# Patient Record
Sex: Female | Born: 1955
Health system: Southern US, Community
[De-identification: ages and names within clinical notes are randomized; demographics above are authoritative.]

## PROBLEM LIST (undated history)

## (undated) DIAGNOSIS — K5900 Constipation, unspecified: Secondary | ICD-10-CM

## (undated) DIAGNOSIS — M1990 Unspecified osteoarthritis, unspecified site: Secondary | ICD-10-CM

## (undated) DIAGNOSIS — G20A1 Parkinson's disease without dyskinesia, without mention of fluctuations: Secondary | ICD-10-CM

## (undated) DIAGNOSIS — R011 Cardiac murmur, unspecified: Secondary | ICD-10-CM

## (undated) DIAGNOSIS — I10 Essential (primary) hypertension: Secondary | ICD-10-CM

## (undated) DIAGNOSIS — F32A Depression, unspecified: Secondary | ICD-10-CM

## (undated) DIAGNOSIS — Z87442 Personal history of urinary calculi: Secondary | ICD-10-CM

## (undated) DIAGNOSIS — C4490 Unspecified malignant neoplasm of skin, unspecified: Secondary | ICD-10-CM

## (undated) DIAGNOSIS — Z86010 Personal history of colonic polyps: Secondary | ICD-10-CM

## (undated) DIAGNOSIS — H269 Unspecified cataract: Secondary | ICD-10-CM

## (undated) DIAGNOSIS — F410 Panic disorder [episodic paroxysmal anxiety] without agoraphobia: Secondary | ICD-10-CM

## (undated) DIAGNOSIS — I2510 Atherosclerotic heart disease of native coronary artery without angina pectoris: Secondary | ICD-10-CM

## (undated) DIAGNOSIS — U071 COVID-19: Secondary | ICD-10-CM

## (undated) DIAGNOSIS — S32050A Wedge compression fracture of fifth lumbar vertebra, initial encounter for closed fracture: Secondary | ICD-10-CM

## (undated) DIAGNOSIS — Z720 Tobacco use: Secondary | ICD-10-CM

## (undated) DIAGNOSIS — F329 Major depressive disorder, single episode, unspecified: Secondary | ICD-10-CM

## (undated) DIAGNOSIS — K219 Gastro-esophageal reflux disease without esophagitis: Secondary | ICD-10-CM

## (undated) DIAGNOSIS — T7840XA Allergy, unspecified, initial encounter: Secondary | ICD-10-CM

## (undated) DIAGNOSIS — E7800 Pure hypercholesterolemia, unspecified: Secondary | ICD-10-CM

## (undated) DIAGNOSIS — J449 Chronic obstructive pulmonary disease, unspecified: Secondary | ICD-10-CM

## (undated) DIAGNOSIS — N951 Menopausal and female climacteric states: Secondary | ICD-10-CM

## (undated) HISTORY — DX: Unspecified cataract: H26.9

## (undated) HISTORY — DX: Pure hypercholesterolemia, unspecified: E78.00

## (undated) HISTORY — DX: Atherosclerotic heart disease of native coronary artery without angina pectoris: I25.10

## (undated) HISTORY — PX: OTHER SURGICAL HISTORY: SHX169

## (undated) HISTORY — DX: Panic disorder (episodic paroxysmal anxiety): F41.0

## (undated) HISTORY — DX: COVID-19: U07.1

## (undated) HISTORY — PX: BACK SURGERY: SHX140

## (undated) HISTORY — DX: Unspecified malignant neoplasm of skin, unspecified: C44.90

## (undated) HISTORY — DX: Unspecified osteoarthritis, unspecified site: M19.90

## (undated) HISTORY — PX: ABDOMINAL HYSTERECTOMY: SHX81

## (undated) HISTORY — PX: UPPER GASTROINTESTINAL ENDOSCOPY: SHX188

## (undated) HISTORY — DX: Constipation, unspecified: K59.00

## (undated) HISTORY — DX: Tobacco use: Z72.0

## (undated) HISTORY — DX: Personal history of colonic polyps: Z86.010

## (undated) HISTORY — DX: Cardiac murmur, unspecified: R01.1

## (undated) HISTORY — DX: Major depressive disorder, single episode, unspecified: F32.9

## (undated) HISTORY — DX: Allergy, unspecified, initial encounter: T78.40XA

## (undated) HISTORY — PX: COLONOSCOPY: SHX174

## (undated) HISTORY — DX: Menopausal and female climacteric states: N95.1

## (undated) HISTORY — DX: Depression, unspecified: F32.A

## (undated) HISTORY — DX: Gastro-esophageal reflux disease without esophagitis: K21.9

---

## 1999-10-29 ENCOUNTER — Encounter (INDEPENDENT_AMBULATORY_CARE_PROVIDER_SITE_OTHER): Payer: Self-pay

## 1999-10-29 ENCOUNTER — Ambulatory Visit (HOSPITAL_COMMUNITY): Admission: RE | Admit: 1999-10-29 | Discharge: 1999-10-29 | Payer: Self-pay | Admitting: Gastroenterology

## 2001-01-22 ENCOUNTER — Ambulatory Visit (HOSPITAL_COMMUNITY): Admission: RE | Admit: 2001-01-22 | Discharge: 2001-01-22 | Payer: Self-pay | Admitting: Family Medicine

## 2001-01-22 ENCOUNTER — Encounter: Payer: Self-pay | Admitting: Family Medicine

## 2003-06-05 DIAGNOSIS — C4492 Squamous cell carcinoma of skin, unspecified: Secondary | ICD-10-CM

## 2003-06-05 DIAGNOSIS — C4491 Basal cell carcinoma of skin, unspecified: Secondary | ICD-10-CM

## 2003-06-05 DIAGNOSIS — D229 Melanocytic nevi, unspecified: Secondary | ICD-10-CM

## 2003-06-05 HISTORY — DX: Melanocytic nevi, unspecified: D22.9

## 2003-06-05 HISTORY — DX: Basal cell carcinoma of skin, unspecified: C44.91

## 2003-06-05 HISTORY — DX: Squamous cell carcinoma of skin, unspecified: C44.92

## 2003-07-26 DIAGNOSIS — D229 Melanocytic nevi, unspecified: Secondary | ICD-10-CM

## 2003-07-26 HISTORY — DX: Melanocytic nevi, unspecified: D22.9

## 2004-10-24 ENCOUNTER — Ambulatory Visit: Payer: Self-pay | Admitting: Cardiology

## 2006-05-15 ENCOUNTER — Other Ambulatory Visit: Admission: RE | Admit: 2006-05-15 | Discharge: 2006-05-15 | Payer: Self-pay | Admitting: Family Medicine

## 2006-06-22 ENCOUNTER — Observation Stay (HOSPITAL_COMMUNITY): Admission: EM | Admit: 2006-06-22 | Discharge: 2006-06-23 | Payer: Self-pay | Admitting: Emergency Medicine

## 2006-06-22 ENCOUNTER — Ambulatory Visit: Payer: Self-pay | Admitting: Cardiology

## 2006-07-20 ENCOUNTER — Encounter: Payer: Self-pay | Admitting: Cardiology

## 2006-07-20 ENCOUNTER — Ambulatory Visit: Payer: Self-pay | Admitting: Physician Assistant

## 2007-10-22 ENCOUNTER — Encounter: Payer: Self-pay | Admitting: Cardiology

## 2007-10-27 ENCOUNTER — Ambulatory Visit: Payer: Self-pay | Admitting: Cardiology

## 2007-11-11 ENCOUNTER — Ambulatory Visit (HOSPITAL_COMMUNITY): Payer: Self-pay | Admitting: Psychiatry

## 2007-11-24 ENCOUNTER — Ambulatory Visit (HOSPITAL_COMMUNITY): Payer: Self-pay | Admitting: Psychiatry

## 2008-11-10 ENCOUNTER — Encounter: Payer: Self-pay | Admitting: Cardiology

## 2008-11-10 DIAGNOSIS — F329 Major depressive disorder, single episode, unspecified: Secondary | ICD-10-CM

## 2008-11-10 DIAGNOSIS — F3289 Other specified depressive episodes: Secondary | ICD-10-CM | POA: Insufficient documentation

## 2008-11-10 DIAGNOSIS — M1990 Unspecified osteoarthritis, unspecified site: Secondary | ICD-10-CM

## 2008-11-10 DIAGNOSIS — E7800 Pure hypercholesterolemia, unspecified: Secondary | ICD-10-CM | POA: Insufficient documentation

## 2008-11-10 DIAGNOSIS — F17200 Nicotine dependence, unspecified, uncomplicated: Secondary | ICD-10-CM

## 2008-11-10 DIAGNOSIS — I10 Essential (primary) hypertension: Secondary | ICD-10-CM

## 2008-11-13 ENCOUNTER — Ambulatory Visit: Payer: Self-pay | Admitting: Cardiology

## 2008-11-13 DIAGNOSIS — G4700 Insomnia, unspecified: Secondary | ICD-10-CM

## 2008-11-13 DIAGNOSIS — M79609 Pain in unspecified limb: Secondary | ICD-10-CM

## 2008-11-15 ENCOUNTER — Encounter: Payer: Self-pay | Admitting: Cardiology

## 2008-12-04 ENCOUNTER — Encounter (INDEPENDENT_AMBULATORY_CARE_PROVIDER_SITE_OTHER): Payer: Self-pay | Admitting: *Deleted

## 2009-01-07 ENCOUNTER — Encounter: Payer: Self-pay | Admitting: Cardiology

## 2009-05-22 ENCOUNTER — Ambulatory Visit: Payer: Self-pay | Admitting: Cardiology

## 2009-05-22 DIAGNOSIS — R0789 Other chest pain: Secondary | ICD-10-CM

## 2009-08-21 ENCOUNTER — Telehealth (INDEPENDENT_AMBULATORY_CARE_PROVIDER_SITE_OTHER): Payer: Self-pay | Admitting: *Deleted

## 2009-08-23 ENCOUNTER — Ambulatory Visit (HOSPITAL_COMMUNITY): Admission: RE | Admit: 2009-08-23 | Discharge: 2009-08-23 | Payer: Self-pay | Admitting: Family Medicine

## 2009-08-30 ENCOUNTER — Ambulatory Visit (HOSPITAL_COMMUNITY): Admission: RE | Admit: 2009-08-30 | Discharge: 2009-08-30 | Payer: Self-pay | Admitting: Family Medicine

## 2009-10-10 ENCOUNTER — Encounter: Payer: Self-pay | Admitting: Cardiology

## 2009-10-10 ENCOUNTER — Telehealth (INDEPENDENT_AMBULATORY_CARE_PROVIDER_SITE_OTHER): Payer: Self-pay | Admitting: *Deleted

## 2009-10-10 ENCOUNTER — Encounter (INDEPENDENT_AMBULATORY_CARE_PROVIDER_SITE_OTHER): Payer: Self-pay | Admitting: *Deleted

## 2009-10-11 ENCOUNTER — Inpatient Hospital Stay (HOSPITAL_COMMUNITY): Admission: EM | Admit: 2009-10-11 | Discharge: 2009-10-11 | Payer: Self-pay | Admitting: Emergency Medicine

## 2009-10-11 ENCOUNTER — Encounter (INDEPENDENT_AMBULATORY_CARE_PROVIDER_SITE_OTHER): Payer: Self-pay | Admitting: *Deleted

## 2009-10-11 ENCOUNTER — Encounter: Payer: Self-pay | Admitting: Cardiology

## 2009-10-11 ENCOUNTER — Ambulatory Visit: Payer: Self-pay | Admitting: Cardiology

## 2009-10-16 ENCOUNTER — Telehealth (INDEPENDENT_AMBULATORY_CARE_PROVIDER_SITE_OTHER): Payer: Self-pay | Admitting: *Deleted

## 2009-12-14 ENCOUNTER — Ambulatory Visit: Payer: Self-pay | Admitting: Cardiology

## 2010-04-02 NOTE — Progress Notes (Signed)
Summary: breathing issue  Phone Note Call from Patient   Summary of Call: Pt. called c/o fluid and not breathing good.  Stated she did see her PMD Paulita Cradle) on Friday for these issues and she ordered sonogram on belly for Thursday.  Stated that a friend of hers give her a fluid pill of hers and she said she lost about 7 lbs.  Advised her that it is not a good idea to take other people's meds because they may not be what she needs or agree with other meds that she may be taking.  Advised her to call PMD back to inform her of this as she has already started addressing the problem.  Also, advised her that she should call PMD this afternoon about this info discussed above.  If her PMD feels like this is a cardiac issue, then she can call office back to request appt.  If symptoms worsern, ER.  Patient verbalized understanding.  Initial call taken by: Hoover Brunette, LPN,  August 21, 2009 4:28 PM  Follow-up for Phone Call        Agree Lewayne Bunting, MD, [facility]  August 22, 2009 10:39 AM

## 2010-04-02 NOTE — Letter (Signed)
Summary: [facility] D/C DR.DONALD MOORE/DR. KRISHAN  [facility] D/C DR.DONALD MOORE/DR. KRISHAN   Imported By: Zachary George 12/14/2009 13:45:15  _____________________________________________________________________  External Attachment:    Type:   Image     Comment:   External Document

## 2010-04-02 NOTE — Assessment & Plan Note (Signed)
Summary: elevated bp  --agh   Visit Type:  Follow-up Primary [name]:  Kerry Cradle, NP, [facility]  CC:  Blood pressure running high.  History of Present Illness: the patient is a 55 year old female with chronic dyspnea secondary to be a COPD associated with chronic cough. Has atypical chest pain. She was recently seen in endocrine emergency room because of uncontrolled hypertension. An MRI of the head was done which was essentially normal had an echocardiogram done which was normal also a carotid Doppler lung which were within normal limits.  She presents now for followup. She denies any chest pain orthopnea PND. She continues to have dyspnea and exertion but continues to smoke a pack a day.  Preventive Screening-Counseling & Management  Alcohol-Tobacco     Smoking Status: current     Packs/Day: 1.5-2.0  Comments: smoked for about 40 yrs  Current Medications (verified): 1)  Aspirin 81 Mg  Tabs (Aspirin) .... Daily 2)  Lisinopril 20 Mg Tabs (Lisinopril) .... Daily 3)  Vitamin E 600 Unit  Caps (Vitamin E) .... Daily 4)  Clonazepam .... As Needed 5)  Cymbalta 60 Mg Cpep (Duloxetine Hcl) .... Take 1 Tablet By Mouth Once A Day 6)  Lipitor 40 Mg Tabs (Atorvastatin Calcium) .... Take One Tablet By Mouth At Bedtime 7)  Fish Oil 1000 Mg Caps (Omega-3 Fatty Acids) .... Take 1 Capsule By Mouth Once A Day 8)  Trazodone Hcl 50 Mg Tabs (Trazodone Hcl) .... Take 1 Tab By Mouth At Bedtime  Allergies: No Known Drug Allergies  Comments:  Nurse/Medical Assistant: The patient's medications were reviewed with the patient and were updated in the Medication List. Pt verbally confirmed medications.  Cyril Loosen, RN, BSN (December 14, 2009 3:05 PM)  Past History:  Past Medical History:  1. Hypertension.  2. Hypercholesterolemia.  3. Depression.  4. Arthritis.  5. Stress test 8 years ago.  6. Status post hysterectomy.  7. Status post vocal cord polyp removal.  8. Ongoing tobacco use.  9.  She is also perimenopausal.  1. Minimal nonobstructive coronary artery disease. 2. Normal left ventricular function. 3. Panic attacks with anxiety and depression. 4. Dyslipidemia. 5. Hypertension, controlled. 6. Tobacco use.  Echocardiogram August 2011 normal LV systolic function ejection fraction 65% mild pulmonary hypertension PA systolic pressure 42 mm of mercury. No significant Doppler abnormalities Carotid Dopplers August 2011 no evidence of hemodynamically significant stenosis MRI MRA of the head August 2011 mild chronic microvascular ischemia negative for cerebral aneurysm.  Social History: Packs/Day:  1.5-2.0  Review of Systems       The patient complains of dyspnea on exertion.  The patient denies anorexia, fever, weight loss, weight gain, vision loss, decreased hearing, hoarseness, chest pain, syncope, peripheral edema, prolonged cough, headaches, hemoptysis, abdominal pain, melena, hematochezia, severe indigestion/heartburn, hematuria, incontinence, genital sores, muscle weakness, suspicious skin lesions, transient blindness, difficulty walking, depression, unusual weight change, abnormal bleeding, enlarged lymph nodes, angioedema, breast masses, and testicular masses.    Vital Signs:  Patient profile:   55 year old female Height:      65 inches Weight:      169 pounds BMI:     28.22 Pulse rate:   82 / minute BP sitting:   125 / 72  (left arm) Cuff size:   regular  Vitals Entered By: Cyril Loosen, RN, BSN (December 14, 2009 3:02 PM)  Nutrition Counseling: Patient's BMI is greater than 25 and therefore counseled on weight management options. CC: Blood pressure running high  Physical Exam  Additional Exam:  General: Well-developed, well-nourished in no distress head: Normocephalic and atraumatic eyes PERRLA/EOMI intact, conjunctiva and lids normal nose: No deformity or lesions mouth normal dentition, normal posterior pharynx neck: Supple, no JVD.  No masses,  thyromegaly or abnormal cervical nodes lungs: Decreased breath sounds bilaterally without wheezing..  Normal percussion heart: regular rate and rhythm with normal S1 and S2, no S3 or S4.  PMI is normal.  No pathological murmurs abdomen: Normal bowel sounds, abdomen is soft and nontender without masses, organomegaly or hernias noted.  No hepatosplenomegaly musculoskeletal: Back normal, normal gait muscle strength and tone normal pulsus: Pulse is normal in all 4 extremities Extremities: No peripheral pitting edema neurologic: Alert and oriented x 3 skin: Intact without lesions or rashes cervical nodes: No significant adenopathy psychologic: Normal affect    EKG  Procedure date:  12/14/2009  Findings:      normal sinus rhythm septal infarct pattern no acute ischemic changes  Impression & Recommendations:  Problem # 1:  CHEST PAIN, ATYPICAL (ICD-786.59) EKG within normal limits no ischemia . No recurrent angina symptoms.  Her updated medication list for this problem includes:    Aspirin 81 Mg Tabs (Aspirin) .Marland Kitchen... Daily    Lisinopril 20 Mg Tabs (Lisinopril) .Marland Kitchen... Daily  Orders: EKG w/ Interpretation (93000)  Problem # 2:  BRONCHITIS, CHRONIC (ICD-491.9) I advised the patient again to stop smoking. Her dyspnea is due to  COPD  Problem # 3:  ANXIETY DEPRESSION (ICD-300.4) Assessment: Comment Only  Problem # 4:  HYPERCHOLESTEROLEMIA (ICD-272.0) The patient is on statin drug therapy. However she has minimal nonobstructive coronary artery disease. Her updated medication list for this problem includes:    Lipitor 40 Mg Tabs (Atorvastatin calcium) .Marland Kitchen... Take one tablet by mouth at bedtime  Patient Instructions: 1)  Your physician recommends that you continue on your current medications as directed. Please refer to the Current Medication list given to you today. 2)  Follow up in  1 year

## 2010-04-02 NOTE — Assessment & Plan Note (Signed)
Summary: 6 month fu recv reminder vs   Visit Type:  Follow-up Primary Provider:  Paulita Cradle, NP, [facility]  CC:  follow-up visit.  History of Present Illness: the patient is a 55 year old female with chronic dyspnea secondary to COPD. She reports a chronic cough. She is a strip atypical chest pain. Shows multiple cardiac risk factors and continues to smoke. Prior catheterization however showed minimal nonobstructive coronary artery disease. The patient on lipid-lowering therapy. She also has history of anxiety and depression with insomnia. She was unable to take trazodone secondary to significant nightmares. She has been given clonazepam by Dr. Mamie Laurel. Her insomnia has improved. She denies any exertional chest pain orthopnea or PND.   Preventive Screening-Counseling & Management  Alcohol-Tobacco     Smoking Status: current     Smoking Cessation Counseling: yes     Packs/Day: 1 PPD  Current Medications (verified): 1)  Aspirin 81 Mg  Tabs (Aspirin) .... Daily 2)  Lisinopril 20 Mg Tabs (Lisinopril) .... Daily 3)  Vitamin E 600 Unit  Caps (Vitamin E) .... Daily 4)  Clonazepam .... As Needed 5)  Cymbalta 60 Mg Cpep (Duloxetine Hcl) .... Take 1 Tablet By Mouth Once A Day 6)  Lipitor 40 Mg Tabs (Atorvastatin Calcium) .... Take One Tablet By Mouth At Bedtime 7)  Fish Oil 1000 Mg Caps (Omega-3 Fatty Acids) .... Take 1 Capsule By Mouth Once A Day 8)  Trazodone Hcl 50 Mg Tabs (Trazodone Hcl) .... Take 1 Tab By Mouth At Bedtime  Allergies (verified): No Known Drug Allergies  Comments:  Nurse/Medical Assistant: The patient is currently on medications but does not know the name or dosage at this time. Instructed to contact our office with details. Will update medication list at that time.  Family History: Reviewed history from 11/10/2008 and no changes required.  Strong family history.  Mother deceased secondary to  coronary artery disease, underwent bypass surgery at age 53.  Father  deceased at age 64 secondary to acute MI with known history of coronary  artery disease.  She has a brother deceased secondary to coronary artery  disease, MI in a brother, deceased secondary to colon cancer.  Social History: Reviewed history from 11/10/2008 and no changes required.  She lives in [place] with her husband. She is a Futures trader.  She has 2 adult children.  She smokes 2 packs of cigarettes a day and  has done so for about 30 years.  She denies any ETOH, drug or overall  medication use.Packs/Day:  1 PPD  Review of Systems       The patient complains of shortness of breath and anxiety.  The patient denies fatigue, malaise, fever, weight gain/loss, vision loss, decreased hearing, hoarseness, chest pain, palpitations, prolonged cough, wheezing, sleep apnea, coughing up blood, abdominal pain, blood in stool, nausea, vomiting, diarrhea, heartburn, incontinence, blood in urine, muscle weakness, joint pain, leg swelling, rash, skin lesions, headache, fainting, dizziness, depression, enlarged lymph nodes, easy bruising or bleeding, and environmental allergies.    Vital Signs:  Patient profile:   55 year old female Height:      65 inches Weight:      165 pounds Pulse rate:   88 / minute BP sitting:   122 / 79  (left arm) Cuff size:   regular  Vitals Entered By: Carlye Grippe (May 22, 2009 11:13 AM) CC: follow-up visit   Physical Exam  Additional Exam:  General: Well-developed, well-nourished in no distress head: Normocephalic and atraumatic eyes PERRLA/EOMI intact, conjunctiva  and lids normal nose: No deformity or lesions mouth normal dentition, normal posterior pharynx neck: Supple, no JVD.  No masses, thyromegaly or abnormal cervical nodes lungs: Decreased breath sounds bilaterally without wheezing..  Normal percussion heart: regular rate and rhythm with normal S1 and S2, no S3 or S4.  PMI is normal.  No pathological murmurs abdomen: Normal bowel sounds, abdomen is soft and  nontender without masses, organomegaly or hernias noted.  No hepatosplenomegaly musculoskeletal: Back normal, normal gait muscle strength and tone normal pulsus: Pulse is normal in all 4 extremities Extremities: No peripheral pitting edema neurologic: Alert and oriented x 3 skin: Intact without lesions or rashes cervical nodes: No significant adenopathy psychologic: Normal affect    Impression & Recommendations:  Problem # 1:  TOBACCO ABUSE (ICD-305.1) the patient was extensively counseled her regarding her tobacco use. She's trying electronic cigarettes.  Problem # 2:  ANXIETY DEPRESSION (ICD-300.4) followup Dr. Mamie Laurel  Problem # 3:  LEG PAIN, BILATERAL (ICD-729.5) no evidence of peripheral vascular disease by ABIs.  Problem # 4:  CHEST PAIN, ATYPICAL (ICD-786.59) nonobstructive coronary disease by catheterization. Continue risk factor modification. Her updated medication list for this problem includes:    Aspirin 81 Mg Tabs (Aspirin) .Marland Kitchen... Daily    Lisinopril 20 Mg Tabs (Lisinopril) .Marland Kitchen... Daily  Patient Instructions: 1)  Your physician recommends that you continue on your current medications as directed. Please refer to the Current Medication list given to you today. 2)  Follow up in  1 year.

## 2010-04-02 NOTE — Letter (Signed)
Summary: Discharge Summary  Discharge Summary   Imported By: Dorise Hiss 10/16/2009 16:44:51  _____________________________________________________________________  External Attachment:    Type:   Image     Comment:   External Document

## 2010-04-02 NOTE — Progress Notes (Signed)
Summary: elevated bp  Phone Note Call from Patient   Summary of Call: Request to see GD for elevated blood pressure.  Stated she did go to ED for evaluation and was told she was having panic attacks.  Stated she went back again to [facility] and was told she was having migrain and to f/u with PMD.  Advised pt. to see PMD in the meantime as GD first available is not until 10/14.  If PMD feels she needs to be seen sooner than this, they can call to request.  OV scheduled for 10/14 at 2:00.  Notes scanned in from [place] [facility] for you review. Hoover Brunette, LPN  October 16, 2009 4:14 PM   Follow-up for Phone Call        OK Follow-up by: Lewayne Bunting, MD, [facility],  October 21, 2009 10:43 AM

## 2010-04-02 NOTE — Progress Notes (Signed)
Summary: elevated bp  Phone Note Call from Patient   Summary of Call: Patient called c/o bp being elevated last couple of days.  States she now has tingling in her left arm and funny feeling up into her neck.  Does also c/o dizziness.  Advised  her to go to ED for evaluation as GD is out of the office this week.  Patient verbalized understanding.  Initial call taken by: Hoover Brunette, LPN,  October 10, 2009 1:41 PM

## 2010-05-17 LAB — BASIC METABOLIC PANEL
CO2: 27 mEq/L (ref 19–32)
Calcium: 9.4 mg/dL (ref 8.4–10.5)
Chloride: 105 mEq/L (ref 96–112)
Glucose, Bld: 92 mg/dL (ref 70–99)
Potassium: 3.7 mEq/L (ref 3.5–5.1)
Sodium: 140 mEq/L (ref 135–145)

## 2010-05-17 LAB — CBC
HCT: 40.2 % (ref 36.0–46.0)
Hemoglobin: 13.8 g/dL (ref 12.0–15.0)
MCH: 31.7 pg (ref 26.0–34.0)
MCV: 92.3 fL (ref 78.0–100.0)
Platelets: 311 10*3/uL (ref 150–400)
RBC: 4.36 MIL/uL (ref 3.87–5.11)
WBC: 10.6 10*3/uL — ABNORMAL HIGH (ref 4.0–10.5)

## 2010-05-17 LAB — URINALYSIS, ROUTINE W REFLEX MICROSCOPIC
Glucose, UA: NEGATIVE mg/dL
Ketones, ur: NEGATIVE mg/dL
Leukocytes, UA: NEGATIVE
pH: 6 (ref 5.0–8.0)

## 2010-05-17 LAB — LIPID PANEL: Triglycerides: 157 mg/dL — ABNORMAL HIGH (ref <150)

## 2010-05-17 LAB — POCT CARDIAC MARKERS
CKMB, poc: <1 ng/mL — ABNORMAL LOW (ref 1.0–8.0)
Myoglobin, poc: 74.7 ng/mL (ref 12–200)
Troponin i, poc: <0.05 ng/mL (ref 0.00–0.09)

## 2010-05-17 LAB — DIFFERENTIAL
Eosinophils Absolute: 0.1 10*3/uL (ref 0.0–0.7)
Eosinophils Relative: 1 % (ref 0–5)
Lymphocytes Relative: 35 % (ref 12–46)
Lymphs Abs: 3.7 10*3/uL (ref 0.7–4.0)
Monocytes Relative: 6 % (ref 3–12)

## 2010-05-17 LAB — PROTIME-INR
INR: 0.94 (ref 0.00–1.49)
Prothrombin Time: 12.8 seconds (ref 11.6–15.2)

## 2010-05-17 LAB — MRSA PCR SCREENING

## 2010-05-17 LAB — URINE MICROSCOPIC-ADD ON

## 2010-05-17 LAB — D-DIMER, QUANTITATIVE

## 2010-07-16 NOTE — Assessment & Plan Note (Signed)
[facility]                          [facility] NOTE   BRYNLYN, DADE                         MRN:          [id number]  DATE:10/27/2007                            DOB:          03-19-1955    HISTORY OF PRESENT ILLNESS:  The patient is a 55 year old female with a  history of nonobstructive coronary artery disease.  Today in the office,  the patient is extremely anxious and states that she has gone through  several panic attacks.  She was given Cymbalta by Ms. Bevelyn Buckles recently,  but the patient did not fill this until last Thursday.  On Friday when  she took her first pill, she had a what appears to be a major panic  attack and had to come to the emergency room.  Unfortunately, her  Cymbalta was then discontinued and the patient was told to take Xanax.  However, since taking Xanax, the patient is on a roller coaster ride  with her anxiety and appears extremely depressed in the office  requesting help for her problem.  At this point, she is not sure how to  manage things.  She also feels that the Cymbalta, although she only took  one dose, was contributing to her problems.  I have placed a call to Ms.  Bevelyn Buckles to discuss this and gave the recommendation that we at least  for 2 weeks should cover the patient with Klonopin, but certainly keep  her on the Cymbalta.  It may take several weeks before the medications  will take effect.  Dr. Bevelyn Buckles agreed with this plan and she asked me  personally if I could write a prescription for Klonopin, and then she  would see the patient in the meanwhile.  I also cut the patient's  Cymbalta to 30 mg a day for 1 week to minimize side effects and then to  be increased to 60 mg p.o. daily.  Next, from a cardiac perspective, the  patient is actually doing well.  She has no chest pain or shortness of  breath.   MEDICATIONS:  1. Aspirin 81 mg p.o. daily.  2. Vitamin E.  3. Lisinopril 20 mg p.o. daily.  4. Premarin 0.625 daily.  5. Xanax 1 tablet p.o. daily although the patient does appear to take      it more often.   PHYSICAL EXAMINATION:  VITAL SIGNS:  Blood pressure is 105/69, heart  rate 74, and weight is 145 pounds.  NECK:  Normal carotid upstrokes.  LUNGS:  Clear.  HEART:  Regular rate and rhythm.  Normal S1, S2.  ABDOMEN:  Soft.  EXTREMITIES:  No cyanosis, clubbing, or edema.   PROBLEM LIST:  1. Minimal nonobstructive coronary artery disease.  2. Normal left ventricular function.  3. Panic attacks with anxiety and depression.  4. Dyslipidemia.  5. Hypertension, controlled.  6. Tobacco use.   PLAN:  1. The plan is as outlined above, and this has been discussed      carefully with Ms. Bevelyn Buckles [facility].      We will stop the patient's  Xanax and replace it by Klonopin 0.25      b.i.d. times several days and then 0.5 mg p.o. b.i.d.  Cut her      Cymbalta to 30 mg p.o. daily and then increase it after several      days to 60 mg p.o. daily.  The patient has been clearly advised to      avoid operating any heavy machinery or do any driving.  2. The patient has also been instructed about the side effects of      Cymbalta and that they usually tend to tend go away over time.  I      will not provide any further management of the patient's anxiety or      depression, but given her poor emotional state in the office today,      I felt obligated to help the patient with the treatment and again      this was carefully discussed with Ms. Steadman.     Learta Codding, MD,FACC  Electronically Signed    GED/MedQ  DD: 10/27/2007  DT: 10/28/2007  Job #: [id number]   cc:   Paulita Cradle, FNP

## 2010-07-16 NOTE — Assessment & Plan Note (Signed)
[facility]                          [facility] NOTE   NAME:[name], [name]                         MRN:          [id number]  DATE:07/20/2006                            DOB:          [birth year]    CARDIOLOGIST:  The patient will be new to Dr. Andee Lineman.   PRIMARY CARE PHYSICIAN:  Paulita Cradle, Nurse Practitioner at [facility].   HISTORY OF PRESENT ILLNESS:  [name] is a 55 year old female patient  who follows up today post catheterization.  She was seen at [facility] after presenting with chest discomfort.  Her cardiac  catheterization was done by Dr. Tonny Bollman.  It showed  nonobstructive disease with 30 to 40% stenosis in the mid left  circumflex and minor luminal irregularities in the proximal portion of  the right coronary artery.  She was enrolled in the [employer] trial and had  IVUS of her right coronary artery.  The patient tells me she was  contacted by [employer] and told that she did not qualify for continuation  in this trial.  She is no longer on the [employer] cholesterol medication  protocol.  She returns today for followup.  She is doing well.  She  denies any chest pain, shortness of breath or syncope.  Her right groin  was somewhat tender after discharge but she is feeling well now.  She  does complain of some right lower extremity pain that has been ongoing  for several months now.  She thought it was maybe secondary to a  fracture of her ankle that she suffered some years ago.  She does note  some exertional pain, especially around her hip and the back of her calf  that gets worse with exertion and improves with rest.   CURRENT MEDICATIONS:  She is unsure of her medications.  She does take  ASA 81 mg daily, Lipitor 20 mg a day, vitamin E, Lexapro 40 mg a day,  arthritis pill, hormone pill.   ALLERGIES:  No known drug allergies.   PHYSICAL EXAMINATION:  GENERAL APPEARANCE:  She is a  well-nourished,  well-developed female in no acute distress.  VITAL SIGNS:  Blood pressure 111/75, pulse 59, weight 151.6 pounds.  HEENT:  Normal.  Carotids are without bruits bilaterally.  NECK:  Without jugular venous distention.  CARDIAC:  Normal S1, S2, regular rate and rhythm without murmurs.  LUNGS:  Clear to auscultation bilaterally without wheezing, rhonchi or  rales.  ABDOMEN:  Soft, nontender with normal bowel sounds.  No  hepatosplenomegaly.  EXTREMITIES:  Without edema.  Calves soft and nontender.  VASC: Distal pulses are difficult to palpate.  No femoral artery bruits  noted bilaterally.  Right femoral arteriotomy site is soft without  hematoma or bruits.  SKIN:  Warm and dry.   CLINICAL DATA:  Electrocardiogram reveals sinus bradycardia with heart  rate of 57, normal axis, no acute changes.   IMPRESSION:  1. Minimal nonobstructive coronary disease by recent cardiac      catheterization as noted above.  2. Good left ventricular function.  3. Recent history of noncardiac chest pain, resolved.  4. Anxiety/depression.  5. Treated dyslipidemia.  6. History of hypertension.  7. Arthritis.  8. Tobacco abuse.  9. Right lower extremity exertional pain.   PLAN:  The patient returns to the office today for post hospitalization  followup.  She does have minimal plaquing in her left circumflex.  She  will need continued aggressive risk factor modification.  She has her  cholesterol followed by Paulita Cradle.  She can continue followup with  her for this.  Her goal LDL will be less than 70.  Her blood pressure  looks good today.  I have counseled her briefly on tobacco cessation.  She does take Lexapro.  I have suggested that she talk with Paulita Cradle further about the possibility of starting her on Chantix with  close monitoring of symptoms and side effects.  I have also given her a  plan to cut down on her smoking.  She does have some right lower  extremity pain.  This  is sometimes brought on by exertion.  I have  recommended that we set her up with bilateral lower extremity arterial  Doppler's and ABI's.  If she has any significant findings on this will  have her follow up sooner.  Otherwise, we will bring her back in routine  followup in 12 months.  She can follow up further for risk  stratification with Paulita Cradle for blood pressure, cholesterol,  smoking cessation, diet and exercise.      Tereso Newcomer, PA-C  Electronically Signed      Luis Abed, MD, [facility]  Electronically Signed   SW/MedQ  DD: 07/20/2006  DT: 07/20/2006  Job #: [id number]   cc:   [facility] Paulita Cradle, [place].P.

## 2010-07-19 NOTE — H&P (Signed)
[name], TEMPESTA NO.:  [id number]   MEDICAL RECORD NO.:  [id number]          PATIENT TYPE:  INP   LOCATION:  2007                         FACILITY:  [facility]   PHYSICIAN:  Dorian Pod, ACNP  DATE OF BIRTH:  11-21-1955   DATE OF ADMISSION:  06/22/2006  DATE OF DISCHARGE:  03/26/2006                              HISTORY & PHYSICAL   CARDIOLOGIST:  The patient was seen about 8-10 years ago in [place] at our  office there.  She will be followed up with Dr. Learta Codding, MD,FACC.   PRIMARY CARE [name]:  Kerry Cradle, FNP at [facility] [name].   HISTORY OF PRESENT ILLNESS:  [name] is a 55 year old Caucasian  female, who states she underwent a stress test about 8-10 years ago  secondary to mild dyspnea at that time.  She states she was told  everything was okay, had no further problems until approximately 1 month  ago, began to experience increased dyspnea with minimal exertion, also  began having some substernal chest heaviness and discomfort down her  left arm with minimal exertion also.  This has been going on for about 4-  5 weeks.  The patient really did not think too much of it, continued to  do her housework without problems.  She has been under a great emotional  stress.  She has a 51 -year-old son who has been addicted to  crack/cocaine, has been clean for approximately 1 year but has caused  significant stress in family during this time as he has 2 small children  and Ms. Knack worries about the affect on the children.  Apparently,  yesterday, her son did not come home and his wife was very upset and  called [name], who, in turn, became very upset and distraught and  was afraid her son had resorted to the crack/cocaine again.  She got in  her car and physically went searching for him, apparently found him  today at a friend's house.  He had been drinking.  She got into an  argument with her son, became very emotionally  distraught, had sudden  onset of substernal chest discomfort.  She states it felt like someone  was standing on her chest in the sternal area.  She became very  diaphoretic, dizzy, nauseated.  The discomfort was around a 7 on a scale  of 1-10. It radiated down her left arm.  She drove herself to [facility], was given Nitroglycerin.  It took 3 tablets  to relieve her discomfort from a 7 to a 4.  She was also given baby  aspirin.  EMS was notified.  The patient's blood pressure initially  198/132.  Chest discomfort continued around 4-5.  The patient was  transported to [facility] for further evaluation.  Currently denies any  sternal discomfort.  She is still having left arm and shoulder ache.  She also complains of increased fatigue over the last 2 months,  increased shortness of breath, dyspnea on exertion, insomnia.  She  states she has previously put on antidepressants  to help her cope  better.  Previously was on Lexapro.  She stopped this secondary to  financial issues, has not taken it in several months.  She states she  was just started on Lipitor  3 weeks ago.  She has also been started on  a hormone pill and arthritis pill.   ALLERGIES:  No known drug allergies.   PAST MEDICAL HISTORY:  1. Hypertension.  2. Hypercholesterolemia.  3. Depression.  4. Arthritis.  5. Stress test 8 years ago.  6. Status post hysterectomy.  7. Status post vocal cord polyp removal.  8. Ongoing tobacco use.  9. She is also perimenopausal.   SOCIAL HISTORY:  She lives in [place] with her husband. She is a Futures trader.  She has 2 adult children.  She smokes 2 packs of cigarettes a day and  has done so for about 30 years.  She denies any ETOH, drug or overall  medication use.   DIET:  She is on a regular diet.   FAMILY HISTORY:  Strong family history.  Mother deceased secondary to  coronary artery disease, underwent bypass surgery at age 64.  Father  deceased at age 19  secondary to acute MI with known history of coronary  artery disease.  She has a brother deceased secondary to coronary artery  disease, MI in a brother, deceased secondary to colon cancer.   REVIEW OF SYSTEMS:  Positive for chills, headache, insomnia, chest pain,  shortness of breath, dyspnea on exertion, orthopnea, dizziness,  lightheadedness, cough, wheezing, perimenopausal, generalized weakness,  depression, anxiety, myalgia, arthralgia, pain in hands and feet,  recently diagnosed with arthritis.  Positive for nausea, GERD symptoms  and decreased appetite.   PHYSICAL EXAMINATION:  VITAL SIGNS:  Temperature 97.7, pulse 69,  respirations 18, blood pressure 136/71, currently 118/61, 98% on 2  liters.  GENERALLY:  She is in no acute distress.  She has a very flat affect,  appears much older than stated age, very somnolent and normocephalic,  atraumatic.  HEENT: Pupils equal, round and reactive to light.  Sclera is clear.  NECK:  Supple, without lymphadenopathy.  No bruits, no JVD.  CARDIOVASCULAR EXAM:  S1, S2, regular rate and rhythm.  LUNGS:  Clear to auscultation but distant breath sounds bilateral bases  with poor inspiratory effort.  SKIN:  Warm and dry.  ABDOMEN:  Soft, nontender.  Positive bowel sounds.  EXTREMITIES:  Lower extremities without cyanosis, clubbing or edema.  NEUROLOGIC:  The patient is oriented x 3.   LABORATORY DATA:  CHEST X-RAY:  Without acute findings. EKG showing  normal sinus rhythm at a rate of 70.   H&H 13.9 and 40.  Sodium 143, potassium 4.3, chloride 111, BUN 8,  creatinine 0.7, with glucose of 87. Point of care markers:  Troponin  less than 0.05.  PT/INR 13.5 and 1.0. UA is negative.   IMPRESSION:  1. Chest pain concerning for unstable angina.  2. Hypertension.  3. Hypercholesterolemia.  4. Ongoing tobacco use.  5. Depression/anxiety.  6. Premature coronary artery disease in family.   PLAN:  1. Dr. Lalla Brothers in to examine the patient. 2. The  patient will be admitted. Rule out MI.  3. Will proceed with cardiac catheterization in a.m..  4. Initiate cardiac rehab.  5. Smoking cessation, education.  6. Check fasting lipids.  7. Resume the patient's Lexapro.  8. Increase her nitroglycerin drip which she is already on for      complete resolution of chest/arm discomfort and initiate  anticoagulation therapy with heparin.  9. Low dose beta blocker as tolerated.      Dorian Pod, ACNP     MB/MEDQ  D:  06/22/2006  T:  06/23/2006  Job:  [id number]

## 2010-07-19 NOTE — Discharge Summary (Signed)
[name], FAKE NO.:  [id number]   MEDICAL RECORD NO.:  [id number]          PATIENT TYPE:  INP   LOCATION:  2007                         FACILITY:  [facility]   PHYSICIAN:  Christell Faith, MD   DATE OF BIRTH:  1955-04-03   DATE OF ADMISSION:  06/22/2006  DATE OF DISCHARGE:  06/23/2006                               DISCHARGE SUMMARY   PRIMARY CARDIOLOGIST:  Dr. Lewayne Bunting.   PRIMARY CARE PHYSICIAN:  Paulita Cradle, Family Nurse Practitioner at  [facility].   PROCEDURES PERFORMED DURING HOSPITALIZATION:  1. Cardiac catheterization, dated June 23, 2006, per Dr. Tonny Bollman.      a.     Normal systolic function.  Left ventricular ejection       fraction 55% without mitral regurgitation.      b.     Nonobstructive coronary artery disease without obstructive       disease in the left circumflex predominantly.      c.     Normal left ventricular function with medical treatment       only.      d.     Matt Holmes trial IVUS at the right coronary artery performed.   DISCHARGE DIAGNOSES:  1. Chest pain.  Status post cardiac catheterization.  2. Situational anxiety and depression.  3. Hypercholesterolemia.  4. History of hypertension.  5. History of arthritis.  6. Ongoing tobacco abuse.  7. Perimenopause.   HISTORY OF PRESENT ILLNESS:  This 55 year old Caucasian female who was  admitted from [facility] secondary to substernal  chest pain.  The patient had sudden onset of substernal chest  discomfort, feeling like someone was standing on her chest and became  diaphoretic, dizzy and nauseated on day of admission.  The patient  apparently became very upset and distraught after finding her son  drinking.  The patient's son has a history of cocaine and other drug  abuse and he had not come home.  She became very upset, thinking that he  had returned to his drug use as he had been sober for about a year.  Upon  finding her son, the patient found him drinking with friends and  got into an argument with him and became emotionally distraught.  As a  result of this, she experienced the chest discomfort as described.   The patient drove to [facility] and was seen by  the physicians there.  She was given baby aspirin and nitroglycerin  sublingual.  EMS was called and the patient was transferred to [facility].   The patient was seen and examined by Dorian Pod, nurse  practitioner.  The patient was also seen by Dr. Lalla Brothers who is cardiac  fellow under Dr. Dietrich Pates.  The patient was admitted, started on  nitroglycerin and heparin and planned for cardiac catheterization the  following a.m.   The patient did undergo cardiac catheterization as discussed above.  Please see Dr. Earmon Phoenix thorough dictation for more details.  The  patient did undergo an intravascular ultrasound in  the right coronary  artery as part of the Saturn study.  She had enrolled in that during  this hospitalization and will be followed by research.   Postcatheterization, the patient was found to be stable with no further  complaints of chest discomfort.  Right groin site was clean and dry and  without evidence of hematoma, bleeding or bruit.  The patient was seen  and examined by Dr. Jeralene Peters and found to be stable for  discharge.      Bettey Mare. Lyman Bishop, NP      Christell Faith, MD  Electronically Signed    KML/MEDQ  D:  06/23/2006  T:  06/23/2006  Job:  [id number]

## 2010-07-19 NOTE — Cardiovascular Report (Signed)
[name], RULAND NO.:  [id number]   MEDICAL RECORD NO.:  [id number]          PATIENT TYPE:  INP   LOCATION:  2007                         FACILITY:  [facility]   PHYSICIAN:  Veverly Fells. Excell Seltzer, MD  DATE OF BIRTH:  [birth year]   DATE OF PROCEDURE:  06/23/2006  DATE OF DISCHARGE:                            CARDIAC CATHETERIZATION   PROCEDURE:  1. Left heart catheterization.  2. Selective coronary angiography.  3. Left ventricular angiography.  4. Intracoronary vascular ultrasound of the right coronary artery.  5. StarClose of the right femoral artery.   INDICATIONS:  [name] is a 55 year old woman who presented with chest  pain in the setting of an argument.  She has multiple cardiac risk  factors and was referred for cardiac catheterization.   Risks and indications of the procedure were explained to the patient.  Informed consent was obtained.  The patient was consented to the SATURN  trial, which is an IVUS study evaluating plaque regression with statin  therapy.  The right groin was prepped and draped under normal sterile  conditions. Using the modified Seldinger technique, a 6-French sheath  was placed in the right femoral artery.  Multiple views of the left and  right coronary arteries were taken using standard Judkins preformed  catheters. Following elective coronary angiography, an angled pigtail  catheter was inserted into the left ventricle, and pressures were  recorded.  A left ventriculogram was performed. A pullback across the  aortic valve was done. At the conclusion of the diagnostic angiogram,  and IVUS procedure was performed on the right coronary artery.  Heparin  was given for anticoagulation at a dose of 50 units per kg. Once a  therapeutic ACT was achieved, a JR-4 side-hole guide catheter was  inserted, and a Cougar guidewire was passed into the distal portion of  the vessel.  An additional 1000 units of heparin was given as the ACT  was  only 230 seconds.  Intracoronary nitroglycerin was given, and the  IVUS catheter was inserted into the distal vessel.  An automated  pullback was performed.  Following the IVUS, a final angiographic image  of the right coronary artery was taken with the wire and IVUS probe out,  and the right femoral arteriotomy was then closed with a StarClose  device.   FINDINGS:  Aortic pressure 125/78 with a mean of 99. Left ventricular  pressure 125/5 with an end-diastolic pressure of 10.   The left mainstem is angiographically normal.  It bifurcates into the  LAD and left circumflex.   The LAD is a large-caliber vessel that courses down to the LV apex.  It  gives off a first diagonal branch and is a large vessel.  There is no  significant disease in the LAD or diagonal system.   Left circumflex is a large-caliber vessel.  There is a small  intermediate branch present.  There are really no substantial obtuse  marginal branches from the left circumflex present.  The left circumflex  courses down the AV groove and gives off a large posterolateral branch.  The mid portion  of the left circumflex has a long segment of 30-40%  stenosis.   The right coronary artery is a large-caliber vessel.  It terminates  distally in a PDA and posterior AV segment which gives off two  posterolateral branches.  The mid portion of the right coronary artery  provides a small RV marginal branch, and the proximal portion has a  small conus branch present.  There are minor luminal irregularities in  the proximal portion of the right coronary artery, but there is no  significant angiographic disease.   Left ventriculography performed in the 30-degree RAO projection  demonstrates normal left ventricular systolic function with an LVEF of  55%.  There is no mitral regurgitation.   ASSESSMENT:  1. Nonobstructive coronary artery disease, predominantly involving the      left circumflex system.  2. Normal left ventricular  function.  3. Intracoronary vascular ultrasound demonstration of minimal plaque      in the right coronary artery.   PLAN:  [name] will require aggressive medical therapy.  She needs  tobacco cessation and progressive statin therapy.  She will receive  her statin therapy through the SATURN protocol.  She should be on a  daily aspirin as well.  She will be a candidate for early discharge as  her chest pain appears to have been noncardiac, and she has had no  further pain.      Veverly Fells. Excell Seltzer, MD  Electronically Signed     MDC/MEDQ  D:  06/23/2006  T:  06/23/2006  Job:  [id number]

## 2010-07-19 NOTE — Procedures (Signed)
[facility]  Patient:    [name], [name]                      MRN: [id number] Proc. Date: 10/29/99 Adm. Date:  [id number] Attending:  Nelda Marseille CC:         Colon Flattery, D.O.   Procedure Report  PROCEDURE:  Colonoscopy with biopsy.  INDICATIONS FOR PROCEDURE:  Family history of colon cancer, chronic constipation.  Consent was signed after risks, benefits, methods, and options were thoroughly discussed in the office.  MEDICINES USED:  Demerol 75, Versed 7.  DESCRIPTION OF PROCEDURE:  Rectal inspection was pertinent for external hemorrhoids, small. Digital exam was negative. The video colonoscope was inserted and very difficultly due to a tortuous sigmoid advanced around the sigmoid. Once past the sigmoid, we were able to get the loop out and then we were easily able to advance around the colon. To advance through the sigmoid required rolling her on her back and attempts at abdominal pressure. Once past the sigmoid and the loop was withdrawn, we were easily able to advance to the cecum which was identified by the appendiceal orifice and the ileocecal valve. In fact, the scope was inserted a short ways into the terminal ileum. Three small ulcers were seen in the distal ileum for cold biopsy. The rest of the terminal ileum looked fine. There was very minimal overall information. The scope was further withdrawn through the colon. One tiny sigmoid erosion was seen which was cold biopsied as well but otherwise no polypoid lesions, masses or other abnormalities were seen as we slowly withdrew around the colon. The prep was adequate. There was some stool that required washing and suctioning and she did have a tortuous sigmoid. As we filled back around the loop, we did try to readvance the curve to decrease the chance of missing things. Once back in the rectum, the scope was retroflexed pertinent for some internal hemorrhoids. The scope was  straightened, air was withdrawn and the scope removed. The patient tolerated the procedure adequately. There was no obvious or immediate complication.  ENDOSCOPIC DIAGNOSIS: 1. Internal hemorrhoids. 2. Tortuous sigmoid. 3. Tiny sigmoid erosions status post biopsy. 4. GI with few ulcers status post biopsy but no surrounding inflammation. 5. Otherwise within normal limits to the terminal ileum.  PLAN:  Await pathology probably not significant. Rescreen in 5 years. Consideration of using the pediatric colonoscope. GI follow-up p.r.n. in 1-2 months. Recheck symptoms and make sure no further workup plans are needed otherwise return care to Dr. Dewaine Conger for the customary yearly rectals and guaiacs. DD:  10/29/99 TD:  10/29/99 Job: [id number]

## 2010-07-19 NOTE — Discharge Summary (Signed)
[name], [name] NO.:  [id number]   MEDICAL RECORD NO.:  [id number]          PATIENT TYPE:  INP   LOCATION:  2007                         FACILITY:  [facility]   PHYSICIAN:  Christell Faith, MD   DATE OF BIRTH:  02-23-1956   DATE OF ADMISSION:  06/22/2006  DATE OF DISCHARGE:  06/23/2006                               DISCHARGE SUMMARY   PRIMARY CARDIOLOGIST:  Dr. Lewayne Bunting   PRIMARY CARE PHYSICIAN:  Paulita Cradle, FNP at [facility]   PROCEDURES PERFORMED DURING HOSPITALIZATION:  1. Cardiac catheterization on June 23, 2006 per Dr. Tonny Bollman.        A:  Normal systolic function with LVEF 55% without MR.        B:  Nonobstructive CAD with nonobstructive disease in the left  circumflex predominantly.        C:  Normal LV function.        D:  Medical treatment:  Saturn IVUS of the right coronary artery  performed.   DISCHARGE DIAGNOSES:  1. Chest pain.  2. Situational anxiety and depression.  3. History of hypertension.  4. History of hypercholesterolemia.  5. History of arthritis.  6. Ongoing tobacco use.  7. Perimenopausal.   HISTORY OF PRESENT ILLNESS:  [name] is a 55 year old Caucasian  female who was admitted to [facility] ER from [facility] after  arriving there with chest discomfort.  Apparently, the  patient has a son who has a previous history of crack and cocaine abuse  and had not returned home, she became very upset secondary to this and  went searching for him and found him at a friend's home and he had been  drinking.  She got into an argument with the son and became very upset  emotionally and had sudden onset of substernal chest discomfort with  associated dizziness, nausea and diaphoresis.  She states that the  discomfort was a 7/10 on intensity radiating down her left arm.  The  patient drove to [facility] and at that time was  given nitroglycerin along  with baby aspirin, EMS was notified and the  patient's blood pressure was checked at 198/32.  The patient was  transported to [facility] emergency room for further evaluation.  On  arrival to [facility], the patient was seen and examined by Dorian Pod, ANP and Dr. Lalla Brothers, cardiology fellow, with Dr. Dietrich Pates.  The  patient was placed on heparin drip, placed on a nitroglycerin drip and  admitted with cardiac enzymes cycled.  The patient was started on low-  dose beta-blocker and was followed by Dr. Excell Seltzer the following day.   She was seen and examined by Dr. Excell Seltzer on June 23, 2006 and cardiac  catheterization was completed as discussed above.  Please see Dr.  Earmon Phoenix thorough dictation note for further information.   Post cardiac catheterization, the patient did well without evidence of  bleed, hematoma or infection at the cath site.  The patient recovered  well without any further recurrence of chest discomfort.  During  evaluation, the patient was also seen by the research team and enrolled  in the [facility] Drug Study.  The patient signed the appropriate forms and  will be followed by research on discharge.  That same afternoon, the  patient was seen by Dr. Excell Seltzer and found to be stable for discharge with  followup appointment with Dr. Andee Lineman.  The patient was advised to quit  smoking and did have smoking cessation instructions during  hospitalization.   DISCHARGE LABS:  Hemoglobin 12.8, hematocrit 37.5, white blood cells  10.6, platelets 252.  PT 13.8, INR 1.0, PTT 99.  Sodium 145, potassium  3.8, chloride 155, CO2 27, glucose 102, BUN 7, creatinine 0.63.  Troponin 0.01, 0.01 and 0.01, respectively.  BNP less than 30.  Cholesterol 107, triglycerides 113, HDL 24, LDL 60.  TSH 1.288.   EKG revealing normal sinus rhythm.   DISCHARGE MEDICATIONS:  1. Saturn Study medication.  2. Aspirin 81 mg daily.  3. Protonix 40 mg daily.  4. Lexapro 5 mg at bedtime.  5. Lopressor 12.5  mg b.i.d.  6. Xanax 0.25 mg three times a day as needed (patient given two days'      dose to be followed by family practice for additional refills).  7. Nitroglycerin 0.4 mg p.r.n. chest discomfort.   ALLERGIES:  No known drug allergies.   FOLLOWUP PLANS AND APPOINTMENTS:  1. The patient is to call [facility] for      followup appointment concerning continued medical management.  She      should see them early this week.  2. The patient will follow up with the [facility] cardiology office with Dr.      Andee Lineman or physician assistant.  Followup appointment is made for      May 19 at 8:30 a.m.  3. The patient was given post cardiac catheterization instructions      with emphasis on the right groin site for evidence of bleeding,      hematoma or infection,  4. The patient has been given information concerning the [facility]      with research to follow concerning dosages and medication until      being contacted by research.  She will continue her Lipitor at      previous dose for the next couple of days until [facility] Drug Study      is instituted.  Patient verbalizes understanding.   Time spent with the patient to include physician time is 30 minutes.      Bettey Mare. Lyman Bishop, NP      Christell Faith, MD  Electronically Signed    KML/MEDQ  D:  06/23/2006  T:  06/23/2006  Job:  [id number]   cc:   Paulita Cradle, FNP

## 2011-05-05 ENCOUNTER — Emergency Department (HOSPITAL_COMMUNITY): Payer: Self-pay

## 2011-05-05 ENCOUNTER — Other Ambulatory Visit: Payer: Self-pay

## 2011-05-05 ENCOUNTER — Inpatient Hospital Stay (HOSPITAL_COMMUNITY)
Admission: EM | Admit: 2011-05-05 | Discharge: 2011-05-07 | DRG: 313 | Disposition: A | Discharge status: Home / Self Care | Payer: Self-pay | Attending: Internal Medicine | Admitting: Internal Medicine

## 2011-05-05 ENCOUNTER — Encounter (HOSPITAL_COMMUNITY): Payer: Self-pay | Admitting: *Deleted

## 2011-05-05 DIAGNOSIS — R0789 Other chest pain: Principal | ICD-10-CM | POA: Diagnosis present

## 2011-05-05 DIAGNOSIS — Z720 Tobacco use: Secondary | ICD-10-CM

## 2011-05-05 DIAGNOSIS — I10 Essential (primary) hypertension: Secondary | ICD-10-CM | POA: Diagnosis present

## 2011-05-05 DIAGNOSIS — Z79899 Other long term (current) drug therapy: Secondary | ICD-10-CM

## 2011-05-05 DIAGNOSIS — J4489 Other specified chronic obstructive pulmonary disease: Secondary | ICD-10-CM | POA: Diagnosis present

## 2011-05-05 DIAGNOSIS — Z7982 Long term (current) use of aspirin: Secondary | ICD-10-CM

## 2011-05-05 DIAGNOSIS — J449 Chronic obstructive pulmonary disease, unspecified: Secondary | ICD-10-CM

## 2011-05-05 DIAGNOSIS — E785 Hyperlipidemia, unspecified: Secondary | ICD-10-CM | POA: Diagnosis present

## 2011-05-05 DIAGNOSIS — R51 Headache: Secondary | ICD-10-CM | POA: Diagnosis not present

## 2011-05-05 DIAGNOSIS — F17200 Nicotine dependence, unspecified, uncomplicated: Secondary | ICD-10-CM | POA: Diagnosis present

## 2011-05-05 DIAGNOSIS — R072 Precordial pain: Secondary | ICD-10-CM

## 2011-05-05 HISTORY — DX: Chronic obstructive pulmonary disease, unspecified: J44.9

## 2011-05-05 HISTORY — DX: Essential (primary) hypertension: I10

## 2011-05-05 LAB — COMPREHENSIVE METABOLIC PANEL
ALT: 12 U/L (ref 0–35)
AST: 13 U/L (ref 0–37)
Alkaline Phosphatase: 79 U/L (ref 39–117)
CO2: 27 mEq/L (ref 19–32)
GFR calc Af Amer: >90 mL/min (ref >90)
GFR calc non Af Amer: >90 mL/min (ref >90)
Glucose, Bld: 99 mg/dL (ref 70–99)
Potassium: 3.7 mEq/L (ref 3.5–5.1)
Sodium: 139 mEq/L (ref 135–145)

## 2011-05-05 LAB — DIFFERENTIAL
Basophils Absolute: 0 10*3/uL (ref 0.0–0.1)
Lymphocytes Relative: 30 % (ref 12–46)
Lymphs Abs: 2.8 10*3/uL (ref 0.7–4.0)
Neutro Abs: 6 10*3/uL (ref 1.7–7.7)
Neutrophils Relative %: 63 % (ref 43–77)

## 2011-05-05 LAB — CBC
Platelets: 332 10*3/uL (ref 150–400)
RBC: 4.54 MIL/uL (ref 3.87–5.11)
RDW: 13.2 % (ref 11.5–15.5)
WBC: 9.5 10*3/uL (ref 4.0–10.5)

## 2011-05-05 LAB — CARDIAC PANEL(CRET KIN+CKTOT+MB+TROPI)
CK, MB: 1.7 ng/mL (ref 0.3–4.0)
Relative Index: INVALID (ref 0.0–2.5)
Relative Index: INVALID (ref 0.0–2.5)
Total CK: 75 U/L (ref 7–177)
Troponin I: <0.3 ng/mL (ref <0.30)

## 2011-05-05 LAB — POCT I-STAT TROPONIN I: Troponin i, poc: 0 ng/mL (ref 0.00–0.08)

## 2011-05-05 LAB — URINALYSIS, ROUTINE W REFLEX MICROSCOPIC
Bilirubin Urine: NEGATIVE
Glucose, UA: NEGATIVE mg/dL
Ketones, ur: NEGATIVE mg/dL
Specific Gravity, Urine: 1.02 (ref 1.005–1.030)
pH: 6 (ref 5.0–8.0)

## 2011-05-05 MED ORDER — ONDANSETRON HCL 4 MG/2ML IJ SOLN: 4.0000 mg | Freq: Four times a day (QID) | INTRAMUSCULAR | Status: DC | PRN

## 2011-05-05 MED ORDER — NITROGLYCERIN 0.4 MG SL SUBL
0.4000 mg | SUBLINGUAL_TABLET | SUBLINGUAL | Status: DC | PRN
  Administered 2011-05-05: 0.4 mg via SUBLINGUAL

## 2011-05-05 MED ORDER — ONDANSETRON HCL 4 MG PO TABS: 4.0000 mg | ORAL_TABLET | Freq: Four times a day (QID) | ORAL | Status: DC | PRN

## 2011-05-05 MED ORDER — DOCUSATE SODIUM 100 MG PO CAPS
100.0000 mg | ORAL_CAPSULE | Freq: Two times a day (BID) | ORAL | Status: DC
  Administered 2011-05-05 – 2011-05-07 (×4): 100 mg via ORAL
  Filled 2011-05-05 (×4): qty 1

## 2011-05-05 MED ORDER — ENOXAPARIN SODIUM 40 MG/0.4ML ~~LOC~~ SOLN
40.0000 mg | SUBCUTANEOUS | Status: DC
  Administered 2011-05-06 (×2): 40 mg via SUBCUTANEOUS
  Filled 2011-05-05 (×2): qty 0.4

## 2011-05-05 MED ORDER — NITROGLYCERIN 0.4 MG SL SUBL
0.4000 mg | SUBLINGUAL_TABLET | Freq: Once | SUBLINGUAL | Status: AC
  Administered 2011-05-05: 0.4 mg via SUBLINGUAL

## 2011-05-05 MED ORDER — BUDESONIDE-FORMOTEROL FUMARATE 160-4.5 MCG/ACT IN AERO
2.0000 | INHALATION_SPRAY | Freq: Two times a day (BID) | RESPIRATORY_TRACT | Status: DC
  Administered 2011-05-06 (×2): 2 via RESPIRATORY_TRACT
  Filled 2011-05-05: qty 6

## 2011-05-05 MED ORDER — IPRATROPIUM BROMIDE 0.02 % IN SOLN
0.5000 mg | Freq: Four times a day (QID) | RESPIRATORY_TRACT | Status: DC
  Administered 2011-05-05 – 2011-05-06 (×4): 0.5 mg via RESPIRATORY_TRACT
  Filled 2011-05-05 (×4): qty 2.5

## 2011-05-05 MED ORDER — NICOTINE 14 MG/24HR TD PT24
14.0000 mg | MEDICATED_PATCH | Freq: Every day | TRANSDERMAL | Status: DC
  Administered 2011-05-06 – 2011-05-07 (×3): 14 mg via TRANSDERMAL
  Filled 2011-05-05 (×3): qty 1

## 2011-05-05 MED ORDER — ASPIRIN 81 MG PO CHEW
324.0000 mg | CHEWABLE_TABLET | Freq: Once | ORAL | Status: AC
  Administered 2011-05-05: 324 mg via ORAL

## 2011-05-05 MED ORDER — ASPIRIN 81 MG PO CHEW
CHEWABLE_TABLET | ORAL | Status: AC
  Administered 2011-05-05: 324 mg via ORAL
  Filled 2011-05-05: qty 4

## 2011-05-05 MED ORDER — SENNA 8.6 MG PO TABS
1.0000 | ORAL_TABLET | Freq: Two times a day (BID) | ORAL | Status: DC
  Administered 2011-05-05 – 2011-05-07 (×4): 8.6 mg via ORAL
  Filled 2011-05-05 (×4): qty 1

## 2011-05-05 MED ORDER — ALBUTEROL SULFATE (5 MG/ML) 0.5% IN NEBU
2.5000 mg | INHALATION_SOLUTION | Freq: Four times a day (QID) | RESPIRATORY_TRACT | Status: DC
  Administered 2011-05-05 – 2011-05-06 (×4): 2.5 mg via RESPIRATORY_TRACT
  Filled 2011-05-05 (×4): qty 0.5

## 2011-05-05 MED ORDER — BUDESONIDE-FORMOTEROL FUMARATE 160-4.5 MCG/ACT IN AERO
INHALATION_SPRAY | RESPIRATORY_TRACT | Status: AC
  Filled 2011-05-05: qty 6

## 2011-05-05 MED ORDER — MORPHINE SULFATE 2 MG/ML IJ SOLN
2.0000 mg | INTRAMUSCULAR | Status: DC | PRN
  Administered 2011-05-05: 2 mg via INTRAVENOUS
  Filled 2011-05-05: qty 1

## 2011-05-05 MED ORDER — ALBUTEROL SULFATE (5 MG/ML) 0.5% IN NEBU: 2.5000 mg | INHALATION_SOLUTION | RESPIRATORY_TRACT | Status: DC | PRN

## 2011-05-05 MED ORDER — ACETAMINOPHEN 325 MG PO TABS
650.0000 mg | ORAL_TABLET | Freq: Four times a day (QID) | ORAL | Status: DC | PRN
  Administered 2011-05-06: 650 mg via ORAL
  Filled 2011-05-05: qty 2

## 2011-05-05 MED ORDER — SODIUM CHLORIDE 0.9 % IJ SOLN
3.0000 mL | Freq: Two times a day (BID) | INTRAMUSCULAR | Status: DC
  Administered 2011-05-06: 3 mL via INTRAVENOUS
  Filled 2011-05-05 (×2): qty 3

## 2011-05-05 MED ORDER — METOPROLOL TARTRATE 25 MG PO TABS
12.5000 mg | ORAL_TABLET | Freq: Two times a day (BID) | ORAL | Status: DC
  Administered 2011-05-05 – 2011-05-06 (×2): 12.5 mg via ORAL
  Filled 2011-05-05 (×2): qty 1

## 2011-05-05 MED ORDER — ZOLPIDEM TARTRATE 5 MG PO TABS
5.0000 mg | ORAL_TABLET | Freq: Every evening | ORAL | Status: DC | PRN
  Administered 2011-05-06: 5 mg via ORAL
  Filled 2011-05-05: qty 1

## 2011-05-05 MED ORDER — GUAIFENESIN ER 600 MG PO TB12
600.0000 mg | ORAL_TABLET | Freq: Two times a day (BID) | ORAL | Status: DC
  Administered 2011-05-05 – 2011-05-07 (×4): 600 mg via ORAL
  Filled 2011-05-05 (×4): qty 1

## 2011-05-05 MED ORDER — ACETAMINOPHEN 650 MG RE SUPP: 650.0000 mg | Freq: Four times a day (QID) | RECTAL | Status: DC | PRN

## 2011-05-05 MED ORDER — ASPIRIN EC 325 MG PO TBEC
325.0000 mg | DELAYED_RELEASE_TABLET | Freq: Every day | ORAL | Status: DC
  Administered 2011-05-06 – 2011-05-07 (×2): 325 mg via ORAL
  Filled 2011-05-05 (×2): qty 1

## 2011-05-05 MED ORDER — SODIUM CHLORIDE 0.9 % IV SOLN
INTRAVENOUS | Status: DC
  Administered 2011-05-06 – 2011-05-07 (×2): via INTRAVENOUS

## 2011-05-05 MED ORDER — IBUPROFEN 600 MG PO TABS
600.0000 mg | ORAL_TABLET | Freq: Once | ORAL | Status: AC
  Administered 2011-05-05: 600 mg via ORAL
  Filled 2011-05-05: qty 1

## 2011-05-05 MED ORDER — LISINOPRIL 10 MG PO TABS
20.0000 mg | ORAL_TABLET | Freq: Every day | ORAL | Status: DC
  Administered 2011-05-06 – 2011-05-07 (×2): 20 mg via ORAL
  Filled 2011-05-05 (×2): qty 2

## 2011-05-05 MED ORDER — SIMVASTATIN 20 MG PO TABS
20.0000 mg | ORAL_TABLET | Freq: Every day | ORAL | Status: DC
  Administered 2011-05-05 – 2011-05-06 (×2): 20 mg via ORAL
  Filled 2011-05-05 (×2): qty 1

## 2011-05-05 MED ORDER — PANTOPRAZOLE SODIUM 40 MG PO TBEC
40.0000 mg | DELAYED_RELEASE_TABLET | Freq: Every day | ORAL | Status: DC
  Administered 2011-05-05 – 2011-05-07 (×3): 40 mg via ORAL
  Filled 2011-05-05 (×3): qty 1

## 2011-05-05 MED ORDER — HYDROCODONE-ACETAMINOPHEN 10-325 MG PO TABS
1.0000 | ORAL_TABLET | Freq: Four times a day (QID) | ORAL | Status: DC
  Administered 2011-05-05 – 2011-05-07 (×7): 1 via ORAL
  Filled 2011-05-05 (×7): qty 1

## 2011-05-05 MED ORDER — ALUM & MAG HYDROXIDE-SIMETH 200-200-20 MG/5ML PO SUSP: 30.0000 mL | Freq: Four times a day (QID) | ORAL | Status: DC | PRN

## 2011-05-05 MED ORDER — NITROGLYCERIN 0.4 MG SL SUBL
SUBLINGUAL_TABLET | SUBLINGUAL | Status: AC
  Administered 2011-05-05: 0.4 mg via SUBLINGUAL
  Filled 2011-05-05: qty 25

## 2011-05-05 MED ORDER — MOXIFLOXACIN HCL IN NACL 400 MG/250ML IV SOLN
INTRAVENOUS | Status: AC
  Filled 2011-05-05: qty 250

## 2011-05-05 MED ORDER — MOXIFLOXACIN HCL IN NACL 400 MG/250ML IV SOLN
400.0000 mg | INTRAVENOUS | Status: DC
  Administered 2011-05-06 (×2): 400 mg via INTRAVENOUS
  Filled 2011-05-05 (×3): qty 250

## 2011-05-05 NOTE — ED Notes (Signed)
Chest pain for 3-4 hours

## 2011-05-05 NOTE — ED Provider Notes (Signed)
History   This chart was scribed for Geoffery Lyons, MD by Sofie Rower. The patient was seen in room APA11/APA11 and the patient's care was started at 3:30PM.    CSN: [id number]  Arrival date & time 05/05/11  1348   First MD Initiated Contact with Patient 05/05/11 1509      Chief Complaint  Patient presents with  . Chest Pain    (Consider location/radiation/quality/duration/timing/severity/associated sxs/prior treatment) HPI  [name] is a 56 y.o. female who presents to the Emergency Department complaining of moderate, constant chest pain located in her left shoulder onset yesterday with associated symptoms of shortness of breath, sweats. Pt states pain began last night while she was doing laundry and got worse this morning. Pt has had symptoms like these before (2 years ago), "where she went to [facility] and her blood pressure went really high". Pt had a stress test 6 years ago. Pt states she "sometimes gets chest pains when she goes out walking". Pt has a hx of degenerative disk disease, COPD, emphysema, family hx of MI (brother 57 years, mother 40 years, father 63 years of age when MI occured).Pt is a smoker (1 pack a day), pt takes blood pressure medication.   Pt denies cough, nausea, oxygen at home  Pt cardiologist is Dr. Andee Lineman.    Past Medical History  Diagnosis Date  . COPD (chronic obstructive pulmonary disease)   . Hypertension     Past Surgical History  Procedure Date  . Abdominal hysterectomy     No family history on file.  History  Substance Use Topics  . Smoking status: Current Everyday Smoker  . Smokeless tobacco: Not on file  . Alcohol Use: No    OB History    Grav Para Term Preterm Abortions TAB SAB Ect Mult Living                  Review of Systems  All other systems reviewed and are negative.   10 Systems reviewed and are negative for acute change except as noted in the HPI.   Allergies  Review of patient's allergies indicates no known  allergies.  Home Medications  No current outpatient prescriptions on file.  BP 108/69  Pulse 79  Temp(Src) 98.3 F (36.8 C) (Oral)  Resp 20  SpO2 99%  Physical Exam  Nursing note and vitals reviewed. Constitutional: She is oriented to person, place, and time. She appears well-developed and well-nourished. No distress.  HENT:  Head: Normocephalic and atraumatic.  Right Ear: External ear normal.  Left Ear: External ear normal.  Eyes: Conjunctivae and EOM are normal. Right eye exhibits no discharge. Left eye exhibits no discharge. No scleral icterus.  Neck: Normal range of motion. Neck supple. No tracheal deviation present. No thyromegaly present.  Cardiovascular: Normal rate, regular rhythm, normal heart sounds and intact distal pulses.  Exam reveals no gallop and no friction rub.   No murmur heard. Pulmonary/Chest: Effort normal and breath sounds normal. No stridor. No respiratory distress. She has no wheezes. She has no rales. She exhibits no tenderness.  Abdominal: Soft. Bowel sounds are normal. She exhibits no distension. There is no tenderness. There is no rebound and no guarding.  Musculoskeletal: Normal range of motion. She exhibits no edema and no tenderness.  Lymphadenopathy:    She has no cervical adenopathy.  Neurological: She is alert and oriented to person, place, and time. She has normal strength. No cranial nerve deficit or sensory deficit. She exhibits normal muscle tone.  She displays no seizure activity. Coordination normal.  Skin: Skin is warm and dry. No rash noted. No erythema.  Psychiatric: She has a normal mood and affect. Her behavior is normal.    ED Course  Procedures (including critical care time)  DIAGNOSTIC STUDIES: Oxygen Saturation is 99% on [place], normal by my interpretation.    COORDINATION OF CARE:      Labs Reviewed  POCT I-STAT TROPONIN I   No results found.   No diagnosis found.   3:34PM- EDP at bedside discusses treatment plan.    Date: 05/05/2011  Rate: 80  Rhythm: normal sinus rhythm  QRS Axis: normal  Intervals: normal  ST/T Wave abnormalities: normal  Conduction Disutrbances:none  Narrative Interpretation:   Old EKG Reviewed: unchanged    MDM  The symptoms are concerning and she has multiple risk factors but negative workup thus far.  Will consult medicine for admission.      I personally performed the services described in this documentation, which was scribed in my presence. The recorded information has been reviewed and considered.     Geoffery Lyons, MD 05/05/11 [phone]

## 2011-05-05 NOTE — ED Notes (Signed)
Report received from sandra, rn. 

## 2011-05-05 NOTE — H&P (Signed)
PCP:   No primary provider on file.   Chief Complaint:  Chest pain for 1 day.  HPI: [name] reports chest pain which started this morning, central, pressure like 8/10 in intensity, radiates to the left shoulder. She has cough, which she says is chronic. No fever. Pain, worse when he changes position in bed. Constant. Says she hasn't received any pain meds in ED. Denies nausea or diaphoresis. Cxr/cardiac enzymes/ekg unremarkable so far. Had stress test/cardiac cath 6 years, insignificant vessel disease being treated medically.  Review of Systems:  Negative except as highlighted in hpi.  Past Medical History: Past Medical History  Diagnosis Date  . COPD (chronic obstructive pulmonary disease)   . Hypertension    Past Surgical History  Procedure Date  . Abdominal hysterectomy     Medications: Prior to Admission medications   Medication Sig Start Date End Date Taking? Authorizing Provider  aspirin EC 81 MG tablet Take 81 mg by mouth daily.   Yes Historical Provider, MD  budesonide-formoterol (SYMBICORT) 160-4.5 MCG/ACT inhaler Inhale 2 puffs into the lungs 2 (two) times daily.   Yes Historical Provider, MD  fish oil-omega-3 fatty acids 1000 MG capsule Take 1 g by mouth 2 (two) times daily.   Yes Historical Provider, MD  HYDROcodone-acetaminophen (NORCO) 10-325 MG per tablet Take 1 tablet by mouth 4 (four) times daily.   Yes Historical Provider, MD  lisinopril (PRINIVIL,ZESTRIL) 20 MG tablet Take 20 mg by mouth daily.   Yes Historical Provider, MD  pravastatin (PRAVACHOL) 40 MG tablet Take 40 mg by mouth at bedtime.   Yes Historical Provider, MD    Allergies:  No Known Allergies  Social History:  reports that she has been smoking.  She does not have any smokeless tobacco history on file. She reports that she does not drink alcohol or use illicit drugs.   Family History: Unremarkable.  Physical Exam: Filed Vitals:   05/05/11 1503 05/05/11 1627 05/05/11 1738 05/05/11 1840    BP: 108/69 109/45 128/84 130/78  Pulse: 79 69 85 76  Temp:      TempSrc:      Resp:  20 20 20   SpO2: 99% 100% 100% 100%   Not in distress. No JVD. Lungs clear. CVS-S1S2. RRR. No murmurs. Abdomen- benign. CN grossly intact. Extremities- no pedal edema.    Labs on Admission:   [facility] 05/05/11 1559  NA 139  K 3.7  CL 102  CO2 27  GLUCOSE 99  BUN 15  CREATININE 0.66  CALCIUM 10.0  MG --  PHOS --    Basename 05/05/11 1559  AST 13  ALT 12  ALKPHOS 79  BILITOT 0.1*  PROT 7.1  ALBUMIN 3.8   No results found for this basename: LIPASE:2,AMYLASE:2 in the last 72 hours  Basename 05/05/11 1559  WBC 9.5  NEUTROABS 6.0  HGB 13.7  HCT 41.7  MCV 91.9  PLT 332    Basename 05/05/11 1559  CKTOTAL 89  CKMB 1.8  CKMBINDEX --  TROPONINI <0.30   No results found for this basename: TSH,T4TOTAL,FREET3,T3FREE,THYROIDAB in the last 72 hours No results found for this basename: VITAMINB12:2,FOLATE:2,FERRITIN:2,TIBC:2,IRON:2,RETICCTPCT:2 in the last 72 hours  Radiological Exams on Admission: Dg Chest 2 View  05/05/2011  *RADIOLOGY REPORT*  Clinical Data: Chest pain  CHEST - 2 VIEW  Comparison: 06/22/2006  Findings: Lungs are mildly hyperexpanded. The lungs are clear without focal infiltrate, edema, pneumothorax or pleural effusion. The cardiopericardial silhouette is within normal limits for size. Telemetry leads overlie the  chest.  IMPRESSION: Mild hyperexpansion without acute cardiopulmonary findings.  Original Report Authenticated By: ERIC A. MANSELL, M.D.    Assessment 56 year old hypertensive, active smoker presenting with recurrent chest pain concerning for acs. Maybe be musculoskeletal as reproducible pain on chest palpation. There may be COPD exacerbation element. Plan .Chest pain, central- r/o mi. Serial cardiac enzymes/2decho. Treat COPD. ASA/beta blocker/acei. Cardiology consult- ?further cardiac work up. Marland KitchenHTN (hypertension), malignant- Not optimally controlled. add low  dose lopressor. .Tobacco abuse disorder- smoking cessation counseling given. Nicotine patch. .Hyperlipidemia- statin.  Dvt/gi prophylaxis. Condition guarded.    [name] [phone]. 05/05/2011, 8:16 PM

## 2011-05-06 ENCOUNTER — Other Ambulatory Visit: Payer: Self-pay

## 2011-05-06 ENCOUNTER — Encounter (HOSPITAL_COMMUNITY): Payer: Self-pay | Admitting: Adult Health

## 2011-05-06 DIAGNOSIS — I10 Essential (primary) hypertension: Secondary | ICD-10-CM

## 2011-05-06 DIAGNOSIS — I517 Cardiomegaly: Secondary | ICD-10-CM

## 2011-05-06 DIAGNOSIS — J449 Chronic obstructive pulmonary disease, unspecified: Secondary | ICD-10-CM

## 2011-05-06 DIAGNOSIS — E785 Hyperlipidemia, unspecified: Secondary | ICD-10-CM

## 2011-05-06 DIAGNOSIS — R072 Precordial pain: Secondary | ICD-10-CM

## 2011-05-06 DIAGNOSIS — F17200 Nicotine dependence, unspecified, uncomplicated: Secondary | ICD-10-CM

## 2011-05-06 LAB — CARDIAC PANEL(CRET KIN+CKTOT+MB+TROPI)
CK, MB: 1.6 ng/mL (ref 0.3–4.0)
Relative Index: INVALID (ref 0.0–2.5)
Total CK: 57 U/L (ref 7–177)
Troponin I: <0.3 ng/mL (ref <0.30)

## 2011-05-06 LAB — LIPID PANEL
Cholesterol: 165 mg/dL (ref 0–200)
HDL: 35 mg/dL — ABNORMAL LOW (ref >39)
Triglycerides: 195 mg/dL — ABNORMAL HIGH (ref <150)

## 2011-05-06 MED ORDER — IPRATROPIUM BROMIDE 0.02 % IN SOLN: 0.5000 mg | Freq: Four times a day (QID) | RESPIRATORY_TRACT | Status: DC

## 2011-05-06 MED ORDER — ALBUTEROL SULFATE (5 MG/ML) 0.5% IN NEBU: 2.5000 mg | INHALATION_SOLUTION | Freq: Four times a day (QID) | RESPIRATORY_TRACT | Status: DC

## 2011-05-06 NOTE — Progress Notes (Signed)
CARE MANAGEMENT NOTE 05/06/2011  Patient:  [name], [name]   Account Number:  [id number]  Date Initiated:  05/06/2011  Documentation initiated by:  Rosemary Holms  Subjective/Objective Assessment:   Pt admitted with pain in her chest. States she had pain before but this was different. PTA lived at home with spouse. Independent.     Action/Plan:   Plan to DC home. At this time no HH needs identified.   Anticipated DC Date:  05/07/2011   Anticipated DC Plan:  HOME/SELF CARE      DC Planning Services  CM consult      Choice offered to / List presented to:             Status of service:  In process, will continue to follow Medicare Important Message given?   (If response is "NO", the following Medicare IM given date fields will be blank) Date Medicare IM given:   Date Additional Medicare IM given:    Discharge Disposition:    Per UR Regulation:    Comments:  05/06/11 905 Brit Wernette RN BSN CM

## 2011-05-06 NOTE — Progress Notes (Signed)
Was called by nurse around 1525 to evaluate patient due to headache.  Patient's vitals were reported to be within normal limits.  Patient denies any new focal neurological weakness associated with the headaches.  She mentions that she takes a large amount of caffeine at home and since here has only had a coca cola can.  Was recently given tylenol for her headache.    Gen: in NAD Neuro:  No focal neurological findings.  PERRLA, EOMI, answers questions appropriately.  Assessment: Headache:  Likely due to caffeine withdrawal.  At this point patient has been given Tylenol.  Was also complaining of floaters but patient has had this problem and denies any new visual field defects or unilateral visual field defects "like a curtain covering part of her vision" or a shower of floaters.

## 2011-05-06 NOTE — Progress Notes (Signed)
*  PRELIMINARY RESULTS* Echocardiogram 2D Echocardiogram has been performed.  Conrad El [place]ango 05/06/2011, 2:08 PM

## 2011-05-06 NOTE — Progress Notes (Signed)
Subjective: Pt denies any active chest discomfort.  When she presses on her chest it does cause some discomfort.  Mentions that her pain is improved.  Denies any diaphoresis, nausea, or emesis.  Objective: Filed Vitals:   05/05/11 2030 05/05/11 2117 05/06/11 0532 05/06/11 0740  BP: 109/50 121/75 108/67   Pulse: 103 69 63   Temp:  98 F (36.7 C) 97.5 F (36.4 C)   TempSrc:  Oral Oral   Resp: 17 18 18    Height:  5\' 5"  (1.651 m)    Weight:  72.4 kg (159 lb 9.8 oz)    SpO2: 99% 91% 96% 98%   Weight change:   Intake/Output Summary (Last 24 hours) at 05/06/11 1303 Last data filed at 05/06/11 0534  Gross per 24 hour  Intake    720 ml  Output    300 ml  Net    420 ml    General: Alert, awake, oriented x3, in no acute distress.  HEENT: No bruits, no goiter.  Heart: Regular rate and rhythm, without murmurs, rubs, gallops.  Lungs: prolonged expiratory phase, no wheezes or rhales. Abdomen: Soft, nontender, nondistended, positive bowel sounds.  Neuro: Grossly intact, nonfocal.   Lab Results:  [facility] 05/05/11 2140 05/05/11 1559  NA -- 139  K -- 3.7  CL -- 102  CO2 -- 27  GLUCOSE -- 99  BUN -- 15  CREATININE -- 0.66  CALCIUM -- 10.0  MG 2.2 --  PHOS 4.1 --    Basename 05/05/11 1559  AST 13  ALT 12  ALKPHOS 79  BILITOT 0.1*  PROT 7.1  ALBUMIN 3.8   No results found for this basename: LIPASE:2,AMYLASE:2 in the last 72 hours  Basename 05/05/11 1559  WBC 9.5  NEUTROABS 6.0  HGB 13.7  HCT 41.7  MCV 91.9  PLT 332    Basename 05/06/11 0622 05/05/11 2140 05/05/11 1559  CKTOTAL 57 75 89  CKMB 1.6 1.7 1.8  CKMBINDEX -- -- --  TROPONINI <0.30 <0.30 <0.30   No components found with this basename: POCBNP:3 No results found for this basename: DDIMER:2 in the last 72 hours No results found for this basename: HGBA1C:2 in the last 72 hours  Basename 05/06/11 0621  CHOL 165  HDL 35*  LDLCALC 91  TRIG 161*  CHOLHDL 4.7  LDLDIRECT --   No results found for this  basename: TSH,T4TOTAL,FREET3,T3FREE,THYROIDAB in the last 72 hours No results found for this basename: VITAMINB12:2,FOLATE:2,FERRITIN:2,TIBC:2,IRON:2,RETICCTPCT:2 in the last 72 hours  Micro Results: No results found for this or any previous visit (from the past 240 hour(s)).  Studies/Results: Dg Chest 2 View  05/05/2011  *RADIOLOGY REPORT*  Clinical Data: Chest pain  CHEST - 2 VIEW  Comparison: 06/22/2006  Findings: Lungs are mildly hyperexpanded. The lungs are clear without focal infiltrate, edema, pneumothorax or pleural effusion. The cardiopericardial silhouette is within normal limits for size. Telemetry leads overlie the chest.  IMPRESSION: Mild hyperexpansion without acute cardiopulmonary findings.  Original Report Authenticated By: ERIC A. MANSELL, M.D.    Medications: I have reviewed the patient's current medications.   Patient Active Hospital Problem List: Precordial pain (05/05/2011) At this juncture chest discomfort seems atypical for cardiac origin.  Cardiology following and mentions that they will review Echo performed today and will likely order a Myoview tomorrow.  Will follow up with their evaluations and recommendations.  Essential hypertension, benign (05/05/2011) Pt is currently on lisinopril.  Last reported blood pressure 108/67.  At this point will continue to monitor blood  pressure levels.  Tobacco abuse disorder (05/05/2011) Discuss tobacco cessation with patient.  Hyperlipidemia (05/05/2011) Pt is currently on Zocor.  Stable  COPD (chronic obstructive pulmonary disease) (05/06/2011) Stable at this point will plan on continuing current regimen.     LOS: 1 day   Penny Pia M.D.  [facility] 05/06/2011, 1:03 PM

## 2011-05-06 NOTE — Consult Note (Signed)
CARDIOLOGY CONSULT NOTE  Patient ID: [name] MRN: [id number] DOB/AGE: 58-Dec-1957 58 y.o.  Admit date: 05/05/2011 Referring Physician: PTH Primary Physician:[name] Dept.   Primary Cardiologist: [name] Reason for Consultation: Chest Pain Active Problems:  Chest pain, central  HTN (hypertension), malignant  Tobacco abuse disorder  Hyperlipidemia  HPI: [name] is a 56 year old patient of Dr. he began to in the [place] office, who has been lost to followup since 2011. She has a history of atypical chest and has had prior cardiac studies to include a cardiac catheterization in 2008 revealing nonobstructive disease along with a echocardiogram in 2011, which was found to be normal. She has a history of hypertension, depression, COPD, and ongoing tobacco. She has been lost to followup secondary to lack of insurance and has not been seeing her primary care physician. She goes to the [facility]. for her medications. She denies any medical compliance, but admits that she has a limited some of her medications to save money. She states that she is compliant with her antihypertensives.    On day of admission. She out, sharp chest pain and midsternally radiating to the left shoulder and down the left arm. The sharpness would go away, but come back with body movement. She states that she felt a sore feeling there all day whether the sharpness occurred or not. She states she felt weak all over with no associated diaphoresis, dizziness, or shortness of breath. As result of these symptoms. She was seen in the emergency room, where she was found to be hypertensive . Blood pressure 140/116. Her pulse was 88. Her O2 sat was 99%. He was treated with nitroglycerin, aspirin, and morphine. She states that the pain is gone away without recurrence of sharpness, but she continues to feel soreness. Chest x-ray revealed mild hyperexpansion without acute cardiopulmonary findings. Recheck enzymes  were found to be negative x3.  Review of systems complete and found to be negative unless listed above   Past Medical History  Diagnosis Date  . COPD (chronic obstructive pulmonary disease)   . Hypertension     Family History  Problem Relation Age of Onset  . Heart disease Mother   . Heart attack Mother   . Heart attack Father   . Heart attack Brother     History   Social History  . Marital Status: Married    Spouse Name: N/A    Number of Children: N/A  . 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 Smoking status: Current Everyday Smoker  . Smokeless tobacco: Not on file  . Alcohol Use: No  . Drug Use: No  . Sexually Active:    Other Topics Concern  . Not on file   Social History Narrative   [place] husbandUnder a lot of family stressGo[facility]    Past Surgical History  Procedure Date  . Abdominal hysterectomy      Prescriptions prior to admission  Medication Sig Dispense Refill  . aspirin EC 81 MG tablet Take 81 mg by mouth daily.      . budesonide-formoterol (SYMBICORT) 160-4.5 MCG/ACT inhaler Inhale 2 puffs into the lungs 2 (two) times daily.      . fish oil-omega-3 fatty acids 1000 MG capsule Take 1 g by mouth 2 (two) times daily.      Marland Kitchen  HYDROcodone-acetaminophen (NORCO) 10-325 MG per tablet Take 1 tablet by mouth 4 (four) times daily.      Marland Kitchen lisinopril (PRINIVIL,ZESTRIL) 20 MG tablet Take 20 mg by mouth daily.      . pravastatin (PRAVACHOL) 40 MG tablet Take 40 mg by mouth at bedtime.       ECHO 10/2009  Left ventricle: The cavity size was normal. There was mild basal   hypertrophy of the septum. Systolic function was normal. The   estimated ejection fraction was in the range of 60% to 65%. Wall   motion was normal; there were no regional wall motion   abnormalities. - Aortic valve: Mildly calcified annulus. Trileaflet; normal    thickness, mildly calcified leaflets. Trivial regurgitation. - Pulmonary arteries: PA peak pressure: 42mm Hg (S).  Cardiac Cath 06/23/2006 ASSESSMENT:  1. Nonobstructive coronary artery disease, predominantly involving the  left circumflex system.  2. Normal left ventricular function.  3. Intracoronary vascular ultrasound demonstration of minimal plaque  in the right coronary artery.  Physical Exam: Blood pressure 108/67, pulse 63, temperature 97.5 F (36.4 C), temperature source Oral, resp. rate 18, height 5\' 5"  (1.651 m), weight 159 lb 9.8 oz (72.4 kg), SpO2 98.00%.    General: Well developed, well nourished, in no acute distress Head: Eyes PERRLA, wears glasses. No xanthomas.   Normal cephalic and atramatic  Lungs:Mild bibasilar crackles Heart: HRRR S1 S2, without MRG.  Pulses are 2+ & equal.            No carotid bruit. No JVD.  No abdominal bruits. No femoral bruits. Abdomen: Bowel sounds are positive, abdomen soft and non-tender without masses or                  Hernia's noted. Msk:  Back normal, normal gait. Normal strength and tone for age. Extremities: No clubbing, cyanosis or edema.  DP +1 Neuro: Alert and oriented X 3. Psych:  Flat affect, responds appropriately  Labs:   Lab Results  Component Value Date   WBC 9.5 05/05/2011   HGB 13.7 05/05/2011   HCT 41.7 05/05/2011   MCV 91.9 05/05/2011   PLT 332 05/05/2011     Lab 05/05/11 1559  NA 139  K 3.7  CL 102  CO2 27  BUN 15  CREATININE 0.66  CALCIUM 10.0  PROT 7.1  BILITOT 0.1*  ALKPHOS 79  ALT 12  AST 13  GLUCOSE 99   Lab Results  Component Value Date   CKTOTAL 57 05/06/2011   CKMB 1.6 05/06/2011   TROPONINI <0.30 05/06/2011    Lab Results  Component Value Date   CHOL 165 05/06/2011   CHOL  Value: 252        ATP III CLASSIFICATION:  <200     mg/dL   Desirable  161-096  mg/dL   Borderline High  >=045    mg/dL   High       * 06/09/8117   Lab Results  Component Value Date   HDL 35* 05/06/2011   HDL 39* 10/11/2009    Lab Results  Component Value Date   LDLCALC 91 05/06/2011   LDLCALC  Value: 182        Total Cholesterol/HDL:CHD Risk Coronary Heart Disease Risk Table                     Men   Women  1/2 Average Risk   3.4   3.3  Average Risk  5.0   4.4  2 X Average Risk   9.6   7.1  3 X Average Risk  23.4   11.0        Use the calculated Patient Ratio above and the CHD Risk Table to determine the patient's CHD Risk.        ATP III CLASSIFICATION (LDL):  <100     mg/dL   Optimal  119-147  mg/dL   Near or Above                    Optimal  130-159  mg/dL   Borderline  829-562  mg/dL   High  >130     mg/dL   Very High* 8/65/7846   Lab Results  Component Value Date   TRIG 195* 05/06/2011   TRIG 157* 10/11/2009   Lab Results  Component Value Date   CHOLHDL 4.7 05/06/2011   CHOLHDL 6.5 10/11/2009   No results found for this basename: LDLDIRECT      Radiology: Dg Chest 2 View  05/05/2011  *RADIOLOGY REPORT*  Clinical Data: Chest pain  CHEST - 2 VIEW  Comparison: 06/22/2006  Findings: Lungs are mildly hyperexpanded. The lungs are clear without focal infiltrate, edema, pneumothorax or pleural effusion. The cardiopericardial silhouette is within normal limits for size. Telemetry leads overlie the chest.  IMPRESSION: Mild hyperexpansion without acute cardiopulmonary findings.  Original Report Authenticated By: ERIC A. MANSELL, M.D.   NGE:[id number]  ASSESSMENT AND PLAN:   1. Chest pain: Typical and atypical symptoms with pain described as sharp, radiating to the shoulder and left arm along with some discomfort in the midsternal area described-soreness. This was also associated with hypertension. Review of home meds do not listed. She was on a beta blocker, therefore, bronchospasms would be ruled out for cause. Cardiac enzymes are found to be negative. There is no EKG available for review. We will repeat this. Plan stress test in a.m. to rule out myocardial ischemia, though unlikely, that this is cardiac in etiology  secondary to essentially normal cardiac catheterization in 2008.  2. Hypertension: Blood pressure was elevated on admission to the emergency room, but has been low normal since admission. Although she denies medical noncompliance, this is a concern as her blood pressure is well-controlled,while hospitalized. She states she has been under a lot of family pressure which has increased. Her anxiety level, which could be contributing. Would recommend continuing current ACE inhibitor for blood pressure control, but would eliminate metoprolol to avoid bronchospasms in a patient with COPD. Consider calcium channel blocker, instead of ACE inhibitor, if she is having coronary spasms, although this has not been documented.  3. COPD: She, unfortunately continues to smoke, and I spoke with her about this. She is trying to cut down.  Signed: Bettey Mare. Lyman Bishop NP [facility] 05/06/2011, 10:48 AM Co-Sign MD   Attending note:  Patient seen and examined. Reviewed records and database as recorded by Ms. Lawrence. She has been followed most recently by the [facility], has not seen Dr. Andee Lineman since 2011. Cardiac history includes only minor plaquing at catheterization in 2008, risk factors including tobacco use for COPD, hypertension.  She is admitted to the hospital with recent onset of intermittent "sharp" chest pain, mainly in the midsternal area, some radiation to the left arm and shoulder. This has been exacerbated by movement, also under emotional stress. She has baseline shortness of breath and chronic cough.  On examination she is comfortable this  morning. Afebrile, blood pressure 108/67, heart rate 63 in sinus rhythm. ECG not yet loaded into EPIC for review. Reportedly nonacute in ER. Lungs exhibit diminished breath sounds without wheezing, cardiac exam with regular rate and rhythm, no S3.  Cardiac markers have been normal. Potassium 3.7, BUN 15, creatinine 0.6, LDL 91,  hemoglobin 13.7, platelets 332. Chest x-ray reports mild hyperinflation with no acute findings.  Atypical chest pain syndrome with active risk factors include continued tobacco abuse, hypertension. She has no health insurance at this time, [facility] States that she takes her medications when they are available. We will followup on the echocardiogram that is already ordered, and schedule a Myoview for tomorrow. If low risk, can followup with her primary provider [facility] the [facility].  Jonelle Sidle, M.D., F.A.C.C.

## 2011-05-06 NOTE — Progress Notes (Signed)
Pt c/o headache, dizziness and seeing "spots".  All Vital signs are WDL and documented.  Patient denies chest pain and/or Shortness of Breath. Medicated her with Tylenol 650mg . Dr. [name] Benton paged and he assessed patient.  Call light is within reach.  Encouraged her to use call light for assistance in bathroom needs and any ambulation, she agreed to do so.  Will continue to monitor throughout shift.

## 2011-05-07 ENCOUNTER — Inpatient Hospital Stay (HOSPITAL_COMMUNITY): Payer: Self-pay

## 2011-05-07 ENCOUNTER — Encounter (HOSPITAL_COMMUNITY): Payer: Self-pay

## 2011-05-07 DIAGNOSIS — R079 Chest pain, unspecified: Secondary | ICD-10-CM

## 2011-05-07 MED ORDER — CLONAZEPAM 0.5 MG PO TABS: 0.5000 mg | ORAL_TABLET | Freq: Two times a day (BID) | ORAL | Status: DC | PRN

## 2011-05-07 MED ORDER — TECHNETIUM TC 99M TETROFOSMIN IV KIT
10.0000 | PACK | Freq: Once | INTRAVENOUS | Status: AC | PRN
  Administered 2011-05-07: 9 via INTRAVENOUS

## 2011-05-07 MED ORDER — SODIUM CHLORIDE 0.9 % IJ SOLN
INTRAMUSCULAR | Status: AC
  Administered 2011-05-07: 10 mL via INTRAVENOUS
  Filled 2011-05-07: qty 10

## 2011-05-07 MED ORDER — REGADENOSON 0.4 MG/5ML IV SOLN
0.4000 mg | Freq: Once | INTRAVENOUS | Status: DC
  Filled 2011-05-07: qty 5

## 2011-05-07 MED ORDER — PANTOPRAZOLE SODIUM 40 MG PO TBEC: 40.0000 mg | DELAYED_RELEASE_TABLET | Freq: Every day | ORAL | Status: DC

## 2011-05-07 MED ORDER — ALBUTEROL SULFATE HFA 108 (90 BASE) MCG/ACT IN AERS: 2.0000 | INHALATION_SPRAY | Freq: Four times a day (QID) | RESPIRATORY_TRACT | Status: DC | PRN

## 2011-05-07 MED ORDER — REGADENOSON 0.4 MG/5ML IV SOLN
INTRAVENOUS | Status: AC
  Administered 2011-05-07: 0.4 mg via INTRAVENOUS
  Filled 2011-05-07: qty 5

## 2011-05-07 MED ORDER — TECHNETIUM TC 99M TETROFOSMIN IV KIT
30.0000 | PACK | Freq: Once | INTRAVENOUS | Status: AC | PRN
  Administered 2011-05-07: 30 via INTRAVENOUS

## 2011-05-07 NOTE — Discharge Instructions (Signed)
Chest Pain (Nonspecific) It is often hard to give a specific diagnosis for the cause of chest pain. There is always a chance that your pain could be related to something serious, such as a heart attack or a blood clot in the lungs. You need to follow up with your caregiver for further evaluation. CAUSES   Heartburn.   Pneumonia or bronchitis.   Anxiety or stress.   Inflammation around your heart (pericarditis) or lung (pleuritis or pleurisy).   A blood clot in the lung.   A collapsed lung (pneumothorax). It can develop suddenly on its own (spontaneous pneumothorax) or from injury (trauma) to the chest.   Shingles infection (herpes zoster virus).  The chest wall is composed of bones, muscles, and cartilage. Any of these can be the source of the pain.  The bones can be bruised by injury.   The muscles or cartilage can be strained by coughing or overwork.   The cartilage can be affected by inflammation and become sore (costochondritis).  DIAGNOSIS  Lab tests or other studies, such as X-rays, electrocardiography, stress testing, or cardiac imaging, may be needed to find the cause of your pain.  TREATMENT   Treatment depends on what may be causing your chest pain. Treatment may include:   Acid blockers for heartburn.   Anti-inflammatory medicine.   Pain medicine for inflammatory conditions.   Antibiotics if an infection is present.   You may be advised to change lifestyle habits. This includes stopping smoking and avoiding alcohol, caffeine, and chocolate.   You may be advised to keep your head raised (elevated) when sleeping. This reduces the chance of acid going backward from your stomach into your esophagus.   Most of the time, nonspecific chest pain will improve within 2 to 3 days with rest and mild pain medicine.  HOME CARE INSTRUCTIONS   If antibiotics were prescribed, take your antibiotics as directed. Finish them even if you start to feel better.   For the next few  days, avoid physical activities that bring on chest pain. Continue physical activities as directed.   Do not smoke.   Avoid drinking alcohol.   Only take over-the-counter or prescription medicine for pain, discomfort, or fever as directed by your caregiver.   Follow your caregiver's suggestions for further testing if your chest pain does not go away.   Keep any follow-up appointments you made. If you do not go to an appointment, you could develop lasting (chronic) problems with pain. If there is any problem keeping an appointment, you must call to reschedule.  SEEK MEDICAL CARE IF:   You think you are having problems from the medicine you are taking. Read your medicine instructions carefully.   Your chest pain does not go away, even after treatment.   You develop a rash with blisters on your chest.  SEEK IMMEDIATE MEDICAL CARE IF:   You have increased chest pain or pain that spreads to your arm, neck, jaw, back, or abdomen.   You develop shortness of breath, an increasing cough, or you are coughing up blood.   You have severe back or abdominal pain, feel nauseous, or vomit.   You develop severe weakness, fainting, or chills.   You have a fever.  THIS IS AN EMERGENCY. Do not wait to see if the pain will go away. Get medical help at once. Call your local emergency services (911 in [place] Do not drive yourself to the hospital. MAKE SURE YOU:   Understand these instructions.     Will watch your condition.   Will get help right away if you are not doing well or get worse.  Document Released: 11/27/2004 Document Revised: 02/06/2011 Document Reviewed: 09/23/2007 ExitCare Patient Information 2012 [employer]. 

## 2011-05-07 NOTE — Progress Notes (Signed)
Stress Lab Nurses Notes - Jeani Hawking  Dominick Zertuche 05/07/2011  Reason for doing test: Chest Pain Type of test: Steffanie Dunn Nurse performing test: Parke Poisson, RN Nuclear Medicine Tech: Lyndel Pleasure Echo Tech: Not Applicable MD performing test: P. Nishan & Joni Reining NP Family MD: NPCP Test explained and consent signed: yes IV started: 20g jelco, Saline lock flushed, IV in progress from floor and No redness or edema Symptoms: SOB & "feeling weird" Treatment/Intervention: None Reason test stopped: protocol completed After recovery IV was: Left intact (rate of 75) and No redness or edema Patient to return to Nuc. Med at : 9:45 am Patient discharged: Transported back to room 213 via wc Patient's Condition upon discharge was: stable Comments: During test BP 138/82 &HR 112.  Recovery BP 132/82 & HR 85.  Symptoms resolved in recovery. Erskine Speed T

## 2011-05-07 NOTE — Progress Notes (Signed)
Pt discharged home today per Dr. Kerry Hough. Pt's IV site D/C'd and WNL. Pt's VS stable at this time. Pt provided with home medication list, discharge instructions, and prescriptions. Pt verbalized understanding. Pt left floor via WC in stable condition accompanied by NT.

## 2011-05-07 NOTE — Plan of Care (Signed)
Problem: Phase I Progression Outcomes Goal: Pain controlled with appropriate interventions Outcome: Progressing Scheduled medications have been effective pain control on this shift.  Goal: OOB as tolerated unless otherwise ordered Outcome: Completed/Met Date Met:  05/07/11 Pt ambulates in her room

## 2011-05-07 NOTE — Progress Notes (Addendum)
SUBJECTIVE:No complaints of chest pain.  Active Problems:  Precordial pain  Essential hypertension, benign  Tobacco abuse disorder  Hyperlipidemia  COPD (chronic obstructive pulmonary disease)   LABS: Basic Metabolic Panel:  Basename 05/05/11 2140 05/05/11 1559  NA -- 139  K -- 3.7  CL -- 102  CO2 -- 27  GLUCOSE -- 99  BUN -- 15  CREATININE -- 0.66  CALCIUM -- 10.0  MG 2.2 --  PHOS 4.1 --   Liver Function Tests:  Basename 05/05/11 1559  AST 13  ALT 12  ALKPHOS 79  BILITOT 0.1*  PROT 7.1  ALBUMIN 3.8   CBC:  Basename 05/05/11 1559  WBC 9.5  NEUTROABS 6.0  HGB 13.7  HCT 41.7  MCV 91.9  PLT 332   Cardiac Enzymes:  Basename 05/06/11 1257 05/06/11 0622 05/05/11 2140  CKTOTAL 54 57 75  CKMB 1.6 1.6 1.7  CKMBINDEX -- -- --  TROPONINI <0.30 <0.30 <0.30   Fasting Lipid Panel:  Basename 05/06/11 0621  CHOL 165  HDL 35*  LDLCALC 91  TRIG 161*  CHOLHDL 4.7  LDLDIRECT --   Thyroid Function Tests:  Basename 05/05/11 2140  TSH 2.929  T4TOTAL --  T3FREE --  THYROIDAB --    RADIOLOGY: Dg Chest 2 View  05/05/2011  *RADIOLOGY REPORT*  Clinical Data: Chest pain  CHEST - 2 VIEW  Comparison: 06/22/2006  Findings: Lungs are mildly hyperexpanded. The lungs are clear without focal infiltrate, edema, pneumothorax or pleural effusion. The cardiopericardial silhouette is within normal limits for size. Telemetry leads overlie the chest.  IMPRESSION: Mild hyperexpansion without acute cardiopulmonary findings.  Original Report Authenticated By: ERIC A. MANSELL, M.D.     PHYSICAL EXAM BP 105/71  Pulse 62  Temp(Src) 98.2 F (36.8 C) (Oral)  Resp 18  Ht 5\' 5"  (1.651 m)  Wt 159 lb 9.8 oz (72.4 kg)  BMI 26.56 kg/m2  SpO2 93% General: Well developed, well nourished, in no acute distress Head: Eyes PERRLA, No xanthomas.   Normal cephalic and atramatic  Lungs: Clear bilaterally to auscultation and percussion. Heart: HRRR S1 S2, No MRG .  Pulses are 2+ & equal.         No carotid bruit. No JVD.  No abdominal bruits. No femoral bruits. Abdomen: Bowel sounds are positive, abdomen soft and non-tender without masses or                  Hernia's noted. Msk:  Back normal, normal gait. Normal strength and tone for age. Extremities: No clubbing, cyanosis or edema.  DP +1 Neuro: Alert and oriented X 3. Psych:  Flat affect, responds appropriately  TELEMETRY: Reviewed telemetry pt in: NSR  ASSESSMENT AND PLAN:  1. Atypical chest pain: Without further complaint at this time. She is assessed in stress lab prior to undergoing lexiscan myoview. Await results, if negative can return home.  2. Hypertension: BP well controlled on current medications. No changes at this time. 1.   Bettey Mare. Lyman Bishop NP [facility] 05/07/2011, 10:26 AM  Patient examined chart reviewed.  Suspect myovue will be normal.  Will try to read before leaving for [facility] this afternoon. Charlton Haws 11:11 AM 05/07/2011

## 2011-05-07 NOTE — Discharge Summary (Signed)
Physician Discharge Summary  Patient ID: [name] MRN: [id number] DOB/AGE: 1955/11/01 56 y.o.  Admit date: 05/05/2011 Discharge date: 05/07/2011  Primary Care Physician:  No primary provider on file.[facility]   Discharge Diagnoses:    Active Problems:  Chest pain, likely musculoskeletal, stress test negative  Essential hypertension, benign  Tobacco abuse disorder  Hyperlipidemia  COPD (chronic obstructive pulmonary disease)  Anxiety   Medication List  As of 05/07/2011  2:08 PM   TAKE these medications         albuterol 108 (90 BASE) MCG/ACT inhaler   Commonly known as: PROVENTIL HFA;VENTOLIN HFA   Inhale 2 puffs into the lungs every 6 (six) hours as needed for wheezing.      aspirin EC 81 MG tablet   Take 81 mg by mouth daily.      budesonide-formoterol 160-4.5 MCG/ACT inhaler   Commonly known as: SYMBICORT   Inhale 2 puffs into the lungs 2 (two) times daily.      clonazePAM 0.5 MG tablet   Commonly known as: KLONOPIN   Take 1 tablet (0.5 mg total) by mouth 2 (two) times daily as needed for anxiety.      fish oil-omega-3 fatty acids 1000 MG capsule   Take 1 g by mouth 2 (two) times daily.      HYDROcodone-acetaminophen 10-325 MG per tablet   Commonly known as: NORCO   Take 1 tablet by mouth 4 (four) times daily.      lisinopril 20 MG tablet   Commonly known as: PRINIVIL,ZESTRIL   Take 20 mg by mouth daily.      pantoprazole 40 MG tablet   Commonly known as: PROTONIX   Take 1 tablet (40 mg total) by mouth daily at 12 noon.      pravastatin 40 MG tablet   Commonly known as: PRAVACHOL   Take 40 mg by mouth at bedtime.           Discharge Exam: Blood pressure 105/71, pulse 62, temperature 98.2 F (36.8 C), temperature source Oral, resp. rate 18, height 5\' 5"  (1.651 m), weight 72.4 kg (159 lb 9.8 oz), SpO2 93.00%. NAD CTA B S1, S2, RRR Soft, NT, BS+ No edema b/l  Disposition and Follow-up:  Follow up with primary doctor in 2 weeks  Consults:   Cardiology, [place]   Significant Diagnostic Studies:  Dg Chest 2 View  05/05/2011  *RADIOLOGY REPORT*  Clinical Data: Chest pain  CHEST - 2 VIEW  Comparison: 06/22/2006  Findings: Lungs are mildly hyperexpanded. The lungs are clear without focal infiltrate, edema, pneumothorax or pleural effusion. The cardiopericardial silhouette is within normal limits for size. Telemetry leads overlie the chest.  IMPRESSION: Mild hyperexpansion without acute cardiopulmonary findings.  Original Report Authenticated By: ERIC A. MANSELL, M.D.    Brief H and P: For complete details please refer to admission H and P, but in brief [name] reports chest pain which started this morning, central, pressure like 8/10 in intensity, radiates to the left shoulder. She has cough, which she says is chronic. No fever. Pain, worse when he changes position in bed. Constant. Says she hasn't received any pain meds in ED. Denies nausea or diaphoresis. Cxr/cardiac enzymes/ekg unremarkable so far. Had stress test/cardiac cath 6 years, insignificant vessel disease being treated medically.   Hospital Course:  Patient was admitted to the hospital for further evaluation of chest pain.  She ruled out for acute MI with negative cardiac enzymes and no acute EKG.  She was seen  by [facility] cardiology and underwent stress test.  This was considered to a low risk myovue, so it was recommended to continue current treatment and that patient could follow up with her primary doctor.  Her pain is likely musculoskeletal, since on my exam today it is reproducible with palpation.  The remainder of her medical issues have been stable.  Time spent on Discharge:  Signed: Tasia Liz [facility] Pager: [phone] 05/07/2011, 2:08 PM

## 2011-05-27 ENCOUNTER — Encounter: Payer: Self-pay | Admitting: Physician Assistant

## 2011-09-05 ENCOUNTER — Encounter: Payer: Self-pay | Admitting: Cardiology

## 2011-10-27 ENCOUNTER — Encounter (HOSPITAL_COMMUNITY): Payer: Self-pay | Admitting: Cardiovascular Disease

## 2012-02-02 ENCOUNTER — Emergency Department (HOSPITAL_COMMUNITY): Payer: Medicaid Other

## 2012-02-02 ENCOUNTER — Emergency Department (HOSPITAL_COMMUNITY)
Admission: EM | Admit: 2012-02-02 | Discharge: 2012-02-02 | Disposition: A | Discharge status: Home / Self Care | Payer: Medicaid Other | Attending: Emergency Medicine | Admitting: Emergency Medicine

## 2012-02-02 ENCOUNTER — Encounter (HOSPITAL_COMMUNITY): Payer: Self-pay | Admitting: *Deleted

## 2012-02-02 DIAGNOSIS — Z859 Personal history of malignant neoplasm, unspecified: Secondary | ICD-10-CM | POA: Insufficient documentation

## 2012-02-02 DIAGNOSIS — I2510 Atherosclerotic heart disease of native coronary artery without angina pectoris: Secondary | ICD-10-CM | POA: Insufficient documentation

## 2012-02-02 DIAGNOSIS — I10 Essential (primary) hypertension: Secondary | ICD-10-CM | POA: Insufficient documentation

## 2012-02-02 DIAGNOSIS — F329 Major depressive disorder, single episode, unspecified: Secondary | ICD-10-CM | POA: Insufficient documentation

## 2012-02-02 DIAGNOSIS — F17200 Nicotine dependence, unspecified, uncomplicated: Secondary | ICD-10-CM | POA: Insufficient documentation

## 2012-02-02 DIAGNOSIS — Z7982 Long term (current) use of aspirin: Secondary | ICD-10-CM | POA: Insufficient documentation

## 2012-02-02 DIAGNOSIS — E7800 Pure hypercholesterolemia, unspecified: Secondary | ICD-10-CM | POA: Insufficient documentation

## 2012-02-02 DIAGNOSIS — F3289 Other specified depressive episodes: Secondary | ICD-10-CM | POA: Insufficient documentation

## 2012-02-02 DIAGNOSIS — J4489 Other specified chronic obstructive pulmonary disease: Secondary | ICD-10-CM | POA: Insufficient documentation

## 2012-02-02 DIAGNOSIS — Z8742 Personal history of other diseases of the female genital tract: Secondary | ICD-10-CM | POA: Insufficient documentation

## 2012-02-02 DIAGNOSIS — J449 Chronic obstructive pulmonary disease, unspecified: Secondary | ICD-10-CM | POA: Insufficient documentation

## 2012-02-02 DIAGNOSIS — Z8739 Personal history of other diseases of the musculoskeletal system and connective tissue: Secondary | ICD-10-CM | POA: Insufficient documentation

## 2012-02-02 DIAGNOSIS — F410 Panic disorder [episodic paroxysmal anxiety] without agoraphobia: Secondary | ICD-10-CM | POA: Insufficient documentation

## 2012-02-02 DIAGNOSIS — Z79899 Other long term (current) drug therapy: Secondary | ICD-10-CM | POA: Insufficient documentation

## 2012-02-02 DIAGNOSIS — R079 Chest pain, unspecified: Secondary | ICD-10-CM | POA: Insufficient documentation

## 2012-02-02 LAB — BASIC METABOLIC PANEL
CO2: 27 mEq/L (ref 19–32)
Calcium: 8.8 mg/dL (ref 8.4–10.5)
Creatinine, Ser: 0.84 mg/dL (ref 0.50–1.10)
Glucose, Bld: 99 mg/dL (ref 70–99)

## 2012-02-02 LAB — CBC WITH DIFFERENTIAL/PLATELET
Basophils Absolute: 0 10*3/uL (ref 0.0–0.1)
Eosinophils Relative: 1 % (ref 0–5)
HCT: 40.6 % (ref 36.0–46.0)
Hemoglobin: 14 g/dL (ref 12.0–15.0)
Lymphocytes Relative: 32 % (ref 12–46)
MCV: 90.8 fL (ref 78.0–100.0)
Monocytes Absolute: 0.6 10*3/uL (ref 0.1–1.0)
RBC: 4.47 MIL/uL (ref 3.87–5.11)
RDW: 13.5 % (ref 11.5–15.5)

## 2012-02-02 LAB — TROPONIN I: Troponin I: <0.3 ng/mL (ref <0.30)

## 2012-02-02 NOTE — ED Notes (Signed)
MD at bedside. 

## 2012-02-02 NOTE — ED Notes (Signed)
Pt came from [facility] orthopedics for an appt concerning her neck, c/o chest pain that started this am, given 325 asa per ems

## 2012-02-02 NOTE — ED Notes (Signed)
ZOX:[id number]<AV> Expected date:02/02/12<BR> Expected time:12:08 PM<BR> Means of arrival:Ambulance<BR> Comments:<BR> 56yoF/CP

## 2012-02-02 NOTE — ED Provider Notes (Addendum)
History     CSN: [id number]  Arrival date & time 02/02/12  1216   First MD Initiated Contact with Patient 02/02/12 1223      Chief Complaint  Patient presents with  . Chest Pain    (Consider location/radiation/quality/duration/timing/severity/associated sxs/prior treatment) HPI Comments: Pt comes in with cc of chest pain. No hx of known CAD. Does have hx of HTN, cancer Pt reports that she started having some chest pain while at the appointment with her neck doctor. She started having left sided pain, sharp. No n/v/f/c/sob. Pt did feel little dizzy and sweaty. She has had hx of chest pain before. She hd a negative CATH per [place] evaluation in 05/2011, 2 years back. She had a MPI in 05/2011 that didn't show any ischemia.   Patient is a 56 y.o. female presenting with chest pain. The history is provided by the patient.  Chest Pain     Past Medical History  Diagnosis Date  . COPD (chronic obstructive pulmonary disease)   . Hypertension   . Cancer   . Hypercholesteremia   . Depression   . Arthritis   . Tobacco user   . Perimenopausal   . Coronary artery disease     minimal nonobstructive   . Panic attacks     Past Surgical History  Procedure Date  . Abdominal hysterectomy   . Vocal cord     polyp removal    Family History  Problem Relation Age of Onset  . Heart disease Mother   . Heart attack Mother   . Heart attack Father   . Heart attack Brother     History  Substance Use Topics  . Smoking status: Current Every Day Smoker  . Smokeless tobacco: Not on file  . Alcohol Use: No    OB History    Grav Para Term Preterm Abortions TAB SAB Ect Mult Living                  Review of Systems  Cardiovascular: Positive for chest pain.    Allergies  Review of patient's allergies indicates no known allergies.  Home Medications   Current Outpatient Rx  Name  Route  Sig  Dispense  Refill  . ALBUTEROL SULFATE HFA 108 (90 BASE) MCG/ACT IN AERS   Inhalation   Inhale 2 puffs into the lungs every 6 (six) hours as needed for wheezing.   1 Inhaler   0   . ASPIRIN EC 81 MG PO TBEC   Oral   Take 81 mg by mouth daily.         . BUDESONIDE-FORMOTEROL FUMARATE 160-4.5 MCG/ACT IN AERO   Inhalation   Inhale 2 puffs into the lungs 2 (two) times daily.         . OMEGA-3 FATTY ACIDS 1000 MG PO CAPS   Oral   Take 1 g by mouth daily.          Marland Kitchen HYDROCODONE-ACETAMINOPHEN 10-325 MG PO TABS   Oral   Take 1 tablet by mouth 4 (four) times daily. Pain in neck and back         . LISINOPRIL 20 MG PO TABS   Oral   Take 20 mg by mouth daily.         Marland Kitchen PANTOPRAZOLE SODIUM 40 MG PO TBEC   Oral   Take 1 tablet (40 mg total) by mouth daily at 12 noon.   30 tablet   1   . PRAVASTATIN SODIUM 40 MG  PO TABS   Oral   Take 40 mg by mouth at bedtime.         Marland Kitchen CLONAZEPAM 0.5 MG PO TABS   Oral   Take 0.5 mg by mouth 2 (two) times daily as needed. anxiety           BP 122/64  Pulse 71  Temp 98.8 F (37.1 C) (Oral)  Resp 22  SpO2 94%  Physical Exam  Nursing note and vitals reviewed. Constitutional: She is oriented to person, place, and time. She appears well-developed.  HENT:  Head: Normocephalic and atraumatic.  Eyes: Conjunctivae normal and EOM are normal. Pupils are equal, round, and reactive to light.  Neck: Normal range of motion. Neck supple. No JVD present.  Cardiovascular: Normal rate, regular rhythm and normal heart sounds.   Pulmonary/Chest: Effort normal and breath sounds normal. No respiratory distress.  Abdominal: Soft. Bowel sounds are normal. She exhibits no distension. There is no tenderness. There is no rebound and no guarding.  Neurological: She is alert and oriented to person, place, and time.  Skin: Skin is warm and dry.    ED Course  Procedures (including critical care time)   Labs Reviewed  CBC WITH DIFFERENTIAL  BASIC METABOLIC PANEL  TROPONIN I   No results found.   No diagnosis found.    MDM    Date: 02/02/2012  Rate: 67  Rhythm: normal sinus rhythm  QRS Axis: normal  Intervals: normal  ST/T Wave abnormalities: normal  Conduction Disutrbances: none  Narrative Interpretation: unremarkable  Differential diagnosis includes: ACS syndrome CHF exacerbation Valvular disorder Myocarditis Pericarditis Pericardial effusion Pneumonia Pleural effusion Pulmonary edema PE Anemia  Pt comes in with cc of chest pain. Pt has no hx of CAD. Her cardiac risk factors are HTT and age. She had a negative cath in 2011 and a negative MPI in 2013 - so i feel comfortable with  2 troponins for our evaluation on the cardiac side.  She has no pleuritic component to the chest pain, and is not SOB, and not tachycardic - so CXR will be suffice on the pulmonary evaluation.     Derwood Kaplan, MD 02/02/12 1636  Derwood Kaplan, MD 02/02/12 1800

## 2012-03-24 ENCOUNTER — Ambulatory Visit (INDEPENDENT_AMBULATORY_CARE_PROVIDER_SITE_OTHER): Payer: Medicaid Other | Admitting: Physician Assistant

## 2012-03-24 VITALS — BP 147/88 | HR 91 | Ht 65.0 in | Wt 152.8 lb

## 2012-03-24 DIAGNOSIS — E7800 Pure hypercholesterolemia, unspecified: Secondary | ICD-10-CM

## 2012-03-24 DIAGNOSIS — R0789 Other chest pain: Secondary | ICD-10-CM

## 2012-03-24 DIAGNOSIS — F17200 Nicotine dependence, unspecified, uncomplicated: Secondary | ICD-10-CM

## 2012-03-24 DIAGNOSIS — Z720 Tobacco use: Secondary | ICD-10-CM

## 2012-03-24 DIAGNOSIS — I10 Essential (primary) hypertension: Secondary | ICD-10-CM

## 2012-03-24 MED ORDER — AMLODIPINE BESYLATE 5 MG PO TABS: 5.0000 mg | ORAL_TABLET | Freq: Every day | ORAL | Status: DC

## 2012-03-24 NOTE — Assessment & Plan Note (Signed)
Patient returns with no suggestion of acceleration or significant change in her pattern, previously assessed as atypical. She had nonobstructive CAD, by cardiac catheterization in 2008. I strongly advised her to stop smoking tobacco, and to continue on low-dose ASA and a statin. No further workup currently indicated.

## 2012-03-24 NOTE — Patient Instructions (Signed)
   Stop Tobacco  Begin Norvasc 5mg  daily  Follow up with primary MD in 2 weeks Continue all other current medications.  Follow up as needed

## 2012-03-24 NOTE — Progress Notes (Signed)
Primary Cardiologist: Marca Ancona, MD (new)   HPI: Patient returns to clinic after a prolonged hiatus, last seen here 12/2009, by Dr. Andee Lineman, for followup of uncontrolled HTN. She has history of atypical chest pain with nonobstructive CAD and normal LVF, by cardiac catheterization in 2008.  She presents today for further recommendations regarding uncontrolled HTN. Her primary provider, Paulita Cradle, NP, recently retired and advised the patient to consider resuming her followup with [place]. She was recently seen at the clinic last week, and reportedly had a blood pressure in the 130/80 range. She herself cites readings at home with systolics as high as 160.  Clinically, she continues to report DOE, previously documented as chronic and secondary to COPD, with ongoing tobacco. She also has occasional CP, unchanged in its pattern since her last visit with [place].  She refers to being under significant stress, during which she notes her BP to be elevated. She also reports that she is on total disability, citing chronic back problems.  No Known Allergies  Current Outpatient Prescriptions  Medication Sig Dispense Refill  . albuterol (PROVENTIL HFA;VENTOLIN HFA) 108 (90 BASE) MCG/ACT inhaler Inhale 2 puffs into the lungs every 6 (six) hours as needed for wheezing.  1 Inhaler  0  . aspirin EC 81 MG tablet Take 81 mg by mouth daily.      . budesonide-formoterol (SYMBICORT) 160-4.5 MCG/ACT inhaler Inhale 2 puffs into the lungs 2 (two) times daily.      . clonazePAM (KLONOPIN) 0.5 MG tablet Take 0.5 mg by mouth 2 (two) times daily as needed. anxiety      . fish oil-omega-3 fatty acids 1000 MG capsule Take 1 g by mouth daily.       Marland Kitchen HYDROcodone-acetaminophen (NORCO) 10-325 MG per tablet Take 1 tablet by mouth 4 (four) times daily. Pain in neck and back      . lisinopril (PRINIVIL,ZESTRIL) 20 MG tablet Take 20 mg by mouth daily.      . pantoprazole (PROTONIX) 40 MG tablet Take 1 tablet (40 mg total) by mouth  daily at 12 noon.  30 tablet  1  . pravastatin (PRAVACHOL) 40 MG tablet Take 40 mg by mouth at bedtime.        Past Medical History  Diagnosis Date  . COPD (chronic obstructive pulmonary disease)   . Hypertension   . Cancer   . Hypercholesteremia   . Depression   . Arthritis   . Tobacco user   . Perimenopausal   . Coronary artery disease     minimal nonobstructive   . Panic attacks     Past Surgical History  Procedure Date  . Abdominal hysterectomy   . Vocal cord     polyp removal    History   Social History  . Marital Status: Married    Spouse Name: N/A    Number of Children: N/A  . 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 Smoking status: Current Every Day Smoker  . Smokeless tobacco: Not on file  . Alcohol Use: No  . Drug Use: No  . Sexually Active:    Other Topics Concern  . Not on file   Social History Narrative   [place] husbandUnder a lot of family stressGo[facility] to [facility].   Social History Narrative   [place] husbandUnder a lot of  family stressGoes to [facility].    Problem Relation Age of Onset  . Heart disease Mother   . Heart attack Mother   . Heart attack Father   . Heart attack Brother     ROS: no nausea, vomiting; no fever, chills; no melena, hematochezia; no claudication  PHYSICAL EXAM: BP 147/88  Pulse 91  Ht 5\' 5"  (1.651 m)  Wt 152 lb 12.8 oz (69.31 kg)  BMI 25.43 kg/m2 GENERAL: 57 year old female; NAD HEENT: NCAT, PERRLA, EOMI; sclera clear; no xanthelasma NECK: palpable bilateral carotid pulses, no bruits; no JVD; no TM LUNGS: CTA bilaterally CARDIAC: RRR (S1, S2); no significant murmurs; no rubs or gallops ABDOMEN: soft, non-tender; intact BS EXTREMETIES: no significant peripheral edema SKIN: warm/dry; no obvious rash/lesions MUSCULOSKELETAL: no joint deformity NEURO: no  focal deficit; mildly anxious   EKG:    ASSESSMENT & PLAN:  HYPERTENSION I recommended adding amlodipine 5 mg daily to her current antihypertensive regimen, which consists only of ACE inhibitor. Of note, she has been on this medication for several years, and at the same dose. Rather than increase this, I feel that amlodipine would be a good medication for her, not requiring followup blood work. I advised her to return to her primary care provider in 2 weeks, for followup BP check and further recommendations regarding management of her HTN. I also recommended that she return to our clinic here on an as-needed basis.  Atypical chest pain Patient returns with no suggestion of acceleration or significant change in her pattern, previously assessed as atypical. She had nonobstructive CAD, by cardiac catheterization in 2008. I strongly advised her to stop smoking tobacco, and to continue on low-dose ASA and a statin. No further workup currently indicated.  Tobacco abuse disorder As outlined above, patient strongly advised to stop smoking.  HYPERCHOLESTEROLEMIA Followed by primary M.D. Recommend LDL target of 100 or less, if feasible.    Gene Japhet Morgenthaler, PAC

## 2012-03-24 NOTE — Assessment & Plan Note (Signed)
Followed by primary M.D. Recommend LDL target of 100 or less, if feasible.

## 2012-03-24 NOTE — Assessment & Plan Note (Signed)
As outlined above, patient strongly advised to stop smoking.

## 2012-03-24 NOTE — Assessment & Plan Note (Signed)
I recommended adding amlodipine 5 mg daily to her current antihypertensive regimen, which consists only of ACE inhibitor. Of note, she has been on this medication for several years, and at the same dose. Rather than increase this, I feel that amlodipine would be a good medication for her, not requiring followup blood work. I advised her to return to her primary care provider in 2 weeks, for followup BP check and further recommendations regarding management of her HTN. I also recommended that she return to our clinic here on an as-needed basis.

## 2012-06-11 ENCOUNTER — Other Ambulatory Visit: Payer: Self-pay | Admitting: *Deleted

## 2012-06-11 MED ORDER — ATORVASTATIN CALCIUM 20 MG PO TABS: 20.0000 mg | ORAL_TABLET | Freq: Every day | ORAL | Status: DC

## 2012-06-16 ENCOUNTER — Other Ambulatory Visit: Payer: Self-pay | Admitting: *Deleted

## 2012-06-16 MED ORDER — CLONAZEPAM 0.5 MG PO TABS: 0.5000 mg | ORAL_TABLET | Freq: Two times a day (BID) | ORAL | Status: DC | PRN

## 2012-06-16 NOTE — Telephone Encounter (Signed)
Last filled 05/04/12, last seen 05/12/12. Call into [facility] [phone]

## 2012-06-16 NOTE — Telephone Encounter (Signed)
Ok to call in RX for clonazepam )refills

## 2012-06-17 NOTE — Telephone Encounter (Signed)
LINE BUSY- NA- 4/17-[facility] HAS BEEN CALLED INTO [facility] AT 9:57 4/17

## 2012-07-13 ENCOUNTER — Encounter: Payer: Self-pay | Admitting: Family Medicine

## 2012-07-13 ENCOUNTER — Ambulatory Visit (INDEPENDENT_AMBULATORY_CARE_PROVIDER_SITE_OTHER): Payer: Medicaid Other | Admitting: Family Medicine

## 2012-07-13 ENCOUNTER — Telehealth: Payer: Self-pay | Admitting: Nurse Practitioner

## 2012-07-13 VITALS — BP 110/64 | HR 87 | Temp 97.6°F | Ht 65.0 in | Wt 153.4 lb

## 2012-07-13 DIAGNOSIS — N390 Urinary tract infection, site not specified: Secondary | ICD-10-CM

## 2012-07-13 DIAGNOSIS — R300 Dysuria: Secondary | ICD-10-CM

## 2012-07-13 LAB — POCT URINALYSIS DIPSTICK
Bilirubin, UA: NEGATIVE
Blood, UA: NEGATIVE
Glucose, UA: NEGATIVE
Nitrite, UA: NEGATIVE
Spec Grav, UA: 1.02
pH, UA: 6

## 2012-07-13 LAB — POCT UA - MICROSCOPIC ONLY
Casts, Ur, LPF, POC: NEGATIVE
Crystals, Ur, HPF, POC: NEGATIVE
RBC, urine, microscopic: NEGATIVE

## 2012-07-13 MED ORDER — LORATADINE 10 MG PO TABS: 10.0000 mg | ORAL_TABLET | Freq: Every day | ORAL | Status: DC

## 2012-07-13 MED ORDER — PHENAZOPYRIDINE HCL 200 MG PO TABS: 200.0000 mg | ORAL_TABLET | Freq: Three times a day (TID) | ORAL | Status: DC | PRN

## 2012-07-13 MED ORDER — CIPROFLOXACIN HCL 500 MG PO TABS: 500.0000 mg | ORAL_TABLET | Freq: Two times a day (BID) | ORAL | Status: DC

## 2012-07-13 NOTE — Patient Instructions (Signed)
Drink plenty of fluids Take medications as directed Repeat urinalysis in 2 weeks

## 2012-07-13 NOTE — Addendum Note (Signed)
Addended by: Orma Render F on: 07/13/2012 06:16 PM   Modules accepted: Orders

## 2012-07-13 NOTE — Progress Notes (Signed)
  Subjective:    Patient ID: [name], female    DOB: Nov 05, 1955, 57 y.o.   MRN: [id number]  HPI Patient has had burning frequency and suprapubic pressure since this morning. She has a history of having urinary tract infections.   Review of Systems  Genitourinary: Positive for dysuria and frequency (x 1 day).  Neurological: Positive for headaches.       Objective:   Physical Exam  Vitals reviewed. Constitutional: She appears well-developed and well-nourished. No distress.  Abdominal: Soft. There is tenderness (suprapubic). There is no rebound and no guarding.  Skin: Skin is warm and dry.  Psychiatric: She has a normal mood and affect. Her behavior is normal. Judgment and thought content normal.    Results for orders placed in visit on 07/13/12  POCT URINALYSIS DIPSTICK      Result Value Range   Color, UA gold     Clarity, UA clear     Glucose, UA negative     Bilirubin, UA negative     Ketones, UA negative     Spec Grav, UA 1.020     Blood, UA negative     pH, UA 6.0     Protein, UA 2+     Urobilinogen, UA negative     Nitrite, UA negative     Leukocytes, UA moderate (2+)    POCT UA - MICROSCOPIC ONLY      Result Value Range   WBC, Ur, HPF, POC 20-40     RBC, urine, microscopic negative     Bacteria, U Microscopic many     Mucus, UA negative     Epithelial cells, urine per micros occ     Crystals, Ur, HPF, POC negative     Casts, Ur, LPF, POC negative     Yeast, UA negative           Assessment & Plan:  1. Dysuria - POCT urinalysis dipstick - POCT UA - Microscopic Only - phenazopyridine (PYRIDIUM) 200 MG tablet; Take 1 tablet (200 mg total) by mouth 3 (three) times daily as needed for pain.  Dispense: 10 tablet; Refill: 0  2. UTI (urinary tract infection) - ciprofloxacin (CIPRO) 500 MG tablet; Take 1 tablet (500 mg total) by mouth 2 (two) times daily.  Dispense: 20 tablet; Refill: 0 - phenazopyridine (PYRIDIUM) 200 MG tablet; Take 1 tablet (200 mg  total) by mouth 3 (three) times daily as needed for pain.  Dispense: 10 tablet; Refill: 0   Patient Instructions  Drink plenty of fluids Take medications as directed Repeat urinalysis in 2 weeks

## 2012-07-13 NOTE — Telephone Encounter (Signed)
appt made

## 2012-07-16 LAB — URINE CULTURE: Colony Count: 80000

## 2012-07-19 ENCOUNTER — Other Ambulatory Visit: Payer: Self-pay

## 2012-07-19 MED ORDER — ATORVASTATIN CALCIUM 20 MG PO TABS: 20.0000 mg | ORAL_TABLET | Freq: Every day | ORAL | Status: DC

## 2012-07-22 ENCOUNTER — Telehealth: Payer: Self-pay | Admitting: Nurse Practitioner

## 2012-07-22 NOTE — Telephone Encounter (Signed)
NTBS.

## 2012-07-23 NOTE — Telephone Encounter (Signed)
appt given for 07/27/2012 at 2:00 WITH MMM

## 2012-07-27 ENCOUNTER — Ambulatory Visit (INDEPENDENT_AMBULATORY_CARE_PROVIDER_SITE_OTHER): Payer: Medicaid Other | Admitting: Nurse Practitioner

## 2012-07-27 ENCOUNTER — Encounter: Payer: Self-pay | Admitting: Nurse Practitioner

## 2012-07-27 VITALS — BP 139/82 | HR 99 | Temp 97.5°F | Ht 65.0 in | Wt 155.0 lb

## 2012-07-27 DIAGNOSIS — F411 Generalized anxiety disorder: Secondary | ICD-10-CM

## 2012-07-27 DIAGNOSIS — R350 Frequency of micturition: Secondary | ICD-10-CM

## 2012-07-27 DIAGNOSIS — G4762 Sleep related leg cramps: Secondary | ICD-10-CM

## 2012-07-27 DIAGNOSIS — N390 Urinary tract infection, site not specified: Secondary | ICD-10-CM

## 2012-07-27 DIAGNOSIS — J441 Chronic obstructive pulmonary disease with (acute) exacerbation: Secondary | ICD-10-CM

## 2012-07-27 LAB — POCT URINALYSIS DIPSTICK
Blood, UA: NEGATIVE
Ketones, UA: NEGATIVE
Protein, UA: NEGATIVE
Spec Grav, UA: 1.02
Urobilinogen, UA: NEGATIVE
pH, UA: 5

## 2012-07-27 LAB — POCT UA - MICROSCOPIC ONLY
Casts, Ur, LPF, POC: NEGATIVE
Crystals, Ur, HPF, POC: NEGATIVE
Mucus, UA: NEGATIVE

## 2012-07-27 MED ORDER — BUDESONIDE-FORMOTEROL FUMARATE 160-4.5 MCG/ACT IN AERO: 2.0000 | INHALATION_SPRAY | Freq: Two times a day (BID) | RESPIRATORY_TRACT | Status: DC

## 2012-07-27 MED ORDER — CLONAZEPAM 0.5 MG PO TABS: 0.5000 mg | ORAL_TABLET | Freq: Two times a day (BID) | ORAL | Status: DC | PRN

## 2012-07-27 MED ORDER — CIPROFLOXACIN HCL 500 MG PO TABS: 500.0000 mg | ORAL_TABLET | Freq: Two times a day (BID) | ORAL | Status: DC

## 2012-07-27 NOTE — Patient Instructions (Signed)
Leg Cramps  Leg cramps that occur during exercise can be caused by poor circulation or dehydration. However, muscle cramps that occur at rest or during the night are usually not due to any serious medical problem. Heat cramps may cause muscle spasms during hot weather.   CAUSES  There is no clear cause for muscle cramps. However, dehydration may be a factor for those who do not drink enough fluids and those who exercise in the heat. Imbalances in the level of sodium, potassium, calcium or magnesium in the muscle tissue may also be a factor. Some medications, such as water pills (diuretics), may cause loss of chemicals that the body needs (like sodium and potassium) and cause muscle cramps.  TREATMENT   · Make sure your diet has enough fluids and essential minerals for the muscle to work normally.  · Avoid strenuous exercise for several days if you have been having frequent leg cramps.  · Stretch and massage the cramped muscle for several minutes.  · Some medicines may be helpful in some patients with night cramps. Only take over-the-counter or prescription medicines as directed by your caregiver.  SEEK IMMEDIATE MEDICAL CARE IF:   · Your leg cramps become worse.  · Your foot becomes cold, numb, or blue.  Document Released: 03/27/2004 Document Revised: 05/12/2011 Document Reviewed: 03/14/2008  Exit[employer]® Patient Information ©2014 [employer].

## 2012-07-27 NOTE — Progress Notes (Signed)
  Subjective:    Patient ID: [name], female    DOB: [birth year], 57 y.o.   MRN: [id number]  HPI- Patient in c/o lower extremity cramps- only occurs at night- started about 1 month ago- Occurs every night after going to bed. Says cramps are mainly down the front of legs or down sides- Nothing helps- nothing makes it worse- Patient discussed with pain pysician and he gave her a muscle relaxer which does not help at all. * Patient had a Uti 2 weeks ago- Still having dysuria-   Review of Systems  All other systems reviewed and are negative.       Objective:   Physical Exam  Constitutional: She is oriented to person, place, and time. She appears well-developed and well-nourished.  Cardiovascular: Normal rate and normal heart sounds.   Pulmonary/Chest: Effort normal and breath sounds normal.  Genitourinary:  No CVA tenderness  Musculoskeletal: Normal range of motion. She exhibits no edema and no tenderness.  Neurological: She is alert and oriented to person, place, and time. She has normal reflexes.    BP 139/82  Pulse 99  Temp(Src) 97.5 F (36.4 C) (Oral)  Ht 5\' 5"  (1.651 m)  Wt 155 lb (70.308 kg)  BMI 25.79 kg/m2 Results for orders placed in visit on 07/13/12  URINE CULTURE      Result Value Range   Culture ESCHERICHIA COLI     Colony Count 80,000 COLONIES/ML     Organism ID, Bacteria ESCHERICHIA COLI    POCT URINALYSIS DIPSTICK      Result Value Range   Color, UA gold     Clarity, UA clear     Glucose, UA negative     Bilirubin, UA negative     Ketones, UA negative     Spec Grav, UA 1.020     Blood, UA negative     pH, UA 6.0     Protein, UA 2+     Urobilinogen, UA negative     Nitrite, UA negative     Leukocytes, UA moderate (2+)    POCT UA - MICROSCOPIC ONLY      Result Value Range   WBC, Ur, HPF, POC 20-40     RBC, urine, microscopic negative     Bacteria, U Microscopic many     Mucus, UA negative     Epithelial cells, urine per micros occ     Crystals,  Ur, HPF, POC negative     Casts, Ur, LPF, POC negative     Yeast, UA negative           Assessment & Plan:  1. Frequent urination  - POCT UA - Microscopic Only - POCT urinalysis dipstick  2. UTI (urinary tract infection) Force fluids - ciprofloxacin (CIPRO) 500 MG tablet; Take 1 tablet (500 mg total) by mouth 2 (two) times daily.  Dispense: 20 tablet; Refill: 0  3. GAD (generalized anxiety disorder) Stress management - clonazePAM (KLONOPIN) 0.5 MG tablet; Take 1 tablet (0.5 mg total) by mouth 2 (two) times daily as needed. anxiety  Dispense: 60 tablet; Refill: 0  4. Leg cramps, sleep related Keep legs really warm - COMPLETE METABOLIC PANEL WITH GFR  5. COPD exacerbation Stop smoking - budesonide-formoterol (SYMBICORT) 160-4.5 MCG/ACT inhaler; Inhale 2 puffs into the lungs 2 (two) times daily.  Dispense: 1 Inhaler; Refill: 3  Mary-Margaret Daphine Deutscher, FNP

## 2012-07-28 LAB — COMPLETE METABOLIC PANEL WITH GFR
Alkaline Phosphatase: 68 U/L (ref 39–117)
BUN: 23 mg/dL (ref 6–23)
CO2: 29 mEq/L (ref 19–32)
GFR, Est African American: >89 mL/min
GFR, Est Non African American: >89 mL/min
Glucose, Bld: 73 mg/dL (ref 70–99)
Sodium: 142 mEq/L (ref 135–145)
Total Bilirubin: 0.3 mg/dL (ref 0.3–1.2)
Total Protein: 6.2 g/dL (ref 6.0–8.3)

## 2012-08-16 ENCOUNTER — Other Ambulatory Visit: Payer: Self-pay | Admitting: *Deleted

## 2012-08-16 MED ORDER — LISINOPRIL 20 MG PO TABS: 20.0000 mg | ORAL_TABLET | Freq: Every day | ORAL | Status: DC

## 2012-08-30 ENCOUNTER — Encounter: Payer: Self-pay | Admitting: Nurse Practitioner

## 2012-08-30 ENCOUNTER — Other Ambulatory Visit: Payer: Medicaid Other

## 2012-08-30 ENCOUNTER — Ambulatory Visit (INDEPENDENT_AMBULATORY_CARE_PROVIDER_SITE_OTHER): Payer: Medicaid Other | Admitting: Nurse Practitioner

## 2012-08-30 VITALS — BP 121/77 | HR 83 | Temp 98.5°F | Ht 65.5 in | Wt 156.0 lb

## 2012-08-30 DIAGNOSIS — M5137 Other intervertebral disc degeneration, lumbosacral region: Secondary | ICD-10-CM

## 2012-08-30 DIAGNOSIS — M5030 Other cervical disc degeneration, unspecified cervical region: Secondary | ICD-10-CM

## 2012-08-30 DIAGNOSIS — F411 Generalized anxiety disorder: Secondary | ICD-10-CM

## 2012-08-30 DIAGNOSIS — J441 Chronic obstructive pulmonary disease with (acute) exacerbation: Secondary | ICD-10-CM

## 2012-08-30 DIAGNOSIS — K219 Gastro-esophageal reflux disease without esophagitis: Secondary | ICD-10-CM

## 2012-08-30 DIAGNOSIS — E785 Hyperlipidemia, unspecified: Secondary | ICD-10-CM

## 2012-08-30 DIAGNOSIS — I10 Essential (primary) hypertension: Secondary | ICD-10-CM

## 2012-08-30 DIAGNOSIS — J309 Allergic rhinitis, unspecified: Secondary | ICD-10-CM

## 2012-08-30 DIAGNOSIS — M5136 Other intervertebral disc degeneration, lumbar region: Secondary | ICD-10-CM

## 2012-08-30 DIAGNOSIS — R238 Other skin changes: Secondary | ICD-10-CM

## 2012-08-30 MED ORDER — ATORVASTATIN CALCIUM 20 MG PO TABS: 20.0000 mg | ORAL_TABLET | Freq: Every day | ORAL | Status: DC

## 2012-08-30 MED ORDER — BUDESONIDE-FORMOTEROL FUMARATE 160-4.5 MCG/ACT IN AERO: 2.0000 | INHALATION_SPRAY | Freq: Two times a day (BID) | RESPIRATORY_TRACT | Status: DC

## 2012-08-30 MED ORDER — LORATADINE 10 MG PO TABS: 10.0000 mg | ORAL_TABLET | Freq: Every day | ORAL | Status: DC

## 2012-08-30 MED ORDER — AMLODIPINE BESYLATE 5 MG PO TABS: 5.0000 mg | ORAL_TABLET | Freq: Every day | ORAL | Status: DC

## 2012-08-30 MED ORDER — ALBUTEROL SULFATE HFA 108 (90 BASE) MCG/ACT IN AERS: 2.0000 | INHALATION_SPRAY | Freq: Four times a day (QID) | RESPIRATORY_TRACT | Status: DC | PRN

## 2012-08-30 MED ORDER — CLONAZEPAM 0.5 MG PO TABS: 0.5000 mg | ORAL_TABLET | Freq: Two times a day (BID) | ORAL | Status: DC | PRN

## 2012-08-30 MED ORDER — LISINOPRIL 20 MG PO TABS: 20.0000 mg | ORAL_TABLET | Freq: Every day | ORAL | Status: DC

## 2012-08-30 MED ORDER — PANTOPRAZOLE SODIUM 40 MG PO TBEC: 40.0000 mg | DELAYED_RELEASE_TABLET | Freq: Every day | ORAL | Status: DC

## 2012-08-30 NOTE — Progress Notes (Signed)
Subjective:    Patient ID: [name], female    DOB: 27-Jul-1955, 57 y.o.   MRN: [id number]  Hypertension This is a chronic problem. The current episode started more than 1 year ago. The problem has been resolved since onset. The problem is controlled. Pertinent negatives include no blurred vision, headaches, palpitations, peripheral edema or shortness of breath. There are no associated agents to hypertension. Risk factors for coronary artery disease include dyslipidemia, post-menopausal state and sedentary lifestyle. Past treatments include ACE inhibitors and calcium channel blockers. The current treatment provides significant improvement. Compliance problems include diet.   Hyperlipidemia This is a chronic problem. The current episode started more than 1 year ago. The problem is uncontrolled. Recent lipid tests were reviewed and are high. She has no history of diabetes, hypothyroidism, liver disease or obesity. There are no known factors aggravating her hyperlipidemia. Pertinent negatives include no shortness of breath. Current antihyperlipidemic treatment includes statins. Compliance problems include adherence to diet and adherence to exercise.   GAD Klonopin- BID- Keeps her controlled GERD Protonix works well to keep symptoms under controlled COPD Symbicort keeps her under control- very seldom has to take albuterol rescue inhaler. DDD neck and lumbar [place]- pain clinic in [place]  Review of Systems  Eyes: Negative for blurred vision.  Respiratory: Negative for shortness of breath.   Cardiovascular: Negative for palpitations.  Neurological: Negative for headaches.  All other systems reviewed and are negative.       Objective:   Physical Exam  Constitutional: She is oriented to person, place, and time. She appears well-developed and well-nourished.  HENT:  Nose: Nose normal.  Mouth/Throat: Oropharynx is clear and moist.  Eyes: EOM are normal.  Neck: Trachea normal,  normal range of motion and full passive range of motion without pain. Neck supple. No JVD present. Carotid bruit is not present. No thyromegaly present.  Cardiovascular: Normal rate, regular rhythm, normal heart sounds and intact distal pulses.  Exam reveals no gallop and no friction rub.   No murmur heard. Pulmonary/Chest: Effort normal. She has wheezes (faint bilateral bases).  Abdominal: Soft. Bowel sounds are normal. She exhibits no distension and no mass. There is no tenderness.  Musculoskeletal: Normal range of motion.  Lymphadenopathy:    She has no cervical adenopathy.  Neurological: She is alert and oriented to person, place, and time. She has normal reflexes.  Skin: Skin is warm and dry.  ecchymosis bil forearms   Psychiatric: She has a normal mood and affect. Her behavior is normal. Judgment and thought content normal.    BP 121/77  Pulse 83  Temp(Src) 98.5 F (36.9 C) (Oral)  Ht 5' 5.5" (1.664 m)  Wt 156 lb (70.761 kg)  BMI 25.56 kg/m2       Assessment & Plan:  1. GAD (generalized anxiety disorder) Stress management - clonazePAM (KLONOPIN) 0.5 MG tablet; Take 1 tablet (0.5 mg total) by mouth 2 (two) times daily as needed. anxiety  Dispense: 60 tablet; Refill: 1  2. COPD exacerbation STOP SMOKING - albuterol (PROVENTIL HFA;VENTOLIN HFA) 108 (90 BASE) MCG/ACT inhaler; Inhale 2 puffs into the lungs every 6 (six) hours as needed for wheezing.  Dispense: 1 Inhaler; Refill: 0 - budesonide-formoterol (SYMBICORT) 160-4.5 MCG/ACT inhaler; Inhale 2 puffs into the lungs 2 (two) times daily.  Dispense: 3 Inhaler; Refill: 1  3. Hypertension Low Na+ diet - COMPLETE METABOLIC PANEL WITH GFR - amLODipine (NORVASC) 5 MG tablet; Take 1 tablet (5 mg total) by mouth daily.  Dispense:  90 tablet; Refill: 1 - lisinopril (PRINIVIL,ZESTRIL) 20 MG tablet; Take 1 tablet (20 mg total) by mouth daily.  Dispense: 90 tablet; Refill: 1  4. Hyperlipidemia Low fat diet and exercise - NMR  Lipoprofile with Lipids - atorvastatin (LIPITOR) 20 MG tablet; Take 1 tablet (20 mg total) by mouth daily.  Dispense: 90 tablet; Refill: 1  5. DDD (degenerative disc disease), cervical Pain clinic  6. DDD (degenerative disc disease), lumbar Pain clinic  7. Easy bruising Labs pending - Anemia panel 7  8. GERD (gastroesophageal reflux disease) Avoid spicy and fatty foods Avoid exting 2 hours prior to bedtiom - pantoprazole (PROTONIX) 40 MG tablet; Take 1 tablet (40 mg total) by mouth daily at 12 noon.  Dispense: 30 tablet; Refill: 5  9. Allergic rhinitis  - loratadine (CLARITIN) 10 MG tablet; Take 1 tablet (10 mg total) by mouth daily.  Dispense: 30 tablet; Refill: prn  Mary-Margaret Daphine Deutscher, FNP

## 2012-08-30 NOTE — Patient Instructions (Signed)
Smoking Cessation Quitting smoking is important to your health and has many advantages. However, it is not always easy to quit since nicotine is a very addictive drug. Often times, people try 3 times or more before being able to quit. This document explains the best ways for you to prepare to quit smoking. Quitting takes hard work and a lot of effort, but you can do it. ADVANTAGES OF QUITTING SMOKING  You will live longer, feel better, and live better.  Your body will feel the impact of quitting smoking almost immediately.  Within 20 minutes, blood pressure decreases. Your pulse returns to its normal level.  After 8 hours, carbon monoxide levels in the blood return to normal. Your oxygen level increases.  After 24 hours, the chance of having a heart attack starts to decrease. Your breath, hair, and body stop smelling like smoke.  After 48 hours, damaged nerve endings begin to recover. Your sense of taste and smell improve.  After 72 hours, the body is virtually free of nicotine. Your bronchial tubes relax and breathing becomes easier.  After 2 to 12 weeks, lungs can hold more air. Exercise becomes easier and circulation improves.  The risk of having a heart attack, stroke, cancer, or lung disease is greatly reduced.  After 1 year, the risk of coronary heart disease is cut in half.  After 5 years, the risk of stroke falls to the same as a nonsmoker.  After 10 years, the risk of lung cancer is cut in half and the risk of other cancers decreases significantly.  After 15 years, the risk of coronary heart disease drops, usually to the level of a nonsmoker.  If you are pregnant, quitting smoking will improve your chances of having a healthy baby.  The people you live with, especially any children, will be healthier.  You will have extra money to spend on things other than cigarettes. QUESTIONS TO THINK ABOUT BEFORE ATTEMPTING TO QUIT You may want to talk about your answers with your  caregiver.  Why do you want to quit?  If you tried to quit in the past, what helped and what did not?  What will be the most difficult situations for you after you quit? How will you plan to handle them?  Who can help you through the tough times? Your family? Friends? A caregiver?  What pleasures do you get from smoking? What ways can you still get pleasure if you quit? Here are some questions to ask your caregiver:  How can you help me to be successful at quitting?  What medicine do you think would be best for me and how should I take it?  What should I do if I need more help?  What is smoking withdrawal like? How can I get information on withdrawal? GET READY  Set a quit date.  Change your environment by getting rid of all cigarettes, ashtrays, matches, and lighters in your home, car, or work. Do not let people smoke in your home.  Review your past attempts to quit. Think about what worked and what did not. GET SUPPORT AND ENCOURAGEMENT You have a better chance of being successful if you have help. You can get support in many ways.  Tell your family, friends, and co-workers that you are going to quit and need their support. Ask them not to smoke around you.  Get individual, group, or telephone counseling and support. Programs are available at local hospitals and health centers. Call your local health department for   information about programs in your area.  Spiritual beliefs and practices may help some smokers quit.  Download a "quit meter" on your computer to keep track of quit statistics, such as how long you have gone without smoking, cigarettes not smoked, and money saved.  Get a self-help book about quitting smoking and staying off of tobacco. LEARN NEW SKILLS AND BEHAVIORS  Distract yourself from urges to smoke. Talk to someone, go for a walk, or occupy your time with a task.  Change your normal routine. Take a different route to work. Drink tea instead of coffee.  Eat breakfast in a different place.  Reduce your stress. Take a hot bath, exercise, or read a book.  Plan something enjoyable to do every day. Reward yourself for not smoking.  Explore interactive web-based programs that specialize in helping you quit. GET MEDICINE AND USE IT CORRECTLY Medicines can help you stop smoking and decrease the urge to smoke. Combining medicine with the above behavioral methods and support can greatly increase your chances of successfully quitting smoking.  Nicotine replacement therapy helps deliver nicotine to your body without the negative effects and risks of smoking. Nicotine replacement therapy includes nicotine gum, lozenges, inhalers, nasal sprays, and skin patches. Some may be available over-the-counter and others require a prescription.  Antidepressant medicine helps people abstain from smoking, but how this works is unknown. This medicine is available by prescription.  Nicotinic receptor partial agonist medicine simulates the effect of nicotine in your brain. This medicine is available by prescription. Ask your caregiver for advice about which medicines to use and how to use them based on your health history. Your caregiver will tell you what side effects to look out for if you choose to be on a medicine or therapy. Carefully read the information on the package. Do not use any other product containing nicotine while using a nicotine replacement product.  RELAPSE OR DIFFICULT SITUATIONS Most relapses occur within the first 3 months after quitting. Do not be discouraged if you start smoking again. Remember, most people try several times before finally quitting. You may have symptoms of withdrawal because your body is used to nicotine. You may crave cigarettes, be irritable, feel very hungry, cough often, get headaches, or have difficulty concentrating. The withdrawal symptoms are only temporary. They are strongest when you first quit, but they will go away within  10 14 days. To reduce the chances of relapse, try to:  Avoid drinking alcohol. Drinking lowers your chances of successfully quitting.  Reduce the amount of caffeine you consume. Once you quit smoking, the amount of caffeine in your body increases and can give you symptoms, such as a rapid heartbeat, sweating, and anxiety.  Avoid smokers because they can make you want to smoke.  Do not let weight gain distract you. Many smokers will gain weight when they quit, usually less than 10 pounds. Eat a healthy diet and stay active. You can always lose the weight gained after you quit.  Find ways to improve your mood other than smoking. FOR MORE INFORMATION  www.smokefree.gov  Document Released: 02/11/2001 Document Revised: 08/19/2011 Document Reviewed: 05/29/2011 [employer] Patient Information 2014 [employer].  

## 2012-08-31 LAB — COMPLETE METABOLIC PANEL WITH GFR
Alkaline Phosphatase: 64 U/L (ref 39–117)
BUN: 17 mg/dL (ref 6–23)
Creat: 0.88 mg/dL (ref 0.50–1.10)
GFR, Est African American: 83 mL/min
GFR, Est Non African American: 72 mL/min
Glucose, Bld: 76 mg/dL (ref 70–99)
Sodium: 142 mEq/L (ref 135–145)
Total Bilirubin: 0.3 mg/dL (ref 0.3–1.2)
Total Protein: 6.1 g/dL (ref 6.0–8.3)

## 2012-08-31 LAB — ANEMIA PANEL 7
%SAT: 19 % — ABNORMAL LOW (ref 20–55)
Hemoglobin: 14.2 g/dL (ref 12.0–15.0)
Iron: 61 ug/dL (ref 42–145)
MCHC: 34.1 g/dL (ref 30.0–36.0)
Platelets: 310 10*3/uL (ref 150–400)
TIBC: 320 ug/dL (ref 250–470)
Vitamin B-12: 279 pg/mL (ref 211–911)
WBC: 10.5 10*3/uL (ref 4.0–10.5)

## 2012-09-01 ENCOUNTER — Other Ambulatory Visit: Payer: Self-pay | Admitting: Nurse Practitioner

## 2012-09-01 LAB — NMR LIPOPROFILE WITH LIPIDS
HDL Size: 8.4 nm — ABNORMAL LOW (ref >=9.2)
HDL-C: 41 mg/dL (ref >=40)
LDL (calc): 106 mg/dL — ABNORMAL HIGH (ref <100)
LDL Particle Number: 1909 nmol/L — ABNORMAL HIGH (ref <1000)
LDL Size: 19.9 nm — ABNORMAL LOW (ref >20.5)
LP-IR Score: 76 — ABNORMAL HIGH (ref <=45)
Triglycerides: 202 mg/dL — ABNORMAL HIGH (ref <150)
VLDL Size: 52.1 nm — ABNORMAL HIGH (ref <=46.6)

## 2012-09-01 MED ORDER — ATORVASTATIN CALCIUM 40 MG PO TABS: 40.0000 mg | ORAL_TABLET | Freq: Every day | ORAL | Status: DC

## 2012-10-02 ENCOUNTER — Encounter: Payer: Self-pay | Admitting: Family Medicine

## 2012-10-02 ENCOUNTER — Ambulatory Visit (INDEPENDENT_AMBULATORY_CARE_PROVIDER_SITE_OTHER): Payer: Medicaid Other | Admitting: Family Medicine

## 2012-10-02 VITALS — BP 123/66 | HR 84 | Temp 98.1°F | Wt 154.2 lb

## 2012-10-02 DIAGNOSIS — R21 Rash and other nonspecific skin eruption: Secondary | ICD-10-CM

## 2012-10-02 MED ORDER — PREDNISONE 10 MG PO TABS: 10.0000 mg | ORAL_TABLET | Freq: Every day | ORAL | Status: DC

## 2012-10-02 MED ORDER — METHYLPREDNISOLONE ACETATE 80 MG/ML IJ SUSP
60.0000 mg | Freq: Once | INTRAMUSCULAR | Status: AC
  Administered 2012-10-02: 60 mg via INTRAMUSCULAR

## 2012-10-02 NOTE — Patient Instructions (Signed)
James detergent to [employer] or [employer].  Do not use any lotion that has fragrance. Only usesent free fabric softener May continue to use [employer] or [place] Take medication as directed May continue to use Claritin Avoid sunlight as much as possible and hot baths 3 watch bed linen towels and clothing with milder detergent with scent free fabric softner

## 2012-10-02 NOTE — Progress Notes (Signed)
  Subjective:    Patient ID: [name], female    DOB: 1955-04-30, 57 y.o.   MRN: [id number]  HPI Patient comes in this morning complaining of arms being itchy irritated for about one week. It is especially been worse for the past couple of days. She has the history of using all other lady gain detergent and snuggled fabric softener. She uses [employer]. She denies shortness of breath chest pain or trouble swallowing.   Review of Systems  Constitutional: Negative for fever, appetite change and fatigue.  HENT: Negative.  Negative for ear pain, congestion, sore throat and sneezing.   Eyes: Negative.   Respiratory: Negative.  Negative for shortness of breath and wheezing.   Cardiovascular: Negative.  Negative for chest pain and palpitations.  Genitourinary: Negative.   Musculoskeletal: Negative.   Skin: Positive for rash (itching and burning,bilateral arms x 1 week).  Allergic/Immunologic: Negative.   Neurological: Negative.   Hematological: Negative.   Psychiatric/Behavioral: Negative.        Objective:   Physical Exam Patient is alert and communicative. On both arms she has excoriations and actually some bruising. The use of a fentanyl patch on her back since 2 months ago--there is no rash on her back where she normally applies the fentanyl patch.       Assessment & Plan:   1. Rash - methylPREDNISolone acetate (DEPO-MEDROL) injection 60 mg; Inject 0.75 mLs (60 mg total) into the muscle once. - predniSONE (DELTASONE) 10 MG tablet; Take 1 tablet (10 mg total) by mouth daily. 1 tablet QID x 3 days 1 tablet TID x 3 days 1 Tablet BID x 3 days 1 tablet QD x 3 days  Dispense: 30 tablet; Refill: 0  Patient Instructions  Fayrene Fearing detergent to [employer] or [employer].  Do not use any lotion that has fragrance. Only usesent free fabric softener May continue to use [employer] or [place] Take medication as directed May continue to use Claritin Avoid sunlight as much as possible and hot  baths 3 watch bed linen towels and clothing with milder detergent with scent free fabric softner    Nyra Capes MD

## 2012-10-07 ENCOUNTER — Telehealth: Payer: Self-pay | Admitting: Nurse Practitioner

## 2012-10-07 NOTE — Telephone Encounter (Signed)
Med not on her list who rx

## 2012-10-07 NOTE — Telephone Encounter (Signed)
i do not see this on med list or on last OV notes

## 2012-10-08 ENCOUNTER — Other Ambulatory Visit: Payer: Self-pay | Admitting: *Deleted

## 2012-10-08 MED ORDER — DULOXETINE HCL 30 MG PO CPEP: ORAL_CAPSULE | ORAL | Status: DC

## 2012-10-08 MED ORDER — DULOXETINE HCL 30 MG PO CPEP
30.0000 mg | ORAL_CAPSULE | Freq: Every day | ORAL | Status: DC
Start: 2012-10-08 — End: 2012-11-15

## 2012-10-08 NOTE — Telephone Encounter (Signed)
Pt left message that rx was sent to pharmacy

## 2012-10-08 NOTE — Telephone Encounter (Signed)
cymbalta rx sent to pharmacy

## 2012-10-08 NOTE — Telephone Encounter (Signed)
Received fax from [facility] in [place] requesting Cymbalta 30mg . States that the quantity requested is #9 and they need directions. Do not see on med list. After reviewing chart found where it was started on 11-13-11 and that she should be taking 3qd. Please advise

## 2012-10-08 NOTE — Telephone Encounter (Signed)
Patient said [name] put her on this about 2 years ago and looking back in her paper chart i see where [name] prescribed back in 11/2011. She says she 30mg  once a day

## 2012-10-08 NOTE — Telephone Encounter (Signed)
30 day supply of cymbalta sent to pharmacy 

## 2012-10-19 NOTE — Telephone Encounter (Signed)
Pt has script

## 2012-11-02 ENCOUNTER — Other Ambulatory Visit: Payer: Self-pay | Admitting: Nurse Practitioner

## 2012-11-03 ENCOUNTER — Telehealth: Payer: Self-pay | Admitting: Nurse Practitioner

## 2012-11-04 NOTE — Telephone Encounter (Signed)
Last seen 06/60/14, last filled 09/29/12. Route to pool B, if approved, so they can call into [facility] [phone]

## 2012-11-04 NOTE — Telephone Encounter (Signed)
Please call in rx for klonopin with 1 refill 

## 2012-11-05 NOTE — Telephone Encounter (Signed)
Called to [facility] in [place]

## 2012-11-05 NOTE — Telephone Encounter (Signed)
Called in by [place] [facility] today

## 2012-11-11 ENCOUNTER — Encounter: Payer: Self-pay | Admitting: Nurse Practitioner

## 2012-11-11 ENCOUNTER — Ambulatory Visit (INDEPENDENT_AMBULATORY_CARE_PROVIDER_SITE_OTHER): Payer: Medicaid Other | Admitting: Nurse Practitioner

## 2012-11-11 VITALS — BP 124/72 | HR 95 | Temp 97.4°F | Ht 65.5 in | Wt 149.0 lb

## 2012-11-11 DIAGNOSIS — H9209 Otalgia, unspecified ear: Secondary | ICD-10-CM

## 2012-11-11 DIAGNOSIS — H9201 Otalgia, right ear: Secondary | ICD-10-CM

## 2012-11-11 MED ORDER — FLUTICASONE PROPIONATE 50 MCG/ACT NA SUSP: 2.0000 | Freq: Every day | NASAL | Status: DC

## 2012-11-11 NOTE — Patient Instructions (Signed)

## 2012-11-11 NOTE — Progress Notes (Signed)
  Subjective:    Patient ID: [name], female    DOB: June 09, 1955, 57 y.o.   MRN: [id number]  HPI Patient in C/o right ear ache- said that she was yawning 3 days ago and felt a pop in her right ear and it has been hurting every since- Pain is constant and increasing in intensity. Got some OTC ear drops which helped some.    Review of Systems  Constitutional: Negative for fever.  HENT: Positive for ear pain. Negative for hearing loss, congestion, rhinorrhea, postnasal drip and ear discharge.   Respiratory: Negative.   Cardiovascular: Negative.   Neurological: Negative.        Objective:   Physical Exam  Constitutional: She appears well-developed and well-nourished.  HENT:  Right Ear: Hearing, external ear and ear canal normal. Tympanic membrane is not bulging. A middle ear effusion is present.  Left Ear: Hearing, tympanic membrane, external ear and ear canal normal.  Nose: Mucosal edema and rhinorrhea present. Right sinus exhibits no maxillary sinus tenderness and no frontal sinus tenderness. Left sinus exhibits no maxillary sinus tenderness.  Mouth/Throat: Uvula is midline, oropharynx is clear and moist and mucous membranes are normal.  Cardiovascular: Normal rate, regular rhythm and normal heart sounds.   Pulmonary/Chest: Effort normal and breath sounds normal.    BP 124/72  Pulse 95  Temp(Src) 97.4 F (36.3 C) (Oral)  Ht 5' 5.5" (1.664 m)  Wt 149 lb (67.586 kg)  BMI 24.41 kg/m2       Assessment & Plan:  1. Otalgia of right ear Do not put anything in ear canal OTC decongestant Force fluids Motrin or tylenol OTC for pain  - fluticasone (FLONASE) 50 MCG/ACT nasal spray; Place 2 sprays into the nose daily.  Dispense: 16 g; Refill: 6  Kerry Daphine Deutscher, FNP

## 2012-11-15 ENCOUNTER — Encounter: Payer: Self-pay | Admitting: Nurse Practitioner

## 2012-11-15 ENCOUNTER — Ambulatory Visit (INDEPENDENT_AMBULATORY_CARE_PROVIDER_SITE_OTHER): Payer: Medicaid Other | Admitting: Nurse Practitioner

## 2012-11-15 VITALS — BP 105/66 | HR 96 | Temp 97.8°F | Ht 66.0 in | Wt 148.0 lb

## 2012-11-15 DIAGNOSIS — F329 Major depressive disorder, single episode, unspecified: Secondary | ICD-10-CM

## 2012-11-15 DIAGNOSIS — Z0000 Encounter for general adult medical examination without abnormal findings: Secondary | ICD-10-CM

## 2012-11-15 DIAGNOSIS — J309 Allergic rhinitis, unspecified: Secondary | ICD-10-CM

## 2012-11-15 DIAGNOSIS — Z01419 Encounter for gynecological examination (general) (routine) without abnormal findings: Secondary | ICD-10-CM

## 2012-11-15 DIAGNOSIS — I10 Essential (primary) hypertension: Secondary | ICD-10-CM

## 2012-11-15 DIAGNOSIS — F411 Generalized anxiety disorder: Secondary | ICD-10-CM

## 2012-11-15 DIAGNOSIS — J441 Chronic obstructive pulmonary disease with (acute) exacerbation: Secondary | ICD-10-CM

## 2012-11-15 DIAGNOSIS — E785 Hyperlipidemia, unspecified: Secondary | ICD-10-CM

## 2012-11-15 DIAGNOSIS — G8929 Other chronic pain: Secondary | ICD-10-CM

## 2012-11-15 LAB — POCT URINALYSIS DIPSTICK
Protein, UA: NEGATIVE
Urobilinogen, UA: NEGATIVE
pH, UA: 6

## 2012-11-15 LAB — POCT CBC
Granulocyte percent: 71.6 %G (ref 37–80)
HCT, POC: 42.6 % (ref 37.7–47.9)
Hemoglobin: 14.3 g/dL (ref 12.2–16.2)
Lymph, poc: 2.6 (ref 0.6–3.4)
MCHC: 33.6 g/dL (ref 31.8–35.4)
MCV: 91.1 fL (ref 80–97)
MPV: 7.2 fL (ref 0–99.8)
POC Granulocyte: 6.9 (ref 2–6.9)
Platelet Count, POC: 268 10*3/uL (ref 142–424)
RBC: 4.7 M/uL (ref 4.04–5.48)
RDW, POC: 13.8 %
WBC: 9.7 10*3/uL (ref 4.6–10.2)

## 2012-11-15 MED ORDER — LISINOPRIL 20 MG PO TABS: 20.0000 mg | ORAL_TABLET | Freq: Every day | ORAL | Status: DC

## 2012-11-15 MED ORDER — LUBIPROSTONE 24 MCG PO CAPS: 24.0000 ug | ORAL_CAPSULE | Freq: Two times a day (BID) | ORAL | Status: DC

## 2012-11-15 MED ORDER — BUDESONIDE-FORMOTEROL FUMARATE 160-4.5 MCG/ACT IN AERO: 2.0000 | INHALATION_SPRAY | Freq: Two times a day (BID) | RESPIRATORY_TRACT | Status: DC

## 2012-11-15 MED ORDER — CLONAZEPAM 0.5 MG PO TABS: 0.5000 mg | ORAL_TABLET | Freq: Two times a day (BID) | ORAL | Status: DC | PRN

## 2012-11-15 MED ORDER — AMLODIPINE BESYLATE 5 MG PO TABS: 5.0000 mg | ORAL_TABLET | Freq: Every day | ORAL | Status: DC

## 2012-11-15 MED ORDER — ATORVASTATIN CALCIUM 40 MG PO TABS: 40.0000 mg | ORAL_TABLET | Freq: Every day | ORAL | Status: DC

## 2012-11-15 MED ORDER — DULOXETINE HCL 30 MG PO CPEP: 30.0000 mg | ORAL_CAPSULE | Freq: Every day | ORAL | Status: DC

## 2012-11-15 NOTE — Progress Notes (Signed)
Subjective:    Patient ID: [name], female    DOB: [birth year], 57 y.o.   MRN: [id number]  Patient here today for CPE and PAP- Currnet medical problems:  Hypertension The current episode started in the past 7 days. The problem is unchanged. The problem is controlled. Pertinent negatives include no blurred vision, chest pain, neck pain, orthopnea, palpitations, peripheral edema, PND or shortness of breath. There are no associated agents to hypertension. Risk factors for coronary artery disease include dyslipidemia and family history. Past treatments include ACE inhibitors and calcium channel blockers. The current treatment provides moderate improvement. There are no compliance problems.   Hyperlipidemia This is a chronic problem. The current episode started more than 1 year ago. The problem is controlled. Recent lipid tests were reviewed and are normal. She has no history of diabetes, hypothyroidism or obesity. There are no known factors aggravating her hyperlipidemia. Pertinent negatives include no chest pain or shortness of breath. Current antihyperlipidemic treatment includes statins. The current treatment provides significant improvement of lipids. Compliance problems include adherence to diet.  Risk factors for coronary artery disease include family history and hypertension.  GAD Klonopin BID- keeps her from being so anxious COPD Symbicort BID- hasn't needed albuterol inhaler much at all in the last several months Allergic rhinitis flonase and claritin working well has occasional episodes pf sneezing an dcongestion but for the most part doing well. Chronic low back pain Pain meds and cymbalta- gets from pain clinic- doing ok but stays in constant pain Depression Cymbalta- helps some with back pain but mainly keeps her from being sad and crying all the time.  * Pateint c/o constipation- has tried all OTC meds including miralax and still can only go maybe 1 x per week  Review of Systems   HENT: Negative for neck pain.   Eyes: Negative for blurred vision.  Respiratory: Negative for shortness of breath.   Cardiovascular: Negative for chest pain, palpitations, orthopnea and PND.       Objective:   Physical Exam  Constitutional: She is oriented to person, place, and time. She appears well-developed and well-nourished.  HENT:  Head: Normocephalic.  Right Ear: Hearing, tympanic membrane, external ear and ear canal normal.  Left Ear: Hearing, tympanic membrane, external ear and ear canal normal.  Nose: Nose normal.  Mouth/Throat: Uvula is midline and oropharynx is clear and moist.  Eyes: Conjunctivae and EOM are normal. Pupils are equal, round, and reactive to light.  Neck: Normal range of motion and full passive range of motion without pain. Neck supple. No JVD present. Carotid bruit is not present. No mass and no thyromegaly present.  Cardiovascular: Normal rate, normal heart sounds and intact distal pulses.   No murmur heard. Pulmonary/Chest: Effort normal and breath sounds normal. Right breast exhibits no inverted nipple, no mass, no nipple discharge, no skin change and no tenderness. Left breast exhibits no inverted nipple, no mass, no nipple discharge, no skin change and no tenderness.  Abdominal: Soft. Bowel sounds are normal. She exhibits no mass. There is no tenderness.  Genitourinary: Vagina normal and uterus normal. No breast swelling, tenderness, discharge or bleeding.  bimanual exam-No adnexal masses or tenderness. Vaginal cuff intact  Musculoskeletal: Normal range of motion.  Lymphadenopathy:    She has no cervical adenopathy.  Neurological: She is alert and oriented to person, place, and time.  Skin: Skin is warm and dry.  Psychiatric: She has a normal mood and affect. Her behavior is normal. Judgment and thought content  normal.   BP 105/66  Pulse 96  Temp(Src) 97.8 F (36.6 C) (Oral)  Ht 5\' 6"  (1.676 m)  Wt 148 lb (67.132 kg)  BMI 23.9 kg/m2         Assessment & Plan:  1. Routine physical examination  - POCT urinalysis dipstick - POCT CBC - Thyroid Panel With TSH  2. Hypertension Low NA+ diet - CMP14+EGFR - amLODipine (NORVASC) 5 MG tablet; Take 1 tablet (5 mg total) by mouth daily.  Dispense: 90 tablet; Refill: 1 - lisinopril (PRINIVIL,ZESTRIL) 20 MG tablet; Take 1 tablet (20 mg total) by mouth daily.  Dispense: 90 tablet; Refill: 1  3. Hyperlipidemia Low fat diet and exercise - NMR, lipoprofile - atorvastatin (LIPITOR) 40 MG tablet; Take 1 tablet (40 mg total) by mouth daily.  Dispense: 90 tablet; Refill: 1  4. Chronic back pain Keep appointment with pain management  5. GAD (generalized anxiety disorder) Stress management - clonazePAM (KLONOPIN) 0.5 MG tablet; Take 1 tablet (0.5 mg total) by mouth 2 (two) times daily as needed for anxiety.  Dispense: 60 tablet; Refill: 1  6. Depression Stress management - DULoxetine (CYMBALTA) 30 MG capsule; Take 1 capsule (30 mg total) by mouth daily.  Dispense: 90 capsule; Refill: 1  7. Allergic rhinitis Avoid allergens Continue flonase and claritin as needed  8. Encounter for routine gynecological examination - Pap IG w/ reflex to HPV when ASC-U  9. COPD exacerbation Stop SMOKING!!!!! - budesonide-formoterol (SYMBICORT) 160-4.5 MCG/ACT inhaler; Inhale 2 puffs into the lungs 2 (two) times daily.  Dispense: 3 Inhaler; Refill: 1  10. Constipation amitiza 1 po BID samples Force fkuids Increase fiber in diet  Health maintenance reviewed [facility] cards given to patient  Mary-Margaret Daphine Deutscher, FNP

## 2012-11-15 NOTE — Patient Instructions (Addendum)
Health Maintenance, Females A healthy lifestyle and preventative care can promote health and wellness.  Maintain regular health, dental, and eye exams.  Eat a healthy diet. Foods like vegetables, fruits, whole grains, low-fat dairy products, and lean protein foods contain the nutrients you need without too many calories. Decrease your intake of foods high in solid fats, added sugars, and salt. Get information about a proper diet from your caregiver, if necessary.  Regular physical exercise is one of the most important things you can do for your health. Most adults should get at least 150 minutes of moderate-intensity exercise (any activity that increases your heart rate and causes you to sweat) each week. In addition, most adults need muscle-strengthening exercises on 2 or more days a week.   Maintain a healthy weight. The body mass index (BMI) is a screening tool to identify possible weight problems. It provides an estimate of body fat based on height and weight. Your caregiver can help determine your BMI, and can help you achieve or maintain a healthy weight. For adults 20 years and older:  A BMI below 18.5 is considered underweight.  A BMI of 18.5 to 24.9 is normal.  A BMI of 25 to 29.9 is considered overweight.  A BMI of 30 and above is considered obese.  Maintain normal blood lipids and cholesterol by exercising and minimizing your intake of saturated fat. Eat a balanced diet with plenty of fruits and vegetables. Blood tests for lipids and cholesterol should begin at age 20 and be repeated every 5 years. If your lipid or cholesterol levels are high, you are over 50, or you are a high risk for heart disease, you may need your cholesterol levels checked more frequently.Ongoing high lipid and cholesterol levels should be treated with medicines if diet and exercise are not effective.  If you smoke, find out from your caregiver how to quit. If you do not use tobacco, do not start.  If you  are pregnant, do not drink alcohol. If you are breastfeeding, be very cautious about drinking alcohol. If you are not pregnant and choose to drink alcohol, do not exceed 1 drink per day. One drink is considered to be 12 ounces (355 mL) of beer, 5 ounces (148 mL) of wine, or 1.5 ounces (44 mL) of liquor.  Avoid use of street drugs. Do not share needles with anyone. Ask for help if you need support or instructions about stopping the use of drugs.  High blood pressure causes heart disease and increases the risk of stroke. Blood pressure should be checked at least every 1 to 2 years. Ongoing high blood pressure should be treated with medicines, if weight loss and exercise are not effective.  If you are 55 to 59 years old, ask your caregiver if you should take aspirin to prevent strokes.  Diabetes screening involves taking a blood sample to check your fasting blood sugar level. This should be done once every 3 years, after age 45, if you are within normal weight and without risk factors for diabetes. Testing should be considered at a younger age or be carried out more frequently if you are overweight and have at least 1 risk factor for diabetes.  Breast cancer screening is essential preventative care for women. You should practice "breast self-awareness." This means understanding the normal appearance and feel of your breasts and may include breast self-examination. Any changes detected, no matter how small, should be reported to a caregiver. Women in their 20s and 30s should have   a clinical breast exam (CBE) by a caregiver as part of a regular health exam every 1 to 3 years. After age 40, women should have a CBE every year. Starting at age 40, women should consider having a mammogram (breast X-ray) every year. Women who have a family history of breast cancer should talk to their caregiver about genetic screening. Women at a high risk of breast cancer should talk to their caregiver about having an MRI and a  mammogram every year.  The Pap test is a screening test for cervical cancer. Women should have a Pap test starting at age 21. Between ages 21 and 29, Pap tests should be repeated every 2 years. Beginning at age 30, you should have a Pap test every 3 years as long as the past 3 Pap tests have been normal. If you had a hysterectomy for a problem that was not cancer or a condition that could lead to cancer, then you no longer need Pap tests. If you are between ages 65 and 70, and you have had normal Pap tests going back 10 years, you no longer need Pap tests. If you have had past treatment for cervical cancer or a condition that could lead to cancer, you need Pap tests and screening for cancer for at least 20 years after your treatment. If Pap tests have been discontinued, risk factors (such as a new sexual partner) need to be reassessed to determine if screening should be resumed. Some women have medical problems that increase the chance of getting cervical cancer. In these cases, your caregiver may recommend more frequent screening and Pap tests.  The human papillomavirus (HPV) test is an additional test that may be used for cervical cancer screening. The HPV test looks for the virus that can cause the cell changes on the cervix. The cells collected during the Pap test can be tested for HPV. The HPV test could be used to screen women aged 30 years and older, and should be used in women of any age who have unclear Pap test results. After the age of 30, women should have HPV testing at the same frequency as a Pap test.  Colorectal cancer can be detected and often prevented. Most routine colorectal cancer screening begins at the age of 50 and continues through age 75. However, your caregiver may recommend screening at an earlier age if you have risk factors for colon cancer. On a yearly basis, your caregiver may provide home test kits to check for hidden blood in the stool. Use of a small camera at the end of a  tube, to directly examine the colon (sigmoidoscopy or colonoscopy), can detect the earliest forms of colorectal cancer. Talk to your caregiver about this at age 50, when routine screening begins. Direct examination of the colon should be repeated every 5 to 10 years through age 75, unless early forms of pre-cancerous polyps or small growths are found.  Hepatitis C blood testing is recommended for all people born from 1945 through 1965 and any individual with known risks for hepatitis C.  Practice safe sex. Use condoms and avoid high-risk sexual practices to reduce the spread of sexually transmitted infections (STIs). Sexually active women aged 25 and younger should be checked for Chlamydia, which is a common sexually transmitted infection. Older women with new or multiple partners should also be tested for Chlamydia. Testing for other STIs is recommended if you are sexually active and at increased risk.  Osteoporosis is a disease in which the   bones lose minerals and strength with aging. This can result in serious bone fractures. The risk of osteoporosis can be identified using a bone density scan. Women ages 41 and over and women at risk for fractures or osteoporosis should discuss screening with their caregivers. Ask your caregiver whether you should be taking a calcium supplement or vitamin D to reduce the rate of osteoporosis.  Menopause can be associated with physical symptoms and risks. Hormone replacement therapy is available to decrease symptoms and risks. You should talk to your caregiver about whether hormone replacement therapy is right for you.  Use sunscreen with a sun protection factor (SPF) of 30 or greater. Apply sunscreen liberally and repeatedly throughout the day. You should seek shade when your shadow is shorter than you. Protect yourself by wearing long sleeves, pants, a wide-brimmed hat, and sunglasses year round, whenever you are outdoors.  Notify your caregiver of new moles or  changes in moles, especially if there is a change in shape or color. Also notify your caregiver if a mole is larger than the size of a pencil eraser.  Stay current with your immunizations. Document Released: 09/02/2010 Document Revised: 05/12/2011 Document Reviewed: 09/02/2010 [facility] Patient Information 2014 [place], [place]. Constipation, Adult Constipation is when a person has fewer than 3 bowel movements a week; has difficulty having a bowel movement; or has stools that are dry, hard, or larger than normal. As people grow older, constipation is more common. If you try to fix constipation with medicines that make you have a bowel movement (laxatives), the problem may get worse. Long-term laxative use may cause the muscles of the colon to become weak. A low-fiber diet, not taking in enough fluids, and taking certain medicines may make constipation worse. CAUSES   Certain medicines, such as antidepressants, pain medicine, iron supplements, antacids, and water pills.   Certain diseases, such as diabetes, irritable bowel syndrome (IBS), thyroid disease, or depression.   Not drinking enough water.   Not eating enough fiber-rich foods.   Stress or travel.  Lack of physical activity or exercise.  Not going to the restroom when there is the urge to have a bowel movement.  Ignoring the urge to have a bowel movement.  Using laxatives too much. SYMPTOMS   Having fewer than 3 bowel movements a week.   Straining to have a bowel movement.   Having hard, dry, or larger than normal stools.   Feeling full or bloated.   Pain in the lower abdomen.  Not feeling relief after having a bowel movement. DIAGNOSIS  Your caregiver will take a medical history and perform a physical exam. Further testing may be done for severe constipation. Some tests may include:   A barium enema X-ray to examine your rectum, colon, and sometimes, your small intestine.  A sigmoidoscopy to examine your  lower colon.  A colonoscopy to examine your entire colon. TREATMENT  Treatment will depend on the severity of your constipation and what is causing it. Some dietary treatments include drinking more fluids and eating more fiber-rich foods. Lifestyle treatments may include regular exercise. If these diet and lifestyle recommendations do not help, your caregiver may recommend taking over-the-counter laxative medicines to help you have bowel movements. Prescription medicines may be prescribed if over-the-counter medicines do not work.  HOME CARE INSTRUCTIONS   Increase dietary fiber in your diet, such as fruits, vegetables, whole grains, and beans. Limit high-fat and processed sugars in your diet, such as [place] fries, hamburgers, cookies, candies, and soda.  A fiber supplement may be added to your diet if you cannot get enough fiber from foods.   Drink enough fluids to keep your urine clear or pale yellow.   Exercise regularly or as directed by your caregiver.   Go to the restroom when you have the urge to go. Do not hold it.  Only take medicines as directed by your caregiver. Do not take other medicines for constipation without talking to your caregiver first. SEEK IMMEDIATE MEDICAL CARE IF:   You have bright red blood in your stool.   Your constipation lasts for more than 4 days or gets worse.   You have abdominal or rectal pain.   You have thin, pencil-like stools.  You have unexplained weight loss. MAKE SURE YOU:   Understand these instructions.  Will watch your condition.  Will get help right away if you are not doing well or get worse. Document Released: 11/16/2003 Document Revised: 05/12/2011 Document Reviewed: 01/21/2011 [facility] Patient Information 2014 [place], [place].

## 2012-11-16 LAB — NMR, LIPOPROFILE
Cholesterol: 161 mg/dL (ref <200)
HDL Particle Number: 38.3 umol/L (ref >=30.5)
LDL Size: 19.9 nm — ABNORMAL LOW (ref >20.5)
LP-IR Score: 53 — ABNORMAL HIGH (ref <=45)
Small LDL Particle Number: 1176 nmol/L — ABNORMAL HIGH (ref <=527)
Triglycerides by NMR: 149 mg/dL (ref <150)

## 2012-11-16 LAB — CMP14+EGFR
ALT: 10 IU/L (ref 0–32)
AST: 12 IU/L (ref 0–40)
Albumin/Globulin Ratio: 2.5 (ref 1.1–2.5)
Albumin: 4.5 g/dL (ref 3.5–5.5)
Alkaline Phosphatase: 97 IU/L (ref 39–117)
BUN/Creatinine Ratio: 29 — ABNORMAL HIGH (ref 9–23)
BUN: 20 mg/dL (ref 6–24)
CO2: 26 mmol/L (ref 18–29)
Calcium: 9.6 mg/dL (ref 8.7–10.2)
Chloride: 100 mmol/L (ref 97–108)
Creatinine, Ser: 0.7 mg/dL (ref 0.57–1.00)
GFR calc Af Amer: 110 mL/min/1.73
GFR calc non Af Amer: 95 mL/min/1.73
Globulin, Total: 1.8 g/dL (ref 1.5–4.5)
Glucose: 85 mg/dL (ref 65–99)
Potassium: 4.2 mmol/L (ref 3.5–5.2)
Sodium: 141 mmol/L (ref 134–144)
Total Bilirubin: 0.4 mg/dL (ref 0.0–1.2)
Total Protein: 6.3 g/dL (ref 6.0–8.5)

## 2012-11-16 LAB — THYROID PANEL WITH TSH
Free Thyroxine Index: 1.8 (ref 1.2–4.9)
T3 Uptake Ratio: 27 % (ref 24–39)
T4, Total: 6.7 ug/dL (ref 4.5–12.0)
TSH: 0.734 u[IU]/mL (ref 0.450–4.500)

## 2012-11-19 LAB — GYN REPORT

## 2012-11-26 ENCOUNTER — Telehealth: Payer: Self-pay | Admitting: Nurse Practitioner

## 2012-11-29 LAB — PAP IG W/ RFLX HPV ASCU

## 2012-12-03 NOTE — Telephone Encounter (Signed)
Attempts made to reach patient by phone were unsuccessful. 

## 2012-12-08 ENCOUNTER — Encounter (INDEPENDENT_AMBULATORY_CARE_PROVIDER_SITE_OTHER): Payer: Self-pay

## 2012-12-08 ENCOUNTER — Other Ambulatory Visit: Payer: Medicaid Other

## 2012-12-08 ENCOUNTER — Other Ambulatory Visit: Payer: Self-pay | Admitting: Family Medicine

## 2012-12-08 ENCOUNTER — Ambulatory Visit (INDEPENDENT_AMBULATORY_CARE_PROVIDER_SITE_OTHER): Payer: Medicaid Other

## 2012-12-08 DIAGNOSIS — M25519 Pain in unspecified shoulder: Secondary | ICD-10-CM

## 2012-12-08 DIAGNOSIS — M25511 Pain in right shoulder: Secondary | ICD-10-CM

## 2013-01-11 ENCOUNTER — Other Ambulatory Visit: Payer: Self-pay | Admitting: Nurse Practitioner

## 2013-01-12 NOTE — Telephone Encounter (Signed)
Last seen and filled with 1 Rf on 11/15/12. Uses [facility] [phone]

## 2013-01-12 NOTE — Telephone Encounter (Signed)
Please call in klonopin

## 2013-01-13 NOTE — Telephone Encounter (Signed)
RX called to [facility], [place].

## 2013-02-09 ENCOUNTER — Telehealth: Payer: Self-pay | Admitting: Nurse Practitioner

## 2013-02-09 NOTE — Telephone Encounter (Signed)
appt scheduled

## 2013-02-11 ENCOUNTER — Ambulatory Visit (INDEPENDENT_AMBULATORY_CARE_PROVIDER_SITE_OTHER): Payer: Medicaid Other | Admitting: Nurse Practitioner

## 2013-02-11 ENCOUNTER — Encounter: Payer: Self-pay | Admitting: Nurse Practitioner

## 2013-02-11 VITALS — BP 111/75 | HR 110 | Temp 98.4°F | Ht 66.0 in | Wt 142.0 lb

## 2013-02-11 DIAGNOSIS — Z23 Encounter for immunization: Secondary | ICD-10-CM

## 2013-02-11 DIAGNOSIS — Z202 Contact with and (suspected) exposure to infections with a predominantly sexual mode of transmission: Secondary | ICD-10-CM

## 2013-02-11 DIAGNOSIS — F411 Generalized anxiety disorder: Secondary | ICD-10-CM

## 2013-02-11 DIAGNOSIS — Z9189 Other specified personal risk factors, not elsewhere classified: Secondary | ICD-10-CM

## 2013-02-11 MED ORDER — CITALOPRAM HYDROBROMIDE 20 MG PO TABS: 20.0000 mg | ORAL_TABLET | Freq: Every day | ORAL | Status: DC

## 2013-02-11 NOTE — Progress Notes (Signed)
   Subjective:    Patient ID: [name], female    DOB: 1955/05/01, 57 y.o.   MRN: [id number]  HPI Patient in today saying that her husband has been cheating on her and she wants to be checked for STD. SHe says that he has been seeing her son's girlfriend. Patient denies any discharge or pelvic pain.   Review of Systems  All other systems reviewed and are negative.       Objective:   Physical Exam  Constitutional: She appears well-developed and well-nourished.  Cardiovascular: Normal rate, regular rhythm and normal heart sounds.   Pulmonary/Chest: Effort normal and breath sounds normal.  Abdominal: Soft. Bowel sounds are normal. She exhibits no distension and no mass. There is no tenderness. There is no rebound and no guarding.  Genitourinary:  No pelvic exam done today.  Skin: Skin is warm.  Multiple bruising bil forearms  Psychiatric:  Tearful throughout exam    BP 111/75  Pulse 110  Temp(Src) 98.4 F (36.9 C) (Oral)  Ht 5\' 6"  (1.676 m)  Wt 142 lb (64.411 kg)  BMI 22.93 kg/m2       Assessment & Plan:   1. Possible exposure to STD   2. GAD (generalized anxiety disorder)    Meds ordered this encounter  Medications  . citalopram (CELEXA) 20 MG tablet    Sig: Take 1 tablet (20 mg total) by mouth daily.    Dispense:  30 tablet    Refill:  3    Order Specific Question:  Supervising Provider    Answer:  Ernestina Penna [[id number]]   Orders Placed This Encounter  Procedures  . GC/Chlamydia Probe Amp  . STD Panel (HBSAG,HIV,RPR)    Stress management discussed Labs pending List of counselors given to patient Follow up prn  Mary-Margaret Daphine Deutscher, FNP

## 2013-02-11 NOTE — Patient Instructions (Signed)
[facility]- Therapist [address] [place] ,[place] [address] [phone] Children limited to anxiety and depression- NO ADD/ADHD Does not accept Medicaid  [facility] [address] [place], [place] [phone] Does see children Does accept medicaid Will assess for Autism but not treat  [facility] [address] [place] [phone] Does see children  Does accept Medicaid Medication management- substance abuse- bipolar- grief- family-marriage- OCD- Anxiety- PTSD  [facility] [address] [place]  [phone] Does see children Does accept medicaid They do perform psychological testing  [facility] [address] [place] Schedule through [employer] [phone] Patient must call and make own appointment Does se children Does accept Medicaid  [facility] [address] , [place] [phone] Sees Children 7-10 accompanied by an adult, 11 and up by themselves Does accept Medicaid Will see patients with- substance abuse-ADHD-ADD-Bipolar-Domestic violence-Marriage counseling- Family Counseling and sexual abuse  [facility]- Psychologist and Psychiatrist [address] [place] [phone] Does see children Does accept Medicaid  [facility] [address] [place] [phone]  Dr. Lugo-  Psychiatrist [address] [place], [place] [phone] Specializes in ADHD and addictions They do ADHD testing Suboxone clinic  [facility] [address] [place] [phone] Does Child psychological testing  [facility] [address] [place] [phone] Does Accept Medicaid Evaluates for Autism  [facility] [address] [place] [phone] Does Not accept Medicaid Does do adult ADD  evaluations  Dr. Akinlayo [address] [place] [phone] Does not Take Medicaid Sees ADD and ADHD for treatment      [facility] [address] ,  [address] [phone] Takes Medicaid WIll see children as young as 3         

## 2013-02-12 LAB — STD PANEL
HIV-1/HIV-2 Ab: NONREACTIVE
Hepatitis B Surface Ag: NEGATIVE

## 2013-02-15 LAB — GC/CHLAMYDIA PROBE AMP: Neisseria gonorrhoeae by PCR: NEGATIVE

## 2013-02-21 ENCOUNTER — Telehealth: Payer: Self-pay | Admitting: Nurse Practitioner

## 2013-02-21 NOTE — Telephone Encounter (Signed)
Message copied by Azalee Course on Mon Feb 21, 2013 11:55 AM ------      Message from: Bennie Pierini      Created: Tue Feb 15, 2013 10:10 AM       Everything negative ------

## 2013-02-21 NOTE — Telephone Encounter (Signed)
Cant leave a message attempted to call today with labs

## 2013-03-04 ENCOUNTER — Telehealth: Payer: Self-pay | Admitting: Nurse Practitioner

## 2013-03-15 NOTE — Telephone Encounter (Signed)
Aware. 

## 2013-03-17 ENCOUNTER — Other Ambulatory Visit: Payer: Self-pay | Admitting: Nurse Practitioner

## 2013-03-21 NOTE — Telephone Encounter (Signed)
rx called in

## 2013-03-21 NOTE — Telephone Encounter (Signed)
Please call in klonpin rx with 1 refill

## 2013-03-23 ENCOUNTER — Encounter: Payer: Self-pay | Admitting: *Deleted

## 2013-03-23 NOTE — Telephone Encounter (Signed)
This encounter was created in error - please disregard.

## 2013-04-01 ENCOUNTER — Encounter: Payer: Self-pay | Admitting: Nurse Practitioner

## 2013-04-01 ENCOUNTER — Ambulatory Visit (INDEPENDENT_AMBULATORY_CARE_PROVIDER_SITE_OTHER): Payer: Medicaid Other | Admitting: Nurse Practitioner

## 2013-04-01 ENCOUNTER — Telehealth: Payer: Self-pay | Admitting: Nurse Practitioner

## 2013-04-01 VITALS — BP 113/65 | HR 90 | Temp 98.5°F | Ht 66.0 in | Wt 145.0 lb

## 2013-04-01 DIAGNOSIS — K1379 Other lesions of oral mucosa: Secondary | ICD-10-CM

## 2013-04-01 DIAGNOSIS — J029 Acute pharyngitis, unspecified: Secondary | ICD-10-CM

## 2013-04-01 DIAGNOSIS — K1370 Unspecified lesions of oral mucosa: Secondary | ICD-10-CM

## 2013-04-01 LAB — POCT RAPID STREP A (OFFICE): Rapid Strep A Screen: NEGATIVE

## 2013-04-01 MED ORDER — METHYLPREDNISOLONE ACETATE 80 MG/ML IJ SUSP
80.0000 mg | Freq: Once | INTRAMUSCULAR | Status: AC
  Administered 2013-04-01: 80 mg via INTRAMUSCULAR

## 2013-04-01 NOTE — Progress Notes (Signed)
   Subjective:    Patient ID: KIKUE GERHART, female    DOB: 06-Jul-1955, 58 y.o.   MRN: [id number]  HPI Patient in today c/o sore throat- started this morning when she woke up.    Review of Systems  Constitutional: Positive for fever (?). Negative for chills and appetite change.  HENT: Positive for congestion, sore throat and trouble swallowing.   Respiratory: Negative for cough.   Cardiovascular: Negative.   Genitourinary: Negative.   All other systems reviewed and are negative.       Objective:   Physical Exam  Constitutional: She is oriented to person, place, and time. She appears well-developed and well-nourished.  HENT:  Right Ear: Hearing, tympanic membrane, external ear and ear canal normal.  Left Ear: Hearing, tympanic membrane, external ear and ear canal normal.  Nose: Mucosal edema present.  Mouth/Throat: Mucous membranes are normal. Uvula swelling present. Posterior oropharyngeal erythema present.  Eyes: Pupils are equal, round, and reactive to light.  Neck: Normal range of motion.  Cardiovascular: Normal rate, regular rhythm and normal heart sounds.   Pulmonary/Chest: Effort normal and breath sounds normal.  Lymphadenopathy:    She has no cervical adenopathy.  Neurological: She is alert and oriented to person, place, and time.  Skin: Skin is warm and dry.  Psychiatric: She has a normal mood and affect. Her behavior is normal. Judgment and thought content normal.   BP 113/65  Pulse 90  Temp(Src) 98.5 F (36.9 C) (Oral)  Ht 5\' 6"  (1.676 m)  Wt 145 lb (65.772 kg)  BMI 23.41 kg/m2 Results for orders placed in visit on 04/01/13  POCT RAPID STREP A (OFFICE)      Result Value Range   Rapid Strep A Screen Negative  Negative          Assessment & Plan:   1. Sore throat   2. Uvular swelling    Orders Placed This Encounter  Procedures  . POCT rapid strep A   Meds ordered this encounter  Medications  . methylPREDNISolone acetate (DEPO-MEDROL) injection  80 mg    Sig:     Force fluids Throat lozgenses if help Follow up prn  Mary-Margaret Hassell Done, FNP

## 2013-04-01 NOTE — Telephone Encounter (Signed)
Looks like you saw this patient

## 2013-04-01 NOTE — Patient Instructions (Signed)
Sore Throat A sore throat is pain, burning, irritation, or scratchiness of the throat. There is often pain or tenderness when swallowing or talking. A sore throat may be accompanied by other symptoms, such as coughing, sneezing, fever, and swollen neck glands. A sore throat is often the first sign of another sickness, such as a cold, flu, strep throat, or mononucleosis (commonly known as mono). Most sore throats go away without medical treatment. CAUSES  The most common causes of a sore throat include:  A viral infection, such as a cold, flu, or mono.  A bacterial infection, such as strep throat, tonsillitis, or whooping cough.  Seasonal allergies.  Dryness in the air.  Irritants, such as smoke or pollution.  Gastroesophageal reflux disease (GERD). HOME CARE INSTRUCTIONS   Only take over-the-counter medicines as directed by your caregiver.  Drink enough fluids to keep your urine clear or pale yellow.  Rest as needed.  Try using throat sprays, lozenges, or sucking on hard candy to ease any pain (if older than 4 years or as directed).  Sip warm liquids, such as broth, herbal tea, or warm water with honey to relieve pain temporarily. You may also eat or drink cold or frozen liquids such as frozen ice pops.  Gargle with salt water (mix 1 tsp salt with 8 oz of water).  Do not smoke and avoid secondhand smoke.  Put a cool-mist humidifier in your bedroom at night to moisten the air. You can also turn on a hot shower and sit in the bathroom with the door closed for 5 10 minutes. SEEK IMMEDIATE MEDICAL CARE IF:  You have difficulty breathing.  You are unable to swallow fluids, soft foods, or your saliva.  You have increased swelling in the throat.  Your sore throat does not get better in 7 days.  You have nausea and vomiting.  You have a fever or persistent symptoms for more than 2 3 days.  You have a fever and your symptoms suddenly get worse. MAKE SURE YOU:   Understand  these instructions.  Will watch your condition.  Will get help right away if you are not doing well or get worse. Document Released: 03/27/2004 Document Revised: 02/04/2012 Document Reviewed: 10/26/2011 [employer] Patient Information 2014 [employer].  

## 2013-04-02 MED ORDER — AZITHROMYCIN 250 MG PO TABS: ORAL_TABLET | ORAL | Status: DC

## 2013-04-02 NOTE — Telephone Encounter (Signed)
rx sent to pharmacy

## 2013-04-04 NOTE — Telephone Encounter (Signed)
Patient aware rx sent to pharmacy.  

## 2013-04-05 ENCOUNTER — Telehealth: Payer: Self-pay | Admitting: Nurse Practitioner

## 2013-04-05 DIAGNOSIS — F32A Depression, unspecified: Secondary | ICD-10-CM

## 2013-04-05 DIAGNOSIS — F329 Major depressive disorder, single episode, unspecified: Secondary | ICD-10-CM

## 2013-04-05 MED ORDER — DULOXETINE HCL 30 MG PO CPEP: 30.0000 mg | ORAL_CAPSULE | Freq: Every day | ORAL | Status: DC

## 2013-04-05 NOTE — Telephone Encounter (Signed)
done

## 2013-04-18 ENCOUNTER — Telehealth: Payer: Self-pay | Admitting: Nurse Practitioner

## 2013-04-18 NOTE — Telephone Encounter (Signed)
Clonazepam was called in 03/17/13 with 1 refill- should have refill at pharmacy

## 2013-04-21 ENCOUNTER — Telehealth: Payer: Self-pay | Admitting: Nurse Practitioner

## 2013-04-22 NOTE — Telephone Encounter (Signed)
Called patient and she said she use to get Rx at [facility] in [place] so I called [facility] and they said her clonazepam has been there for over a week. So patient was told to call [place] [facility] to get them to request it from [facility] and patient is aware.

## 2013-05-16 ENCOUNTER — Ambulatory Visit (INDEPENDENT_AMBULATORY_CARE_PROVIDER_SITE_OTHER): Payer: Medicaid Other

## 2013-05-16 ENCOUNTER — Ambulatory Visit (INDEPENDENT_AMBULATORY_CARE_PROVIDER_SITE_OTHER): Payer: Medicaid Other | Admitting: Family Medicine

## 2013-05-16 ENCOUNTER — Other Ambulatory Visit: Payer: Self-pay | Admitting: Physician Assistant

## 2013-05-16 ENCOUNTER — Other Ambulatory Visit: Payer: Medicaid Other

## 2013-05-16 VITALS — BP 132/80 | HR 88 | Temp 98.2°F | Ht 66.0 in | Wt 150.4 lb

## 2013-05-16 DIAGNOSIS — R52 Pain, unspecified: Secondary | ICD-10-CM

## 2013-05-16 DIAGNOSIS — T148XXA Other injury of unspecified body region, initial encounter: Secondary | ICD-10-CM

## 2013-05-16 DIAGNOSIS — Z720 Tobacco use: Secondary | ICD-10-CM

## 2013-05-16 DIAGNOSIS — R0602 Shortness of breath: Secondary | ICD-10-CM

## 2013-05-16 DIAGNOSIS — M25559 Pain in unspecified hip: Secondary | ICD-10-CM

## 2013-05-16 DIAGNOSIS — J441 Chronic obstructive pulmonary disease with (acute) exacerbation: Secondary | ICD-10-CM

## 2013-05-16 DIAGNOSIS — F17200 Nicotine dependence, unspecified, uncomplicated: Secondary | ICD-10-CM

## 2013-05-16 MED ORDER — PREDNISONE 10 MG PO TABS: ORAL_TABLET | ORAL | Status: DC

## 2013-05-16 MED ORDER — AMOXICILLIN 875 MG PO TABS: 875.0000 mg | ORAL_TABLET | Freq: Two times a day (BID) | ORAL | Status: DC

## 2013-05-16 MED ORDER — BENZONATATE 100 MG PO CAPS: 100.0000 mg | ORAL_CAPSULE | Freq: Three times a day (TID) | ORAL | Status: DC | PRN

## 2013-05-16 MED ORDER — ALBUTEROL SULFATE HFA 108 (90 BASE) MCG/ACT IN AERS: 2.0000 | INHALATION_SPRAY | Freq: Four times a day (QID) | RESPIRATORY_TRACT | Status: DC | PRN

## 2013-05-16 MED ORDER — NICOTINE 14 MG/24HR TD PT24: 14.0000 mg | MEDICATED_PATCH | Freq: Every day | TRANSDERMAL | Status: DC

## 2013-05-16 MED ORDER — BUDESONIDE-FORMOTEROL FUMARATE 160-4.5 MCG/ACT IN AERO: 2.0000 | INHALATION_SPRAY | Freq: Two times a day (BID) | RESPIRATORY_TRACT | Status: DC

## 2013-05-16 MED ORDER — NICOTINE 21 MG/24HR TD PT24: 21.0000 mg | MEDICATED_PATCH | Freq: Every day | TRANSDERMAL | Status: DC

## 2013-05-16 MED ORDER — NICOTINE 7 MG/24HR TD PT24: 7.0000 mg | MEDICATED_PATCH | Freq: Every day | TRANSDERMAL | Status: DC

## 2013-05-16 NOTE — Progress Notes (Signed)
   Subjective:    Patient ID: [name], female    DOB: [birth year], 58 y.o.   MRN: [id number]  HPI This 58 y.o. female presents for evaluation of c/o bruising on her arms and copd exacerbation. She would like to quit smoking.  She is having a lot of SOB and she states she has had 4 exacerbations of COPD.  She had bronchitis in January and in February she went to the ED And was put on  Steroid taper which helped.  She needs refills on her symbicort.   Review of Systems C/o cough and uri sx's No chest pain, SOB, HA, dizziness, vision change, N/V, diarrhea, constipation, dysuria, urinary urgency or frequency, myalgias, arthralgias or rash.     Objective:   Physical Exam Vital signs noted  Well developed well nourished female.  HEENT - Head atraumatic Normocephalic                Eyes - PERRLA, Conjuctiva - clear Sclera- Clear EOMI                Ears - EAC's Wnl TM's Wnl Gross Hearing WNL                Throat - oropharanx wnl Respiratory - Lungs Distant with exp wheezes Cardiac - RRR S1 and S2 without murmur GI - Abdomen soft Nontender and bowel sounds active x 4 Extremities - No edema. Neuro - Grossly intact.       Assessment & Plan:  COPD exacerbation - Plan: predniSONE (DELTASONE) 10 MG tablet, benzonatate (TESSALON PERLES) 100 MG capsule, budesonide-formoterol (SYMBICORT) 160-4.5 MCG/ACT inhaler, albuterol (PROVENTIL HFA;VENTOLIN HFA) 108 (90 BASE) MCG/ACT inhaler  Tobacco abuse - Plan: nicotine (NICODERM CQ - DOSED IN MG/24 HOURS) 21 mg/24hr patch, nicotine (NICODERM CQ - DOSED IN MG/24 HOURS) 14 mg/24hr patch, nicotine (NICODERM CQ - DOSED IN MG/24 HR) 7 mg/24hr patch  SOB (shortness of breath) - Plan: Ambulatory referral to Pulmonology  Bruising - Plan: POCT CBC  Follow up prn  Lysbeth Penner FNP

## 2013-05-16 NOTE — Patient Instructions (Signed)
Chronic Obstructive Pulmonary Disease  Chronic obstructive pulmonary disease (COPD) is a common lung condition in which airflow from the lungs is limited. COPD is a general term that can be used to describe many different lung problems that limit airflow, including both chronic bronchitis and emphysema.  If you have COPD, your lung function will probably never return to normal, but there are measures you can take to improve lung function and make yourself feel better.   CAUSES   · Smoking (common).    · Exposure to secondhand smoke.    · Genetic problems.  · Chronic inflammatory lung diseases or recurrent infections.  SYMPTOMS   · Shortness of breath, especially with physical activity.    · Deep, persistent (chronic) cough with a large amount of thick mucus.    · Wheezing.    · Rapid breaths (tachypnea).    · Gray or bluish discoloration (cyanosis) of the skin, especially in fingers, toes, or lips.    · Fatigue.    · Weight loss.    · Frequent infections or episodes when breathing symptoms become much worse (exacerbations).    · Chest tightness.  DIAGNOSIS   Your healthcare provider will take a medical history and perform a physical examination to make the initial diagnosis.  Additional tests for COPD may include:   · Lung (pulmonary) function tests.  · Chest X-ray.  · CT scan.  · Blood tests.  TREATMENT   Treatment available to help you feel better when you have COPD include:   · Inhaler and nebulizer medicines. These help manage the symptoms of COPD and make your breathing more comfortable  · Supplemental oxygen. Supplemental oxygen is only helpful if you have a low oxygen level in your blood.    · Exercise and physical activity. These are beneficial for nearly all people with COPD. Some people may also benefit from a pulmonary rehabilitation program.  HOME CARE INSTRUCTIONS   · Take all medicines (inhaled or pills) as directed by your health care provider.  · Only take over-the-counter or prescription medicines  for pain, fever, or discomfort as directed by your health care provider.    · Avoid over-the-counter medicines or cough syrups that dry up your airway (such as antihistamines) and slow down the elimination of secretions unless instructed otherwise by your healthcare provider.    · If you are a smoker, the most important thing that you can do is stop smoking. Continuing to smoke will cause further lung damage and breathing trouble. Ask your health care provider for help with quitting smoking. He or she can direct you to community resources or hospitals that provide support.  · Avoid exposure to irritants such as smoke, chemicals, and fumes that aggravate your breathing.  · Use oxygen therapy and pulmonary rehabilitation if directed by your health care provider. If you require home oxygen therapy, ask your healthcare provider whether you should purchase a pulse oximeter to measure your oxygen level at home.    · Avoid contact with individuals who have a contagious illness.  · Avoid extreme temperature and humidity changes.  · Eat healthy foods. Eating smaller, more frequent meals and resting before meals may help you maintain your strength.  · Stay active, but balance activity with periods of rest. Exercise and physical activity will help you maintain your ability to do things you want to do.  · Preventing infection and hospitalization is very important when you have COPD. Make sure to receive all the vaccines your health care provider recommends, especially the pneumococcal and influenza vaccines. Ask your healthcare provider whether you   need a pneumonia vaccine.  · Learn and use relaxation techniques to manage stress.  · Learn and use controlled breathing techniques as directed by your health care provider. Controlled breathing techniques include:    · Pursed lip breathing. Start by breathing in (inhaling) through your nose for 1 second. Then, purse your lips as if you were going to whistle and breathe out (exhale)  through the pursed lips for 2 seconds.    · Diaphragmatic breathing. Start by putting one hand on your abdomen just above your waist. Inhale slowly through your nose. The hand on your abdomen should move out. Then purse your lips and exhale slowly. You should be able to feel the hand on your abdomen moving in as you exhale.    · Learn and use controlled coughing to clear mucus from your lungs. Controlled coughing is a series of short, progressive coughs. The steps of controlled coughing are:    1. Lean your head slightly forward.    2. Breathe in deeply using diaphragmatic breathing.    3. Try to hold your breath for 3 seconds.    4. Keep your mouth slightly open while coughing twice.    5. Spit any mucus out into a tissue.    6. Rest and repeat the steps once or twice as needed.  SEEK MEDICAL CARE IF:   · You are coughing up more mucus than usual.    · There is a change in the color or thickness of your mucus.    · Your breathing is more labored than usual.    · Your breathing is faster than usual.    SEEK IMMEDIATE MEDICAL CARE IF:   · You have shortness of breath while you are resting.    · You have shortness of breath that prevents you from:  · Being able to talk.    · Performing your usual physical activities.    · You have chest pain lasting longer than 5 minutes.    · Your skin color is more cyanotic than usual.  · You measure low oxygen saturations for longer than 5 minutes with a pulse oximeter.  MAKE SURE YOU:   · Understand these instructions.  · Will watch your condition.  · Will get help right away if you are not doing well or get worse.  Document Released: 11/27/2004 Document Revised: 12/08/2012 Document Reviewed: 10/14/2012  [employer]® Patient Information ©2014 [employer].

## 2013-05-18 ENCOUNTER — Other Ambulatory Visit: Payer: Self-pay | Admitting: Nurse Practitioner

## 2013-05-19 NOTE — Telephone Encounter (Signed)
Please call in klonopin with 1 refills 

## 2013-05-20 ENCOUNTER — Other Ambulatory Visit: Payer: Self-pay | Admitting: Nurse Practitioner

## 2013-05-20 NOTE — Telephone Encounter (Signed)
Called in.

## 2013-05-23 NOTE — Telephone Encounter (Signed)
Do not see on med list? 

## 2013-05-25 ENCOUNTER — Other Ambulatory Visit: Payer: Self-pay | Admitting: *Deleted

## 2013-05-25 MED ORDER — PANTOPRAZOLE SODIUM 40 MG PO TBEC: DELAYED_RELEASE_TABLET | ORAL | Status: DC

## 2013-06-21 ENCOUNTER — Other Ambulatory Visit: Payer: Self-pay | Admitting: Nurse Practitioner

## 2013-06-23 NOTE — Telephone Encounter (Signed)
Last seen 05/16/13  B Oxford  Last lipid 11/15/12

## 2013-07-05 ENCOUNTER — Telehealth: Payer: Self-pay | Admitting: Nurse Practitioner

## 2013-07-05 DIAGNOSIS — K635 Polyp of colon: Secondary | ICD-10-CM

## 2013-07-06 NOTE — Telephone Encounter (Signed)
She had a colonoscopy 4.5 years ago with Dr. Watt Climes. Hx of polyps.  She thinks she is due for one soon. Patient can probably schedule herself but I will place a referral.

## 2013-07-12 ENCOUNTER — Observation Stay (HOSPITAL_COMMUNITY)
Admission: EM | Admit: 2013-07-12 | Discharge: 2013-07-13 | DRG: 313 | Disposition: A | Discharge status: Home / Self Care | Payer: Medicaid Other | Attending: Internal Medicine | Admitting: Internal Medicine

## 2013-07-12 ENCOUNTER — Other Ambulatory Visit: Payer: Self-pay

## 2013-07-12 ENCOUNTER — Ambulatory Visit (INDEPENDENT_AMBULATORY_CARE_PROVIDER_SITE_OTHER): Payer: Medicaid Other | Admitting: General Practice

## 2013-07-12 ENCOUNTER — Emergency Department (HOSPITAL_COMMUNITY): Payer: Medicaid Other

## 2013-07-12 ENCOUNTER — Encounter (HOSPITAL_COMMUNITY): Payer: Self-pay | Admitting: Emergency Medicine

## 2013-07-12 VITALS — BP 134/81 | HR 80 | Temp 98.6°F | Ht 66.0 in | Wt 150.0 lb

## 2013-07-12 DIAGNOSIS — J449 Chronic obstructive pulmonary disease, unspecified: Secondary | ICD-10-CM

## 2013-07-12 DIAGNOSIS — M1990 Unspecified osteoarthritis, unspecified site: Secondary | ICD-10-CM | POA: Diagnosis present

## 2013-07-12 DIAGNOSIS — J4489 Other specified chronic obstructive pulmonary disease: Secondary | ICD-10-CM | POA: Diagnosis present

## 2013-07-12 DIAGNOSIS — R079 Chest pain, unspecified: Principal | ICD-10-CM

## 2013-07-12 DIAGNOSIS — I10 Essential (primary) hypertension: Secondary | ICD-10-CM

## 2013-07-12 DIAGNOSIS — J42 Unspecified chronic bronchitis: Secondary | ICD-10-CM

## 2013-07-12 DIAGNOSIS — R51 Headache: Secondary | ICD-10-CM | POA: Diagnosis present

## 2013-07-12 DIAGNOSIS — Z79899 Other long term (current) drug therapy: Secondary | ICD-10-CM

## 2013-07-12 DIAGNOSIS — M129 Arthropathy, unspecified: Secondary | ICD-10-CM

## 2013-07-12 DIAGNOSIS — I2510 Atherosclerotic heart disease of native coronary artery without angina pectoris: Secondary | ICD-10-CM | POA: Diagnosis present

## 2013-07-12 DIAGNOSIS — K219 Gastro-esophageal reflux disease without esophagitis: Secondary | ICD-10-CM

## 2013-07-12 DIAGNOSIS — E785 Hyperlipidemia, unspecified: Secondary | ICD-10-CM

## 2013-07-12 DIAGNOSIS — M79609 Pain in unspecified limb: Secondary | ICD-10-CM

## 2013-07-12 DIAGNOSIS — F3289 Other specified depressive episodes: Secondary | ICD-10-CM | POA: Diagnosis present

## 2013-07-12 DIAGNOSIS — Z8249 Family history of ischemic heart disease and other diseases of the circulatory system: Secondary | ICD-10-CM

## 2013-07-12 DIAGNOSIS — Z7982 Long term (current) use of aspirin: Secondary | ICD-10-CM

## 2013-07-12 DIAGNOSIS — F329 Major depressive disorder, single episode, unspecified: Secondary | ICD-10-CM | POA: Diagnosis present

## 2013-07-12 DIAGNOSIS — R6884 Jaw pain: Secondary | ICD-10-CM

## 2013-07-12 DIAGNOSIS — F17200 Nicotine dependence, unspecified, uncomplicated: Secondary | ICD-10-CM

## 2013-07-12 DIAGNOSIS — R0602 Shortness of breath: Secondary | ICD-10-CM

## 2013-07-12 DIAGNOSIS — R0789 Other chest pain: Secondary | ICD-10-CM

## 2013-07-12 DIAGNOSIS — F341 Dysthymic disorder: Secondary | ICD-10-CM

## 2013-07-12 DIAGNOSIS — E7800 Pure hypercholesterolemia, unspecified: Secondary | ICD-10-CM | POA: Diagnosis present

## 2013-07-12 DIAGNOSIS — G4700 Insomnia, unspecified: Secondary | ICD-10-CM

## 2013-07-12 DIAGNOSIS — F410 Panic disorder [episodic paroxysmal anxiety] without agoraphobia: Secondary | ICD-10-CM | POA: Diagnosis present

## 2013-07-12 LAB — CBC WITH DIFFERENTIAL/PLATELET
BASOS ABS: 0 10*3/uL (ref 0.0–0.1)
Basophils Relative: 0 % (ref 0–1)
EOS PCT: 1 % (ref 0–5)
Eosinophils Absolute: 0.1 10*3/uL (ref 0.0–0.7)
HEMATOCRIT: 41.5 % (ref 36.0–46.0)
HEMOGLOBIN: 13.7 g/dL (ref 12.0–15.0)
LYMPHS PCT: 25 % (ref 12–46)
Lymphs Abs: 2.4 10*3/uL (ref 0.7–4.0)
MCH: 30.9 pg (ref 26.0–34.0)
MCHC: 33 g/dL (ref 30.0–36.0)
MCV: 93.5 fL (ref 78.0–100.0)
MONO ABS: 0.6 10*3/uL (ref 0.1–1.0)
MONOS PCT: 6 % (ref 3–12)
Neutro Abs: 6.5 10*3/uL (ref 1.7–7.7)
Neutrophils Relative %: 68 % (ref 43–77)
Platelets: 262 10*3/uL (ref 150–400)
RBC: 4.44 MIL/uL (ref 3.87–5.11)
RDW: 13.5 % (ref 11.5–15.5)
WBC: 9.6 10*3/uL (ref 4.0–10.5)

## 2013-07-12 LAB — TROPONIN I: Troponin I: <0.3 ng/mL (ref <0.30)

## 2013-07-12 LAB — HEPATIC FUNCTION PANEL
ALBUMIN: 3.4 g/dL — AB (ref 3.5–5.2)
ALK PHOS: 79 U/L (ref 39–117)
ALT: 11 U/L (ref 0–35)
AST: 14 U/L (ref 0–37)
BILIRUBIN TOTAL: 0.2 mg/dL — AB (ref 0.3–1.2)
Bilirubin, Direct: <0.2 mg/dL (ref 0.0–0.3)
TOTAL PROTEIN: 6.4 g/dL (ref 6.0–8.3)

## 2013-07-12 LAB — HEMOGLOBIN A1C
Hgb A1c MFr Bld: 5.9 % — ABNORMAL HIGH (ref <5.7)
Mean Plasma Glucose: 123 mg/dL — ABNORMAL HIGH (ref <117)

## 2013-07-12 LAB — BASIC METABOLIC PANEL
BUN: 19 mg/dL (ref 6–23)
CALCIUM: 9.2 mg/dL (ref 8.4–10.5)
CO2: 30 meq/L (ref 19–32)
CREATININE: 0.74 mg/dL (ref 0.50–1.10)
Chloride: 104 mEq/L (ref 96–112)
GFR calc Af Amer: >90 mL/min (ref >90)
GFR calc non Af Amer: >90 mL/min (ref >90)
GLUCOSE: 99 mg/dL (ref 70–99)
Potassium: 3.7 mEq/L (ref 3.7–5.3)
Sodium: 143 mEq/L (ref 137–147)

## 2013-07-12 LAB — HEPARIN LEVEL (UNFRACTIONATED): Heparin Unfractionated: 0.24 IU/mL — ABNORMAL LOW (ref 0.30–0.70)

## 2013-07-12 LAB — MRSA PCR SCREENING: MRSA BY PCR: NEGATIVE

## 2013-07-12 LAB — PROTIME-INR
INR: 0.9 (ref 0.00–1.49)
Prothrombin Time: 12 seconds (ref 11.6–15.2)

## 2013-07-12 LAB — APTT: APTT: 28 s (ref 24–37)

## 2013-07-12 MED ORDER — ACETAMINOPHEN 325 MG PO TABS
650.0000 mg | ORAL_TABLET | Freq: Four times a day (QID) | ORAL | Status: DC | PRN
  Administered 2013-07-12 – 2013-07-13 (×3): 650 mg via ORAL
  Filled 2013-07-12 (×3): qty 2

## 2013-07-12 MED ORDER — SODIUM CHLORIDE 0.9 % IJ SOLN
3.0000 mL | Freq: Two times a day (BID) | INTRAMUSCULAR | Status: DC
  Administered 2013-07-12 – 2013-07-13 (×2): 3 mL via INTRAVENOUS

## 2013-07-12 MED ORDER — NITROGLYCERIN 0.4 MG SL SUBL
0.4000 mg | SUBLINGUAL_TABLET | Freq: Once | SUBLINGUAL | Status: AC
  Administered 2013-07-12: 0.4 mg via SUBLINGUAL

## 2013-07-12 MED ORDER — ATORVASTATIN CALCIUM 40 MG PO TABS
40.0000 mg | ORAL_TABLET | Freq: Every day | ORAL | Status: DC
  Administered 2013-07-12: 40 mg via ORAL
  Filled 2013-07-12: qty 1

## 2013-07-12 MED ORDER — NEBIVOLOL HCL 2.5 MG PO TABS
ORAL_TABLET | ORAL | Status: AC
  Filled 2013-07-12: qty 1

## 2013-07-12 MED ORDER — ALBUTEROL SULFATE (2.5 MG/3ML) 0.083% IN NEBU: 3.0000 mL | INHALATION_SOLUTION | Freq: Four times a day (QID) | RESPIRATORY_TRACT | Status: DC | PRN

## 2013-07-12 MED ORDER — MORPHINE SULFATE 2 MG/ML IJ SOLN: 1.0000 mg | INTRAMUSCULAR | Status: DC | PRN

## 2013-07-12 MED ORDER — NITROGLYCERIN 2 % TD OINT
1.0000 [in_us] | TOPICAL_OINTMENT | Freq: Once | TRANSDERMAL | Status: AC
  Administered 2013-07-12: 1 [in_us] via TOPICAL
  Filled 2013-07-12: qty 1

## 2013-07-12 MED ORDER — ACETAMINOPHEN 650 MG RE SUPP: 650.0000 mg | Freq: Four times a day (QID) | RECTAL | Status: DC | PRN

## 2013-07-12 MED ORDER — ONDANSETRON HCL 4 MG PO TABS: 4.0000 mg | ORAL_TABLET | Freq: Four times a day (QID) | ORAL | Status: DC | PRN

## 2013-07-12 MED ORDER — MORPHINE SULFATE 2 MG/ML IJ SOLN
2.0000 mg | Freq: Once | INTRAMUSCULAR | Status: AC
  Administered 2013-07-12: 2 mg via INTRAVENOUS
  Filled 2013-07-12: qty 1

## 2013-07-12 MED ORDER — HEPARIN BOLUS VIA INFUSION
4000.0000 [IU] | Freq: Once | INTRAVENOUS | Status: AC
  Administered 2013-07-12: 4000 [IU] via INTRAVENOUS

## 2013-07-12 MED ORDER — SODIUM CHLORIDE 0.9 % IV SOLN: 250.0000 mL | INTRAVENOUS | Status: DC | PRN

## 2013-07-12 MED ORDER — NITROGLYCERIN 2 % TD OINT
1.0000 [in_us] | TOPICAL_OINTMENT | Freq: Four times a day (QID) | TRANSDERMAL | Status: DC
  Administered 2013-07-12 – 2013-07-13 (×3): 1 [in_us] via TOPICAL
  Filled 2013-07-12 (×3): qty 1

## 2013-07-12 MED ORDER — NITROGLYCERIN IN D5W 200-5 MCG/ML-% IV SOLN
5.0000 ug/min | Freq: Once | INTRAVENOUS | Status: AC
  Administered 2013-07-12: 5 ug/min via INTRAVENOUS
  Filled 2013-07-12: qty 250

## 2013-07-12 MED ORDER — ALUM & MAG HYDROXIDE-SIMETH 200-200-20 MG/5ML PO SUSP: 30.0000 mL | Freq: Four times a day (QID) | ORAL | Status: DC | PRN

## 2013-07-12 MED ORDER — SENNA 8.6 MG PO TABS
1.0000 | ORAL_TABLET | Freq: Two times a day (BID) | ORAL | Status: DC
  Administered 2013-07-12 – 2013-07-13 (×2): 8.6 mg via ORAL
  Filled 2013-07-12 (×2): qty 1

## 2013-07-12 MED ORDER — ONDANSETRON HCL 4 MG/2ML IJ SOLN
4.0000 mg | Freq: Once | INTRAMUSCULAR | Status: AC
  Administered 2013-07-12: 4 mg via INTRAMUSCULAR
  Filled 2013-07-12: qty 2

## 2013-07-12 MED ORDER — BUDESONIDE-FORMOTEROL FUMARATE 160-4.5 MCG/ACT IN AERO
2.0000 | INHALATION_SPRAY | Freq: Two times a day (BID) | RESPIRATORY_TRACT | Status: DC
  Administered 2013-07-12 – 2013-07-13 (×2): 2 via RESPIRATORY_TRACT
  Filled 2013-07-12: qty 6

## 2013-07-12 MED ORDER — PANTOPRAZOLE SODIUM 40 MG PO TBEC
40.0000 mg | DELAYED_RELEASE_TABLET | Freq: Two times a day (BID) | ORAL | Status: DC
  Administered 2013-07-13: 40 mg via ORAL
  Filled 2013-07-12: qty 1

## 2013-07-12 MED ORDER — ASPIRIN EC 325 MG PO TBEC
325.0000 mg | DELAYED_RELEASE_TABLET | Freq: Every day | ORAL | Status: DC
  Administered 2013-07-13: 325 mg via ORAL
  Filled 2013-07-12: qty 1

## 2013-07-12 MED ORDER — PANTOPRAZOLE SODIUM 40 MG PO TBEC
40.0000 mg | DELAYED_RELEASE_TABLET | Freq: Every day | ORAL | Status: DC
  Administered 2013-07-12: 40 mg via ORAL
  Filled 2013-07-12: qty 1

## 2013-07-12 MED ORDER — DULOXETINE HCL 30 MG PO CPEP
30.0000 mg | ORAL_CAPSULE | Freq: Every day | ORAL | Status: DC
  Administered 2013-07-12 – 2013-07-13 (×2): 30 mg via ORAL
  Filled 2013-07-12 (×2): qty 1

## 2013-07-12 MED ORDER — NEBIVOLOL HCL 2.5 MG PO TABS
2.5000 mg | ORAL_TABLET | Freq: Every day | ORAL | Status: DC
  Administered 2013-07-12 – 2013-07-13 (×2): 2.5 mg via ORAL
  Filled 2013-07-12 (×4): qty 1

## 2013-07-12 MED ORDER — CLONAZEPAM 0.5 MG PO TABS
0.5000 mg | ORAL_TABLET | Freq: Two times a day (BID) | ORAL | Status: DC
  Administered 2013-07-12 – 2013-07-13 (×2): 0.5 mg via ORAL
  Filled 2013-07-12 (×2): qty 1

## 2013-07-12 MED ORDER — NICOTINE 14 MG/24HR TD PT24
14.0000 mg | MEDICATED_PATCH | Freq: Every day | TRANSDERMAL | Status: DC
  Administered 2013-07-12 – 2013-07-13 (×2): 14 mg via TRANSDERMAL
  Filled 2013-07-12 (×2): qty 1

## 2013-07-12 MED ORDER — HEPARIN (PORCINE) IN NACL 100-0.45 UNIT/ML-% IJ SOLN
750.0000 [IU]/h | INTRAMUSCULAR | Status: DC
  Administered 2013-07-12: 750 [IU]/h via INTRAVENOUS
  Filled 2013-07-12: qty 250

## 2013-07-12 MED ORDER — REGADENOSON 0.4 MG/5ML IV SOLN
0.4000 mg | Freq: Once | INTRAVENOUS | Status: DC
  Filled 2013-07-12: qty 5

## 2013-07-12 MED ORDER — ONDANSETRON HCL 4 MG/2ML IJ SOLN: 4.0000 mg | Freq: Four times a day (QID) | INTRAMUSCULAR | Status: DC | PRN

## 2013-07-12 MED ORDER — SODIUM CHLORIDE 0.9 % IJ SOLN: 3.0000 mL | INTRAMUSCULAR | Status: DC | PRN

## 2013-07-12 NOTE — Progress Notes (Signed)
   Subjective:    Patient ID: [name], female    DOB: Jun 28, 1955, 58 y.o.   MRN: [id number]  Shortness of Breath This is a new problem. The current episode started today. The problem occurs constantly. The problem has been unchanged. Associated symptoms include chest pain, headaches and syncope. Pertinent negatives include no abdominal pain, ear pain, fever, leg pain, leg swelling or wheezing. The symptoms are aggravated by any activity. Risk factors include smoking. Her past medical history is significant for CAD and COPD. There is no history of DVT.      Review of Systems  Constitutional: Negative for fever and chills.  HENT: Negative for ear pain.   Respiratory: Positive for chest tightness and shortness of breath. Negative for cough and wheezing.   Cardiovascular: Positive for chest pain, palpitations and syncope. Negative for leg swelling.  Gastrointestinal: Negative for abdominal pain.  Neurological: Positive for dizziness, weakness, light-headedness and headaches.       Objective:   Physical Exam  Constitutional: She is oriented to person, place, and time. She appears well-developed and well-nourished.  Cardiovascular: Normal rate, regular rhythm and normal heart sounds.   Pulmonary/Chest: Breath sounds normal. No respiratory distress. She exhibits no tenderness.  Neurological: She is alert and oriented to person, place, and time.  Skin: Skin is warm and dry.  Psychiatric: She has a normal mood and affect.          Assessment & Plan:  1. SOB (shortness of breath)  - EKG 12-Lead  2. Jaw pain, 3. Chest pain at rest  - nitroGLYCERIN (NITROSTAT) SL tablet 0.4 mg; Place 1 tablet (0.4 mg total) under the tongue once x 2. -911 notified for patient transport to [facility]  -Patient alert and and oriented prior to transport -patient's husband notified of patient condition and transport, he verbalized understanding -Patient verbalized understanding Erby Pian, FNP-C

## 2013-07-12 NOTE — Progress Notes (Signed)
ANTICOAGULATION CONSULT NOTE - Initial Consult  Pharmacy Consult for Heparin Indication: chest pain/ACS  Allergies  Allergen Reactions  . Lorcet 10-650 [Hydrocodone-Acetaminophen] Itching    Patient Measurements: Height: 5\' 5"  (165.1 cm) Weight: 142 lb (64.411 kg) IBW/kg (Calculated) : 57  Vital Signs: Temp: 98.1 F (36.7 C) (05/12 1147) Temp src: Oral (05/12 1147) BP: 116/78 mmHg (05/12 1418) Pulse Rate: 70 (05/12 1418)  Labs:  Recent Labs  07/12/13 1225  HGB 13.7  HCT 41.5  PLT 262  CREATININE 0.74  TROPONINI <0.30    Estimated Creatinine Clearance: 67.3 ml/min (by C-G formula based on Cr of 0.74).   Medical History: Past Medical History  Diagnosis Date  . COPD (chronic obstructive pulmonary disease)   . Hypertension   . Cancer   . Hypercholesteremia   . Depression   . Arthritis   . Tobacco user   . Perimenopausal   . Coronary artery disease     minimal nonobstructive   . Panic attacks    Medications:  Infusions:  . heparin    . heparin      Assessment: 58yo female admitted with c/o chest pain.  Asked to initiate IV Heparin for ACS.  Goal of Therapy:  Heparin level 0.3-0.7 units/ml Monitor platelets by anticoagulation protocol: Yes   Plan:  Heparin 4000 units IV x 1 now Heparin infusion at 12 units/Kg/Hr (750 units/hr) Heparin level in 6-8 hours then daily CBC daily while on Heparin  Nikhil Osei A Denesha Brouse 07/12/2013,2:28 PM

## 2013-07-12 NOTE — ED Notes (Signed)
Heparin bolus and drip verified with Geoffry Paradise

## 2013-07-12 NOTE — H&P (Signed)
[facility] History and Physical  [name] EZM:[id number] DOB: 30-May-1955 DOA: 07/12/2013  Referring physician: Dr. Dina Rich PCP: Redge Gainer, MD   Chief Complaint: Chest pain  HPI: [name] is a 58 y.o. female with a past medical history that includes hypertension, hyperlipidemia tobacco use presents to the emergency department chief complaint chest pain. She reports she was driving down the road and developed sudden sharp pain in her right shoulder that radiated to her neck. Associated symptoms include shortness of breath and mild diaphoresis. She denies nausea vomiting headache dizziness visual disturbances numbness tingling of her extremities. She denies syncope or near-syncope. She went to her primary care provider's office who did an EKG and she reportedly said it was "okay". They gave her an aspirin and called EMS for transport. In the emergency department she has a basic metabolic panel that is unremarkable complete blood count is unremarkable and initial troponin is negative. Her chest x-ray yields no evidence of CHF or pneumonia or any other acute cardiopulmonary abnormality. EKG yields normal sinus rhythm.  Receives nitroglycerin which improved the pain from an 8 to a 5 on scale of 10. At the time of my exam she has mild pain at a rate of 2. Heparin drip and nitroglycerin drip were initiated in the emergency department. She is afebrile hemodynamically stable and not hypoxic.  Review of Systems:  Temporal review of systems completed and all systems are negative except as indicated in the history of present illness Past Medical History  Diagnosis Date  . COPD (chronic obstructive pulmonary disease)   . Hypertension   . Cancer   . Hypercholesteremia   . Depression   . Arthritis   . Tobacco user   . Perimenopausal   . Coronary artery disease     minimal nonobstructive   . Panic attacks    Past Surgical History  Procedure Laterality Date  . Abdominal  hysterectomy    . Vocal cord      polyp removal  . Right foot toe surgery     Social History:  reports that she has been smoking Cigarettes.  She started smoking about 44 years ago. She has been smoking about 1.50 packs per day. She does not have any smokeless tobacco history on file. She reports that she does not drink alcohol or use illicit drugs. She lives alone she is unemployed she is independent with ADLs Allergies  Allergen Reactions  . Lorcet 10-650 [Hydrocodone-Acetaminophen] Itching    Family History  Problem Relation Age of Onset  . Heart disease Mother   . Heart attack Mother   . Heart attack Father   . Heart attack Brother      Prior to Admission medications   Medication Sig Start Date End Date Taking? Authorizing Provider  albuterol (PROVENTIL HFA;VENTOLIN HFA) 108 (90 BASE) MCG/ACT inhaler Inhale 2 puffs into the lungs every 6 (six) hours as needed for wheezing. 05/16/13 09/09/14 Yes Lysbeth Penner, FNP  amLODipine (NORVASC) 5 MG tablet TAKE 1 TABLET EVERY DAY   Yes Lysbeth Penner, FNP  aspirin EC 81 MG tablet Take 81 mg by mouth daily.   Yes Historical Provider, MD  atorvastatin (LIPITOR) 40 MG tablet TAKE 1 TABLET AT BEDTIME   Yes Lysbeth Penner, FNP  budesonide-formoterol [facility]) 160-4.5 MCG/ACT inhaler Inhale 2 puffs into the lungs 2 (two) times daily. 05/16/13  Yes Lysbeth Penner, FNP  citalopram (CELEXA) 20 MG tablet Take 1 tablet (20 mg total) by mouth daily.  02/11/13  Yes Mary-Margaret Hassell Done, FNP  clonazePAM (KLONOPIN) 0.5 MG tablet TAKE 1 TABLET BY MOUTH TWICE DAILY   Yes Mary-Margaret Hassell Done, FNP  DULoxetine (CYMBALTA) 30 MG capsule Take 1 capsule (30 mg total) by mouth daily. 04/05/13  Yes Mary-Margaret Hassell Done, FNP  fish oil-omega-3 fatty acids 1000 MG capsule Take 1 g by mouth daily.    Yes Historical Provider, MD  lisinopril (PRINIVIL,ZESTRIL) 20 MG tablet TAKE 1 TABLET EVERY DAY   Yes Lysbeth Penner, FNP  loratadine (CLARITIN) 10 MG tablet Take 1  tablet (10 mg total) by mouth daily. 08/30/12  Yes Mary-Margaret Hassell Done, FNP  oxyCODONE (ROXICODONE) 15 MG immediate release tablet Take 15 mg by mouth every 4 (four) hours as needed for pain.   Yes Historical Provider, MD  pantoprazole (PROTONIX) 40 MG tablet TAKE 1 TABLET AT 12 NOON DAILY 05/25/13  Yes Mary-Margaret Hassell Done, FNP   Physical Exam: Filed Vitals:   07/12/13 1530  BP: 130/82  Pulse: 64  Temp:   Resp: 13    BP 130/82  Pulse 64  Temp(Src) 98.1 F (36.7 C) (Oral)  Resp 13  Ht 5\' 5"  (1.651 m)  Wt 64.411 kg (142 lb)  BMI 23.63 kg/m2  SpO2 99%  General:  Appears calm and comfortable Eyes: PERRL, normal lids, irises & conjunctiva ENT: grossly normal hearing, lips & tongue Neck: no LAD, masses or thyromegaly Cardiovascular: RRR, no m/r/g. No LE edema. Telemetry: SR, no arrhythmias  Respiratory: CTA bilaterally, no w/r/r. Normal respiratory effort. Abdomen: soft, ntnd positive bowel sounds throughout Skin: Bilateral forearms with bruising Musculoskeletal: Fair muscle tone. Joints without swelling/erythema Psychiatric: grossly normal mood and affect, speech fluent and appropriate Neurologic: grossly non-focal. Speech clear facial symmetry           Labs on Admission:  Basic Metabolic Panel:  Recent Labs Lab 07/12/13 1225  NA 143  K 3.7  CL 104  CO2 30  GLUCOSE 99  BUN 19  CREATININE 0.74  CALCIUM 9.2   Liver Function Tests: No results found for this basename: AST, ALT, ALKPHOS, BILITOT, PROT, ALBUMIN,  in the last 168 hours No results found for this basename: LIPASE, AMYLASE,  in the last 168 hours No results found for this basename: AMMONIA,  in the last 168 hours CBC:  Recent Labs Lab 07/12/13 1225  WBC 9.6  NEUTROABS 6.5  HGB 13.7  HCT 41.5  MCV 93.5  PLT 262   Cardiac Enzymes:  Recent Labs Lab 07/12/13 1225 07/12/13 1447  TROPONINI <0.30 <0.30    BNP (last 3 results) No results found for this basename: PROBNP,  in the last 8760  hours CBG: No results found for this basename: GLUCAP,  in the last 168 hours  Radiological Exams on Admission: Dg Chest Portable 1 View  07/12/2013   CLINICAL DATA:  The right-sided PICC an arm and jaw pain  EXAM: PORTABLE CHEST - 1 VIEW  COMPARISON:  DG CHEST 2V dated 02/26/2013  FINDINGS: The lungs are adequately inflated and clear. The cardiac silhouette is normal in size. The pulmonary vascularity is not engorged. There is no pleural effusion or pneumothorax. The mediastinum is normal in width. The observed portions of the bony thorax appear normal.  IMPRESSION: There is no evidence of CHF nor pneumonia nor other acute cardiopulmonary abnormality.   Electronically Signed   By: David  Martinique   On: 07/12/2013 12:37    EKG: Independently reviewed. Normal sinus  Assessment/Plan Principal Problem:   Chest pain: Atypical. Will  admit to step down. Nitroglycerin and heparin drip started in the emergency department and at the time of my exam she still complained of pain rated 2/10. Risk factors include smoking and fairly strong family history of early cardiac disease. We will cycle cardiac enzymes, get serial EKGs, obtain TSH lipid panel hemoglobin A1c for risk stratification. Will continue heparin drip and likely discontinue nitroglycerin drip and use nitroglycerin paste instead. Continue asa and statin Will request cardiology consult. Active Problems: COPD (chronic obstructive pulmonary disease): Appears to be stable at baseline. Continue home medication  HYPERTENSION: I medications include amlodipine and lisinopril. Will hold these for now given #1.    ANXIETY DEPRESSION: Appears stable at baseline. Continue home medication    TOBACCO ABUSE: Smoking cessation counseling. Patient requesting Pap      ARTHRITIS; stable at baselin    Hyperlipidemia: will check lipid panel. Continue statin.   Dr. Harl Bowie Code Status: full Family Communication: none Disposition Plan: home   Time spent: 93  minutes  [facility] Pager [phone]   Patient seen and examined. Agree with assessment, plan and findings as mentioned above. Patient came to ED secondary to CP, atypical in presentation but with typical characteristics and significant risk factors. Heart score 4. Currently CP free and complaining of headaches from NTG. Plan: -admit for clinical observation and ACS R/O -Cardiology consulted and planning for lexican in am -will cycle troponin, EKG's and check 2-D echo -as part of stratification will check A1C, lipid panel and TSH -patient started on heparin drip as recommended by cardiology, continue statins, ASA and NTG paste -will add low dose bystolic.  Barton Dubois [phone]

## 2013-07-12 NOTE — ED Provider Notes (Signed)
CSN: [id number]     Arrival date & time 07/12/13  1148 History  This chart was scribed for Merryl Hacker, MD by Eston Mould, ED Scribe. This patient was seen in room APA12/APA12 and the patient's care was started at 12:02 PM.   Chief Complaint  Patient presents with  . Chest Pain   The history is provided by the patient. No language interpreter was used.   HPI Comments: [name] is a 58 y.o. female who presents to the Emergency Department complaining of sudden R shoulder pain and CP that began today. She states she was driving to visit a friend and began having R shoulder pain that radiated up her neck . Felt SOB and diaphoretic. Rates her current pain 5/10. Pain improved with nitroglycerin from an 8 to 5.  Reports having pain at this moment. Full dose aspirin given by EMS. Patient reports history of hypercholesterolemia, hypertension, early family history of heart disease. Pt is a daily smoker. Denies injuries or recent falls.  Past Medical History  Diagnosis Date  . COPD (chronic obstructive pulmonary disease)   . Hypertension   . Cancer   . Hypercholesteremia   . Depression   . Arthritis   . Tobacco user   . Perimenopausal   . Coronary artery disease     minimal nonobstructive   . Panic attacks    Past Surgical History  Procedure Laterality Date  . Abdominal hysterectomy    . Vocal cord      polyp removal  . Right foot toe surgery     Family History  Problem Relation Age of Onset  . Heart disease Mother   . Heart attack Mother   . Heart attack Father   . Heart attack Brother    History  Substance Use Topics  . Smoking status: Current Every Day Smoker -- 1.50 packs/day    Types: Cigarettes    Start date: 05/01/1969  . Smokeless tobacco: Not on file  . Alcohol Use: No   OB History   Grav Para Term Preterm Abortions TAB SAB Ect Mult Living                 Review of Systems  Constitutional: Positive for diaphoresis. Negative for fever.   Respiratory: Positive for shortness of breath. Negative for cough and chest tightness.   Cardiovascular: Positive for chest pain.  Gastrointestinal: Negative for nausea, vomiting and abdominal pain.  Genitourinary: Negative for dysuria.  Skin: Negative for wound.  Neurological: Negative for headaches.  All other systems reviewed and are negative.  Allergies  Lorcet 10-650  Home Medications   Prior to Admission medications   Medication Sig Start Date End Date Taking? Authorizing Provider  albuterol (PROVENTIL HFA;VENTOLIN HFA) 108 (90 BASE) MCG/ACT inhaler Inhale 2 puffs into the lungs every 6 (six) hours as needed for wheezing. 05/16/13 09/09/14  Lysbeth Penner, FNP  amLODipine (NORVASC) 5 MG tablet TAKE 1 TABLET EVERY DAY    Lysbeth Penner, FNP  aspirin EC 81 MG tablet Take 81 mg by mouth daily.    Historical Provider, MD  atorvastatin (LIPITOR) 40 MG tablet TAKE 1 TABLET AT BEDTIME    Lysbeth Penner, FNP  budesonide-formoterol [facility]) 160-4.5 MCG/ACT inhaler Inhale 2 puffs into the lungs 2 (two) times daily. 05/16/13   Lysbeth Penner, FNP  citalopram (CELEXA) 20 MG tablet Take 1 tablet (20 mg total) by mouth daily. 02/11/13   Mary-Margaret Hassell Done, FNP  clonazePAM (KLONOPIN) 0.5 MG tablet  TAKE 1 TABLET BY MOUTH TWICE DAILY    Mary-Margaret Hassell Done, FNP  DULoxetine (CYMBALTA) 30 MG capsule Take 1 capsule (30 mg total) by mouth daily. 04/05/13   Mary-Margaret Hassell Done, FNP  fish oil-omega-3 fatty acids 1000 MG capsule Take 1 g by mouth daily.     Historical Provider, MD  fluticasone (FLONASE) 50 MCG/ACT nasal spray Place 2 sprays into the nose daily. 11/11/12   Mary-Margaret Hassell Done, FNP  lisinopril (PRINIVIL,ZESTRIL) 20 MG tablet TAKE 1 TABLET EVERY DAY    Lysbeth Penner, FNP  loratadine (CLARITIN) 10 MG tablet Take 1 tablet (10 mg total) by mouth daily. 08/30/12   Mary-Margaret Hassell Done, FNP  lubiprostone (AMITIZA) 24 MCG capsule Take 1 capsule (24 mcg total) by mouth 2 (two) times  daily with a meal. 11/15/12   Mary-Margaret Hassell Done, FNP  oxyCODONE (ROXICODONE) 15 MG immediate release tablet Take 15 mg by mouth every 4 (four) hours as needed for pain.    Historical Provider, MD  oxyCODONE-acetaminophen (PERCOCET) 10-325 MG per tablet Take 1 tablet by mouth every 4 (four) hours as needed for pain.    Historical Provider, MD  pantoprazole (PROTONIX) 40 MG tablet TAKE 1 TABLET AT 12 NOON DAILY 05/25/13   Mary-Margaret Hassell Done, FNP   BP 116/78  Pulse 70  Temp(Src) 98.1 F (36.7 C) (Oral)  Resp 14  Ht 5\' 5"  (1.651 m)  Wt 142 lb (64.411 kg)  BMI 23.63 kg/m2  SpO2 94%  Physical Exam  Nursing note and vitals reviewed. Constitutional: She is oriented to person, place, and time. She appears well-developed and well-nourished. No distress.  HENT:  Head: Normocephalic and atraumatic.  Mouth/Throat: Oropharynx is clear and moist.  Cardiovascular: Normal rate, regular rhythm and normal heart sounds.   No murmur heard. Pulmonary/Chest: Effort normal and breath sounds normal. No respiratory distress. She has no wheezes. She exhibits no tenderness.  Abdominal: Soft. Bowel sounds are normal. There is no tenderness. There is no rebound.  Musculoskeletal: She exhibits no edema.  Neurological: She is alert and oriented to person, place, and time.  Skin: Skin is warm and dry.  Psychiatric: She has a normal mood and affect.    ED Course  Procedures  CRITICAL CARE Performed by: Merryl Hacker   Total critical care time: 35 min  Critical care time was exclusive of separately billable procedures and treating other patients.  Critical care was necessary to treat or prevent imminent or life-threatening deterioration.  Critical care was time spent personally by me on the following activities: development of treatment plan with patient and/or surrogate as well as nursing, discussions with consultants, evaluation of patient's response to treatment, examination of patient, obtaining  history from patient or surrogate, ordering and performing treatments and interventions, ordering and review of laboratory studies, ordering and review of radiographic studies, pulse oximetry and re-evaluation of patient's condition.  DIAGNOSTIC STUDIES: Oxygen Saturation is 99% on RA, normal by my interpretation.    COORDINATION OF CARE: 12:05 PM-Discussed treatment plan which includes administer nitro while in ED and evaluated pts prior hx.  Pt agreed to plan.   Labs Review Labs Reviewed  CBC WITH DIFFERENTIAL  BASIC METABOLIC PANEL  TROPONIN I  PROTIME-INR  APTT  HEPARIN LEVEL (UNFRACTIONATED)  TROPONIN I  TROPONIN I  TROPONIN I   Imaging Review Dg Chest Portable 1 View  07/12/2013   CLINICAL DATA:  The right-sided PICC an arm and jaw pain  EXAM: PORTABLE CHEST - 1 VIEW  COMPARISON:  DG CHEST  2V dated 02/26/2013  FINDINGS: The lungs are adequately inflated and clear. The cardiac silhouette is normal in size. The pulmonary vascularity is not engorged. There is no pleural effusion or pneumothorax. The mediastinum is normal in width. The observed portions of the bony thorax appear normal.  IMPRESSION: There is no evidence of CHF nor pneumonia nor other acute cardiopulmonary abnormality.   Electronically Signed   By: David  Martinique   On: 07/12/2013 12:37     EKG Interpretation None      EKG independently reviewed by myself: Normal sinus rhythm with rate of 73, no evidence of ST elevation or acute ischemia  MDM   Final diagnoses:  Chest pain    Patient presents with right shoulder and chest pain associated with shortness of breath and diaphoresis. Multiple risk factors including hypertension, hypercholesterolemia, family history, and current smoking. Patient reports improvement of symptoms with nitroglycerin by EMS.  Nitroglycerin was placed. EKG is nonischemic at this time. Initial troponin is negative. Patient reports improvement of pain with nitroglycerin paste. Will start  nitroglycerin drip and heparin drip for concern for ACS. I reviewed the patient's chart. She does have nonobstructive disease from a cardiac cath in 2011 and had a Myoview in 2013 it was nonischemic. She has not followed up cardiologist. Discuss with Dr. branch who agrees for admission for further cardiac evaluation.  Patient to be admitted to SDU.  I personally performed the services described in this documentation, which was scribed in my presence. The recorded information has been reviewed and is accurate.    Merryl Hacker, MD 07/12/13 1447

## 2013-07-12 NOTE — Patient Instructions (Signed)

## 2013-07-12 NOTE — Consult Note (Signed)
Primary cardiologist: Consulting cardiologist:  Clinical Summary Ms. Bokhari is a 58 y.o.female history of HTN, atypical chest pain with non obstructive CAD by cath in 2008 and negative Lexiscan MPI 05/2011, COPD, hyperlipidemia admitted with right shoulder and chest pain. Symptoms started around 9AM this morning while she was driving. 1/47 aching pain in right shoulder, neck, and right upper chest. Not worst with movement. Had some SOB and diaphoresis with it, + headache. Different from prior chest pain. States symptoms constant since 9AM, reports some relief both with IV morphine and SL NG. Describes chronic DOE at < 1/2 block this is unchanged.    EKG NSR with no ischemic changes, trop neg x 1   Allergies  Allergen Reactions  . Lorcet 10-650 [Hydrocodone-Acetaminophen] Itching    Medications Scheduled Medications:     Infusions: . heparin 750 Units/hr (07/12/13 1500)     PRN Medications:     Past Medical History  Diagnosis Date  . COPD (chronic obstructive pulmonary disease)   . Hypertension   . Cancer   . Hypercholesteremia   . Depression   . Arthritis   . Tobacco user   . Perimenopausal   . Coronary artery disease     minimal nonobstructive   . Panic attacks     Past Surgical History  Procedure Laterality Date  . Abdominal hysterectomy    . Vocal cord      polyp removal  . Right foot toe surgery      Family History  Problem Relation Age of Onset  . Heart disease Mother   . Heart attack Mother   . Heart attack Father   . Heart attack Brother     Social History Ms. Joens reports that she has been smoking Cigarettes.  She started smoking about 44 years ago. She has been smoking about 1.50 packs per day. She does not have any smokeless tobacco history on file. Ms. Dyk reports that she does not drink alcohol.  Review of Systems CONSTITUTIONAL: No weight loss, fever, chills, weakness or fatigue.  HEENT: Eyes: No visual loss, blurred vision,  double vision or yellow sclerae. No hearing loss, sneezing, congestion, runny nose or sore throat.  SKIN: No rash or itching.  CARDIOVASCULAR: per HPI RESPIRATORY: No shortness of breath, cough or sputum.  GASTROINTESTINAL: No anorexia, nausea, vomiting or diarrhea. No abdominal pain or blood.  GENITOURINARY: no polyuria, no dysuria NEUROLOGICAL: No headache, dizziness, syncope, paralysis, ataxia, numbness or tingling in the extremities. No change in bowel or bladder control.  MUSCULOSKELETAL: No muscle, back pain, joint pain or stiffness.  HEMATOLOGIC: No anemia, bleeding or bruising.  LYMPHATICS: No enlarged nodes. No history of splenectomy.  PSYCHIATRIC: No history of depression or anxiety.      Physical Examination Blood pressure 116/78, pulse 70, temperature 98.1 F (36.7 C), temperature source Oral, resp. rate 14, height 5\' 5"  (1.651 m), weight 142 lb (64.411 kg), SpO2 94.00%. No intake or output data in the 24 hours ending 07/12/13 1506  Cardiovascular: RRR, 2/6 systolic murmur RUSB, no JVD  Respiratory: CTAB  GI: soft, NT, ND  MSK: no LE edema. Right shoulder and chest tender to palpation  Neuro: no focal deficits  Psych: appropriate affect   Lab Results  Basic Metabolic Panel:  Recent Labs Lab 07/12/13 1225  NA 143  K 3.7  CL 104  CO2 30  GLUCOSE 99  BUN 19  CREATININE 0.74  CALCIUM 9.2    Liver Function Tests: No results found for  this basename: AST, ALT, ALKPHOS, BILITOT, PROT, ALBUMIN,  in the last 168 hours  CBC:  Recent Labs Lab 07/12/13 1225  WBC 9.6  NEUTROABS 6.5  HGB 13.7  HCT 41.5  MCV 93.5  PLT 262    Cardiac Enzymes:  Recent Labs Lab 07/12/13 1225  TROPONINI <0.30    BNP: No components found with this basename: POCBNP,    ECG NSR  Imaging 07/2011 Lexiscan MPI There was both motion artifact and bowel artifact. Resting images  were normal. With stress there was a suggestion of a small area of  redistribution in the  inferobase. SDS 5 in this region. However  this was not seen in the vertical images.  Quantitative EF normal EF 63% with no definitive RWMA;s.  Impression: Low risk myovue. In the setting of atypical pain,  normal cath in 2008 and suggestion of ischemia in small area of  inferobase only in one image plane and not orthogonally would D/C  and F/U as outpatient.  05/2011 Echo Study Conclusions  - Left ventricle: The cavity size was normal. There was mild focal basal hypertrophy of the septum. Systolic function was normal. The estimated ejection fraction was in the range of 60% to 65%. Wall motion was normal; there were no regional wall motion abnormalities. Doppler parameters are consistent with abnormal left ventricular relaxation (grade 1 diastolic dysfunction). - Aortic valve: Probably trileaflet; mildly calcified leaflets. Trivial regurgitation. - Left atrium: The atrium was at the upper limits of normal in size. - Tricuspid valve: Trivial regurgitation. - Pericardium, extracardiac: There was no pericardial effusion.  06/2006 Cath FINDINGS: Aortic pressure 125/78 with a mean of 99. Left ventricular  pressure 125/5 with an end-diastolic pressure of 10.  The left mainstem is angiographically normal. It bifurcates into the  LAD and left circumflex.  The LAD is a large-caliber vessel that courses down to the LV apex. It  gives off a first diagonal Lakya Schrupp and is a large vessel. There is no  significant disease in the LAD or diagonal system.  Left circumflex is a large-caliber vessel. There is a small  intermediate Aryani Daffern present. There are really no substantial obtuse  marginal branches from the left circumflex present. The left circumflex  courses down the AV groove and gives off a large posterolateral Fareedah Mahler.  The mid portion of the left circumflex has a long segment of 30-40%  stenosis.  The right coronary artery is a large-caliber vessel. It terminates  distally in a PDA and  posterior AV segment which gives off two  posterolateral branches. The mid portion of the right coronary artery  provides a small RV marginal Sorayah Schrodt, and the proximal portion has a  small conus Summerlyn Fickel present. There are minor luminal irregularities in  the proximal portion of the right coronary artery, but there is no  significant angiographic disease.  Left ventriculography performed in the 30-degree RAO projection  demonstrates normal left ventricular systolic function with an LVEF of  55%. There is no mitral regurgitation.  ASSESSMENT:  1. Nonobstructive coronary artery disease, predominantly involving the  left circumflex system.  2. Normal left ventricular function.  3. Intracoronary vascular ultrasound demonstration of minimal plaque  in the right coronary artery.  PLAN: Ms. Keddy will require aggressive medical therapy. She needs  tobacco cessation and progressive statin therapy. She will receive  her statin therapy through the [facility] protocol. She should be on a  daily aspirin as well. She will be a candidate for early discharge as  her  chest pain appears to have been noncardiac, and she has had no  further pain.    Impression/Recommendations  1. Chest pain - mixed characteristics for cardiac chest pain. On my exam it is somewhat reproducible. She also reports a somewhat constant pain since 9AM that has varied in intensity but never completely resolved, unlikely for cardiac pain to last several hours, especially with normal EKG and cardiac enzymes - she has very strong CAD risk factors, including a strong FH with a father who died with MI at 79, mother with open heart surgery at 57, and brother with MI in his early 27s - she has been started on anticoagulation in ER, if true angina her TIMI score is 3, and anticoag is reasonable. Initiated on NG drip for symptoms, can continue and follow symptoms, I'm not convinced this is cardiac.  - follow up echo, plan for Lexiscan MPI in  AM - continue home ASA. Check AM lipid panel.    Carlyle Dolly, M.D., F.A.C.C.

## 2013-07-12 NOTE — ED Notes (Signed)
Started with pain in R arm this morning around 0900. Pain radiated to neck and rt shoulder area, along with indigestion/cp epigastric area.

## 2013-07-13 ENCOUNTER — Inpatient Hospital Stay (HOSPITAL_COMMUNITY): Payer: Medicaid Other

## 2013-07-13 ENCOUNTER — Encounter (HOSPITAL_COMMUNITY): Payer: Self-pay

## 2013-07-13 DIAGNOSIS — R079 Chest pain, unspecified: Secondary | ICD-10-CM

## 2013-07-13 DIAGNOSIS — I359 Nonrheumatic aortic valve disorder, unspecified: Secondary | ICD-10-CM

## 2013-07-13 LAB — CBC
HEMATOCRIT: 38.3 % (ref 36.0–46.0)
HEMOGLOBIN: 12.5 g/dL (ref 12.0–15.0)
MCH: 30.6 pg (ref 26.0–34.0)
MCHC: 32.6 g/dL (ref 30.0–36.0)
MCV: 93.6 fL (ref 78.0–100.0)
Platelets: 238 10*3/uL (ref 150–400)
RBC: 4.09 MIL/uL (ref 3.87–5.11)
RDW: 13.6 % (ref 11.5–15.5)
WBC: 9.5 10*3/uL (ref 4.0–10.5)

## 2013-07-13 LAB — LIPID PANEL
Cholesterol: 148 mg/dL (ref 0–200)
HDL: 45 mg/dL (ref >39)
LDL CALC: 67 mg/dL (ref 0–99)
Total CHOL/HDL Ratio: 3.3 RATIO
Triglycerides: 182 mg/dL — ABNORMAL HIGH (ref <150)
VLDL: 36 mg/dL (ref 0–40)

## 2013-07-13 LAB — BASIC METABOLIC PANEL
BUN: 21 mg/dL (ref 6–23)
CO2: 29 mEq/L (ref 19–32)
CREATININE: 0.71 mg/dL (ref 0.50–1.10)
Calcium: 8.7 mg/dL (ref 8.4–10.5)
Chloride: 103 mEq/L (ref 96–112)
GFR calc non Af Amer: >90 mL/min (ref >90)
GLUCOSE: 118 mg/dL — AB (ref 70–99)
Potassium: 3.8 mEq/L (ref 3.7–5.3)
Sodium: 140 mEq/L (ref 137–147)

## 2013-07-13 LAB — HEPARIN LEVEL (UNFRACTIONATED): Heparin Unfractionated: 0.45 IU/mL (ref 0.30–0.70)

## 2013-07-13 LAB — TSH: TSH: 1.37 u[IU]/mL (ref 0.350–4.500)

## 2013-07-13 LAB — TROPONIN I

## 2013-07-13 MED ORDER — SODIUM CHLORIDE 0.9 % IJ SOLN
INTRAMUSCULAR | Status: AC
  Administered 2013-07-13: 10 mL via INTRAVENOUS
  Filled 2013-07-13: qty 10

## 2013-07-13 MED ORDER — TECHNETIUM TC 99M SESTAMIBI - CARDIOLITE
30.0000 | Freq: Once | INTRAVENOUS | Status: AC | PRN
  Administered 2013-07-13: 12:00:00 30 via INTRAVENOUS

## 2013-07-13 MED ORDER — REGADENOSON 0.4 MG/5ML IV SOLN
INTRAVENOUS | Status: AC
  Administered 2013-07-13: 0.4 mg via INTRAVENOUS
  Filled 2013-07-13: qty 5

## 2013-07-13 MED ORDER — TECHNETIUM TC 99M SESTAMIBI GENERIC - CARDIOLITE
10.0000 | Freq: Once | INTRAVENOUS | Status: AC | PRN
  Administered 2013-07-13: 10 via INTRAVENOUS

## 2013-07-13 MED ORDER — HEPARIN (PORCINE) IN NACL 100-0.45 UNIT/ML-% IJ SOLN
1000.0000 [IU]/h | INTRAMUSCULAR | Status: DC
Start: 2013-07-13 — End: 2013-07-13
  Administered 2013-07-13: 1000 [IU]/h via INTRAVENOUS
  Filled 2013-07-13: qty 250

## 2013-07-13 MED ORDER — NICOTINE 14 MG/24HR TD PT24
14.0000 mg | MEDICATED_PATCH | Freq: Every day | TRANSDERMAL | Status: DC
Start: 2013-07-13 — End: 2013-07-20

## 2013-07-13 NOTE — Progress Notes (Signed)
  Echocardiogram 2D Echocardiogram has been performed.  Najae Filsaime L Chanay Nugent 07/13/2013, 11:11 AM

## 2013-07-13 NOTE — Progress Notes (Signed)
Stress Lab Nurses Notes - [place] 07/13/2013 Reason for doing test: Chest Pain Type of test: Wille Glaser Nurse performing test: Gerrit Halls, RN Nuclear Medicine Tech: Redmond Baseman Echo Tech: Not Applicable MD performing test: Branch/K.Lawrence NP Family MD: Dr. Dyann Kief Test explained and consent signed: yes IV started: Saline lock flushed, IV in progress from floor and No redness or edema Symptoms: Stomach discomfort Treatment/Intervention: None Reason test stopped: protocol completed After recovery IV was: No redness or edema and Saline Lock flushed Patient to return to [place]. Med at : 12:10 Patient discharged: Transported back to room ICU-01 via wc Patient's Condition upon discharge was: stable Comments: During stress test BP 114/79 & HR 105.  Recovery BP 122/81 & HR 85.  Symptoms resolved in recovery. Donnajean Lopes

## 2013-07-13 NOTE — Progress Notes (Signed)
Patient ID: [name], female   DOB: 09-Dec-1955, 58 y.o.   MRN: [id number]    Subjective:   Chest pain resolved last night.   Objective:   Temp:  [97.7 F (36.5 C)-98.6 F (37 C)] 98.1 F (36.7 C) (05/13 0400) Pulse Rate:  [64-105] 67 (05/12 1629) Resp:  [10-19] 10 (05/13 0500) BP: (96-151)/(53-96) 101/66 mmHg (05/13 0500) SpO2:  [92 %-100 %] 100 % (05/12 1912) Weight:  [142 lb (64.411 kg)-150 lb (68.04 kg)] 147 lb 11.3 oz (67 kg) (05/13 0500) Last BM Date: 07/11/13  Filed Weights   07/12/13 1147 07/12/13 1629 07/13/13 0500  Weight: 142 lb (64.411 kg) 147 lb 4.3 oz (66.8 kg) 147 lb 11.3 oz (67 kg)   No intake or output data in the 24 hours ending 07/13/13 0743  Telemetry:NSR  Exam:  General: NAD  Resp: CTAB  Cardiac: RRR, 2/6 systolic murmur RUSB, no JVD  GI: abdomen soft, NT, ND  MSK: no LE edema  Neuro:  No focal deficits  Psych: appropriate affect  Lab Results:  Basic Metabolic Panel:  Recent Labs Lab 07/12/13 1225 07/13/13 0243  NA 143 140  K 3.7 3.8  CL 104 103  CO2 30 29  GLUCOSE 99 118*  BUN 19 21  CREATININE 0.74 0.71  CALCIUM 9.2 8.7    Liver Function Tests:  Recent Labs Lab 07/12/13 1225  AST 14  ALT 11  ALKPHOS 79  BILITOT 0.2*  PROT 6.4  ALBUMIN 3.4*    CBC:  Recent Labs Lab 07/12/13 1225 07/13/13 0243  WBC 9.6 9.5  HGB 13.7 12.5  HCT 41.5 38.3  MCV 93.5 93.6  PLT 262 238    Cardiac Enzymes:  Recent Labs Lab 07/12/13 1823 07/12/13 2034 07/13/13 0243  TROPONINI <0.30 <0.30 <0.30    BNP: No results found for this basename: PROBNP,  in the last 8760 hours  Coagulation:  Recent Labs Lab 07/12/13 1447  INR 0.90    ECG:   Medications:   Scheduled Medications: . aspirin EC  325 mg Oral Daily  . atorvastatin  40 mg Oral q1800  . budesonide-formoterol  2 puff Inhalation BID  . clonazePAM  0.5 mg Oral BID  . DULoxetine  30 mg Oral Daily  . nebivolol  2.5 mg Oral Daily  . nicotine  14 mg  Transdermal Daily  . pantoprazole  40 mg Oral BID  . regadenoson  0.4 mg Intravenous Once  . senna  1 tablet Oral BID  . sodium chloride  3 mL Intravenous Q12H     Infusions: . heparin 1,000 Units/hr (07/13/13 0238)     PRN Medications:  sodium chloride, acetaminophen, acetaminophen, albuterol, alum & mag hydroxide-simeth, morphine injection, ondansetron (ZOFRAN) IV, ondansetron, sodium chloride     Assessment/Plan    1. Chest pain  - mixed characteristics for cardiac chest pain. On my exam it is somewhat reproducible. She also reports a somewhat constant pain since 9AM that has varied in intensity but never completely resolved, unlikely for cardiac pain to last several hours, especially with normal EKG and cardiac enzymes  - she has very strong CAD risk factors, including a strong FH with a father who died with MI at 74, mother with open heart surgery at 48, and brother with MI in his early 73s  - she has been started on anticoagulation in ER, if true angina her TIMI score is 3, and anticoag is reasonable.  - troponins remain negative overnight, will f/u echo and  nuclear stress today  2. Hyperlipidemia - lipids at goal, continue current statin        Carlyle Dolly, M.D., F.A.C.C.

## 2013-07-13 NOTE — Discharge Summary (Signed)
Physician Discharge Summary  [name] NAT:[id number] DOB: [birth year] DOA: 07/12/2013  PCP: Redge Gainer, MD  Admit date: 07/12/2013 Discharge date: 07/13/2013  Time spent: >30 minutes  Recommendations for Outpatient Follow-up:  1. Follow up with PCP 1-2 week for evaluation of symptoms. No need for further inpatient cardiac work up.  2. BMET to follow electrolytes and renal function 3. Follow and help patient in tobacco cessation journey    Discharge Diagnoses:  Principal Problem:   Chest pain Active Problems:   ANXIETY DEPRESSION   TOBACCO ABUSE   HYPERTENSION   ARTHRITIS   Hyperlipidemia   COPD (chronic obstructive pulmonary disease)   Discharge Condition: stable and improved. Discharged home with instruction to quit smoking and to follow with PCP in 1-2 weeks.  Diet recommendation: heart healthy  Filed Weights   07/12/13 1147 07/12/13 1629 07/13/13 0500  Weight: 64.411 kg (142 lb) 66.8 kg (147 lb 4.3 oz) 67 kg (147 lb 11.3 oz)    History of present illness:  [name] is a 58 y.o. female with a past medical history that includes hypertension, hyperlipidemia tobacco use presented to the emergency department on 07/12/13 chief complaint chest pain. She reported she was driving down the road and developed sudden sharp pain in her right shoulder that radiated to her neck. Associated symptoms included shortness of breath and mild diaphoresis. She denied nausea vomiting headache dizziness visual disturbances numbness tingling of her extremities. She denied syncope or near-syncope. She went to her primary care provider's office who did an EKG and she reportedly said it was "okay". They gave her an aspirin and called EMS for transport. In the emergency department she had a basic metabolic panel that was unremarkable complete blood count was unremarkable and initial troponin was negative. Her chest x-ray yielded no evidence of CHF or pneumonia or any other acute cardiopulmonary  abnormality. EKG yielded normal sinus rhythm. ED MD discussed with cardiology who recommended initiating Heparin gtt and NTG gtt and admission with hospitalist service. Pain improved after meds started.   Hospital Course:  Chest pain: Atypical with typical features and significant risk factors. Risk factors include smoking, HTN, HLD, tobacco abuse and fairly strong family history of early cardiac disease.  -Cardiac enzymes negative x3, serial EKGs without acute changes and Echo with EF 60% mild LVH and grade 1 diastolic dysfunction.No wall abnormalities -TSH within the limits of normal,  lipid panel with triglycerides 182 otherwise unremarkable and hemoglobin A1c 5.9. -Evaluated by cardiology who opined mixed characteristics for cardiac chest pain and TIMI score 3. Stress test on 07/13/13 no evidence of cardiac etiology of her chest pain by echo or Lexiscan per Dr Harl Bowie. No further workup indicated.  -Follow up with PCP in 1-2 weeks for evaluation of symptoms. -discharge on PPI and advised to quit smoking.  COPD (chronic obstructive pulmonary disease): remained stable at baseline. Continue home medication regimen  HYPERTENSION: Home medications include amlodipine and lisinopril. Continue at discharge -patient advised to follow heart healthy diet   ANXIETY DEPRESSION: Remained stable at baseline. Continue home medication regimen  TOBACCO ABUSE: Smoking cessation counseling provided.   -patient contemplating quitting  ARTHRITIS: stable and at baseline. -continue PRN pain meds  Hyperlipidemia: Lipid panel as above. Continue statin.   Procedures:  Echo 07/13/13 with EF 60% and grade 1 diastolic dysfuntion  Lexiscan 07/13/13 negative  Consultations:  Dr Harl Bowie (cardiology)  Discharge Exam: Filed Vitals:   07/13/13 1400  BP: 114/61  Pulse:   Temp:  Resp: 18    General: well nourished, NAD, denies CP or SOB Cardiovascular: RRR No MGR No LE edema Respiratory: normal effort BS,  clear bilaterally, no wheezes/rhonchi  Discharge Instructions You were cared for by a hospitalist during your hospital stay. If you have any questions about your discharge medications or the care you received while you were in the hospital after you are discharged, you can call the unit and asked to speak with the hospitalist on call if the hospitalist that took care of you is not available. Once you are discharged, your primary care physician will handle any further medical issues. Please note that NO REFILLS for any discharge medications will be authorized once you are discharged, as it is imperative that you return to your primary care physician (or establish a relationship with a primary care physician if you do not have one) for your aftercare needs so that they can reassess your need for medications and monitor your lab values.     Medication List         albuterol 108 (90 BASE) MCG/ACT inhaler  Commonly known as:  PROVENTIL HFA;VENTOLIN HFA  Inhale 2 puffs into the lungs every 6 (six) hours as needed for wheezing.     amLODipine 5 MG tablet  Commonly known as:  NORVASC  TAKE 1 TABLET EVERY DAY     aspirin EC 81 MG tablet  Take 81 mg by mouth daily.     atorvastatin 40 MG tablet  Commonly known as:  LIPITOR  TAKE 1 TABLET AT BEDTIME     budesonide-formoterol 160-4.5 MCG/ACT inhaler  Commonly known as:  SYMBICORT  Inhale 2 puffs into the lungs 2 (two) times daily.     citalopram 20 MG tablet  Commonly known as:  CELEXA  Take 1 tablet (20 mg total) by mouth daily.     clonazePAM 0.5 MG tablet  Commonly known as:  KLONOPIN  TAKE 1 TABLET BY MOUTH TWICE DAILY     DULoxetine 30 MG capsule  Commonly known as:  CYMBALTA  Take 1 capsule (30 mg total) by mouth daily.     fish oil-omega-3 fatty acids 1000 MG capsule  Take 1 g by mouth daily.     lisinopril 20 MG tablet  Commonly known as:  PRINIVIL,ZESTRIL  TAKE 1 TABLET EVERY DAY     loratadine 10 MG tablet  Commonly  known as:  CLARITIN  Take 1 tablet (10 mg total) by mouth daily.     nicotine 14 mg/24hr patch  Commonly known as:  NICODERM CQ - dosed in mg/24 hours  Place 1 patch (14 mg total) onto the skin daily.     oxyCODONE 15 MG immediate release tablet  Commonly known as:  ROXICODONE  Take 15 mg by mouth every 4 (four) hours as needed for pain.     pantoprazole 40 MG tablet  Commonly known as:  PROTONIX  TAKE 1 TABLET AT 12 NOON DAILY       Allergies  Allergen Reactions  . Lorcet 10-650 [Hydrocodone-Acetaminophen] Itching     The results of significant diagnostics from this hospitalization (including imaging, microbiology, ancillary and laboratory) are listed below for reference.    Significant Diagnostic Studies: Nm Myocar Single W/spect W/wall Motion And Ef  07/13/2013   CLINICAL DATA:  58 year old female with no known history of coronary artery disease referred for chest pain.  EXAM: MYOCARDIAL IMAGING WITH SPECT (REST AND PHARMACOLOGIC-STRESS)  GATED LEFT VENTRICULAR WALL MOTION STUDY  LEFT VENTRICULAR  EJECTION FRACTION  TECHNIQUE: Standard myocardial SPECT imaging was performed after resting intravenous injection of 10 mCi Tc-64m sestamibi. Subsequently, intravenous infusion of Lexiscan was performed under the supervision of the Cardiology staff. At peak effect of the drug, 30 mCi Tc-37m sestamibi was injected intravenously and standard myocardial SPECT imaging was performed. Quantitative gated imaging was also performed to evaluate left ventricular wall motion, and estimate left ventricular ejection fraction.  COMPARISON:  None.  FINDINGS: Pharmacological stress  Baseline EKG showed normal sinus rhythm with possible anteroseptal Q-waves. After injection heart rate increased from 66 beats per min up to 105 beats per min and blood pressure decreased from 139/91 down to 114/79. The test was stopped after injection was completed, the patient did not experience any chest pain.  Post-injection  EKG showed no specific ischemic changes and no significant arrhythmias.  Myocardial perfusion imaging  Raw images showed appropriate radiotracer uptake. There was a small defect noted in the resting images in the apex, this defect was less intense in the post-injection images. The apex had normal wall motion. Overall findings consistent with apical thinning. There were no other myocardial perfusion defects. Gated imaging showed end-diastolic volume 83 mL, and systolic volume 33 mL, left ventricular ejection fraction 60%, t.i.d. 1.13. There was normal wall motion.  IMPRESSION: 1.  Negative Lexiscan myocardial perfusion imaging for ischemia  2. Normal left ventricular systolic function, left ventricular ejection fraction 60%.  3.  Low risk study for major cardiac events   Electronically Signed   By: Carlyle Dolly   On: 07/13/2013 13:02   Dg Chest Portable 1 View  07/12/2013   CLINICAL DATA:  The right-sided PICC an arm and jaw pain  EXAM: PORTABLE CHEST - 1 VIEW  COMPARISON:  DG CHEST 2V dated 02/26/2013  FINDINGS: The lungs are adequately inflated and clear. The cardiac silhouette is normal in size. The pulmonary vascularity is not engorged. There is no pleural effusion or pneumothorax. The mediastinum is normal in width. The observed portions of the bony thorax appear normal.  IMPRESSION: There is no evidence of CHF nor pneumonia nor other acute cardiopulmonary abnormality.   Electronically Signed   By: David  Martinique   On: 07/12/2013 12:37    Microbiology: Recent Results (from the past 240 hour(s))  MRSA PCR SCREENING     Status: None   Collection Time    07/12/13  4:30 PM      Result Value Ref Range Status   MRSA by PCR NEGATIVE  NEGATIVE Final   Comment:            The GeneXpert MRSA Assay (FDA     approved for NASAL specimens     only), is one component of a     comprehensive MRSA colonization     surveillance program. It is not     intended to diagnose MRSA     infection nor to guide or      monitor treatment for     MRSA infections.     Labs: Basic Metabolic Panel:  Recent Labs Lab 07/12/13 1225 07/13/13 0243  NA 143 140  K 3.7 3.8  CL 104 103  CO2 30 29  GLUCOSE 99 118*  BUN 19 21  CREATININE 0.74 0.71  CALCIUM 9.2 8.7   Liver Function Tests:  Recent Labs Lab 07/12/13 1225  AST 14  ALT 11  ALKPHOS 79  BILITOT 0.2*  PROT 6.4  ALBUMIN 3.4*   CBC:  Recent Labs Lab 07/12/13 1225  07/13/13 0243  WBC 9.6 9.5  NEUTROABS 6.5  --   HGB 13.7 12.5  HCT 41.5 38.3  MCV 93.5 93.6  PLT 262 238   Cardiac Enzymes:  Recent Labs Lab 07/12/13 1225 07/12/13 1447 07/12/13 1823 07/12/13 2034 07/13/13 0243  TROPONINI <0.30 <0.30 <0.30 <0.30 <0.30    Signed:  Barton Dubois  [facility] 07/13/2013, 2:25 PM

## 2013-07-13 NOTE — Progress Notes (Signed)
Pt escorted to awaiting transportation via wheelchair

## 2013-07-13 NOTE — Progress Notes (Signed)
ANTICOAGULATION CONSULT NOTE - follow up  Pharmacy Consult for Heparin Indication: chest pain/ACS  Allergies  Allergen Reactions  . Lorcet 10-650 [Hydrocodone-Acetaminophen] Itching   Patient Measurements: Height: 5\' 5"  (165.1 cm) Weight: 147 lb 11.3 oz (67 kg) IBW/kg (Calculated) : 57  Vital Signs: Temp: 98.4 F (36.9 C) (05/13 0755) Temp src: Oral (05/13 0755) BP: 113/70 mmHg (05/13 0700)  Labs:  Recent Labs  07/12/13 1225 07/12/13 1447 07/12/13 1823 07/12/13 2034 07/13/13 0243  HGB 13.7  --   --   --  12.5  HCT 41.5  --   --   --  38.3  PLT 262  --   --   --  238  APTT  --  28  --   --   --   LABPROT  --  12.0  --   --   --   INR  --  0.90  --   --   --   HEPARINUNFRC  --   --   --  0.24*  --   CREATININE 0.74  --   --   --  0.71  TROPONINI <0.30 <0.30 <0.30 <0.30 <0.30   Estimated Creatinine Clearance: 67.3 ml/min (by C-G formula based on Cr of 0.71).  Medical History: Past Medical History  Diagnosis Date  . COPD (chronic obstructive pulmonary disease)   . Hypertension   . Cancer   . Hypercholesteremia   . Depression   . Arthritis   . Tobacco user   . Perimenopausal   . Coronary artery disease     minimal nonobstructive   . Panic attacks    Medications:  Infusions:  . heparin 1,000 Units/hr (07/13/13 0238)   Assessment: 58yo female admitted with c/o chest pain.  Asked to initiate IV Heparin for ACS.  Heparin level was low when checked last night and heparin rate increased to 1000 units/hr per RN.    Goal of Therapy:  Heparin level 0.3-0.7 units/ml Monitor platelets by anticoagulation protocol: Yes   Plan:  Heparin infusion increased to 1000 units/hr Heparin level in 6-8 hours then daily CBC daily while on Heparin  Kebra Lowrimore A Rockie Vawter 07/13/2013,7:56 AM

## 2013-07-13 NOTE — Progress Notes (Signed)
Pt arranged transportation via step daughter & awaiting her arrival. Pt IV& ICU monitoring discontinued. Pt dressed in street clothes

## 2013-07-13 NOTE — Progress Notes (Signed)
No evidence of cardiac etiology of her chest pain by echo or Lexiscan. No further workup planned at this time, heparin drip has been discontinued. Appears to be non-cardiac chest pain. Will signoff of inpatient care, at this time patient does not require regular cardiology outpatient follow up. Please call with questions.   Zandra Abts MD

## 2013-07-13 NOTE — Progress Notes (Signed)
Pt given & discussed discharge instructions. Follow up appt with Dr Laurance Flatten completed by pt for 5/20/20016. Pt will have to wait until son or husband is available after work for transportation home

## 2013-07-20 ENCOUNTER — Encounter: Payer: Self-pay | Admitting: Nurse Practitioner

## 2013-07-20 ENCOUNTER — Other Ambulatory Visit: Payer: Self-pay | Admitting: Family Medicine

## 2013-07-20 ENCOUNTER — Ambulatory Visit (INDEPENDENT_AMBULATORY_CARE_PROVIDER_SITE_OTHER): Payer: Medicaid Other | Admitting: Nurse Practitioner

## 2013-07-20 VITALS — BP 127/81 | HR 94 | Temp 98.5°F | Ht 65.0 in | Wt 147.0 lb

## 2013-07-20 DIAGNOSIS — F411 Generalized anxiety disorder: Secondary | ICD-10-CM

## 2013-07-20 DIAGNOSIS — Z09 Encounter for follow-up examination after completed treatment for conditions other than malignant neoplasm: Secondary | ICD-10-CM

## 2013-07-20 DIAGNOSIS — T148XXA Other injury of unspecified body region, initial encounter: Secondary | ICD-10-CM

## 2013-07-20 NOTE — Patient Instructions (Signed)

## 2013-07-20 NOTE — Progress Notes (Signed)
   Subjective:    Patient ID: [name], female    DOB: 17-Oct-1955, 58 y.o.   MRN: [id number]  HPI Patient in today for hospital follow up- SHe was in hospital for right arm pain and shoulder pain that started all the sudden- Stayed over night but all test were negative- SHe feels ok today - Has a lot of bruising on bil arms- SHe says if she barely touches anything she bruises and skin scratches easily.    Review of Systems  Constitutional: Negative.   HENT: Negative.   Respiratory: Negative.   Cardiovascular: Negative.   Gastrointestinal: Negative.   Genitourinary: Negative.   Psychiatric/Behavioral: Negative.   All other systems reviewed and are negative.      Objective:   Physical Exam  Constitutional: She is oriented to person, place, and time. She appears well-developed and well-nourished.  Cardiovascular: Normal rate, regular rhythm and normal heart sounds.   Pulmonary/Chest: Effort normal and breath sounds normal.  Neurological: She is alert and oriented to person, place, and time.  Skin: Skin is warm and dry.  Multiple reddish blue bruising on bil forearms  Psychiatric: She has a normal mood and affect. Her behavior is normal. Judgment and thought content normal.   BP 127/81  Pulse 94  Temp(Src) 98.5 F (36.9 C) (Oral)  Ht 5' 5" (1.651 m)  Wt 147 lb (66.679 kg)  BMI 24.46 kg/m2        Assessment & Plan:  1. Hospital discharge follow-up Reviewed hospital records  2. Bruising Wait on lab results - Anemia Profile B - BMP8+EGFR - Thyroid Panel With TSH  3. GAD (generalized anxiety disorder) Stress management  Mary-Margaret Hassell Done, FNP'

## 2013-07-21 LAB — ANEMIA PROFILE B
BASOS: 0 %
Basophils Absolute: 0 10*3/uL (ref 0.0–0.2)
Eos: 1 %
Eosinophils Absolute: 0.1 10*3/uL (ref 0.0–0.4)
Ferritin: 29 ng/mL (ref 15–150)
Folate: 10.6 ng/mL (ref >3.0)
HEMATOCRIT: 41.3 % (ref 34.0–46.6)
HEMOGLOBIN: 14.1 g/dL (ref 11.1–15.9)
IRON: 124 ug/dL (ref 35–155)
Immature Grans (Abs): 0 10*3/uL (ref 0.0–0.1)
Immature Granulocytes: 0 %
Iron Saturation: 38 % (ref 15–55)
Lymphocytes Absolute: 2.4 10*3/uL (ref 0.7–3.1)
Lymphs: 27 %
MCH: 30.9 pg (ref 26.6–33.0)
MCHC: 34.1 g/dL (ref 31.5–35.7)
MCV: 90 fL (ref 79–97)
MONOCYTES: 6 %
Monocytes Absolute: 0.5 10*3/uL (ref 0.1–0.9)
NEUTROS ABS: 5.8 10*3/uL (ref 1.4–7.0)
Neutrophils Relative %: 66 %
Platelets: 298 10*3/uL (ref 150–379)
RBC: 4.57 x10E6/uL (ref 3.77–5.28)
RDW: 13.8 % (ref 12.3–15.4)
RETIC CT PCT: 0.8 % (ref 0.6–2.6)
TIBC: 330 ug/dL (ref 250–450)
UIBC: 206 ug/dL (ref 150–375)
VITAMIN B 12: 222 pg/mL (ref 211–946)
WBC: 8.9 10*3/uL (ref 3.4–10.8)

## 2013-07-21 LAB — BMP8+EGFR
BUN/Creatinine Ratio: 25 (ref 11–26)
BUN: 19 mg/dL (ref 8–27)
CALCIUM: 9.3 mg/dL (ref 8.7–10.3)
CO2: 24 mmol/L (ref 18–29)
CREATININE: 0.76 mg/dL (ref 0.57–1.00)
Chloride: 106 mmol/L (ref 97–108)
GFR calc Af Amer: 99 mL/min/{1.73_m2} (ref >59)
GFR, EST NON AFRICAN AMERICAN: 86 mL/min/{1.73_m2} (ref >59)
GLUCOSE: 94 mg/dL (ref 65–99)
POTASSIUM: 3.8 mmol/L (ref 3.5–5.2)
Sodium: 145 mmol/L — ABNORMAL HIGH (ref 134–144)

## 2013-07-21 LAB — THYROID PANEL WITH TSH
Free Thyroxine Index: 1.9 (ref 1.2–4.9)
T3 UPTAKE RATIO: 26 % (ref 24–39)
T4, Total: 7.3 ug/dL (ref 4.5–12.0)
TSH: 0.729 u[IU]/mL (ref 0.450–4.500)

## 2013-07-21 NOTE — Care Management Utilization Note (Signed)
UR completed 

## 2013-07-22 ENCOUNTER — Other Ambulatory Visit: Payer: Self-pay

## 2013-07-22 MED ORDER — CLONAZEPAM 0.5 MG PO TABS: ORAL_TABLET | ORAL | Status: DC

## 2013-07-22 NOTE — Telephone Encounter (Signed)
Please call in klonopin with 1 refills 

## 2013-07-22 NOTE — Telephone Encounter (Signed)
Called in.

## 2013-07-22 NOTE — Telephone Encounter (Signed)
Last seen 07/20/13  MMM If approved route to nurse to call into [facility]  [phone]

## 2013-08-16 ENCOUNTER — Other Ambulatory Visit: Payer: Self-pay | Admitting: Nurse Practitioner

## 2013-09-15 ENCOUNTER — Other Ambulatory Visit: Payer: Self-pay | Admitting: Nurse Practitioner

## 2013-09-16 ENCOUNTER — Other Ambulatory Visit: Payer: Self-pay | Admitting: *Deleted

## 2013-09-16 MED ORDER — CLONAZEPAM 0.5 MG PO TABS: ORAL_TABLET | ORAL | Status: DC

## 2013-09-16 NOTE — Telephone Encounter (Signed)
Please call in klonopin with 1 refills 

## 2013-09-16 NOTE — Telephone Encounter (Signed)
Patient last seen in office on 07-20-13. Rx last filled on 08-20-13 for #60. Please advise. If approved please route to Pool B so nurse can call in to [facility] at [phone]

## 2013-09-16 NOTE — Telephone Encounter (Signed)
Phoned in.

## 2013-11-14 ENCOUNTER — Other Ambulatory Visit: Payer: Self-pay | Admitting: Nurse Practitioner

## 2013-11-14 ENCOUNTER — Other Ambulatory Visit: Payer: Self-pay | Admitting: Family Medicine

## 2013-11-18 ENCOUNTER — Other Ambulatory Visit: Payer: Self-pay | Admitting: *Deleted

## 2013-11-18 NOTE — Telephone Encounter (Signed)
Patient last seen in office on 07/20/13. Rx last filled on 10-19-13 for #60. Please advise.  If approved please route to pool B so nurse can phone in to pharmacy

## 2013-11-20 MED ORDER — CLONAZEPAM 0.5 MG PO TABS: ORAL_TABLET | ORAL | Status: DC

## 2013-11-20 NOTE — Telephone Encounter (Signed)
Please call in klonopin with 1 refills 

## 2013-11-21 ENCOUNTER — Other Ambulatory Visit: Payer: Self-pay | Admitting: Nurse Practitioner

## 2013-11-21 NOTE — Telephone Encounter (Signed)
Left authorization on voicemail 

## 2014-01-13 ENCOUNTER — Other Ambulatory Visit: Payer: Self-pay | Admitting: Nurse Practitioner

## 2014-01-16 ENCOUNTER — Other Ambulatory Visit: Payer: Self-pay | Admitting: *Deleted

## 2014-01-16 MED ORDER — CLONAZEPAM 0.5 MG PO TABS: ORAL_TABLET | ORAL | Status: DC

## 2014-01-16 NOTE — Telephone Encounter (Signed)
Please call in klonopin with 0 refills no more refills without being seen  

## 2014-01-16 NOTE — Telephone Encounter (Signed)
Last filled 12/18/13, last seen 07/20/13, call into [facility]

## 2014-01-17 ENCOUNTER — Other Ambulatory Visit: Payer: Self-pay | Admitting: *Deleted

## 2014-01-17 ENCOUNTER — Other Ambulatory Visit: Payer: Self-pay | Admitting: Nurse Practitioner

## 2014-01-17 NOTE — Telephone Encounter (Signed)
Script for klonipin called to [facility].  Must have office visit for further refills.

## 2014-02-10 ENCOUNTER — Telehealth: Payer: Self-pay | Admitting: Nurse Practitioner

## 2014-02-10 ENCOUNTER — Encounter: Payer: Self-pay | Admitting: *Deleted

## 2014-02-10 MED ORDER — CLONAZEPAM 0.5 MG PO TABS: ORAL_TABLET | ORAL | Status: DC

## 2014-02-10 NOTE — Telephone Encounter (Signed)
Pt aware, rx called in  

## 2014-02-10 NOTE — Telephone Encounter (Signed)
Please call in klonopin 0.5mg 1 po BID #60  with 0 refills 

## 2014-02-10 NOTE — Telephone Encounter (Signed)
Pt requesting refill to get her through until her appt, if approved will call into [facility].

## 2014-02-13 ENCOUNTER — Other Ambulatory Visit: Payer: Self-pay | Admitting: Nurse Practitioner

## 2014-02-14 ENCOUNTER — Ambulatory Visit: Payer: Medicaid Other

## 2014-02-15 ENCOUNTER — Telehealth: Payer: Self-pay

## 2014-02-15 DIAGNOSIS — N200 Calculus of kidney: Secondary | ICD-10-CM

## 2014-02-15 NOTE — Telephone Encounter (Signed)
Referral made to urology 

## 2014-02-15 NOTE — Telephone Encounter (Signed)
Needs a referral to Dr Exie Parody in [place] for kidney stones

## 2014-02-15 NOTE — Telephone Encounter (Signed)
Aware,referral made.

## 2014-03-07 ENCOUNTER — Telehealth: Payer: Self-pay | Admitting: Nurse Practitioner

## 2014-03-14 ENCOUNTER — Other Ambulatory Visit: Payer: Self-pay | Admitting: Nurse Practitioner

## 2014-03-14 NOTE — Telephone Encounter (Signed)
Last filled 02/14/14, has appt 03/28/14. Call into [facility] [phone]

## 2014-03-14 NOTE — Telephone Encounter (Signed)
Please call in klonopin with 0 refills 

## 2014-03-17 ENCOUNTER — Other Ambulatory Visit: Payer: Self-pay | Admitting: Nurse Practitioner

## 2014-03-28 ENCOUNTER — Other Ambulatory Visit: Payer: Medicaid Other | Admitting: Nurse Practitioner

## 2014-04-13 ENCOUNTER — Other Ambulatory Visit: Payer: Self-pay | Admitting: Nurse Practitioner

## 2014-04-13 ENCOUNTER — Other Ambulatory Visit: Payer: Self-pay | Admitting: *Deleted

## 2014-04-13 NOTE — Telephone Encounter (Signed)
Last seen 07/20/13 MMM  Upcoming visit 04/18/14  If approved route to nurse to call into [facility]  [phone]

## 2014-04-13 NOTE — Telephone Encounter (Signed)
Klonipin refills called to vm of pharmacy.

## 2014-04-13 NOTE — Telephone Encounter (Signed)
Please call in klonopin with 1 refills 

## 2014-04-18 ENCOUNTER — Other Ambulatory Visit: Payer: Medicaid Other | Admitting: Family Medicine

## 2014-05-08 ENCOUNTER — Encounter: Payer: Self-pay | Admitting: Family Medicine

## 2014-05-08 ENCOUNTER — Ambulatory Visit (INDEPENDENT_AMBULATORY_CARE_PROVIDER_SITE_OTHER): Payer: Medicaid Other | Admitting: Family Medicine

## 2014-05-08 VITALS — BP 104/62 | HR 77 | Temp 98.0°F | Ht 65.0 in | Wt 132.2 lb

## 2014-05-08 DIAGNOSIS — G8929 Other chronic pain: Secondary | ICD-10-CM

## 2014-05-08 DIAGNOSIS — I10 Essential (primary) hypertension: Secondary | ICD-10-CM | POA: Diagnosis not present

## 2014-05-08 DIAGNOSIS — F411 Generalized anxiety disorder: Secondary | ICD-10-CM | POA: Diagnosis not present

## 2014-05-08 DIAGNOSIS — J439 Emphysema, unspecified: Secondary | ICD-10-CM

## 2014-05-08 DIAGNOSIS — N200 Calculus of kidney: Secondary | ICD-10-CM

## 2014-05-08 DIAGNOSIS — F329 Major depressive disorder, single episode, unspecified: Secondary | ICD-10-CM | POA: Diagnosis not present

## 2014-05-08 DIAGNOSIS — Z1212 Encounter for screening for malignant neoplasm of rectum: Secondary | ICD-10-CM | POA: Diagnosis not present

## 2014-05-08 DIAGNOSIS — E559 Vitamin D deficiency, unspecified: Secondary | ICD-10-CM | POA: Diagnosis not present

## 2014-05-08 DIAGNOSIS — M1990 Unspecified osteoarthritis, unspecified site: Secondary | ICD-10-CM | POA: Diagnosis not present

## 2014-05-08 DIAGNOSIS — M549 Dorsalgia, unspecified: Secondary | ICD-10-CM

## 2014-05-08 DIAGNOSIS — E785 Hyperlipidemia, unspecified: Secondary | ICD-10-CM

## 2014-05-08 DIAGNOSIS — Z0000 Encounter for general adult medical examination without abnormal findings: Secondary | ICD-10-CM

## 2014-05-08 DIAGNOSIS — F32A Depression, unspecified: Secondary | ICD-10-CM

## 2014-05-08 LAB — POCT CBC
Granulocyte percent: 62.2 %G (ref 37–80)
HCT, POC: 44.9 % (ref 37.7–47.9)
Hemoglobin: 14.1 g/dL (ref 12.2–16.2)
LYMPH, POC: 3 (ref 0.6–3.4)
MCH: 28.9 pg (ref 27–31.2)
MCHC: 31.3 g/dL — AB (ref 31.8–35.4)
MCV: 92.3 fL (ref 80–97)
MPV: 7 fL (ref 0–99.8)
PLATELET COUNT, POC: 335 10*3/uL (ref 142–424)
POC Granulocyte: 5.7 (ref 2–6.9)
POC LYMPH %: 33 % (ref 10–50)
RBC: 4.87 M/uL (ref 4.04–5.48)
RDW, POC: 13.9 %
WBC: 9.2 10*3/uL (ref 4.6–10.2)

## 2014-05-08 LAB — POCT URINALYSIS DIPSTICK
BILIRUBIN UA: NEGATIVE
Blood, UA: NEGATIVE
Glucose, UA: NEGATIVE
Ketones, UA: NEGATIVE
LEUKOCYTES UA: NEGATIVE
Nitrite, UA: NEGATIVE
PROTEIN UA: NEGATIVE
Spec Grav, UA: 1.02
UROBILINOGEN UA: NEGATIVE
pH, UA: 5

## 2014-05-08 MED ORDER — LISINOPRIL 20 MG PO TABS: 20.0000 mg | ORAL_TABLET | Freq: Every day | ORAL | Status: DC

## 2014-05-08 MED ORDER — DULOXETINE HCL 60 MG PO CPEP: 60.0000 mg | ORAL_CAPSULE | Freq: Every day | ORAL | Status: DC

## 2014-05-08 NOTE — Progress Notes (Signed)
Subjective:  Patient ID: [name], female    DOB: 22-Jan-1956  Age: 59 y.o. MRN: [id number]  CC: Annual Exam   HPI [name] presents for annual physical. Decreased appetite. Feeling depressed. Son driving me crazy.On drugs.  Caring for grandkids until [employer] took them a month ago.Dysphoric, sad withdrawn. Tearful. She has a great deal of angst and agitation with a history of anxiety complicating her depression.   follow-up of hypertension. Patient has no history of headache chest pain or shortness of breath or recent cough. Patient also denies symptoms of TIA such as numbness weakness lateralizing. Patient checks  blood pressure at home and has not had any elevated readings recently. Patient denies side effects from his medication. States taking it regularly.  Patient with history of shortness of breath and COPD. Currently stable. Using albuterol on a weekly basis. The Symbicort keeps her under control so that she has few episodes  Patient has had a Pap smear 2 years ago that is normal. Patient has had a partial hysterectomy several years before that.  History [name] has a past medical history of COPD (chronic obstructive pulmonary disease); Hypertension; Cancer; Hypercholesteremia; Depression; Arthritis; Tobacco user; Perimenopausal; Coronary artery disease; and Panic attacks.   She has past surgical history that includes Abdominal hysterectomy; vocal cord; and Right foot toe surgery.   Her family history includes Heart attack in her brother, father, and mother; Heart disease in her mother.She reports that she has been smoking Cigarettes.  She started smoking about 45 years ago. She has been smoking about 1.50 packs per day. She does not have any smokeless tobacco history on file. She reports that she does not drink alcohol or use illicit drugs.  Current Outpatient Prescriptions on File Prior to Visit  Medication Sig Dispense Refill  . albuterol (PROVENTIL HFA;VENTOLIN HFA) 108 (90  BASE) MCG/ACT inhaler Inhale 2 puffs into the lungs every 6 (six) hours as needed for wheezing. 1 Inhaler 0  . amLODipine (NORVASC) 5 MG tablet TAKE 1 TABLET BY MOUTH EVERY DAY 30 tablet 0  . aspirin EC 81 MG tablet Take 81 mg by mouth daily.    Marland Kitchen atorvastatin (LIPITOR) 40 MG tablet TAKE 1 TABLET BY MOUTH AT BEDTIME 30 tablet 0  . clonazePAM (KLONOPIN) 0.5 MG tablet TAKE 1 TABLET BY MOUTH TWICE DAILY 60 tablet 0  . fish oil-omega-3 fatty acids 1000 MG capsule Take 1 g by mouth daily.     Marland Kitchen loratadine (CLARITIN) 10 MG tablet TAKE 1 TABLET EVERY DAY 30 tablet 0  . oxyCODONE (ROXICODONE) 15 MG immediate release tablet Take 15 mg by mouth every 4 (four) hours as needed for pain.    . pantoprazole (PROTONIX) 40 MG tablet TAKE 1 TABLET BY MOUTH AT 12 NOON DAILY 30 tablet 0  . budesonide-formoterol (SYMBICORT) 160-4.5 MCG/ACT inhaler Inhale 2 puffs into the lungs 2 (two) times daily. 3 Inhaler 1   No current facility-administered medications on file prior to visit.    ROS Review of Systems  Constitutional: Negative for fever, chills, diaphoresis, appetite change, fatigue and unexpected weight change.  HENT: Negative for congestion, ear pain, hearing loss, postnasal drip, rhinorrhea, sneezing, sore throat and trouble swallowing.   Eyes: Negative for pain.  Respiratory: Negative for cough, chest tightness and shortness of breath.   Cardiovascular: Negative for chest pain and palpitations.  Gastrointestinal: Negative for nausea, vomiting, abdominal pain, diarrhea and constipation.  Genitourinary: Negative for dysuria, frequency and menstrual problem.  Musculoskeletal: Negative for  joint swelling and arthralgias.  Skin: Negative for rash.  Neurological: Negative for dizziness, weakness, numbness and headaches.  Psychiatric/Behavioral: Positive for dysphoric mood, decreased concentration and agitation. Negative for suicidal ideas and hallucinations. The patient is nervous/anxious.     Objective:    BP 104/62 mmHg  Pulse 77  Temp(Src) 98 F (36.7 C) (Oral)  Ht _0  (1.651 m)  Wt 132 lb 3.2 oz (59.966 kg)  BMI 22.00 kg/m2  BP Readings from Last 3 Encounters:  05/08/14 104/62  07/20/13 127/81  07/13/13 114/61    Wt Readings from Last 3 Encounters:  05/08/14 132 lb 3.2 oz (59.966 kg)  07/20/13 147 lb (66.679 kg)  07/13/13 147 lb 11.3 oz (67 kg)     Physical Exam  Constitutional: She is oriented to person, place, and time. She appears well-developed and well-nourished. No distress.  HENT:  Head: Normocephalic and atraumatic.  Right Ear: External ear normal.  Left Ear: External ear normal.  Nose: Nose normal.  Mouth/Throat: Oropharynx is clear and moist.  Eyes: Conjunctivae and EOM are normal. Pupils are equal, round, and reactive to light.  Neck: Normal range of motion. Neck supple. No thyromegaly present.  Cardiovascular: Normal rate, regular rhythm and normal heart sounds.   No murmur heard. Pulmonary/Chest: Effort normal and breath sounds normal. No respiratory distress. She has no wheezes. She has no rales.  Abdominal: Soft. Bowel sounds are normal. She exhibits no distension. There is no tenderness.  Genitourinary: No breast swelling, tenderness, discharge or bleeding (no masses).  Lymphadenopathy:    She has no cervical adenopathy.  Neurological: She is alert and oriented to person, place, and time. She has normal reflexes.  Skin: Skin is warm and dry.  Psychiatric: She has a normal mood and affect. Her behavior is normal. Judgment and thought content normal.    Lab Results  Component Value Date   HGBA1C 5.9* 07/12/2013    Lab Results  Component Value Date   WBC 9.2 05/08/2014   HGB 14.1 05/08/2014   HCT 44.9 05/08/2014   PLT 298 07/20/2013   GLUCOSE 94 07/20/2013   CHOL 148 07/13/2013   TRIG 182* 07/13/2013   HDL 45 07/13/2013   LDLCALC 67 07/13/2013   ALT 11 07/12/2013   AST 14 07/12/2013   NA 145* 07/20/2013   K 3.8 07/20/2013   CL 106  07/20/2013   CREATININE 0.76 07/20/2013   BUN 19 07/20/2013   CO2 24 07/20/2013   TSH 0.729 07/20/2013   INR 0.90 07/12/2013   HGBA1C 5.9* 07/12/2013    Nm Myocar Single W/spect W/wall Motion And Ef  07/13/2013   CLINICAL DATA:  59 year old female with no known history of coronary artery disease referred for chest pain.  EXAM: MYOCARDIAL IMAGING WITH SPECT (REST AND PHARMACOLOGIC-STRESS)  GATED LEFT VENTRICULAR WALL MOTION STUDY  LEFT VENTRICULAR EJECTION FRACTION  TECHNIQUE: Standard myocardial SPECT imaging was performed after resting intravenous injection of 10 mCi Tc-47msestamibi. Subsequently, intravenous infusion of Lexiscan was performed under the supervision of the Cardiology staff. At peak effect of the drug, 30 mCi Tc-941mestamibi was injected intravenously and standard myocardial SPECT imaging was performed. Quantitative gated imaging was also performed to evaluate left ventricular wall motion, and estimate left ventricular ejection fraction.  COMPARISON:  None.  FINDINGS: Pharmacological stress  Baseline EKG showed normal sinus rhythm with possible anteroseptal Q-waves. After injection heart rate increased from 66 beats per min up to 105 beats per min and blood pressure decreased from 139/91  down to 114/79. The test was stopped after injection was completed, the patient did not experience any chest pain.  Post-injection EKG showed no specific ischemic changes and no significant arrhythmias.  Myocardial perfusion imaging  Raw images showed appropriate radiotracer uptake. There was a small defect noted in the resting images in the apex, this defect was less intense in the post-injection images. The apex had normal wall motion. Overall findings consistent with apical thinning. There were no other myocardial perfusion defects. Gated imaging showed end-diastolic volume 83 mL, and systolic volume 33 mL, left ventricular ejection fraction 60%, t.i.d. 1.13. There was normal wall motion.  IMPRESSION:  1.  Negative Lexiscan myocardial perfusion imaging for ischemia  2. Normal left ventricular systolic function, left ventricular ejection fraction 60%.  3.  Low risk study for major cardiac events   Electronically Signed   By: Carlyle Dolly   On: 07/13/2013 13:02   Dg Chest Portable 1 View  07/12/2013   CLINICAL DATA:  The right-sided PICC an arm and jaw pain  EXAM: PORTABLE CHEST - 1 VIEW  COMPARISON:  DG CHEST 2V dated 02/26/2013  FINDINGS: The lungs are adequately inflated and clear. The cardiac silhouette is normal in size. The pulmonary vascularity is not engorged. There is no pleural effusion or pneumothorax. The mediastinum is normal in width. The observed portions of the bony thorax appear normal.  IMPRESSION: There is no evidence of CHF nor pneumonia nor other acute cardiopulmonary abnormality.   Electronically Signed   By: David  Martinique   On: 07/12/2013 12:37    Assessment & Plan:   Nelva was seen today for annual exam.  Diagnoses and all orders for this visit:  Pulmonary emphysema, unspecified emphysema type Orders: -     POCT CBC -     CMP14+EGFR -     PR BREATHING CAPACITY TEST  Routine physical examination Orders: -     HM COLONOSCOPY -     POCT CBC -     CMP14+EGFR  Depression Orders: -     DULoxetine (CYMBALTA) 60 MG capsule; Take 1 capsule (60 mg total) by mouth daily. -     POCT CBC -     CMP14+EGFR  GAD (generalized anxiety disorder) Orders: -     DULoxetine (CYMBALTA) 60 MG capsule; Take 1 capsule (60 mg total) by mouth daily. -     POCT CBC -     CMP14+EGFR  Essential hypertension Orders: -     lisinopril (PRINIVIL,ZESTRIL) 20 MG tablet; Take 1 tablet (20 mg total) by mouth daily. -     POCT CBC -     CMP14+EGFR  Hyperlipidemia Orders: -     POCT CBC -     CMP14+EGFR -     Lipid panel -     POCT urinalysis dipstick  Chronic back pain Orders: -     POCT CBC -     CMP14+EGFR  Osteoarthritis, unspecified osteoarthritis type, unspecified  site Orders: -     POCT CBC -     CMP14+EGFR  Kidney stones Orders: -     Ambulatory referral to Urology -     POCT CBC -     CMP14+EGFR  Vitamin D deficiency Orders: -     CMP14+EGFR -     Vit D  25 hydroxy (rtn osteoporosis monitoring)  Screening for malignant neoplasm of the rectum Orders: -     Fecal occult blood, imunochemical   I have discontinued Ms. Edgington's citalopram.  I have also changed her lisinopril and DULoxetine. Additionally, I am having her maintain her aspirin EC, fish oil-omega-3 fatty acids, budesonide-formoterol, albuterol, oxyCODONE, clonazePAM, pantoprazole, loratadine, atorvastatin, amLODipine, and fentaNYL.  Meds ordered this encounter  Medications  . fentaNYL (DURAGESIC - DOSED MCG/HR) 75 MCG/HR    Sig: Place 75 mcg onto the skin every 3 (three) days.  Marland Kitchen lisinopril (PRINIVIL,ZESTRIL) 20 MG tablet    Sig: Take 1 tablet (20 mg total) by mouth daily.    Dispense:  30 tablet    Refill:  0    Generic For:*PRINIVIL   20MG  . DULoxetine (CYMBALTA) 60 MG capsule    Sig: Take 1 capsule (60 mg total) by mouth daily.    Dispense:  30 capsule    Refill:  2    Generic HQR:[id number]    30MG   Pt. To continue with care of Pain clinic for Arthritis, back and kidney related pains.  Follow-up: Return in about 1 month (around 06/08/2014).  Claretta Fraise, M.D.

## 2014-05-09 ENCOUNTER — Other Ambulatory Visit: Payer: Self-pay | Admitting: Family Medicine

## 2014-05-09 LAB — CMP14+EGFR
ALBUMIN: 3.9 g/dL (ref 3.6–4.8)
ALT: 11 IU/L (ref 0–32)
AST: 14 IU/L (ref 0–40)
Albumin/Globulin Ratio: 2 (ref 1.1–2.5)
Alkaline Phosphatase: 85 IU/L (ref 39–117)
BUN / CREAT RATIO: 20 (ref 11–26)
BUN: 15 mg/dL (ref 8–27)
Bilirubin Total: 0.3 mg/dL (ref 0.0–1.2)
CHLORIDE: 104 mmol/L (ref 97–108)
CO2: 29 mmol/L (ref 18–29)
Calcium: 9.3 mg/dL (ref 8.7–10.3)
Creatinine, Ser: 0.74 mg/dL (ref 0.57–1.00)
GFR calc Af Amer: 102 mL/min/{1.73_m2} (ref >59)
GFR calc non Af Amer: 88 mL/min/{1.73_m2} (ref >59)
GLUCOSE: 75 mg/dL (ref 65–99)
Globulin, Total: 2 g/dL (ref 1.5–4.5)
POTASSIUM: 4.6 mmol/L (ref 3.5–5.2)
Sodium: 143 mmol/L (ref 134–144)
TOTAL PROTEIN: 5.9 g/dL — AB (ref 6.0–8.5)

## 2014-05-09 LAB — LIPID PANEL
CHOLESTEROL TOTAL: 162 mg/dL (ref 100–199)
Chol/HDL Ratio: 3.3 ratio units (ref 0.0–4.4)
HDL: 49 mg/dL (ref >39)
LDL Calculated: 94 mg/dL (ref 0–99)
Triglycerides: 96 mg/dL (ref 0–149)
VLDL Cholesterol Cal: 19 mg/dL (ref 5–40)

## 2014-05-09 LAB — VITAMIN D 25 HYDROXY (VIT D DEFICIENCY, FRACTURES): Vit D, 25-Hydroxy: 16.8 ng/mL — ABNORMAL LOW (ref 30.0–100.0)

## 2014-05-09 MED ORDER — VITAMIN D (ERGOCALCIFEROL) 1.25 MG (50000 UNIT) PO CAPS: 50000.0000 [IU] | ORAL_CAPSULE | ORAL | Status: DC

## 2014-05-10 LAB — FECAL OCCULT BLOOD, IMMUNOCHEMICAL: Fecal Occult Bld: NEGATIVE

## 2014-05-11 ENCOUNTER — Other Ambulatory Visit: Payer: Self-pay | Admitting: *Deleted

## 2014-05-11 DIAGNOSIS — Z1212 Encounter for screening for malignant neoplasm of rectum: Secondary | ICD-10-CM

## 2014-05-15 ENCOUNTER — Other Ambulatory Visit: Payer: Self-pay | Admitting: Nurse Practitioner

## 2014-06-02 ENCOUNTER — Encounter: Payer: Self-pay | Admitting: Family Medicine

## 2014-06-02 ENCOUNTER — Ambulatory Visit (INDEPENDENT_AMBULATORY_CARE_PROVIDER_SITE_OTHER): Payer: Medicaid Other | Admitting: Family Medicine

## 2014-06-02 VITALS — BP 113/73 | HR 78 | Temp 97.7°F | Ht 65.0 in | Wt 126.6 lb

## 2014-06-02 DIAGNOSIS — F411 Generalized anxiety disorder: Secondary | ICD-10-CM | POA: Diagnosis not present

## 2014-06-02 DIAGNOSIS — F329 Major depressive disorder, single episode, unspecified: Secondary | ICD-10-CM | POA: Diagnosis not present

## 2014-06-02 DIAGNOSIS — F32A Depression, unspecified: Secondary | ICD-10-CM

## 2014-06-02 DIAGNOSIS — Z23 Encounter for immunization: Secondary | ICD-10-CM

## 2014-06-02 MED ORDER — CLONAZEPAM 1 MG PO TABS: 1.0000 mg | ORAL_TABLET | Freq: Two times a day (BID) | ORAL | Status: DC

## 2014-06-02 MED ORDER — DULOXETINE HCL 30 MG PO CPEP: 90.0000 mg | ORAL_CAPSULE | Freq: Every day | ORAL | Status: DC

## 2014-06-02 NOTE — Progress Notes (Signed)
Subjective:  Patient ID: [name], female    DOB: 1955-12-22  Age: 59 y.o. MRN: [id number]  CC: GAD   HPI [name] presents for anxiety. Marital & family problems. Kids on drugs and alcohol. Has been covers for his son who is using cocaine. At the same time she does not like the patient's son who lives in a trailer just next door. Patient's husband, stepfather of the patient's son says that he spends too much time in their home. The circumstances led to the patient being dysphoric and despondent. She has what drawn from normal activities and not enjoying her usual activities. She has a sense of impending doom but does not have any attacks of chest pain and shortness of breath History [name] has a past medical history of COPD (chronic obstructive pulmonary disease); Hypertension; Cancer; Hypercholesteremia; Depression; Arthritis; Tobacco user; Perimenopausal; Coronary artery disease; and Panic attacks.   She has past surgical history that includes Abdominal hysterectomy; vocal cord; and Right foot toe surgery.   Her family history includes Heart attack in her brother, father, and mother; Heart disease in her mother.She reports that she has been smoking Cigarettes.  She started smoking about 45 years ago. She has been smoking about 1.50 packs per day. She does not have any smokeless tobacco history on file. She reports that she does not drink alcohol or use illicit drugs.  Current Outpatient Prescriptions on File Prior to Visit  Medication Sig Dispense Refill  . albuterol (PROVENTIL HFA;VENTOLIN HFA) 108 (90 BASE) MCG/ACT inhaler Inhale 2 puffs into the lungs every 6 (six) hours as needed for wheezing. 1 Inhaler 0  . amLODipine (NORVASC) 5 MG tablet TAKE 1 TABLET BY MOUTH EVERY DAY 30 tablet 5  . aspirin EC 81 MG tablet Take 81 mg by mouth daily.    Marland Kitchen atorvastatin (LIPITOR) 40 MG tablet TAKE 1 TABLET BY MOUTH AT BEDTIME 30 tablet 5  . budesonide-formoterol (SYMBICORT) 160-4.5 MCG/ACT  inhaler Inhale 2 puffs into the lungs 2 (two) times daily. 3 Inhaler 1  . fentaNYL (DURAGESIC - DOSED MCG/HR) 75 MCG/HR Place 75 mcg onto the skin every other day.     . fish oil-omega-3 fatty acids 1000 MG capsule Take 1 g by mouth daily.     Marland Kitchen lisinopril (PRINIVIL,ZESTRIL) 20 MG tablet Take 1 tablet (20 mg total) by mouth daily. 30 tablet 0  . loratadine (CLARITIN) 10 MG tablet TAKE 1 TABLET EVERY DAY 30 tablet 5  . pantoprazole (PROTONIX) 40 MG tablet TAKE 1 TABLET BY MOUTH AT 12 NOON DAILY 30 tablet 5  . Vitamin D, Ergocalciferol, (DRISDOL) 50000 UNITS CAPS capsule Take 1 capsule (50,000 Units total) by mouth 2 (two) times a week. 16 capsule 0   No current facility-administered medications on file prior to visit.    ROS Review of Systems  Constitutional: Negative for fever, chills, diaphoresis, appetite change, fatigue and unexpected weight change.  HENT: Negative for congestion, ear pain, hearing loss, postnasal drip, rhinorrhea, sneezing, sore throat and trouble swallowing.   Eyes: Negative for pain.  Respiratory: Negative for cough, chest tightness and shortness of breath.   Cardiovascular: Negative for chest pain and palpitations.  Gastrointestinal: Negative for nausea, vomiting, abdominal pain, diarrhea and constipation.  Genitourinary: Negative for dysuria, frequency and menstrual problem.  Musculoskeletal: Negative for joint swelling and arthralgias.  Skin: Negative for rash.  Neurological: Negative for dizziness, weakness, numbness and headaches.  Psychiatric/Behavioral: Negative for dysphoric mood and agitation.    Objective:  BP 113/73 mmHg  Pulse 78  Temp(Src) 97.7 F (36.5 C) (Oral)  Ht 5\' 5"  (1.651 m)  Wt 126 lb 9.6 oz (57.425 kg)  BMI 21.07 kg/m2  BP Readings from Last 3 Encounters:  06/02/14 113/73  05/08/14 104/62  07/20/13 127/81    Wt Readings from Last 3 Encounters:  06/02/14 126 lb 9.6 oz (57.425 kg)  05/08/14 132 lb 3.2 oz (59.966 kg)  07/20/13 147  lb (66.679 kg)     Physical Exam  Constitutional: She is oriented to person, place, and time. She appears well-developed and well-nourished. No distress.  HENT:  Head: Normocephalic and atraumatic.  Right Ear: External ear normal.  Left Ear: External ear normal.  Nose: Nose normal.  Mouth/Throat: Oropharynx is clear and moist.  Eyes: Conjunctivae and EOM are normal. Pupils are equal, round, and reactive to light.  Neck: Normal range of motion. Neck supple. No thyromegaly present.  Cardiovascular: Normal rate, regular rhythm and normal heart sounds.   No murmur heard. Pulmonary/Chest: Effort normal and breath sounds normal. No respiratory distress. She has no wheezes. She has no rales.  Abdominal: Soft. Bowel sounds are normal. She exhibits no distension. There is no tenderness.  Lymphadenopathy:    She has no cervical adenopathy.  Neurological: She is alert and oriented to person, place, and time. She has normal reflexes.  Skin: Skin is warm and dry.    Lab Results  Component Value Date   HGBA1C 5.9* 07/12/2013    Lab Results  Component Value Date   WBC 9.2 05/08/2014   HGB 14.1 05/08/2014   HCT 44.9 05/08/2014   PLT 298 07/20/2013   GLUCOSE 75 05/08/2014   CHOL 162 05/08/2014   TRIG 96 05/08/2014   HDL 49 05/08/2014   LDLCALC 94 05/08/2014   ALT 11 05/08/2014   AST 14 05/08/2014   NA 143 05/08/2014   K 4.6 05/08/2014   CL 104 05/08/2014   CREATININE 0.74 05/08/2014   BUN 15 05/08/2014   CO2 29 05/08/2014   TSH 0.729 07/20/2013   INR 0.90 07/12/2013   HGBA1C 5.9* 07/12/2013    Nm Myocar Single W/spect W/wall Motion And Ef  07/13/2013   CLINICAL DATA:  59 year old female with no known history of coronary artery disease referred for chest pain.  EXAM: MYOCARDIAL IMAGING WITH SPECT (REST AND PHARMACOLOGIC-STRESS)  GATED LEFT VENTRICULAR WALL MOTION STUDY  LEFT VENTRICULAR EJECTION FRACTION  TECHNIQUE: Standard myocardial SPECT imaging was performed after resting  intravenous injection of 10 mCi Tc-62m sestamibi. Subsequently, intravenous infusion of Lexiscan was performed under the supervision of the Cardiology staff. At peak effect of the drug, 30 mCi Tc-64m sestamibi was injected intravenously and standard myocardial SPECT imaging was performed. Quantitative gated imaging was also performed to evaluate left ventricular wall motion, and estimate left ventricular ejection fraction.  COMPARISON:  None.  FINDINGS: Pharmacological stress  Baseline EKG showed normal sinus rhythm with possible anteroseptal Q-waves. After injection heart rate increased from 66 beats per min up to 105 beats per min and blood pressure decreased from 139/91 down to 114/79. The test was stopped after injection was completed, the patient did not experience any chest pain.  Post-injection EKG showed no specific ischemic changes and no significant arrhythmias.  Myocardial perfusion imaging  Raw images showed appropriate radiotracer uptake. There was a small defect noted in the resting images in the apex, this defect was less intense in the post-injection images. The apex had normal wall motion. Overall findings consistent with  apical thinning. There were no other myocardial perfusion defects. Gated imaging showed end-diastolic volume 83 mL, and systolic volume 33 mL, left ventricular ejection fraction 60%, t.i.d. 1.13. There was normal wall motion.  IMPRESSION: 1.  Negative Lexiscan myocardial perfusion imaging for ischemia  2. Normal left ventricular systolic function, left ventricular ejection fraction 60%.  3.  Low risk study for major cardiac events   Electronically Signed   By: Carlyle Dolly   On: 07/13/2013 13:02   Dg Chest Portable 1 View  07/12/2013   CLINICAL DATA:  The right-sided PICC an arm and jaw pain  EXAM: PORTABLE CHEST - 1 VIEW  COMPARISON:  DG CHEST 2V dated 02/26/2013  FINDINGS: The lungs are adequately inflated and clear. The cardiac silhouette is normal in size. The pulmonary  vascularity is not engorged. There is no pleural effusion or pneumothorax. The mediastinum is normal in width. The observed portions of the bony thorax appear normal.  IMPRESSION: There is no evidence of CHF nor pneumonia nor other acute cardiopulmonary abnormality.   Electronically Signed   By: David  Martinique   On: 07/12/2013 12:37    Assessment & Plan:   Maguadalupe was seen today for gad.  Diagnoses and all orders for this visit:  Depression Orders: -     DULoxetine (CYMBALTA) 30 MG capsule; Take 3 capsules (90 mg total) by mouth daily. With supper  GAD (generalized anxiety disorder) Orders: -     DULoxetine (CYMBALTA) 30 MG capsule; Take 3 capsules (90 mg total) by mouth daily. With supper  Other orders -     clonazePAM (KLONOPIN) 1 MG tablet; Take 1 tablet (1 mg total) by mouth 2 (two) times daily. -     Varicella-zoster vaccine subcutaneous   I have discontinued Ms. Leask's oxyCODONE. I have also changed her DULoxetine and clonazePAM. Additionally, I am having her maintain her aspirin EC, fish oil-omega-3 fatty acids, budesonide-formoterol, albuterol, fentaNYL, lisinopril, Vitamin D (Ergocalciferol), loratadine, pantoprazole, atorvastatin, amLODipine, and Oxycodone HCl.  Meds ordered this encounter  Medications  . Oxycodone HCl 10 MG TABS    Sig: Take 10 mg by mouth every 4 (four) hours.  . DULoxetine (CYMBALTA) 30 MG capsule    Sig: Take 3 capsules (90 mg total) by mouth daily. With supper    Dispense:  90 capsule    Refill:  3    Generic ENI:[id number]    30MG   . clonazePAM (KLONOPIN) 1 MG tablet    Sig: Take 1 tablet (1 mg total) by mouth 2 (two) times daily.    Dispense:  60 tablet    Refill:  2    Generic TIR:[id number]   0.5MG  Generic QQP:[id number]   0.5MG      Follow-up: No Follow-up on file.  Claretta Fraise, M.D.

## 2014-06-02 NOTE — Patient Instructions (Addendum)
Read The Power of a Praying Wife, by [employer].  Use a daily multivitamin and 200 units vitamin E.    Vitamin D2000 units regularly  Use cetaphil moisturizer twice daily  Contact [facility] for counseling.

## 2014-06-12 ENCOUNTER — Other Ambulatory Visit: Payer: Self-pay | Admitting: Family Medicine

## 2014-06-19 ENCOUNTER — Other Ambulatory Visit: Payer: Self-pay | Admitting: *Deleted

## 2014-06-19 MED ORDER — CLONAZEPAM 1 MG PO TABS: 1.0000 mg | ORAL_TABLET | Freq: Two times a day (BID) | ORAL | Status: DC

## 2014-06-19 NOTE — Telephone Encounter (Signed)
Medication was approved to phone in but was not called in to pharmacy. Called to [facility] today and left authorization on voicemail.

## 2014-06-19 NOTE — Telephone Encounter (Signed)
Chart says already done, but I keep getting this fax

## 2014-07-03 ENCOUNTER — Ambulatory Visit: Payer: Medicaid Other | Admitting: Family Medicine

## 2014-07-24 ENCOUNTER — Encounter: Payer: Self-pay | Admitting: Internal Medicine

## 2014-08-11 ENCOUNTER — Other Ambulatory Visit: Payer: Self-pay | Admitting: Family Medicine

## 2014-08-17 ENCOUNTER — Telehealth: Payer: Self-pay

## 2014-08-17 NOTE — Telephone Encounter (Signed)
[facility] did a depression questionaire and she scored an 50 which they need to report  She is taking Klonopin and Cymbalta

## 2014-09-12 ENCOUNTER — Other Ambulatory Visit: Payer: Self-pay | Admitting: *Deleted

## 2014-09-12 NOTE — Telephone Encounter (Signed)
Last filled 08/14/14, last seen 06/02/14. Nurse call in at [place]

## 2014-09-12 NOTE — Telephone Encounter (Signed)
Please review and advise.

## 2014-09-13 MED ORDER — CLONAZEPAM 1 MG PO TABS: 1.0000 mg | ORAL_TABLET | Freq: Two times a day (BID) | ORAL | Status: DC

## 2014-09-14 ENCOUNTER — Other Ambulatory Visit: Payer: Medicaid Other

## 2014-09-14 ENCOUNTER — Ambulatory Visit (AMBULATORY_SURGERY_CENTER): Payer: Self-pay

## 2014-09-14 VITALS — Ht 65.0 in | Wt 122.0 lb

## 2014-09-14 DIAGNOSIS — Z800 Family history of malignant neoplasm of digestive organs: Secondary | ICD-10-CM

## 2014-09-14 NOTE — Progress Notes (Signed)
No allergies to eggs or soy No diet/weight loss meds No home oxygen No past problems with anesthesia  No email no computer

## 2014-09-15 ENCOUNTER — Telehealth: Payer: Self-pay | Admitting: Family Medicine

## 2014-09-15 NOTE — Telephone Encounter (Signed)
RX called into [facility] Pt notified

## 2014-09-21 ENCOUNTER — Ambulatory Visit: Payer: Medicaid Other | Admitting: Family Medicine

## 2014-09-28 ENCOUNTER — Encounter: Payer: Self-pay | Admitting: Internal Medicine

## 2014-09-28 ENCOUNTER — Ambulatory Visit (AMBULATORY_SURGERY_CENTER): Payer: Medicaid Other | Admitting: Internal Medicine

## 2014-09-28 VITALS — BP 99/57 | HR 61 | Temp 97.7°F | Resp 18 | Ht 65.0 in | Wt 122.0 lb

## 2014-09-28 DIAGNOSIS — Z800 Family history of malignant neoplasm of digestive organs: Secondary | ICD-10-CM

## 2014-09-28 DIAGNOSIS — D120 Benign neoplasm of cecum: Secondary | ICD-10-CM | POA: Diagnosis not present

## 2014-09-28 DIAGNOSIS — Z1211 Encounter for screening for malignant neoplasm of colon: Secondary | ICD-10-CM

## 2014-09-28 MED ORDER — SODIUM CHLORIDE 0.9 % IV SOLN: 500.0000 mL | INTRAVENOUS | Status: DC

## 2014-09-28 NOTE — Progress Notes (Signed)
Called to room to assist during endoscopic procedure.  Patient ID and intended procedure confirmed with present staff. Received instructions for my participation in the procedure from the performing physician.  

## 2014-09-28 NOTE — Patient Instructions (Addendum)
I found and removed one polyp. You also have a condition called diverticulosis - common and not usually a problem. Please read the handout provided.  I will let you know pathology results and when to have another routine colonoscopy by mail.  I appreciate the opportunity to care for you. Gatha Mayer, MD, FACGYOU HAD AN ENDOSCOPIC PROCEDURE TODAY AT [facility]:   Refer to the procedure report that was given to you for any specific questions about what was found during the examination.  If the procedure report does not answer your questions, please call your gastroenterologist to clarify.  If you requested that your care partner not be given the details of your procedure findings, then the procedure report has been included in a sealed envelope for you to review at your convenience later.  YOU SHOULD EXPECT: Some feelings of bloating in the abdomen. Passage of more gas than usual.  Walking can help get rid of the air that was put into your GI tract during the procedure and reduce the bloating. If you had a lower endoscopy (such as a colonoscopy or flexible sigmoidoscopy) you may notice spotting of blood in your stool or on the toilet paper. If you underwent a bowel prep for your procedure, you may not have a normal bowel movement for a few days.  Please Note:  You might notice some irritation and congestion in your nose or some drainage.  This is from the oxygen used during your procedure.  There is no need for concern and it should clear up in a day or so.  SYMPTOMS TO REPORT IMMEDIATELY:   Following lower endoscopy (colonoscopy or flexible sigmoidoscopy):  Excessive amounts of blood in the stool  Significant tenderness or worsening of abdominal pains  Swelling of the abdomen that is new, acute  Fever of 100F or higher   Following upper endoscopy (EGD)  Vomiting of blood or coffee ground material  New chest pain or pain under the shoulder blades  Painful or  persistently difficult swallowing  New shortness of breath  Fever of 100F or higher  Black, tarry-looking stools  For urgent or emergent issues, a gastroenterologist can be reached at any hour by calling [phone].   DIET: Your first meal following the procedure should be a small meal and then it is ok to progress to your normal diet. Heavy or fried foods are harder to digest and may make you feel nauseous or bloated.  Likewise, meals heavy in dairy and vegetables can increase bloating.  Drink plenty of fluids but you should avoid alcoholic beverages for 24 hours.  ACTIVITY:  You should plan to take it easy for the rest of today and you should NOT DRIVE or use heavy machinery until tomorrow (because of the sedation medicines used during the test).    FOLLOW UP: Our staff will call the number listed on your records the next business day following your procedure to check on you and address any questions or concerns that you may have regarding the information given to you following your procedure. If we do not reach you, we will leave a message.  However, if you are feeling well and you are not experiencing any problems, there is no need to return our call.  We will assume that you have returned to your regular daily activities without incident.  If any biopsies were taken you will be contacted by phone or by letter within the next 1-3 weeks.  Please call  us at [phone] if you have not heard about the biopsies in 3 weeks.    SIGNATURES/CONFIDENTIALITY: You and/or your care partner have signed paperwork which will be entered into your electronic medical record.  These signatures attest to the fact that that the information above on your After Visit Summary has been reviewed and is understood.  Full responsibility of the confidentiality of this discharge information lies with you and/or your care-partner.   YOU HAD AN ENDOSCOPIC PROCEDURE TODAY AT [facility]:   Refer to  the procedure report that was given to you for any specific questions about what was found during the examination.  If the procedure report does not answer your questions, please call your gastroenterologist to clarify.  If you requested that your care partner not be given the details of your procedure findings, then the procedure report has been included in a sealed envelope for you to review at your convenience later.  YOU SHOULD EXPECT: Some feelings of bloating in the abdomen. Passage of more gas than usual.  Walking can help get rid of the air that was put into your GI tract during the procedure and reduce the bloating. If you had a lower endoscopy (such as a colonoscopy or flexible sigmoidoscopy) you may notice spotting of blood in your stool or on the toilet paper. If you underwent a bowel prep for your procedure, you may not have a normal bowel movement for a few days.  Please Note:  You might notice some irritation and congestion in your nose or some drainage.  This is from the oxygen used during your procedure.  There is no need for concern and it should clear up in a day or so.  SYMPTOMS TO REPORT IMMEDIATELY:   Following lower endoscopy (colonoscopy or flexible sigmoidoscopy):  Excessive amounts of blood in the stool  Significant tenderness or worsening of abdominal pains  Swelling of the abdomen that is new, acute  Fever of 100F or higher   Following upper endoscopy (EGD)  Vomiting of blood or coffee ground material  New chest pain or pain under the shoulder blades  Painful or persistently difficult swallowing  New shortness of breath  Fever of 100F or higher  Black, tarry-looking stools  For urgent or emergent issues, a gastroenterologist can be reached at any hour by calling [phone].   DIET: Your first meal following the procedure should be a small meal and then it is ok to progress to your normal diet. Heavy or fried foods are harder to digest and may make you feel  nauseous or bloated.  Likewise, meals heavy in dairy and vegetables can increase bloating.  Drink plenty of fluids but you should avoid alcoholic beverages for 24 hours.  ACTIVITY:  You should plan to take it easy for the rest of today and you should NOT DRIVE or use heavy machinery until tomorrow (because of the sedation medicines used during the test).    FOLLOW UP: Our staff will call the number listed on your records the next business day following your procedure to check on you and address any questions or concerns that you may have regarding the information given to you following your procedure. If we do not reach you, we will leave a message.  However, if you are feeling well and you are not experiencing any problems, there is no need to return our call.  We will assume that you have returned to your regular daily activities without incident.  If any biopsies were taken you will be contacted by phone or by letter within the next 1-3 weeks.  Please call us at [phone] if you have not heard about the biopsies in 3 weeks.    SIGNATURES/CONFIDENTIALITY: You and/or your care partner have signed paperwork which will be entered into your electronic medical record.  These signatures attest to the fact that that the information above on your After Visit Summary has been reviewed and is understood.  Full responsibility of the confidentiality of this discharge information lies with you and/or your care-partner.  NO NSAIDS NOR ASPIRIN FOR TWO WEEKS PER DR Bonne Whack.  WILL NEED A TWO DAY PREP NEXT TIME.

## 2014-09-28 NOTE — Op Note (Signed)
[facility]  [address] [place], [id number]   COLONOSCOPY PROCEDURE REPORT  PATIENT: [name], [name]  MR#: [id number] BIRTHDATE: [birth year] , 59  yrs. old GENDER: female ENDOSCOPIST: Gatha Mayer, MD, [facility] PROCEDURE DATE:  09/28/2014 PROCEDURE:   Colonoscopy, screening and Colonoscopy with snare polypectomy First Screening Colonoscopy - Avg.  risk and is 50 yrs.  old or older - No.  Prior Negative Screening - Now for repeat screening. Less than 10 yrs Prior Negative Screening - Now for repeat screening.  Above average risk  History of Adenoma - Now for follow-up colonoscopy & has been > or = to 3 yrs.  N/A  Polyps removed today? Yes ASA CLASS:   Class II INDICATIONS:Screening for colonic neoplasia and FH Colon or Rectal Adenocarcinoma. MEDICATIONS: Propofol 300 mg IV, Monitored anesthesia care, and Lidocaine 100 mg IV  DESCRIPTION OF PROCEDURE:   After the risks benefits and alternatives of the procedure were thoroughly explained, informed consent was obtained.  The digital rectal exam revealed no abnormalities of the rectum.   The LB [id number]  endoscope was introduced through the anus and advanced to the cecum, which was identified by both the appendix and ileocecal valve. No adverse events experienced.   The quality of the prep was adequate (MiraLax was used)  The instrument was then slowly withdrawn as the colon was fully examined. Estimated blood loss is zero unless otherwise noted in this procedure report.      COLON FINDINGS: A polypoid shaped sessile polyp measuring 8 mm in size was found at the cecum.  A polypectomy was performed using snare cautery.  The resection was complete, the polyp tissue was completely retrieved and sent to histology.   There was severe diverticulosis noted in the sigmoid colon.   The examination was otherwise normal.  Retroflexed views revealed no abnormalities. The time to cecum = 4.8 Withdrawal time = 19.8    The scope was withdrawn and the procedure completed. COMPLICATIONS: There were no immediate complications.  ENDOSCOPIC IMPRESSION: 1.   Sessile polyp was found at the cecum; polypectomy was performed using snare cautery 2.   Severe diverticulosis was noted in the sigmoid colon 3.   The examination was otherwise normal - adequate prep  RECOMMENDATIONS: 1.  Likely repeat colonoscopy 3 years would use extra prep - had to flush extensively 2.  Hold Aspirin and all other NSAIDS for 2 weeks.  eSigned:  Gatha Mayer, MD, [facility] 09/28/2014 11:48 AM   cc: Claretta Fraise, MD and The Patient

## 2014-09-28 NOTE — Progress Notes (Signed)
Report to PACU, RN, vss, BBS= Clear.  

## 2014-09-29 ENCOUNTER — Telehealth: Payer: Self-pay

## 2014-09-29 NOTE — Telephone Encounter (Signed)
No answer, left voicemail

## 2014-10-03 ENCOUNTER — Encounter: Payer: Self-pay | Admitting: Internal Medicine

## 2014-10-03 DIAGNOSIS — Z860101 Personal history of adenomatous and serrated colon polyps: Secondary | ICD-10-CM

## 2014-10-03 DIAGNOSIS — Z86010 Personal history of colonic polyps: Secondary | ICD-10-CM

## 2014-10-03 HISTORY — DX: Personal history of colonic polyps: Z86.010

## 2014-10-03 HISTORY — DX: Personal history of adenomatous and serrated colon polyps: Z86.0101

## 2014-10-03 NOTE — Progress Notes (Signed)
Quick Note:  Cecal adenoma + FHx CRCA Repeat colonoscopy 2019 - extra prep ______

## 2014-10-10 ENCOUNTER — Other Ambulatory Visit: Payer: Self-pay | Admitting: Family Medicine

## 2014-11-09 ENCOUNTER — Encounter: Payer: Self-pay | Admitting: Family Medicine

## 2014-11-09 ENCOUNTER — Other Ambulatory Visit: Payer: Self-pay | Admitting: Family Medicine

## 2014-11-09 ENCOUNTER — Ambulatory Visit (INDEPENDENT_AMBULATORY_CARE_PROVIDER_SITE_OTHER): Payer: Medicaid Other | Admitting: Family Medicine

## 2014-11-09 VITALS — BP 105/57 | HR 87 | Temp 98.0°F | Ht 65.0 in | Wt 120.6 lb

## 2014-11-09 DIAGNOSIS — I10 Essential (primary) hypertension: Secondary | ICD-10-CM | POA: Diagnosis not present

## 2014-11-09 DIAGNOSIS — F329 Major depressive disorder, single episode, unspecified: Secondary | ICD-10-CM | POA: Diagnosis not present

## 2014-11-09 DIAGNOSIS — F32A Depression, unspecified: Secondary | ICD-10-CM

## 2014-11-09 DIAGNOSIS — F411 Generalized anxiety disorder: Secondary | ICD-10-CM

## 2014-11-09 MED ORDER — CLONAZEPAM 1 MG PO TABS: 1.0000 mg | ORAL_TABLET | Freq: Two times a day (BID) | ORAL | Status: DC

## 2014-11-09 MED ORDER — DULOXETINE HCL 60 MG PO CPEP
60.0000 mg | ORAL_CAPSULE | Freq: Two times a day (BID) | ORAL | Status: DC
Start: 2014-11-09 — End: 2015-01-22

## 2014-11-09 MED ORDER — BUPROPION HCL ER (XL) 150 MG PO TB24: ORAL_TABLET | ORAL | Status: DC
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
## 2014-11-10 ENCOUNTER — Telehealth: Payer: Self-pay | Admitting: Family Medicine

## 2014-11-10 NOTE — Telephone Encounter (Signed)
Please advise 

## 2014-11-10 NOTE — Telephone Encounter (Signed)
Spoke with pt regarding RXs  Confirmed receipt of RXs with [facility] Pt notified

## 2014-12-11 ENCOUNTER — Other Ambulatory Visit: Payer: Self-pay | Admitting: Family Medicine

## 2015-01-08 ENCOUNTER — Other Ambulatory Visit: Payer: Self-pay | Admitting: Family Medicine

## 2015-01-11 ENCOUNTER — Other Ambulatory Visit: Payer: Self-pay | Admitting: *Deleted

## 2015-01-11 MED ORDER — CLONAZEPAM 1 MG PO TABS: 1.0000 mg | ORAL_TABLET | Freq: Two times a day (BID) | ORAL | Status: DC

## 2015-01-11 NOTE — Telephone Encounter (Signed)
Last seen 11/09/14, last filled 12/09/14. Route to pool B. Nurse call in at [place]

## 2015-01-11 NOTE — Telephone Encounter (Signed)
Forwarding to Dr. Livia Snellen

## 2015-01-12 ENCOUNTER — Other Ambulatory Visit: Payer: Self-pay | Admitting: Family Medicine

## 2015-01-12 NOTE — Telephone Encounter (Signed)
Refill called to [facility] 

## 2015-01-22 ENCOUNTER — Ambulatory Visit (INDEPENDENT_AMBULATORY_CARE_PROVIDER_SITE_OTHER): Payer: Medicaid Other | Admitting: Family Medicine

## 2015-01-22 ENCOUNTER — Encounter: Payer: Self-pay | Admitting: Family Medicine

## 2015-01-22 VITALS — BP 135/80 | HR 75 | Temp 98.5°F | Ht 65.0 in | Wt 123.0 lb

## 2015-01-22 DIAGNOSIS — F1120 Opioid dependence, uncomplicated: Secondary | ICD-10-CM | POA: Insufficient documentation

## 2015-01-22 DIAGNOSIS — S40022A Contusion of left upper arm, initial encounter: Secondary | ICD-10-CM | POA: Diagnosis not present

## 2015-01-22 DIAGNOSIS — S40021A Contusion of right upper arm, initial encounter: Secondary | ICD-10-CM

## 2015-01-22 DIAGNOSIS — F1310 Sedative, hypnotic or anxiolytic abuse, uncomplicated: Secondary | ICD-10-CM | POA: Diagnosis not present

## 2015-01-22 DIAGNOSIS — F341 Dysthymic disorder: Secondary | ICD-10-CM | POA: Diagnosis not present

## 2015-01-22 MED ORDER — CLONAZEPAM 0.5 MG PO TABS: 0.5000 mg | ORAL_TABLET | Freq: Two times a day (BID) | ORAL | Status: DC

## 2015-01-22 MED ORDER — CEPHALEXIN 500 MG PO CAPS: 500.0000 mg | ORAL_CAPSULE | Freq: Three times a day (TID) | ORAL | Status: DC
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
## 2015-01-24 ENCOUNTER — Telehealth: Payer: Self-pay | Admitting: Family Medicine

## 2015-01-24 NOTE — Telephone Encounter (Signed)
[name], please contact patient, she has a question she wants to ask you

## 2015-01-24 NOTE — Telephone Encounter (Signed)
Pt wanted results of recent labs Informed pt that labs are not back yet Pt verbalizes understanding

## 2015-01-29 LAB — TOXASSURE SELECT 13 (MW), URINE: PDF: 0

## 2015-02-06 ENCOUNTER — Telehealth: Payer: Self-pay | Admitting: Family Medicine

## 2015-02-06 MED ORDER — LISINOPRIL 20 MG PO TABS: 20.0000 mg | ORAL_TABLET | Freq: Every day | ORAL | Status: DC

## 2015-02-06 MED ORDER — CLONAZEPAM 0.5 MG PO TABS: 0.5000 mg | ORAL_TABLET | Freq: Two times a day (BID) | ORAL | Status: DC

## 2015-02-06 NOTE — Telephone Encounter (Signed)
Prescription signed! I appreciate it very much. Thanks for the help. WS!

## 2015-02-06 NOTE — Telephone Encounter (Signed)
Please determine which medicines are to be refilled by name. Then put them in as a refill request and I will consider them. Thanks, WS

## 2015-02-06 NOTE — Telephone Encounter (Signed)
Spoke with pharmacy and they wanted to verify we were decreasing the clonazepam to 0.5

## 2015-02-06 NOTE — Telephone Encounter (Signed)
Patient aware that rx sent into pharmacy 

## 2015-02-07 ENCOUNTER — Other Ambulatory Visit: Payer: Self-pay | Admitting: Family Medicine

## 2015-02-09 ENCOUNTER — Ambulatory Visit (INDEPENDENT_AMBULATORY_CARE_PROVIDER_SITE_OTHER): Payer: Medicaid Other | Admitting: Family Medicine

## 2015-02-09 ENCOUNTER — Encounter: Payer: Self-pay | Admitting: Family Medicine

## 2015-02-09 ENCOUNTER — Telehealth: Payer: Self-pay | Admitting: Family Medicine

## 2015-02-09 VITALS — BP 130/77 | HR 93 | Temp 98.6°F | Ht 65.0 in | Wt 118.6 lb

## 2015-02-09 DIAGNOSIS — I10 Essential (primary) hypertension: Secondary | ICD-10-CM

## 2015-02-09 DIAGNOSIS — G4700 Insomnia, unspecified: Secondary | ICD-10-CM

## 2015-02-09 DIAGNOSIS — F1120 Opioid dependence, uncomplicated: Secondary | ICD-10-CM | POA: Diagnosis not present

## 2015-02-09 DIAGNOSIS — F1310 Sedative, hypnotic or anxiolytic abuse, uncomplicated: Secondary | ICD-10-CM

## 2015-02-09 DIAGNOSIS — F341 Dysthymic disorder: Secondary | ICD-10-CM

## 2015-02-09 MED ORDER — DULOXETINE HCL 30 MG PO CPEP: ORAL_CAPSULE | ORAL | Status: DC

## 2015-02-09 MED ORDER — QUETIAPINE FUMARATE ER 200 MG PO TB24: 200.0000 mg | ORAL_TABLET | Freq: Every day | ORAL | Status: DC

## 2015-02-09 MED ORDER — TRAZODONE HCL 150 MG PO TABS: ORAL_TABLET | ORAL | Status: DC

## 2015-02-09 MED ORDER — AMOXICILLIN-POT CLAVULANATE 875-125 MG PO TABS: 1.0000 | ORAL_TABLET | Freq: Two times a day (BID) | ORAL | Status: DC

## 2015-02-09 MED ORDER — CLONAZEPAM 0.5 MG PO TABS: ORAL_TABLET | ORAL | Status: DC
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
## 2015-02-09 NOTE — Telephone Encounter (Signed)
Spoke with pt to confirm directions for RXs Pt verbalizes understanding

## 2015-02-20 ENCOUNTER — Telehealth (HOSPITAL_COMMUNITY): Payer: Self-pay | Admitting: *Deleted

## 2015-02-20 NOTE — Telephone Encounter (Signed)
Ref sent to office from [facility]. Called pt to sch appt and no answer. lmtcb number provided

## 2015-03-14 ENCOUNTER — Other Ambulatory Visit: Payer: Self-pay | Admitting: *Deleted

## 2015-03-14 NOTE — Telephone Encounter (Signed)
Pt notified she was to taper off med She understands this and will follow up with behavioral health

## 2015-03-14 NOTE — Telephone Encounter (Signed)
Order says to D/C? Is this right, at least shorten the sig. Last filled 02/06/15, last seen 02/09/15. Call in at [facility]

## 2015-04-02 ENCOUNTER — Encounter (HOSPITAL_COMMUNITY): Payer: Self-pay | Admitting: Psychiatry

## 2015-04-02 ENCOUNTER — Ambulatory Visit (INDEPENDENT_AMBULATORY_CARE_PROVIDER_SITE_OTHER): Payer: Medicaid Other | Admitting: Psychiatry

## 2015-04-02 VITALS — BP 141/74 | HR 82 | Ht 65.0 in | Wt 121.8 lb

## 2015-04-02 DIAGNOSIS — F332 Major depressive disorder, recurrent severe without psychotic features: Secondary | ICD-10-CM | POA: Diagnosis not present

## 2015-04-02 DIAGNOSIS — F329 Major depressive disorder, single episode, unspecified: Secondary | ICD-10-CM | POA: Insufficient documentation

## 2015-04-02 MED ORDER — CLONAZEPAM 1 MG PO TABS: 1.0000 mg | ORAL_TABLET | Freq: Two times a day (BID) | ORAL | Status: DC

## 2015-04-02 MED ORDER — DULOXETINE HCL 60 MG PO CPEP
60.0000 mg | ORAL_CAPSULE | Freq: Every day | ORAL | Status: DC
Start: 2015-04-02 — End: 2015-05-01
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
## 2015-04-09 ENCOUNTER — Other Ambulatory Visit: Payer: Self-pay | Admitting: Family Medicine

## 2015-04-09 NOTE — Telephone Encounter (Signed)
No lipid/liver labs since 05/2014

## 2015-04-09 NOTE — Telephone Encounter (Signed)
Please review and advise.

## 2015-04-19 ENCOUNTER — Ambulatory Visit (HOSPITAL_COMMUNITY): Payer: Self-pay | Admitting: Psychiatry

## 2015-05-01 ENCOUNTER — Encounter (HOSPITAL_COMMUNITY): Payer: Self-pay | Admitting: Psychiatry

## 2015-05-01 ENCOUNTER — Ambulatory Visit (INDEPENDENT_AMBULATORY_CARE_PROVIDER_SITE_OTHER): Payer: Medicaid Other | Admitting: Psychiatry

## 2015-05-01 VITALS — BP 140/82 | HR 92 | Ht 65.0 in | Wt 126.6 lb

## 2015-05-01 DIAGNOSIS — F332 Major depressive disorder, recurrent severe without psychotic features: Secondary | ICD-10-CM | POA: Diagnosis not present

## 2015-05-01 MED ORDER — DULOXETINE HCL 60 MG PO CPEP: 60.0000 mg | ORAL_CAPSULE | Freq: Two times a day (BID) | ORAL | Status: DC

## 2015-05-01 MED ORDER — TRAZODONE HCL 150 MG PO TABS: ORAL_TABLET | ORAL | Status: DC
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
## 2015-05-08 ENCOUNTER — Other Ambulatory Visit: Payer: Self-pay | Admitting: Family Medicine

## 2015-05-17 ENCOUNTER — Encounter: Payer: Self-pay | Admitting: Nurse Practitioner

## 2015-05-17 ENCOUNTER — Ambulatory Visit (INDEPENDENT_AMBULATORY_CARE_PROVIDER_SITE_OTHER): Payer: Medicaid Other | Admitting: Nurse Practitioner

## 2015-05-17 ENCOUNTER — Ambulatory Visit (INDEPENDENT_AMBULATORY_CARE_PROVIDER_SITE_OTHER): Payer: Medicaid Other

## 2015-05-17 VITALS — BP 130/81 | HR 82 | Temp 98.0°F | Ht 65.0 in | Wt 127.0 lb

## 2015-05-17 DIAGNOSIS — Z23 Encounter for immunization: Secondary | ICD-10-CM | POA: Diagnosis not present

## 2015-05-17 DIAGNOSIS — Z1159 Encounter for screening for other viral diseases: Secondary | ICD-10-CM

## 2015-05-17 DIAGNOSIS — I10 Essential (primary) hypertension: Secondary | ICD-10-CM

## 2015-05-17 DIAGNOSIS — Z0000 Encounter for general adult medical examination without abnormal findings: Secondary | ICD-10-CM | POA: Diagnosis not present

## 2015-05-17 DIAGNOSIS — J439 Emphysema, unspecified: Secondary | ICD-10-CM

## 2015-05-17 DIAGNOSIS — F332 Major depressive disorder, recurrent severe without psychotic features: Secondary | ICD-10-CM

## 2015-05-17 DIAGNOSIS — Z01419 Encounter for gynecological examination (general) (routine) without abnormal findings: Secondary | ICD-10-CM

## 2015-05-17 DIAGNOSIS — F1120 Opioid dependence, uncomplicated: Secondary | ICD-10-CM

## 2015-05-17 DIAGNOSIS — F341 Dysthymic disorder: Secondary | ICD-10-CM

## 2015-05-17 DIAGNOSIS — E785 Hyperlipidemia, unspecified: Secondary | ICD-10-CM | POA: Diagnosis not present

## 2015-05-17 DIAGNOSIS — K219 Gastro-esophageal reflux disease without esophagitis: Secondary | ICD-10-CM

## 2015-05-17 DIAGNOSIS — M25511 Pain in right shoulder: Secondary | ICD-10-CM

## 2015-05-17 DIAGNOSIS — G4700 Insomnia, unspecified: Secondary | ICD-10-CM

## 2015-05-17 LAB — URINALYSIS
Bilirubin, UA: NEGATIVE
GLUCOSE, UA: NEGATIVE
KETONES UA: NEGATIVE
LEUKOCYTES UA: NEGATIVE
NITRITE UA: NEGATIVE
Protein, UA: NEGATIVE
RBC, UA: NEGATIVE
Specific Gravity, UA: 1.02 (ref 1.005–1.030)
Urobilinogen, Ur: 0.2 mg/dL (ref 0.2–1.0)
pH, UA: 7 (ref 5.0–7.5)

## 2015-05-17 MED ORDER — TRAZODONE HCL 150 MG PO TABS: ORAL_TABLET | ORAL | Status: DC

## 2015-05-17 MED ORDER — AMLODIPINE BESYLATE 5 MG PO TABS: 5.0000 mg | ORAL_TABLET | Freq: Every day | ORAL | Status: DC

## 2015-05-17 MED ORDER — LISINOPRIL 20 MG PO TABS: 20.0000 mg | ORAL_TABLET | Freq: Every day | ORAL | Status: DC

## 2015-05-17 MED ORDER — PANTOPRAZOLE SODIUM 40 MG PO TBEC: DELAYED_RELEASE_TABLET | ORAL | Status: DC

## 2015-05-17 MED ORDER — ATORVASTATIN CALCIUM 40 MG PO TABS: 40.0000 mg | ORAL_TABLET | Freq: Every day | ORAL | Status: DC

## 2015-05-17 NOTE — Addendum Note (Signed)
Addended by: Chevis Pretty on: 05/17/2015 11:53 AM   Modules accepted: Orders, SmartSet

## 2015-05-17 NOTE — Patient Instructions (Signed)
Shoulder Pain The shoulder is the joint that connects your arm to your body. Muscles and band-like tissues that connect bones to muscles (tendons) hold the joint together. Shoulder pain is felt if an injury or medical problem affects one or more parts of the shoulder. HOME CARE   Put ice on the sore area.  Put ice in a plastic bag.  Place a towel between your skin and the bag.  Leave the ice on for 15-20 minutes, 03-04 times a day for the first 2 days.  Stop using cold packs if they do not help with the pain.  If you were given something to keep your shoulder from moving (sling; shoulder immobilizer), wear it as told. Only take it off to shower or bathe.  Move your arm as little as possible, but keep your hand moving to prevent puffiness (swelling).  Squeeze a soft ball or foam pad as much as possible to help prevent swelling.  Take medicine as told by your doctor. GET HELP IF:  You have progressing new pain in your arm, hand, or fingers.  Your hand or fingers get cold.  Your medicine does not help lessen your pain. GET HELP RIGHT AWAY IF:   Your arm, hand, or fingers are numb or tingling.  Your arm, hand, or fingers are puffy (swollen), painful, or turn white or blue. MAKE SURE YOU:   Understand these instructions.  Will watch your condition.  Will get help right away if you are not doing well or get worse.   This information is not intended to replace advice given to you by your health care provider. Make sure you discuss any questions you have with your health care provider.   Document Released: 08/06/2007 Document Revised: 03/10/2014 Document Reviewed: 06/12/2014 Elsevier Interactive Patient Education 2016 [employer]  

## 2015-05-17 NOTE — Progress Notes (Addendum)
Subjective:    Patient ID: [name], female    DOB: 07-24-1955, 60 y.o.   MRN: [id number]  Patient here today for follow up of chronic medical problems.  Outpatient Encounter Prescriptions as of 05/17/2015  Medication Sig  . amLODipine (NORVASC) 5 MG tablet TAKE 1 TABLET BY MOUTH EVERY DAY  . Ascorbic Acid (VITAMIN C) 1000 MG tablet Take 1,000 mg by mouth daily.  Marland Kitchen aspirin EC 81 MG tablet Take 81 mg by mouth daily.  Marland Kitchen atorvastatin (LIPITOR) 40 MG tablet TAKE 1 TABLET BY MOUTH AT BEDTIME  . budesonide-formoterol (SYMBICORT) 160-4.5 MCG/ACT inhaler Inhale 2 puffs into the lungs 2 (two) times daily.  . clonazePAM (KLONOPIN) 1 MG tablet Take 1 tablet (1 mg total) by mouth 2 (two) times daily.  . DULoxetine (CYMBALTA) 60 MG capsule Take 1 capsule (60 mg total) by mouth 2 (two) times daily.  . fish oil-omega-3 fatty acids 1000 MG capsule Take 1 g by mouth daily.   Marland Kitchen lisinopril (PRINIVIL,ZESTRIL) 20 MG tablet Take 1 tablet (20 mg total) by mouth daily.  Marland Kitchen loratadine (CLARITIN) 10 MG tablet TAKE 1 TABLET BY MOUTH EVERY DAY  . Multiple Vitamin (MULTIVITAMIN) tablet Take 1 tablet by mouth daily.  Marland Kitchen oxyCODONE-acetaminophen (PERCOCET) 7.5-325 MG tablet Take 1 tablet by mouth 4 (four) times daily.  . pantoprazole (PROTONIX) 40 MG tablet TAKE 1 TABLET BY MOUTH AT 12 NOON DAILY  . Tapentadol HCl (NUCYNTA ER) 150 MG TB12 Take by mouth. Take 1 tablet q 12 hrs  . traZODone (DESYREL) 150 MG tablet 1 or 2 at bedtime for sleep     Hypertension This is a chronic problem. The current episode started more than 1 year ago. The problem is controlled. Pertinent negatives include no chest pain, headaches or palpitations. Risk factors for coronary artery disease include dyslipidemia and post-menopausal state. Past treatments include ACE inhibitors and calcium channel blockers. The current treatment provides moderate improvement. Compliance problems include diet and exercise.  There is no history of CAD/MI or  CVA.  Hyperlipidemia This is a chronic problem. The current episode started more than 1 year ago. Recent lipid tests were reviewed and are variable. Pertinent negatives include no chest pain. Current antihyperlipidemic treatment includes statins. The current treatment provides moderate improvement of lipids. Compliance problems include adherence to diet and adherence to exercise.  Risk factors for coronary artery disease include dyslipidemia and hypertension.  COPD symbicort daily- doing well- has not needed albuterol in awhile GAD Klonopin keeps her calm and from worrying so much Depression Cymbalta keeps her from worrying so much insomnia Takes trazadone nightlly and rests well. GERD protonix works well to keep symptoms under control Opiate dependence Takes percocet for chronic back pain- goes to preferred pain management in [place].  * c/o continued shoulder pain- saw ortho in [place] and was injected at end of January- wants another injection.  Review of Systems  Constitutional: Negative.   HENT: Negative.   Respiratory: Negative.   Cardiovascular: Negative for chest pain and palpitations.  Genitourinary: Negative.   Neurological: Negative.  Negative for headaches.  Psychiatric/Behavioral: Negative.   All other systems reviewed and are negative.      Objective:   Physical Exam  Constitutional: She is oriented to person, place, and time. She appears well-developed and well-nourished.  HENT:  Head: Normocephalic.  Right Ear: Hearing, tympanic membrane, external ear and ear canal normal.  Left Ear: Hearing, tympanic membrane, external ear and ear canal normal.  Nose: Nose normal.  Mouth/Throat: Uvula is midline and oropharynx is clear and moist.  Eyes: Conjunctivae and EOM are normal. Pupils are equal, round, and reactive to light.  Neck: Normal range of motion and full passive range of motion without pain. Neck supple. No JVD present. Carotid bruit is not present. No thyroid  mass and no thyromegaly present.  Cardiovascular: Normal rate, normal heart sounds and intact distal pulses.   No murmur heard. Pulmonary/Chest: Effort normal and breath sounds normal. Right breast exhibits no inverted nipple, no mass, no nipple discharge, no skin change and no tenderness. Left breast exhibits no inverted nipple, no mass, no nipple discharge, no skin change and no tenderness.  Abdominal: Soft. Bowel sounds are normal. She exhibits no mass. There is no tenderness.  Genitourinary: Vagina normal and uterus normal. No breast swelling, tenderness, discharge or bleeding.  bimanual exam-No adnexal masses or tenderness. Vaginal cuff intact  Musculoskeletal: Normal range of motion.  Decrease ROM of right shoulder due to pain on abduction and internal rotation  Lymphadenopathy:    She has no cervical adenopathy.  Neurological: She is alert and oriented to person, place, and time.  Skin: Skin is warm and dry.  Psychiatric: She has a normal mood and affect. Her behavior is normal. Judgment and thought content normal.    BP 130/81 mmHg  Pulse 82  Temp(Src) 98 F (36.7 C) (Oral)  Ht _0  (1.651 m)  Wt 127 lb (57.607 kg)  BMI 21.13 kg/m2  Chest xray- Chronic bronchitic changes-Preliminary reading by Ronnald Collum, FNP  [facility]   EKG- NSR with PVC's-Mary-Margaret Hassell Done, FNP      Assessment & Plan:  1. Encounter for routine gynecological examination - Pap IG w/ reflex to HPV when ASC-U  2. Annual physical exam - CBC with Differential/Platelet - Thyroid Panel With TSH - Urinalysis  3. Essential hypertension Do not add salt o diet - CMP14+EGFR - lisinopril (PRINIVIL,ZESTRIL) 20 MG tablet; Take 1 tablet (20 mg total) by mouth daily.  Dispense: 30 tablet; Refill: 5 - amLODipine (NORVASC) 5 MG tablet; Take 1 tablet (5 mg total) by mouth daily.  Dispense: 30 tablet; Refill: 5  4. Pulmonary emphysema, unspecified emphysema type ([place]) STOP SMOKING  5. ANXIETY DEPRESSION Stress  management  6. Hyperlipidemia Low fat diet - Lipid panel - atorvastatin (LIPITOR) 40 MG tablet; Take 1 tablet (40 mg total) by mouth at bedtime.  Dispense: 30 tablet; Refill: 5  7. INSOMNIA Bedtime ritual - traZODone (DESYREL) 150 MG tablet; 1 or 2 at bedtime for sleep  Dispense: 60 tablet; Refill: 5  8. Uncomplicated opioid dependence ([place]) Follow up with pain management  9. Severe episode of recurrent major depressive disorder, without psychotic features ([place]) Stress management  10. Need for hepatitis C screening test - Hepatitis C antibody  11. Gastroesophageal reflux disease without esophagitis Avoid spicy foods Do not eat 2 hours prior to bedtime - pantoprazole (PROTONIX) 40 MG tablet; TAKE 1 TABLET BY MOUTH AT 12 NOON DAILY  Dispense: 30 tablet; Refill: 5  12. Pain in joint of right shoulder Follow up with orthopedist    Labs pending Health maintenance reviewed Diet and exercise encouraged Continue all meds Follow up  In 6 months   [place], FNP

## 2015-05-18 LAB — CBC WITH DIFFERENTIAL/PLATELET
BASOS: 0 %
Basophils Absolute: 0 10*3/uL (ref 0.0–0.2)
EOS (ABSOLUTE): 0.1 10*3/uL (ref 0.0–0.4)
EOS: 1 %
HEMOGLOBIN: 13.4 g/dL (ref 11.1–15.9)
Hematocrit: 40.6 % (ref 34.0–46.6)
Immature Grans (Abs): 0 10*3/uL (ref 0.0–0.1)
Immature Granulocytes: 0 %
LYMPHS ABS: 2.7 10*3/uL (ref 0.7–3.1)
Lymphs: 28 %
MCH: 31.7 pg (ref 26.6–33.0)
MCHC: 33 g/dL (ref 31.5–35.7)
MCV: 96 fL (ref 79–97)
MONOS ABS: 0.5 10*3/uL (ref 0.1–0.9)
Monocytes: 5 %
NEUTROS PCT: 66 %
Neutrophils Absolute: 6.2 10*3/uL (ref 1.4–7.0)
PLATELETS: 354 10*3/uL (ref 150–379)
RBC: 4.23 x10E6/uL (ref 3.77–5.28)
RDW: 13 % (ref 12.3–15.4)
WBC: 9.5 10*3/uL (ref 3.4–10.8)

## 2015-05-18 LAB — CMP14+EGFR
A/G RATIO: 1.8 (ref 1.2–2.2)
ALBUMIN: 3.9 g/dL (ref 3.6–4.8)
ALT: 13 IU/L (ref 0–32)
AST: 12 IU/L (ref 0–40)
Alkaline Phosphatase: 76 IU/L (ref 39–117)
BILIRUBIN TOTAL: 0.3 mg/dL (ref 0.0–1.2)
BUN / CREAT RATIO: 25 (ref 11–26)
BUN: 15 mg/dL (ref 8–27)
CO2: 30 mmol/L — ABNORMAL HIGH (ref 18–29)
Calcium: 9.2 mg/dL (ref 8.7–10.3)
Chloride: 103 mmol/L (ref 96–106)
Creatinine, Ser: 0.6 mg/dL (ref 0.57–1.00)
GFR calc non Af Amer: 99 mL/min/{1.73_m2} (ref >59)
GFR, EST AFRICAN AMERICAN: 114 mL/min/{1.73_m2} (ref >59)
Globulin, Total: 2.2 g/dL (ref 1.5–4.5)
Glucose: 89 mg/dL (ref 65–99)
POTASSIUM: 4 mmol/L (ref 3.5–5.2)
SODIUM: 144 mmol/L (ref 134–144)
TOTAL PROTEIN: 6.1 g/dL (ref 6.0–8.5)

## 2015-05-18 LAB — LIPID PANEL
Chol/HDL Ratio: 3 ratio units (ref 0.0–4.4)
Cholesterol, Total: 165 mg/dL (ref 100–199)
HDL: 55 mg/dL (ref >39)
LDL Calculated: 94 mg/dL (ref 0–99)
Triglycerides: 82 mg/dL (ref 0–149)
VLDL Cholesterol Cal: 16 mg/dL (ref 5–40)

## 2015-05-18 LAB — THYROID PANEL WITH TSH
FREE THYROXINE INDEX: 2 (ref 1.2–4.9)
T3 UPTAKE RATIO: 28 % (ref 24–39)
T4 TOTAL: 7.1 ug/dL (ref 4.5–12.0)
TSH: 1.08 u[IU]/mL (ref 0.450–4.500)

## 2015-05-18 LAB — HEPATITIS C ANTIBODY: Hep C Virus Ab: 0.1 s/co ratio (ref 0.0–0.9)

## 2015-05-21 LAB — PAP IG W/ RFLX HPV ASCU: PAP SMEAR COMMENT: 0

## 2015-05-29 ENCOUNTER — Encounter (HOSPITAL_COMMUNITY): Payer: Self-pay | Admitting: Psychiatry

## 2015-05-29 ENCOUNTER — Ambulatory Visit (INDEPENDENT_AMBULATORY_CARE_PROVIDER_SITE_OTHER): Payer: Medicaid Other | Admitting: Psychiatry

## 2015-05-29 VITALS — BP 129/73 | HR 83 | Ht 65.0 in | Wt 125.4 lb

## 2015-05-29 DIAGNOSIS — F332 Major depressive disorder, recurrent severe without psychotic features: Secondary | ICD-10-CM | POA: Diagnosis not present

## 2015-05-29 MED ORDER — DULOXETINE HCL 60 MG PO CPEP: 60.0000 mg | ORAL_CAPSULE | Freq: Every day | ORAL | Status: DC

## 2015-05-29 MED ORDER — CLONAZEPAM 1 MG PO TABS: 1.0000 mg | ORAL_TABLET | Freq: Two times a day (BID) | ORAL | Status: DC
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
## 2015-05-31 ENCOUNTER — Ambulatory Visit (INDEPENDENT_AMBULATORY_CARE_PROVIDER_SITE_OTHER): Payer: Medicaid Other | Admitting: Psychiatry

## 2015-05-31 ENCOUNTER — Encounter (HOSPITAL_COMMUNITY): Payer: Self-pay | Admitting: Psychiatry

## 2015-05-31 DIAGNOSIS — F332 Major depressive disorder, recurrent severe without psychotic features: Secondary | ICD-10-CM | POA: Diagnosis not present

## 2015-05-31 NOTE — Patient Instructions (Signed)
Discussed orally 
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
## 2015-06-04 ENCOUNTER — Telehealth: Payer: Self-pay

## 2015-06-04 DIAGNOSIS — Z139 Encounter for screening, unspecified: Secondary | ICD-10-CM

## 2015-06-04 NOTE — Telephone Encounter (Signed)
Patients wants a referral for colonoscopy in [place]

## 2015-06-04 NOTE — Telephone Encounter (Signed)
Please  write and I will sign. Thanks, WS 

## 2015-06-04 NOTE — Telephone Encounter (Signed)
Referral entered in Epic.  ° °

## 2015-06-07 ENCOUNTER — Other Ambulatory Visit: Payer: Self-pay | Admitting: Family Medicine

## 2015-06-20 ENCOUNTER — Ambulatory Visit (INDEPENDENT_AMBULATORY_CARE_PROVIDER_SITE_OTHER): Payer: Medicaid Other | Admitting: Psychiatry

## 2015-06-20 ENCOUNTER — Encounter (HOSPITAL_COMMUNITY): Payer: Self-pay | Admitting: Psychiatry

## 2015-06-20 ENCOUNTER — Ambulatory Visit (HOSPITAL_COMMUNITY): Payer: Self-pay | Admitting: Psychiatry

## 2015-06-20 DIAGNOSIS — F332 Major depressive disorder, recurrent severe without psychotic features: Secondary | ICD-10-CM
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
## 2015-06-20 NOTE — Patient Instructions (Signed)
Discussed orally 

## 2015-07-05 ENCOUNTER — Ambulatory Visit (INDEPENDENT_AMBULATORY_CARE_PROVIDER_SITE_OTHER): Payer: Medicaid Other | Admitting: Psychiatry

## 2015-07-05 ENCOUNTER — Encounter (HOSPITAL_COMMUNITY): Payer: Self-pay | Admitting: Psychiatry

## 2015-07-05 DIAGNOSIS — F332 Major depressive disorder, recurrent severe without psychotic features: Secondary | ICD-10-CM | POA: Diagnosis not present
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
## 2015-07-05 NOTE — Patient Instructions (Signed)
Discussed orally 

## 2015-07-17 ENCOUNTER — Telehealth (HOSPITAL_COMMUNITY): Payer: Self-pay | Admitting: *Deleted

## 2015-07-18 ENCOUNTER — Ambulatory Visit (HOSPITAL_COMMUNITY): Payer: Self-pay | Admitting: Psychiatry

## 2015-07-19 ENCOUNTER — Ambulatory Visit (HOSPITAL_COMMUNITY): Payer: Self-pay | Admitting: Psychiatry

## 2015-07-27 ENCOUNTER — Encounter (HOSPITAL_COMMUNITY): Payer: Self-pay | Admitting: Psychiatry

## 2015-07-27 ENCOUNTER — Ambulatory Visit (INDEPENDENT_AMBULATORY_CARE_PROVIDER_SITE_OTHER): Payer: Medicaid Other | Admitting: Psychiatry

## 2015-07-27 VITALS — BP 110/78 | Ht 65.0 in | Wt 135.0 lb

## 2015-07-27 DIAGNOSIS — F332 Major depressive disorder, recurrent severe without psychotic features: Secondary | ICD-10-CM

## 2015-07-27 MED ORDER — DULOXETINE HCL 60 MG PO CPEP: 60.0000 mg | ORAL_CAPSULE | Freq: Every day | ORAL | Status: DC

## 2015-07-27 MED ORDER — CLONAZEPAM 1 MG PO TABS: 1.0000 mg | ORAL_TABLET | Freq: Two times a day (BID) | ORAL | Status: DC
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
## 2015-07-31 ENCOUNTER — Encounter: Payer: Medicaid Other | Admitting: *Deleted

## 2015-08-02 ENCOUNTER — Encounter (HOSPITAL_COMMUNITY): Payer: Self-pay | Admitting: Psychiatry

## 2015-08-02 ENCOUNTER — Ambulatory Visit (INDEPENDENT_AMBULATORY_CARE_PROVIDER_SITE_OTHER): Payer: Medicaid Other | Admitting: Psychiatry

## 2015-08-02 DIAGNOSIS — F332 Major depressive disorder, recurrent severe without psychotic features: Secondary | ICD-10-CM
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
## 2015-08-02 NOTE — Patient Instructions (Signed)
Discussed orally 

## 2015-08-24 ENCOUNTER — Encounter (HOSPITAL_COMMUNITY): Payer: Self-pay | Admitting: Psychiatry

## 2015-08-24 ENCOUNTER — Ambulatory Visit (INDEPENDENT_AMBULATORY_CARE_PROVIDER_SITE_OTHER): Payer: Medicaid Other | Admitting: Psychiatry

## 2015-08-24 DIAGNOSIS — F332 Major depressive disorder, recurrent severe without psychotic features: Secondary | ICD-10-CM
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
## 2015-08-24 NOTE — Patient Instructions (Signed)
Discussed orally 

## 2015-09-25 ENCOUNTER — Encounter (HOSPITAL_COMMUNITY): Payer: Self-pay | Admitting: Psychiatry

## 2015-09-25 ENCOUNTER — Ambulatory Visit (INDEPENDENT_AMBULATORY_CARE_PROVIDER_SITE_OTHER): Payer: Medicaid Other | Admitting: Psychiatry

## 2015-09-25 DIAGNOSIS — F332 Major depressive disorder, recurrent severe without psychotic features: Secondary | ICD-10-CM
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
## 2015-09-26 ENCOUNTER — Ambulatory Visit (HOSPITAL_COMMUNITY): Payer: Self-pay | Admitting: Psychiatry

## 2015-10-09 ENCOUNTER — Encounter (HOSPITAL_COMMUNITY): Payer: Self-pay | Admitting: Psychiatry

## 2015-10-09 ENCOUNTER — Ambulatory Visit (INDEPENDENT_AMBULATORY_CARE_PROVIDER_SITE_OTHER): Payer: Medicaid Other | Admitting: Psychiatry

## 2015-10-09 VITALS — BP 129/84 | HR 84 | Ht 65.0 in | Wt 137.0 lb

## 2015-10-09 DIAGNOSIS — F332 Major depressive disorder, recurrent severe without psychotic features: Secondary | ICD-10-CM

## 2015-10-09 MED ORDER — DULOXETINE HCL 60 MG PO CPEP: 60.0000 mg | ORAL_CAPSULE | Freq: Every day | ORAL | 2 refills | Status: DC

## 2015-10-09 MED ORDER — CLONAZEPAM 1 MG PO TABS: 1.0000 mg | ORAL_TABLET | Freq: Two times a day (BID) | ORAL | 2 refills | Status: DC
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
## 2015-10-10 ENCOUNTER — Other Ambulatory Visit: Payer: Self-pay | Admitting: Family Medicine

## 2015-10-15 ENCOUNTER — Encounter: Payer: Self-pay | Admitting: Nurse Practitioner

## 2015-10-15 ENCOUNTER — Ambulatory Visit (INDEPENDENT_AMBULATORY_CARE_PROVIDER_SITE_OTHER): Payer: Medicaid Other | Admitting: Nurse Practitioner

## 2015-10-15 VITALS — BP 134/88 | HR 86 | Temp 98.0°F | Ht 65.0 in | Wt 138.0 lb

## 2015-10-15 DIAGNOSIS — Z8679 Personal history of other diseases of the circulatory system: Secondary | ICD-10-CM

## 2015-10-15 DIAGNOSIS — Z87898 Personal history of other specified conditions: Secondary | ICD-10-CM

## 2015-10-15 DIAGNOSIS — J441 Chronic obstructive pulmonary disease with (acute) exacerbation: Secondary | ICD-10-CM

## 2015-10-15 MED ORDER — BUDESONIDE-FORMOTEROL FUMARATE 160-4.5 MCG/ACT IN AERO: 2.0000 | INHALATION_SPRAY | Freq: Two times a day (BID) | RESPIRATORY_TRACT | 1 refills | Status: DC

## 2015-10-15 NOTE — Addendum Note (Signed)
Addended by: Chevis Pretty on: 10/15/2015 02:35 PM   Modules accepted: Orders

## 2015-10-15 NOTE — Progress Notes (Signed)
   Subjective:    Patient ID: [name], female    DOB: 04/16/1955, 60 y.o.   MRN: [id number]  HPI Patient comes in today c/o skin issues on bil arms and spreading to legs- she say that she can just rub up against something and she will bruise and or skin will peel up. This started over a year ago but is worsening. She has asked other providers about this and they just told her that it was just thin skin.    Review of Systems  Constitutional: Negative.   Respiratory: Negative.   Cardiovascular: Negative.   Genitourinary: Negative.   Neurological: Negative.   Psychiatric/Behavioral: Negative.   All other systems reviewed and are negative.      Objective:   Physical Exam  Constitutional: She appears well-developed and well-nourished. No distress.  Skin: Skin is warm.  Multiple scattered bruises of varying sizes on bil forearms with mutliple scars from superficial skin tears. SOme areas on bil lower ext.  Psychiatric: She has a normal mood and affect. Her behavior is normal. Judgment and thought content normal.    BP 134/88 (BP Location: Left Arm, Cuff Size: Normal)   Pulse 86   Temp 98 F (36.7 C) (Oral)   Ht 5\' 5"  (1.651 m)   Wt 138 lb (62.6 kg)   BMI 22.96 kg/m         Assessment & Plan:    1. History of ecchymosis    Patient has appointment with derm on August 22- keep appointment Labs pending Orders Placed This Encounter  Procedures  . CBC with Differential/Platelet   Mary-Margaret Hassell Done, FNP

## 2015-10-16 ENCOUNTER — Telehealth: Payer: Self-pay

## 2015-10-16 LAB — CBC WITH DIFFERENTIAL/PLATELET
BASOS ABS: 0 10*3/uL (ref 0.0–0.2)
Basos: 0 %
EOS (ABSOLUTE): 0.1 10*3/uL (ref 0.0–0.4)
Eos: 1 %
HEMATOCRIT: 43.1 % (ref 34.0–46.6)
Hemoglobin: 14 g/dL (ref 11.1–15.9)
Immature Grans (Abs): 0 10*3/uL (ref 0.0–0.1)
Immature Granulocytes: 0 %
LYMPHS ABS: 2.6 10*3/uL (ref 0.7–3.1)
Lymphs: 27 %
MCH: 31 pg (ref 26.6–33.0)
MCHC: 32.5 g/dL (ref 31.5–35.7)
MCV: 95 fL (ref 79–97)
MONOS ABS: 0.7 10*3/uL (ref 0.1–0.9)
Monocytes: 7 %
Neutrophils Absolute: 6.3 10*3/uL (ref 1.4–7.0)
Neutrophils: 65 %
PLATELETS: 309 10*3/uL (ref 150–379)
RBC: 4.52 x10E6/uL (ref 3.77–5.28)
RDW: 14.9 % (ref 12.3–15.4)
WBC: 9.8 10*3/uL (ref 3.4–10.8)

## 2015-10-17 NOTE — Telephone Encounter (Signed)
x

## 2015-10-23 ENCOUNTER — Ambulatory Visit (HOSPITAL_COMMUNITY): Payer: Self-pay | Admitting: Psychiatry

## 2015-11-09 ENCOUNTER — Other Ambulatory Visit: Payer: Self-pay | Admitting: Nurse Practitioner

## 2015-11-09 DIAGNOSIS — K219 Gastro-esophageal reflux disease without esophagitis: Secondary | ICD-10-CM

## 2015-11-09 DIAGNOSIS — E785 Hyperlipidemia, unspecified: Secondary | ICD-10-CM

## 2015-11-09 DIAGNOSIS — I10 Essential (primary) hypertension: Secondary | ICD-10-CM

## 2015-11-09 DIAGNOSIS — G4700 Insomnia, unspecified: Secondary | ICD-10-CM

## 2015-11-09 NOTE — Telephone Encounter (Signed)
Last refill without being seen 

## 2015-11-20 ENCOUNTER — Ambulatory Visit (INDEPENDENT_AMBULATORY_CARE_PROVIDER_SITE_OTHER): Payer: Medicaid Other | Admitting: Psychiatry

## 2015-11-20 ENCOUNTER — Encounter (HOSPITAL_COMMUNITY): Payer: Self-pay | Admitting: Psychiatry

## 2015-11-20 DIAGNOSIS — F332 Major depressive disorder, recurrent severe without psychotic features: Secondary | ICD-10-CM
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
## 2015-12-03 ENCOUNTER — Ambulatory Visit (INDEPENDENT_AMBULATORY_CARE_PROVIDER_SITE_OTHER): Payer: Medicaid Other | Admitting: Nurse Practitioner

## 2015-12-03 ENCOUNTER — Encounter: Payer: Self-pay | Admitting: Nurse Practitioner

## 2015-12-03 VITALS — BP 125/77 | HR 97 | Temp 98.5°F | Ht 65.0 in | Wt 144.0 lb

## 2015-12-03 DIAGNOSIS — M542 Cervicalgia: Secondary | ICD-10-CM | POA: Diagnosis not present

## 2015-12-03 DIAGNOSIS — Z23 Encounter for immunization: Secondary | ICD-10-CM

## 2015-12-03 MED ORDER — IBUPROFEN 800 MG PO TABS: 800.0000 mg | ORAL_TABLET | Freq: Three times a day (TID) | ORAL | 0 refills | Status: DC | PRN

## 2015-12-03 NOTE — Patient Instructions (Signed)
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
## 2015-12-07 ENCOUNTER — Other Ambulatory Visit (HOSPITAL_COMMUNITY): Payer: Self-pay | Admitting: Psychiatry

## 2015-12-31 ENCOUNTER — Ambulatory Visit (INDEPENDENT_AMBULATORY_CARE_PROVIDER_SITE_OTHER): Payer: Medicaid Other | Admitting: Psychiatry

## 2015-12-31 ENCOUNTER — Other Ambulatory Visit (HOSPITAL_COMMUNITY): Payer: Self-pay | Admitting: Psychiatry

## 2015-12-31 ENCOUNTER — Other Ambulatory Visit: Payer: Self-pay | Admitting: Nurse Practitioner

## 2015-12-31 ENCOUNTER — Encounter (HOSPITAL_COMMUNITY): Payer: Self-pay | Admitting: Psychiatry

## 2015-12-31 DIAGNOSIS — F332 Major depressive disorder, recurrent severe without psychotic features: Secondary | ICD-10-CM | POA: Diagnosis not present

## 2015-12-31 DIAGNOSIS — K219 Gastro-esophageal reflux disease without esophagitis: Secondary | ICD-10-CM

## 2015-12-31 DIAGNOSIS — I10 Essential (primary) hypertension: Secondary | ICD-10-CM

## 2015-12-31 DIAGNOSIS — E785 Hyperlipidemia, unspecified: Secondary | ICD-10-CM
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
## 2016-01-08 ENCOUNTER — Ambulatory Visit (INDEPENDENT_AMBULATORY_CARE_PROVIDER_SITE_OTHER): Payer: Medicaid Other | Admitting: Psychiatry

## 2016-01-08 ENCOUNTER — Encounter (HOSPITAL_COMMUNITY): Payer: Self-pay | Admitting: Psychiatry

## 2016-01-08 VITALS — BP 136/102 | HR 90 | Ht 65.0 in | Wt 141.8 lb

## 2016-01-08 DIAGNOSIS — Z811 Family history of alcohol abuse and dependence: Secondary | ICD-10-CM

## 2016-01-08 DIAGNOSIS — Z8249 Family history of ischemic heart disease and other diseases of the circulatory system: Secondary | ICD-10-CM | POA: Diagnosis not present

## 2016-01-08 DIAGNOSIS — F17210 Nicotine dependence, cigarettes, uncomplicated: Secondary | ICD-10-CM | POA: Diagnosis not present

## 2016-01-08 DIAGNOSIS — F332 Major depressive disorder, recurrent severe without psychotic features: Secondary | ICD-10-CM | POA: Diagnosis not present

## 2016-01-08 DIAGNOSIS — Z813 Family history of other psychoactive substance abuse and dependence: Secondary | ICD-10-CM

## 2016-01-08 DIAGNOSIS — Z818 Family history of other mental and behavioral disorders: Secondary | ICD-10-CM | POA: Diagnosis not present

## 2016-01-08 MED ORDER — CLONAZEPAM 1 MG PO TABS: 1.0000 mg | ORAL_TABLET | Freq: Two times a day (BID) | ORAL | 2 refills | Status: DC

## 2016-01-08 MED ORDER — DULOXETINE HCL 60 MG PO CPEP: 60.0000 mg | ORAL_CAPSULE | Freq: Every day | ORAL | 2 refills | Status: DC
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
## 2016-02-02 ENCOUNTER — Other Ambulatory Visit: Payer: Self-pay | Admitting: Family Medicine

## 2016-02-02 ENCOUNTER — Other Ambulatory Visit: Payer: Self-pay | Admitting: Nurse Practitioner

## 2016-02-02 DIAGNOSIS — K219 Gastro-esophageal reflux disease without esophagitis: Secondary | ICD-10-CM

## 2016-02-02 DIAGNOSIS — I10 Essential (primary) hypertension: Secondary | ICD-10-CM

## 2016-02-02 DIAGNOSIS — E785 Hyperlipidemia, unspecified: Secondary | ICD-10-CM

## 2016-02-11 ENCOUNTER — Ambulatory Visit (INDEPENDENT_AMBULATORY_CARE_PROVIDER_SITE_OTHER): Payer: Medicaid Other | Admitting: Psychiatry

## 2016-02-11 DIAGNOSIS — F332 Major depressive disorder, recurrent severe without psychotic features: Secondary | ICD-10-CM | POA: Diagnosis not present
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
## 2016-02-28 ENCOUNTER — Other Ambulatory Visit: Payer: Self-pay | Admitting: Nurse Practitioner

## 2016-02-28 DIAGNOSIS — I10 Essential (primary) hypertension: Secondary | ICD-10-CM

## 2016-02-28 DIAGNOSIS — E785 Hyperlipidemia, unspecified: Secondary | ICD-10-CM

## 2016-02-28 DIAGNOSIS — K219 Gastro-esophageal reflux disease without esophagitis: Secondary | ICD-10-CM

## 2016-03-24 ENCOUNTER — Encounter (HOSPITAL_COMMUNITY): Payer: Self-pay | Admitting: Psychiatry

## 2016-03-24 ENCOUNTER — Ambulatory Visit (INDEPENDENT_AMBULATORY_CARE_PROVIDER_SITE_OTHER): Payer: Medicaid Other | Admitting: Psychiatry

## 2016-03-24 DIAGNOSIS — F332 Major depressive disorder, recurrent severe without psychotic features: Secondary | ICD-10-CM
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
## 2016-04-03 ENCOUNTER — Other Ambulatory Visit (HOSPITAL_COMMUNITY): Payer: Self-pay | Admitting: Psychiatry

## 2016-04-24 ENCOUNTER — Ambulatory Visit (HOSPITAL_COMMUNITY): Payer: Self-pay | Admitting: Psychiatry

## 2016-04-30 ENCOUNTER — Other Ambulatory Visit (HOSPITAL_COMMUNITY): Payer: Self-pay | Admitting: Psychiatry

## 2016-05-02 ENCOUNTER — Other Ambulatory Visit: Payer: Self-pay | Admitting: Nurse Practitioner

## 2016-05-02 ENCOUNTER — Other Ambulatory Visit (HOSPITAL_COMMUNITY): Payer: Self-pay | Admitting: Psychiatry

## 2016-05-02 ENCOUNTER — Other Ambulatory Visit: Payer: Self-pay | Admitting: Family Medicine

## 2016-05-02 DIAGNOSIS — K219 Gastro-esophageal reflux disease without esophagitis: Secondary | ICD-10-CM

## 2016-05-02 DIAGNOSIS — I10 Essential (primary) hypertension: Secondary | ICD-10-CM

## 2016-05-02 DIAGNOSIS — E785 Hyperlipidemia, unspecified: Secondary | ICD-10-CM

## 2016-05-06 ENCOUNTER — Telehealth (HOSPITAL_COMMUNITY): Payer: Self-pay | Admitting: *Deleted

## 2016-05-06 NOTE — Telephone Encounter (Signed)
phone call from patient, she said [facility] told her that she can't get her Cymbalta and her clonazePAM until she sees Dr. Harrington Challenger.

## 2016-05-07 NOTE — Telephone Encounter (Signed)
lmtcb

## 2016-05-08 ENCOUNTER — Ambulatory Visit (INDEPENDENT_AMBULATORY_CARE_PROVIDER_SITE_OTHER): Payer: Medicaid Other | Admitting: Nurse Practitioner

## 2016-05-08 ENCOUNTER — Encounter: Payer: Self-pay | Admitting: Nurse Practitioner

## 2016-05-08 ENCOUNTER — Telehealth: Payer: Self-pay

## 2016-05-08 VITALS — BP 109/67 | HR 86 | Temp 98.0°F | Ht 65.0 in | Wt 143.0 lb

## 2016-05-08 DIAGNOSIS — S40021A Contusion of right upper arm, initial encounter: Secondary | ICD-10-CM

## 2016-05-08 MED ORDER — KETOROLAC TROMETHAMINE 60 MG/2ML IM SOLN
60.0000 mg | Freq: Once | INTRAMUSCULAR | Status: AC
  Administered 2016-05-08: 60 mg via INTRAMUSCULAR

## 2016-05-08 NOTE — Patient Instructions (Signed)
Biceps Tendon Disruption (Distal) The distal biceps tendon is a strong cord of tissue that connects the biceps muscle, on the front of the upper arm, to a bone (radius) in the elbow. A distal biceps tendon disruption can interfere with the ability to turn the hand palm-up (supination) and bend (flex) the elbow. This is an uncommon injury that may include one or both of the following:  A complete tear (rupture) in the tendon, causing the tendon to completely separate from the radius. When this happens, the biceps muscle pulls up (retracts) toward the shoulder.  A partial tear in the tendon that causes the tendon to partially separate from the radius. This is less common than a tendon rupture. This condition is commonly treated with surgery, and recovery may take 4-8 months. Your health care provider will decide when it is safe for you to return to contact sports. What are the causes? This condition is usually caused by suddenly straightening the elbow while the biceps muscle is tightened (contracted). This could happen, for example, while lifting or carrying a heavy object that suddenly falls or shifts position. What increases the risk? The following factors may increase your risk of developing this condition:  Being a man who is 80-66 years old.  Smoking.  Using steroids. What are the signs or symptoms? Symptoms of this condition may include:  Feeling a "pop" in the elbow at the time of injury.  Pain, swelling, and bruising in the front of the elbow.  Weakness in the arm when doing certain movements, such as:  Lifting or carrying objects.  Rotating the forearm, such as when you turn a screwdriver.  Bending the elbow.  A bulge in the upper arm. You may feel this in the front of the arm, the inside of the arm, or both.  Limited range of motion of the elbow and forearm. How is this diagnosed? This condition is diagnosed based on your symptoms, your medical history, and a physical  exam. Your health care provider may test your range of motion by having you do arm movements. You may have tests, including:  X-rays.  Ultrasound. This uses sound waves to make an image of the affected area.  MRI. How is this treated? Treatment for this condition usually includes one or more of the following:  Resting from sports and intense physical activity.  Physical therapy.  Surgery to reattach your tendon to your radius. This is the most common treatment method. Your elbow will be kept in place (immobilized) with a cast immediately after surgery.  A splint or brace to immobilize your elbow. If you had surgery and you received a cast after surgery, you will get a splint or brace to replace your cast when you start physical therapy. In some cases, no treatment may be needed for this condition. Follow these instructions at home: If you have a splint or brace:   Wear the splint or brace as told by your health care provider. Remove it only as told by your health care provider.  Loosen the splint or brace if your fingers tingle, become numb, or turn cold and blue.  Do not let your splint or brace get wet if it is not waterproof.  If you have a splint or brace, do not take baths, swim, or use a hot tub until your health care provider approves. Ask your health care provider if you can take showers. You may only be allowed to take sponge baths for bathing.  If your splint  or brace is not waterproof, cover it with a watertight covering when you take a bath or a shower.  Keep the splint or brace clean. If you have a cast:   Do not stick anything inside the cast to scratch your skin. Doing that increases your risk of infection.  Check the skin around the cast every day. Tell your health care provider about any concerns.  You may put lotion on dry skin around the edges of the cast. Do not put lotion on the skin underneath the cast.  Keep the cast clean.  If the cast is not  waterproof:  Do not let it get wet.  Cover it with a watertight covering when you take a bath or a shower. Managing pain, stiffness, and swelling   If directed, put ice on the injured area:  Put ice in a plastic bag.  Place a towel between your skin and the bag.  Leave the ice on for 20 minutes, 2-3 times a day.  If directed, apply heat to the affected area before you exercise or as often as told by your health care provider. Use the heat source that your health care provider recommends, such as a moist heat pack or a heating pad.  Place a towel between your skin and the heat source.  Leave the heat on for 20-30 minutes.  Remove the heat if your skin turns bright red. This is especially important if you are unable to feel pain, heat, or cold. You may have a greater risk of getting burned.  Move your fingers often to avoid stiffness and to lessen swelling.  Raise (elevate) the injured area above the level of your heart while you are sitting or lying down. Driving   Do not drive or operate heavy machinery while taking prescription pain medicine.  Ask your health care provider when it is safe to drive if you have a cast, brace, splint, or sling on your arm. Activity   Return to your normal activities as told by your health care provider. Ask your health care provider what activities are safe for you.  Avoid activities that cause pain or make your condition worse.  Do not participate in contact sports until your health care provider approves.  Do not lift or carry anything with your injured arm until your health care provider approves.  Do exercises as told by your health care provider. Safety   Do not use the affected limb to support your body weight until your health care provider says that you can. General instructions   Take over-the-counter and prescription medicines only as told by your health care provider.  If you have a cast or splint, do not put pressure on any  part of the cast or splint until it is fully hardened. This may take several hours.  Keep all follow-up visits as told by your health care provider. This is important. Contact a health care provider if:  Your cast, splint, or brace causes pain or numbness. Get help right away if:  You develop severe pain.  You develop numbness or tingling in your hand.  Your hand feels unusually cold.  Your fingernails turn a dark color, such as blue or gray. This information is not intended to replace advice given to you by your health care provider. Make sure you discuss any questions you have with your health care provider. Document Released: 02/17/2005 Document Revised: 10/25/2015 Document Reviewed: 11/12/2014 Elsevier Interactive Patient Education  2017 [employer].

## 2016-05-08 NOTE — Telephone Encounter (Signed)
Pt notified she will need to be seen here first appt scheduled

## 2016-05-08 NOTE — Progress Notes (Signed)
   Subjective:    Patient ID: [name], female    DOB: 1956-01-12, 61 y.o.   MRN: [id number]  HPI Patient says that she was pulling her sister in law uo in bed MOnday and felt a popping sensation in right upper arm. By the next morning area was brusied with a knot. Unable to lift her arm. Rates pain 6/10 which increases to 10/10 with movement.    Review of Systems  Constitutional: Negative.   HENT: Negative.   Respiratory: Negative.   Cardiovascular: Negative.   Genitourinary: Negative.   Neurological: Negative.   Psychiatric/Behavioral: Negative.   All other systems reviewed and are negative.      Objective:   Physical Exam  Constitutional: She appears well-developed and well-nourished. She appears distressed (mild).  Cardiovascular: Normal rate.   Pulmonary/Chest: Effort normal.  Musculoskeletal:  5X6 CM annular multi colored contusion to right lower biceps area. Hard and tender to touch. Limited ROM of right elbow and shoulder due to pain .  Neurological: She is alert.  Psychiatric: She has a normal mood and affect. Her behavior is normal. Judgment and thought content normal.   BP 109/67   Pulse 86   Temp 98 F (36.7 C) (Oral)   Ht 5\' 5"  (1.651 m)   Wt 143 lb (64.9 kg)   BMI 23.80 kg/m         Assessment & Plan:  1. Arm contusion, right, initial encounter rest arm Will call with MRI results - MR MRA UPPER EXTREMITY RIGHT WO CONTRAST; Future - ketorolac (TORADOL) injection 60 mg; Inject 2 mLs (60 mg total) into the muscle once.  Mary-Margaret Hassell Done, FNP

## 2016-05-08 NOTE — Telephone Encounter (Signed)
You may call in 30 days of each

## 2016-05-08 NOTE — Telephone Encounter (Signed)
Pt pharmacy requesting refills for pt Klonopin and Cymbalta two days ago. Called pt pharmacy [facility] and told them to have pt call office to sch an appt due to pt have not seen Dr. Harrington Challenger since 01-08-16 and she needs an appt. Pt called back and resch appt for 05-20-16 Per pt, she would like to know if Dr. Harrington Challenger could refill her Klonopin and Cymbalta. Informed pt that message will be sent to Dr. Harrington Challenger. Pt verbalized understanding.

## 2016-05-09 ENCOUNTER — Encounter: Payer: Self-pay | Admitting: Family Medicine

## 2016-05-09 ENCOUNTER — Other Ambulatory Visit: Payer: Self-pay | Admitting: Nurse Practitioner

## 2016-05-09 DIAGNOSIS — S40021A Contusion of right upper arm, initial encounter: Secondary | ICD-10-CM

## 2016-05-12 ENCOUNTER — Telehealth (HOSPITAL_COMMUNITY): Payer: Self-pay | Admitting: *Deleted

## 2016-05-12 MED ORDER — CLONAZEPAM 1 MG PO TABS: 1.0000 mg | ORAL_TABLET | Freq: Two times a day (BID) | ORAL | 0 refills | Status: DC

## 2016-05-12 MED ORDER — DULOXETINE HCL 60 MG PO CPEP: 60.0000 mg | ORAL_CAPSULE | Freq: Every day | ORAL | 0 refills | Status: DC

## 2016-05-12 NOTE — Telephone Encounter (Signed)
Called in pt refill

## 2016-05-12 NOTE — Telephone Encounter (Signed)
Called pt pharmacy and spoke with the pharmacist. Informed him that Dr. Harrington Challenger would like refills for pt Klonopin and Cymbalta.

## 2016-05-15 ENCOUNTER — Other Ambulatory Visit: Payer: Self-pay

## 2016-05-15 ENCOUNTER — Ambulatory Visit (INDEPENDENT_AMBULATORY_CARE_PROVIDER_SITE_OTHER): Payer: Medicaid Other | Admitting: Psychiatry

## 2016-05-15 DIAGNOSIS — S4991XA Unspecified injury of right shoulder and upper arm, initial encounter: Secondary | ICD-10-CM

## 2016-05-15 DIAGNOSIS — F332 Major depressive disorder, recurrent severe without psychotic features: Secondary | ICD-10-CM | POA: Diagnosis not present
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
## 2016-05-20 ENCOUNTER — Ambulatory Visit (HOSPITAL_COMMUNITY): Payer: Self-pay | Admitting: Psychiatry

## 2016-05-26 ENCOUNTER — Ambulatory Visit (INDEPENDENT_AMBULATORY_CARE_PROVIDER_SITE_OTHER): Payer: Medicaid Other | Admitting: Psychiatry

## 2016-05-26 ENCOUNTER — Encounter (HOSPITAL_COMMUNITY): Payer: Self-pay | Admitting: Psychiatry

## 2016-05-26 VITALS — BP 104/76 | HR 80 | Ht 65.0 in | Wt 140.0 lb

## 2016-05-26 DIAGNOSIS — Z79899 Other long term (current) drug therapy: Secondary | ICD-10-CM

## 2016-05-26 DIAGNOSIS — Z811 Family history of alcohol abuse and dependence: Secondary | ICD-10-CM

## 2016-05-26 DIAGNOSIS — F17210 Nicotine dependence, cigarettes, uncomplicated: Secondary | ICD-10-CM | POA: Diagnosis not present

## 2016-05-26 DIAGNOSIS — Z888 Allergy status to other drugs, medicaments and biological substances status: Secondary | ICD-10-CM | POA: Diagnosis not present

## 2016-05-26 DIAGNOSIS — F332 Major depressive disorder, recurrent severe without psychotic features: Secondary | ICD-10-CM | POA: Diagnosis not present

## 2016-05-26 DIAGNOSIS — Z818 Family history of other mental and behavioral disorders: Secondary | ICD-10-CM | POA: Diagnosis not present

## 2016-05-26 DIAGNOSIS — Z813 Family history of other psychoactive substance abuse and dependence: Secondary | ICD-10-CM

## 2016-05-26 MED ORDER — CLONAZEPAM 1 MG PO TABS: 1.0000 mg | ORAL_TABLET | Freq: Two times a day (BID) | ORAL | 2 refills | Status: DC

## 2016-05-26 MED ORDER — DULOXETINE HCL 60 MG PO CPEP: 60.0000 mg | ORAL_CAPSULE | Freq: Every day | ORAL | 2 refills | Status: DC
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
## 2016-06-17 ENCOUNTER — Encounter (HOSPITAL_COMMUNITY): Payer: Self-pay | Admitting: Psychiatry

## 2016-06-17 ENCOUNTER — Ambulatory Visit (INDEPENDENT_AMBULATORY_CARE_PROVIDER_SITE_OTHER): Payer: Medicaid Other | Admitting: Psychiatry

## 2016-06-17 DIAGNOSIS — F332 Major depressive disorder, recurrent severe without psychotic features: Secondary | ICD-10-CM
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
## 2016-06-19 ENCOUNTER — Ambulatory Visit (INDEPENDENT_AMBULATORY_CARE_PROVIDER_SITE_OTHER): Payer: Medicaid Other

## 2016-06-19 ENCOUNTER — Ambulatory Visit (INDEPENDENT_AMBULATORY_CARE_PROVIDER_SITE_OTHER): Payer: Medicaid Other | Admitting: Nurse Practitioner

## 2016-06-19 ENCOUNTER — Encounter: Payer: Self-pay | Admitting: Nurse Practitioner

## 2016-06-19 VITALS — BP 134/75 | HR 80 | Temp 98.0°F | Ht 65.0 in | Wt 142.0 lb

## 2016-06-19 DIAGNOSIS — A599 Trichomoniasis, unspecified: Secondary | ICD-10-CM

## 2016-06-19 DIAGNOSIS — R109 Unspecified abdominal pain: Secondary | ICD-10-CM

## 2016-06-19 DIAGNOSIS — N898 Other specified noninflammatory disorders of vagina: Secondary | ICD-10-CM

## 2016-06-19 DIAGNOSIS — K5900 Constipation, unspecified: Secondary | ICD-10-CM | POA: Diagnosis not present

## 2016-06-19 LAB — MICROSCOPIC EXAMINATION
Epithelial Cells (non renal): >10 /hpf — AB (ref 0–10)
Renal Epithel, UA: NONE SEEN /hpf

## 2016-06-19 LAB — URINALYSIS, COMPLETE
BILIRUBIN UA: NEGATIVE
Glucose, UA: NEGATIVE
NITRITE UA: NEGATIVE
Protein, UA: NEGATIVE
RBC UA: NEGATIVE
SPEC GRAV UA: 1.025 (ref 1.005–1.030)
UUROB: 1 mg/dL (ref 0.2–1.0)
pH, UA: 5.5 (ref 5.0–7.5)

## 2016-06-19 LAB — WET PREP FOR TRICH, YEAST, CLUE
CLUE CELL EXAM: NEGATIVE
TRICHOMONAS EXAM: POSITIVE — AB
YEAST EXAM: NEGATIVE

## 2016-06-19 MED ORDER — METRONIDAZOLE 500 MG PO TABS: ORAL_TABLET | ORAL | 0 refills | Status: DC

## 2016-06-19 NOTE — Addendum Note (Signed)
Addended by: Chevis Pretty on: 06/19/2016 11:34 AM   Modules accepted: Orders

## 2016-06-19 NOTE — Patient Instructions (Signed)

## 2016-06-19 NOTE — Progress Notes (Signed)
   Subjective:    Patient ID: [name], female    DOB: Jul 05, 1955, 61 y.o.   MRN: [id number]  HPI Patient comes in today with 2 complaints: - constipation for 3 weeks- has tried miralax and laxative without good results- rates abdominal pain 5/10 currently but can increase to 8/10 at times. Denies fever, nausea or vomiting. - vaginal discharge earlier in week- was brown and foul smelling- used monistat OTC and now discharge is white- denies itching    Review of Systems  Constitutional: Positive for appetite change. Negative for fever.  Respiratory: Negative.   Cardiovascular: Negative.   Gastrointestinal: Positive for abdominal pain and constipation. Negative for diarrhea, nausea, rectal pain and vomiting.  Genitourinary: Negative.   Neurological: Negative.   Psychiatric/Behavioral: Negative.   All other systems reviewed and are negative.      Objective:   Physical Exam  Constitutional: She appears well-developed and well-nourished. No distress.  Cardiovascular: Normal rate.   Pulmonary/Chest: Effort normal and breath sounds normal.  Abdominal: Soft. There is tenderness (lower abdomen bilaterally).  Neurological: She is alert.  Skin: Skin is warm.  Psychiatric: She has a normal mood and affect. Her behavior is normal. Judgment and thought content normal.   BP 134/75   Pulse 80   Temp 98 F (36.7 C) (Oral)   Ht 5\' 5"  (1.651 m)   Wt 142 lb (64.4 kg)   BMI 23.63 kg/m   KUB- significant stool burden-Preliminary reading by Ronnald Collum, FNP  [facility]  Wet prep- trich  Urine- trich otherwise ok    Assessment & Plan:  1. Abdominal pain, unspecified abdominal location - DG Abd 1 View; Future - Urinalysis, Complete  2. Constipation, unspecified constipation type Mag citrate OTC- if no result sin 6 hours let me know  3. Trichimoniasis Safe sex discussed - metroNIDAZOLE (FLAGYL) 500 MG tablet; 4 tablets po NOW  Dispense: 4 tablet; Refill: 0  Mary-Margaret Hassell Done,  FNP

## 2016-06-20 ENCOUNTER — Telehealth: Payer: Self-pay | Admitting: Family Medicine

## 2016-06-20 NOTE — Telephone Encounter (Signed)
Patient aware of recommendations.  

## 2016-06-20 NOTE — Telephone Encounter (Signed)
TRY A DOSE OF MILK OF MANESIA AND PRUNE JUICE-slight abdominal pain- if continue to swell and hurt need to go to ER

## 2016-06-23 ENCOUNTER — Telehealth: Payer: Self-pay | Admitting: Family Medicine

## 2016-06-23 NOTE — Telephone Encounter (Signed)
Pt wanted to re-clarify information that Mary-Martin gave her at visit last week.

## 2016-07-08 ENCOUNTER — Encounter: Payer: Self-pay | Admitting: Psychiatry

## 2016-07-22 ENCOUNTER — Other Ambulatory Visit: Payer: Self-pay | Admitting: Nurse Practitioner

## 2016-07-23 ENCOUNTER — Ambulatory Visit (INDEPENDENT_AMBULATORY_CARE_PROVIDER_SITE_OTHER): Payer: Medicaid Other | Admitting: Family

## 2016-07-23 ENCOUNTER — Encounter: Payer: Self-pay | Admitting: Family

## 2016-07-23 ENCOUNTER — Ambulatory Visit (INDEPENDENT_AMBULATORY_CARE_PROVIDER_SITE_OTHER): Payer: Medicaid Other | Admitting: Psychiatry

## 2016-07-23 VITALS — BP 136/76 | HR 84 | Temp 97.6°F | Ht 65.0 in | Wt 143.2 lb

## 2016-07-23 DIAGNOSIS — N760 Acute vaginitis: Secondary | ICD-10-CM | POA: Diagnosis not present

## 2016-07-23 DIAGNOSIS — Z202 Contact with and (suspected) exposure to infections with a predominantly sexual mode of transmission: Secondary | ICD-10-CM | POA: Diagnosis not present

## 2016-07-23 DIAGNOSIS — N898 Other specified noninflammatory disorders of vagina: Secondary | ICD-10-CM

## 2016-07-23 DIAGNOSIS — B9689 Other specified bacterial agents as the cause of diseases classified elsewhere: Secondary | ICD-10-CM

## 2016-07-23 DIAGNOSIS — F332 Major depressive disorder, recurrent severe without psychotic features: Secondary | ICD-10-CM

## 2016-07-23 LAB — WET PREP FOR TRICH, YEAST, CLUE
Clue Cell Exam: POSITIVE — AB
TRICHOMONAS EXAM: NEGATIVE
YEAST EXAM: NEGATIVE

## 2016-07-23 LAB — URINALYSIS, COMPLETE
Bilirubin, UA: NEGATIVE
Glucose, UA: NEGATIVE
NITRITE UA: NEGATIVE
PH UA: 5.5 (ref 5.0–7.5)
RBC, UA: NEGATIVE
Specific Gravity, UA: >1.03 — ABNORMAL HIGH (ref 1.005–1.030)
UUROB: 1 mg/dL (ref 0.2–1.0)

## 2016-07-23 LAB — MICROSCOPIC EXAMINATION
RBC, UA: NONE SEEN /hpf (ref 0–?)
Renal Epithel, UA: NONE SEEN /hpf

## 2016-07-23 MED ORDER — METRONIDAZOLE 500 MG PO TABS: 500.0000 mg | ORAL_TABLET | Freq: Two times a day (BID) | ORAL | 0 refills | Status: DC
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
## 2016-07-23 NOTE — Patient Instructions (Signed)
Bacterial Vaginosis Bacterial vaginosis is a vaginal infection that occurs when the normal balance of bacteria in the vagina is disrupted. It results from an overgrowth of certain bacteria. This is the most common vaginal infection among women ages 15-44. Because bacterial vaginosis increases your risk for STIs (sexually transmitted infections), getting treated can help reduce your risk for chlamydia, gonorrhea, herpes, and HIV (human immunodeficiency virus). Treatment is also important for preventing complications in pregnant women, because this condition can cause an early (premature) delivery. What are the causes? This condition is caused by an increase in harmful bacteria that are normally present in small amounts in the vagina. However, the reason that the condition develops is not fully understood. What increases the risk? The following factors may make you more likely to develop this condition:  Having a new sexual partner or multiple sexual partners.  Having unprotected sex.  Douching.  Having an intrauterine device (IUD).  Smoking.  Drug and alcohol abuse.  Taking certain antibiotic medicines.  Being pregnant.  You cannot get bacterial vaginosis from toilet seats, bedding, swimming pools, or contact with objects around you. What are the signs or symptoms? Symptoms of this condition include:  Grey or white vaginal discharge. The discharge can also be watery or foamy.  A fish-like odor with discharge, especially after sexual intercourse or during menstruation.  Itching in and around the vagina.  Burning or pain with urination.  Some women with bacterial vaginosis have no signs or symptoms. How is this diagnosed? This condition is diagnosed based on:  Your medical history.  A physical exam of the vagina.  Testing a sample of vaginal fluid under a microscope to look for a large amount of bad bacteria or abnormal cells. Your health care provider may use a cotton swab  or a small wooden spatula to collect the sample.  How is this treated? This condition is treated with antibiotics. These may be given as a pill, a vaginal cream, or a medicine that is put into the vagina (suppository). If the condition comes back after treatment, a second round of antibiotics may be needed. Follow these instructions at home: Medicines  Take over-the-counter and prescription medicines only as told by your health care provider.  Take or use your antibiotic as told by your health care provider. Do not stop taking or using the antibiotic even if you start to feel better. General instructions  If you have a female sexual partner, tell her that you have a vaginal infection. She should see her health care provider and be treated if she has symptoms. If you have a female sexual partner, he does not need treatment.  During treatment: ? Avoid sexual activity until you finish treatment. ? Do not douche. ? Avoid alcohol as directed by your health care provider. ? Avoid breastfeeding as directed by your health care provider.  Drink enough water and fluids to keep your urine clear or pale yellow.  Keep the area around your vagina and rectum clean. ? Wash the area daily with warm water. ? Wipe yourself from front to back after using the toilet.  Keep all follow-up visits as told by your health care provider. This is important. How is this prevented?  Do not douche.  Wash the outside of your vagina with warm water only.  Use protection when having sex. This includes latex condoms and dental dams.  Limit how many sexual partners you have. To help prevent bacterial vaginosis, it is best to have sex with just   one partner (monogamous).  Make sure you and your sexual partner are tested for STIs.  Wear cotton or cotton-lined underwear.  Avoid wearing tight pants and pantyhose, especially during summer.  Limit the amount of alcohol that you drink.  Do not use any products that  contain nicotine or tobacco, such as cigarettes and e-cigarettes. If you need help quitting, ask your health care provider.  Do not use illegal drugs. Where to find more information:  Centers for Disease Control and Prevention: www.cdc.gov/std  American Sexual Health Association (ASHA): www.ashastd.org  [place] Department of Health and Human Services, Office on Women's Health: www.womenshealth.gov/ or https://www.womenshealth.gov/a-z-topics/bacterial-vaginosis Contact a health care provider if:  Your symptoms do not improve, even after treatment.  You have more discharge or pain when urinating.  You have a fever.  You have pain in your abdomen.  You have pain during sex.  You have vaginal bleeding between periods. Summary  Bacterial vaginosis is a vaginal infection that occurs when the normal balance of bacteria in the vagina is disrupted.  Because bacterial vaginosis increases your risk for STIs (sexually transmitted infections), getting treated can help reduce your risk for chlamydia, gonorrhea, herpes, and HIV (human immunodeficiency virus). Treatment is also important for preventing complications in pregnant women, because the condition can cause an early (premature) delivery.  This condition is treated with antibiotic medicines. These may be given as a pill, a vaginal cream, or a medicine that is put into the vagina (suppository). This information is not intended to replace advice given to you by your health care provider. Make sure you discuss any questions you have with your health care provider. Document Released: 02/17/2005 Document Revised: 11/03/2015 Document Reviewed: 11/03/2015 Elsevier Interactive Patient Education  2017 [employer]  

## 2016-07-23 NOTE — Progress Notes (Signed)
Subjective:    Patient ID: [name], female    DOB: 1956-02-17, 61 y.o.   MRN: [id number]  PT presents to the office today with recurrent vaginal discharge. Pt was seen in the office on 06/19/16 and diagnosed with trichomoniasis. Pt was treated and states her discharge improved, but continues to have discharge at this time.  Pt has several questions on how she got trichomoniasis if she has only been with her husband.  Vaginal Discharge  The patient's primary symptoms include a genital odor and vaginal discharge. The patient's pertinent negatives include no genital itching. This is a recurrent problem. The current episode started 1 to 4 weeks ago. The problem occurs intermittently. The problem has been waxing and waning. The patient is experiencing no pain. Associated symptoms include back pain, dysuria (at times), frequency, nausea and urgency. Pertinent negatives include no abdominal pain, chills, hematuria, painful intercourse or sore throat. The vaginal discharge was clear. There has been no bleeding. She has tried antibiotics for the symptoms. The treatment provided mild relief.  Cough  This is a new problem. The current episode started 1 to 4 weeks ago. The problem has been gradually worsening. The problem occurs every few minutes. The cough is productive of sputum. Associated symptoms include nasal congestion, postnasal drip and rhinorrhea. Pertinent negatives include no chills, ear congestion, ear pain or sore throat. Risk factors for lung disease include smoking/tobacco exposure. She has tried rest and OTC cough suppressant for the symptoms. The treatment provided mild relief.      Review of Systems  Constitutional: Negative for chills.  HENT: Positive for postnasal drip and rhinorrhea. Negative for ear pain and sore throat.   Respiratory: Positive for cough.   Gastrointestinal: Positive for nausea. Negative for abdominal pain.  Genitourinary: Positive for dysuria (at times),  frequency, urgency and vaginal discharge. Negative for hematuria.  Musculoskeletal: Positive for back pain.  All other systems reviewed and are negative.      Objective:   Physical Exam  Constitutional: She is oriented to person, place, and time. She appears well-developed and well-nourished. No distress.  HENT:  Head: Normocephalic and atraumatic.  Right Ear: External ear normal.  Mouth/Throat: Oropharynx is clear and moist.  Eyes: Pupils are equal, round, and reactive to light.  Neck: Normal range of motion. Neck supple. No thyromegaly present.  Cardiovascular: Normal rate, regular rhythm, normal heart sounds and intact distal pulses.   No murmur heard. Pulmonary/Chest: Effort normal and breath sounds normal. No respiratory distress. She has no wheezes.  Abdominal: Soft. Bowel sounds are normal. She exhibits no distension. There is no tenderness.  Musculoskeletal: Normal range of motion. She exhibits no edema or tenderness.  Neurological: She is alert and oriented to person, place, and time. She has normal reflexes. No cranial nerve deficit.  Skin: Skin is warm and dry.  Psychiatric: She has a normal mood and affect. Her behavior is normal. Judgment and thought content normal.  Vitals reviewed.    BP 136/76   Pulse 84   Temp 97.6 F (36.4 C) (Oral)   Ht '5\' 5"'$  (1.651 m)   Wt 143 lb 3.2 oz (65 kg)   BMI 23.83 kg/m '    Assessment & Plan:  1. Vaginal discharge - WET PREP FOR TRICH, YEAST, CLUE - CMP14+EGFR - Urinalysis, Complete  2. Exposure to STD -Safe sex discussed - GC/Chlamydia Probe Amp - CMP14+EGFR - STD Screen (8) - Urinalysis, Complete  3. Bacterial vaginosis -Keep clean and dry  Probiotic  - metroNIDAZOLE (FLAGYL) 500 MG tablet; Take 1 tablet (500 mg total) by mouth 2 (two) times daily.  Dispense: 14 tablet; Refill: 0   Evelina Dun, FNP

## 2016-07-24 LAB — CMP14+EGFR
ALBUMIN: 4 g/dL (ref 3.6–4.8)
ALT: 11 IU/L (ref 0–32)
AST: 10 IU/L (ref 0–40)
Albumin/Globulin Ratio: 2 (ref 1.2–2.2)
Alkaline Phosphatase: 81 IU/L (ref 39–117)
BILIRUBIN TOTAL: 0.2 mg/dL (ref 0.0–1.2)
BUN / CREAT RATIO: 22 (ref 12–28)
BUN: 16 mg/dL (ref 8–27)
CHLORIDE: 106 mmol/L (ref 96–106)
CO2: 25 mmol/L (ref 18–29)
CREATININE: 0.74 mg/dL (ref 0.57–1.00)
Calcium: 9 mg/dL (ref 8.7–10.3)
GFR calc non Af Amer: 86 mL/min/{1.73_m2} (ref >59)
GFR, EST AFRICAN AMERICAN: 100 mL/min/{1.73_m2} (ref >59)
Globulin, Total: 2 g/dL (ref 1.5–4.5)
Glucose: 81 mg/dL (ref 65–99)
Potassium: 4.4 mmol/L (ref 3.5–5.2)
Sodium: 143 mmol/L (ref 134–144)
TOTAL PROTEIN: 6 g/dL (ref 6.0–8.5)

## 2016-07-24 LAB — STD SCREEN (8)
HEP A IGM: NEGATIVE
HEP B C IGM: NEGATIVE
HIV Screen 4th Generation wRfx: NONREACTIVE
HSV 1 GLYCOPROTEIN G AB, IGG: 57 {index} — AB (ref 0.00–0.90)
Hepatitis B Surface Ag: NEGATIVE
RPR: NONREACTIVE

## 2016-07-24 LAB — GC/CHLAMYDIA PROBE AMP

## 2016-07-30 ENCOUNTER — Other Ambulatory Visit: Payer: Self-pay | Admitting: Dermatology

## 2016-08-11 ENCOUNTER — Other Ambulatory Visit (HOSPITAL_COMMUNITY): Payer: Self-pay | Admitting: Psychiatry

## 2016-08-12 ENCOUNTER — Encounter (HOSPITAL_COMMUNITY): Payer: Self-pay | Admitting: Psychiatry

## 2016-08-12 ENCOUNTER — Ambulatory Visit (INDEPENDENT_AMBULATORY_CARE_PROVIDER_SITE_OTHER): Payer: Medicaid Other | Admitting: Psychiatry

## 2016-08-12 VITALS — BP 146/90 | HR 89 | Ht 65.0 in | Wt 144.0 lb

## 2016-08-12 DIAGNOSIS — F17210 Nicotine dependence, cigarettes, uncomplicated: Secondary | ICD-10-CM | POA: Diagnosis not present

## 2016-08-12 DIAGNOSIS — Z818 Family history of other mental and behavioral disorders: Secondary | ICD-10-CM

## 2016-08-12 DIAGNOSIS — Z813 Family history of other psychoactive substance abuse and dependence: Secondary | ICD-10-CM | POA: Diagnosis not present

## 2016-08-12 DIAGNOSIS — F332 Major depressive disorder, recurrent severe without psychotic features: Secondary | ICD-10-CM

## 2016-08-12 MED ORDER — CLONAZEPAM 1 MG PO TABS: 1.0000 mg | ORAL_TABLET | Freq: Two times a day (BID) | ORAL | 2 refills | Status: DC

## 2016-08-12 MED ORDER — DULOXETINE HCL 60 MG PO CPEP: 60.0000 mg | ORAL_CAPSULE | Freq: Every day | ORAL | 2 refills | Status: DC
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
## 2016-08-18 ENCOUNTER — Ambulatory Visit (HOSPITAL_COMMUNITY): Payer: Self-pay | Admitting: Psychiatry

## 2016-08-23 ENCOUNTER — Ambulatory Visit (INDEPENDENT_AMBULATORY_CARE_PROVIDER_SITE_OTHER): Payer: Medicaid Other | Admitting: Physician Assistant

## 2016-08-23 VITALS — BP 118/79 | HR 84 | Temp 98.2°F | Resp 5 | Ht 60.0 in | Wt 144.2 lb

## 2016-08-23 DIAGNOSIS — R102 Pelvic and perineal pain: Secondary | ICD-10-CM

## 2016-08-23 DIAGNOSIS — N898 Other specified noninflammatory disorders of vagina: Secondary | ICD-10-CM

## 2016-08-23 MED ORDER — CIPROFLOXACIN HCL 500 MG PO TABS: 500.0000 mg | ORAL_TABLET | Freq: Two times a day (BID) | ORAL | 0 refills | Status: DC

## 2016-08-23 MED ORDER — AZITHROMYCIN 250 MG PO TABS: ORAL_TABLET | ORAL | 0 refills | Status: DC

## 2016-08-23 MED ORDER — GEMIFLOXACIN MESYLATE 320 MG PO TABS: 320.0000 mg | ORAL_TABLET | Freq: Every day | ORAL | 0 refills | Status: DC

## 2016-08-23 NOTE — Patient Instructions (Signed)
In a few days you may receive a survey in the mail or online from [employer] regarding your visit with us today. Please take a moment to fill this out. Your feedback is very important to our whole office. It can help us better understand your needs as well as improve your experience and satisfaction. Thank you for taking your time to complete it. We care about you.  Rahaf Carbonell, PA-C  

## 2016-08-25 ENCOUNTER — Telehealth: Payer: Self-pay

## 2016-08-25 ENCOUNTER — Ambulatory Visit (HOSPITAL_COMMUNITY): Payer: Self-pay | Admitting: Psychiatry

## 2016-08-25 ENCOUNTER — Other Ambulatory Visit: Payer: Self-pay | Admitting: Family Medicine

## 2016-08-25 DIAGNOSIS — K219 Gastro-esophageal reflux disease without esophagitis: Secondary | ICD-10-CM

## 2016-08-25 DIAGNOSIS — E785 Hyperlipidemia, unspecified: Secondary | ICD-10-CM

## 2016-08-25 DIAGNOSIS — I10 Essential (primary) hypertension: Secondary | ICD-10-CM

## 2016-08-25 NOTE — Telephone Encounter (Signed)
Medicaid wont cover Factive 320 mg
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
## 2016-08-25 NOTE — Telephone Encounter (Signed)
I know, I sent another med on Saturday.

## 2016-08-26 LAB — GC/CHLAMYDIA PROBE AMP
Chlamydia trachomatis, NAA: NEGATIVE
NEISSERIA GONORRHOEAE BY PCR: NEGATIVE

## 2016-08-29 LAB — TRICHOMONAS CULTURE

## 2016-08-29 LAB — WET PREP, GENITAL
CLUE CELL EXAM: NEGATIVE
Trichomonas Exam: NEGATIVE
Yeast Exam: NEGATIVE

## 2016-09-01 ENCOUNTER — Encounter: Payer: Self-pay | Admitting: Nurse Practitioner

## 2016-09-01 ENCOUNTER — Ambulatory Visit (INDEPENDENT_AMBULATORY_CARE_PROVIDER_SITE_OTHER): Payer: Medicaid Other | Admitting: Nurse Practitioner

## 2016-09-01 VITALS — BP 111/69 | HR 82 | Temp 98.2°F | Ht 60.0 in | Wt 142.0 lb

## 2016-09-01 DIAGNOSIS — J441 Chronic obstructive pulmonary disease with (acute) exacerbation: Secondary | ICD-10-CM | POA: Diagnosis not present

## 2016-09-01 DIAGNOSIS — F411 Generalized anxiety disorder: Secondary | ICD-10-CM

## 2016-09-01 DIAGNOSIS — E782 Mixed hyperlipidemia: Secondary | ICD-10-CM | POA: Diagnosis not present

## 2016-09-01 DIAGNOSIS — F17200 Nicotine dependence, unspecified, uncomplicated: Secondary | ICD-10-CM | POA: Diagnosis not present

## 2016-09-01 DIAGNOSIS — Z113 Encounter for screening for infections with a predominantly sexual mode of transmission: Secondary | ICD-10-CM

## 2016-09-01 DIAGNOSIS — I10 Essential (primary) hypertension: Secondary | ICD-10-CM | POA: Diagnosis not present

## 2016-09-01 DIAGNOSIS — F332 Major depressive disorder, recurrent severe without psychotic features: Secondary | ICD-10-CM | POA: Diagnosis not present

## 2016-09-01 DIAGNOSIS — F5101 Primary insomnia: Secondary | ICD-10-CM | POA: Diagnosis not present

## 2016-09-01 DIAGNOSIS — Z8619 Personal history of other infectious and parasitic diseases: Secondary | ICD-10-CM

## 2016-09-01 DIAGNOSIS — K219 Gastro-esophageal reflux disease without esophagitis: Secondary | ICD-10-CM

## 2016-09-01 DIAGNOSIS — J439 Emphysema, unspecified: Secondary | ICD-10-CM

## 2016-09-01 DIAGNOSIS — E559 Vitamin D deficiency, unspecified: Secondary | ICD-10-CM

## 2016-09-01 MED ORDER — ATORVASTATIN CALCIUM 40 MG PO TABS: 40.0000 mg | ORAL_TABLET | Freq: Every day | ORAL | 1 refills | Status: DC

## 2016-09-01 MED ORDER — DULOXETINE HCL 60 MG PO CPEP: 60.0000 mg | ORAL_CAPSULE | Freq: Every day | ORAL | 2 refills | Status: DC

## 2016-09-01 MED ORDER — AMLODIPINE BESYLATE 5 MG PO TABS: 5.0000 mg | ORAL_TABLET | Freq: Every day | ORAL | 1 refills | Status: DC

## 2016-09-01 MED ORDER — BUDESONIDE-FORMOTEROL FUMARATE 160-4.5 MCG/ACT IN AERO: 2.0000 | INHALATION_SPRAY | Freq: Two times a day (BID) | RESPIRATORY_TRACT | 1 refills | Status: DC

## 2016-09-01 MED ORDER — LISINOPRIL 20 MG PO TABS: 20.0000 mg | ORAL_TABLET | Freq: Every day | ORAL | 1 refills | Status: DC

## 2016-09-01 MED ORDER — PANTOPRAZOLE SODIUM 40 MG PO TBEC: DELAYED_RELEASE_TABLET | ORAL | 1 refills | Status: DC

## 2016-09-01 MED ORDER — CLONAZEPAM 1 MG PO TABS: 1.0000 mg | ORAL_TABLET | Freq: Two times a day (BID) | ORAL | 2 refills | Status: DC

## 2016-09-01 NOTE — Addendum Note (Signed)
Addended by: Rolena Infante on: 09/01/2016 04:04 PM   Modules accepted: Orders

## 2016-09-01 NOTE — Patient Instructions (Signed)
Secondhand Smoke What is secondhand smoke? Secondhand smoke is smoke that comes from burning tobacco. It could be the smoke from a cigarette, a pipe, or a cigar. Even if you are not the one smoking, secondhand smoke exposes you to the dangers of smoking. This is called involuntary, or passive, smoking. There are two types of secondhand smoke:  Sidestream smoke is the smoke that comes off the lighted end of a cigarette, pipe, or cigar. ? This type of smoke has the highest amount of cancer-causing agents (carcinogens). ? The particles in sidestream smoke are smaller. They get into your lungs more easily.  Mainstream smoke is the smoke that is exhaled by a person who is smoking. ? This type of smoke is also dangerous to your health.  How can secondhand smoke affect my health? Studies show that there is no safe level of secondhand smoke. This smoke contains thousands of chemicals. At least 69 of them are known to cause cancer. Secondhand smoke can also cause many other health problems. It has been linked to:  Lung cancer.  Cancer of the voice box (larynx) or throat.  Cancer of the sinuses.  Brain cancer.  Bladder cancer.  Stomach cancer.  Breast cancer.  White blood cell cancers (lymphoma and leukemia).  Brain and liver tumors in children.  Heart disease and stroke in adults.  Pregnancy loss (miscarriage).  Diseases in children, such as: ? Asthma. ? Lung infections. ? Ear infections. ? Sudden infant death syndrome (SIDS). ? Slow growth.  Where can I be at risk for exposure to secondhand smoke?  For adults, the workplace is the main source of exposure to secondhand smoke. ? Your workplace should have a policy separating smoking areas from nonsmoking areas. ? Smoking areas should have a system for ventilating and cleaning the air.  For children, the home may be the most dangerous place for exposure to secondhand smoke. ? Children who live in apartment buildings may be at  risk from smoke drifting from hallways or other people's homes.  For everyone, many public places are possible sources of exposure to secondhand smoke. ? These places include restaurants, shopping centers, and parks. How can I reduce my risk for exposure to secondhand smoke? The most important thing you can do is not smoke. Discourage family members from smoking. Other ways to reduce exposure for you and your family include the following:  Keep your home smoke free.  Make sure your child care providers do not smoke.  Warn your child about the dangers of smoking and secondhand smoke.  Do not allow smoking in your car. When someone smokes in a car, all the damaging chemicals from the smoke are confined in a small area.  Avoid public places where smoking is allowed.  This information is not intended to replace advice given to you by your health care provider. Make sure you discuss any questions you have with your health care provider. Document Released: 03/27/2004 Document Revised: 01/15/2016 Document Reviewed: 06/03/2013 Elsevier Interactive Patient Education  2017 [employer]  
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
## 2016-09-02 LAB — LIPID PANEL
CHOLESTEROL TOTAL: 144 mg/dL (ref 100–199)
Chol/HDL Ratio: 3.3 ratio (ref 0.0–4.4)
HDL: 43 mg/dL (ref >39)
LDL CALC: 65 mg/dL (ref 0–99)
Triglycerides: 182 mg/dL — ABNORMAL HIGH (ref 0–149)
VLDL CHOLESTEROL CAL: 36 mg/dL (ref 5–40)

## 2016-09-02 LAB — CMP14+EGFR
ALBUMIN: 4.3 g/dL (ref 3.6–4.8)
ALT: 9 IU/L (ref 0–32)
AST: 15 IU/L (ref 0–40)
Albumin/Globulin Ratio: 1.9 (ref 1.2–2.2)
Alkaline Phosphatase: 90 IU/L (ref 39–117)
BUN / CREAT RATIO: 19 (ref 12–28)
BUN: 15 mg/dL (ref 8–27)
Bilirubin Total: 0.3 mg/dL (ref 0.0–1.2)
CO2: 26 mmol/L (ref 20–29)
CREATININE: 0.79 mg/dL (ref 0.57–1.00)
Calcium: 9.7 mg/dL (ref 8.7–10.3)
Chloride: 103 mmol/L (ref 96–106)
GFR calc non Af Amer: 80 mL/min/{1.73_m2} (ref >59)
GFR, EST AFRICAN AMERICAN: 92 mL/min/{1.73_m2} (ref >59)
GLUCOSE: 88 mg/dL (ref 65–99)
Globulin, Total: 2.3 g/dL (ref 1.5–4.5)
Potassium: 4.4 mmol/L (ref 3.5–5.2)
Sodium: 144 mmol/L (ref 134–144)
TOTAL PROTEIN: 6.6 g/dL (ref 6.0–8.5)

## 2016-09-02 LAB — STD SCREEN (8)
HEP B S AG: NEGATIVE
HIV SCREEN 4TH GENERATION: NONREACTIVE
HSV 1 Glycoprotein G Ab, IgG: 56.2 index — ABNORMAL HIGH (ref 0.00–0.90)
Hep A IgM: NEGATIVE
Hep B C IgM: NEGATIVE
Hep C Virus Ab: <0.1 s/co ratio (ref 0.0–0.9)
RPR: NONREACTIVE

## 2016-09-02 LAB — GC/CHLAMYDIA PROBE AMP
Chlamydia trachomatis, NAA: NEGATIVE
Neisseria gonorrhoeae by PCR: NEGATIVE

## 2016-09-17 ENCOUNTER — Ambulatory Visit (INDEPENDENT_AMBULATORY_CARE_PROVIDER_SITE_OTHER): Payer: Medicaid Other | Admitting: Psychiatry

## 2016-09-17 ENCOUNTER — Encounter (HOSPITAL_COMMUNITY): Payer: Self-pay | Admitting: Psychiatry

## 2016-09-17 DIAGNOSIS — F332 Major depressive disorder, recurrent severe without psychotic features: Secondary | ICD-10-CM
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
## 2016-09-18 ENCOUNTER — Ambulatory Visit (HOSPITAL_COMMUNITY): Payer: Self-pay | Admitting: Psychiatry

## 2016-10-13 ENCOUNTER — Encounter: Payer: Self-pay | Admitting: Family Medicine

## 2016-10-13 ENCOUNTER — Ambulatory Visit (INDEPENDENT_AMBULATORY_CARE_PROVIDER_SITE_OTHER): Payer: Medicaid Other | Admitting: Family Medicine

## 2016-10-13 VITALS — BP 145/83 | HR 87 | Temp 98.0°F | Ht 60.0 in | Wt 147.8 lb

## 2016-10-13 DIAGNOSIS — N898 Other specified noninflammatory disorders of vagina: Secondary | ICD-10-CM

## 2016-10-13 DIAGNOSIS — N3000 Acute cystitis without hematuria: Secondary | ICD-10-CM | POA: Diagnosis not present

## 2016-10-13 LAB — URINALYSIS, COMPLETE
Bilirubin, UA: NEGATIVE
GLUCOSE, UA: NEGATIVE
NITRITE UA: NEGATIVE
PH UA: 5 (ref 5.0–7.5)
Specific Gravity, UA: >1.03 — ABNORMAL HIGH (ref 1.005–1.030)
UUROB: 0.2 mg/dL (ref 0.2–1.0)

## 2016-10-13 LAB — MICROSCOPIC EXAMINATION
Epithelial Cells (non renal): >10 /hpf — AB (ref 0–10)
RENAL EPITHEL UA: NONE SEEN /HPF

## 2016-10-13 MED ORDER — SULFAMETHOXAZOLE-TRIMETHOPRIM 800-160 MG PO TABS: 1.0000 | ORAL_TABLET | Freq: Two times a day (BID) | ORAL | 0 refills | Status: DC

## 2016-10-13 MED ORDER — FLUCONAZOLE 150 MG PO TABS: 150.0000 mg | ORAL_TABLET | Freq: Once | ORAL | 0 refills | Status: AC

## 2016-10-13 NOTE — Progress Notes (Signed)
BP (!) 145/83   Pulse 87   Temp 98 F (36.7 C) (Oral)   Ht 5' (1.524 m)   Wt 147 lb 12.8 oz (67 kg)   BMI 28.87 kg/m    Subjective:    Patient ID: [name], female    DOB: 1955/12/30, 61 y.o.   MRN: [id number]  HPI: [name] is a 61 y.o. female presenting on 10/13/2016 for Dysuria and Vaginal Discharge (x 1 week after sex with husband. )   HPI Dysuria and vaginal discharge Patient has been having dysuria and vaginal discharge that have been bothering her for about the past week. She says that she had Trichomonas 3 months ago and then she didn't sleep with her husband for a while and got treated and then 2 weeks ago she slept with him again and then one week ago she started having symptoms of both of these. She denies any fevers or chills or shortness of breath or wheezing. She does have some lower abdominal pressure but denies any flank pain or back pain. Her discharge has been sick and brown and started back up about a week ago as well along with irritation and swelling inside her vagina.  Relevant past medical, surgical, family and social history reviewed and updated as indicated. Interim medical history since our last visit reviewed. Allergies and medications reviewed and updated.  Review of Systems  Constitutional: Negative for chills and fever.  Eyes: Negative for redness and visual disturbance.  Respiratory: Negative for chest tightness and shortness of breath.   Cardiovascular: Negative for chest pain and leg swelling.  Gastrointestinal: Positive for abdominal pain.  Genitourinary: Positive for dysuria, frequency, vaginal discharge and vaginal pain. Negative for decreased urine volume, difficulty urinating, flank pain, hematuria, pelvic pain, urgency and vaginal bleeding.  Musculoskeletal: Negative for back pain and gait problem.  Skin: Negative for rash.  Neurological: Negative for light-headedness and headaches.  Psychiatric/Behavioral: Negative for agitation  and behavioral problems.  All other systems reviewed and are negative.   Per HPI unless specifically indicated above        Objective:    BP (!) 145/83   Pulse 87   Temp 98 F (36.7 C) (Oral)   Ht 5' (1.524 m)   Wt 147 lb 12.8 oz (67 kg)   BMI 28.87 kg/m   Wt Readings from Last 3 Encounters:  10/13/16 147 lb 12.8 oz (67 kg)  09/01/16 142 lb (64.4 kg)  08/23/16 144 lb 3.2 oz (65.4 kg)    Physical Exam  Constitutional: She is oriented to person, place, and time. She appears well-developed and well-nourished. No distress.  Eyes: Conjunctivae are normal.  Cardiovascular: Normal rate, regular rhythm, normal heart sounds and intact distal pulses.   No murmur heard. Pulmonary/Chest: Effort normal and breath sounds normal. No respiratory distress. She has no wheezes. She has no rales.  Abdominal: Soft. Bowel sounds are normal. She exhibits no distension. There is tenderness in the suprapubic area. There is no rigidity, no rebound, no guarding and no CVA tenderness.  Musculoskeletal: Normal range of motion. She exhibits no edema or tenderness.  Neurological: She is alert and oriented to person, place, and time. Coordination normal.  Skin: Skin is warm and dry. No rash noted. She is not diaphoretic.  Psychiatric: She has a normal mood and affect. Her behavior is normal.  Nursing note and vitals reviewed.   Urinalysis: 11-30 WBCs, 3-10 rbc's, greater than 10 of these cells, present mucus, few bacteria  Wet prep: Because it's after-hours wet prep will be a send out will watch for the results.    Assessment & Plan:   Problem List Items Addressed This Visit    None    Visit Diagnoses    Acute cystitis without hematuria    -  Primary   Relevant Medications   sulfamethoxazole-trimethoprim (BACTRIM DS,SEPTRA DS) 800-160 MG tablet   Other Relevant Orders   Urinalysis, Complete (Completed)   Vaginal discharge       Relevant Orders   WET PREP FOR [place], YEAST, CLUE        Follow up plan: Return if symptoms worsen or fail to improve.  Counseling provided for all of the vaccine components Orders Placed This Encounter  Procedures  . Urinalysis, Complete    Caryl Pina, MD [facility] 10/13/2016, 5:28 PM

## 2016-10-14 ENCOUNTER — Telehealth: Payer: Self-pay | Admitting: Family Medicine

## 2016-10-14 DIAGNOSIS — N3000 Acute cystitis without hematuria: Secondary | ICD-10-CM

## 2016-10-14 LAB — WET PREP FOR TRICH, YEAST, CLUE
CLUE CELL EXAM: NEGATIVE
Trichomonas Exam: POSITIVE — AB
Yeast Exam: NEGATIVE

## 2016-10-14 MED ORDER — SULFAMETHOXAZOLE-TRIMETHOPRIM 800-160 MG PO TABS: 1.0000 | ORAL_TABLET | Freq: Two times a day (BID) | ORAL | 0 refills | Status: DC

## 2016-10-14 NOTE — Telephone Encounter (Signed)
Patient aware that prescription was cancelled at [facility] and send to [facility] instead

## 2016-10-16 ENCOUNTER — Other Ambulatory Visit: Payer: Self-pay | Admitting: *Deleted

## 2016-10-16 MED ORDER — METRONIDAZOLE 500 MG PO TABS: 500.0000 mg | ORAL_TABLET | Freq: Two times a day (BID) | ORAL | 0 refills | Status: DC

## 2016-10-20 ENCOUNTER — Other Ambulatory Visit: Payer: Self-pay | Admitting: Family Medicine

## 2016-10-23 ENCOUNTER — Ambulatory Visit (INDEPENDENT_AMBULATORY_CARE_PROVIDER_SITE_OTHER): Payer: Medicaid Other | Admitting: Psychiatry

## 2016-10-23 ENCOUNTER — Encounter (HOSPITAL_COMMUNITY): Payer: Self-pay | Admitting: Psychiatry

## 2016-10-23 DIAGNOSIS — F332 Major depressive disorder, recurrent severe without psychotic features: Secondary | ICD-10-CM | POA: Diagnosis not present
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
## 2016-11-12 ENCOUNTER — Ambulatory Visit (INDEPENDENT_AMBULATORY_CARE_PROVIDER_SITE_OTHER): Payer: Medicaid Other | Admitting: Psychiatry

## 2016-11-12 ENCOUNTER — Other Ambulatory Visit (HOSPITAL_COMMUNITY): Payer: Self-pay | Admitting: Psychiatry

## 2016-11-12 ENCOUNTER — Encounter (HOSPITAL_COMMUNITY): Payer: Self-pay | Admitting: Psychiatry

## 2016-11-12 VITALS — BP 114/89 | HR 93 | Ht 60.0 in | Wt 150.8 lb

## 2016-11-12 DIAGNOSIS — F411 Generalized anxiety disorder: Secondary | ICD-10-CM

## 2016-11-12 DIAGNOSIS — F332 Major depressive disorder, recurrent severe without psychotic features: Secondary | ICD-10-CM

## 2016-11-12 MED ORDER — CLONAZEPAM 1 MG PO TABS: 1.0000 mg | ORAL_TABLET | Freq: Two times a day (BID) | ORAL | 2 refills | Status: DC

## 2016-11-12 MED ORDER — DULOXETINE HCL 60 MG PO CPEP: 60.0000 mg | ORAL_CAPSULE | Freq: Every day | ORAL | 2 refills | Status: DC
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
## 2016-11-20 ENCOUNTER — Encounter (HOSPITAL_COMMUNITY): Payer: Self-pay | Admitting: Psychiatry

## 2016-11-20 ENCOUNTER — Ambulatory Visit (INDEPENDENT_AMBULATORY_CARE_PROVIDER_SITE_OTHER): Payer: Medicaid Other | Admitting: Psychiatry

## 2016-11-20 DIAGNOSIS — F411 Generalized anxiety disorder: Secondary | ICD-10-CM
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
## 2016-12-19 ENCOUNTER — Other Ambulatory Visit: Payer: Self-pay | Admitting: Family Medicine

## 2016-12-26 ENCOUNTER — Ambulatory Visit (INDEPENDENT_AMBULATORY_CARE_PROVIDER_SITE_OTHER): Payer: Medicaid Other | Admitting: *Deleted

## 2016-12-26 DIAGNOSIS — Z23 Encounter for immunization: Secondary | ICD-10-CM | POA: Diagnosis not present

## 2017-01-09 ENCOUNTER — Other Ambulatory Visit (HOSPITAL_COMMUNITY): Payer: Self-pay | Admitting: Psychiatry

## 2017-01-09 DIAGNOSIS — F332 Major depressive disorder, recurrent severe without psychotic features: Secondary | ICD-10-CM

## 2017-01-21 ENCOUNTER — Other Ambulatory Visit: Payer: Self-pay | Admitting: Family Medicine

## 2017-01-29 ENCOUNTER — Telehealth: Payer: Self-pay

## 2017-01-29 NOTE — Telephone Encounter (Signed)
Went to ER yesterday  Found something in my esophagus  Need ENT appt.
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
## 2017-01-29 NOTE — Telephone Encounter (Signed)
If it was in the esophagus she needs GI referral. Please clarify & refer to the appropriate specialist. Thanks, WS

## 2017-02-02 ENCOUNTER — Ambulatory Visit (INDEPENDENT_AMBULATORY_CARE_PROVIDER_SITE_OTHER): Payer: Medicaid Other | Admitting: Otolaryngology

## 2017-02-02 DIAGNOSIS — R070 Pain in throat: Secondary | ICD-10-CM

## 2017-02-02 DIAGNOSIS — F17210 Nicotine dependence, cigarettes, uncomplicated: Secondary | ICD-10-CM | POA: Diagnosis not present

## 2017-02-10 ENCOUNTER — Other Ambulatory Visit (HOSPITAL_COMMUNITY): Payer: Self-pay | Admitting: Psychiatry

## 2017-02-10 DIAGNOSIS — F332 Major depressive disorder, recurrent severe without psychotic features: Secondary | ICD-10-CM

## 2017-02-11 ENCOUNTER — Encounter (HOSPITAL_COMMUNITY): Payer: Self-pay | Admitting: Psychiatry

## 2017-02-11 ENCOUNTER — Ambulatory Visit (INDEPENDENT_AMBULATORY_CARE_PROVIDER_SITE_OTHER): Payer: Medicaid Other | Admitting: Psychiatry

## 2017-02-11 DIAGNOSIS — Z818 Family history of other mental and behavioral disorders: Secondary | ICD-10-CM

## 2017-02-11 DIAGNOSIS — F411 Generalized anxiety disorder: Secondary | ICD-10-CM | POA: Diagnosis not present

## 2017-02-11 DIAGNOSIS — Z736 Limitation of activities due to disability: Secondary | ICD-10-CM | POA: Diagnosis not present

## 2017-02-11 DIAGNOSIS — F332 Major depressive disorder, recurrent severe without psychotic features: Secondary | ICD-10-CM | POA: Diagnosis not present

## 2017-02-11 DIAGNOSIS — M542 Cervicalgia: Secondary | ICD-10-CM

## 2017-02-11 DIAGNOSIS — Z813 Family history of other psychoactive substance abuse and dependence: Secondary | ICD-10-CM

## 2017-02-11 DIAGNOSIS — M549 Dorsalgia, unspecified: Secondary | ICD-10-CM

## 2017-02-11 DIAGNOSIS — F17210 Nicotine dependence, cigarettes, uncomplicated: Secondary | ICD-10-CM

## 2017-02-11 MED ORDER — DULOXETINE HCL 60 MG PO CPEP
60.0000 mg | ORAL_CAPSULE | Freq: Every day | ORAL | 2 refills | Status: DC
Start: 2017-02-11 — End: 2017-04-13

## 2017-02-11 MED ORDER — CLONAZEPAM 1 MG PO TABS: 1.0000 mg | ORAL_TABLET | Freq: Two times a day (BID) | ORAL | 2 refills | Status: DC
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
## 2017-02-18 ENCOUNTER — Emergency Department (HOSPITAL_COMMUNITY)
Admission: EM | Admit: 2017-02-18 | Discharge: 2017-02-19 | Disposition: A | Discharge status: Home / Self Care | Payer: Medicaid Other | Attending: Emergency Medicine | Admitting: Emergency Medicine

## 2017-02-18 ENCOUNTER — Encounter (HOSPITAL_COMMUNITY): Payer: Self-pay | Admitting: *Deleted

## 2017-02-18 DIAGNOSIS — F17210 Nicotine dependence, cigarettes, uncomplicated: Secondary | ICD-10-CM | POA: Insufficient documentation

## 2017-02-18 DIAGNOSIS — Z859 Personal history of malignant neoplasm, unspecified: Secondary | ICD-10-CM | POA: Diagnosis not present

## 2017-02-18 DIAGNOSIS — J449 Chronic obstructive pulmonary disease, unspecified: Secondary | ICD-10-CM | POA: Diagnosis not present

## 2017-02-18 DIAGNOSIS — R350 Frequency of micturition: Secondary | ICD-10-CM | POA: Diagnosis not present

## 2017-02-18 DIAGNOSIS — Z7982 Long term (current) use of aspirin: Secondary | ICD-10-CM | POA: Insufficient documentation

## 2017-02-18 DIAGNOSIS — Z79899 Other long term (current) drug therapy: Secondary | ICD-10-CM | POA: Diagnosis not present

## 2017-02-18 DIAGNOSIS — I2510 Atherosclerotic heart disease of native coronary artery without angina pectoris: Secondary | ICD-10-CM | POA: Diagnosis not present

## 2017-02-18 DIAGNOSIS — G8929 Other chronic pain: Secondary | ICD-10-CM | POA: Diagnosis not present

## 2017-02-18 DIAGNOSIS — I10 Essential (primary) hypertension: Secondary | ICD-10-CM | POA: Diagnosis not present

## 2017-02-18 DIAGNOSIS — M5440 Lumbago with sciatica, unspecified side: Secondary | ICD-10-CM | POA: Insufficient documentation

## 2017-02-18 DIAGNOSIS — Z113 Encounter for screening for infections with a predominantly sexual mode of transmission: Secondary | ICD-10-CM | POA: Insufficient documentation

## 2017-02-18 DIAGNOSIS — M545 Low back pain: Secondary | ICD-10-CM | POA: Diagnosis present

## 2017-02-18 NOTE — ED Triage Notes (Signed)
Patient says she started having pain in the left lower back that goes down her left leg for about a month. Started hurting worse when she went to pick up something on Monday. Last ibuprofen and percocet two hours ago.

## 2017-02-19 LAB — URINALYSIS, ROUTINE W REFLEX MICROSCOPIC
BILIRUBIN URINE: NEGATIVE
GLUCOSE, UA: NEGATIVE mg/dL
HGB URINE DIPSTICK: NEGATIVE
Ketones, ur: 5 mg/dL — AB
Leukocytes, UA: NEGATIVE
Nitrite: NEGATIVE
PROTEIN: NEGATIVE mg/dL
Specific Gravity, Urine: 1.025 (ref 1.005–1.030)
pH: 5 (ref 5.0–8.0)

## 2017-02-19 MED ORDER — DEXAMETHASONE SODIUM PHOSPHATE 10 MG/ML IJ SOLN
10.0000 mg | Freq: Once | INTRAMUSCULAR | Status: AC
  Administered 2017-02-19: 10 mg via INTRAMUSCULAR
  Filled 2017-02-19: qty 1

## 2017-02-19 MED ORDER — CYCLOBENZAPRINE HCL 10 MG PO TABS
10.0000 mg | ORAL_TABLET | Freq: Once | ORAL | Status: AC
  Administered 2017-02-19: 10 mg via ORAL
  Filled 2017-02-19: qty 1

## 2017-02-19 MED ORDER — CYCLOBENZAPRINE HCL 10 MG PO TABS: 10.0000 mg | ORAL_TABLET | Freq: Three times a day (TID) | ORAL | 0 refills | Status: DC

## 2017-02-19 MED ORDER — ONDANSETRON HCL 4 MG PO TABS
4.0000 mg | ORAL_TABLET | Freq: Once | ORAL | Status: AC
  Administered 2017-02-19: 4 mg via ORAL
  Filled 2017-02-19: qty 1

## 2017-02-19 MED ORDER — MORPHINE SULFATE (PF) 4 MG/ML IV SOLN
4.0000 mg | Freq: Once | INTRAVENOUS | Status: AC
  Administered 2017-02-19: 4 mg via INTRAMUSCULAR
  Filled 2017-02-19: qty 1

## 2017-02-19 NOTE — Discharge Instructions (Signed)
Your blood pressure is slightly elevated at 162/74.  Please have this rechecked by your primary physician.  Your examination favors left lower back pain with sciatica.  Please continue to use a heating pad.  Please continue to use your medications currently prescribed.  Please add Flexeril to help with the spasm aspect of this pain.  Flexeril may cause drowsiness.  Please do not drive, drink, operate machinery, or participate in activities requiring concentration when taking this medication.  Please make your primary physician and your pain management physician aware that you are taking this medication.
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
## 2017-02-20 ENCOUNTER — Other Ambulatory Visit: Payer: Self-pay | Admitting: Nurse Practitioner

## 2017-02-20 DIAGNOSIS — K219 Gastro-esophageal reflux disease without esophagitis: Secondary | ICD-10-CM

## 2017-02-20 DIAGNOSIS — I10 Essential (primary) hypertension: Secondary | ICD-10-CM

## 2017-02-20 DIAGNOSIS — E782 Mixed hyperlipidemia: Secondary | ICD-10-CM

## 2017-02-20 DIAGNOSIS — J441 Chronic obstructive pulmonary disease with (acute) exacerbation: Secondary | ICD-10-CM

## 2017-02-20 LAB — GC/CHLAMYDIA PROBE AMP (~~LOC~~) NOT AT ARMC
Chlamydia: NEGATIVE
Neisseria Gonorrhea: NEGATIVE

## 2017-02-25 NOTE — Telephone Encounter (Signed)
Last seen 10/13/16  Dr Stacks 

## 2017-03-05 ENCOUNTER — Ambulatory Visit: Payer: Medicaid Other | Admitting: Family Medicine

## 2017-03-21 ENCOUNTER — Other Ambulatory Visit: Payer: Self-pay | Admitting: Family Medicine

## 2017-03-31 ENCOUNTER — Telehealth: Payer: Self-pay | Admitting: Family Medicine

## 2017-03-31 NOTE — Telephone Encounter (Signed)
Order on providers desk waiting for signature

## 2017-04-08 ENCOUNTER — Ambulatory Visit: Payer: Medicaid Other | Admitting: Family Medicine

## 2017-04-08 ENCOUNTER — Encounter: Payer: Self-pay | Admitting: Family Medicine

## 2017-04-08 VITALS — BP 140/83 | HR 80 | Temp 99.2°F | Ht 60.0 in | Wt 141.0 lb

## 2017-04-08 DIAGNOSIS — R300 Dysuria: Secondary | ICD-10-CM | POA: Diagnosis not present

## 2017-04-08 DIAGNOSIS — N760 Acute vaginitis: Secondary | ICD-10-CM

## 2017-04-08 DIAGNOSIS — B9689 Other specified bacterial agents as the cause of diseases classified elsewhere: Secondary | ICD-10-CM | POA: Diagnosis not present

## 2017-04-08 LAB — URINALYSIS, COMPLETE
BILIRUBIN UA: NEGATIVE
GLUCOSE, UA: NEGATIVE
LEUKOCYTES UA: NEGATIVE
NITRITE UA: NEGATIVE
RBC UA: NEGATIVE
Urobilinogen, Ur: 2 mg/dL — ABNORMAL HIGH (ref 0.2–1.0)
pH, UA: 5.5 (ref 5.0–7.5)

## 2017-04-08 LAB — MICROSCOPIC EXAMINATION
Epithelial Cells (non renal): >10 /hpf — AB (ref 0–10)
RBC, UA: NONE SEEN /hpf (ref 0–?)
Renal Epithel, UA: NONE SEEN /hpf

## 2017-04-08 LAB — WET PREP FOR TRICH, YEAST, CLUE
CLUE CELL EXAM: POSITIVE — AB
Trichomonas Exam: NEGATIVE
YEAST EXAM: NEGATIVE

## 2017-04-08 MED ORDER — METRONIDAZOLE 500 MG PO TABS: 500.0000 mg | ORAL_TABLET | Freq: Two times a day (BID) | ORAL | 0 refills | Status: DC
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
## 2017-04-11 LAB — CHLAMYDIA/GONOCOCCUS/TRICHOMONAS, NAA
CHLAMYDIA BY NAA: NEGATIVE
Gonococcus by NAA: NEGATIVE
Trich vag by NAA: NEGATIVE

## 2017-04-13 ENCOUNTER — Other Ambulatory Visit (HOSPITAL_COMMUNITY): Payer: Self-pay | Admitting: Psychiatry

## 2017-04-13 DIAGNOSIS — F332 Major depressive disorder, recurrent severe without psychotic features: Secondary | ICD-10-CM

## 2017-05-11 ENCOUNTER — Ambulatory Visit (INDEPENDENT_AMBULATORY_CARE_PROVIDER_SITE_OTHER): Payer: Medicaid Other | Admitting: Psychiatry

## 2017-05-11 ENCOUNTER — Encounter (HOSPITAL_COMMUNITY): Payer: Self-pay | Admitting: Psychiatry

## 2017-05-11 DIAGNOSIS — Z813 Family history of other psychoactive substance abuse and dependence: Secondary | ICD-10-CM

## 2017-05-11 DIAGNOSIS — Z818 Family history of other mental and behavioral disorders: Secondary | ICD-10-CM

## 2017-05-11 DIAGNOSIS — Z811 Family history of alcohol abuse and dependence: Secondary | ICD-10-CM | POA: Diagnosis not present

## 2017-05-11 DIAGNOSIS — F332 Major depressive disorder, recurrent severe without psychotic features: Secondary | ICD-10-CM | POA: Diagnosis not present

## 2017-05-11 DIAGNOSIS — F17210 Nicotine dependence, cigarettes, uncomplicated: Secondary | ICD-10-CM | POA: Diagnosis not present

## 2017-05-11 DIAGNOSIS — F411 Generalized anxiety disorder: Secondary | ICD-10-CM | POA: Diagnosis not present

## 2017-05-11 DIAGNOSIS — M549 Dorsalgia, unspecified: Secondary | ICD-10-CM | POA: Diagnosis not present

## 2017-05-11 DIAGNOSIS — Z736 Limitation of activities due to disability: Secondary | ICD-10-CM | POA: Diagnosis not present

## 2017-05-11 MED ORDER — CLONAZEPAM 1 MG PO TABS: 1.0000 mg | ORAL_TABLET | Freq: Two times a day (BID) | ORAL | 2 refills | Status: DC

## 2017-05-11 MED ORDER — DULOXETINE HCL 60 MG PO CPEP: 60.0000 mg | ORAL_CAPSULE | Freq: Every day | ORAL | 2 refills | Status: DC
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
## 2017-05-12 ENCOUNTER — Other Ambulatory Visit: Payer: Self-pay | Admitting: Dermatology

## 2017-05-14 ENCOUNTER — Telehealth: Payer: Self-pay | Admitting: Family Medicine

## 2017-05-14 NOTE — Telephone Encounter (Signed)
Informed patient she would need to be seen to discuss with Dr. Livia Snellen.  Appt made

## 2017-05-20 ENCOUNTER — Other Ambulatory Visit: Payer: Self-pay | Admitting: Family Medicine

## 2017-05-25 ENCOUNTER — Ambulatory Visit: Payer: Medicaid Other | Admitting: Family Medicine

## 2017-05-25 ENCOUNTER — Encounter: Payer: Self-pay | Admitting: Family Medicine

## 2017-05-25 VITALS — BP 133/67 | HR 83 | Temp 97.9°F | Ht 60.0 in | Wt 139.5 lb

## 2017-05-25 DIAGNOSIS — I10 Essential (primary) hypertension: Secondary | ICD-10-CM | POA: Diagnosis not present

## 2017-05-25 DIAGNOSIS — J439 Emphysema, unspecified: Secondary | ICD-10-CM

## 2017-05-25 DIAGNOSIS — E782 Mixed hyperlipidemia: Secondary | ICD-10-CM

## 2017-05-25 DIAGNOSIS — R5383 Other fatigue: Secondary | ICD-10-CM | POA: Diagnosis not present

## 2017-05-25 DIAGNOSIS — M79674 Pain in right toe(s): Secondary | ICD-10-CM

## 2017-05-25 DIAGNOSIS — F1120 Opioid dependence, uncomplicated: Secondary | ICD-10-CM

## 2017-05-25 DIAGNOSIS — K219 Gastro-esophageal reflux disease without esophagitis: Secondary | ICD-10-CM | POA: Diagnosis not present

## 2017-05-25 MED ORDER — PANTOPRAZOLE SODIUM 40 MG PO TBEC: 40.0000 mg | DELAYED_RELEASE_TABLET | Freq: Every day | ORAL | 1 refills | Status: DC

## 2017-05-25 MED ORDER — AMLODIPINE BESYLATE 5 MG PO TABS: 5.0000 mg | ORAL_TABLET | Freq: Every day | ORAL | 1 refills | Status: DC

## 2017-05-25 MED ORDER — LISINOPRIL 20 MG PO TABS: 20.0000 mg | ORAL_TABLET | Freq: Every day | ORAL | 1 refills | Status: DC

## 2017-05-25 MED ORDER — ATORVASTATIN CALCIUM 40 MG PO TABS: 40.0000 mg | ORAL_TABLET | Freq: Every day | ORAL | 1 refills | Status: DC

## 2017-05-25 MED ORDER — BUDESONIDE-FORMOTEROL FUMARATE 160-4.5 MCG/ACT IN AERO: INHALATION_SPRAY | RESPIRATORY_TRACT | 5 refills | Status: DC

## 2017-05-25 NOTE — Progress Notes (Signed)
Subjective:  Patient ID: [name], female    DOB: 10-05-1955  Age: 62 y.o. MRN: [id number]  CC: Medication Management (pt here today needing a referral to another pain management office since her current pain management sent her a letter saying they couldn't comply with the Medicaid narcotic prescriptions rule where only 1 provider prescribes pain medication)   HPI LORRY FURBER presents for pain in the left hip and left anterior thigh.  Also pain in the right shoulder.  She has been seeing a local pain clinic that has been managing this until recently.  Unfortunately they no longer going to accept her Medicaid due to changes in [place] regulations.  The patient states they explained that they worked together as a team and the new regulations require them to have the same provider see the patient on every occasion.  This is not compatible with their practice model.  She is requesting a referral  to Dr. Trula Ore in [place].  Patient in for follow-up of elevated cholesterol. Doing well without complaints on current medication. Denies side effects of statin including myalgia and arthralgia and nausea. Also in today for liver function testing. Currently no chest pain, shortness of breath or other cardiovascular related symptoms noted.   Follow-up of hypertension. Patient has no history of headache chest pain or shortness of breath or recent cough. Patient also denies symptoms of TIA such as numbness weakness lateralizing. Patient checks  blood pressure at home and has not had any elevated readings recently. Patient denies side effects from his medication. States taking it regularly.  Patient in for follow-up of GERD. Currently asymptomatic taking  PPI daily. There is no chest pain or heartburn. No hematemesis and no melena. No dysphagia or choking. Onset is remote. Progression is stable. Complicating factors, none.  Patient continues to use Symbicort regularly with no excessive shortness of  breath noted today. Depression screen [facility] 2/9 04/08/2017 10/13/2016 09/01/2016  Decreased Interest 3 0 2  Down, Depressed, Hopeless 3 0 1  PHQ - 2 Score 6 0 3  Altered sleeping 2 - 2  Tired, decreased energy 3 - 2  Change in appetite 3 - 2  Feeling bad or failure about yourself  2 - 1  Trouble concentrating 2 - 2  Moving slowly or fidgety/restless 1 - 0  Suicidal thoughts 0 - 0  PHQ-9 Score 19 - 12  Difficult doing work/chores - - -  Some encounter information is confidential and restricted. Go to Review Flowsheets activity to see all data.  Some recent data might be hidden    History Clair has a past medical history of Arthritis, Cancer ([place]), COPD (chronic obstructive pulmonary disease) ([place]), Coronary artery disease, Depression, adenomatous polyp of colon (10/03/2014), Hypercholesteremia, Hypertension, Panic attacks, Perimenopausal, and Tobacco user.   She has a past surgical history that includes Abdominal hysterectomy; vocal cord; Right foot toe surgery; Colonoscopy; and Upper gastrointestinal endoscopy.   Her family history includes Alcohol abuse in her brother; Anxiety disorder in her brother and sister; Colon cancer (age of onset: 31) in her brother; Depression in her brother and sister; Drug abuse in her son; Heart attack in her brother, father, and mother; Heart disease in her mother.She reports that she has been smoking cigarettes.  She started smoking about 48 years ago. She has a 40.00 pack-year smoking history. She has never used smokeless tobacco. She reports that she does not drink alcohol or use drugs.    ROS Review of Systems  Constitutional:  Negative for appetite change and fever.  HENT: Negative for congestion, rhinorrhea and sore throat.   Eyes: Negative for visual disturbance.  Respiratory: Positive for shortness of breath. Negative for cough.   Cardiovascular: Negative for chest pain and palpitations.  Gastrointestinal: Negative for abdominal pain, diarrhea and  nausea.  Genitourinary: Negative for dysuria.  Musculoskeletal: Positive for arthralgias, back pain and myalgias.    Objective:  BP 133/67   Pulse 83   Temp 97.9 F (36.6 C) (Oral)   Ht 5' (1.524 m)   Wt 139 lb 8 oz (63.3 kg)   BMI 27.24 kg/m   BP Readings from Last 3 Encounters:  05/25/17 133/67  04/08/17 140/83  02/18/17 (!) 162/74    Wt Readings from Last 3 Encounters:  05/25/17 139 lb 8 oz (63.3 kg)  04/08/17 141 lb (64 kg)  02/18/17 142 lb (64.4 kg)     Physical Exam  Constitutional: She is oriented to person, place, and time. She appears well-developed and well-nourished. No distress.  HENT:  Head: Normocephalic and atraumatic.  Right Ear: External ear normal.  Left Ear: External ear normal.  Nose: Nose normal.  Mouth/Throat: Oropharynx is clear and moist.  Eyes: Pupils are equal, round, and reactive to light. Conjunctivae and EOM are normal.  Neck: Normal range of motion. Neck supple. No thyromegaly present.  Cardiovascular: Normal rate, regular rhythm and normal heart sounds.  No murmur heard. Pulmonary/Chest: Effort normal and breath sounds normal. No respiratory distress. She has no wheezes. She has no rales.  Abdominal: Soft. Bowel sounds are normal. She exhibits no distension. There is no tenderness.  Musculoskeletal: Normal range of motion. She exhibits deformity (There is hammertoe deformity noted at the second toe bilaterally.  Multiple toes have a claw deformity of the nail.).  Lymphadenopathy:    She has no cervical adenopathy.  Neurological: She is alert and oriented to person, place, and time. She has normal reflexes.  Skin: Skin is warm and dry.  Psychiatric: She has a normal mood and affect. Her behavior is normal. Judgment and thought content normal.      Assessment & Plan:   Kimberlynn was seen today for medication management.  Diagnoses and all orders for this visit:  Essential hypertension -     CBC with Differential/Platelet -      CMP14+EGFR -     amLODipine (NORVASC) 5 MG tablet; Take 1 tablet (5 mg total) by mouth daily. -     lisinopril (PRINIVIL,ZESTRIL) 20 MG tablet; Take 1 tablet (20 mg total) by mouth daily.  Gastroesophageal reflux disease without esophagitis -     pantoprazole (PROTONIX) 40 MG tablet; Take 1 tablet (40 mg total) by mouth daily.  Mixed hyperlipidemia -     Lipid panel -     atorvastatin (LIPITOR) 40 MG tablet; Take 1 tablet (40 mg total) by mouth at bedtime.  Uncomplicated opioid dependence ([place]) -     Ambulatory referral to Pain Clinic  Toe pain, right -     Ambulatory referral to Podiatry  Other fatigue -     TSH  Pulmonary emphysema, unspecified emphysema type (HCC) -     budesonide-formoterol (SYMBICORT) 160-4.5 MCG/ACT inhaler; INHALE TWO PUFFS INTO THE LUNGS TWICE DAILY       I have discontinued Rainna M. Durell's EMBEDA and metroNIDAZOLE. I have changed her SYMBICORT to budesonide-formoterol. I have also changed her amLODipine, lisinopril, pantoprazole, and atorvastatin. Additionally, I am having her maintain her aspirin EC, fish oil-omega-3 fatty acids,  vitamin C, multivitamin, oxyCODONE-acetaminophen, cyclobenzaprine, meloxicam, DULoxetine, clonazePAM, and ALLERGY RELIEF.  Allergies as of 05/25/2017      Reactions   Lorcet 10-650 [hydrocodone-acetaminophen] Itching      Medication List        Accurate as of 05/25/17  7:04 PM. Always use your most recent med list.          ALLERGY RELIEF 10 MG tablet Generic drug:  loratadine TAKE ONE TABLET BY MOUTH EVERY DAY   amLODipine 5 MG tablet Commonly known as:  NORVASC Take 1 tablet (5 mg total) by mouth daily.   aspirin EC 81 MG tablet Take 81 mg by mouth daily.   atorvastatin 40 MG tablet Commonly known as:  LIPITOR Take 1 tablet (40 mg total) by mouth at bedtime.   budesonide-formoterol 160-4.5 MCG/ACT inhaler Commonly known as:  SYMBICORT INHALE TWO PUFFS INTO THE LUNGS TWICE DAILY   clonazePAM 1 MG  tablet Commonly known as:  KLONOPIN Take 1 tablet (1 mg total) by mouth 2 (two) times daily.   cyclobenzaprine 10 MG tablet Commonly known as:  FLEXERIL Take 1 tablet (10 mg total) by mouth 3 (three) times daily.   DULoxetine 60 MG capsule Commonly known as:  CYMBALTA Take 1 capsule (60 mg total) by mouth daily.   fish oil-omega-3 fatty acids 1000 MG capsule Take 1 g by mouth daily.   lisinopril 20 MG tablet Commonly known as:  PRINIVIL,ZESTRIL Take 1 tablet (20 mg total) by mouth daily.   meloxicam 7.5 MG tablet Commonly known as:  MOBIC Take 7.5 mg by mouth 2 (two) times daily.   multivitamin tablet Take 1 tablet by mouth daily.   oxyCODONE-acetaminophen 10-325 MG tablet Commonly known as:  PERCOCET Take 1 tablet by mouth every 6 (six) hours as needed for pain.   pantoprazole 40 MG tablet Commonly known as:  PROTONIX Take 1 tablet (40 mg total) by mouth daily.   vitamin C 1000 MG tablet Take 1,000 mg by mouth daily.        Follow-up: Return in about 6 months (around 11/25/2017) for hypertension, cholesterol.  Claretta Fraise, M.D.

## 2017-05-26 LAB — CBC WITH DIFFERENTIAL/PLATELET
BASOS ABS: 0 10*3/uL (ref 0.0–0.2)
Basos: 0 %
EOS (ABSOLUTE): 0.1 10*3/uL (ref 0.0–0.4)
Eos: 1 %
Hematocrit: 44 % (ref 34.0–46.6)
Hemoglobin: 14.4 g/dL (ref 11.1–15.9)
Immature Grans (Abs): 0 10*3/uL (ref 0.0–0.1)
Immature Granulocytes: 0 %
LYMPHS ABS: 2.4 10*3/uL (ref 0.7–3.1)
Lymphs: 23 %
MCH: 31.4 pg (ref 26.6–33.0)
MCHC: 32.7 g/dL (ref 31.5–35.7)
MCV: 96 fL (ref 79–97)
MONOS ABS: 0.7 10*3/uL (ref 0.1–0.9)
Monocytes: 7 %
NEUTROS ABS: 7.2 10*3/uL — AB (ref 1.4–7.0)
Neutrophils: 69 %
Platelets: 362 10*3/uL (ref 150–379)
RBC: 4.58 x10E6/uL (ref 3.77–5.28)
RDW: 14.7 % (ref 12.3–15.4)
WBC: 10.3 10*3/uL (ref 3.4–10.8)

## 2017-05-26 LAB — LIPID PANEL
CHOLESTEROL TOTAL: 165 mg/dL (ref 100–199)
Chol/HDL Ratio: 3.2 ratio (ref 0.0–4.4)
HDL: 51 mg/dL (ref >39)
LDL CALC: 96 mg/dL (ref 0–99)
TRIGLYCERIDES: 90 mg/dL (ref 0–149)
VLDL CHOLESTEROL CAL: 18 mg/dL (ref 5–40)

## 2017-05-26 LAB — CMP14+EGFR
ALBUMIN: 4.3 g/dL (ref 3.6–4.8)
ALK PHOS: 98 IU/L (ref 39–117)
ALT: 14 IU/L (ref 0–32)
AST: 14 IU/L (ref 0–40)
Albumin/Globulin Ratio: 2 (ref 1.2–2.2)
BUN / CREAT RATIO: 22 (ref 12–28)
BUN: 16 mg/dL (ref 8–27)
CHLORIDE: 105 mmol/L (ref 96–106)
CO2: 25 mmol/L (ref 20–29)
CREATININE: 0.73 mg/dL (ref 0.57–1.00)
Calcium: 9.9 mg/dL (ref 8.7–10.3)
GFR calc Af Amer: 101 mL/min/{1.73_m2} (ref >59)
GFR calc non Af Amer: 87 mL/min/{1.73_m2} (ref >59)
GLUCOSE: 71 mg/dL (ref 65–99)
Globulin, Total: 2.1 g/dL (ref 1.5–4.5)
Potassium: 4.8 mmol/L (ref 3.5–5.2)
SODIUM: 146 mmol/L — AB (ref 134–144)
Total Protein: 6.4 g/dL (ref 6.0–8.5)

## 2017-05-26 LAB — TSH: TSH: 1.82 u[IU]/mL (ref 0.450–4.500)

## 2017-06-11 ENCOUNTER — Ambulatory Visit: Payer: Medicaid Other | Admitting: Podiatry

## 2017-06-11 ENCOUNTER — Ambulatory Visit (INDEPENDENT_AMBULATORY_CARE_PROVIDER_SITE_OTHER): Payer: Medicaid Other

## 2017-06-11 ENCOUNTER — Encounter: Payer: Self-pay | Admitting: Podiatry

## 2017-06-11 VITALS — BP 137/86 | HR 81

## 2017-06-11 DIAGNOSIS — M779 Enthesopathy, unspecified: Secondary | ICD-10-CM

## 2017-06-11 DIAGNOSIS — D492 Neoplasm of unspecified behavior of bone, soft tissue, and skin: Secondary | ICD-10-CM

## 2017-06-11 DIAGNOSIS — M2041 Other hammer toe(s) (acquired), right foot: Secondary | ICD-10-CM | POA: Diagnosis not present

## 2017-06-11 DIAGNOSIS — M7751 Other enthesopathy of right foot: Secondary | ICD-10-CM | POA: Diagnosis not present

## 2017-06-11 DIAGNOSIS — M7752 Other enthesopathy of left foot: Secondary | ICD-10-CM | POA: Diagnosis not present

## 2017-06-11 DIAGNOSIS — B079 Viral wart, unspecified: Secondary | ICD-10-CM

## 2017-06-11 NOTE — Progress Notes (Signed)
Subjective:   Patient ID: [name], female   DOB: 63 y.o.   MRN: [id number]   HPI Patient presents concerned about possible digital deformities of the lesser digits right over left foot with lesions plantar aspect both feet that are painful   Review of Systems  All other systems reviewed and are negative.       Objective:  Physical Exam  Constitutional: She appears well-developed and well-nourished.  Cardiovascular: Intact distal pulses.  Pulmonary/Chest: Effort normal.  Musculoskeletal: Normal range of motion.  Neurological: She is alert.  Skin: Skin is warm.  Nursing note and vitals reviewed.   Neurovascular status intact muscle strength adequate range of motion within normal limits with patient found to have previous surgery on the metatarsals right with keratotic lesion sub-fifth metatarsal bilateral digital deformities and upon debridement pinpoint bleeding on the left with pain to lateral pressure     Assessment:  Structural changes as part of problem along with probable verruca plantaris left     Plan:  H&P conditions reviewed and debrided the lesion on the left with no iatrogenic bleeding noted.  I then went ahead and applied a chemical agent to create immune response and applied sterile dressing and instructed what to do if any blistering were to occur.  Do not recommend further surgery  X-rays indicate fixation in place third fourth metatarsals right with digital deformity noted with previous surgery already having been attempted on the toes

## 2017-06-13 ENCOUNTER — Other Ambulatory Visit (HOSPITAL_COMMUNITY): Payer: Self-pay | Admitting: Psychiatry

## 2017-06-13 DIAGNOSIS — F332 Major depressive disorder, recurrent severe without psychotic features: Secondary | ICD-10-CM

## 2017-08-11 ENCOUNTER — Ambulatory Visit (HOSPITAL_COMMUNITY): Payer: Self-pay | Admitting: Psychiatry

## 2017-08-12 ENCOUNTER — Encounter (HOSPITAL_COMMUNITY): Payer: Self-pay | Admitting: Psychiatry

## 2017-08-12 ENCOUNTER — Ambulatory Visit (INDEPENDENT_AMBULATORY_CARE_PROVIDER_SITE_OTHER): Payer: Medicaid Other | Admitting: Psychiatry

## 2017-08-12 DIAGNOSIS — F17210 Nicotine dependence, cigarettes, uncomplicated: Secondary | ICD-10-CM | POA: Diagnosis not present

## 2017-08-12 DIAGNOSIS — Z811 Family history of alcohol abuse and dependence: Secondary | ICD-10-CM

## 2017-08-12 DIAGNOSIS — M549 Dorsalgia, unspecified: Secondary | ICD-10-CM | POA: Diagnosis not present

## 2017-08-12 DIAGNOSIS — M2550 Pain in unspecified joint: Secondary | ICD-10-CM

## 2017-08-12 DIAGNOSIS — Z79899 Other long term (current) drug therapy: Secondary | ICD-10-CM

## 2017-08-12 DIAGNOSIS — M542 Cervicalgia: Secondary | ICD-10-CM | POA: Diagnosis not present

## 2017-08-12 DIAGNOSIS — R51 Headache: Secondary | ICD-10-CM

## 2017-08-12 DIAGNOSIS — Z818 Family history of other mental and behavioral disorders: Secondary | ICD-10-CM

## 2017-08-12 DIAGNOSIS — F411 Generalized anxiety disorder: Secondary | ICD-10-CM

## 2017-08-12 DIAGNOSIS — F332 Major depressive disorder, recurrent severe without psychotic features: Secondary | ICD-10-CM | POA: Diagnosis not present

## 2017-08-12 MED ORDER — DULOXETINE HCL 60 MG PO CPEP: 60.0000 mg | ORAL_CAPSULE | Freq: Two times a day (BID) | ORAL | 2 refills | Status: DC

## 2017-08-12 MED ORDER — CLONAZEPAM 1 MG PO TABS: 1.0000 mg | ORAL_TABLET | Freq: Two times a day (BID) | ORAL | 2 refills | Status: DC
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
## 2017-08-31 ENCOUNTER — Encounter: Payer: Self-pay | Admitting: Family Medicine

## 2017-08-31 ENCOUNTER — Ambulatory Visit: Payer: Medicaid Other | Admitting: Family Medicine

## 2017-08-31 VITALS — BP 159/85 | HR 72 | Temp 98.3°F | Ht 60.0 in | Wt 141.8 lb

## 2017-08-31 DIAGNOSIS — L03119 Cellulitis of unspecified part of limb: Secondary | ICD-10-CM

## 2017-08-31 DIAGNOSIS — L02419 Cutaneous abscess of limb, unspecified: Secondary | ICD-10-CM

## 2017-08-31 MED ORDER — CIPROFLOXACIN HCL 500 MG PO TABS: 500.0000 mg | ORAL_TABLET | Freq: Two times a day (BID) | ORAL | 0 refills | Status: DC

## 2017-08-31 NOTE — Progress Notes (Signed)
Chief Complaint  Patient presents with  . wound of right lower extremity    x 2 weeks. Patient states that it has been having some yellow drainiage coming out of it    HPI  Patient presents today for patient has hit the area of the right shin on objects 2 or 3 times over the last couple weeks.  The most recent being this morning.  It popped open and had some yellow clear drainage from it.  It is painful.  It has not kept her from ambulating.  PMH: Smoking status noted ROS: Per HPI  Objective: BP (!) 159/85   Pulse 72   Temp 98.3 F (36.8 C) (Oral)   Ht 5' (1.524 m)   Wt 141 lb 12.8 oz (64.3 kg)   BMI 27.69 kg/m  Gen: NAD, alert, cooperative with exam HEENT: NCAT Ext: No edema, warm.  There is 6 cm of erythema surrounding a 3 cm skin tear with a crust covering it.    Neuro: Alert and oriented, No gross deficits  Assessment and plan:  1. Cellulitis and abscess of leg     Meds ordered this encounter  Medications  . ciprofloxacin (CIPRO) 500 MG tablet    Sig: Take 1 tablet (500 mg total) by mouth 2 (two) times daily.    Dispense:  20 tablet    Refill:  0    She should cleanse the wound with warm soapy water and rinse with clear water daily.  Pat dry, apply triple antibiotic, cover with gauze and Covan  Follow up 3 days and as needed should the redness spread. Claretta Fraise, MD

## 2017-09-02 ENCOUNTER — Encounter: Payer: Self-pay | Admitting: Nurse Practitioner

## 2017-09-02 ENCOUNTER — Ambulatory Visit: Payer: Medicaid Other | Admitting: Nurse Practitioner

## 2017-09-02 VITALS — BP 130/78 | HR 74 | Temp 98.1°F | Ht 60.0 in | Wt 139.0 lb

## 2017-09-02 DIAGNOSIS — L03119 Cellulitis of unspecified part of limb: Secondary | ICD-10-CM

## 2017-09-02 DIAGNOSIS — L02419 Cutaneous abscess of limb, unspecified: Secondary | ICD-10-CM | POA: Diagnosis not present

## 2017-09-02 NOTE — Patient Instructions (Signed)
Deep Skin Avulsion A deep skin avulsion is a type of open wound. It often results from a severe injury (trauma) that tears away all layers of the skin or an entire body part. The areas of the body that are most often affected by a deep skin avulsion include the face, lips, ears, nose, and fingers. A deep skin avulsion may make structures below the skin become visible. You may be able to see muscle, bone, nerves, and blood vessels. A deep skin avulsion can also damage important structures beneath the skin. These include tendons, ligaments, nerves, or blood vessels. What are the causes? Injuries that often cause a deep skin avulsion include:  Being crushed.  Falling against a jagged surface.  Animal bites.  Gunshot wounds.  Severe burns.  Injuries that involve being dragged, such as bicycle or motorcycle accidents.  What are the signs or symptoms? Symptoms of a deep skin avulsion include:  Pain.  Numbness.  Swelling.  A misshapen body part.  Bleeding, which may be heavy.  Fluid leaking from the wound.  How is this diagnosed? This condition may be diagnosed with a medical history and physical exam. You may also have X-rays done. How is this treated? The treatment that is chosen for a deep skin avulsion depends on how large and deep the wound is and where it is located. Treatment for all types of avulsions usually starts with:  Controlling the bleeding.  Washing out the wound with a germ-free (sterile) salt-water solution.  Removing dead tissue from the wound.  A wound may be closed or left open to heal. This depends on the size and location of the wound and whether it is likely to become infected. Wounds are usually covered or closed if they expose blood vessels, nerves, bone, or cartilage.  Wounds that are small and clean may be closed with stitches (sutures).  Wounds that cannot be closed with sutures may be covered with a piece of skin (graft) or a skin flap. Skin may  be taken from on or near the wound, from another part of the body, or from a donor.  Wounds may be left open if they are hard to close or they may become infected. These wounds heal over time from the bottom up.  You may also receive medicine. This may include:  Antibiotics.  A tetanus shot.  Rabies vaccine.  Follow these instructions at home: Medicines  Take or apply over-the-counter and prescription medicines only as told by your health care provider.  If you were prescribed an antibiotic, take or apply it as told by your health care provider. Do not stop taking the antibiotic even if your condition improves.  You may get anti-itch medicine while your wound is healing. Use it only as told by your health care provider. Wound care  There are many ways to close and cover a wound. For example, a wound can be covered with sutures, skin glue, or adhesive strips. Follow instructions from your health care provider about: ? How to take care of your wound. ? When and how you should change your bandage (dressing). ? When you should remove your dressing. ? Removing whatever was used to close your wound.  Keep the dressing dry as told by your health care provider. Do not take baths, swim, use a hot tub, or do anything that would put your wound underwater until your health care provider approves.  Clean the wound each day or as told by your health care provider. ? Wash  the wound with mild soap and water. ? Rinse the wound with water to remove all soap. ? Pat the wound dry with a clean towel. Do not rub it.  Do not scratch or pick at the wound.  Check your wound every day for signs of infection. Watch for: ? Redness, swelling, or pain. ? Fluid, blood, or pus. General instructions  Raise (elevate) the injured area above the level of your heart while you are sitting or lying down.  Keep all follow-up visits as told by your health care provider. This is important. Contact a health care  provider if:  You received a tetanus shot and you have swelling, severe pain, redness, or bleeding at the injection site.  You have a fever.  Your pain is not controlled with medicine.  You have increased redness, swelling, or pain at the site of your wound.  You have fluid, blood, or pus coming from your wound.  You notice a bad smell coming from your wound or your dressing.  A wound that was closed breaks open.  You notice something coming out of the wound, such as wood or glass.  You notice a change in the color of your skin near your wound.  You develop a new rash.  You need to change the dressing frequently due to fluid, blood, or pus draining from the wound. Get help right away if:  Your pain suddenly increases and is severe.  You develop severe swelling around the wound.  You develop numbness around the wound.  You have nausea and vomiting that does not go away after 24 hours.  You feel light-headed, weak, or faint.  You develop chest pain.  You have trouble breathing.  Your wound is on your hand or foot and you cannot properly move a finger or toe.  The wound is on your hand or foot and you notice that your fingers or toes look pale or bluish.  You have a red streak going away from your wound. This information is not intended to replace advice given to you by your health care provider. Make sure you discuss any questions you have with your health care provider. Document Released: 04/15/2006 Document Revised: 07/26/2015 Document Reviewed: 02/22/2014 Elsevier Interactive Patient Education  [employer].

## 2017-09-02 NOTE — Progress Notes (Signed)
   Subjective:    Patient ID: [name], female    DOB: Sep 22, 1955, 62 y.o.   MRN: [id number]   Chief Complaint: Recheck right leg   HPI Patient was seen in office on Monday July 1 with cellulitis of wound on right lower leg. She was given cipro and wound care was discussed. She has been washing it daily with antibacterial soap and taking her cipro. She says wound is much better.    Review of Systems  Respiratory: Negative.   Cardiovascular: Negative.   Skin: Positive for wound (right lower leg).  All other systems reviewed and are negative.      Objective:   Physical Exam  Constitutional: She is oriented to person, place, and time. She appears well-developed and well-nourished. No distress.  Cardiovascular: Normal rate.  Pulmonary/Chest: Effort normal.  Neurological: She is alert and oriented to person, place, and time.  Skin: Skin is warm.  Wound erythema has improved. Clear drainage noted today.  Psychiatric: She has a normal mood and affect. Her behavior is normal.  Nursing note and vitals reviewed.           Assessment & Plan:  [name] in today with chief complaint of Recheck right leg   1. Cellulitis and abscess of leg Continue wound care Continue cipro till gone RTO prn  Mary-Margaret Hassell Done, FNP

## 2017-09-14 ENCOUNTER — Other Ambulatory Visit: Payer: Self-pay | Admitting: Family Medicine

## 2017-09-14 DIAGNOSIS — I10 Essential (primary) hypertension: Secondary | ICD-10-CM

## 2017-09-14 DIAGNOSIS — E782 Mixed hyperlipidemia: Secondary | ICD-10-CM

## 2017-09-14 DIAGNOSIS — K219 Gastro-esophageal reflux disease without esophagitis: Secondary | ICD-10-CM

## 2017-10-16 ENCOUNTER — Other Ambulatory Visit (HOSPITAL_COMMUNITY): Payer: Self-pay | Admitting: Psychiatry

## 2017-10-16 DIAGNOSIS — F332 Major depressive disorder, recurrent severe without psychotic features: Secondary | ICD-10-CM

## 2017-10-16 DIAGNOSIS — F411 Generalized anxiety disorder: Secondary | ICD-10-CM

## 2017-11-12 ENCOUNTER — Ambulatory Visit (INDEPENDENT_AMBULATORY_CARE_PROVIDER_SITE_OTHER): Payer: Medicaid Other | Admitting: Psychiatry

## 2017-11-12 ENCOUNTER — Encounter (HOSPITAL_COMMUNITY): Payer: Self-pay | Admitting: Psychiatry

## 2017-11-12 DIAGNOSIS — F332 Major depressive disorder, recurrent severe without psychotic features: Secondary | ICD-10-CM | POA: Diagnosis not present

## 2017-11-12 DIAGNOSIS — F411 Generalized anxiety disorder: Secondary | ICD-10-CM

## 2017-11-12 DIAGNOSIS — F17210 Nicotine dependence, cigarettes, uncomplicated: Secondary | ICD-10-CM

## 2017-11-12 DIAGNOSIS — Z6379 Other stressful life events affecting family and household: Secondary | ICD-10-CM

## 2017-11-12 DIAGNOSIS — Z818 Family history of other mental and behavioral disorders: Secondary | ICD-10-CM | POA: Diagnosis not present

## 2017-11-12 DIAGNOSIS — Z811 Family history of alcohol abuse and dependence: Secondary | ICD-10-CM | POA: Diagnosis not present

## 2017-11-12 DIAGNOSIS — Z813 Family history of other psychoactive substance abuse and dependence: Secondary | ICD-10-CM

## 2017-11-12 MED ORDER — CLONAZEPAM 1 MG PO TABS: 1.0000 mg | ORAL_TABLET | Freq: Two times a day (BID) | ORAL | 2 refills | Status: DC

## 2017-11-12 MED ORDER — DULOXETINE HCL 60 MG PO CPEP: 60.0000 mg | ORAL_CAPSULE | Freq: Two times a day (BID) | ORAL | 2 refills | Status: DC
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
## 2017-11-17 ENCOUNTER — Other Ambulatory Visit: Payer: Self-pay | Admitting: Family Medicine

## 2017-11-25 ENCOUNTER — Ambulatory Visit: Payer: Medicaid Other | Admitting: Family Medicine

## 2017-12-16 ENCOUNTER — Encounter: Payer: Self-pay | Admitting: Family Medicine

## 2017-12-16 ENCOUNTER — Ambulatory Visit: Payer: Medicaid Other | Admitting: Family Medicine

## 2017-12-16 VITALS — BP 131/89 | HR 91 | Temp 97.6°F | Ht 60.0 in | Wt 141.0 lb

## 2017-12-16 DIAGNOSIS — F1120 Opioid dependence, uncomplicated: Secondary | ICD-10-CM

## 2017-12-16 DIAGNOSIS — M1990 Unspecified osteoarthritis, unspecified site: Secondary | ICD-10-CM | POA: Diagnosis not present

## 2017-12-16 DIAGNOSIS — J439 Emphysema, unspecified: Secondary | ICD-10-CM

## 2017-12-16 DIAGNOSIS — E782 Mixed hyperlipidemia: Secondary | ICD-10-CM

## 2017-12-16 DIAGNOSIS — Z23 Encounter for immunization: Secondary | ICD-10-CM

## 2017-12-16 DIAGNOSIS — I10 Essential (primary) hypertension: Secondary | ICD-10-CM

## 2017-12-16 DIAGNOSIS — M79676 Pain in unspecified toe(s): Secondary | ICD-10-CM

## 2017-12-16 DIAGNOSIS — Z122 Encounter for screening for malignant neoplasm of respiratory organs: Secondary | ICD-10-CM

## 2017-12-16 DIAGNOSIS — F332 Major depressive disorder, recurrent severe without psychotic features: Secondary | ICD-10-CM

## 2017-12-16 DIAGNOSIS — K219 Gastro-esophageal reflux disease without esophagitis: Secondary | ICD-10-CM

## 2017-12-16 MED ORDER — PANTOPRAZOLE SODIUM 40 MG PO TBEC: 40.0000 mg | DELAYED_RELEASE_TABLET | Freq: Two times a day (BID) | ORAL | 1 refills | Status: DC

## 2017-12-16 MED ORDER — AZITHROMYCIN 250 MG PO TABS: ORAL_TABLET | ORAL | 0 refills | Status: DC

## 2017-12-16 MED ORDER — ATORVASTATIN CALCIUM 40 MG PO TABS: 40.0000 mg | ORAL_TABLET | Freq: Every day | ORAL | 1 refills | Status: DC

## 2017-12-16 MED ORDER — AMLODIPINE BESYLATE 5 MG PO TABS: 5.0000 mg | ORAL_TABLET | Freq: Every day | ORAL | 1 refills | Status: DC

## 2017-12-16 MED ORDER — BUDESONIDE-FORMOTEROL FUMARATE 160-4.5 MCG/ACT IN AERO: INHALATION_SPRAY | RESPIRATORY_TRACT | 5 refills | Status: DC

## 2017-12-16 MED ORDER — DULOXETINE HCL 60 MG PO CPEP: 60.0000 mg | ORAL_CAPSULE | Freq: Two times a day (BID) | ORAL | 2 refills | Status: DC

## 2017-12-16 MED ORDER — LISINOPRIL 20 MG PO TABS: 20.0000 mg | ORAL_TABLET | Freq: Every day | ORAL | 1 refills | Status: DC

## 2017-12-16 MED ORDER — LORATADINE 10 MG PO TABS: 10.0000 mg | ORAL_TABLET | Freq: Every day | ORAL | 5 refills | Status: DC

## 2017-12-16 NOTE — Progress Notes (Signed)
Subjective:  Patient ID: [name], female    DOB: 09/03/1955  Age: 62 y.o. MRN: [id number]  CC: Medical Management of Chronic Issues   HPI [name] presents for increasing amounts of heartburn.  She takes her reflux medicine daily.  It does not seem to be adequate.  She denies any hematemesis or melena.  Pain is moderately severe located in the epigastrium with radiation into the substernal region.   Follow-up of hypertension. Patient has no history of headache chest pain or shortness of breath or recent cough. Patient also denies symptoms of TIA such as numbness weakness lateralizing. Patient checks  blood pressure at home and has not had any elevated readings recently. Patient denies side effects from his medication. States taking it regularly.   Patient does have some cough and congestion.  Cough has not been productive.  She is noted some wheezing.  Depression screen [facility] 2/9 12/16/2017 09/02/2017 04/08/2017  Decreased Interest 0 1 3  Down, Depressed, Hopeless _0 PHQ - 2 Score _1 Altered sleeping - 1 2  Tired, decreased energy - 1 3  Change in appetite - 1 3  Feeling bad or failure about yourself  - 0 2  Trouble concentrating - 1 2  Moving slowly or fidgety/restless - 1 1  Suicidal thoughts - 0 0  PHQ-9 Score - 7 19  Some encounter information is confidential and restricted. Go to Review Flowsheets activity to see all data.  Some recent data might be hidden    History [name] has a past medical history of Arthritis, Cancer ([place]), COPD (chronic obstructive pulmonary disease) ([place]), Coronary artery disease, Depression, adenomatous polyp of colon (10/03/2014), Hypercholesteremia, Hypertension, Panic attacks, Perimenopausal, and Tobacco user.   She has a past surgical history that includes Abdominal hysterectomy; vocal cord; Right foot toe surgery; Colonoscopy; and Upper gastrointestinal endoscopy.   Her family history includes Alcohol abuse in her brother; Anxiety  disorder in her brother and sister; Colon cancer (age of onset: 35) in her brother; Depression in her brother and sister; Drug abuse in her son; Heart attack in her brother, father, and mother; Heart disease in her mother.She reports that she has been smoking cigarettes. She started smoking about 48 years ago. She has a 40.00 pack-year smoking history. She has never used smokeless tobacco. She reports that she does not drink alcohol or use drugs.    ROS Review of Systems  Constitutional: Negative.   HENT: Negative for congestion.   Eyes: Negative for visual disturbance.  Respiratory: Negative for shortness of breath.   Cardiovascular: Positive for chest pain (Midline substernal burning).  Gastrointestinal: Positive for abdominal pain. Negative for constipation, diarrhea, nausea and vomiting.  Genitourinary: Negative for difficulty urinating.  Musculoskeletal: Positive for arthralgias (Multiple joints). Negative for myalgias.  Neurological: Negative for headaches.  Psychiatric/Behavioral: Negative for sleep disturbance.    Objective:  BP 131/89   Pulse 91   Temp 97.6 F (36.4 C) (Oral)   Ht 5' (1.524 m)   Wt 141 lb (64 kg)   BMI 27.54 kg/m   BP Readings from Last 3 Encounters:  12/16/17 131/89  09/02/17 130/78  08/31/17 (!) 159/85    Wt Readings from Last 3 Encounters:  12/16/17 141 lb (64 kg)  09/02/17 139 lb (63 kg)  08/31/17 141 lb 12.8 oz (64.3 kg)     Physical Exam  Constitutional: She is oriented to person, place, and time. She appears well-developed and well-nourished. No  distress.  HENT:  Head: Normocephalic and atraumatic.  Right Ear: External ear normal.  Left Ear: External ear normal.  Nose: Nose normal.  Mouth/Throat: Oropharynx is clear and moist.  Eyes: Pupils are equal, round, and reactive to light. Conjunctivae and EOM are normal.  Neck: Normal range of motion. Neck supple. No thyromegaly present.  Cardiovascular: Normal rate, regular rhythm and  normal heart sounds.  No murmur heard. Pulmonary/Chest: Effort normal. No respiratory distress. She has wheezes (Scattered). She has no rales.  Abdominal: Soft. Bowel sounds are normal. She exhibits no distension. There is tenderness (Epigastric).  Lymphadenopathy:    She has no cervical adenopathy.  Neurological: She is alert and oriented to person, place, and time. She has normal reflexes.  Skin: Skin is warm and dry.  Psychiatric: She has a normal mood and affect. Her behavior is normal. Judgment and thought content normal.      Assessment & Plan:   [name] was seen today for medical management of chronic issues.  Diagnoses and all orders for this visit:  Essential hypertension -     CBC with Differential/Platelet -     CMP14+EGFR -     Lipid panel -     lisinopril (PRINIVIL,ZESTRIL) 20 MG tablet; Take 1 tablet (20 mg total) by mouth daily. -     amLODipine (NORVASC) 5 MG tablet; Take 1 tablet (5 mg total) by mouth daily.  Need for immunization against influenza -     Flu Vaccine QUAD 36+ mos IM  Osteoarthritis, unspecified osteoarthritis type, unspecified site  Uncomplicated opioid dependence ([place])  Pulmonary emphysema, unspecified emphysema type ([facility]) -     budesonide-formoterol (SYMBICORT) 160-4.5 MCG/ACT inhaler; INHALE TWO PUFFS INTO THE LUNGS TWICE DAILY  Mixed hyperlipidemia -     Lipid panel -     atorvastatin (LIPITOR) 40 MG tablet; Take 1 tablet (40 mg total) by mouth at bedtime.  Encounter for screening for malignant neoplasm of respiratory organs -     CT CHEST LUNG CA SCREEN LOW DOSE W/O CM; Future  Pain of fifth toe -     Ambulatory referral to Orthopedics  Gastroesophageal reflux disease without esophagitis -     Discontinue: pantoprazole (PROTONIX) 40 MG tablet; Take 1 tablet (40 mg total) by mouth 2 (two) times daily. -     pantoprazole (PROTONIX) 40 MG tablet; Take 1 tablet (40 mg total) by mouth 2 (two) times daily.  Severe episode of recurrent  major depressive disorder, without psychotic features ([place]) -     DULoxetine (CYMBALTA) 60 MG capsule; Take 1 capsule (60 mg total) by mouth 2 (two) times daily.  Other orders -     azithromycin (ZITHROMAX Z-PAK) 250 MG tablet; Take two right away Then one a day for the next 4 days. -     loratadine (CLARITIN) 10 MG tablet; Take 1 tablet (10 mg total) by mouth daily.       I have discontinued [name]'s ciprofloxacin. I have also changed her loratadine, lisinopril, atorvastatin, and amLODipine. Additionally, I am having her start on azithromycin. Lastly, I am having her maintain her aspirin EC, fish oil-omega-3 fatty acids, vitamin C, multivitamin, Oxycodone HCl, clonazePAM, pantoprazole, DULoxetine, and budesonide-formoterol.  Allergies as of 12/16/2017      Reactions   Lorcet 10-650 [hydrocodone-acetaminophen] Itching      Medication List        Accurate as of 12/16/17 11:59 PM. Always use your most recent med list.  amLODipine 5 MG tablet Commonly known as:  NORVASC Take 1 tablet (5 mg total) by mouth daily.   aspirin EC 81 MG tablet Take 81 mg by mouth daily.   atorvastatin 40 MG tablet Commonly known as:  LIPITOR Take 1 tablet (40 mg total) by mouth at bedtime.   azithromycin 250 MG tablet Commonly known as:  ZITHROMAX Take two right away Then one a day for the next 4 days.   budesonide-formoterol 160-4.5 MCG/ACT inhaler Commonly known as:  SYMBICORT INHALE TWO PUFFS INTO THE LUNGS TWICE DAILY   clonazePAM 1 MG tablet Commonly known as:  KLONOPIN Take 1 tablet (1 mg total) by mouth 2 (two) times daily.   DULoxetine 60 MG capsule Commonly known as:  CYMBALTA Take 1 capsule (60 mg total) by mouth 2 (two) times daily.   fish oil-omega-3 fatty acids 1000 MG capsule Take 1 g by mouth daily.   lisinopril 20 MG tablet Commonly known as:  PRINIVIL,ZESTRIL Take 1 tablet (20 mg total) by mouth daily.   loratadine 10 MG tablet Commonly known as:   CLARITIN Take 1 tablet (10 mg total) by mouth daily.   multivitamin tablet Take 1 tablet by mouth daily.   Oxycodone HCl 10 MG Tabs TAKE ONE TABLET BY MOUTH EVERY 4 TO 6 HOURS AS NEEDED FOR PAIN   pantoprazole 40 MG tablet Commonly known as:  PROTONIX Take 1 tablet (40 mg total) by mouth 2 (two) times daily.   vitamin C 1000 MG tablet Take 1,000 mg by mouth daily.        Follow-up: Return in about 6 months (around 06/17/2018).  Claretta Fraise, M.D.

## 2017-12-21 ENCOUNTER — Encounter: Payer: Self-pay | Admitting: Family Medicine

## 2017-12-28 ENCOUNTER — Encounter: Payer: Self-pay | Admitting: Internal Medicine

## 2018-01-04 ENCOUNTER — Encounter: Payer: Self-pay | Admitting: Internal Medicine

## 2018-01-07 ENCOUNTER — Ambulatory Visit (HOSPITAL_COMMUNITY)
Admission: RE | Admit: 2018-01-07 | Discharge: 2018-01-07 | Disposition: A | Discharge status: Home / Self Care | Payer: Medicaid Other | Source: Ambulatory Visit | Attending: Family Medicine | Admitting: Family Medicine

## 2018-01-07 DIAGNOSIS — Z122 Encounter for screening for malignant neoplasm of respiratory organs: Secondary | ICD-10-CM | POA: Insufficient documentation

## 2018-01-07 DIAGNOSIS — I700 Atherosclerosis of aorta: Secondary | ICD-10-CM | POA: Insufficient documentation

## 2018-01-07 DIAGNOSIS — J438 Other emphysema: Secondary | ICD-10-CM | POA: Insufficient documentation

## 2018-01-07 DIAGNOSIS — I2510 Atherosclerotic heart disease of native coronary artery without angina pectoris: Secondary | ICD-10-CM | POA: Insufficient documentation

## 2018-01-07 DIAGNOSIS — R911 Solitary pulmonary nodule: Secondary | ICD-10-CM | POA: Insufficient documentation

## 2018-01-07 DIAGNOSIS — J432 Centrilobular emphysema: Secondary | ICD-10-CM | POA: Diagnosis not present

## 2018-01-07 DIAGNOSIS — I358 Other nonrheumatic aortic valve disorders: Secondary | ICD-10-CM | POA: Insufficient documentation

## 2018-01-07 DIAGNOSIS — F17210 Nicotine dependence, cigarettes, uncomplicated: Secondary | ICD-10-CM | POA: Insufficient documentation

## 2018-01-08 ENCOUNTER — Other Ambulatory Visit: Payer: Self-pay | Admitting: Family Medicine

## 2018-01-08 DIAGNOSIS — I2584 Coronary atherosclerosis due to calcified coronary lesion: Principal | ICD-10-CM

## 2018-01-08 DIAGNOSIS — I2510 Atherosclerotic heart disease of native coronary artery without angina pectoris: Secondary | ICD-10-CM

## 2018-01-11 ENCOUNTER — Ambulatory Visit (AMBULATORY_SURGERY_CENTER): Payer: Self-pay | Admitting: *Deleted

## 2018-01-11 DIAGNOSIS — Z800 Family history of malignant neoplasm of digestive organs: Secondary | ICD-10-CM

## 2018-01-11 DIAGNOSIS — Z86010 Personal history of colonic polyps: Secondary | ICD-10-CM

## 2018-01-11 MED ORDER — NA SULFATE-K SULFATE-MG SULF 17.5-3.13-1.6 GM/177ML PO SOLN: 1.0000 | Freq: Once | ORAL | 0 refills | Status: AC

## 2018-01-11 MED ORDER — BISACODYL 5 MG PO TBEC: 5.0000 mg | DELAYED_RELEASE_TABLET | Freq: Every day | ORAL | 0 refills | Status: DC | PRN

## 2018-01-11 MED ORDER — POLYETHYLENE GLYCOL 3350 17 GM/SCOOP PO POWD: 1.0000 | Freq: Every day | ORAL | 3 refills | Status: DC

## 2018-01-11 NOTE — Progress Notes (Signed)
No egg or soy allergy known to patient  No issues with past sedation with any surgeries  or procedures, no intubation problems  No diet pills per patient No home 02 use per patient  No blood thinners per patient  Pt denies issues with constipation  No A fib or A flutter  EMMI video sent to pt's e mail  - pt declined  2 day Suprep miralax prep due to constipation and fair prep last colon 2016

## 2018-01-21 ENCOUNTER — Encounter: Payer: Self-pay | Admitting: Internal Medicine

## 2018-01-21 ENCOUNTER — Ambulatory Visit (AMBULATORY_SURGERY_CENTER): Payer: Medicaid Other | Admitting: Internal Medicine

## 2018-01-21 VITALS — BP 116/69 | HR 78 | Temp 97.7°F | Resp 11 | Ht 65.0 in | Wt 143.0 lb

## 2018-01-21 DIAGNOSIS — Z1211 Encounter for screening for malignant neoplasm of colon: Secondary | ICD-10-CM

## 2018-01-21 DIAGNOSIS — K5730 Diverticulosis of large intestine without perforation or abscess without bleeding: Secondary | ICD-10-CM

## 2018-01-21 DIAGNOSIS — Z860100 Personal history of colon polyps, unspecified: Secondary | ICD-10-CM

## 2018-01-21 DIAGNOSIS — Z800 Family history of malignant neoplasm of digestive organs: Secondary | ICD-10-CM

## 2018-01-21 MED ORDER — SODIUM CHLORIDE 0.9 % IV SOLN: 500.0000 mL | Freq: Once | INTRAVENOUS | Status: DC

## 2018-01-21 NOTE — Progress Notes (Signed)
Pt's states no medical or surgical changes since previsit or office visit. 

## 2018-01-21 NOTE — Op Note (Addendum)
[facility] Patient Name: [name] Procedure Date: 01/21/2018 9:14 AM MRN: [id number] Endoscopist: Gatha Mayer , MD Age: 62 Referring MD:  Date of Birth: 05/06/1955 Gender: Female Account #: [id number] Procedure:                Colonoscopy Indications:              Surveillance: Personal history of adenomatous                            polyps on last colonoscopy 5 years ago + FHx colon                            cancer brother < 75 Medicines:                Propofol per Anesthesia, Monitored Anesthesia Care Procedure:                Pre-Anesthesia Assessment:                           - Prior to the procedure, a History and Physical                            was performed, and patient medications and                            allergies were reviewed. The patient's tolerance of                            previous anesthesia was also reviewed. The risks                            and benefits of the procedure and the sedation                            options and risks were discussed with the patient.                            All questions were answered, and informed consent                            was obtained. Prior Anticoagulants: The patient has                            taken no previous anticoagulant or antiplatelet                            agents. ASA Grade Assessment: III - A patient with                            severe systemic disease. After reviewing the risks                            and benefits, the patient was deemed in  satisfactory condition to undergo the procedure.                           After obtaining informed consent, the colonoscope                            was passed under direct vision. Throughout the                            procedure, the patient's blood pressure, pulse, and                            oxygen saturations were monitored continuously. The                            Model  [id number]-[id number]) scope was introduced                            through the anus and advanced to the the cecum,                            identified by appendiceal orifice and ileocecal                            valve. The colonoscopy was performed without                            difficulty. The patient tolerated the procedure                            well. The quality of the bowel preparation was                            good. The ileocecal valve, appendiceal orifice, and                            rectum were photographed. The bowel preparation                            used was Miralax. Scope In: 9:27:48 AM Scope Out: 9:45:50 AM Scope Withdrawal Time: 0 hours 12 minutes 54 seconds  Total Procedure Duration: 0 hours 18 minutes 2 seconds  Findings:                 The perianal and digital rectal examinations were                            normal.                           Multiple diverticula were found in the sigmoid                            colon. There was narrowing of the colon in  association with the diverticular opening.                           The exam was otherwise without abnormality on                            direct and retroflexion views. Complications:            No immediate complications. Estimated Blood Loss:     Estimated blood loss: none. Impression:               - Severe diverticulosis in the sigmoid colon. There                            was narrowing of the colon in association with the                            diverticular opening.                           - The examination was otherwise normal on direct                            and retroflexion views.                           - No specimens collected.                           - Personal history of colonic polyp - adenoma 2014                            and family history of colon cancer in brother < 14. Recommendation:           - Patient has a contact  number available for                            emergencies. The signs and symptoms of potential                            delayed complications were discussed with the                            patient. Return to normal activities tomorrow.                            Written discharge instructions were provided to the                            patient.                           - Resume previous diet.                           - Continue present medications.                           -  Repeat colonoscopy in 5 years for screening                            purposes and for surveillance. Gatha Mayer, MD 01/21/2018 9:52:50 AM This report has been signed electronically.

## 2018-01-21 NOTE — Progress Notes (Signed)
PT taken to PACU. Monitors in place. VSS. Report given to RN. 

## 2018-01-21 NOTE — Progress Notes (Signed)
Pt locked keys in car, awaiting for [facility] to come unlock keys in the car. Pt is waiting in recovery via wc.

## 2018-01-21 NOTE — Patient Instructions (Addendum)
  No polyps.  You do have diverticulosis - thickened muscle rings and pouches in the colon wall. Please read the handout about this condition.  Your next routine colonoscopy should be in 5 years - 2024.  I appreciate the opportunity to care for you. Gatha Mayer, MD, FACG YOU HAD AN ENDOSCOPIC PROCEDURE TODAY AT [facility]:   Refer to the procedure report that was given to you for any specific questions about what was found during the examination.  If the procedure report does not answer your questions, please call your gastroenterologist to clarify.  If you requested that your care partner not be given the details of your procedure findings, then the procedure report has been included in a sealed envelope for you to review at your convenience later.  YOU SHOULD EXPECT: Some feelings of bloating in the abdomen. Passage of more gas than usual.  Walking can help get rid of the air that was put into your GI tract during the procedure and reduce the bloating. If you had a lower endoscopy (such as a colonoscopy or flexible sigmoidoscopy) you may notice spotting of blood in your stool or on the toilet paper. If you underwent a bowel prep for your procedure, you may not have a normal bowel movement for a few days.  Please Note:  You might notice some irritation and congestion in your nose or some drainage.  This is from the oxygen used during your procedure.  There is no need for concern and it should clear up in a day or so.  SYMPTOMS TO REPORT IMMEDIATELY:   Following lower endoscopy (colonoscopy or flexible sigmoidoscopy):  Excessive amounts of blood in the stool  Significant tenderness or worsening of abdominal pains  Swelling of the abdomen that is new, acute  Fever of 100F or higher  For urgent or emergent issues, a gastroenterologist can be reached at any hour by calling [phone].   DIET:  We do recommend a small meal at first, but then you may proceed to  your regular diet.  Drink plenty of fluids but you should avoid alcoholic beverages for 24 hours.  ACTIVITY:  You should plan to take it easy for the rest of today and you should NOT DRIVE or use heavy machinery until tomorrow (because of the sedation medicines used during the test).    FOLLOW UP: Our staff will call the number listed on your records the next business day following your procedure to check on you and address any questions or concerns that you may have regarding the information given to you following your procedure. If we do not reach you, we will leave a message.  However, if you are feeling well and you are not experiencing any problems, there is no need to return our call.  We will assume that you have returned to your regular daily activities without incident.  If any biopsies were taken you will be contacted by phone or by letter within the next 1-3 weeks.  Please call us at [phone] if you have not heard about the biopsies in 3 weeks.    SIGNATURES/CONFIDENTIALITY: You and/or your care partner have signed paperwork which will be entered into your electronic medical record.  These signatures attest to the fact that that the information above on your After Visit Summary has been reviewed and is understood.  Full responsibility of the confidentiality of this discharge information lies with you and/or your care-partner.

## 2018-01-22 ENCOUNTER — Telehealth: Payer: Self-pay

## 2018-01-22 NOTE — Telephone Encounter (Signed)
Follow up call, left a message.

## 2018-01-22 NOTE — Telephone Encounter (Signed)
  Follow up Call-  Call back number 01/21/2018  Post procedure Call Back phone  # [phone]  Permission to leave phone message Yes  Some recent data might be hidden     Patient questions:  Do you have a fever, pain , or abdominal swelling? No. Pain Score  0 *  Have you tolerated food without any problems? Yes.    Have you been able to return to your normal activities? Yes.    Do you have any questions about your discharge instructions: Diet   No. Medications  No. Follow up visit  No.  Do you have questions or concerns about your Care? No.  Actions: * If pain score is 4 or above: No action needed, pain <4.

## 2018-02-10 ENCOUNTER — Encounter: Payer: Self-pay | Admitting: Cardiology

## 2018-02-10 ENCOUNTER — Encounter: Payer: Self-pay | Admitting: *Deleted

## 2018-02-10 ENCOUNTER — Ambulatory Visit: Payer: Medicaid Other | Admitting: Cardiology

## 2018-02-10 ENCOUNTER — Telehealth: Payer: Self-pay | Admitting: Cardiology

## 2018-02-10 VITALS — BP 144/79 | HR 86 | Ht 65.0 in | Wt 143.6 lb

## 2018-02-10 DIAGNOSIS — R079 Chest pain, unspecified: Secondary | ICD-10-CM | POA: Diagnosis not present

## 2018-02-10 DIAGNOSIS — R0602 Shortness of breath: Secondary | ICD-10-CM | POA: Diagnosis not present

## 2018-02-10 DIAGNOSIS — I2510 Atherosclerotic heart disease of native coronary artery without angina pectoris: Secondary | ICD-10-CM | POA: Diagnosis not present

## 2018-02-10 NOTE — Progress Notes (Signed)
Clinical Summary Ms. Kamaka is a 62 y.o.female last seen today as a new consult. Last seen as outpatient in our practice in 2014, I had seen her once in the hospital in 2015. Referrerd for CAD.   1. Atypical chest pain/CAD - long history of atypical chest pain - cath 2008 nonobstrucive CAD - 07/2013 nuclear stress: no ischemia 01/2018 CT for lung cancer screening showed aortic atherosclerosis, with LM disease, LAD, and LCX   - pain left chest, pressure pain. Can occur usually at rest, most commonly with laying down. Can be diaphoretic. Pain last a few minutes. Can be positional at times. SImilar to her chronic pain.  - DOE with walking to mailbox x 1 year. +coughing/ sweezing.   CAD risk factors: hyperlipidemia, HTN, +tobacco, both parents MIs in 8 to 104s, brother MI in his 89s     Past Medical History:  Diagnosis Date  . Allergy   . Arthritis   . Constipation    uses OTC meds with help  . COPD (chronic obstructive pulmonary disease) ([place])   . Coronary artery disease    minimal nonobstructive   . Depression   . GERD (gastroesophageal reflux disease)   . Hx of adenomatous polyp of colon 10/03/2014  . Hypercholesteremia   . Hypertension   . Panic attacks   . Perimenopausal   . Skin cancer   . Tobacco user      Allergies  Allergen Reactions  . Lorcet 10-650 [Hydrocodone-Acetaminophen] Itching  . Tape     Please use paper tape     Current Outpatient Medications  Medication Sig Dispense Refill  . amLODipine (NORVASC) 5 MG tablet Take 1 tablet (5 mg total) by mouth daily. 90 tablet 1  . Ascorbic Acid (VITAMIN C) 1000 MG tablet Take 1,000 mg by mouth daily.    Marland Kitchen aspirin EC 81 MG tablet Take 81 mg by mouth daily.    Marland Kitchen atorvastatin (LIPITOR) 40 MG tablet Take 1 tablet (40 mg total) by mouth at bedtime. 90 tablet 1  . azithromycin (ZITHROMAX Z-PAK) 250 MG tablet Take two right away Then one a day for the next 4 days. (Patient not taking: Reported on 01/21/2018) 6 each  0  . bisacodyl (DULCOLAX) 5 MG EC tablet Take 1 tablet (5 mg total) by mouth daily as needed for moderate constipation. 4 tablet 0  . budesonide-formoterol (SYMBICORT) 160-4.5 MCG/ACT inhaler INHALE TWO PUFFS INTO THE LUNGS TWICE DAILY 30.6 g 5  . clonazePAM (KLONOPIN) 1 MG tablet Take 1 tablet (1 mg total) by mouth 2 (two) times daily. 60 tablet 2  . DULoxetine (CYMBALTA) 60 MG capsule Take 1 capsule (60 mg total) by mouth 2 (two) times daily. 60 capsule 2  . fish oil-omega-3 fatty acids 1000 MG capsule Take 1 g by mouth daily.     Marland Kitchen lisinopril (PRINIVIL,ZESTRIL) 20 MG tablet Take 1 tablet (20 mg total) by mouth daily. 90 tablet 1  . loratadine (CLARITIN) 10 MG tablet Take 1 tablet (10 mg total) by mouth daily. 30 tablet 5  . meloxicam (MOBIC) 15 MG tablet Take 15 mg by mouth daily.  3  . Multiple Vitamin (MULTIVITAMIN) tablet Take 1 tablet by mouth daily.    . Oxycodone HCl 10 MG TABS TAKE ONE TABLET BY MOUTH EVERY 4 TO 6 HOURS AS NEEDED FOR PAIN  0  . pantoprazole (PROTONIX) 40 MG tablet Take 1 tablet (40 mg total) by mouth 2 (two) times daily. 180 tablet 1  .  polyethylene glycol powder (GLYCOLAX/MIRALAX) powder Take 255 g by mouth daily. 255 g 3   Current Facility-Administered Medications  Medication Dose Route Frequency Provider Last Rate Last Dose  . 0.9 %  sodium chloride infusion  500 mL Intravenous Once Gatha Mayer, MD         Past Surgical History:  Procedure Laterality Date  . ABDOMINAL HYSTERECTOMY    . COLONOSCOPY    . Right foot toe surgery    . UPPER GASTROINTESTINAL ENDOSCOPY    . vocal cord     polyp removal     Allergies  Allergen Reactions  . Lorcet 10-650 [Hydrocodone-Acetaminophen] Itching  . Tape     Please use paper tape      Family History  Problem Relation Age of Onset  . Heart disease Mother   . Heart attack Mother   . Heart attack Father   . Heart attack Brother   . Colon cancer Brother 40       died at age 25  . Anxiety disorder Brother    . Depression Brother   . Alcohol abuse Brother   . Colon polyps Brother   . Colon polyps Brother   . Colon polyps Sister   . Esophageal cancer Sister   . Anxiety disorder Sister   . Depression Sister   . Colon polyps Sister   . Drug abuse Son   . Rectal cancer Neg Hx   . Stomach cancer Neg Hx      Social History Ms. Bunyan reports that she has been smoking cigarettes. She started smoking about 48 years ago. She has a 40.00 pack-year smoking history. She has never used smokeless tobacco. Ms. Mcphearson reports that she does not drink alcohol.   Review of Systems CONSTITUTIONAL: No weight loss, fever, chills, weakness or fatigue.  HEENT: Eyes: No visual loss, blurred vision, double vision or yellow sclerae.No hearing loss, sneezing, congestion, runny nose or sore throat.  SKIN: No rash or itching.  CARDIOVASCULAR: per hpi RESPIRATORY: per hpi GASTROINTESTINAL: No anorexia, nausea, vomiting or diarrhea. No abdominal pain or blood.  GENITOURINARY: No burning on urination, no polyuria NEUROLOGICAL: No headache, dizziness, syncope, paralysis, ataxia, numbness or tingling in the extremities. No change in bowel or bladder control.  MUSCULOSKELETAL: No muscle, back pain, joint pain or stiffness.  LYMPHATICS: No enlarged nodes. No history of splenectomy.  PSYCHIATRIC: No history of depression or anxiety.  ENDOCRINOLOGIC: No reports of sweating, cold or heat intolerance. No polyuria or polydipsia.  Marland Kitchen   Physical Examination Vitals:   02/10/18 1337  BP: (!) 144/79  Pulse: 86  SpO2: 98%   Vitals:   02/10/18 1337  Weight: 143 lb 9.6 oz (65.1 kg)  Height: 5\' 5"  (1.651 m)    Gen: resting comfortably, no acute distress HEENT: no scleral icterus, pupils equal round and reactive, no palptable cervical adenopathy,  CV: RRR, no m/r/g, no jvd Resp: Clear to auscultation bilaterally GI: abdomen is soft, non-tender, non-distended, normal bowel sounds, no hepatosplenomegaly MSK: extremities  are warm, no edema.  Skin: warm, no rash Neuro:  no focal deficits Psych: appropriate affect   Diagnostic Studies  06/2006 cath FINDINGS:  Aortic pressure 125/78 with a mean of 99. Left ventricular  pressure 125/5 with an end-diastolic pressure of 10.   The left mainstem is angiographically normal.  It bifurcates into the  LAD and left circumflex.   The LAD is a large-caliber vessel that courses down to the LV apex.  It  gives  off a first diagonal Bernarr Longsworth and is a large vessel.  There is no  significant disease in the LAD or diagonal system.   Left circumflex is a large-caliber vessel.  There is a small  intermediate Woods Gangemi present.  There are really no substantial obtuse  marginal branches from the left circumflex present.  The left circumflex  courses down the AV groove and gives off a large posterolateral Jadia Capers.  The mid portion of the left circumflex has a long segment of 30-40%  stenosis.   The right coronary artery is a large-caliber vessel.  It terminates  distally in a PDA and posterior AV segment which gives off two  posterolateral branches.  The mid portion of the right coronary artery  provides a small RV marginal Art Levan, and the proximal portion has a  small conus Isiaha Greenup present.  There are minor luminal irregularities in  the proximal portion of the right coronary artery, but there is no  significant angiographic disease.   Left ventriculography performed in the 30-degree RAO projection  demonstrates normal left ventricular systolic function with an LVEF of  55%.  There is no mitral regurgitation.   ASSESSMENT:  1. Nonobstructive coronary artery disease, predominantly involving the      left circumflex system.  2. Normal left ventricular function.  3. Intracoronary vascular ultrasound demonstration of minimal plaque      in the right coronary artery.   PLAN:  Ms. Leeder will require aggressive medical therapy.  She needs  tobacco cessation and  progressive statin therapy.  She will receive  her statin therapy through the [facility] protocol.  She should be on a  daily aspirin as well.  She will be a candidate for early discharge as  her chest pain appears to have been noncardiac, and she has had no  further pain.   Assessment and Plan  1. CAD/SOB/Atypical chest pain - long history of atypical chest pain - nonobstructive CAD by cath 2008, negative nuclear stress 2015 - recent lung cancer screen CT shows LM disease along with LAD and LCX disease - ongoing atypical chest pain, worsening DOE unclear if potentially cardiac or related to her COPD - given the suggested location of disease from CT including left main disease and her symptoms will further risk stratify with a lexiscan -EKG today shows SR, no ischemic changes  F/u pending stress results      Arnoldo Lenis, M.D.

## 2018-02-10 NOTE — Patient Instructions (Addendum)
Medication Instructions:   Your physician recommends that you continue on your current medications as directed. Please refer to the Current Medication list given to you today.  Labwork:  NONE  Testing/Procedures: Your physician has requested that you have a lexiscan myoview. For further information please visit [email or web address]. Please follow instruction sheet, as given.  Follow-Up:  Your physician recommends that you schedule a follow-up appointment in: pending test result.  Any Other Special Instructions Will Be Listed Below (If Applicable).  If you need a refill on your cardiac medications before your next appointment, please call your pharmacy. 

## 2018-02-10 NOTE — Telephone Encounter (Signed)
Pre-cert Verification for the following procedure   Lexiscan scheduled for 02-12-2018 at [facility]

## 2018-02-10 NOTE — Addendum Note (Signed)
Addended by: Laurine Blazer on: 02/10/2018 03:43 PM   Modules accepted: Orders

## 2018-02-11 ENCOUNTER — Encounter (HOSPITAL_COMMUNITY): Payer: Self-pay | Admitting: Psychiatry

## 2018-02-11 ENCOUNTER — Ambulatory Visit (INDEPENDENT_AMBULATORY_CARE_PROVIDER_SITE_OTHER): Payer: Medicaid Other | Admitting: Psychiatry

## 2018-02-11 DIAGNOSIS — F411 Generalized anxiety disorder: Secondary | ICD-10-CM

## 2018-02-11 DIAGNOSIS — F332 Major depressive disorder, recurrent severe without psychotic features: Secondary | ICD-10-CM

## 2018-02-11 MED ORDER — DULOXETINE HCL 60 MG PO CPEP: 60.0000 mg | ORAL_CAPSULE | Freq: Two times a day (BID) | ORAL | 2 refills | Status: DC

## 2018-02-11 MED ORDER — CLONAZEPAM 1 MG PO TABS: 1.0000 mg | ORAL_TABLET | Freq: Two times a day (BID) | ORAL | 2 refills | Status: DC
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
## 2018-02-12 ENCOUNTER — Encounter (HOSPITAL_COMMUNITY): Payer: Medicaid Other

## 2018-02-12 ENCOUNTER — Encounter (HOSPITAL_COMMUNITY): Payer: Self-pay

## 2018-02-19 ENCOUNTER — Ambulatory Visit (HOSPITAL_COMMUNITY): Payer: Medicaid Other

## 2018-02-19 ENCOUNTER — Encounter (HOSPITAL_COMMUNITY): Payer: Self-pay

## 2018-03-11 ENCOUNTER — Encounter (HOSPITAL_COMMUNITY): Payer: Self-pay

## 2018-03-11 ENCOUNTER — Ambulatory Visit (HOSPITAL_COMMUNITY)
Admission: RE | Admit: 2018-03-11 | Discharge: 2018-03-11 | Disposition: A | Discharge status: Home / Self Care | Payer: Medicaid Other | Source: Ambulatory Visit | Attending: Cardiology | Admitting: Cardiology

## 2018-03-11 DIAGNOSIS — R0602 Shortness of breath: Secondary | ICD-10-CM | POA: Diagnosis not present

## 2018-03-11 DIAGNOSIS — R079 Chest pain, unspecified: Secondary | ICD-10-CM

## 2018-03-11 LAB — NM MYOCAR MULTI W/SPECT W/WALL MOTION / EF
LV dias vol: 68 mL (ref 46–106)
LV sys vol: 28 mL
NUC STRESS TID: 1.04
Peak HR: 102 {beats}/min
RATE: 0.28
Rest HR: 74 {beats}/min
SDS: 1
SRS: 4
SSS: 5

## 2018-03-11 MED ORDER — SODIUM CHLORIDE 0.9% FLUSH
INTRAVENOUS | Status: AC
  Administered 2018-03-11: 10 mL via INTRAVENOUS
  Filled 2018-03-11: qty 10

## 2018-03-11 MED ORDER — TECHNETIUM TC 99M TETROFOSMIN IV KIT
30.0000 | PACK | Freq: Once | INTRAVENOUS | Status: AC | PRN
  Administered 2018-03-11: 30 via INTRAVENOUS

## 2018-03-11 MED ORDER — TECHNETIUM TC 99M TETROFOSMIN IV KIT
10.0000 | PACK | Freq: Once | INTRAVENOUS | Status: AC | PRN
  Administered 2018-03-11: 11 via INTRAVENOUS

## 2018-03-11 MED ORDER — REGADENOSON 0.4 MG/5ML IV SOLN
INTRAVENOUS | Status: AC
  Administered 2018-03-11: 0.4 mg via INTRAVENOUS
  Filled 2018-03-11: qty 5

## 2018-03-17 ENCOUNTER — Telehealth: Payer: Self-pay | Admitting: *Deleted

## 2018-03-17 NOTE — Telephone Encounter (Signed)
Pt aware - 4 month f/u scheduled - routed to pcp

## 2018-03-17 NOTE — Telephone Encounter (Signed)
-----   Message from Arnoldo Lenis, MD sent at 03/15/2018  3:23 PM EST ----- Stress test overall looks good, no evidence of any significant blockages.F/u 4 months  Zandra Abts MD

## 2018-04-13 ENCOUNTER — Ambulatory Visit: Payer: Medicaid Other | Admitting: Family Medicine

## 2018-04-13 ENCOUNTER — Encounter: Payer: Self-pay | Admitting: Family Medicine

## 2018-04-13 VITALS — BP 152/85 | HR 90 | Temp 98.1°F | Ht 65.0 in | Wt 139.6 lb

## 2018-04-13 DIAGNOSIS — J40 Bronchitis, not specified as acute or chronic: Secondary | ICD-10-CM | POA: Diagnosis not present

## 2018-04-13 DIAGNOSIS — R6883 Chills (without fever): Secondary | ICD-10-CM | POA: Diagnosis not present

## 2018-04-13 DIAGNOSIS — J329 Chronic sinusitis, unspecified: Secondary | ICD-10-CM

## 2018-04-13 LAB — VERITOR FLU A/B WAIVED
Influenza A: NEGATIVE
Influenza B: NEGATIVE

## 2018-04-13 MED ORDER — LEVOFLOXACIN 500 MG PO TABS: 500.0000 mg | ORAL_TABLET | Freq: Every day | ORAL | 0 refills | Status: DC

## 2018-04-13 MED ORDER — BENZONATATE 200 MG PO CAPS: 200.0000 mg | ORAL_CAPSULE | Freq: Three times a day (TID) | ORAL | 0 refills | Status: DC | PRN

## 2018-04-13 NOTE — Progress Notes (Signed)
Chief Complaint  Patient presents with  . Cough  . Headache  . Chills    HPI  Patient presents today for Patient presents with upper respiratory congestion.  There is moderate sore throat. Patient reports coughing frequently as well.  Yellow to green  sputum noted. There is subjective fever, with chills and  sweats. The patient denies being short of breath. Onset was three weeks ago. Gradually worsening. Tried OTCs without improvement.  PMH: Smoking status noted ROS: Per HPI  Objective: BP (!) 152/85   Pulse 90   Temp 98.1 F (36.7 C) (Oral)   Ht 5\' 5"  (1.651 m)   Wt 139 lb 9.6 oz (63.3 kg)   BMI 23.23 kg/m  Gen: NAD, alert, cooperative with exam HEENT: NCAT, Nasal passages swollen, red TMS RED CV: RRR, good S1/S2, no murmur Resp: Bronchitis changes with scattered wheezes, non-labored Ext: No edema, warm Neuro: Alert and oriented, No gross deficits  Assessment and plan:  1. Sinobronchitis   2. Chills     Meds ordered this encounter  Medications  . levofloxacin (LEVAQUIN) 500 MG tablet    Sig: Take 1 tablet (500 mg total) by mouth daily. For 10 days    Dispense:  10 tablet    Refill:  0  . benzonatate (TESSALON) 200 MG capsule    Sig: Take 1 capsule (200 mg total) by mouth 3 (three) times daily as needed for cough.    Dispense:  20 capsule    Refill:  0    Orders Placed This Encounter  Procedures  . Veritor Flu A/B Waived    Order Specific Question:   Source    Answer:   nasal    Follow up as needed.  Claretta Fraise, MD

## 2018-04-14 ENCOUNTER — Other Ambulatory Visit (HOSPITAL_COMMUNITY): Payer: Self-pay | Admitting: Psychiatry

## 2018-04-14 DIAGNOSIS — F411 Generalized anxiety disorder: Secondary | ICD-10-CM

## 2018-04-14 DIAGNOSIS — F332 Major depressive disorder, recurrent severe without psychotic features: Secondary | ICD-10-CM

## 2018-04-28 ENCOUNTER — Encounter: Payer: Self-pay | Admitting: Family Medicine

## 2018-04-28 ENCOUNTER — Ambulatory Visit: Payer: Medicaid Other | Admitting: Family Medicine

## 2018-04-28 VITALS — BP 134/66 | HR 105 | Temp 98.4°F | Ht 65.0 in | Wt 137.5 lb

## 2018-04-28 DIAGNOSIS — J441 Chronic obstructive pulmonary disease with (acute) exacerbation: Secondary | ICD-10-CM

## 2018-04-28 MED ORDER — BETAMETHASONE SOD PHOS & ACET 6 (3-3) MG/ML IJ SUSP: 6.0000 mg | Freq: Once | INTRAMUSCULAR | Status: DC

## 2018-04-28 MED ORDER — ALBUTEROL SULFATE HFA 108 (90 BASE) MCG/ACT IN AERS: 2.0000 | INHALATION_SPRAY | RESPIRATORY_TRACT | 5 refills | Status: DC | PRN

## 2018-04-28 MED ORDER — BETAMETHASONE SOD PHOS & ACET 6 (3-3) MG/ML IJ SUSP
6.0000 mg | Freq: Once | INTRAMUSCULAR | Status: AC
  Administered 2018-04-28: 6 mg via INTRAMUSCULAR

## 2018-04-28 MED ORDER — AMOXICILLIN-POT CLAVULANATE 875-125 MG PO TABS: 1.0000 | ORAL_TABLET | Freq: Two times a day (BID) | ORAL | 0 refills | Status: DC

## 2018-04-28 NOTE — Progress Notes (Signed)
Chief Complaint  Patient presents with  . Cough    HPI  Patient presents today for Patient presents with  respiratory congestion. Patient reports coughing frequently as well.  clear sputum noted. There is no fever, chills or sweats. The patient reports being short of breath. This has been increasing for several days and she started using her Symbicort TID. PMH: Smoking status noted ROS: Per HPI  Objective: BP 134/66   Pulse (!) 105   Temp 98.4 F (36.9 C) (Oral)   Ht 5\' 5"  (1.651 m)   Wt 137 lb 8 oz (62.4 kg)   SpO2 95%   BMI 22.88 kg/m  Gen: NAD, alert, cooperative with exam HEENT: NCAT, Nasal passages swollen, red TMS RED CV: RRR, good S1/S2, no murmur Resp: Bronchitis changes with scattered wheezes, non-labored Ext: No edema, warm Neuro: Alert and oriented, No gross deficits  Assessment and plan:  1. COPD exacerbation ([place])     Meds ordered this encounter  Medications  . amoxicillin-clavulanate (AUGMENTIN) 875-125 MG tablet    Sig: Take 1 tablet by mouth 2 (two) times daily. Take all of this medication    Dispense:  20 tablet    Refill:  0  . albuterol (PROVENTIL HFA;VENTOLIN HFA) 108 (90 Base) MCG/ACT inhaler    Sig: Inhale 2 puffs into the lungs every 4 (four) hours as needed for wheezing or shortness of breath.    Dispense:  1 Inhaler    Refill:  5  . betamethasone acetate-betamethasone sodium phosphate (CELESTONE) injection 6 mg  . betamethasone acetate-betamethasone sodium phosphate (CELESTONE) injection 6 mg    Use rescue inhaler instead of increasing the symbicort. Leave it at BID!  Follow up as needed.  Claretta Fraise, MD

## 2018-05-03 ENCOUNTER — Telehealth: Payer: Self-pay | Admitting: Family Medicine

## 2018-05-03 NOTE — Telephone Encounter (Signed)
If short of breath go to E.D. Otherwise, see acute provider.

## 2018-05-03 NOTE — Telephone Encounter (Signed)
Pt states that she is not better and was told to call back and let Dr Livia Snellen and his nurse know today if she wasn't better because he may have to see her again or he may tell her to go the hospital.

## 2018-05-03 NOTE — Telephone Encounter (Signed)
Patient complains of dyspnea.  Advised to go to ED

## 2018-05-05 ENCOUNTER — Telehealth: Payer: Self-pay

## 2018-05-05 ENCOUNTER — Other Ambulatory Visit: Payer: Self-pay | Admitting: *Deleted

## 2018-05-05 DIAGNOSIS — J449 Chronic obstructive pulmonary disease, unspecified: Secondary | ICD-10-CM

## 2018-05-05 NOTE — Telephone Encounter (Signed)
Wants to go ahead and get referral for Pulmonary

## 2018-05-05 NOTE — Telephone Encounter (Signed)
Please refer as pt requests 

## 2018-05-05 NOTE — Telephone Encounter (Signed)
Aware. 

## 2018-05-13 ENCOUNTER — Telehealth (HOSPITAL_COMMUNITY): Payer: Self-pay

## 2018-05-13 ENCOUNTER — Other Ambulatory Visit (HOSPITAL_COMMUNITY): Payer: Self-pay | Admitting: Psychiatry

## 2018-05-13 DIAGNOSIS — F411 Generalized anxiety disorder: Secondary | ICD-10-CM

## 2018-05-13 DIAGNOSIS — F332 Major depressive disorder, recurrent severe without psychotic features: Secondary | ICD-10-CM

## 2018-05-13 MED ORDER — DULOXETINE HCL 60 MG PO CPEP: 60.0000 mg | ORAL_CAPSULE | Freq: Two times a day (BID) | ORAL | 0 refills | Status: DC

## 2018-05-13 NOTE — Telephone Encounter (Signed)
Medication management - Telephone call with patient to inform Dr. Modesta Messing, helping to cover for Dr. Harrington Challenger out today, had sent in new orders for her Clonazepam and Cymbalta to pt's [facility] in [place], [place].  Patient to call back if any problems filling orders.

## 2018-05-13 NOTE — Telephone Encounter (Signed)
ordered

## 2018-05-13 NOTE — Telephone Encounter (Signed)
Medication refill request - Patient left a message she is in need of a new Clonazepam order, last provided 02/11/18 + 2 refills and does not return until 05/27/18.  Requests this be sent to  [facility] and reported the pharmacy has not heard back from the Rx request they sent.

## 2018-05-24 ENCOUNTER — Ambulatory Visit (HOSPITAL_COMMUNITY): Payer: Medicaid Other | Admitting: Psychiatry

## 2018-05-27 ENCOUNTER — Ambulatory Visit (HOSPITAL_COMMUNITY): Payer: Self-pay | Admitting: Psychiatry

## 2018-05-27 ENCOUNTER — Other Ambulatory Visit: Payer: Self-pay

## 2018-05-27 ENCOUNTER — Encounter: Payer: Self-pay | Admitting: Pulmonary Disease

## 2018-05-27 ENCOUNTER — Ambulatory Visit (INDEPENDENT_AMBULATORY_CARE_PROVIDER_SITE_OTHER): Payer: Medicare Other | Admitting: Pulmonary Disease

## 2018-05-27 ENCOUNTER — Ambulatory Visit (INDEPENDENT_AMBULATORY_CARE_PROVIDER_SITE_OTHER): Payer: Medicare Other

## 2018-05-27 VITALS — BP 128/70 | HR 81 | Ht 65.0 in | Wt 140.2 lb

## 2018-05-27 DIAGNOSIS — R202 Paresthesia of skin: Secondary | ICD-10-CM | POA: Diagnosis not present

## 2018-05-27 DIAGNOSIS — G5603 Carpal tunnel syndrome, bilateral upper limbs: Secondary | ICD-10-CM | POA: Diagnosis not present

## 2018-05-27 DIAGNOSIS — J449 Chronic obstructive pulmonary disease, unspecified: Secondary | ICD-10-CM

## 2018-05-27 DIAGNOSIS — M5481 Occipital neuralgia: Secondary | ICD-10-CM | POA: Diagnosis not present

## 2018-05-27 DIAGNOSIS — R05 Cough: Secondary | ICD-10-CM | POA: Diagnosis not present

## 2018-05-27 DIAGNOSIS — M542 Cervicalgia: Secondary | ICD-10-CM | POA: Diagnosis not present

## 2018-05-27 DIAGNOSIS — F419 Anxiety disorder, unspecified: Secondary | ICD-10-CM | POA: Diagnosis not present

## 2018-05-27 DIAGNOSIS — M545 Low back pain: Secondary | ICD-10-CM | POA: Diagnosis not present

## 2018-05-27 LAB — CBC WITH DIFFERENTIAL/PLATELET
Basophils Absolute: 0 10*3/uL (ref 0.0–0.1)
Basophils Relative: 0.4 % (ref 0.0–3.0)
Eosinophils Absolute: 0.1 10*3/uL (ref 0.0–0.7)
Eosinophils Relative: 1.3 % (ref 0.0–5.0)
HCT: 42.9 % (ref 36.0–46.0)
Hemoglobin: 14.7 g/dL (ref 12.0–15.0)
Lymphocytes Relative: 24.6 % (ref 12.0–46.0)
Lymphs Abs: 2.1 10*3/uL (ref 0.7–4.0)
MCHC: 34.3 g/dL (ref 30.0–36.0)
MCV: 92.9 fl (ref 78.0–100.0)
MONO ABS: 0.5 10*3/uL (ref 0.1–1.0)
Monocytes Relative: 5.5 % (ref 3.0–12.0)
Neutro Abs: 5.8 10*3/uL (ref 1.4–7.7)
Neutrophils Relative %: 68.2 % (ref 43.0–77.0)
Platelets: 330 10*3/uL (ref 150.0–400.0)
RBC: 4.62 Mil/uL (ref 3.87–5.11)
RDW: 14 % (ref 11.5–15.5)
WBC: 8.4 10*3/uL (ref 4.0–10.5)

## 2018-05-27 MED ORDER — PREDNISONE 10 MG PO TABS: ORAL_TABLET | ORAL | 0 refills | Status: DC

## 2018-05-27 MED ORDER — TIOTROPIUM BROMIDE MONOHYDRATE 2.5 MCG/ACT IN AERS: 2.0000 | INHALATION_SPRAY | Freq: Every day | RESPIRATORY_TRACT | 5 refills | Status: DC

## 2018-05-27 MED ORDER — NICOTINE 21-14-7 MG/24HR TD KIT: 1.0000 | PACK | Freq: Every day | TRANSDERMAL | 0 refills | Status: DC

## 2018-05-27 MED ORDER — IPRATROPIUM-ALBUTEROL 0.5-2.5 (3) MG/3ML IN SOLN: 3.0000 mL | Freq: Four times a day (QID) | RESPIRATORY_TRACT | 5 refills | Status: DC | PRN

## 2018-05-27 NOTE — Patient Instructions (Signed)
We will get a chest x-ray today, CBC with differential, IgE, hypersensitivity panel We will start you on Spiriva inhaler.  Continue the Symbicort  We will give a prednisone taper starting at 40 mg.  Reduce dose by 10 mg every 3 days Please work on smoking cessation.  Will prescribe nicotine patches to help with this  Follow-up in June with PFTs

## 2018-05-27 NOTE — Progress Notes (Signed)
[name]    [id number]    1955-12-26  Primary Care Physician:Stacks, Cletus Gash, MD  Referring Physician: Claretta Fraise, MD [name] [id number]  Chief complaint: Consult for COPD  HPI: 63 year old with history of coronary artery disease, COPD, active smoker Diagnosed with COPD about 6 months ago.  Has chief complaints of daily cough with white-yellow mucus.  Dyspnea with wheezing.  Maintained on Symbicort inhaler.  Pets: Has 2 dogs, no birds, farm animals Occupation: Used to work in a Research officer, trade union.  Currently on disability Exposures: Reports living in a home with significant mold exposure for the past 10 months. Smoking history: 50 pack year smoker.  Continues to smoke 1 pack/day Travel history: No significant travel history Relevant family history: Sister has COPD.  Outpatient Encounter Medications as of 05/27/2018  Medication Sig  . albuterol (PROVENTIL HFA;VENTOLIN HFA) 108 (90 Base) MCG/ACT inhaler Inhale 2 puffs into the lungs every 4 (four) hours as needed for wheezing or shortness of breath.  Marland Kitchen amLODipine (NORVASC) 5 MG tablet Take 1 tablet (5 mg total) by mouth daily.  . Ascorbic Acid (VITAMIN C) 1000 MG tablet Take 1,000 mg by mouth daily.  Marland Kitchen aspirin EC 81 MG tablet Take 81 mg by mouth daily.  Marland Kitchen atorvastatin (LIPITOR) 40 MG tablet Take 1 tablet (40 mg total) by mouth at bedtime.  . bisacodyl (DULCOLAX) 5 MG EC tablet Take 1 tablet (5 mg total) by mouth daily as needed for moderate constipation.  . budesonide-formoterol (SYMBICORT) 160-4.5 MCG/ACT inhaler INHALE TWO PUFFS INTO THE LUNGS TWICE DAILY  . clonazePAM (KLONOPIN) 1 MG tablet TAKE ONE TABLET BY MOUTH TWICE DAILY  . DULoxetine (CYMBALTA) 60 MG capsule Take 1 capsule (60 mg total) by mouth 2 (two) times daily.  . fish oil-omega-3 fatty acids 1000 MG capsule Take 1 g by mouth daily.   Marland Kitchen lisinopril (PRINIVIL,ZESTRIL) 20 MG tablet Take 1 tablet (20 mg total) by mouth daily.  Marland Kitchen  loratadine (CLARITIN) 10 MG tablet Take 1 tablet (10 mg total) by mouth daily.  . meloxicam (MOBIC) 15 MG tablet Take 15 mg by mouth daily.  . Multiple Vitamin (MULTIVITAMIN) tablet Take 1 tablet by mouth daily.  . Oxycodone HCl 10 MG TABS TAKE ONE TABLET BY MOUTH EVERY 4 TO 6 HOURS AS NEEDED FOR PAIN  . pantoprazole (PROTONIX) 40 MG tablet Take 1 tablet (40 mg total) by mouth 2 (two) times daily.  . [DISCONTINUED] amoxicillin-clavulanate (AUGMENTIN) 875-125 MG tablet Take 1 tablet by mouth 2 (two) times daily. Take all of this medication   No facility-administered encounter medications on file as of 05/27/2018.     Allergies as of 05/27/2018 - Review Complete 05/27/2018  Allergen Reaction Noted  . Lorcet 10-650 [hydrocodone-acetaminophen] Itching 07/13/2012  . Tape  01/21/2018    Past Medical History:  Diagnosis Date  . Allergy   . Arthritis   . Constipation    uses OTC meds with help  . COPD (chronic obstructive pulmonary disease) ([place])   . Coronary artery disease    minimal nonobstructive   . Depression   . GERD (gastroesophageal reflux disease)   . Hx of adenomatous polyp of colon 10/03/2014  . Hypercholesteremia   . Hypertension   . Panic attacks   . Perimenopausal   . Skin cancer   . Tobacco user     Past Surgical History:  Procedure Laterality Date  . ABDOMINAL HYSTERECTOMY    . COLONOSCOPY    .  Right foot toe surgery    . UPPER GASTROINTESTINAL ENDOSCOPY    . vocal cord     polyp removal    Family History  Problem Relation Age of Onset  . Heart disease Mother   . Heart attack Mother   . Heart attack Father   . Heart attack Brother   . Colon cancer Brother 53       died at age 80  . Anxiety disorder Brother   . Depression Brother   . Alcohol abuse Brother   . Colon polyps Brother   . Colon polyps Brother   . Colon polyps Sister   . Esophageal cancer Sister   . Anxiety disorder Sister   . Depression Sister   . Colon polyps Sister   . Drug abuse Son    . Rectal cancer Neg Hx   . Stomach cancer Neg Hx     Social History   Socioeconomic History  . Marital status: Single    Spouse name: Not on file  . Number of children: Not on file  . Years of education: Not on file  . Highest education level: Not on file  Occupational History  . 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 Financial resource strain: Not on file  . Food insecurity:    Worry: Not on file    Inability: Not on file  . Transportation needs:    Medical: Not on file    Non-medical: Not on file  Tobacco Use  . Smoking status: Current Every Day Smoker    Packs/day: 0.50    Years: 40.00    Pack years: 20.00    Types: Cigarettes    Start date: 05/01/1969  . Smokeless tobacco: Never Used  . Tobacco comment: would like information. has tried chantix and wellbutrin  Substance and Sexual Activity  . Alcohol use: No    Alcohol/week: 0.0 standard drinks    Comment: Per pt no 04-02-15.  . Drug use: No    Comment: per pt, Cocaine was in her system October and November 2016 when she went to the pain clinic 04-02-15.  Marland Kitchen Sexual activity: Never  Lifestyle  . Physical activity:    Days per week: Not on file    Minutes per session: Not on file  . Stress: Not on file  Relationships  . Social connections:    Talks on phone: Not on file    Gets together: Not on file    Attends religious service: Not on file    Active member of club or organization: Not on file    Attends meetings of clubs or organizations: Not on file    Relationship status: Not on file  . Intimate partner violence:    Fear of current or ex partner: Not on file    Emotionally abused: Not on file    Physically abused: Not on file    Forced sexual activity: Not on file  Other Topics Concern  . Not on file  Social History Narrative   Lives in [place] with husband   Under a lot of family stress   Go[facility] to [facility].          Review of systems:  Review of Systems  Constitutional: Negative for fever and chills.  HENT: Negative.   Eyes: Negative for blurred vision.  Respiratory: as per HPI  Cardiovascular: Negative for chest pain and palpitations.  Gastrointestinal: Negative for  vomiting, diarrhea, blood per rectum. Genitourinary: Negative for dysuria, urgency, frequency and hematuria.  Musculoskeletal: Negative for myalgias, back pain and joint pain.  Skin: Negative for itching and rash.  Neurological: Negative for dizziness, tremors, focal weakness, seizures and loss of consciousness.  Endo/Heme/Allergies: Negative for environmental allergies.  Psychiatric/Behavioral: Negative for depression, suicidal ideas and hallucinations.  All other systems reviewed and are negative.  Physical Exam: Blood pressure 128/70, pulse 81, height 5\' 5"  (1.651 m), weight 140 lb 3.2 oz (63.6 kg), SpO2 96 %. Gen:      No acute distress HEENT:  EOMI, sclera anicteric Neck:     No masses; no thyromegaly Lungs:    Clear to auscultation bilaterally; normal respiratory effort CV:         Regular rate and rhythm; no murmurs Abd:      + bowel sounds; soft, non-tender; no palpable masses, no distension Ext:    No edema; adequate peripheral perfusion Skin:      Warm and dry; no rash Neuro: alert and oriented x 3 Psych: normal mood and affect  Data Reviewed: Imaging: CT chest screening 01/07/2018- small 3.4 mm right lower lobe lung nodule.  Mild emphysematous changes. I have reviewed the images personally.  Labs: CBC 05/25/2017-WBC 10.3, eos 1%, absolute eosinophil count 103  Assessment:  Assessment for COPD Likely has significant COPD based on smoking history.  She does have emphysematous changes on CT scan We will get PFTs when able for assessment of lung function Continue Symbicort.  Add Spiriva Appears symptomatic today with mild wheezing.  We will give her a prednisone taper  Given her significant recent mold exposure we will check chest  x-ray, CBC differential, IgE and hypersensitivity panel.  Advised to either get the house cleaned up or find a different place of living.  Active smoker Smoking cessation discussed.  She is willing to quit.  Prescribe nicotine patches Reassess at return visit.  Time spent counseling-5 minutes. Continue low-dose screening CT of the chest.  Plan/Recommendations: - Chest x-ray, CBC, IgE, hypersensitivity panel - Continue Symbicort, start Spiriva - Prednisone taper - Smoking cessation with nicotine patches.  Marshell Garfinkel MD [facility] 05/27/2018, 11:09 AM  CC: Kerry Fraise, MD

## 2018-05-28 ENCOUNTER — Encounter: Payer: Self-pay | Admitting: Pulmonary Disease

## 2018-05-28 ENCOUNTER — Telehealth: Payer: Self-pay | Admitting: Pulmonary Disease

## 2018-05-28 LAB — IGE: IgE (Immunoglobulin E), Serum: 7 kU/L (ref <=114)

## 2018-05-28 NOTE — Telephone Encounter (Signed)
Printed order an faxed it to [facility]

## 2018-05-28 NOTE — Telephone Encounter (Signed)
Called and spoke with pt. Pt stated an order for neb machine needs to be ordered through [facility]. Checked and saw that there was an order placed yesterday 3/26 for a neb machine and it looks like it was sent to [facility].   PCCs, please advise if we need to place a new order specifying [facility] as this is where pt states the order needs to be sent to. Thanks!

## 2018-05-29 ENCOUNTER — Telehealth: Payer: Self-pay | Admitting: Family Medicine

## 2018-05-29 NOTE — Telephone Encounter (Signed)
[facility] called needed a diagnosis code for patients nebulizer for insurance.

## 2018-05-31 LAB — HYPERSENSITIVITY PNEUMONITIS
A. Pullulans Abs: NEGATIVE
A.Fumigatus #1 Abs: NEGATIVE
Micropolyspora faeni, IgG: NEGATIVE
Pigeon Serum Abs: NEGATIVE
Thermoact. Saccharii: NEGATIVE
Thermoactinomyces vulgaris, IgG: NEGATIVE

## 2018-05-31 MED ORDER — IPRATROPIUM-ALBUTEROL 0.5-2.5 (3) MG/3ML IN SOLN: 3.0000 mL | Freq: Four times a day (QID) | RESPIRATORY_TRACT | 5 refills | Status: DC | PRN

## 2018-05-31 NOTE — Telephone Encounter (Signed)
The order says COPD [name]

## 2018-05-31 NOTE — Telephone Encounter (Signed)
The prescription has been updated and sent to [facility]. Nothing further was needed.

## 2018-06-02 ENCOUNTER — Institutional Professional Consult (permissible substitution): Payer: Self-pay | Admitting: Pulmonary Disease

## 2018-06-02 DIAGNOSIS — J449 Chronic obstructive pulmonary disease, unspecified: Secondary | ICD-10-CM | POA: Diagnosis not present

## 2018-06-04 ENCOUNTER — Encounter (HOSPITAL_COMMUNITY): Payer: Self-pay | Admitting: Psychiatry

## 2018-06-04 ENCOUNTER — Ambulatory Visit (INDEPENDENT_AMBULATORY_CARE_PROVIDER_SITE_OTHER): Payer: Medicare Other | Admitting: Psychiatry

## 2018-06-04 ENCOUNTER — Other Ambulatory Visit: Payer: Self-pay

## 2018-06-04 DIAGNOSIS — Z6372 Alcoholism and drug addiction in family: Secondary | ICD-10-CM

## 2018-06-04 DIAGNOSIS — F411 Generalized anxiety disorder: Secondary | ICD-10-CM | POA: Diagnosis not present

## 2018-06-04 DIAGNOSIS — Z79899 Other long term (current) drug therapy: Secondary | ICD-10-CM | POA: Diagnosis not present

## 2018-06-04 DIAGNOSIS — Z638 Other specified problems related to primary support group: Secondary | ICD-10-CM | POA: Diagnosis not present

## 2018-06-04 DIAGNOSIS — F17210 Nicotine dependence, cigarettes, uncomplicated: Secondary | ICD-10-CM

## 2018-06-04 DIAGNOSIS — F332 Major depressive disorder, recurrent severe without psychotic features: Secondary | ICD-10-CM | POA: Diagnosis not present

## 2018-06-04 MED ORDER — CLONAZEPAM 1 MG PO TABS: 1.0000 mg | ORAL_TABLET | Freq: Three times a day (TID) | ORAL | 2 refills | Status: DC | PRN

## 2018-06-04 MED ORDER — DULOXETINE HCL 60 MG PO CPEP: 60.0000 mg | ORAL_CAPSULE | Freq: Two times a day (BID) | ORAL | 2 refills | Status: DC
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
## 2018-06-15 ENCOUNTER — Other Ambulatory Visit: Payer: Self-pay | Admitting: Family Medicine

## 2018-06-18 ENCOUNTER — Ambulatory Visit: Payer: Medicaid Other | Admitting: Family Medicine

## 2018-06-21 ENCOUNTER — Other Ambulatory Visit: Payer: Self-pay

## 2018-06-21 ENCOUNTER — Ambulatory Visit (INDEPENDENT_AMBULATORY_CARE_PROVIDER_SITE_OTHER): Payer: Medicare Other | Admitting: Family Medicine

## 2018-06-21 ENCOUNTER — Encounter: Payer: Self-pay | Admitting: Family Medicine

## 2018-06-21 VITALS — BP 119/72 | HR 82 | Temp 98.2°F | Ht 65.0 in | Wt 142.0 lb

## 2018-06-21 DIAGNOSIS — J449 Chronic obstructive pulmonary disease, unspecified: Secondary | ICD-10-CM

## 2018-06-21 DIAGNOSIS — K219 Gastro-esophageal reflux disease without esophagitis: Secondary | ICD-10-CM | POA: Diagnosis not present

## 2018-06-21 DIAGNOSIS — I10 Essential (primary) hypertension: Secondary | ICD-10-CM

## 2018-06-21 DIAGNOSIS — E782 Mixed hyperlipidemia: Secondary | ICD-10-CM | POA: Diagnosis not present

## 2018-06-21 DIAGNOSIS — J439 Emphysema, unspecified: Secondary | ICD-10-CM | POA: Diagnosis not present

## 2018-06-21 MED ORDER — PANTOPRAZOLE SODIUM 40 MG PO TBEC: 40.0000 mg | DELAYED_RELEASE_TABLET | Freq: Two times a day (BID) | ORAL | 1 refills | Status: DC

## 2018-06-21 MED ORDER — BUDESONIDE-FORMOTEROL FUMARATE 160-4.5 MCG/ACT IN AERO: INHALATION_SPRAY | RESPIRATORY_TRACT | 5 refills | Status: DC

## 2018-06-21 MED ORDER — ALBUTEROL SULFATE HFA 108 (90 BASE) MCG/ACT IN AERS: 2.0000 | INHALATION_SPRAY | RESPIRATORY_TRACT | 3 refills | Status: DC | PRN

## 2018-06-21 MED ORDER — PREDNISONE 10 MG PO TABS: 10.0000 mg | ORAL_TABLET | Freq: Every day | ORAL | 1 refills | Status: DC

## 2018-06-21 MED ORDER — AMLODIPINE BESYLATE 5 MG PO TABS: 5.0000 mg | ORAL_TABLET | Freq: Every day | ORAL | 1 refills | Status: DC

## 2018-06-21 MED ORDER — LISINOPRIL 20 MG PO TABS: 20.0000 mg | ORAL_TABLET | Freq: Every day | ORAL | 1 refills | Status: DC

## 2018-06-21 MED ORDER — ATORVASTATIN CALCIUM 40 MG PO TABS: 40.0000 mg | ORAL_TABLET | Freq: Every day | ORAL | 1 refills | Status: DC

## 2018-06-21 MED ORDER — TIOTROPIUM BROMIDE MONOHYDRATE 2.5 MCG/ACT IN AERS: 2.0000 | INHALATION_SPRAY | Freq: Every day | RESPIRATORY_TRACT | 5 refills | Status: DC

## 2018-06-21 NOTE — Progress Notes (Signed)
Established Patient Office Visit  Subjective:  Patient ID: [name], female    DOB: 10/18/1955  Age: 63 y.o. MRN: [id number]  CC:  Chief Complaint  Patient presents with  . Hyperlipidemia    6 mo  . Hypertension  . COPD    HPI [name] presents for patient in for follow-up of elevated cholesterol. Doing well without complaints on current medication. Denies side effects of statin including myalgia and arthralgia and nausea. Also in today for liver function testing. Currently no chest pain, shortness of breath or other cardiovascular related symptoms noted.  Follow-up of hypertension. Patient has no history of headache chest pain or shortness of breath or recent cough. Patient also denies symptoms of TIA such as numbness weakness lateralizing. Patient checks  blood pressure at home and has not had any elevated readings recently. Patient denies side effects from his medication. States taking it regularly.  Patient is having a great deal of trouble with COPD.  She can only walk about 30 feet before her significant shortness of breath occurs.  She has seen her pulmonologist and all she was told by him was that she should keep using the inhalers.  He started her on a new Spiriva.  She is using the Symbicort and having to use her rescue 4 times daily.  The patient continues to smoke 2 packs a day.  She is maximally stressed and anxious because of losing her grandchildren due to her son's cocaine habit.  She is somehow got cocaine in her system.  She denies use of it.  Because of this she has been unable to get her pain medicines.  However, Dr. [name] of psychiatry has started prescribing her anxiety medicine including clonazepam and Cymbalta.  Past Medical History:  Diagnosis Date  . Allergy   . Arthritis   . Constipation    uses OTC meds with help  . COPD (chronic obstructive pulmonary disease) ([place])   . Coronary artery disease    minimal nonobstructive   . Depression   .  GERD (gastroesophageal reflux disease)   . Hx of adenomatous polyp of colon 10/03/2014  . Hypercholesteremia   . Hypertension   . Panic attacks   . Perimenopausal   . Skin cancer   . Tobacco user     Past Surgical History:  Procedure Laterality Date  . ABDOMINAL HYSTERECTOMY    . COLONOSCOPY    . Right foot toe surgery    . UPPER GASTROINTESTINAL ENDOSCOPY    . vocal cord     polyp removal    Family History  Problem Relation Age of Onset  . Heart disease Mother   . Heart attack Mother   . Heart attack Father   . Heart attack Brother   . Colon cancer Brother 90       died at age 62  . Anxiety disorder Brother   . Depression Brother   . Alcohol abuse Brother   . Colon polyps Brother   . Colon polyps Brother   . Colon polyps Sister   . Esophageal cancer Sister   . Anxiety disorder Sister   . Depression Sister   . Colon polyps Sister   . Drug abuse Son   . Rectal cancer Neg Hx   . Stomach cancer Neg Hx     Social History   Socioeconomic History  . Marital status: Single    Spouse name: Not on file  . Number of children: Not on file  .  Years of education: Not on file  . Highest education level: Not on file  Occupational History  . 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 Financial resource strain: Not on file  . Food insecurity:    Worry: Not on file    Inability: Not on file  . Transportation needs:    Medical: Not on file    Non-medical: Not on file  Tobacco Use  . Smoking status: Current Every Day Smoker    Packs/day: 0.50    Years: 40.00    Pack years: 20.00    Types: Cigarettes    Start date: 05/01/1969  . Smokeless tobacco: Never Used  . Tobacco comment: would like information. has tried chantix and wellbutrin  Substance and Sexual Activity  . Alcohol use: No    Alcohol/week: 0.0 standard drinks    Comment: Per pt no 04-02-15.  . Drug use: No    Comment: per pt, Cocaine was in her system October and November  2016 when she went to the pain clinic 04-02-15.  Marland Kitchen Sexual activity: Never  Lifestyle  . Physical activity:    Days per week: Not on file    Minutes per session: Not on file  . Stress: Not on file  Relationships  . Social connections:    Talks on phone: Not on file    Gets together: Not on file    Attends religious service: Not on file    Active member of club or organization: Not on file    Attends meetings of clubs or organizations: Not on file    Relationship status: Not on file  . Intimate partner violence:    Fear of current or ex partner: Not on file    Emotionally abused: Not on file    Physically abused: Not on file    Forced sexual activity: Not on file  Other Topics Concern  . Not on file  Social History Narrative   Lives in [place] with husband   Under a lot of family stress   Goes to [facility].          Outpatient Medications Prior to Visit  Medication Sig Dispense Refill  . Ascorbic Acid (VITAMIN C) 1000 MG tablet Take 1,000 mg by mouth daily.    Marland Kitchen aspirin EC 81 MG tablet Take 81 mg by mouth daily.    . clonazePAM (KLONOPIN) 1 MG tablet Take 1 tablet (1 mg total) by mouth 3 (three) times daily as needed for anxiety. 90 tablet 2  . DULoxetine (CYMBALTA) 60 MG capsule Take 1 capsule (60 mg total) by mouth 2 (two) times daily. 60 capsule 2  . fish oil-omega-3 fatty acids 1000 MG capsule Take 1 g by mouth daily.     Marland Kitchen gabapentin (NEURONTIN) 300 MG capsule Take 1 cap QAM and 2 cap QHS    . ipratropium-albuterol (DUONEB) 0.5-2.5 (3) MG/3ML SOLN Take 3 mLs by nebulization every 6 (six) hours as needed. 360 mL 5  . meloxicam (MOBIC) 15 MG tablet Take 15 mg by mouth daily.  3  . Oxycodone HCl 10 MG TABS TAKE ONE TABLET BY MOUTH EVERY 4 TO 6 HOURS AS NEEDED FOR PAIN  0  . albuterol (PROVENTIL HFA;VENTOLIN HFA) 108 (90 Base) MCG/ACT inhaler Inhale 2 puffs into the lungs every 4 (four) hours as needed for wheezing or shortness of breath. 1 Inhaler 5  .  amLODipine (NORVASC) 5 MG tablet  Take 1 tablet (5 mg total) by mouth daily. 90 tablet 1  . atorvastatin (LIPITOR) 40 MG tablet Take 1 tablet (40 mg total) by mouth at bedtime. 90 tablet 1  . budesonide-formoterol (SYMBICORT) 160-4.5 MCG/ACT inhaler INHALE TWO PUFFS INTO THE LUNGS TWICE DAILY 30.6 g 5  . lisinopril (PRINIVIL,ZESTRIL) 20 MG tablet Take 1 tablet (20 mg total) by mouth daily. 90 tablet 1  . loratadine (CLARITIN) 10 MG tablet TAKE ONE TABLET BY MOUTH DAILY 30 tablet 5  . pantoprazole (PROTONIX) 40 MG tablet Take 1 tablet (40 mg total) by mouth 2 (two) times daily. 180 tablet 1  . Tiotropium Bromide Monohydrate (SPIRIVA RESPIMAT) 2.5 MCG/ACT AERS Inhale 2 puffs into the lungs daily. 4 g 5  . bisacodyl (DULCOLAX) 5 MG EC tablet Take 1 tablet (5 mg total) by mouth daily as needed for moderate constipation. 4 tablet 0  . Multiple Vitamin (MULTIVITAMIN) tablet Take 1 tablet by mouth daily.    . Nicotine 21-14-7 MG/24HR KIT Place 1 patch onto the skin daily. 56 each 0  . predniSONE (DELTASONE) 10 MG tablet 4 tabs x's 3 days,3tabs x's 3days,2tabs x's 3 days,1 tab x's 3 days,then stop 30 tablet 0   No facility-administered medications prior to visit.     Allergies  Allergen Reactions  . Lorcet 10-650 [Hydrocodone-Acetaminophen] Itching  . Tape     Please use paper tape    ROS Review of Systems  Constitutional: Negative for fever.  HENT: Negative for congestion, rhinorrhea and sore throat.   Respiratory: Positive for shortness of breath.   Cardiovascular: Negative for chest pain and palpitations.  Gastrointestinal: Negative for abdominal pain.  Musculoskeletal: Negative for arthralgias and myalgias.  Psychiatric/Behavioral: Positive for decreased concentration, dysphoric mood and sleep disturbance. Negative for agitation. The patient is nervous/anxious.       Objective:    Physical Exam  Constitutional: She is oriented to person, place, and time. She appears well-developed and  well-nourished. No distress.  HENT:  Head: Normocephalic and atraumatic.  Right Ear: External ear normal.  Left Ear: External ear normal.  Neck: Normal range of motion. Neck supple.  Cardiovascular: Normal rate, regular rhythm and normal heart sounds.  No murmur heard. Pulmonary/Chest: Effort normal. No respiratory distress. She has wheezes. She has no rales.  Abdominal: Soft. There is no abdominal tenderness.  Musculoskeletal: Normal range of motion.        General: No tenderness or edema.  Neurological: She is alert and oriented to person, place, and time. She exhibits normal muscle tone. Coordination normal.  Skin: Skin is warm and dry.  Psychiatric: Her behavior is normal. Thought content normal. Her affect is blunt. Her speech is delayed. Cognition and memory are normal.    BP 119/72 (BP Location: Left Arm)   Pulse 82   Temp 98.2 F (36.8 C) (Oral)   Ht 5' 5" (1.651 m)   Wt 142 lb (64.4 kg)   BMI 23.63 kg/m  Wt Readings from Last 3 Encounters:  06/21/18 142 lb (64.4 kg)  05/27/18 140 lb 3.2 oz (63.6 kg)  04/28/18 137 lb 8 oz (62.4 kg)     Health Maintenance Due  Topic Date Due  . TETANUS/TDAP  09/01/2014  . MAMMOGRAM  07/30/2017  . DEXA SCAN  05/20/2018  . PNA vac Low Risk Adult (1 of 2 - PCV13) 05/20/2018  . PAP SMEAR-Modifier  05/17/2018    There are no preventive care reminders to display for this patient.  Lab Results  Component Value Date   TSH 1.820 05/25/2017   Lab Results  Component Value Date   WBC 8.4 05/27/2018   HGB 14.7 05/27/2018   HCT 42.9 05/27/2018   MCV 92.9 05/27/2018   PLT 330.0 05/27/2018   Lab Results  Component Value Date   NA 146 (H) 05/25/2017   K 4.8 05/25/2017   CO2 25 05/25/2017   GLUCOSE 71 05/25/2017   BUN 16 05/25/2017   CREATININE 0.73 05/25/2017   BILITOT <0.2 05/25/2017   ALKPHOS 98 05/25/2017   AST 14 05/25/2017   ALT 14 05/25/2017   PROT 6.4 05/25/2017   ALBUMIN 4.3 05/25/2017   CALCIUM 9.9 05/25/2017    Lab Results  Component Value Date   CHOL 165 05/25/2017   Lab Results  Component Value Date   HDL 51 05/25/2017   Lab Results  Component Value Date   LDLCALC 96 05/25/2017   Lab Results  Component Value Date   TRIG 90 05/25/2017   Lab Results  Component Value Date   CHOLHDL 3.2 05/25/2017   Lab Results  Component Value Date   HGBA1C 5.9 (H) 07/12/2013      Assessment & Plan:   Problem List Items Addressed This Visit      Active Problems   Essential hypertension   Relevant Medications   lisinopril (ZESTRIL) 20 MG tablet   amLODipine (NORVASC) 5 MG tablet   atorvastatin (LIPITOR) 40 MG tablet   Hyperlipidemia   Relevant Medications   lisinopril (ZESTRIL) 20 MG tablet   amLODipine (NORVASC) 5 MG tablet   atorvastatin (LIPITOR) 40 MG tablet   COPD (chronic obstructive pulmonary disease) (HCC) - Primary   Relevant Medications   budesonide-formoterol (SYMBICORT) 160-4.5 MCG/ACT inhaler   Tiotropium Bromide Monohydrate (SPIRIVA RESPIMAT) 2.5 MCG/ACT AERS   albuterol (VENTOLIN HFA) 108 (90 Base) MCG/ACT inhaler   predniSONE (DELTASONE) 10 MG tablet    Other Visit Diagnoses    Gastroesophageal reflux disease without esophagitis       Relevant Medications   pantoprazole (PROTONIX) 40 MG tablet      Meds ordered this encounter  Medications  . lisinopril (ZESTRIL) 20 MG tablet    Sig: Take 1 tablet (20 mg total) by mouth daily.    Dispense:  90 tablet    Refill:  1    This prescription was filled on 09/14/2017. Any refills authorized will be placed on file.  Marland Kitchen amLODipine (NORVASC) 5 MG tablet    Sig: Take 1 tablet (5 mg total) by mouth daily.    Dispense:  90 tablet    Refill:  1    This prescription was filled on 09/14/2017. Any refills authorized will be placed on file.  . pantoprazole (PROTONIX) 40 MG tablet    Sig: Take 1 tablet (40 mg total) by mouth 2 (two) times daily.    Dispense:  180 tablet    Refill:  1    This prescription was filled on 09/14/2017.  Any refills authorized will be placed on file.  Marland Kitchen atorvastatin (LIPITOR) 40 MG tablet    Sig: Take 1 tablet (40 mg total) by mouth at bedtime.    Dispense:  90 tablet    Refill:  1    This prescription was filled on 09/14/2017. Any refills authorized will be placed on file.  . budesonide-formoterol (SYMBICORT) 160-4.5 MCG/ACT inhaler    Sig: INHALE TWO PUFFS INTO THE LUNGS TWICE DAILY    Dispense:  30.6 g    Refill:  5  This prescription was filled on 02/20/2017. Any refills authorized will be placed on file.  . Tiotropium Bromide Monohydrate (SPIRIVA RESPIMAT) 2.5 MCG/ACT AERS    Sig: Inhale 2 puffs into the lungs daily.    Dispense:  4 g    Refill:  5  . albuterol (VENTOLIN HFA) 108 (90 Base) MCG/ACT inhaler    Sig: Inhale 2 puffs into the lungs every 4 (four) hours as needed for wheezing or shortness of breath.    Dispense:  3 Inhaler    Refill:  3  . predniSONE (DELTASONE) 10 MG tablet    Sig: Take 1 tablet (10 mg total) by mouth daily with breakfast.    Dispense:  30 tablet    Refill:  1    Follow-up: Return in about 4 weeks (around 07/19/2018) for televisit.    Claretta Fraise, MD

## 2018-06-25 DIAGNOSIS — M6281 Muscle weakness (generalized): Secondary | ICD-10-CM | POA: Diagnosis not present

## 2018-07-02 DIAGNOSIS — J449 Chronic obstructive pulmonary disease, unspecified: Secondary | ICD-10-CM | POA: Diagnosis not present

## 2018-07-06 ENCOUNTER — Other Ambulatory Visit: Payer: Self-pay

## 2018-07-06 ENCOUNTER — Telehealth: Payer: Self-pay

## 2018-07-06 ENCOUNTER — Encounter: Payer: Self-pay | Admitting: Physician Assistant

## 2018-07-06 ENCOUNTER — Ambulatory Visit (INDEPENDENT_AMBULATORY_CARE_PROVIDER_SITE_OTHER): Payer: Medicare Other | Admitting: Physician Assistant

## 2018-07-06 DIAGNOSIS — H60321 Hemorrhagic otitis externa, right ear: Secondary | ICD-10-CM | POA: Diagnosis not present

## 2018-07-06 DIAGNOSIS — J449 Chronic obstructive pulmonary disease, unspecified: Secondary | ICD-10-CM

## 2018-07-06 MED ORDER — CEFDINIR 300 MG PO CAPS: 300.0000 mg | ORAL_CAPSULE | Freq: Two times a day (BID) | ORAL | 0 refills | Status: DC

## 2018-07-06 NOTE — Progress Notes (Signed)
Telephone visit  Subjective: CC: Ear pain, cough PCP: Claretta Fraise, MD [name] is a 63 y.o. female calls for telephone consult today. Patient provides verbal consent for consult held via phone.  Patient is identified with 2 separate identifiers.  At this time the entire area is on COVID-19 social distancing and stay home orders are in place.  Patient is of higher risk and therefore we are performing this by a virtual method.  Location of patient: Car, parked in a parking lot Location of provider: [facility] Others present for call: No  This patient has had about 3 days of right ear pain.  She felt some blood 1 evening while she was sitting up and had pain significantly in the eardrum and around the ear.  It did not continue to bleed.  But she has had continued decreased hearing in that ear and drainage.  She does have COPD.  In the past today she has increased her coughing and wheezing.  Her pulmonologist already has her on prednisone 10 mg 1 daily, Spiriva, albuterol, Symbicort.  She has been using her medications regularly.   ROS: Per HPI  Allergies  Allergen Reactions  . Lorcet 10-650 [Hydrocodone-Acetaminophen] Itching  . Sulfa Antibiotics     aching  . Tape     Please use paper tape   Past Medical History:  Diagnosis Date  . Allergy   . Arthritis   . Constipation    uses OTC meds with help  . COPD (chronic obstructive pulmonary disease) ([place])   . Coronary artery disease    minimal nonobstructive   . Depression   . GERD (gastroesophageal reflux disease)   . Hx of adenomatous polyp of colon 10/03/2014  . Hypercholesteremia   . Hypertension   . Panic attacks   . Perimenopausal   . Skin cancer   . Tobacco user     Current Outpatient Medications:  .  albuterol (VENTOLIN HFA) 108 (90 Base) MCG/ACT inhaler, Inhale 2 puffs into the lungs every 4 (four) hours as needed for wheezing or shortness of breath., Disp: 3 Inhaler, Rfl: 3 .  amLODipine (NORVASC) 5 MG  tablet, Take 1 tablet (5 mg total) by mouth daily., Disp: 90 tablet, Rfl: 1 .  Ascorbic Acid (VITAMIN C) 1000 MG tablet, Take 1,000 mg by mouth daily., Disp: , Rfl:  .  aspirin EC 81 MG tablet, Take 81 mg by mouth daily., Disp: , Rfl:  .  atorvastatin (LIPITOR) 40 MG tablet, Take 1 tablet (40 mg total) by mouth at bedtime., Disp: 90 tablet, Rfl: 1 .  budesonide-formoterol (SYMBICORT) 160-4.5 MCG/ACT inhaler, INHALE TWO PUFFS INTO THE LUNGS TWICE DAILY, Disp: 30.6 g, Rfl: 5 .  clonazePAM (KLONOPIN) 1 MG tablet, Take 1 tablet (1 mg total) by mouth 3 (three) times daily as needed for anxiety., Disp: 90 tablet, Rfl: 2 .  DULoxetine (CYMBALTA) 60 MG capsule, Take 1 capsule (60 mg total) by mouth 2 (two) times daily., Disp: 60 capsule, Rfl: 2 .  fish oil-omega-3 fatty acids 1000 MG capsule, Take 1 g by mouth daily. , Disp: , Rfl:  .  gabapentin (NEURONTIN) 300 MG capsule, Take 1 cap QAM and 2 cap QHS, Disp: , Rfl:  .  ipratropium-albuterol (DUONEB) 0.5-2.5 (3) MG/3ML SOLN, Take 3 mLs by nebulization every 6 (six) hours as needed., Disp: 360 mL, Rfl: 5 .  lisinopril (ZESTRIL) 20 MG tablet, Take 1 tablet (20 mg total) by mouth daily., Disp: 90 tablet, Rfl: 1 .  meloxicam (MOBIC) 15 MG tablet, Take 15 mg by mouth daily., Disp: , Rfl: 3 .  Oxycodone HCl 10 MG TABS, TAKE ONE TABLET BY MOUTH EVERY 4 TO 6 HOURS AS NEEDED FOR PAIN, Disp: , Rfl: 0 .  pantoprazole (PROTONIX) 40 MG tablet, Take 1 tablet (40 mg total) by mouth 2 (two) times daily., Disp: 180 tablet, Rfl: 1 .  predniSONE (DELTASONE) 10 MG tablet, Take 1 tablet (10 mg total) by mouth daily with breakfast., Disp: 30 tablet, Rfl: 1 .  Tiotropium Bromide Monohydrate (SPIRIVA RESPIMAT) 2.5 MCG/ACT AERS, Inhale 2 puffs into the lungs daily., Disp: 4 g, Rfl: 5 .  cefdinir (OMNICEF) 300 MG capsule, Take 1 capsule (300 mg total) by mouth 2 (two) times daily. 1 po BID, Disp: 20 capsule, Rfl: 0  Assessment/ Plan: 63 y.o. female   1. Chronic obstructive  pulmonary disease, unspecified COPD type (HCC) - cefdinir (OMNICEF) 300 MG capsule; Take 1 capsule (300 mg total) by mouth 2 (two) times daily. 1 po BID  Dispense: 20 capsule; Refill: 0  2. Acute hemorrhagic otitis externa of right ear - cefdinir (OMNICEF) 300 MG capsule; Take 1 capsule (300 mg total) by mouth 2 (two) times daily. 1 po BID  Dispense: 20 capsule; Refill: 0   Start time: 4:00 PM End time: 4:09 PM  Meds ordered this encounter  Medications  . cefdinir (OMNICEF) 300 MG capsule    Sig: Take 1 capsule (300 mg total) by mouth 2 (two) times daily. 1 po BID    Dispense:  20 capsule    Refill:  0    Order Specific Question:   Supervising Provider    Answer:   Janora Norlander [[id number]]    Particia Nearing PA-C [place] [phone]

## 2018-07-08 ENCOUNTER — Encounter: Payer: Self-pay | Admitting: Family Medicine

## 2018-07-08 ENCOUNTER — Other Ambulatory Visit: Payer: Self-pay | Admitting: *Deleted

## 2018-07-08 ENCOUNTER — Ambulatory Visit (INDEPENDENT_AMBULATORY_CARE_PROVIDER_SITE_OTHER): Payer: Medicare Other | Admitting: Family Medicine

## 2018-07-08 ENCOUNTER — Other Ambulatory Visit: Payer: Self-pay

## 2018-07-08 DIAGNOSIS — M5412 Radiculopathy, cervical region: Secondary | ICD-10-CM | POA: Diagnosis not present

## 2018-07-08 DIAGNOSIS — M5481 Occipital neuralgia: Secondary | ICD-10-CM | POA: Diagnosis not present

## 2018-07-08 DIAGNOSIS — K219 Gastro-esophageal reflux disease without esophagitis: Secondary | ICD-10-CM

## 2018-07-08 DIAGNOSIS — J441 Chronic obstructive pulmonary disease with (acute) exacerbation: Secondary | ICD-10-CM

## 2018-07-08 DIAGNOSIS — G5603 Carpal tunnel syndrome, bilateral upper limbs: Secondary | ICD-10-CM | POA: Diagnosis not present

## 2018-07-08 DIAGNOSIS — M5417 Radiculopathy, lumbosacral region: Secondary | ICD-10-CM | POA: Diagnosis not present

## 2018-07-08 DIAGNOSIS — Z79899 Other long term (current) drug therapy: Secondary | ICD-10-CM | POA: Diagnosis not present

## 2018-07-08 DIAGNOSIS — I10 Essential (primary) hypertension: Secondary | ICD-10-CM

## 2018-07-08 DIAGNOSIS — G43909 Migraine, unspecified, not intractable, without status migrainosus: Secondary | ICD-10-CM | POA: Diagnosis not present

## 2018-07-08 DIAGNOSIS — E782 Mixed hyperlipidemia: Secondary | ICD-10-CM

## 2018-07-08 MED ORDER — PANTOPRAZOLE SODIUM 40 MG PO TBEC: 40.0000 mg | DELAYED_RELEASE_TABLET | Freq: Two times a day (BID) | ORAL | 0 refills | Status: DC

## 2018-07-08 MED ORDER — ATORVASTATIN CALCIUM 40 MG PO TABS: 40.0000 mg | ORAL_TABLET | Freq: Every day | ORAL | 0 refills | Status: DC

## 2018-07-08 MED ORDER — LISINOPRIL 20 MG PO TABS: 20.0000 mg | ORAL_TABLET | Freq: Every day | ORAL | 0 refills | Status: DC

## 2018-07-08 MED ORDER — CLARITHROMYCIN 500 MG PO TABS: 500.0000 mg | ORAL_TABLET | Freq: Two times a day (BID) | ORAL | 0 refills | Status: AC

## 2018-07-08 NOTE — Progress Notes (Signed)
Virtual Visit via telephone Note Due to COVID-19, visit is conducted virtually and was requested by patient. This visit type was conducted due to national recommendations for restrictions regarding the COVID-19 Pandemic (e.g. social distancing) in an effort to limit this patient's exposure and mitigate transmission in our community. All issues noted in this document were discussed and addressed.  A physical exam was not performed with this format.   I connected with [name] on 07/08/18 at 41 by telephone and verified that I am speaking with the correct person using two identifiers. [name] is currently located at home and family is currently with them during visit. The provider, Monia Pouch, FNP is located in their office at time of visit.  I discussed the limitations, risks, security and privacy concerns of performing an evaluation and management service by telephone and the availability of in person appointments. I also discussed with the patient that there may be a patient responsible charge related to this service. The patient expressed understanding and agreed to proceed.  Subjective:  Patient ID: [name], female    DOB: January 12, 1956, 63 y.o.   MRN: [id number]  Chief Complaint:  COPD   HPI: [name] is a 63 y.o. female presenting on 07/08/2018 for COPD   Pt reports increasing symptoms over the last few days. States she was seen and placed on steroids and an antibiotic. States she is not feeling better.   COPD  She complains of cough, difficulty breathing, frequent throat clearing, shortness of breath, sputum production and wheezing. There is no chest tightness, hemoptysis or hoarse voice. The current episode started 1 to 4 weeks ago. The problem occurs constantly. The problem has been gradually worsening. The cough is productive of brown sputum. Associated symptoms include dyspnea on exertion, ear pain and PND. Pertinent negatives include no appetite change,  chest pain, ear congestion, fever, headaches, heartburn, malaise/fatigue, myalgias, nasal congestion, orthopnea, postnasal drip, rhinorrhea, sneezing, sore throat, sweats, trouble swallowing or weight loss. Her symptoms are aggravated by climbing stairs, lying down and any activity. Her symptoms are alleviated by beta-agonist and oral steroids. She reports minimal improvement on treatment. Her past medical history is significant for COPD.     Relevant past medical, surgical, family, and social history reviewed and updated as indicated.  Allergies and medications reviewed and updated.   Past Medical History:  Diagnosis Date  . Allergy   . Arthritis   . Constipation    uses OTC meds with help  . COPD (chronic obstructive pulmonary disease) ([place])   . Coronary artery disease    minimal nonobstructive   . Depression   . GERD (gastroesophageal reflux disease)   . Hx of adenomatous polyp of colon 10/03/2014  . Hypercholesteremia   . Hypertension   . Panic attacks   . Perimenopausal   . Skin cancer   . Tobacco user     Past Surgical History:  Procedure Laterality Date  . ABDOMINAL HYSTERECTOMY    . COLONOSCOPY    . Right foot toe surgery    . UPPER GASTROINTESTINAL ENDOSCOPY    . vocal cord     polyp removal    Social History   Socioeconomic History  . Marital status: Single    Spouse name: Not on file  . Number of children: Not on file  . Years of education: Not on file  . Highest education level: Not on file  Occupational History  . 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 Financial resource strain: Not on file  . Food insecurity:    Worry: Not on file    Inability: Not on file  . Transportation needs:    Medical: Not on file    Non-medical: Not on file  Tobacco Use  . Smoking status: Current Every Day Smoker    Packs/day: 0.50    Years: 40.00    Pack years: 20.00    Types: Cigarettes    Start date: 05/01/1969  . Smokeless  tobacco: Never Used  . Tobacco comment: would like information. has tried chantix and wellbutrin  Substance and Sexual Activity  . Alcohol use: No    Alcohol/week: 0.0 standard drinks    Comment: Per pt no 04-02-15.  . Drug use: No    Comment: per pt, Cocaine was in her system October and November 2016 when she went to the pain clinic 04-02-15.  Marland Kitchen Sexual activity: Never  Lifestyle  . Physical activity:    Days per week: Not on file    Minutes per session: Not on file  . Stress: Not on file  Relationships  . Social connections:    Talks on phone: Not on file    Gets together: Not on file    Attends religious service: Not on file    Active member of club or organization: Not on file    Attends meetings of clubs or organizations: Not on file    Relationship status: Not on file  . Intimate partner violence:    Fear of current or ex partner: Not on file    Emotionally abused: Not on file    Physically abused: Not on file    Forced sexual activity: Not on file  Other Topics Concern  . Not on file  Social History Narrative   Lives in [place] with husband   Under a lot of family stress   Goes to [facility] Care.          Outpatient Encounter Medications as of 07/08/2018  Medication Sig  . albuterol (VENTOLIN HFA) 108 (90 Base) MCG/ACT inhaler Inhale 2 puffs into the lungs every 4 (four) hours as needed for wheezing or shortness of breath.  Marland Kitchen amLODipine (NORVASC) 5 MG tablet Take 1 tablet (5 mg total) by mouth daily.  . Ascorbic Acid (VITAMIN C) 1000 MG tablet Take 1,000 mg by mouth daily.  Marland Kitchen aspirin EC 81 MG tablet Take 81 mg by mouth daily.  Marland Kitchen atorvastatin (LIPITOR) 40 MG tablet Take 1 tablet (40 mg total) by mouth at bedtime.  . budesonide-formoterol (SYMBICORT) 160-4.5 MCG/ACT inhaler INHALE TWO PUFFS INTO THE LUNGS TWICE DAILY  . cefdinir (OMNICEF) 300 MG capsule Take 1 capsule (300 mg total) by mouth 2 (two) times daily. 1 po BID  . clarithromycin (BIAXIN) 500  MG tablet Take 1 tablet (500 mg total) by mouth 2 (two) times daily for 5 days.  . clonazePAM (KLONOPIN) 1 MG tablet Take 1 tablet (1 mg total) by mouth 3 (three) times daily as needed for anxiety.  . DULoxetine (CYMBALTA) 60 MG capsule Take 1 capsule (60 mg total) by mouth 2 (two) times daily.  . fish oil-omega-3 fatty acids 1000 MG capsule Take 1 g by mouth daily.   Marland Kitchen gabapentin (NEURONTIN) 300 MG capsule Take 1 cap QAM and 2 cap QHS  . ipratropium-albuterol (DUONEB) 0.5-2.5 (3) MG/3ML SOLN Take 3 mLs by nebulization every 6 (six) hours as needed.  Marland Kitchen  lisinopril (ZESTRIL) 20 MG tablet Take 1 tablet (20 mg total) by mouth daily.  . meloxicam (MOBIC) 15 MG tablet Take 15 mg by mouth daily.  . Oxycodone HCl 10 MG TABS TAKE ONE TABLET BY MOUTH EVERY 4 TO 6 HOURS AS NEEDED FOR PAIN  . pantoprazole (PROTONIX) 40 MG tablet Take 1 tablet (40 mg total) by mouth 2 (two) times daily.  . predniSONE (DELTASONE) 10 MG tablet Take 1 tablet (10 mg total) by mouth daily with breakfast.  . Tiotropium Bromide Monohydrate (SPIRIVA RESPIMAT) 2.5 MCG/ACT AERS Inhale 2 puffs into the lungs daily.   No facility-administered encounter medications on file as of 07/08/2018.     Allergies  Allergen Reactions  . Lorcet 10-650 [Hydrocodone-Acetaminophen] Itching  . Sulfa Antibiotics     aching  . Tape     Please use paper tape    Review of Systems  Constitutional: Positive for activity change and fatigue. Negative for appetite change, chills, diaphoresis, fever, malaise/fatigue, unexpected weight change and weight loss.  HENT: Positive for ear pain. Negative for hoarse voice, postnasal drip, rhinorrhea, sneezing, sore throat and trouble swallowing.   Respiratory: Positive for cough, sputum production, shortness of breath and wheezing. Negative for hemoptysis and chest tightness.   Cardiovascular: Positive for dyspnea on exertion and PND. Negative for chest pain, palpitations and leg swelling.  Gastrointestinal:  Negative for heartburn.  Musculoskeletal: Negative for arthralgias and myalgias.  Neurological: Negative for dizziness, weakness and headaches.  Psychiatric/Behavioral: Negative for confusion.  All other systems reviewed and are negative.        Observations/Objective: No vital signs or physical exam, this was a telephone or virtual health encounter.  Pt alert and oriented, answers all questions appropriately, and able to speak in full sentences.    Assessment and Plan: Diagnoses and all orders for this visit:  COPD exacerbation ([place]) Ongoing symptoms. Continue steroids, antibiotics, and duonebs as prescribed. Add Biaxin. Report any new or worsening symptoms. Pt aware of symptoms that require emergent evaluation. Follow up in one week for reevaluation.  -     clarithromycin (BIAXIN) 500 MG tablet; Take 1 tablet (500 mg total) by mouth 2 (two) times daily for 5 days.     Follow Up Instructions: Return in about 1 week (around 07/15/2018), or if symptoms worsen or fail to improve, for COPD exacerbation.    I discussed the assessment and treatment plan with the patient. The patient was provided an opportunity to ask questions and all were answered. The patient agreed with the plan and demonstrated an understanding of the instructions.   The patient was advised to call back or seek an in-person evaluation if the symptoms worsen or if the condition fails to improve as anticipated.  The above assessment and management plan was discussed with the patient. The patient verbalized understanding of and has agreed to the management plan. Patient is aware to call the clinic if symptoms persist or worsen. Patient is aware when to return to the clinic for a follow-up visit. Patient educated on when it is appropriate to go to the emergency department.    I provided 15 minutes of non-face-to-face time during this encounter. The call started at 1020. The call ended at 1035. The other time was used  for coordination of care.    Monia Pouch, FNP-C [facility] [address] [place], [place] [address] [phone]

## 2018-07-10 ENCOUNTER — Encounter (HOSPITAL_COMMUNITY): Payer: Self-pay | Admitting: Emergency Medicine

## 2018-07-10 ENCOUNTER — Emergency Department (HOSPITAL_COMMUNITY)
Admission: EM | Admit: 2018-07-10 | Discharge: 2018-07-11 | Disposition: A | Discharge status: Home / Self Care | Payer: Medicare Other | Attending: Emergency Medicine | Admitting: Emergency Medicine

## 2018-07-10 ENCOUNTER — Other Ambulatory Visit: Payer: Self-pay

## 2018-07-10 ENCOUNTER — Other Ambulatory Visit: Payer: Self-pay | Admitting: *Deleted

## 2018-07-10 DIAGNOSIS — Z85828 Personal history of other malignant neoplasm of skin: Secondary | ICD-10-CM | POA: Diagnosis not present

## 2018-07-10 DIAGNOSIS — R0602 Shortness of breath: Secondary | ICD-10-CM | POA: Diagnosis present

## 2018-07-10 DIAGNOSIS — H748X1 Other specified disorders of right middle ear and mastoid: Secondary | ICD-10-CM | POA: Diagnosis not present

## 2018-07-10 DIAGNOSIS — I10 Essential (primary) hypertension: Secondary | ICD-10-CM

## 2018-07-10 DIAGNOSIS — H9201 Otalgia, right ear: Secondary | ICD-10-CM | POA: Diagnosis not present

## 2018-07-10 DIAGNOSIS — R05 Cough: Secondary | ICD-10-CM | POA: Insufficient documentation

## 2018-07-10 DIAGNOSIS — I2510 Atherosclerotic heart disease of native coronary artery without angina pectoris: Secondary | ICD-10-CM | POA: Diagnosis not present

## 2018-07-10 DIAGNOSIS — Z03818 Encounter for observation for suspected exposure to other biological agents ruled out: Secondary | ICD-10-CM | POA: Diagnosis not present

## 2018-07-10 DIAGNOSIS — Z79899 Other long term (current) drug therapy: Secondary | ICD-10-CM | POA: Diagnosis not present

## 2018-07-10 DIAGNOSIS — F17210 Nicotine dependence, cigarettes, uncomplicated: Secondary | ICD-10-CM | POA: Diagnosis not present

## 2018-07-10 DIAGNOSIS — H65191 Other acute nonsuppurative otitis media, right ear: Secondary | ICD-10-CM

## 2018-07-10 DIAGNOSIS — J441 Chronic obstructive pulmonary disease with (acute) exacerbation: Secondary | ICD-10-CM | POA: Insufficient documentation

## 2018-07-10 DIAGNOSIS — Z20828 Contact with and (suspected) exposure to other viral communicable diseases: Secondary | ICD-10-CM | POA: Insufficient documentation

## 2018-07-10 DIAGNOSIS — Z7982 Long term (current) use of aspirin: Secondary | ICD-10-CM | POA: Diagnosis not present

## 2018-07-10 LAB — BASIC METABOLIC PANEL
Anion gap: 7 (ref 5–15)
BUN: 11 mg/dL (ref 8–23)
CO2: 27 mmol/L (ref 22–32)
Calcium: 9.5 mg/dL (ref 8.9–10.3)
Chloride: 106 mmol/L (ref 98–111)
Creatinine, Ser: 0.75 mg/dL (ref 0.44–1.00)
GFR calc Af Amer: >60 mL/min (ref >60)
GFR calc non Af Amer: >60 mL/min (ref >60)
Glucose, Bld: 93 mg/dL (ref 70–99)
Potassium: 3.4 mmol/L — ABNORMAL LOW (ref 3.5–5.1)
Sodium: 140 mmol/L (ref 135–145)

## 2018-07-10 LAB — CBC
HCT: 44.2 % (ref 36.0–46.0)
Hemoglobin: 14.2 g/dL (ref 12.0–15.0)
MCH: 30.5 pg (ref 26.0–34.0)
MCHC: 32.1 g/dL (ref 30.0–36.0)
MCV: 94.8 fL (ref 80.0–100.0)
Platelets: 393 10*3/uL (ref 150–400)
RBC: 4.66 MIL/uL (ref 3.87–5.11)
RDW: 15.6 % — ABNORMAL HIGH (ref 11.5–15.5)
WBC: 12.3 10*3/uL — ABNORMAL HIGH (ref 4.0–10.5)
nRBC: 0 % (ref 0.0–0.2)

## 2018-07-10 MED ORDER — AMLODIPINE BESYLATE 5 MG PO TABS: 5.0000 mg | ORAL_TABLET | Freq: Every day | ORAL | 1 refills | Status: DC

## 2018-07-10 NOTE — ED Triage Notes (Signed)
Pt reports ear drainage, increased SOB, development of a cough X1 week and a headache.  Pt has seen PCP, placed on two different antibiotics and steroid, continues to get worse.  Not in respiratory distress at this time.

## 2018-07-11 ENCOUNTER — Emergency Department (HOSPITAL_COMMUNITY): Payer: Medicare Other

## 2018-07-11 DIAGNOSIS — R05 Cough: Secondary | ICD-10-CM | POA: Diagnosis not present

## 2018-07-11 MED ORDER — ALBUTEROL SULFATE HFA 108 (90 BASE) MCG/ACT IN AERS
4.0000 | INHALATION_SPRAY | RESPIRATORY_TRACT | Status: DC | PRN
  Administered 2018-07-11: 4 via RESPIRATORY_TRACT
  Filled 2018-07-11: qty 6.7

## 2018-07-11 MED ORDER — ACETAMINOPHEN 500 MG PO TABS
1000.0000 mg | ORAL_TABLET | Freq: Once | ORAL | Status: AC
  Administered 2018-07-11: 1000 mg via ORAL
  Filled 2018-07-11: qty 2

## 2018-07-11 NOTE — ED Notes (Signed)
Pt ambulated with no assistance, steady gait. Pts average O2 98%. Audible wheezing but no complaints.

## 2018-07-11 NOTE — ED Provider Notes (Signed)
[facility] EMERGENCY DEPARTMENT Provider Note   CSN: [id number] Arrival date & time: 07/10/18  2212    History   Chief Complaint Chief Complaint  Patient presents with  . Shortness of Breath  . Cough    HPI [name] is a 63 y.o. female.     Patient with history of COPD presents the emergency department with complaint of wheezing, shortness of breath, cough, and right ear drainage and pain with headache.  Patient states that she has been having worsening shortness of breath over the past 6 weeks or so.  She has seen her primary care doctor and pulmonologist.  She has been on antibiotics and a steroid burst.  More recently she was prescribed Biaxin, cefdinir, and daily prednisone 10 mg.  Avril days ago she developed bleeding from her right ear which has resolved.  She complains of continued pain around the ear and associated headache.  Her hearing is somewhat muffled.  She denies any fevers.  No known contacts with COVID-19 or other infections.  No runny nose or sore throat.  Patient just does not feel like she is getting much better.     Past Medical History:  Diagnosis Date  . Allergy   . Arthritis   . Constipation    uses OTC meds with help  . COPD (chronic obstructive pulmonary disease) ([place])   . Coronary artery disease    minimal nonobstructive   . Depression   . GERD (gastroesophageal reflux disease)   . Hx of adenomatous polyp of colon 10/03/2014  . Hypercholesteremia   . Hypertension   . Panic attacks   . Perimenopausal   . Skin cancer   . Tobacco user     Patient Active Problem List   Diagnosis Date Noted  . Major depression 04/02/2015  . Benzodiazepine abuse, continuous ([place]) 01/22/2015  . Opiate dependence ([place]) 01/22/2015  . Hx of adenomatous polyp of colon 10/03/2014  . Vitamin D deficiency 05/08/2014  . COPD (chronic obstructive pulmonary disease) ([place]) 05/06/2011  . Hyperlipidemia 05/05/2011  . INSOMNIA 11/13/2008  . TOBACCO  ABUSE 11/10/2008  . Essential hypertension 11/10/2008  . Osteoarthritis 11/10/2008    Past Surgical History:  Procedure Laterality Date  . ABDOMINAL HYSTERECTOMY    . COLONOSCOPY    . Right foot toe surgery    . UPPER GASTROINTESTINAL ENDOSCOPY    . vocal cord     polyp removal     OB History   No obstetric history on file.      Home Medications    Prior to Admission medications   Medication Sig Start Date End Date Taking? Authorizing Provider  albuterol (VENTOLIN HFA) 108 (90 Base) MCG/ACT inhaler Inhale 2 puffs into the lungs every 4 (four) hours as needed for wheezing or shortness of breath. 06/21/18  Yes Stacks, Cletus Gash, MD  amLODipine (NORVASC) 5 MG tablet Take 1 tablet (5 mg total) by mouth daily. 07/10/18  Yes Claretta Fraise, MD  Ascorbic Acid (VITAMIN C) 1000 MG tablet Take 1,000 mg by mouth daily.   Yes [provider]  aspirin EC 81 MG tablet Take 81 mg by mouth daily.   Yes [provider]  atorvastatin (LIPITOR) 40 MG tablet Take 1 tablet (40 mg total) by mouth at bedtime. 07/08/18  Yes Stacks, Cletus Gash, MD  budesonide-formoterol (SYMBICORT) 160-4.5 MCG/ACT inhaler INHALE TWO PUFFS INTO THE LUNGS TWICE DAILY Patient taking differently: Inhale 2 puffs into the lungs 2 (two) times a day. INHALE TWO  PUFFS INTO THE LUNGS TWICE DAILY 06/21/18  Yes Claretta Fraise, MD  cefdinir (OMNICEF) 300 MG capsule Take 1 capsule (300 mg total) by mouth 2 (two) times daily. 1 po BID 07/06/18  Yes Terald Sleeper, PA-C  clarithromycin (BIAXIN) 500 MG tablet Take 1 tablet (500 mg total) by mouth 2 (two) times daily for 5 days. 07/08/18 07/13/18 Yes Rakes, Connye Burkitt, FNP  clonazePAM (KLONOPIN) 1 MG tablet Take 1 tablet (1 mg total) by mouth 3 (three) times daily as needed for anxiety. 06/04/18  Yes Cloria Spring, MD  DULoxetine (CYMBALTA) 60 MG capsule Take 1 capsule (60 mg total) by mouth 2 (two) times daily. 06/04/18  Yes Cloria Spring, MD  fish oil-omega-3 fatty acids 1000 MG capsule  Take 1 g by mouth daily.    Yes [provider]  gabapentin (NEURONTIN) 300 MG capsule Take 300-600 mg by mouth See admin instructions. Take 1 capsule in the morning and 2 capsule at bedtime 06/15/18  Yes [provider]  ipratropium-albuterol (DUONEB) 0.5-2.5 (3) MG/3ML SOLN Take 3 mLs by nebulization every 6 (six) hours as needed. 05/31/18  Yes Mannam, Praveen, MD  lisinopril (ZESTRIL) 20 MG tablet Take 1 tablet (20 mg total) by mouth daily. 07/08/18  Yes Stacks, Cletus Gash, MD  meloxicam (MOBIC) 15 MG tablet Take 15 mg by mouth daily. 11/17/17  Yes [provider]  Oxycodone HCl 10 MG TABS Take 10 mg by mouth every 6 (six) hours as needed (pain).  07/30/17  Yes [provider]  pantoprazole (PROTONIX) 40 MG tablet Take 1 tablet (40 mg total) by mouth 2 (two) times daily. 07/08/18  Yes Claretta Fraise, MD  predniSONE (DELTASONE) 10 MG tablet Take 1 tablet (10 mg total) by mouth daily with breakfast. 06/21/18  Yes Stacks, Cletus Gash, MD  Tiotropium Bromide Monohydrate (SPIRIVA RESPIMAT) 2.5 MCG/ACT AERS Inhale 2 puffs into the lungs daily. 06/21/18  Yes Claretta Fraise, MD    Family History Family History  Problem Relation Age of Onset  . Heart disease Mother   . Heart attack Mother   . Heart attack Father   . Heart attack Brother   . Colon cancer Brother 67       died at age 11  . Anxiety disorder Brother   . Depression Brother   . Alcohol abuse Brother   . Colon polyps Brother   . Colon polyps Brother   . Colon polyps Sister   . Esophageal cancer Sister   . Anxiety disorder Sister   . Depression Sister   . Colon polyps Sister   . Drug abuse Son   . Rectal cancer Neg Hx   . Stomach cancer Neg Hx     Social History Social History   Tobacco Use  . Smoking status: Current Every Day Smoker    Packs/day: 0.50    Years: 40.00    Pack years: 20.00    Types: Cigarettes    Start date: 05/01/1969  . Smokeless tobacco: Never Used  . Tobacco comment: would like  information. has tried chantix and wellbutrin  Substance Use Topics  . Alcohol use: No    Alcohol/week: 0.0 standard drinks    Comment: Per pt no 04-02-15.  . Drug use: No    Comment: per pt, Cocaine was in her system October and November 2016 when she went to the pain clinic 04-02-15.     Allergies   Lorcet 10-650 [hydrocodone-acetaminophen]; Sulfa antibiotics; and Tape   Review of Systems Review  of Systems  Constitutional: Negative for fever.  HENT: Positive for ear discharge and ear pain. Negative for rhinorrhea and sore throat.   Eyes: Negative for redness.  Respiratory: Positive for cough, shortness of breath and wheezing.   Cardiovascular: Negative for chest pain.  Gastrointestinal: Negative for abdominal pain, diarrhea, nausea and vomiting.  Genitourinary: Negative for dysuria.  Musculoskeletal: Negative for myalgias.  Skin: Negative for rash.  Neurological: Positive for headaches.     Physical Exam Updated Vital Signs BP (!) 149/80 (BP Location: Right Arm)   Pulse 75   Temp 98.2 F (36.8 C) (Oral)   Resp (!) 21   Ht 5\' 5"  (1.651 m)   Wt 68 kg   SpO2 99%   BMI 24.96 kg/m   Physical Exam Vitals signs and nursing note reviewed.  Constitutional:      Appearance: She is well-developed.  HENT:     Head: Normocephalic and atraumatic.     Jaw: No trismus.     Right Ear: Ear canal and external ear normal. Tympanic membrane is bulging. Tympanic membrane is not injected.     Left Ear: Tympanic membrane, ear canal and external ear normal. Tympanic membrane is not injected or bulging.     Ears:      Nose: Nose normal. No mucosal edema or rhinorrhea.     Mouth/Throat:     Mouth: Mucous membranes are not dry. No oral lesions.     Pharynx: Uvula midline. No oropharyngeal exudate, posterior oropharyngeal erythema or uvula swelling.     Tonsils: No tonsillar abscesses.  Eyes:     General:        Right eye: No discharge.        Left eye: No discharge.      Conjunctiva/sclera: Conjunctivae normal.  Neck:     Musculoskeletal: Normal range of motion and neck supple.   Cardiovascular:     Rate and Rhythm: Normal rate and regular rhythm.     Heart sounds: Normal heart sounds.  Pulmonary:     Effort: Pulmonary effort is normal. No respiratory distress.     Breath sounds: Wheezing (Mild scattered wheezes, all fields.) present. No rhonchi or rales.  Abdominal:     Palpations: Abdomen is soft.     Tenderness: There is no abdominal tenderness.  Musculoskeletal: Normal range of motion.  Lymphadenopathy:     Cervical: No cervical adenopathy.  Skin:    General: Skin is warm and dry.  Neurological:     Mental Status: She is alert.      ED Treatments / Results  Labs (all labs ordered are listed, but only abnormal results are displayed) Labs Reviewed  BASIC METABOLIC PANEL - Abnormal; Notable for the following components:      Result Value   Potassium 3.4 (*)    All other components within normal limits  CBC - Abnormal; Notable for the following components:   WBC 12.3 (*)    RDW 15.6 (*)    All other components within normal limits  NOVEL CORONAVIRUS, NAA (HOSPITAL ORDER, SEND-OUT TO REF LAB)    EKG EKG Interpretation  Date/Time:  Saturday Jul 10 2018 22:27:54 EDT Ventricular Rate:  88 PR Interval:  132 QRS Duration: 76 QT Interval:  352 QTC Calculation: 425 R Axis:   49 Text Interpretation:  Sinus rhythm with Premature atrial complexes Otherwise normal ECG Interpretation limited secondary to artifact Confirmed by Ripley Fraise [phone]) on 07/11/2018 12:12:59 AM   Radiology Dg Chest Portable 1 View  Result Date: 07/11/2018 CLINICAL DATA:  63 year old female with cough. EXAM: PORTABLE CHEST 1 VIEW COMPARISON:  Chest radiograph dated 05/27/2018 FINDINGS: Centrilobular emphysema. No focal consolidation, pleural effusion, or pneumothorax. The cardiac silhouette is within normal limits. Atherosclerotic calcification of the aortic  arch. No acute osseous pathology. IMPRESSION: No active disease. Electronically Signed   By: Anner Crete M.D.   On: 07/11/2018 01:21    Procedures Procedures (including critical care time)  Medications Ordered in ED Medications  albuterol (VENTOLIN HFA) 108 (90 Base) MCG/ACT inhaler 4 puff (4 puffs Inhalation Given 07/11/18 0140)  acetaminophen (TYLENOL) tablet 1,000 mg (1,000 mg Oral Given 07/11/18 0140)     Initial Impression / Assessment and Plan / ED Course  I have reviewed the triage vital signs and the nursing notes.  Pertinent labs & imaging results that were available during my care of the patient were reviewed by me and considered in my medical decision making (see chart for details).        Patient seen and examined. Work-up initiated. Medications ordered.   Vital signs reviewed and are as follows: BP (!) 149/80 (BP Location: Right Arm)   Pulse 75   Temp 98.2 F (36.8 C) (Oral)   Resp (!) 21   Ht 5\' 5"  (1.651 m)   Wt 68 kg   SpO2 99%   BMI 24.96 kg/m   1:29 AM X-ray images personally reviewed and interpreted.    Patient has ambulated and maintained oxygen saturation in high 90s despite some wheezing.  Low concern for coronavirus however given patient's comorbidities, will check send out test.  3:00 AM patient discussed with and seen by Dr. Christy Gentles.  Patient is stable for discharge at this time.  Encouraged use of over-the-counter medications for ear pain.  She is already being appropriately treated with clarithromycin and cefdinir for both her COPD exacerbation as well as her possible right middle ear infection/effusion.  Strongly encourage PCP follow-up.  COVID send out test pending.  Patient encouraged to return with fever, worsening shortness of breath or trouble breathing, chest pain, or new symptoms.  Final Clinical Impressions(s) / ED Diagnoses   Final diagnoses:  Right ear pain  COPD exacerbation (HCC)  Acute MEE (middle ear effusion), right    Patient here tonight with complaint of continued wheezing and shortness of breath in setting of COPD as well as right ear pain.  Patient has what appears to be a right middle ear effusion.  She is currently undergoing treatment for COPD with steroids, antibiotics, breathing medications.  She continues to take this.  She has some wheezing on exam however is not hypoxic.  She has been able to ambulate without dropping her oxygen saturation.  At this point, would continue her current treatments.  I do not feel that she requires admission to the hospital tonight.  COVID send out test given comorbidities although I have a low suspicion for this.  She will need to follow-up with her PCP/pulmonologist.  ED Discharge Orders    None       Carlisle Cater, Hershal Coria 07/11/18 Halford Chessman, MD 07/11/18 [phone]

## 2018-07-11 NOTE — Discharge Instructions (Signed)
Please read and follow all provided instructions.  Your diagnoses today include:  1. Right ear pain   2. COPD exacerbation ([place])   3. Acute MEE (middle ear effusion), right     Tests performed today include:  Chest x-ray - did not show pneumonia  Blood counts and electrolytes  Coronavirus test -results in 24 to 48 hours  Vital signs. See below for your results today.   Medications prescribed:   None  Take any prescribed medications only as directed.  Home care instructions:  Follow any educational materials contained in this packet.  Continue to take the prednisone, cefdinir, and clarithromycin prescribed by your primary care doctor.  Follow-up instructions: Please follow-up with your primary care provider in the next 3 days for further evaluation of your symptoms and management of your COPD.   Return instructions:   Please return to the Emergency Department if you experience worsening symptoms.  Please return with worsening wheezing, shortness of breath, or difficulty breathing.  Return with persistent fever above 101F.   Please return if you have any other emergent concerns.  Additional Information:  Your vital signs today were: BP 139/87    Pulse 75    Temp 98.2 F (36.8 C) (Oral)    Resp 18    Ht 5\' 5"  (1.651 m)    Wt 68 kg    SpO2 98%    BMI 24.96 kg/m  If your blood pressure (BP) was elevated above 135/85 this visit, please have this repeated by your doctor within one month. --------------

## 2018-07-12 LAB — NOVEL CORONAVIRUS, NAA (HOSP ORDER, SEND-OUT TO REF LAB; TAT 18-24 HRS): SARS-CoV-2, NAA: NOT DETECTED

## 2018-07-15 ENCOUNTER — Encounter: Payer: Self-pay | Admitting: Family Medicine

## 2018-07-15 ENCOUNTER — Other Ambulatory Visit: Payer: Self-pay

## 2018-07-15 ENCOUNTER — Ambulatory Visit (INDEPENDENT_AMBULATORY_CARE_PROVIDER_SITE_OTHER): Payer: Medicare Other | Admitting: Family Medicine

## 2018-07-15 DIAGNOSIS — J411 Mucopurulent chronic bronchitis: Secondary | ICD-10-CM

## 2018-07-15 NOTE — Progress Notes (Signed)
Virtual Visit via telephone Note Due to COVID-19, visit is conducted virtually and was requested by patient. This visit type was conducted due to national recommendations for restrictions regarding the COVID-19 Pandemic (e.g. social distancing) in an effort to limit this patient's exposure and mitigate transmission in our community. All issues noted in this document were discussed and addressed.  A physical exam was not performed with this format.   I connected with [name] on 07/15/18 at 1150 by telephone and verified that I am speaking with the correct person using two identifiers. [name] is currently located at home and family is currently with them during visit. The provider, Monia Pouch, FNP is located in their office at time of visit.  I discussed the limitations, risks, security and privacy concerns of performing an evaluation and management service by telephone and the availability of in person appointments. I also discussed with the patient that there may be a patient responsible charge related to this service. The patient expressed understanding and agreed to proceed.  Subjective:  Patient ID: [name], female    DOB: 12-Dec-1955, 63 y.o.   MRN: [id number]  Chief Complaint:  COPD   HPI: [name] is a 63 y.o. female presenting on 07/15/2018 for COPD   Pt is following up for a COPD exacerbation. She was seen on 07/08/2018 and placed on antibiotics and steroids. She has a couple of antibiotics left. She reports she is feeling much better. She states her breathing has improved and she is not coughing as much. States she did have to go to the ED on 07/10/2018 due to right ear pain and shortness of breath. States she was not admitted and told to continue prescribed therapy from PCP office. Pt states she has improved greatly since then.     Relevant past medical, surgical, family, and social history reviewed and updated as indicated.  Allergies and medications  reviewed and updated.   Past Medical History:  Diagnosis Date  . Allergy   . Arthritis   . Constipation    uses OTC meds with help  . COPD (chronic obstructive pulmonary disease) ([place])   . Coronary artery disease    minimal nonobstructive   . Depression   . GERD (gastroesophageal reflux disease)   . Hx of adenomatous polyp of colon 10/03/2014  . Hypercholesteremia   . Hypertension   . Panic attacks   . Perimenopausal   . Skin cancer   . Tobacco user     Past Surgical History:  Procedure Laterality Date  . ABDOMINAL HYSTERECTOMY    . COLONOSCOPY    . Right foot toe surgery    . UPPER GASTROINTESTINAL ENDOSCOPY    . vocal cord     polyp removal    Social History   Socioeconomic History  . Marital status: Single    Spouse name: Not on file  . Number of children: Not on file  . Years of education: Not on file  . Highest education level: Not on file  Occupational History  . 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 Financial resource strain: Not on file  . Food insecurity:    Worry: Not on file    Inability: Not on file  . Transportation needs:    Medical: Not on file    Non-medical: Not on file  Tobacco Use  . Smoking status: Current Every Day Smoker    Packs/day: 0.50  Years: 40.00    Pack years: 20.00    Types: Cigarettes    Start date: 05/01/1969  . Smokeless tobacco: Never Used  . Tobacco comment: would like information. has tried chantix and wellbutrin  Substance and Sexual Activity  . Alcohol use: No    Alcohol/week: 0.0 standard drinks    Comment: Per pt no 04-02-15.  . Drug use: No    Comment: per pt, Cocaine was in her system October and November 2016 when she went to the pain clinic 04-02-15.  Marland Kitchen Sexual activity: Never  Lifestyle  . Physical activity:    Days per week: Not on file    Minutes per session: Not on file  . Stress: Not on file  Relationships  . Social connections:    Talks on phone: Not on file     Gets together: Not on file    Attends religious service: Not on file    Active member of club or organization: Not on file    Attends meetings of clubs or organizations: Not on file    Relationship status: Not on file  . Intimate partner violence:    Fear of current or ex partner: Not on file    Emotionally abused: Not on file    Physically abused: Not on file    Forced sexual activity: Not on file  Other Topics Concern  . Not on file  Social History Narrative   Lives in [place] with husband   Under a lot of family stress   Goes to [facility].          Outpatient Encounter Medications as of 07/15/2018  Medication Sig  . albuterol (VENTOLIN HFA) 108 (90 Base) MCG/ACT inhaler Inhale 2 puffs into the lungs every 4 (four) hours as needed for wheezing or shortness of breath.  Marland Kitchen amLODipine (NORVASC) 5 MG tablet Take 1 tablet (5 mg total) by mouth daily.  . Ascorbic Acid (VITAMIN C) 1000 MG tablet Take 1,000 mg by mouth daily.  Marland Kitchen aspirin EC 81 MG tablet Take 81 mg by mouth daily.  Marland Kitchen atorvastatin (LIPITOR) 40 MG tablet Take 1 tablet (40 mg total) by mouth at bedtime.  . budesonide-formoterol (SYMBICORT) 160-4.5 MCG/ACT inhaler INHALE TWO PUFFS INTO THE LUNGS TWICE DAILY (Patient taking differently: Inhale 2 puffs into the lungs 2 (two) times a day. INHALE TWO PUFFS INTO THE LUNGS TWICE DAILY)  . cefdinir (OMNICEF) 300 MG capsule Take 1 capsule (300 mg total) by mouth 2 (two) times daily. 1 po BID  . clonazePAM (KLONOPIN) 1 MG tablet Take 1 tablet (1 mg total) by mouth 3 (three) times daily as needed for anxiety.  . DULoxetine (CYMBALTA) 60 MG capsule Take 1 capsule (60 mg total) by mouth 2 (two) times daily.  . fish oil-omega-3 fatty acids 1000 MG capsule Take 1 g by mouth daily.   Marland Kitchen gabapentin (NEURONTIN) 300 MG capsule Take 300-600 mg by mouth See admin instructions. Take 1 capsule in the morning and 2 capsule at bedtime  . ipratropium-albuterol (DUONEB) 0.5-2.5 (3)  MG/3ML SOLN Take 3 mLs by nebulization every 6 (six) hours as needed.  Marland Kitchen lisinopril (ZESTRIL) 20 MG tablet Take 1 tablet (20 mg total) by mouth daily.  . meloxicam (MOBIC) 15 MG tablet Take 15 mg by mouth daily.  . Oxycodone HCl 10 MG TABS Take 10 mg by mouth every 6 (six) hours as needed (pain).   . pantoprazole (PROTONIX) 40 MG tablet Take 1 tablet (40  mg total) by mouth 2 (two) times daily.  . predniSONE (DELTASONE) 10 MG tablet Take 1 tablet (10 mg total) by mouth daily with breakfast.  . Tiotropium Bromide Monohydrate (SPIRIVA RESPIMAT) 2.5 MCG/ACT AERS Inhale 2 puffs into the lungs daily.   No facility-administered encounter medications on file as of 07/15/2018.     Allergies  Allergen Reactions  . Lorcet 10-650 [Hydrocodone-Acetaminophen] Itching  . Sulfa Antibiotics Other (See Comments)    aching  . Tape Other (See Comments)    Please use paper tape    Review of Systems  Constitutional: Negative for activity change, chills, fatigue and fever.  Respiratory: Positive for cough and shortness of breath (exertional). Negative for chest tightness and wheezing.   Cardiovascular: Negative for chest pain, palpitations and leg swelling.  Neurological: Negative for dizziness, syncope, weakness, light-headedness and headaches.  Psychiatric/Behavioral: Negative for confusion.  All other systems reviewed and are negative.        Observations/Objective: No vital signs or physical exam, this was a telephone or virtual health encounter.  Pt alert and oriented, answers all questions appropriately, and able to speak in full sentences.    Assessment and Plan: Diagnoses and all orders for this visit:  Mucopurulent chronic bronchitis ([place]) Improving. Continue controlled medications as prescribed. Complete current antibiotics. Report any new or worsening symptoms. Follow up with PCP in 4 weeks for reevaluation.     Follow Up Instructions: Return in about 4 weeks (around 08/12/2018), or  if symptoms worsen or fail to improve, for PCP, COPD.    I discussed the assessment and treatment plan with the patient. The patient was provided an opportunity to ask questions and all were answered. The patient agreed with the plan and demonstrated an understanding of the instructions.   The patient was advised to call back or seek an in-person evaluation if the symptoms worsen or if the condition fails to improve as anticipated.  The above assessment and management plan was discussed with the patient. The patient verbalized understanding of and has agreed to the management plan. Patient is aware to call the clinic if symptoms persist or worsen. Patient is aware when to return to the clinic for a follow-up visit. Patient educated on when it is appropriate to go to the emergency department.    I provided 15 minutes of non-face-to-face time during this encounter. The call started at 1150. The call ended at 1205. The other time was used for coordination of care.    Monia Pouch, FNP-C [facility] [address] [place], [place] [address] [phone]

## 2018-07-19 ENCOUNTER — Ambulatory Visit: Payer: Self-pay | Admitting: Cardiology

## 2018-07-21 ENCOUNTER — Ambulatory Visit: Payer: Medicare Other | Admitting: Family Medicine

## 2018-07-21 ENCOUNTER — Other Ambulatory Visit: Payer: Self-pay

## 2018-07-23 DIAGNOSIS — M5136 Other intervertebral disc degeneration, lumbar region: Secondary | ICD-10-CM | POA: Diagnosis not present

## 2018-07-23 DIAGNOSIS — M5417 Radiculopathy, lumbosacral region: Secondary | ICD-10-CM | POA: Diagnosis not present

## 2018-07-23 DIAGNOSIS — M5116 Intervertebral disc disorders with radiculopathy, lumbar region: Secondary | ICD-10-CM | POA: Diagnosis not present

## 2018-07-23 DIAGNOSIS — M48061 Spinal stenosis, lumbar region without neurogenic claudication: Secondary | ICD-10-CM | POA: Diagnosis not present

## 2018-07-26 DIAGNOSIS — M6281 Muscle weakness (generalized): Secondary | ICD-10-CM | POA: Diagnosis not present

## 2018-08-02 DIAGNOSIS — J449 Chronic obstructive pulmonary disease, unspecified: Secondary | ICD-10-CM | POA: Diagnosis not present

## 2018-08-03 ENCOUNTER — Other Ambulatory Visit (HOSPITAL_COMMUNITY): Payer: Self-pay | Admitting: Psychiatry

## 2018-08-03 DIAGNOSIS — F411 Generalized anxiety disorder: Secondary | ICD-10-CM

## 2018-08-10 ENCOUNTER — Encounter (HOSPITAL_COMMUNITY): Payer: Self-pay | Admitting: Psychiatry

## 2018-08-10 ENCOUNTER — Other Ambulatory Visit: Payer: Self-pay

## 2018-08-10 ENCOUNTER — Ambulatory Visit (INDEPENDENT_AMBULATORY_CARE_PROVIDER_SITE_OTHER): Payer: Medicare Other | Admitting: Psychiatry

## 2018-08-10 DIAGNOSIS — F332 Major depressive disorder, recurrent severe without psychotic features: Secondary | ICD-10-CM | POA: Diagnosis not present

## 2018-08-10 DIAGNOSIS — F411 Generalized anxiety disorder: Secondary | ICD-10-CM | POA: Diagnosis not present

## 2018-08-10 MED ORDER — DULOXETINE HCL 60 MG PO CPEP: 60.0000 mg | ORAL_CAPSULE | Freq: Two times a day (BID) | ORAL | 2 refills | Status: DC

## 2018-08-10 MED ORDER — CLONAZEPAM 1 MG PO TABS: 1.0000 mg | ORAL_TABLET | Freq: Three times a day (TID) | ORAL | 2 refills | Status: DC | PRN
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
## 2018-08-12 ENCOUNTER — Other Ambulatory Visit: Payer: Self-pay

## 2018-08-12 ENCOUNTER — Ambulatory Visit (INDEPENDENT_AMBULATORY_CARE_PROVIDER_SITE_OTHER): Payer: Medicare Other | Admitting: Family Medicine

## 2018-08-12 DIAGNOSIS — N951 Menopausal and female climacteric states: Secondary | ICD-10-CM

## 2018-08-12 MED ORDER — CLONIDINE HCL 0.1 MG PO TABS: 0.1000 mg | ORAL_TABLET | Freq: Every day | ORAL | 5 refills | Status: DC

## 2018-08-12 NOTE — Progress Notes (Signed)
Subjective:    Patient ID: [name], female    DOB: 07-Apr-1955, 63 y.o.   MRN: [id number]   HPI: [name] is a 63 y.o. female presenting for hot flashes. Occurring in increasing frequency and severity over the last several weeks. No vaginal bleeding. No discharge. Awakens at night sweating profusely.     Depression screen [facility] 2/9 07/15/2018 07/08/2018 06/21/2018 04/28/2018 04/13/2018  Decreased Interest 0 0 1 0 0  Down, Depressed, Hopeless 0 0 2 0 0  PHQ - 2 Score 0 0 3 0 0  Altered sleeping - - 1 - -  Tired, decreased energy - - 1 - -  Change in appetite - - 1 - -  Feeling bad or failure about yourself  - - 0 - -  Trouble concentrating - - 1 - -  Moving slowly or fidgety/restless - - 0 - -  Suicidal thoughts - - 0 - -  PHQ-9 Score - - 7 - -  Difficult doing work/chores - - Somewhat difficult - -  Some encounter information is confidential and restricted. Go to Review Flowsheets activity to see all data.  Some recent data might be hidden     Relevant past medical, surgical, family and social history reviewed and updated as indicated.  Interim medical history since our last visit reviewed. Allergies and medications reviewed and updated.  ROS:  Review of Systems  Constitutional: Negative.   HENT: Negative for congestion.   Eyes: Negative for visual disturbance.  Respiratory: Negative for shortness of breath.   Cardiovascular: Negative for chest pain.  Gastrointestinal: Negative for abdominal pain, constipation, diarrhea, nausea and vomiting.  Genitourinary: Negative for difficulty urinating.  Musculoskeletal: Negative for arthralgias and myalgias.  Neurological: Negative for headaches.  Psychiatric/Behavioral: Negative for sleep disturbance.     Social History   Tobacco Use  Smoking Status Current Every Day Smoker  . Packs/day: 0.50  . Years: 40.00  . Pack years: 20.00  . Types: Cigarettes  . Start date: 05/01/1969  Smokeless Tobacco Never Used  Tobacco  Comment   would like information. has tried chantix and wellbutrin       Objective:     Wt Readings from Last 3 Encounters:  07/10/18 150 lb (68 kg)  06/21/18 142 lb (64.4 kg)  05/27/18 140 lb 3.2 oz (63.6 kg)     Exam deferred. Pt. Harboring due to COVID 19. Phone visit performed.   Assessment & Plan:   1. Hot flashes due to menopause     Meds ordered this encounter  Medications  . cloNIDine (CATAPRES) 0.1 MG tablet    Sig: Take 1 tablet (0.1 mg total) by mouth at bedtime.    Dispense:  30 tablet    Refill:  5    Orders Placed This Encounter  Procedures  . Ambulatory referral to Gynecology    Referral Priority:   Routine    Referral Type:   Consultation    Referral Reason:   Specialty Services Required    Requested Specialty:   Gynecology    Number of Visits Requested:   1      Diagnoses and all orders for this visit:  Hot flashes due to menopause -     Ambulatory referral to Gynecology  Other orders -     cloNIDine (CATAPRES) 0.1 MG tablet; Take 1 tablet (0.1 mg total) by mouth at bedtime.    Virtual Visit via telephone Note  I discussed the limitations, risks, security  and privacy concerns of performing an evaluation and management service by telephone and the availability of in person appointments. The patient was identified with two identifiers. Pt.expressed understanding and agreed to proceed. Pt. Is at home. Dr. Livia Snellen is in his office.  Follow Up Instructions:   I discussed the assessment and treatment plan with the patient. The patient was provided an opportunity to ask questions and all were answered. The patient agreed with the plan and demonstrated an understanding of the instructions.   The patient was advised to call back or seek an in-person evaluation if the symptoms worsen or if the condition fails to improve as anticipated.   Total minutes including chart review and phone contact time: 15   Follow up plan: No follow-ups on file.   Claretta Fraise, MD [place]

## 2018-08-15 ENCOUNTER — Encounter: Payer: Self-pay | Admitting: Family Medicine

## 2018-08-19 DIAGNOSIS — M545 Low back pain: Secondary | ICD-10-CM | POA: Diagnosis not present

## 2018-08-19 DIAGNOSIS — G43909 Migraine, unspecified, not intractable, without status migrainosus: Secondary | ICD-10-CM | POA: Diagnosis not present

## 2018-08-19 DIAGNOSIS — M5412 Radiculopathy, cervical region: Secondary | ICD-10-CM | POA: Diagnosis not present

## 2018-08-19 DIAGNOSIS — R270 Ataxia, unspecified: Secondary | ICD-10-CM | POA: Diagnosis not present

## 2018-08-25 DIAGNOSIS — M6281 Muscle weakness (generalized): Secondary | ICD-10-CM | POA: Diagnosis not present

## 2018-09-01 DIAGNOSIS — J449 Chronic obstructive pulmonary disease, unspecified: Secondary | ICD-10-CM | POA: Diagnosis not present

## 2018-09-08 ENCOUNTER — Other Ambulatory Visit: Payer: Self-pay | Admitting: Family Medicine

## 2018-09-08 DIAGNOSIS — K219 Gastro-esophageal reflux disease without esophagitis: Secondary | ICD-10-CM

## 2018-09-08 DIAGNOSIS — E782 Mixed hyperlipidemia: Secondary | ICD-10-CM

## 2018-09-08 DIAGNOSIS — I10 Essential (primary) hypertension: Secondary | ICD-10-CM

## 2018-09-17 ENCOUNTER — Encounter: Payer: Medicare Other | Admitting: Adult Health

## 2018-09-21 ENCOUNTER — Encounter: Payer: Medicare Other | Admitting: Adult Health

## 2018-09-24 DIAGNOSIS — M6281 Muscle weakness (generalized): Secondary | ICD-10-CM | POA: Diagnosis not present

## 2018-09-30 DIAGNOSIS — G43909 Migraine, unspecified, not intractable, without status migrainosus: Secondary | ICD-10-CM | POA: Diagnosis not present

## 2018-09-30 DIAGNOSIS — M545 Low back pain: Secondary | ICD-10-CM | POA: Diagnosis not present

## 2018-09-30 DIAGNOSIS — G5603 Carpal tunnel syndrome, bilateral upper limbs: Secondary | ICD-10-CM | POA: Diagnosis not present

## 2018-10-02 DIAGNOSIS — J449 Chronic obstructive pulmonary disease, unspecified: Secondary | ICD-10-CM | POA: Diagnosis not present

## 2018-10-07 ENCOUNTER — Telehealth: Payer: Self-pay | Admitting: Cardiology

## 2018-10-07 NOTE — Telephone Encounter (Signed)
Virtual Visit Pre-Appointment Phone Call  "(Name), I am calling you today to discuss your upcoming appointment. We are currently trying to limit exposure to the virus that causes COVID-19 by seeing patients at home rather than in the office."  1. "What is the BEST phone number to call the day of the visit?" - include this in appointment notes  2. Do you have or have access to (through a family member/friend) a smartphone with video capability that we can use for your visit?" a. If yes - list this number in appt notes as cell (if different from BEST phone #) and list the appointment type as a VIDEO visit in appointment notes b. If no - list the appointment type as a PHONE visit in appointment notes  3. Confirm consent - "In the setting of the current Covid19 crisis, you are scheduled for a (phone or video) visit with your provider on (date) at (time).  Just as we do with many in-office visits, in order for you to participate in this visit, we must obtain consent.  If you'd like, I can send this to your mychart (if signed up) or email for you to review.  Otherwise, I can obtain your verbal consent now.  All virtual visits are billed to your insurance company just like a normal visit would be.  By agreeing to a virtual visit, we'd like you to understand that the technology does not allow for your provider to perform an examination, and thus may limit your provider's ability to fully assess your condition. If your provider identifies any concerns that need to be evaluated in person, we will make arrangements to do so.  Finally, though the technology is pretty good, we cannot assure that it will always work on either your or our end, and in the setting of a video visit, we may have to convert it to a phone-only visit.  In either situation, we cannot ensure that we have a secure connection.  Are you willing to proceed?" STAFF: Did the patient verbally acknowledge consent to telehealth visit? Document  YES/NO here: yes  4. Advise patient to be prepared - "Two hours prior to your appointment, go ahead and check your blood pressure, pulse, oxygen saturation, and your weight (if you have the equipment to check those) and write them all down. When your visit starts, your provider will ask you for this information. If you have an Apple Watch or Kardia device, please plan to have heart rate information ready on the day of your appointment. Please have a pen and paper handy nearby the day of the visit as well."  5. Give patient instructions for MyChart download to smartphone OR Doximity/Doxy.me as below if video visit (depending on what platform provider is using)  6. Inform patient they will receive a phone call 15 minutes prior to their appointment time (may be from unknown caller ID) so they should be prepared to answer    TELEPHONE CALL NOTE  HADDIE BRUHL has been deemed a candidate for a follow-up tele-health visit to limit community exposure during the Covid-19 pandemic. I spoke with the patient via phone to ensure availability of phone/video source, confirm preferred email & phone number, and discuss instructions and expectations.  I reminded KALIN AMRHEIN to be prepared with any vital sign and/or heart rhythm information that could potentially be obtained via home monitoring, at the time of her visit. I reminded TACORA ATHANAS to expect a phone call prior to  her visit.  Weston Anna 10/07/2018 1:29 PM   INSTRUCTIONS FOR DOWNLOADING THE MYCHART APP TO SMARTPHONE  - The patient must first make sure to have activated MyChart and know their login information - If Apple, go to [employer] and type in MyChart in the search bar and download the app. If Android, ask patient to go to [employer] and type in [place] in the search bar and download the app. The app is free but as with any other app downloads, their phone may require them to verify saved payment information or Apple/Android  password.  - The patient will need to then log into the app with their MyChart username and password, and select [facility] as their healthcare provider to link the account. When it is time for your visit, go to the MyChart app, find appointments, and click Begin Video Visit. Be sure to Select Allow for your device to access the Microphone and Camera for your visit. You will then be connected, and your provider will be with you shortly.  **If they have any issues connecting, or need assistance please contact MyChart service desk [phone])**  **If using a computer, in order to ensure the best quality for their visit they will need to use either of the following Internet Browsers: [employer], or Google Chrome**  IF USING DOXIMITY or DOXY.ME - The patient will receive a link just prior to their visit by text.     FULL LENGTH CONSENT FOR TELE-HEALTH VISIT   I hereby voluntarily request, consent and authorize [facility] and its employed or contracted physicians, physician assistants, nurse practitioners or other licensed health care professionals (the Practitioner), to provide me with telemedicine health care services (the Services") as deemed necessary by the treating Practitioner. I acknowledge and consent to receive the Services by the Practitioner via telemedicine. I understand that the telemedicine visit will involve communicating with the Practitioner through live audiovisual communication technology and the disclosure of certain medical information by electronic transmission. I acknowledge that I have been given the opportunity to request an in-person assessment or other available alternative prior to the telemedicine visit and am voluntarily participating in the telemedicine visit.  I understand that I have the right to withhold or withdraw my consent to the use of telemedicine in the course of my care at any time, without affecting my right to future care or treatment,  and that the Practitioner or I may terminate the telemedicine visit at any time. I understand that I have the right to inspect all information obtained and/or recorded in the course of the telemedicine visit and may receive copies of available information for a reasonable fee.  I understand that some of the potential risks of receiving the Services via telemedicine include:   Delay or interruption in medical evaluation due to technological equipment failure or disruption;  Information transmitted may not be sufficient (e.g. poor resolution of images) to allow for appropriate medical decision making by the Practitioner; and/or   In rare instances, security protocols could fail, causing a breach of personal health information.  Furthermore, I acknowledge that it is my responsibility to provide information about my medical history, conditions and care that is complete and accurate to the best of my ability. I acknowledge that Practitioner's advice, recommendations, and/or decision may be based on factors not within their control, such as incomplete or inaccurate data provided by me or distortions of diagnostic images or specimens that may result from electronic transmissions. I  understand that the practice of medicine is not an exact science and that Practitioner makes no warranties or guarantees regarding treatment outcomes. I acknowledge that I will receive a copy of this consent concurrently upon execution via email to the email address I last provided but may also request a printed copy by calling the office of [facility].    I understand that my insurance will be billed for this visit.   I have read or had this consent read to me.  I understand the contents of this consent, which adequately explains the benefits and risks of the Services being provided via telemedicine.   I have been provided ample opportunity to ask questions regarding this consent and the Services and have had my questions  answered to my satisfaction.  I give my informed consent for the services to be provided through the use of telemedicine in my medical care  By participating in this telemedicine visit I agree to the above.

## 2018-10-13 ENCOUNTER — Telehealth (INDEPENDENT_AMBULATORY_CARE_PROVIDER_SITE_OTHER): Payer: Medicare Other | Admitting: Cardiology

## 2018-10-13 ENCOUNTER — Encounter: Payer: Self-pay | Admitting: Cardiology

## 2018-10-13 VITALS — BP 104/77 | HR 81 | Ht 65.0 in | Wt 145.0 lb

## 2018-10-13 DIAGNOSIS — I2510 Atherosclerotic heart disease of native coronary artery without angina pectoris: Secondary | ICD-10-CM

## 2018-10-13 DIAGNOSIS — R079 Chest pain, unspecified: Secondary | ICD-10-CM

## 2018-10-13 NOTE — Progress Notes (Signed)
Virtual Visit via Telephone Note   This visit type was conducted due to national recommendations for restrictions regarding the COVID-19 Pandemic (e.g. social distancing) in an effort to limit this patient's exposure and mitigate transmission in our community.  Due to her co-morbid illnesses, this patient is at least at moderate risk for complications without adequate follow up.  This format is felt to be most appropriate for this patient at this time.  The patient did not have access to video technology/had technical difficulties with video requiring transitioning to audio format only (telephone).  All issues noted in this document were discussed and addressed.  No physical exam could be performed with this format.  Please refer to the patient's chart for her  consent to telehealth for [facility].   Date:  10/13/2018   ID:  [name], DOB 03-Apr-1955, MRN [id number]  Patient Location: Home Provider Location: Office  PCP:  Kerry Fraise, MD  Cardiologist:  Kerry Dolly, MD  Electrophysiologist:  None   Evaluation Performed:  Follow-Up Visit  Chief Complaint:  Follow up visit  History of Present Illness:    [name] is a 63 y.o. female seen today for follow up of the following medical problems.   1. Atypical chest pain/CAD - long history of atypical chest pain - cath 2008 nonobstrucive CAD - 07/2013 nuclear stress: no ischemia 01/2018 CT for lung cancer screening showed aortic atherosclerosis, with LM disease, LAD, and LCX   - pain left chest, pressure pain. Can occur usually at rest, most commonly with laying down. Can be diaphoretic. Pain last a few minutes. Can be positional at times. SImilar to her chronic pain.  - DOE with walking to mailbox x 1 year. +coughing/ sweezing.   CAD risk factors: hyperlipidemia, HTN, +tobacco, both parents MIs in 44 to 63s, brother MI in his 34s  Jan 2020 nuclear stress no ischemia   - no recent chest pain.    2. COPD -  followed by pulmonary  3. HTN - compliant with meds     The patient does not have symptoms concerning for COVID-19 infection (fever, chills, cough, or new shortness of breath).    Past Medical History:  Diagnosis Date  . Allergy   . Arthritis   . Constipation    uses OTC meds with help  . COPD (chronic obstructive pulmonary disease) ([place])   . Coronary artery disease    minimal nonobstructive   . Depression   . GERD (gastroesophageal reflux disease)   . Hx of adenomatous polyp of colon 10/03/2014  . Hypercholesteremia   . Hypertension   . Panic attacks   . Perimenopausal   . Skin cancer   . Tobacco user    Past Surgical History:  Procedure Laterality Date  . ABDOMINAL HYSTERECTOMY    . COLONOSCOPY    . Right foot toe surgery    . UPPER GASTROINTESTINAL ENDOSCOPY    . vocal cord     polyp removal     No outpatient medications have been marked as taking for the 10/13/18 encounter (Appointment) with Kerry Lenis, MD.     Allergies:   Lorcet 10-650 [hydrocodone-acetaminophen], Sulfa antibiotics, and Tape   Social History   Tobacco Use  . Smoking status: Current Every Day Smoker    Packs/day: 0.50    Years: 40.00    Pack years: 20.00    Types: Cigarettes    Start date: 05/01/1969  . Smokeless tobacco: Never Used  . Tobacco  comment: would like information. has tried chantix and wellbutrin  Substance Use Topics  . Alcohol use: No    Alcohol/week: 0.0 standard drinks    Comment: Per pt no 04-02-15.  . Drug use: No    Comment: per pt, Cocaine was in her system October and November 2016 when she went to the pain clinic 04-02-15.     Family Hx: The patient's family history includes Alcohol abuse in her brother; Anxiety disorder in her brother and sister; Colon cancer (age of onset: 95) in her brother; Colon polyps in her brother, brother, sister, and sister; Depression in her brother and sister; Drug abuse in her son; Esophageal cancer in her sister; Heart attack  in her brother, father, and mother; Heart disease in her mother. There is no history of Rectal cancer or Stomach cancer.  ROS:   Please see the history of present illness.     All other systems reviewed and are negative.   Prior CV studies:   The following studies were reviewed today:  06/2006 cath FINDINGS: Aortic pressure 125/78 with a mean of 99. Left ventricular pressure 125/5 with an end-diastolic pressure of 10.  The left mainstem is angiographically normal. It bifurcates into the LAD and left circumflex.  The LAD is a large-caliber vessel that courses down to the LV apex. It gives off a first diagonal [name] and is a large vessel. There is no significant disease in the LAD or diagonal system.  Left circumflex is a large-caliber vessel. There is a small intermediate [name] present. There are really no substantial obtuse marginal branches from the left circumflex present. The left circumflex courses down the AV groove and gives off a large posterolateral [name]. The mid portion of the left circumflex has a long segment of 30-40% stenosis.  The right coronary artery is a large-caliber vessel. It terminates distally in a PDA and posterior AV segment which gives off two posterolateral branches. The mid portion of the right coronary artery provides a small RV marginal [name], and the proximal portion has a small conus [name] present. There are minor luminal irregularities in the proximal portion of the right coronary artery, but there is no significant angiographic disease.  Left ventriculography performed in the 30-degree RAO projection demonstrates normal left ventricular systolic function with an LVEF of 55%. There is no mitral regurgitation.  ASSESSMENT: 1. Nonobstructive coronary artery disease, predominantly involving the left circumflex system. 2. Normal left ventricular function. 3. Intracoronary vascular ultrasound  demonstration of minimal plaque in the right coronary artery.  PLAN: [name] will require aggressive medical therapy. She needs tobacco cessation and progressive statin therapy. She will receive her statin therapy through the [facility] protocol. She should be on a daily aspirin as well. She will be a candidate for early discharge as her chest pain appears to have been noncardiac, and she has had no further pain.   Jan 2020 nuclear stress  There was no ST segment deviation noted during stress.  Defect 1: There is a medium defect of mild severity present in the basal inferior, mid inferior and apical inferior location. This is likely due to soft tissue attenuation. No evidence of ischemia.  This is a low risk study.  Nuclear stress EF: 58%.    Labs/Other Tests and Data Reviewed:    EKG:  No ECG reviewed.  Recent Labs: 07/10/2018: BUN 11; Creatinine, Ser 0.75; Hemoglobin 14.2; Platelets 393; Potassium 3.4; Sodium 140   Recent Lipid Panel Lab Results  Component Value Date/Time  CHOL 165 05/25/2017 11:30 AM   CHOL 187 08/30/2012 01:17 PM   TRIG 90 05/25/2017 11:30 AM   TRIG 149 11/15/2012 11:57 AM   TRIG 202 (H) 08/30/2012 01:17 PM   HDL 51 05/25/2017 11:30 AM   HDL 50 11/15/2012 11:57 AM   HDL 41 08/30/2012 01:17 PM   CHOLHDL 3.2 05/25/2017 11:30 AM   CHOLHDL 3.3 07/13/2013 02:43 AM   LDLCALC 96 05/25/2017 11:30 AM   LDLCALC 81 11/15/2012 11:57 AM   LDLCALC 106 (H) 08/30/2012 01:17 PM    Wt Readings from Last 3 Encounters:  07/10/18 150 lb (68 kg)  06/21/18 142 lb (64.4 kg)  05/27/18 140 lb 3.2 oz (63.6 kg)     Objective:    Vital Signs:   Vitals:   10/13/18 1331  BP: 104/77  Pulse: 81   Normal affect. Normal speech pattern and tone. Comfortable, no apparent distress. No audible signs of SOB or wheezing.   ASSESSMENT & PLAN:    1. CADAtypical chest pain - long history of atypical chest pain - nonobstructive CAD by cath 2008, negative  nuclear stress 2015 and more recently Jan 2020 - recent lung cancer screen CT shows LM disease along with LAD and LCX disease   No recent significant symptoms. Would not consider repeat ischemic testing unless significant change in prior symptoms. Continue to monitor  2. HTN - at goal, continue current meds    F/u 6 months  COVID-19 Education: The signs and symptoms of COVID-19 were discussed with the patient and how to seek care for testing (follow up with PCP or arrange E-visit).  The importance of social distancing was discussed today.  Time:   Today, I have spent 16 minutes with the patient with telehealth technology discussing the above problems.     Medication Adjustments/Labs and Tests Ordered: Current medicines are reviewed at length with the patient today.  Concerns regarding medicines are outlined above.   Tests Ordered: No orders of the defined types were placed in this encounter.   Medication Changes: No orders of the defined types were placed in this encounter.   Follow Up:  In Person in 6 month(s)  Signed, Kerry Dolly, MD  10/13/2018 12:41 PM    [place]

## 2018-10-13 NOTE — Patient Instructions (Signed)

## 2018-10-25 DIAGNOSIS — M6281 Muscle weakness (generalized): Secondary | ICD-10-CM | POA: Diagnosis not present

## 2018-10-30 ENCOUNTER — Other Ambulatory Visit (HOSPITAL_COMMUNITY): Payer: Self-pay | Admitting: Psychiatry

## 2018-10-30 DIAGNOSIS — F332 Major depressive disorder, recurrent severe without psychotic features: Secondary | ICD-10-CM

## 2018-11-01 NOTE — Telephone Encounter (Signed)
Erroneous encounter

## 2018-11-02 DIAGNOSIS — J449 Chronic obstructive pulmonary disease, unspecified: Secondary | ICD-10-CM | POA: Diagnosis not present

## 2018-11-10 ENCOUNTER — Other Ambulatory Visit: Payer: Self-pay

## 2018-11-10 ENCOUNTER — Ambulatory Visit (INDEPENDENT_AMBULATORY_CARE_PROVIDER_SITE_OTHER): Payer: Medicare Other | Admitting: Psychiatry

## 2018-11-10 ENCOUNTER — Encounter (HOSPITAL_COMMUNITY): Payer: Self-pay | Admitting: Psychiatry

## 2018-11-10 DIAGNOSIS — F332 Major depressive disorder, recurrent severe without psychotic features: Secondary | ICD-10-CM

## 2018-11-10 DIAGNOSIS — F411 Generalized anxiety disorder: Secondary | ICD-10-CM | POA: Diagnosis not present

## 2018-11-10 MED ORDER — DULOXETINE HCL 60 MG PO CPEP: 60.0000 mg | ORAL_CAPSULE | Freq: Two times a day (BID) | ORAL | 2 refills | Status: DC

## 2018-11-10 MED ORDER — CLONAZEPAM 1 MG PO TABS: 1.0000 mg | ORAL_TABLET | Freq: Three times a day (TID) | ORAL | 2 refills | Status: DC | PRN

## 2018-11-10 NOTE — Progress Notes (Signed)
Virtual Visit via Telephone Note  I connected with [name] on 11/10/18 at  2:00 PM EDT by telephone and verified that I am speaking with the correct person using two identifiers.   I discussed the limitations, risks, security and privacy concerns of performing an evaluation and management service by telephone and the availability of in person appointments. I also discussed with the patient that there may be a patient responsible charge related to this service. The patient expressed understanding and agreed to proceed.      I discussed the assessment and treatment plan with the patient. The patient was provided an opportunity to ask questions and all were answered. The patient agreed with the plan and demonstrated an understanding of the instructions.   The patient was advised to call back or seek an in-person evaluation if the symptoms worsen or if the condition fails to improve as anticipated.  I provided 15 minutes of non-face-to-face time during this encounter.   [name], MD  [facility] MD/PA/NP OP Progress Note  11/10/2018 2:06 PM [name]  MRN:  [id number]  Chief Complaint:  Chief Complaint    Depression; Anxiety; Follow-up     HPI: This patient is a 63 year old widowed white female who lives alone in [place].  She is on disability.  The patient returns after 3 months for follow-up regarding depression and anxiety.  She states that she is doing about the same.  Her older son has moved in with her temporarily because he separated from his wife and she enjoys having the company.  Unfortunately her trailer is getting foreclosed upon and she is going to have to move.  She is looking for low income housing.  Her son who lives next door continues to use heroin but perhaps not as much as before but he is constantly asking her for financial help.  She is hoping she can move a distance away so she will be harassed so much.  Overall she states that her mood is stable she is sleeping  fairly well but she is staying to herself most of the time.  She denies severe depression suicidal ideation or uncontrolled anxiety. Visit Diagnosis:    ICD-10-CM   1. GAD (generalized anxiety disorder)  F41.1 clonazePAM (KLONOPIN) 1 MG tablet  2. Severe episode of recurrent major depressive disorder, without psychotic features ([place])  F33.2 DULoxetine (CYMBALTA) 60 MG capsule    Past Psychiatric History: none  Past Medical History:  Past Medical History:  Diagnosis Date  . Allergy   . Arthritis   . Constipation    uses OTC meds with help  . COPD (chronic obstructive pulmonary disease) ([place])   . Coronary artery disease    minimal nonobstructive   . Depression   . GERD (gastroesophageal reflux disease)   . Hx of adenomatous polyp of colon 10/03/2014  . Hypercholesteremia   . Hypertension   . Panic attacks   . Perimenopausal   . Skin cancer   . Tobacco user     Past Surgical History:  Procedure Laterality Date  . ABDOMINAL HYSTERECTOMY    . COLONOSCOPY    . Right foot toe surgery    . UPPER GASTROINTESTINAL ENDOSCOPY    . vocal cord     polyp removal    Family Psychiatric History: see below  Family History:  Family History  Problem Relation Age of Onset  . Heart disease Mother   . Heart attack Mother   . Heart attack Father   .  Heart attack Brother   . Colon cancer Brother 16       died at age 62  . Anxiety disorder Brother   . Depression Brother   . Alcohol abuse Brother   . Colon polyps Brother   . Colon polyps Brother   . Colon polyps Sister   . Esophageal cancer Sister   . Anxiety disorder Sister   . Depression Sister   . Colon polyps Sister   . Drug abuse Son   . Rectal cancer Neg Hx   . Stomach cancer Neg Hx     Social History:  Social History   Socioeconomic History  . Marital status: Single    Spouse name: Not on file  . Number of children: Not on file  . Years of education: Not on file  . Highest education level: Not on file  Occupational  History  . 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 Financial resource strain: Not on file  . Food insecurity    Worry: Not on file    Inability: Not on file  . Transportation needs    Medical: Not on file    Non-medical: Not on file  Tobacco Use  . Smoking status: Current Every Day Smoker    Packs/day: 0.50    Years: 40.00    Pack years: 20.00    Types: Cigarettes    Start date: 05/01/1969  . Smokeless tobacco: Never Used  . Tobacco comment: would like information. has tried chantix and wellbutrin  Substance and Sexual Activity  . Alcohol use: No    Alcohol/week: 0.0 standard drinks    Comment: Per pt no 04-02-15.  . Drug use: No    Comment: per pt, Cocaine was in her system October and November 2016 when she went to the pain clinic 04-02-15.  Marland Kitchen Sexual activity: Never  Lifestyle  . Physical activity    Days per week: Not on file    Minutes per session: Not on file  . Stress: Not on file  Relationships  . Social Herbalist on phone: Not on file    Gets together: Not on file    Attends religious service: Not on file    Active member of club or organization: Not on file    Attends meetings of clubs or organizations: Not on file    Relationship status: Not on file  Other Topics Concern  . Not on file  Social History Narrative   Lives in [place] with husband   Under a lot of family stress   Goes to [facility].          Allergies:  Allergies  Allergen Reactions  . Lorcet 10-650 [Hydrocodone-Acetaminophen] Itching  . Sulfa Antibiotics Other (See Comments)    aching  . Tape Other (See Comments)    Please use paper tape    Metabolic Disorder Labs: Lab Results  Component Value Date   HGBA1C 5.9 (H) 07/12/2013   MPG 123 (H) 07/12/2013   No results found for: PROLACTIN Lab Results  Component Value Date   CHOL 165 05/25/2017   TRIG 90 05/25/2017   HDL 51 05/25/2017   CHOLHDL 3.2 05/25/2017    VLDL 36 07/13/2013   LDLCALC 96 05/25/2017   LDLCALC 65 09/01/2016   Lab Results  Component Value Date   TSH 1.820 05/25/2017   TSH 1.080 05/17/2015    Therapeutic Level Labs: No  results found for: LITHIUM No results found for: VALPROATE No components found for:  CBMZ  Current Medications: Current Outpatient Medications  Medication Sig Dispense Refill  . albuterol (VENTOLIN HFA) 108 (90 Base) MCG/ACT inhaler Inhale 2 puffs into the lungs every 4 (four) hours as needed for wheezing or shortness of breath. 3 Inhaler 3  . amLODipine (NORVASC) 5 MG tablet Take 1 tablet (5 mg total) by mouth daily. 90 tablet 1  . Ascorbic Acid (VITAMIN C) 1000 MG tablet Take 1,000 mg by mouth daily.    Marland Kitchen aspirin EC 81 MG tablet Take 81 mg by mouth daily.    Marland Kitchen atorvastatin (LIPITOR) 40 MG tablet TAKE 1 TABLET BY MOUTH AT  BEDTIME 90 tablet 0  . budesonide-formoterol (SYMBICORT) 160-4.5 MCG/ACT inhaler INHALE TWO PUFFS INTO THE LUNGS TWICE DAILY (Patient taking differently: Inhale 2 puffs into the lungs 2 (two) times a day. INHALE TWO PUFFS INTO THE LUNGS TWICE DAILY) 30.6 g 5  . clonazePAM (KLONOPIN) 1 MG tablet Take 1 tablet (1 mg total) by mouth 3 (three) times daily as needed for anxiety. 90 tablet 2  . cloNIDine (CATAPRES) 0.1 MG tablet Take 1 tablet (0.1 mg total) by mouth at bedtime. 30 tablet 5  . DULoxetine (CYMBALTA) 60 MG capsule Take 1 capsule (60 mg total) by mouth 2 (two) times daily. 60 capsule 2  . fish oil-omega-3 fatty acids 1000 MG capsule Take 1 g by mouth daily.     Marland Kitchen gabapentin (NEURONTIN) 300 MG capsule Take 300-600 mg by mouth See admin instructions. Take 1 capsule in the morning and 2 capsule at bedtime    . ipratropium-albuterol (DUONEB) 0.5-2.5 (3) MG/3ML SOLN Take 3 mLs by nebulization every 6 (six) hours as needed. 360 mL 5  . lisinopril (ZESTRIL) 20 MG tablet TAKE 1 TABLET BY MOUTH  DAILY 90 tablet 0  . meloxicam (MOBIC) 15 MG tablet Take 15 mg by mouth daily.  3  . Oxycodone  HCl 10 MG TABS Take 10 mg by mouth every 6 (six) hours as needed (pain).   0  . pantoprazole (PROTONIX) 40 MG tablet TAKE 1 TABLET BY MOUTH  TWICE DAILY 180 tablet 0  . Tiotropium Bromide Monohydrate (SPIRIVA RESPIMAT) 2.5 MCG/ACT AERS Inhale 2 puffs into the lungs daily. 4 g 5   No current facility-administered medications for this visit.      Musculoskeletal: Strength & Muscle Tone: within normal limits Gait & Station: normal Patient leans: N/A  Psychiatric Specialty Exam: Review of Systems  Respiratory: Positive for shortness of breath.   All other systems reviewed and are negative.   There were no vitals taken for this visit.There is no height or weight on file to calculate BMI.  General Appearance: NA  Eye Contact:  NA  Speech:  Clear and Coherent  Volume:  Decreased  Mood:  Anxious  Affect:  NA  Thought Process:  Goal Directed  Orientation:  Full (Time, Place, and Person)  Thought Content: Rumination   Suicidal Thoughts:  No  Homicidal Thoughts:  No  Memory:  Immediate;   Good Recent;   Good Remote;   Fair  Judgement:  Fair  Insight:  Fair  Psychomotor Activity:  Decreased  Concentration:  Concentration: Fair and Attention Span: Fair  Recall:  Good  Fund of Knowledge: Fair  Language: Good  Akathisia:  No  Handed:  Right  AIMS (if indicated): not done  Assets:  Communication Skills Desire for Improvement Resilience Social Support Talents/Skills  ADL's:  Intact  Cognition: WNL  Sleep:  Good   Screenings: GAD-7     Counselor from 11/20/2015 in [place] Office Visit from 02/09/2015 in [place]  Total GAD-7 Score  9  21    PHQ2-9     Office Visit from 07/15/2018 in [place] Visit from 07/08/2018 in [place] Office Visit from 06/21/2018 in [place] Office Visit from 04/28/2018 in [place] Office Visit from 04/13/2018 in [place]  PHQ-2 Total Score  0  0  3  0  0  PHQ-9 Total Score  -  -  7  -  -       Assessment and Plan: This patient is a 63 year old female with a history of depression and anxiety.  Despite recent stressors like having to move she seems to be stable.  She will continue Cymbalta 60 mg twice daily for depression and clonazepam 1 mg 3 times daily for anxiety.  She will return to see me in 3 months.   [name], MD 11/10/2018, 2:06 PM

## 2018-11-16 ENCOUNTER — Other Ambulatory Visit: Payer: Self-pay

## 2018-11-17 ENCOUNTER — Encounter: Payer: Medicare Other | Admitting: Family Medicine

## 2018-11-23 ENCOUNTER — Other Ambulatory Visit: Payer: Self-pay | Admitting: Dermatology

## 2018-11-23 DIAGNOSIS — D229 Melanocytic nevi, unspecified: Secondary | ICD-10-CM | POA: Diagnosis not present

## 2018-11-23 DIAGNOSIS — L570 Actinic keratosis: Secondary | ICD-10-CM | POA: Diagnosis not present

## 2018-11-23 DIAGNOSIS — D692 Other nonthrombocytopenic purpura: Secondary | ICD-10-CM | POA: Diagnosis not present

## 2018-11-23 DIAGNOSIS — D0471 Carcinoma in situ of skin of right lower limb, including hip: Secondary | ICD-10-CM | POA: Diagnosis not present

## 2018-11-25 DIAGNOSIS — G43909 Migraine, unspecified, not intractable, without status migrainosus: Secondary | ICD-10-CM | POA: Diagnosis not present

## 2018-11-25 DIAGNOSIS — M542 Cervicalgia: Secondary | ICD-10-CM | POA: Diagnosis not present

## 2018-11-25 DIAGNOSIS — G5603 Carpal tunnel syndrome, bilateral upper limbs: Secondary | ICD-10-CM | POA: Diagnosis not present

## 2018-11-30 DIAGNOSIS — M6281 Muscle weakness (generalized): Secondary | ICD-10-CM | POA: Diagnosis not present

## 2018-12-02 DIAGNOSIS — J449 Chronic obstructive pulmonary disease, unspecified: Secondary | ICD-10-CM | POA: Diagnosis not present

## 2018-12-03 ENCOUNTER — Other Ambulatory Visit: Payer: Self-pay

## 2018-12-06 ENCOUNTER — Encounter: Payer: Medicare Other | Admitting: Family Medicine

## 2018-12-06 ENCOUNTER — Other Ambulatory Visit: Payer: Self-pay | Admitting: Family Medicine

## 2018-12-06 DIAGNOSIS — K219 Gastro-esophageal reflux disease without esophagitis: Secondary | ICD-10-CM

## 2018-12-06 DIAGNOSIS — E782 Mixed hyperlipidemia: Secondary | ICD-10-CM

## 2018-12-06 DIAGNOSIS — I10 Essential (primary) hypertension: Secondary | ICD-10-CM

## 2018-12-09 ENCOUNTER — Other Ambulatory Visit: Payer: Self-pay

## 2018-12-10 ENCOUNTER — Ambulatory Visit (INDEPENDENT_AMBULATORY_CARE_PROVIDER_SITE_OTHER): Payer: Medicare Other | Admitting: Family Medicine

## 2018-12-10 ENCOUNTER — Encounter: Payer: Self-pay | Admitting: Family Medicine

## 2018-12-10 VITALS — BP 129/76 | HR 68 | Temp 97.3°F | Resp 20 | Ht 65.0 in | Wt 148.0 lb

## 2018-12-10 DIAGNOSIS — Z23 Encounter for immunization: Secondary | ICD-10-CM

## 2018-12-10 DIAGNOSIS — N281 Cyst of kidney, acquired: Secondary | ICD-10-CM

## 2018-12-10 DIAGNOSIS — M5126 Other intervertebral disc displacement, lumbar region: Secondary | ICD-10-CM | POA: Diagnosis not present

## 2018-12-10 NOTE — Progress Notes (Signed)
Chief Complaint  Patient presents with  . Discuss MRI    HPI  Patient presents today for concern for her kidneys as she read on MRI that she had cysts. Also still having daily 6/10 lumbar pain radiating to the left thigh. Doesn't want to see a surgeon for the disc herniation (see scanned report) because she doesn't want surgery.   PMH: Smoking status noted ROS: Per HPI  Objective: BP 129/76   Pulse 68   Temp (!) 97.3 F (36.3 C) (Temporal)   Resp 20   Ht 5\' 5"  (1.651 m)   Wt 148 lb (67.1 kg)   SpO2 97%   BMI 24.63 kg/m  Gen: NAD, alert, cooperative with exam  Assessment and plan:  1. Lumbar herniated disc   2. Renal cyst    MRI Report reviewed with pt.: Pt. Was reassured that the cysts appear incidental, have no pathologic significance.She will discuss implantable spinal stimulator with her pain specialist, Dr. Trula Ore.  Referral deferred at pt request.  Follow up as needed.  Claretta Fraise, MD

## 2018-12-30 ENCOUNTER — Other Ambulatory Visit: Payer: Self-pay | Admitting: Family Medicine

## 2018-12-30 DIAGNOSIS — M6281 Muscle weakness (generalized): Secondary | ICD-10-CM | POA: Diagnosis not present

## 2019-01-02 DIAGNOSIS — J449 Chronic obstructive pulmonary disease, unspecified: Secondary | ICD-10-CM | POA: Diagnosis not present

## 2019-01-17 DIAGNOSIS — D0471 Carcinoma in situ of skin of right lower limb, including hip: Secondary | ICD-10-CM | POA: Diagnosis not present

## 2019-01-20 DIAGNOSIS — R202 Paresthesia of skin: Secondary | ICD-10-CM | POA: Diagnosis not present

## 2019-01-20 DIAGNOSIS — G43909 Migraine, unspecified, not intractable, without status migrainosus: Secondary | ICD-10-CM | POA: Diagnosis not present

## 2019-01-20 DIAGNOSIS — Z79899 Other long term (current) drug therapy: Secondary | ICD-10-CM | POA: Diagnosis not present

## 2019-01-20 DIAGNOSIS — M542 Cervicalgia: Secondary | ICD-10-CM | POA: Diagnosis not present

## 2019-01-24 ENCOUNTER — Other Ambulatory Visit: Payer: Self-pay | Admitting: Family Medicine

## 2019-01-31 ENCOUNTER — Other Ambulatory Visit (HOSPITAL_COMMUNITY): Payer: Self-pay | Admitting: Psychiatry

## 2019-01-31 DIAGNOSIS — F411 Generalized anxiety disorder: Secondary | ICD-10-CM

## 2019-01-31 DIAGNOSIS — M6281 Muscle weakness (generalized): Secondary | ICD-10-CM | POA: Diagnosis not present

## 2019-02-01 DIAGNOSIS — J449 Chronic obstructive pulmonary disease, unspecified: Secondary | ICD-10-CM | POA: Diagnosis not present

## 2019-02-09 ENCOUNTER — Ambulatory Visit (INDEPENDENT_AMBULATORY_CARE_PROVIDER_SITE_OTHER): Payer: Medicare Other | Admitting: Psychiatry

## 2019-02-09 ENCOUNTER — Other Ambulatory Visit: Payer: Self-pay

## 2019-02-09 ENCOUNTER — Encounter (HOSPITAL_COMMUNITY): Payer: Self-pay | Admitting: Psychiatry

## 2019-02-09 DIAGNOSIS — F332 Major depressive disorder, recurrent severe without psychotic features: Secondary | ICD-10-CM | POA: Diagnosis not present

## 2019-02-09 DIAGNOSIS — F411 Generalized anxiety disorder: Secondary | ICD-10-CM | POA: Diagnosis not present

## 2019-02-09 MED ORDER — DULOXETINE HCL 60 MG PO CPEP: 60.0000 mg | ORAL_CAPSULE | Freq: Two times a day (BID) | ORAL | 2 refills | Status: DC

## 2019-02-09 MED ORDER — CLONAZEPAM 1 MG PO TABS: 1.0000 mg | ORAL_TABLET | Freq: Three times a day (TID) | ORAL | 2 refills | Status: DC | PRN

## 2019-02-09 NOTE — Progress Notes (Signed)
Virtual Visit via Telephone Note  I connected with [name] on 02/09/19 at  2:00 PM EST by telephone and verified that I am speaking with the correct person using two identifiers.   I discussed the limitations, risks, security and privacy concerns of performing an evaluation and management service by telephone and the availability of in person appointments. I also discussed with the patient that there may be a patient responsible charge related to this service. The patient expressed understanding and agreed to proceed.     I discussed the assessment and treatment plan with the patient. The patient was provided an opportunity to ask questions and all were answered. The patient agreed with the plan and demonstrated an understanding of the instructions.   The patient was advised to call back or seek an in-person evaluation if the symptoms worsen or if the condition fails to improve as anticipated.  I provided 15 minutes of non-face-to-face time during this encounter.   [name], MD  [facility] MD/PA/NP OP Progress Note  02/09/2019 2:12 PM [name]  MRN:  [id number]  Chief Complaint:  Chief Complaint    Anxiety; Depression; Follow-up     HPI: This patient is a 63 year old widowed white female who lives alone in [place].  She is on disability.  The patient returns for follow-up regarding her depression and anxiety.  She was last seen 3 months ago.  The patient states in general she is doing okay.  Her son who lives next door who was using drugs and harassing her is now on probation and he has had to be compliant with drug testing.  He is off drugs and alcohol and has been much kinder to her.  Unfortunately, her other son is now hooked on pain pills and refuses to get treatment.  He is left his wife and teenage son.  She is very worried about this and she states that his wife continues to call her and tried to get her to help her son but there is nothing more she can say or do.  Overall  however she states that her mood is stable and the medications are helping her depression and anxiety.  She is sleeping well.  She still has breathing issues due to COPD and right now has a bad cold.  I urged her to call her primary provider about this Visit Diagnosis:    ICD-10-CM   1. GAD (generalized anxiety disorder)  F41.1 clonazePAM (KLONOPIN) 1 MG tablet  2. Severe episode of recurrent major depressive disorder, without psychotic features ([place])  F33.2 DULoxetine (CYMBALTA) 60 MG capsule    Past Psychiatric History: none  Past Medical History:  Past Medical History:  Diagnosis Date  . Allergy   . Arthritis   . Constipation    uses OTC meds with help  . COPD (chronic obstructive pulmonary disease) ([place])   . Coronary artery disease    minimal nonobstructive   . Depression   . GERD (gastroesophageal reflux disease)   . Hx of adenomatous polyp of colon 10/03/2014  . Hypercholesteremia   . Hypertension   . Panic attacks   . Perimenopausal   . Skin cancer   . Tobacco user     Past Surgical History:  Procedure Laterality Date  . ABDOMINAL HYSTERECTOMY    . COLONOSCOPY    . Right foot toe surgery    . UPPER GASTROINTESTINAL ENDOSCOPY    . vocal cord     polyp removal    Family  Psychiatric History: see below  Family History:  Family History  Problem Relation Age of Onset  . Heart disease Mother   . Heart attack Mother   . Heart attack Father   . Heart attack Brother   . Colon cancer Brother 74       died at age 87  . Anxiety disorder Brother   . Depression Brother   . Alcohol abuse Brother   . Colon polyps Brother   . Colon polyps Brother   . Colon polyps Sister   . Esophageal cancer Sister   . Anxiety disorder Sister   . Depression Sister   . Colon polyps Sister   . Drug abuse Son   . Rectal cancer Neg Hx   . Stomach cancer Neg Hx     Social History:  Social History   Socioeconomic History  . Marital status: Single    Spouse name: Not on file  .  Number of children: Not on file  . Years of education: Not on file  . Highest education level: Not on file  Occupational History  . 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 Financial resource strain: Not on file  . Food insecurity    Worry: Not on file    Inability: Not on file  . Transportation needs    Medical: Not on file    Non-medical: Not on file  Tobacco Use  . Smoking status: Current Every Day Smoker    Packs/day: 0.50    Years: 40.00    Pack years: 20.00    Types: Cigarettes    Start date: 05/01/1969  . Smokeless tobacco: Never Used  . Tobacco comment: would like information. has tried chantix and wellbutrin  Substance and Sexual Activity  . Alcohol use: No    Alcohol/week: 0.0 standard drinks    Comment: Per pt no 04-02-15.  . Drug use: No    Comment: per pt, Cocaine was in her system October and November 2016 when she went to the pain clinic 04-02-15.  Marland Kitchen Sexual activity: Never  Lifestyle  . Physical activity    Days per week: Not on file    Minutes per session: Not on file  . Stress: Not on file  Relationships  . Social Herbalist on phone: Not on file    Gets together: Not on file    Attends religious service: Not on file    Active member of club or organization: Not on file    Attends meetings of clubs or organizations: Not on file    Relationship status: Not on file  Other Topics Concern  . Not on file  Social History Narrative   Lives in [place] with husband   Under a lot of family stress   Goes to [facility] Care.          Allergies:  Allergies  Allergen Reactions  . Lorcet 10-650 [Hydrocodone-Acetaminophen] Itching  . Sulfa Antibiotics Other (See Comments)    aching  . Tape Other (See Comments)    Please use paper tape    Metabolic Disorder Labs: Lab Results  Component Value Date   HGBA1C 5.9 (H) 07/12/2013   MPG 123 (H) 07/12/2013   No results found for: PROLACTIN Lab  Results  Component Value Date   CHOL 165 05/25/2017   TRIG 90 05/25/2017   HDL 51 05/25/2017   CHOLHDL 3.2 05/25/2017   VLDL 36  07/13/2013   LDLCALC 96 05/25/2017   LDLCALC 65 09/01/2016   Lab Results  Component Value Date   TSH 1.820 05/25/2017   TSH 1.080 05/17/2015    Therapeutic Level Labs: No results found for: LITHIUM No results found for: VALPROATE No components found for:  CBMZ  Current Medications: Current Outpatient Medications  Medication Sig Dispense Refill  . albuterol (VENTOLIN HFA) 108 (90 Base) MCG/ACT inhaler Inhale 2 puffs into the lungs every 4 (four) hours as needed for wheezing or shortness of breath. 3 Inhaler 3  . amLODipine (NORVASC) 5 MG tablet TAKE 1 TABLET BY MOUTH  DAILY 90 tablet 0  . Ascorbic Acid (VITAMIN C) 1000 MG tablet Take 1,000 mg by mouth daily.    Marland Kitchen aspirin EC 81 MG tablet Take 81 mg by mouth daily.    Marland Kitchen atorvastatin (LIPITOR) 40 MG tablet TAKE 1 TABLET BY MOUTH AT  BEDTIME 90 tablet 0  . budesonide-formoterol (SYMBICORT) 160-4.5 MCG/ACT inhaler INHALE TWO PUFFS INTO THE LUNGS TWICE DAILY (Patient taking differently: Inhale 2 puffs into the lungs 2 (two) times a day. INHALE TWO PUFFS INTO THE LUNGS TWICE DAILY) 30.6 g 5  . clonazePAM (KLONOPIN) 1 MG tablet Take 1 tablet (1 mg total) by mouth 3 (three) times daily as needed. for anxiety 90 tablet 2  . cloNIDine (CATAPRES) 0.1 MG tablet TAKE ONE TABLET BY MOUTH AT BEDTIME. 30 tablet 5  . DULoxetine (CYMBALTA) 60 MG capsule Take 1 capsule (60 mg total) by mouth 2 (two) times daily. 60 capsule 2  . fish oil-omega-3 fatty acids 1000 MG capsule Take 1 g by mouth daily.     Marland Kitchen gabapentin (NEURONTIN) 300 MG capsule Take 300-600 mg by mouth See admin instructions. Take 1 capsule in the morning and 2 capsule at bedtime    . ipratropium-albuterol (DUONEB) 0.5-2.5 (3) MG/3ML SOLN Take 3 mLs by nebulization every 6 (six) hours as needed. 360 mL 5  . lisinopril (ZESTRIL) 20 MG tablet TAKE 1 TABLET BY MOUTH   DAILY 90 tablet 0  . meloxicam (MOBIC) 15 MG tablet Take 15 mg by mouth daily.  3  . Oxycodone HCl 10 MG TABS Take 10 mg by mouth every 6 (six) hours as needed (pain).   0  . pantoprazole (PROTONIX) 40 MG tablet TAKE 1 TABLET BY MOUTH  TWICE DAILY 180 tablet 0  . SPIRIVA RESPIMAT 2.5 MCG/ACT AERS INHALE TWO PUFFS INTO THE LUNGS DAILY. 4 g 0   No current facility-administered medications for this visit.      Musculoskeletal: Strength & Muscle Tone: within normal limits Gait & Station: normal Patient leans: N/A  Psychiatric Specialty Exam: Review of Systems  HENT: Positive for congestion.   Respiratory: Positive for shortness of breath.   Musculoskeletal: Positive for back pain.  All other systems reviewed and are negative.   There were no vitals taken for this visit.There is no height or weight on file to calculate BMI.  General Appearance: NA  Eye Contact:  NA  Speech:  Clear and Coherent  Volume:  Normal  Mood:  Anxious  Affect:  NA  Thought Process:  Goal Directed  Orientation:  Full (Time, Place, and Person)  Thought Content: Rumination   Suicidal Thoughts:  No  Homicidal Thoughts:  No  Memory:  Immediate;   Good Recent;   Good Remote;   Good  Judgement:  Good  Insight:  Fair  Psychomotor Activity:  Decreased  Concentration:  Concentration: Good and Attention Span: Good  Recall:  Roel Cluck of Knowledge: Fair  Language: Good  Akathisia:  No  Handed:  Right  AIMS (if indicated): not done  Assets:  Communication Skills Desire for Improvement Physical Health Resilience Social Support Talents/Skills  ADL's:  Intact  Cognition: WNL  Sleep:  Good   Screenings: GAD-7     Counselor from 11/20/2015 in [place] Office Visit from 02/09/2015 in [place]  Total GAD-7 Score  9  21    PHQ2-9     Office Visit from 07/15/2018 in [place] Office Visit from 07/08/2018 in [place] Office Visit from 06/21/2018 in [place] Office Visit from 04/28/2018 in [place] Office Visit from 04/13/2018 in [place]  PHQ-2 Total Score  0  0  3  0  0  PHQ-9 Total Score  -  -  7  -  -       Assessment and Plan: This patient is a 63 year old female with a history of depression and anxiety.  It seems like her sons always have a problem with drug abuse.  She is trying to take this in stride and doing with the most that she can for them given that they are adults.  She states that her medications are still helpful so she will continue Cymbalta 60 mg twice daily for depression and clonazepam 1 mg 3 times daily for anxiety.  She will return to see me in 3 months   [name], MD 02/09/2019, 2:12 PM

## 2019-02-10 ENCOUNTER — Encounter (HOSPITAL_COMMUNITY): Payer: Self-pay | Admitting: *Deleted

## 2019-02-10 ENCOUNTER — Other Ambulatory Visit: Payer: Self-pay

## 2019-02-10 ENCOUNTER — Ambulatory Visit (INDEPENDENT_AMBULATORY_CARE_PROVIDER_SITE_OTHER): Payer: Medicare Other | Admitting: Family Medicine

## 2019-02-10 ENCOUNTER — Emergency Department (HOSPITAL_COMMUNITY): Payer: Medicare Other

## 2019-02-10 ENCOUNTER — Encounter: Payer: Self-pay | Admitting: Family Medicine

## 2019-02-10 ENCOUNTER — Emergency Department (HOSPITAL_COMMUNITY)
Admission: EM | Admit: 2019-02-10 | Discharge: 2019-02-10 | Disposition: A | Discharge status: Home / Self Care | Payer: Medicare Other | Attending: Emergency Medicine | Admitting: Emergency Medicine

## 2019-02-10 DIAGNOSIS — J069 Acute upper respiratory infection, unspecified: Secondary | ICD-10-CM

## 2019-02-10 DIAGNOSIS — J40 Bronchitis, not specified as acute or chronic: Secondary | ICD-10-CM | POA: Insufficient documentation

## 2019-02-10 DIAGNOSIS — Z20828 Contact with and (suspected) exposure to other viral communicable diseases: Secondary | ICD-10-CM | POA: Diagnosis not present

## 2019-02-10 DIAGNOSIS — J0110 Acute frontal sinusitis, unspecified: Secondary | ICD-10-CM

## 2019-02-10 DIAGNOSIS — R05 Cough: Secondary | ICD-10-CM | POA: Diagnosis not present

## 2019-02-10 DIAGNOSIS — J019 Acute sinusitis, unspecified: Secondary | ICD-10-CM | POA: Diagnosis not present

## 2019-02-10 DIAGNOSIS — F17210 Nicotine dependence, cigarettes, uncomplicated: Secondary | ICD-10-CM | POA: Diagnosis not present

## 2019-02-10 DIAGNOSIS — J449 Chronic obstructive pulmonary disease, unspecified: Secondary | ICD-10-CM | POA: Insufficient documentation

## 2019-02-10 DIAGNOSIS — Z7982 Long term (current) use of aspirin: Secondary | ICD-10-CM | POA: Diagnosis not present

## 2019-02-10 DIAGNOSIS — I10 Essential (primary) hypertension: Secondary | ICD-10-CM | POA: Diagnosis not present

## 2019-02-10 DIAGNOSIS — Z79899 Other long term (current) drug therapy: Secondary | ICD-10-CM | POA: Insufficient documentation

## 2019-02-10 DIAGNOSIS — Z85820 Personal history of malignant melanoma of skin: Secondary | ICD-10-CM | POA: Diagnosis not present

## 2019-02-10 DIAGNOSIS — R059 Cough, unspecified: Secondary | ICD-10-CM

## 2019-02-10 LAB — CBC WITH DIFFERENTIAL/PLATELET
Abs Immature Granulocytes: 0.03 10*3/uL (ref 0.00–0.07)
Basophils Absolute: 0 10*3/uL (ref 0.0–0.1)
Basophils Relative: 0 %
Eosinophils Absolute: 0.3 10*3/uL (ref 0.0–0.5)
Eosinophils Relative: 3 %
HCT: 44.6 % (ref 36.0–46.0)
Hemoglobin: 14.5 g/dL (ref 12.0–15.0)
Immature Granulocytes: 0 %
Lymphocytes Relative: 31 %
Lymphs Abs: 3.2 10*3/uL (ref 0.7–4.0)
MCH: 31.5 pg (ref 26.0–34.0)
MCHC: 32.5 g/dL (ref 30.0–36.0)
MCV: 97 fL (ref 80.0–100.0)
Monocytes Absolute: 0.8 10*3/uL (ref 0.1–1.0)
Monocytes Relative: 7 %
Neutro Abs: 6 10*3/uL (ref 1.7–7.7)
Neutrophils Relative %: 59 %
Platelets: 339 10*3/uL (ref 150–400)
RBC: 4.6 MIL/uL (ref 3.87–5.11)
RDW: 14.6 % (ref 11.5–15.5)
WBC: 10.3 10*3/uL (ref 4.0–10.5)
nRBC: 0 % (ref 0.0–0.2)

## 2019-02-10 LAB — BASIC METABOLIC PANEL
Anion gap: 11 (ref 5–15)
BUN: 15 mg/dL (ref 8–23)
CO2: 26 mmol/L (ref 22–32)
Calcium: 9.4 mg/dL (ref 8.9–10.3)
Chloride: 106 mmol/L (ref 98–111)
Creatinine, Ser: 0.7 mg/dL (ref 0.44–1.00)
GFR calc Af Amer: >60 mL/min (ref >60)
GFR calc non Af Amer: >60 mL/min (ref >60)
Glucose, Bld: 110 mg/dL — ABNORMAL HIGH (ref 70–99)
Potassium: 3.7 mmol/L (ref 3.5–5.1)
Sodium: 143 mmol/L (ref 135–145)

## 2019-02-10 MED ORDER — AMOXICILLIN-POT CLAVULANATE 875-125 MG PO TABS: 1.0000 | ORAL_TABLET | Freq: Two times a day (BID) | ORAL | 0 refills | Status: DC

## 2019-02-10 MED ORDER — PREDNISONE 20 MG PO TABS: 40.0000 mg | ORAL_TABLET | Freq: Every day | ORAL | 0 refills | Status: DC

## 2019-02-10 MED ORDER — BENZONATATE 100 MG PO CAPS: 100.0000 mg | ORAL_CAPSULE | Freq: Three times a day (TID) | ORAL | 0 refills | Status: DC

## 2019-02-10 NOTE — Discharge Instructions (Signed)
Please quarantine until you get the results of your test which could take up to 2 days Take Prednisone for the next 5 days Take Augmentin (antibiotic) for 5 days Continue to use inhaler as needed for wheezing or shortness of breath Take Tessalon for cough

## 2019-02-10 NOTE — ED Triage Notes (Signed)
Patient presents to the ED with cough, congestion, headache for one week. Patient does not report a fever.  Patient was advised by her PCP to have a covid test done. Patient does not know if she was exposed.

## 2019-02-10 NOTE — Progress Notes (Signed)
Virtual Visit via Telephone Note  I connected with [name] on 02/10/19 at 8:14 AM by telephone and verified that I am speaking with the correct person using two identifiers. [name] is currently located at home and nobody is currently with her during this visit. The provider, Loman Brooklyn, FNP is located in their home at time of visit.  I discussed the limitations, risks, security and privacy concerns of performing an evaluation and management service by telephone and the availability of in person appointments. I also discussed with the patient that there may be a patient responsible charge related to this service. The patient expressed understanding and agreed to proceed.  Subjective: PCP: Claretta Fraise, MD  Chief Complaint  Patient presents with  . Cough   Patient complains of a dry cough, head congestion, ear pain/pressure and shortness of breath. Additional symptoms include headache, runny nose, sore throat, fever and loss of taste. Onset of symptoms was 1 week ago, gradually worsening since that time, especially the shortness of breath. She is drinking plenty of fluids. Evaluation to date: none. Treatment to date: duonebs and albuterol inhaler, which only mildly relieve the shortness of breath. She has a history of allergies and COPD. She does smoke.    ROS: Per HPI  Current Outpatient Medications:  .  albuterol (VENTOLIN HFA) 108 (90 Base) MCG/ACT inhaler, Inhale 2 puffs into the lungs every 4 (four) hours as needed for wheezing or shortness of breath., Disp: 3 Inhaler, Rfl: 3 .  amLODipine (NORVASC) 5 MG tablet, TAKE 1 TABLET BY MOUTH  DAILY, Disp: 90 tablet, Rfl: 0 .  Ascorbic Acid (VITAMIN C) 1000 MG tablet, Take 1,000 mg by mouth daily., Disp: , Rfl:  .  aspirin EC 81 MG tablet, Take 81 mg by mouth daily., Disp: , Rfl:  .  atorvastatin (LIPITOR) 40 MG tablet, TAKE 1 TABLET BY MOUTH AT  BEDTIME, Disp: 90 tablet, Rfl: 0 .  budesonide-formoterol (SYMBICORT)  160-4.5 MCG/ACT inhaler, INHALE TWO PUFFS INTO THE LUNGS TWICE DAILY (Patient taking differently: Inhale 2 puffs into the lungs 2 (two) times a day. INHALE TWO PUFFS INTO THE LUNGS TWICE DAILY), Disp: 30.6 g, Rfl: 5 .  clonazePAM (KLONOPIN) 1 MG tablet, Take 1 tablet (1 mg total) by mouth 3 (three) times daily as needed. for anxiety, Disp: 90 tablet, Rfl: 2 .  cloNIDine (CATAPRES) 0.1 MG tablet, TAKE ONE TABLET BY MOUTH AT BEDTIME., Disp: 30 tablet, Rfl: 5 .  DULoxetine (CYMBALTA) 60 MG capsule, Take 1 capsule (60 mg total) by mouth 2 (two) times daily., Disp: 60 capsule, Rfl: 2 .  fish oil-omega-3 fatty acids 1000 MG capsule, Take 1 g by mouth daily. , Disp: , Rfl:  .  gabapentin (NEURONTIN) 300 MG capsule, Take 300-600 mg by mouth See admin instructions. Take 1 capsule in the morning and 2 capsule at bedtime, Disp: , Rfl:  .  ipratropium-albuterol (DUONEB) 0.5-2.5 (3) MG/3ML SOLN, Take 3 mLs by nebulization every 6 (six) hours as needed., Disp: 360 mL, Rfl: 5 .  lisinopril (ZESTRIL) 20 MG tablet, TAKE 1 TABLET BY MOUTH  DAILY, Disp: 90 tablet, Rfl: 0 .  meloxicam (MOBIC) 15 MG tablet, Take 15 mg by mouth daily., Disp: , Rfl: 3 .  Oxycodone HCl 10 MG TABS, Take 10 mg by mouth every 6 (six) hours as needed (pain). , Disp: , Rfl: 0 .  pantoprazole (PROTONIX) 40 MG tablet, TAKE 1 TABLET BY MOUTH  TWICE DAILY, Disp: 180 tablet,  Rfl: 0 .  SPIRIVA RESPIMAT 2.5 MCG/ACT AERS, INHALE TWO PUFFS INTO THE LUNGS DAILY., Disp: 4 g, Rfl: 0  Allergies  Allergen Reactions  . Lorcet 10-650 [Hydrocodone-Acetaminophen] Itching  . Sulfa Antibiotics Other (See Comments)    aching  . Tape Other (See Comments)    Please use paper tape   Past Medical History:  Diagnosis Date  . Allergy   . Arthritis   . Constipation    uses OTC meds with help  . COPD (chronic obstructive pulmonary disease) ([place])   . Coronary artery disease    minimal nonobstructive   . Depression   . GERD (gastroesophageal reflux disease)    . Hx of adenomatous polyp of colon 10/03/2014  . Hypercholesteremia   . Hypertension   . Panic attacks   . Perimenopausal   . Skin cancer   . Tobacco user     Observations/Objective: A&O  Wheezing and shortness of breath audible over the phone. Patient speaking in short sentences. Clearly not doing well. Mood, judgement, and thought processes all WNL  Assessment and Plan: 1. Upper respiratory tract infection, unspecified type - Encouraged patient to go to the emergency room due to her increased shortness of breath and wheezing.  She definitely sounds like she is having a hard time even over the telephone.  She does not wish to go to [facility] or [facility].  Discussed she could certainly go to any of the hospitals in [place].  She is going to get her son to take her out to [place].   Follow Up Instructions:  I discussed the assessment and treatment plan with the patient. The patient was provided an opportunity to ask questions and all were answered. The patient agreed with the plan and demonstrated an understanding of the instructions.   The patient was advised to call back or seek an in-person evaluation if the symptoms worsen or if the condition fails to improve as anticipated.  The above assessment and management plan was discussed with the patient. The patient verbalized understanding of and has agreed to the management plan. Patient is aware to call the clinic if symptoms persist or worsen. Patient is aware when to return to the clinic for a follow-up visit. Patient educated on when it is appropriate to go to the emergency department.   Time call ended: 8:23 AM  I provided 13 minutes of non-face-to-face time during this encounter.  Hendricks Limes, MSN, APRN, FNP-C [facility] 02/10/19

## 2019-02-10 NOTE — ED Provider Notes (Signed)
[facility] EMERGENCY DEPARTMENT Provider Note   CSN: [id number] Arrival date & time: 02/10/19  1548     History Chief Complaint  Patient presents with  . Cough    [name] is a 63 y.o. female with history of COPD who presents with a cough. She states that for about 1 week she has had a frontal headache, sinus pressure, runny nose, sore throat, productive cough that has become dry, shortness of breath and wheezing. She is using Mucinex and her inhalers without significant relief. She is unsure of fever but feels hot at times. She called her doctor and they advised her to come to the ED for a COVID test. She denies chest pain, abdominal pain, N/V/D. Her friend has been sick but has not been tested for COVID.  HPI     Past Medical History:  Diagnosis Date  . Allergy   . Arthritis   . Constipation    uses OTC meds with help  . COPD (chronic obstructive pulmonary disease) ([place])   . Coronary artery disease    minimal nonobstructive   . Depression   . GERD (gastroesophageal reflux disease)   . Hx of adenomatous polyp of colon 10/03/2014  . Hypercholesteremia   . Hypertension   . Panic attacks   . Perimenopausal   . Skin cancer   . Tobacco user     Patient Active Problem List   Diagnosis Date Noted  . Lumbar herniated disc 12/10/2018  . Major depression 04/02/2015  . Benzodiazepine abuse, continuous ([place]) 01/22/2015  . Opiate dependence ([place]) 01/22/2015  . Hx of adenomatous polyp of colon 10/03/2014  . Vitamin D deficiency 05/08/2014  . COPD (chronic obstructive pulmonary disease) ([place]) 05/06/2011  . Hyperlipidemia 05/05/2011  . INSOMNIA 11/13/2008  . TOBACCO ABUSE 11/10/2008  . Essential hypertension 11/10/2008  . Osteoarthritis 11/10/2008    Past Surgical History:  Procedure Laterality Date  . ABDOMINAL HYSTERECTOMY    . COLONOSCOPY    . Right foot toe surgery    . UPPER GASTROINTESTINAL ENDOSCOPY    . vocal cord     polyp removal     OB History   No  obstetric history on file.     Family History  Problem Relation Age of Onset  . Heart disease Mother   . Heart attack Mother   . Heart attack Father   . Heart attack Brother   . Colon cancer Brother 52       died at age 71  . Anxiety disorder Brother   . Depression Brother   . Alcohol abuse Brother   . Colon polyps Brother   . Colon polyps Brother   . Colon polyps Sister   . Esophageal cancer Sister   . Anxiety disorder Sister   . Depression Sister   . Colon polyps Sister   . Drug abuse Son   . Rectal cancer Neg Hx   . Stomach cancer Neg Hx     Social History   Tobacco Use  . Smoking status: Current Every Day Smoker    Packs/day: 0.50    Years: 40.00    Pack years: 20.00    Types: Cigarettes    Start date: 05/01/1969  . Smokeless tobacco: Never Used  . Tobacco comment: would like information. has tried chantix and wellbutrin  Substance Use Topics  . Alcohol use: No    Alcohol/week: 0.0 standard drinks    Comment: Per pt no 04-02-15.  . Drug use: No  Comment: per pt, Cocaine was in her system October and November 2016 when she went to the pain clinic 04-02-15.    Home Medications Prior to Admission medications   Medication Sig Start Date End Date Taking? Authorizing Provider  albuterol (VENTOLIN HFA) 108 (90 Base) MCG/ACT inhaler Inhale 2 puffs into the lungs every 4 (four) hours as needed for wheezing or shortness of breath. 06/21/18  Yes Stacks, Cletus Gash, MD  amLODipine (NORVASC) 5 MG tablet TAKE 1 TABLET BY MOUTH  DAILY 12/07/18  Yes Stacks, Cletus Gash, MD  Ascorbic Acid (VITAMIN C) 1000 MG tablet Take 1,000 mg by mouth daily.   Yes [provider]  aspirin EC 81 MG tablet Take 81 mg by mouth daily.   Yes [provider]  atorvastatin (LIPITOR) 40 MG tablet TAKE 1 TABLET BY MOUTH AT  BEDTIME 12/07/18  Yes Stacks, Cletus Gash, MD  budesonide-formoterol (SYMBICORT) 160-4.5 MCG/ACT inhaler INHALE TWO PUFFS INTO THE LUNGS TWICE DAILY Patient taking differently:  Inhale 2 puffs into the lungs 2 (two) times a day. INHALE TWO PUFFS INTO THE LUNGS TWICE DAILY 06/21/18  Yes Stacks, Cletus Gash, MD  clonazePAM (KLONOPIN) 1 MG tablet Take 1 tablet (1 mg total) by mouth 3 (three) times daily as needed. for anxiety 02/09/19  Yes Cloria Spring, MD  cloNIDine (CATAPRES) 0.1 MG tablet TAKE ONE TABLET BY MOUTH AT BEDTIME. 01/24/19  Yes Claretta Fraise, MD  DULoxetine (CYMBALTA) 60 MG capsule Take 1 capsule (60 mg total) by mouth 2 (two) times daily. 02/09/19  Yes Cloria Spring, MD  fish oil-omega-3 fatty acids 1000 MG capsule Take 1 g by mouth daily.    Yes [provider]  gabapentin (NEURONTIN) 300 MG capsule Take 300-600 mg by mouth See admin instructions. Take 1 capsule in the morning and 2 capsule at bedtime 06/15/18  Yes [provider]  ipratropium-albuterol (DUONEB) 0.5-2.5 (3) MG/3ML SOLN Take 3 mLs by nebulization every 6 (six) hours as needed. 05/31/18  Yes Mannam, Praveen, MD  lisinopril (ZESTRIL) 20 MG tablet TAKE 1 TABLET BY MOUTH  DAILY 12/07/18  Yes Stacks, Cletus Gash, MD  meloxicam (MOBIC) 15 MG tablet Take 15 mg by mouth daily. 11/17/17  Yes [provider]  Oxycodone HCl 10 MG TABS Take 10 mg by mouth every 6 (six) hours as needed (pain).  07/30/17  Yes [provider]  pantoprazole (PROTONIX) 40 MG tablet TAKE 1 TABLET BY MOUTH  TWICE DAILY 12/07/18  Yes Claretta Fraise, MD  SPIRIVA RESPIMAT 2.5 MCG/ACT AERS INHALE TWO PUFFS INTO THE LUNGS DAILY. 12/31/18  Yes Claretta Fraise, MD  amoxicillin-clavulanate (AUGMENTIN) 875-125 MG tablet Take 1 tablet by mouth every 12 (twelve) hours. 02/10/19   Recardo Evangelist, PA-C  benzonatate (TESSALON) 100 MG capsule Take 1 capsule (100 mg total) by mouth every 8 (eight) hours. 02/10/19   Recardo Evangelist, PA-C  predniSONE (DELTASONE) 20 MG tablet Take 2 tablets (40 mg total) by mouth daily. 02/10/19   Recardo Evangelist, PA-C    Allergies    Lorcet [phone] [hydrocodone-acetaminophen], Sulfa  antibiotics, and Tape  Review of Systems   Review of Systems  Constitutional: Negative for fever.  HENT: Positive for congestion and sinus pain.   Respiratory: Positive for cough, shortness of breath and wheezing.   Cardiovascular: Negative for chest pain.  Gastrointestinal: Negative for abdominal pain.  Neurological: Positive for headaches.  All other systems reviewed and are negative.   Physical Exam Updated Vital Signs BP 136/88   Pulse 74  Temp 98.6 F (37 C) (Oral)   Resp 18   Ht 5\' 5"  (1.651 m)   Wt 68 kg   SpO2 96%   BMI 24.96 kg/m   Physical Exam Vitals and nursing note reviewed.  Constitutional:      General: She is not in acute distress.    Appearance: Normal appearance. She is well-developed. She is not ill-appearing.  HENT:     Head: Normocephalic and atraumatic.     Right Ear: Tympanic membrane normal.     Left Ear: Tympanic membrane normal.     Nose: Nose normal.     Mouth/Throat:     Mouth: Mucous membranes are moist.  Eyes:     General: No scleral icterus.       Right eye: No discharge.        Left eye: No discharge.     Conjunctiva/sclera: Conjunctivae normal.     Pupils: Pupils are equal, round, and reactive to light.  Cardiovascular:     Rate and Rhythm: Normal rate and regular rhythm.  Pulmonary:     Effort: Pulmonary effort is normal. No respiratory distress.     Breath sounds: Wheezing present.  Abdominal:     General: There is no distension.     Palpations: Abdomen is soft.  Musculoskeletal:     Cervical back: Normal range of motion.  Skin:    General: Skin is warm and dry.  Neurological:     Mental Status: She is alert and oriented to person, place, and time.  Psychiatric:        Behavior: Behavior normal.     ED Results / Procedures / Treatments   Labs (all labs ordered are listed, but only abnormal results are displayed) Labs Reviewed  BASIC METABOLIC PANEL - Abnormal; Notable for the following components:      Result  Value   Glucose, Bld 110 (*)    All other components within normal limits  NOVEL CORONAVIRUS, NAA (HOSP ORDER, SEND-OUT TO REF LAB; TAT 18-24 HRS)  CBC WITH DIFFERENTIAL/PLATELET    EKG None  Radiology DG Chest Port 1 View  Result Date: 02/10/2019 CLINICAL DATA:  Dry cough. EXAM: PORTABLE CHEST 1 VIEW COMPARISON:  May 10th 2020 FINDINGS: The heart size and mediastinal contours are within normal limits. Both lungs are clear. The visualized skeletal structures are unremarkable. IMPRESSION: No active disease. Electronically Signed   By: Abelardo Diesel M.D.   On: 02/10/2019 22:00    Procedures Procedures (including critical care time)  Medications Ordered in ED Medications - No data to display  ED Course  I have reviewed the triage vital signs and the nursing notes.  Pertinent labs & imaging results that were available during my care of the patient were reviewed by me and considered in my medical decision making (see chart for details).    MDM Rules/Calculators/A&P  63 year old female presents with headache, sinus pain/pressure, cough, wheezing, SOB. Likely sinusitis/bronchitis. She does have some wheezing on exam and sinus tenderness. Her labs are normal. CXR is clear. Vitals are normal. Will treat with prednisone, augmentin, tessalon. COVID test was sent. She was advised to quarantine until she gets her results.  [name] was evaluated in Emergency Department on 02/11/2019 for the symptoms described in the history of present illness. She was evaluated in the context of the global COVID-19 pandemic, which necessitated consideration that the patient might be at risk for infection with the SARS-CoV-2 virus that causes COVID-19. Institutional protocols  and algorithms that pertain to the evaluation of patients at risk for COVID-19 are in a state of rapid change based on information released by regulatory bodies including the CDC and federal and state organizations. These policies and  algorithms were followed during the patient's care in the ED.   Final Clinical Impression(s) / ED Diagnoses Final diagnoses:  Cough  Acute non-recurrent frontal sinusitis  Bronchitis    Rx / DC Orders ED Discharge Orders         Ordered    predniSONE (DELTASONE) 20 MG tablet  Daily     02/10/19 2325    amoxicillin-clavulanate (AUGMENTIN) 875-125 MG tablet  Every 12 hours     02/10/19 2325    benzonatate (TESSALON) 100 MG capsule  Every 8 hours     02/10/19 2325           Recardo Evangelist, PA-C 02/11/19 0018    Fredia Sorrow, MD 02/19/19 [phone]

## 2019-02-13 LAB — NOVEL CORONAVIRUS, NAA (HOSP ORDER, SEND-OUT TO REF LAB; TAT 18-24 HRS): SARS-CoV-2, NAA: NOT DETECTED

## 2019-02-17 ENCOUNTER — Encounter: Payer: Self-pay | Admitting: Family

## 2019-02-17 ENCOUNTER — Ambulatory Visit (INDEPENDENT_AMBULATORY_CARE_PROVIDER_SITE_OTHER): Payer: Medicare Other | Admitting: Family

## 2019-02-17 DIAGNOSIS — Z09 Encounter for follow-up examination after completed treatment for conditions other than malignant neoplasm: Secondary | ICD-10-CM

## 2019-02-17 DIAGNOSIS — J0101 Acute recurrent maxillary sinusitis: Secondary | ICD-10-CM

## 2019-02-17 MED ORDER — DOXYCYCLINE HYCLATE 100 MG PO TABS: 100.0000 mg | ORAL_TABLET | Freq: Two times a day (BID) | ORAL | 0 refills | Status: DC

## 2019-02-17 NOTE — Progress Notes (Signed)
Virtual Visit via telephone Note Due to COVID-19 pandemic this visit was conducted virtually. This visit type was conducted due to national recommendations for restrictions regarding the COVID-19 Pandemic (e.g. social distancing, sheltering in place) in an effort to limit this patient's exposure and mitigate transmission in our community. All issues noted in this document were discussed and addressed.  A physical exam was not performed with this format.  I connected with [name] on 02/17/19 at 2:05 pm by telephone and verified that I am speaking with the correct person using two identifiers. [name] is currently located at home and no on is currently with her during visit. The provider, Evelina Dun, FNP is located in their office at time of visit.  I discussed the limitations, risks, security and privacy concerns of performing an evaluation and management service by telephone and the availability of in person appointments. I also discussed with the patient that there may be a patient responsible charge related to this service. The patient expressed understanding and agreed to proceed.   History and Present Illness:  PT calls the office today with recurrent sinus pressure and right  ear pain. She went to the ED on 02/10/19 and was given Augmentin for 5 days and prednisone, tessalon. She had a negative COVID test. She reports she was feeling slightly better, but her headache and ear pain have worsen over the last few days.  Sinusitis This is a recurrent problem. The current episode started 1 to 4 weeks ago. The problem has been gradually worsening since onset. There has been no fever. Her pain is at a severity of 7/10. The pain is moderate. Associated symptoms include congestion, coughing, ear pain, headaches, a hoarse voice, sinus pressure and a sore throat. Past treatments include antibiotics. The treatment provided mild relief.      Review of Systems  HENT: Positive for  congestion, ear pain, hoarse voice, sinus pressure and sore throat.   Respiratory: Positive for cough.   Neurological: Positive for headaches.     Observations/Objective: NO SOB or distress noted, hoarse and congested voice  Assessment and Plan: 1. Acute recurrent maxillary sinusitis - Take meds as prescribed - Use a cool mist humidifier  -Use saline nose sprays frequently -Force fluids -For any cough or congestion  Use plain Mucinex- regular strength or max strength is fine -For fever or aces or pains- take tylenol or ibuprofen. -Throat lozenges if help -RTO if symptoms worsen or do not improve  - doxycycline (VIBRA-TABS) 100 MG tablet; Take 1 tablet (100 mg total) by mouth 2 (two) times daily.  Dispense: 20 tablet; Refill: 0  2. Hospital discharge follow-up     I discussed the assessment and treatment plan with the patient. The patient was provided an opportunity to ask questions and all were answered. The patient agreed with the plan and demonstrated an understanding of the instructions.   The patient was advised to call back or seek an in-person evaluation if the symptoms worsen or if the condition fails to improve as anticipated.  The above assessment and management plan was discussed with the patient. The patient verbalized understanding of and has agreed to the management plan. Patient is aware to call the clinic if symptoms persist or worsen. Patient is aware when to return to the clinic for a follow-up visit. Patient educated on when it is appropriate to go to the emergency department.   Time call ended:  2:15 pm   I provided 10 minutes of  non-face-to-face time during this encounter.    Evelina Dun, FNP

## 2019-02-26 ENCOUNTER — Other Ambulatory Visit: Payer: Self-pay | Admitting: Family Medicine

## 2019-02-28 ENCOUNTER — Other Ambulatory Visit: Payer: Self-pay | Admitting: Family Medicine

## 2019-02-28 DIAGNOSIS — E782 Mixed hyperlipidemia: Secondary | ICD-10-CM

## 2019-02-28 DIAGNOSIS — I10 Essential (primary) hypertension: Secondary | ICD-10-CM

## 2019-02-28 DIAGNOSIS — K219 Gastro-esophageal reflux disease without esophagitis: Secondary | ICD-10-CM

## 2019-03-02 DIAGNOSIS — M6281 Muscle weakness (generalized): Secondary | ICD-10-CM | POA: Diagnosis not present

## 2019-03-04 DIAGNOSIS — J449 Chronic obstructive pulmonary disease, unspecified: Secondary | ICD-10-CM | POA: Diagnosis not present

## 2019-03-05 ENCOUNTER — Emergency Department (HOSPITAL_COMMUNITY)
Admission: EM | Admit: 2019-03-05 | Discharge: 2019-03-06 | Disposition: A | Discharge status: Home / Self Care | Payer: Medicare Other | Attending: Emergency Medicine | Admitting: Emergency Medicine

## 2019-03-05 ENCOUNTER — Other Ambulatory Visit: Payer: Self-pay

## 2019-03-05 ENCOUNTER — Emergency Department (HOSPITAL_COMMUNITY): Payer: Medicare Other

## 2019-03-05 ENCOUNTER — Encounter (HOSPITAL_COMMUNITY): Payer: Self-pay | Admitting: Emergency Medicine

## 2019-03-05 DIAGNOSIS — R059 Cough, unspecified: Secondary | ICD-10-CM

## 2019-03-05 DIAGNOSIS — R062 Wheezing: Secondary | ICD-10-CM | POA: Diagnosis present

## 2019-03-05 DIAGNOSIS — Z79899 Other long term (current) drug therapy: Secondary | ICD-10-CM | POA: Insufficient documentation

## 2019-03-05 DIAGNOSIS — Z7982 Long term (current) use of aspirin: Secondary | ICD-10-CM | POA: Insufficient documentation

## 2019-03-05 DIAGNOSIS — I2510 Atherosclerotic heart disease of native coronary artery without angina pectoris: Secondary | ICD-10-CM | POA: Diagnosis not present

## 2019-03-05 DIAGNOSIS — Z20822 Contact with and (suspected) exposure to covid-19: Secondary | ICD-10-CM | POA: Diagnosis not present

## 2019-03-05 DIAGNOSIS — R0781 Pleurodynia: Secondary | ICD-10-CM | POA: Diagnosis not present

## 2019-03-05 DIAGNOSIS — R05 Cough: Secondary | ICD-10-CM | POA: Diagnosis not present

## 2019-03-05 DIAGNOSIS — F17210 Nicotine dependence, cigarettes, uncomplicated: Secondary | ICD-10-CM | POA: Insufficient documentation

## 2019-03-05 DIAGNOSIS — J449 Chronic obstructive pulmonary disease, unspecified: Secondary | ICD-10-CM | POA: Diagnosis not present

## 2019-03-05 DIAGNOSIS — Z791 Long term (current) use of non-steroidal anti-inflammatories (NSAID): Secondary | ICD-10-CM | POA: Diagnosis not present

## 2019-03-05 DIAGNOSIS — I10 Essential (primary) hypertension: Secondary | ICD-10-CM | POA: Diagnosis not present

## 2019-03-05 DIAGNOSIS — R7989 Other specified abnormal findings of blood chemistry: Secondary | ICD-10-CM | POA: Diagnosis not present

## 2019-03-05 DIAGNOSIS — R0602 Shortness of breath: Secondary | ICD-10-CM | POA: Diagnosis not present

## 2019-03-05 LAB — D-DIMER, QUANTITATIVE: D-Dimer, Quant: 0.79 ug/mL-FEU — ABNORMAL HIGH (ref 0.00–0.50)

## 2019-03-05 LAB — CBC
HCT: 41.7 % (ref 36.0–46.0)
Hemoglobin: 13.6 g/dL (ref 12.0–15.0)
MCH: 32.1 pg (ref 26.0–34.0)
MCHC: 32.6 g/dL (ref 30.0–36.0)
MCV: 98.3 fL (ref 80.0–100.0)
Platelets: 303 10*3/uL (ref 150–400)
RBC: 4.24 MIL/uL (ref 3.87–5.11)
RDW: 14.6 % (ref 11.5–15.5)
WBC: 9.7 10*3/uL (ref 4.0–10.5)
nRBC: 0 % (ref 0.0–0.2)

## 2019-03-05 LAB — BASIC METABOLIC PANEL
Anion gap: 8 (ref 5–15)
BUN: 19 mg/dL (ref 8–23)
CO2: 27 mmol/L (ref 22–32)
Calcium: 9.2 mg/dL (ref 8.9–10.3)
Chloride: 107 mmol/L (ref 98–111)
Creatinine, Ser: 0.76 mg/dL (ref 0.44–1.00)
GFR calc Af Amer: >60 mL/min (ref >60)
GFR calc non Af Amer: >60 mL/min (ref >60)
Glucose, Bld: 110 mg/dL — ABNORMAL HIGH (ref 70–99)
Potassium: 3.6 mmol/L (ref 3.5–5.1)
Sodium: 142 mmol/L (ref 135–145)

## 2019-03-05 LAB — BRAIN NATRIURETIC PEPTIDE: B Natriuretic Peptide: 25 pg/mL (ref 0.0–100.0)

## 2019-03-05 MED ORDER — BENZONATATE 100 MG PO CAPS
100.0000 mg | ORAL_CAPSULE | Freq: Once | ORAL | Status: AC
  Administered 2019-03-05: 100 mg via ORAL
  Filled 2019-03-05: qty 1

## 2019-03-05 MED ORDER — ALBUTEROL SULFATE HFA 108 (90 BASE) MCG/ACT IN AERS
2.0000 | INHALATION_SPRAY | RESPIRATORY_TRACT | Status: DC | PRN
  Administered 2019-03-05: 2 via RESPIRATORY_TRACT
  Filled 2019-03-05: qty 6.7

## 2019-03-05 NOTE — ED Provider Notes (Signed)
[facility] EMERGENCY DEPARTMENT Provider Note   CSN: [id number] Arrival date & time: 03/05/19  2054     History Chief Complaint  Patient presents with  . Cough    [name] is a 64 y.o. female.  The history is provided by the patient and medical records. No language interpreter was used.  Cough Associated symptoms: wheezing      64 year old female with history of COPD, CAD, GERD, hypertension, anxiety presenting for evaluation of cough and congestion.  Patient report for more than a month she has had cold symptoms with frontal headache, sinus congestion, sore throat, dry cough, shortness of breath, wheezing.  She has been seen in the ED several times for her complaint, was tested for COVID-19 which came back negative.  She has also been given several dose of steroids, as well as antibiotic, and cough medication but report no improvement.  She complained of pleuritic chest pain most Lee on the right side of chest, with increasing as of breath and wheezing.  She endorsed low-grade fever.  She denies any recent sick contact with anyone with COVID-19.  She does admits that she use tobacco products.  She denies any leg swelling or calf pain.  No prior history of PE or DVT.  Past Medical History:  Diagnosis Date  . Allergy   . Arthritis   . Constipation    uses OTC meds with help  . COPD (chronic obstructive pulmonary disease) ([place])   . Coronary artery disease    minimal nonobstructive   . Depression   . GERD (gastroesophageal reflux disease)   . Hx of adenomatous polyp of colon 10/03/2014  . Hypercholesteremia   . Hypertension   . Panic attacks   . Perimenopausal   . Skin cancer   . Tobacco user     Patient Active Problem List   Diagnosis Date Noted  . Lumbar herniated disc 12/10/2018  . Major depression 04/02/2015  . Benzodiazepine abuse, continuous ([place]) 01/22/2015  . Opiate dependence ([place]) 01/22/2015  . Hx of adenomatous polyp of colon 10/03/2014  . Vitamin D  deficiency 05/08/2014  . COPD (chronic obstructive pulmonary disease) ([place]) 05/06/2011  . Hyperlipidemia 05/05/2011  . INSOMNIA 11/13/2008  . TOBACCO ABUSE 11/10/2008  . Essential hypertension 11/10/2008  . Osteoarthritis 11/10/2008    Past Surgical History:  Procedure Laterality Date  . ABDOMINAL HYSTERECTOMY    . COLONOSCOPY    . Right foot toe surgery    . UPPER GASTROINTESTINAL ENDOSCOPY    . vocal cord     polyp removal     OB History   No obstetric history on file.     Family History  Problem Relation Age of Onset  . Heart disease Mother   . Heart attack Mother   . Heart attack Father   . Heart attack Brother   . Colon cancer Brother 30       died at age 75  . Anxiety disorder Brother   . Depression Brother   . Alcohol abuse Brother   . Colon polyps Brother   . Colon polyps Brother   . Colon polyps Sister   . Esophageal cancer Sister   . Anxiety disorder Sister   . Depression Sister   . Colon polyps Sister   . Drug abuse Son   . Rectal cancer Neg Hx   . Stomach cancer Neg Hx     Social History   Tobacco Use  . Smoking status: Current Every Day Smoker  Packs/day: 0.50    Years: 40.00    Pack years: 20.00    Types: Cigarettes    Start date: 05/01/1969  . Smokeless tobacco: Never Used  . Tobacco comment: would like information. has tried chantix and wellbutrin  Substance Use Topics  . Alcohol use: No    Alcohol/week: 0.0 standard drinks    Comment: Per pt no 04-02-15.  . Drug use: No    Comment: per pt, Cocaine was in her system October and November 2016 when she went to the pain clinic 04-02-15.    Home Medications Prior to Admission medications   Medication Sig Start Date End Date Taking? Authorizing Provider  albuterol (VENTOLIN HFA) 108 (90 Base) MCG/ACT inhaler Inhale 2 puffs into the lungs every 4 (four) hours as needed for wheezing or shortness of breath. 06/21/18   Claretta Fraise, MD  amLODipine (NORVASC) 5 MG tablet TAKE 1 TABLET BY MOUTH   DAILY 02/28/19   Claretta Fraise, MD  Ascorbic Acid (VITAMIN C) 1000 MG tablet Take 1,000 mg by mouth daily.    [provider]  aspirin EC 81 MG tablet Take 81 mg by mouth daily.    [provider]  atorvastatin (LIPITOR) 40 MG tablet TAKE 1 TABLET BY MOUTH AT  BEDTIME 02/28/19   Claretta Fraise, MD  benzonatate (TESSALON) 100 MG capsule Take 1 capsule (100 mg total) by mouth every 8 (eight) hours. 02/10/19   Recardo Evangelist, PA-C  budesonide-formoterol (SYMBICORT) 160-4.5 MCG/ACT inhaler INHALE TWO PUFFS INTO THE LUNGS TWICE DAILY Patient taking differently: Inhale 2 puffs into the lungs 2 (two) times a day. INHALE TWO PUFFS INTO THE LUNGS TWICE DAILY 06/21/18   Claretta Fraise, MD  clonazePAM (KLONOPIN) 1 MG tablet Take 1 tablet (1 mg total) by mouth 3 (three) times daily as needed. for anxiety 02/09/19   Cloria Spring, MD  cloNIDine (CATAPRES) 0.1 MG tablet TAKE ONE TABLET BY MOUTH AT BEDTIME. 01/24/19   Claretta Fraise, MD  doxycycline (VIBRA-TABS) 100 MG tablet Take 1 tablet (100 mg total) by mouth 2 (two) times daily. 02/17/19   Sharion Balloon, FNP  DULoxetine (CYMBALTA) 60 MG capsule Take 1 capsule (60 mg total) by mouth 2 (two) times daily. 02/09/19   Cloria Spring, MD  fish oil-omega-3 fatty acids 1000 MG capsule Take 1 g by mouth daily.     [provider]  gabapentin (NEURONTIN) 300 MG capsule Take 300-600 mg by mouth See admin instructions. Take 1 capsule in the morning and 2 capsule at bedtime 06/15/18   [provider]  ipratropium-albuterol (DUONEB) 0.5-2.5 (3) MG/3ML SOLN Take 3 mLs by nebulization every 6 (six) hours as needed. 05/31/18   Mannam, Hart Robinsons, MD  lisinopril (ZESTRIL) 20 MG tablet TAKE 1 TABLET BY MOUTH  DAILY 02/28/19   Claretta Fraise, MD  meloxicam (MOBIC) 15 MG tablet Take 15 mg by mouth daily. 11/17/17   [provider]  Oxycodone HCl 10 MG TABS Take 10 mg by mouth every 6 (six) hours as needed (pain).  07/30/17   [provider]  pantoprazole (PROTONIX) 40 MG tablet TAKE 1 TABLET BY MOUTH  TWICE DAILY 02/28/19   Claretta Fraise, MD  SPIRIVA RESPIMAT 2.5 MCG/ACT AERS INHALE TWO PUFFS INTO THE LUNGS DAILY. 02/28/19   Claretta Fraise, MD    Allergies    Lorcet [phone] [hydrocodone-acetaminophen], Sulfa antibiotics, and Tape  Review of Systems   Review of Systems  Respiratory: Positive for cough and wheezing.  All other systems reviewed and are negative.   Physical Exam Updated Vital Signs BP (!) 124/103 (BP Location: Right Arm)   Pulse 91   Temp 98.7 F (37.1 C) (Oral)   Resp 18   Ht 5\' 5"  (1.651 m)   Wt 66.7 kg   SpO2 95%   BMI 24.46 kg/m   Physical Exam Vitals and nursing note reviewed.  Constitutional:      General: She is not in acute distress.    Appearance: She is well-developed.  HENT:     Head: Atraumatic.  Eyes:     Conjunctiva/sclera: Conjunctivae normal.  Cardiovascular:     Rate and Rhythm: Normal rate and regular rhythm.     Pulses: Normal pulses.     Heart sounds: Normal heart sounds.  Pulmonary:     Effort: Pulmonary effort is normal.     Breath sounds: Wheezing present.  Abdominal:     Palpations: Abdomen is soft.     Tenderness: There is no abdominal tenderness.  Musculoskeletal:        General: No swelling.     Cervical back: Neck supple.  Skin:    Capillary Refill: Capillary refill takes less than 2 seconds.     Findings: No rash.  Neurological:     Mental Status: She is alert and oriented to person, place, and time.  Psychiatric:        Mood and Affect: Mood normal.     ED Results / Procedures / Treatments   Labs (all labs ordered are listed, but only abnormal results are displayed) Labs Reviewed  BASIC METABOLIC PANEL - Abnormal; Notable for the following components:      Result Value   Glucose, Bld 110 (*)    All other components within normal limits  D-DIMER, QUANTITATIVE (NOT AT [facility]) - Abnormal; Notable for the following components:    D-Dimer, Quant 0.79 (*)    All other components within normal limits  SARS CORONAVIRUS 2 (TAT 6-24 HRS)  BRAIN NATRIURETIC PEPTIDE  CBC    EKG None  ED ECG REPORT   Date: 03/06/2019  Rate: 77  Rhythm: normal sinus rhythm  QRS Axis: normal  Intervals: normal  ST/T Wave abnormalities: nonspecific ST changes  Conduction Disutrbances:none  Narrative Interpretation:   Old EKG Reviewed: unchanged  I have personally reviewed the EKG tracing and agree with the computerized printout as noted.   Radiology DG Chest Portable 1 View  Result Date: 03/05/2019 CLINICAL DATA:  Cough and congestion EXAM: PORTABLE CHEST 1 VIEW COMPARISON:  February 10, 2019 FINDINGS: Again noted are findings of COPD without evidence for a focal infiltrate or pneumothorax. The heart size is stable from prior study. Aortic calcifications are noted. There is no acute osseous abnormality. IMPRESSION: No active disease. Electronically Signed   By: Constance Holster M.D.   On: 03/05/2019 22:30    Procedures Procedures (including critical care time)  Medications Ordered in ED Medications  albuterol (VENTOLIN HFA) 108 (90 Base) MCG/ACT inhaler 2 puff (2 puffs Inhalation Given 03/05/19 2310)  benzonatate (TESSALON) capsule 100 mg (100 mg Oral Given 03/05/19 2310)    ED Course  I have reviewed the triage vital signs and the nursing notes.  Pertinent labs & imaging results that were available during my care of the patient were reviewed by me and considered in my medical decision making (see chart for details).    MDM Rules/Calculators/A&P  BP (!) 124/103 (BP Location: Right Arm)   Pulse 91   Temp 98.7 F (37.1 C) (Oral)   Resp 18   Ht 5\' 5"  (1.651 m)   Wt 66.7 kg   SpO2 95%   BMI 24.46 kg/m   Final Clinical Impression(s) / ED Diagnoses Final diagnoses:  None    Rx / DC Orders ED Discharge Orders    None     10:43 PM Patient presents with cold symptoms ongoing for the past month  however no relief despite taking 2 different antibiotics, steroid, cough medication and inhaler.  Previous COVID-19 test that was obtained was negative.  No loss of taste or smell.  Today chest x-ray unremarkable.  She does complain of right-sided pleuritic chest pain therefore I will obtain D-dimer and check basic labs. sxs likely consistent with COPD exacerbation from bronchitis.  12:08 AM EKG without acute ischemic changes.  Labs are mostly reassuring with exception of mildly elevated D-dimer of 0.79.  In the setting of pleuritic chest pain, shortness of breath, and elevated D-dimer, will obtain chest CT angiogram to rule out PE.  Patient voiced understanding and agrees with plan.  Patient giving albuterol inhaler in the ER, felt a bit better.  12:46 AM Pt sign out to Dr. Dayna Barker who will f/u on chest CTA result and determine disposition.    Domenic Moras, PA-C 03/10/19 [place], [place], MD 03/14/19 1751

## 2019-03-05 NOTE — ED Notes (Signed)
Seen here recently   Given prednisone   Called [facility], Dr Livia Snellen who did not see, but called another antibiotic in   Here because she was no better

## 2019-03-05 NOTE — ED Notes (Signed)
Report to [place], RN 

## 2019-03-05 NOTE — ED Triage Notes (Signed)
Pt c/o cough and congestion x 1 month, reports she has taken 2 courses of abx and prednisone with no relief, c/o of sharp pain to right side with inhalation

## 2019-03-06 ENCOUNTER — Emergency Department (HOSPITAL_COMMUNITY): Payer: Medicare Other

## 2019-03-06 DIAGNOSIS — R7989 Other specified abnormal findings of blood chemistry: Secondary | ICD-10-CM | POA: Diagnosis not present

## 2019-03-06 DIAGNOSIS — R05 Cough: Secondary | ICD-10-CM | POA: Diagnosis not present

## 2019-03-06 LAB — SARS CORONAVIRUS 2 (TAT 6-24 HRS): SARS Coronavirus 2: NEGATIVE

## 2019-03-06 MED ORDER — HYDROCODONE-HOMATROPINE 5-1.5 MG/5ML PO SYRP
5.0000 mL | ORAL_SOLUTION | Freq: Once | ORAL | Status: AC
  Administered 2019-03-06: 5 mL via ORAL
  Filled 2019-03-06: qty 5

## 2019-03-06 MED ORDER — PREDNISONE 20 MG PO TABS: ORAL_TABLET | ORAL | 0 refills | Status: DC

## 2019-03-06 MED ORDER — PREDNISONE 50 MG PO TABS
60.0000 mg | ORAL_TABLET | Freq: Once | ORAL | Status: AC
  Administered 2019-03-06: 60 mg via ORAL
  Filled 2019-03-06: qty 1

## 2019-03-06 MED ORDER — METHYLPREDNISOLONE SODIUM SUCC 125 MG IJ SOLR
125.0000 mg | Freq: Once | INTRAMUSCULAR | Status: AC
  Administered 2019-03-06: 125 mg via INTRAVENOUS
  Filled 2019-03-06: qty 2

## 2019-03-06 MED ORDER — IOHEXOL 350 MG/ML SOLN
100.0000 mL | Freq: Once | INTRAVENOUS | Status: AC | PRN
  Administered 2019-03-06: 100 mL via INTRAVENOUS

## 2019-03-06 MED ORDER — HYDROCODONE-HOMATROPINE 5-1.5 MG/5ML PO SYRP: 5.0000 mL | ORAL_SOLUTION | Freq: Three times a day (TID) | ORAL | 0 refills | Status: DC | PRN

## 2019-03-06 NOTE — ED Provider Notes (Signed)
Medical screening examination/treatment/procedure(s) were conducted as a shared visit with non-physician practitioner(s) and myself.  I personally evaluated the patient during the encounter.  Copd, completed steroids, antibiotics without resolution of cough, pleuritic CP. VS reassurring. D dimer elevated, pending CTA.  cta ok, will do pred taper, anti-tussives, close pcp follow up for likely post-bronchitic cough. Overall appears well, stable and reassuring VS.         Mikhaela Zaugg, Corene Cornea, MD 03/06/19 [id number]

## 2019-03-06 NOTE — ED Notes (Signed)
Pt taken to cT

## 2019-03-15 ENCOUNTER — Telehealth: Payer: Self-pay | Admitting: Family Medicine

## 2019-03-15 NOTE — Telephone Encounter (Signed)
REFERRAL REQUEST Telephone Note  What type of referral do you need? ENT  Have you been seen at our office for this problem? Patient Called and left VM. (Advise that they will likely need an appointment with their PCP before a referral can be done)  Is there a particular doctor or location that you prefer? Dr. Benjamine Mola  Patient notified that referrals can take up to a week or longer to process. If they haven't heard anything within a week they should call back and speak with the referral department.

## 2019-03-16 ENCOUNTER — Encounter: Payer: Self-pay | Admitting: Family Medicine

## 2019-03-16 ENCOUNTER — Ambulatory Visit (INDEPENDENT_AMBULATORY_CARE_PROVIDER_SITE_OTHER): Payer: Medicare Other | Admitting: Family Medicine

## 2019-03-16 DIAGNOSIS — R490 Dysphonia: Secondary | ICD-10-CM

## 2019-03-16 MED ORDER — LEVOFLOXACIN 500 MG PO TABS: 500.0000 mg | ORAL_TABLET | Freq: Every day | ORAL | 0 refills | Status: DC

## 2019-03-16 NOTE — Telephone Encounter (Signed)
Televisit appointment made.  Patient aware

## 2019-03-16 NOTE — Telephone Encounter (Signed)
Pt. Needs to be seen for this. (or televisit) Thanks, WS

## 2019-03-16 NOTE — Progress Notes (Signed)
Subjective:    Patient ID: [name], female    DOB: [birth year], 64 y.o.   MRN: [id number]   HPI: [name] is a 64 y.o. female presenting for losing my voice. Has been told in the past she had vocal cord polyps. Has had them removed twice in the past. About  25 years ago  Most recent Dr.  located in [place]. Saw him 1.5 years ago. Also having earache. Also seen in E.D. recently for cough.   Depression screen [facility] 2/9 07/15/2018 07/08/2018 06/21/2018 04/28/2018 04/13/2018  Decreased Interest 0 0 1 0 0  Down, Depressed, Hopeless 0 0 2 0 0  PHQ - 2 Score 0 0 3 0 0  Altered sleeping - - 1 - -  Tired, decreased energy - - 1 - -  Change in appetite - - 1 - -  Feeling bad or failure about yourself  - - 0 - -  Trouble concentrating - - 1 - -  Moving slowly or fidgety/restless - - 0 - -  Suicidal thoughts - - 0 - -  PHQ-9 Score - - 7 - -  Difficult doing work/chores - - Somewhat difficult - -  Some encounter information is confidential and restricted. Go to Review Flowsheets activity to see all data.  Some recent data might be hidden     Relevant past medical, surgical, family and social history reviewed and updated as indicated.  Interim medical history since our last visit reviewed. Allergies and medications reviewed and updated.  ROS:  Review of Systems   Social History   Tobacco Use  Smoking Status Current Every Day Smoker  . Packs/day: 0.50  . Years: 40.00  . Pack years: 20.00  . Types: Cigarettes  . Start date: 05/01/1969  Smokeless Tobacco Never Used  Tobacco Comment   would like information. has tried chantix and wellbutrin       Objective:     Wt Readings from Last 3 Encounters:  03/05/19 147 lb (66.7 kg)  02/10/19 150 lb (68 kg)  12/10/18 148 lb (67.1 kg)     Exam deferred. Pt. Harboring due to COVID 19. Phone visit performed.   Assessment & Plan:   1. Hoarseness   His hoarseness is likely related to sinus drainage.  However with her complicated  medical history including vocal polyps ENT evaluation seems appropriate.  This is particularly important based on her fear of throat cancer due to friends having been diagnosed.  She has seen an ENT in the past for this many years ago she had polyps removed.  She does not remember exactly who did that.  She saw an ENT in [place] a couple of years ago.  She would not mind seeing him again but she does not remember the name.  She thinks it might have been Dr. Benjamine Mola.  She will look into this and will try to do that from this office as well and find an appropriate specialist for her.  In the meantime I want to treat for pansinusitis with the medication noted below.  She is just finishing up a steroid pack from a recent for evaluation.  Meds ordered this encounter  Medications  . levofloxacin (LEVAQUIN) 500 MG tablet    Sig: Take 1 tablet (500 mg total) by mouth daily. For 10 days    Dispense:  10 tablet    Refill:  0    Orders Placed This Encounter  Procedures  . ENT  Referral Priority:   Routine    Referral Type:   Consultation    Referral Reason:   Specialty Services Required    Requested Specialty:   Otolaryngology    Number of Visits Requested:   1      Diagnoses and all orders for this visit:  Hoarseness -     ENT  Other orders -     levofloxacin (LEVAQUIN) 500 MG tablet; Take 1 tablet (500 mg total) by mouth daily. For 10 days    Virtual Visit via telephone Note  I discussed the limitations, risks, security and privacy concerns of performing an evaluation and management service by telephone and the availability of in person appointments. The patient was identified with two identifiers. Pt.expressed understanding and agreed to proceed. Pt. Is at home. Dr. Livia Snellen is in his office.  Follow Up Instructions:   I discussed the assessment and treatment plan with the patient. The patient was provided an opportunity to ask questions and all were answered. The patient agreed with the  plan and demonstrated an understanding of the instructions.   The patient was advised to call back or seek an in-person evaluation if the symptoms worsen or if the condition fails to improve as anticipated.   Total minutes including chart review and phone contact time: 14   Follow up plan: Return if symptoms worsen or fail to improve.  Claretta Fraise, MD [place]

## 2019-03-17 DIAGNOSIS — M542 Cervicalgia: Secondary | ICD-10-CM | POA: Diagnosis not present

## 2019-03-17 DIAGNOSIS — Z79899 Other long term (current) drug therapy: Secondary | ICD-10-CM | POA: Diagnosis not present

## 2019-03-17 DIAGNOSIS — G43909 Migraine, unspecified, not intractable, without status migrainosus: Secondary | ICD-10-CM | POA: Diagnosis not present

## 2019-03-21 ENCOUNTER — Other Ambulatory Visit: Payer: Self-pay | Admitting: Family Medicine

## 2019-04-04 DIAGNOSIS — J449 Chronic obstructive pulmonary disease, unspecified: Secondary | ICD-10-CM | POA: Diagnosis not present

## 2019-04-11 DIAGNOSIS — M6281 Muscle weakness (generalized): Secondary | ICD-10-CM | POA: Diagnosis not present

## 2019-04-12 DIAGNOSIS — R490 Dysphonia: Secondary | ICD-10-CM | POA: Diagnosis not present

## 2019-04-12 DIAGNOSIS — H9203 Otalgia, bilateral: Secondary | ICD-10-CM | POA: Diagnosis not present

## 2019-04-12 DIAGNOSIS — R0982 Postnasal drip: Secondary | ICD-10-CM | POA: Diagnosis not present

## 2019-04-12 DIAGNOSIS — K219 Gastro-esophageal reflux disease without esophagitis: Secondary | ICD-10-CM | POA: Diagnosis not present

## 2019-04-12 DIAGNOSIS — F17210 Nicotine dependence, cigarettes, uncomplicated: Secondary | ICD-10-CM | POA: Diagnosis not present

## 2019-04-18 ENCOUNTER — Telehealth: Payer: Self-pay | Admitting: Family Medicine

## 2019-04-19 NOTE — Telephone Encounter (Signed)
Forms faxed back to company on 04/19/19

## 2019-04-28 ENCOUNTER — Other Ambulatory Visit (HOSPITAL_COMMUNITY): Payer: Self-pay | Admitting: Psychiatry

## 2019-04-28 DIAGNOSIS — F332 Major depressive disorder, recurrent severe without psychotic features: Secondary | ICD-10-CM

## 2019-04-28 NOTE — Telephone Encounter (Signed)
Call pt for appt 

## 2019-04-28 NOTE — Telephone Encounter (Signed)
Per Provider: Call pt for appt. LVM for call back to schedule appt. Last appt 02/20/2019

## 2019-04-30 ENCOUNTER — Other Ambulatory Visit (HOSPITAL_COMMUNITY): Payer: Self-pay | Admitting: Psychiatry

## 2019-04-30 DIAGNOSIS — F411 Generalized anxiety disorder: Secondary | ICD-10-CM

## 2019-05-02 ENCOUNTER — Ambulatory Visit (INDEPENDENT_AMBULATORY_CARE_PROVIDER_SITE_OTHER): Payer: Medicare Other | Admitting: Psychiatry

## 2019-05-02 ENCOUNTER — Encounter (HOSPITAL_COMMUNITY): Payer: Self-pay | Admitting: Psychiatry

## 2019-05-02 ENCOUNTER — Other Ambulatory Visit: Payer: Self-pay

## 2019-05-02 DIAGNOSIS — F411 Generalized anxiety disorder: Secondary | ICD-10-CM

## 2019-05-02 DIAGNOSIS — F332 Major depressive disorder, recurrent severe without psychotic features: Secondary | ICD-10-CM

## 2019-05-02 DIAGNOSIS — J449 Chronic obstructive pulmonary disease, unspecified: Secondary | ICD-10-CM | POA: Diagnosis not present

## 2019-05-02 MED ORDER — DULOXETINE HCL 60 MG PO CPEP: 60.0000 mg | ORAL_CAPSULE | Freq: Two times a day (BID) | ORAL | 2 refills | Status: DC

## 2019-05-02 MED ORDER — CLONAZEPAM 1 MG PO TABS: 1.0000 mg | ORAL_TABLET | Freq: Three times a day (TID) | ORAL | 2 refills | Status: DC | PRN

## 2019-05-02 NOTE — Progress Notes (Signed)
Virtual Visit via Telephone Note  I connected with [name] on 05/02/19 at  3:00 PM EST by telephone and verified that I am speaking with the correct person using two identifiers.   I discussed the limitations, risks, security and privacy concerns of performing an evaluation and management service by telephone and the availability of in person appointments. I also discussed with the patient that there may be a patient responsible charge related to this service. The patient expressed understanding and agreed to proceed.    I discussed the assessment and treatment plan with the patient. The patient was provided an opportunity to ask questions and all were answered. The patient agreed with the plan and demonstrated an understanding of the instructions.   The patient was advised to call back or seek an in-person evaluation if the symptoms worsen or if the condition fails to improve as anticipated.  I provided 15 minutes of non-face-to-face time during this encounter.   [name], MD  [facility] MD/PA/NP OP Progress Note  05/02/2019 3:16 PM [name]  MRN:  [id number]  Chief Complaint:  Chief Complaint    Depression; Anxiety; Follow-up     HPI: This patient is a 64 year old widowed white female who lives alone in [place].  She is on disability.  The patient returns for follow-up after 3 months regarding her depression and anxiety.  The patient states that her breathing and loss of voice are getting worse.  She has been diagnosed with emphysema.  She continues to smoke 1 pack a day.  She also has acid reflux.  I noticed that she has not had endoscopy and probably needs to get this set up.  She is trying to quit smoking but has not had much success but is going to try again with the NicoDerm patches.  Overall her mood has been fairly stable.  Both of her sons are still having issues with drugs and alcohol and the older son may be looking at jail time.  They are in their 34s and there really  is not anything more she can do given that they are both adults.  She is spending more time with her brother and his wife and this seems to give her some solace in someone to talk to.  She does think her medications continue to help with her mood and sleep. Visit Diagnosis:    ICD-10-CM   1. Severe episode of recurrent major depressive disorder, without psychotic features (HCC)  F33.2 DULoxetine (CYMBALTA) 60 MG capsule  2. GAD (generalized anxiety disorder)  F41.1 clonazePAM (KLONOPIN) 1 MG tablet    Past Psychiatric History: none  Past Medical History:  Past Medical History:  Diagnosis Date  . Allergy   . Arthritis   . Constipation    uses OTC meds with help  . COPD (chronic obstructive pulmonary disease) ([place])   . Coronary artery disease    minimal nonobstructive   . Depression   . GERD (gastroesophageal reflux disease)   . Hx of adenomatous polyp of colon 10/03/2014  . Hypercholesteremia   . Hypertension   . Panic attacks   . Perimenopausal   . Skin cancer   . Tobacco user     Past Surgical History:  Procedure Laterality Date  . ABDOMINAL HYSTERECTOMY    . COLONOSCOPY    . Right foot toe surgery    . UPPER GASTROINTESTINAL ENDOSCOPY    . vocal cord     polyp removal    Family Psychiatric History:  see below  Family History:  Family History  Problem Relation Age of Onset  . Heart disease Mother   . Heart attack Mother   . Heart attack Father   . Heart attack Brother   . Colon cancer Brother 25       died at age 25  . Anxiety disorder Brother   . Depression Brother   . Alcohol abuse Brother   . Colon polyps Brother   . Colon polyps Brother   . Colon polyps Sister   . Esophageal cancer Sister   . Anxiety disorder Sister   . Depression Sister   . Colon polyps Sister   . Drug abuse Son   . Rectal cancer Neg Hx   . Stomach cancer Neg Hx     Social History:  Social History   Socioeconomic History  . Marital status: Single    Spouse name: Not on file  .  Number of children: Not on file  . Years of education: Not on file  . Highest education level: Not on file  Occupational History  . 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 Smoking status: Current Every Day Smoker    Packs/day: 0.50    Years: 40.00    Pack years: 20.00    Types: Cigarettes    Start date: 05/01/1969  . Smokeless tobacco: Never Used  . Tobacco comment: would like information. has tried chantix and wellbutrin  Substance and Sexual Activity  . Alcohol use: No    Alcohol/week: 0.0 standard drinks    Comment: Per pt no 04-02-15.  . Drug use: No    Comment: per pt, Cocaine was in her system October and November 2016 when she went to the pain clinic 04-02-15.  Marland Kitchen Sexual activity: Never  Other Topics Concern  . Not on file  Social History Narrative   Lives in [place] with husband   Under a lot of family stress   Goes to [facility].         Social Determinants of Health   Financial Resource Strain:   . Difficulty of Paying Living Expenses: Not on file  Food Insecurity:   . Worried About Charity fundraiser in the Last Year: Not on file  . Ran Out of Food in the Last Year: Not on file  Transportation Needs:   . Lack of Transportation (Medical): Not on file  . Lack of Transportation (Non-Medical): Not on file  Physical Activity:   . Days of Exercise per Week: Not on file  . Minutes of Exercise per Session: Not on file  Stress:   . Feeling of Stress : Not on file  Social Connections:   . Frequency of Communication with Friends and Family: Not on file  . Frequency of Social Gatherings with Friends and Family: Not on file  . Attends Religious Services: Not on file  . Active Member of Clubs or Organizations: Not on file  . Attends Archivist Meetings: Not on file  . Marital Status: Not on file    Allergies:  Allergies  Allergen Reactions  . Lorcet 10-650 [Hydrocodone-Acetaminophen] Itching  .  Sulfa Antibiotics Other (See Comments)    aching  . Tape Other (See Comments)    Please use paper tape    Metabolic Disorder Labs: Lab Results  Component Value Date   HGBA1C 5.9 (H) 07/12/2013   MPG 123 (H) 07/12/2013   No  results found for: PROLACTIN Lab Results  Component Value Date   CHOL 165 05/25/2017   TRIG 90 05/25/2017   HDL 51 05/25/2017   CHOLHDL 3.2 05/25/2017   VLDL 36 07/13/2013   LDLCALC 96 05/25/2017   LDLCALC 65 09/01/2016   Lab Results  Component Value Date   TSH 1.820 05/25/2017   TSH 1.080 05/17/2015    Therapeutic Level Labs: No results found for: LITHIUM No results found for: VALPROATE No components found for:  CBMZ  Current Medications: Current Outpatient Medications  Medication Sig Dispense Refill  . albuterol (VENTOLIN HFA) 108 (90 Base) MCG/ACT inhaler Inhale 2 puffs into the lungs every 4 (four) hours as needed for wheezing or shortness of breath. 3 Inhaler 3  . amLODipine (NORVASC) 5 MG tablet TAKE 1 TABLET BY MOUTH  DAILY 90 tablet 3  . Ascorbic Acid (VITAMIN C) 1000 MG tablet Take 1,000 mg by mouth daily.    Marland Kitchen aspirin EC 81 MG tablet Take 81 mg by mouth daily.    Marland Kitchen atorvastatin (LIPITOR) 40 MG tablet TAKE 1 TABLET BY MOUTH AT  BEDTIME 90 tablet 3  . benzonatate (TESSALON) 100 MG capsule Take 1 capsule (100 mg total) by mouth every 8 (eight) hours. 21 capsule 0  . budesonide-formoterol (SYMBICORT) 160-4.5 MCG/ACT inhaler INHALE TWO PUFFS INTO THE LUNGS TWICE DAILY (Patient taking differently: Inhale 2 puffs into the lungs 2 (two) times a day. INHALE TWO PUFFS INTO THE LUNGS TWICE DAILY) 30.6 g 5  . clonazePAM (KLONOPIN) 1 MG tablet Take 1 tablet (1 mg total) by mouth 3 (three) times daily as needed. for anxiety 90 tablet 2  . cloNIDine (CATAPRES) 0.1 MG tablet TAKE ONE TABLET BY MOUTH AT BEDTIME. 30 tablet 5  . DULoxetine (CYMBALTA) 60 MG capsule Take 1 capsule (60 mg total) by mouth 2 (two) times daily. 60 capsule 2  . fish oil-omega-3 fatty  acids 1000 MG capsule Take 1 g by mouth daily.     Marland Kitchen gabapentin (NEURONTIN) 300 MG capsule Take 300-600 mg by mouth See admin instructions. Take 1 capsule in the morning and 2 capsule at bedtime    . HYDROcodone-homatropine (HYCODAN) 5-1.5 MG/5ML syrup Take 5 mLs by mouth every 8 (eight) hours as needed for cough. 120 mL 0  . ipratropium-albuterol (DUONEB) 0.5-2.5 (3) MG/3ML SOLN Take 3 mLs by nebulization every 6 (six) hours as needed. 360 mL 5  . levofloxacin (LEVAQUIN) 500 MG tablet Take 1 tablet (500 mg total) by mouth daily. For 10 days 10 tablet 0  . lisinopril (ZESTRIL) 20 MG tablet TAKE 1 TABLET BY MOUTH  DAILY 90 tablet 3  . pantoprazole (PROTONIX) 40 MG tablet TAKE 1 TABLET BY MOUTH  TWICE DAILY 180 tablet 3  . SPIRIVA RESPIMAT 2.5 MCG/ACT AERS INHALE TWO PUFFS INTO THE LUNGS DAILY. 4 g 0   No current facility-administered medications for this visit.     Musculoskeletal: Strength & Muscle Tone: within normal limits Gait & Station: normal Patient leans: N/A  Psychiatric Specialty Exam: Review of Systems  Respiratory: Positive for cough and shortness of breath. Negative for choking.   Psychiatric/Behavioral: The patient is nervous/anxious.     There were no vitals taken for this visit.There is no height or weight on file to calculate BMI.  General Appearance: NA  Eye Contact:  NA  Speech:  Clear and Coherent  Volume:  Normal  Mood:  Anxious  Affect:  NA  Thought Process:  Goal Directed  Orientation:  Full (  Time, Place, and Person)  Thought Content: Rumination   Suicidal Thoughts:  No  Homicidal Thoughts:  No  Memory:  Immediate;   Good Recent;   Good Remote;   Fair  Judgement:  Good  Insight:  Fair  Psychomotor Activity:  Decreased  Concentration:  Concentration: Fair and Attention Span: Fair  Recall:  Good  Fund of Knowledge: Fair  Language: Good  Akathisia:  No  Handed:  Right  AIMS (if indicated): not done  Assets:  Communication Skills Desire for  Improvement Physical Health Resilience Social Support Talents/Skills  ADL's:  Intact  Cognition: WNL  Sleep:  Good   Screenings: GAD-7     Counselor from 11/20/2015 in [place] Office Visit from 02/09/2015 in [place]  Total GAD-7 Score  9  21    PHQ2-9     Office Visit from 07/15/2018 in [place] Office Visit from 07/08/2018 in [place] Office Visit from 06/21/2018 in [place] Office Visit from 04/28/2018 in [place] Office Visit from 04/13/2018 in [place]  PHQ-2 Total Score  0  0  3  0  0  PHQ-9 Total Score  --  --  7  --  --       Assessment and Plan: This patient is a 64 year old female with a history of depression and anxiety.  For the most part she is doing okay despite her son's problems.  She will continue Cymbalta 60 mg twice daily for depression and clonazepam 1 mg 3 times daily for anxiety.  She will return to see me in 3 months   [name], MD 05/02/2019, 3:16 PM

## 2019-05-04 ENCOUNTER — Other Ambulatory Visit: Payer: Self-pay | Admitting: Dermatology

## 2019-05-04 DIAGNOSIS — B078 Other viral warts: Secondary | ICD-10-CM | POA: Diagnosis not present

## 2019-05-04 DIAGNOSIS — D485 Neoplasm of uncertain behavior of skin: Secondary | ICD-10-CM | POA: Diagnosis not present

## 2019-05-04 DIAGNOSIS — L570 Actinic keratosis: Secondary | ICD-10-CM | POA: Diagnosis not present

## 2019-05-04 DIAGNOSIS — L820 Inflamed seborrheic keratosis: Secondary | ICD-10-CM | POA: Diagnosis not present

## 2019-05-11 DIAGNOSIS — M6281 Muscle weakness (generalized): Secondary | ICD-10-CM | POA: Diagnosis not present

## 2019-05-26 ENCOUNTER — Telehealth: Payer: Self-pay | Admitting: Family Medicine

## 2019-05-26 NOTE — Chronic Care Management (AMB) (Signed)
Chronic Care Management   Note  05/26/2019 Name: ADAIRA CENTOLA MRN: [id number] DOB: 10-Jun-1955  KYMANI LAURSEN is a 64 y.o. year old female who is a primary care patient of Stacks, Cletus Gash, MD. I reached out to [name] by phone today in response to a referral sent by Ms. [place] health plan.     Ms. Perot was given information about Chronic Care Management services today including:  1. CCM service includes personalized support from designated clinical staff supervised by her physician, including individualized plan of care and coordination with other care providers 2. 24/7 contact phone numbers for assistance for urgent and routine care needs. 3. Service will only be billed when office clinical staff spend 20 minutes or more in a month to coordinate care. 4. Only one practitioner may furnish and bill the service in a calendar month. 5. The patient may stop CCM services at any time (effective at the end of the month) by phone call to the office staff. 6. The patient will be responsible for cost sharing (co-pay) of up to 20% of the service fee (after annual deductible is met).  Patient agreed to services and verbal consent obtained.   Follow up plan: Telephone appointment with care management team member scheduled for:12/13/2019  Noreene Larsson, [place], [place], [place] [address] Direct Dial: [phone] [email or web address]: [email or web address]

## 2019-05-31 ENCOUNTER — Other Ambulatory Visit: Payer: Self-pay | Admitting: Family Medicine

## 2019-05-31 NOTE — Progress Notes (Deleted)
Cardiology Office Note  Date: 06/01/2019   ID: [name], [name] 12/15/1955, MRN [id number]  PCP:  Claretta Fraise, MD  Cardiologist:  Carlyle Dolly, MD Electrophysiologist:  None   Chief Complaint: ***  History of Present Illness: [name] is a 64 y.o. female with a history of atypical chest pain with a long history of same.  Nonobstructive CAD on cardiac catheterization 2008, nuclear stress test 2015 no ischemia.  CT chest for lung cancer screening showed aortic atherosclerosis with left main disease, LAD and left circumflex.  Other history includes HLD, HTN, positive for tobacco use  Last encounter October 13, 2018 with Dr. Harl Bowie via telemedicine.  At that time she complained pain/pressure in left chest.  Stated it can occur usually at rest, most commonly with laying down.  Can be diaphoretic, pain can last a few minutes, can be positional at times.  Similar to her chronic pain.  She was having dyspnea with walking to the mailbox x1 year plus coughing/wheezing  Past Medical History:  Diagnosis Date  . Allergy   . Arthritis   . Constipation    uses OTC meds with help  . COPD (chronic obstructive pulmonary disease) ([place])   . Coronary artery disease    minimal nonobstructive   . Depression   . GERD (gastroesophageal reflux disease)   . Hx of adenomatous polyp of colon 10/03/2014  . Hypercholesteremia   . Hypertension   . Panic attacks   . Perimenopausal   . Skin cancer   . Tobacco user     Past Surgical History:  Procedure Laterality Date  . ABDOMINAL HYSTERECTOMY    . COLONOSCOPY    . Right foot toe surgery    . UPPER GASTROINTESTINAL ENDOSCOPY    . vocal cord     polyp removal    Current Outpatient Medications  Medication Sig Dispense Refill  . albuterol (VENTOLIN HFA) 108 (90 Base) MCG/ACT inhaler Inhale 2 puffs into the lungs every 4 (four) hours as needed for wheezing or shortness of breath. 3 Inhaler 3  . amLODipine (NORVASC) 5 MG tablet TAKE 1  TABLET BY MOUTH  DAILY 90 tablet 3  . Ascorbic Acid (VITAMIN C) 1000 MG tablet Take 1,000 mg by mouth daily.    Marland Kitchen aspirin EC 81 MG tablet Take 81 mg by mouth daily.    Marland Kitchen atorvastatin (LIPITOR) 40 MG tablet TAKE 1 TABLET BY MOUTH AT  BEDTIME 90 tablet 3  . benzonatate (TESSALON) 100 MG capsule Take 1 capsule (100 mg total) by mouth every 8 (eight) hours. 21 capsule 0  . budesonide-formoterol (SYMBICORT) 160-4.5 MCG/ACT inhaler INHALE TWO PUFFS INTO THE LUNGS TWICE DAILY (Patient taking differently: Inhale 2 puffs into the lungs 2 (two) times a day. INHALE TWO PUFFS INTO THE LUNGS TWICE DAILY) 30.6 g 5  . clonazePAM (KLONOPIN) 1 MG tablet Take 1 tablet (1 mg total) by mouth 3 (three) times daily as needed. for anxiety 90 tablet 2  . cloNIDine (CATAPRES) 0.1 MG tablet TAKE ONE TABLET BY MOUTH AT BEDTIME. 30 tablet 5  . DULoxetine (CYMBALTA) 60 MG capsule Take 1 capsule (60 mg total) by mouth 2 (two) times daily. 60 capsule 2  . fish oil-omega-3 fatty acids 1000 MG capsule Take 1 g by mouth daily.     Marland Kitchen gabapentin (NEURONTIN) 300 MG capsule Take 300-600 mg by mouth See admin instructions. Take 1 capsule in the morning and 2 capsule at bedtime    . HYDROcodone-homatropine (HYCODAN)  5-1.5 MG/5ML syrup Take 5 mLs by mouth every 8 (eight) hours as needed for cough. 120 mL 0  . ipratropium-albuterol (DUONEB) 0.5-2.5 (3) MG/3ML SOLN Take 3 mLs by nebulization every 6 (six) hours as needed. 360 mL 5  . levofloxacin (LEVAQUIN) 500 MG tablet Take 1 tablet (500 mg total) by mouth daily. For 10 days 10 tablet 0  . lisinopril (ZESTRIL) 20 MG tablet TAKE 1 TABLET BY MOUTH  DAILY 90 tablet 3  . pantoprazole (PROTONIX) 40 MG tablet TAKE 1 TABLET BY MOUTH  TWICE DAILY 180 tablet 3  . SPIRIVA RESPIMAT 2.5 MCG/ACT AERS INHALE TWO PUFFS INTO THE LUNGS DAILY 4 g 2   No current facility-administered medications for this visit.   Allergies:  Lorcet 10-650 [hydrocodone-acetaminophen], Sulfa antibiotics, and Tape   Social  History: The patient  reports that she has been smoking cigarettes. She started smoking about 50 years ago. She has a 20.00 pack-year smoking history. She has never used smokeless tobacco. She reports that she does not drink alcohol or use drugs.   Family History: The patient's family history includes Alcohol abuse in her brother; Anxiety disorder in her brother and sister; Colon cancer (age of onset: 61) in her brother; Colon polyps in her brother, brother, sister, and sister; Depression in her brother and sister; Drug abuse in her son; Esophageal cancer in her sister; Heart attack in her brother, father, and mother; Heart disease in her mother.   ROS:  Please see the history of present illness. Otherwise, complete review of systems is positive for none.  All other systems are reviewed and negative.   Physical Exam: VS:  There were no vitals taken for this visit., BMI There is no height or weight on file to calculate BMI.  Wt Readings from Last 3 Encounters:  03/05/19 147 lb (66.7 kg)  02/10/19 150 lb (68 kg)  12/10/18 148 lb (67.1 kg)    General: Patient appears comfortable at rest. HEENT: Conjunctiva and lids normal, oropharynx clear with moist mucosa. Neck: Supple, no elevated JVP or carotid bruits, no thyromegaly. Lungs: Clear to auscultation, nonlabored breathing at rest. Cardiac: Regular rate and rhythm, no S3 or significant systolic murmur, no pericardial rub. Abdomen: Soft, nontender, no hepatomegaly, bowel sounds present, no guarding or rebound. Extremities: No pitting edema, distal pulses 2+. Skin: Warm and dry. Musculoskeletal: No kyphosis. Neuropsychiatric: Alert and oriented x3, affect grossly appropriate.  ECG:  An ECG dated *** was personally reviewed today and demonstrated:  ***  Recent Labwork: 03/05/2019: B Natriuretic Peptide 25.0; BUN 19; Creatinine, Ser 0.76; Hemoglobin 13.6; Platelets 303; Potassium 3.6; Sodium 142     Component Value Date/Time   CHOL 165  05/25/2017 1130   CHOL 187 08/30/2012 1317   TRIG 90 05/25/2017 1130   TRIG 149 11/15/2012 1157   TRIG 202 (H) 08/30/2012 1317   HDL 51 05/25/2017 1130   HDL 50 11/15/2012 1157   HDL 41 08/30/2012 1317   CHOLHDL 3.2 05/25/2017 1130   CHOLHDL 3.3 07/13/2013 0243   VLDL 36 07/13/2013 0243   LDLCALC 96 05/25/2017 1130   LDLCALC 81 11/15/2012 1157   LDLCALC 106 (H) 08/30/2012 1317    Other Studies Reviewed Today:  06/2006 cath FINDINGS: Aortic pressure 125/78 with a mean of 99. Left ventricular pressure 125/5 with an end-diastolic pressure of 10.  The left mainstem is angiographically normal. It bifurcates into the LAD and left circumflex.  The LAD is a large-caliber vessel that courses down to the LV  apex. It gives off a first diagonal branch and is a large vessel. There is no significant disease in the LAD or diagonal system.  Left circumflex is a large-caliber vessel. There is a small intermediate branch present. There are really no substantial obtuse marginal branches from the left circumflex present. The left circumflex courses down the AV groove and gives off a large posterolateral branch. The mid portion of the left circumflex has a long segment of 30-40% stenosis.  The right coronary artery is a large-caliber vessel. It terminates distally in a PDA and posterior AV segment which gives off two posterolateral branches. The mid portion of the right coronary artery provides a small RV marginal branch, and the proximal portion has a small conus branch present. There are minor luminal irregularities in the proximal portion of the right coronary artery, but there is no significant angiographic disease.  Left ventriculography performed in the 30-degree RAO projection demonstrates normal left ventricular systolic function with an LVEF of 55%. There is no mitral regurgitation.  ASSESSMENT: 1. Nonobstructive coronary artery disease,  predominantly involving the left circumflex system. 2. Normal left ventricular function. 3. Intracoronary vascular ultrasound demonstration of minimal plaque in the right coronary artery.  PLAN: Ms. Kooi will require aggressive medical therapy. She needs tobacco cessation and progressive statin therapy. She will receive her statin therapy through the [facility] protocol. She should be on a daily aspirin as well. She will be a candidate for early discharge as her chest pain appears to have been noncardiac, and she has had no further pain.   Assessment and Plan:  1. Chest pain, unspecified type   2. Essential hypertension   3. Mixed hyperlipidemia    1. Chest pain, unspecified type CT for lung cancer screening showed left main disease along with LAD and LCx disease.  2. Essential hypertension ***  3. Mixed hyperlipidemia ***  Medication Adjustments/Labs and Tests Ordered: Current medicines are reviewed at length with the patient today.  Concerns regarding medicines are outlined above.   Disposition: Follow-up with Dr. Harl Bowie or APP  Signed, Levell July, NP 06/01/2019 7:56 AM    [facility] [place], [place], [place] [address] Phone: [phone]; Fax: [phone]

## 2019-06-01 ENCOUNTER — Ambulatory Visit: Payer: Medicare Other | Admitting: Family Medicine

## 2019-06-01 ENCOUNTER — Encounter: Payer: Self-pay | Admitting: *Deleted

## 2019-06-06 ENCOUNTER — Other Ambulatory Visit: Payer: Self-pay

## 2019-06-06 ENCOUNTER — Ambulatory Visit (INDEPENDENT_AMBULATORY_CARE_PROVIDER_SITE_OTHER): Payer: Medicare Other | Admitting: Dermatology

## 2019-06-06 DIAGNOSIS — L739 Follicular disorder, unspecified: Secondary | ICD-10-CM

## 2019-06-06 DIAGNOSIS — D229 Melanocytic nevi, unspecified: Secondary | ICD-10-CM

## 2019-06-06 DIAGNOSIS — D485 Neoplasm of uncertain behavior of skin: Secondary | ICD-10-CM

## 2019-06-06 DIAGNOSIS — L570 Actinic keratosis: Secondary | ICD-10-CM | POA: Diagnosis not present

## 2019-06-06 DIAGNOSIS — D225 Melanocytic nevi of trunk: Secondary | ICD-10-CM | POA: Diagnosis not present

## 2019-06-06 DIAGNOSIS — D492 Neoplasm of unspecified behavior of bone, soft tissue, and skin: Secondary | ICD-10-CM

## 2019-06-06 DIAGNOSIS — C4441 Basal cell carcinoma of skin of scalp and neck: Secondary | ICD-10-CM

## 2019-06-06 DIAGNOSIS — L820 Inflamed seborrheic keratosis: Secondary | ICD-10-CM | POA: Diagnosis not present

## 2019-06-06 MED ORDER — MUPIROCIN 2 % EX OINT: 1.0000 "application " | TOPICAL_OINTMENT | Freq: Two times a day (BID) | CUTANEOUS | 1 refills | Status: DC

## 2019-06-06 NOTE — Patient Instructions (Addendum)

## 2019-06-07 ENCOUNTER — Encounter: Payer: Self-pay | Admitting: Dermatology

## 2019-06-07 ENCOUNTER — Ambulatory Visit: Payer: Medicare Other | Admitting: Family Medicine

## 2019-06-07 NOTE — Progress Notes (Deleted)
Cardiology Office Note  Date: 06/07/2019   ID: Kerry, Macdonald 1955-07-28, MRN [id number]  PCP:  Claretta Fraise, MD  Cardiologist:  Carlyle Dolly, MD Electrophysiologist:  None   Chief Complaint: Follow-up atypical chest pain/CAD, HTN.   History of Present Illness: [name] is a 64 y.o. female with a long history of atypical chest pain.  Status post cardiac cath 2008 with nonobstructive CAD, nuclear stress test in 2015 with no ischemia, CT for lung cancer screening showed aortic atherosclerosis, LM disease, LAD and circumflex. Long hx of smoking 1/2 ppd x 40 years. Current smoker / emphysema.  Last saw Dr. Harl Bowie October 13, 2018.  Complaining of left chest pain/pressure, usually occurring at rest most commonly laying down when occurring.  Sometimes associated with diaphoresis, lasting a few minutes, could be positional at times.  Past Medical History:  Diagnosis Date  . Allergy   . Arthritis   . Atypical mole 06/05/2003   slight-mod-Left scapula (WS)  . Atypical nevus 07/26/2003   persist. dysp- Left scapula (WS)  . Basal cell carcinoma 06/05/2003   RIght chest (CX35FU)  . Basal cell carcinoma 05/22/2004   superificial- RIght upper back- (CX35FU)  . Basal cell carcinoma 11/27/2004   beside R nostril-(MOHS), below right outer nose (MOHS)  . Basal cell carcinoma 11/08/2009   left post shoulder -(txpbx)  . Basal cell carcinoma 05/24/2012   ulcerated- Left post shoulder- (CX35FU), ulcerated-mid forehead (EXC )  . Basal cell carcinoma 06/24/2012   superificial- Right shoulder - (txpbx)   . Basal cell carcinoma 07/29/2012    sclerosis- mid forehead (MOHS)  . Constipation    uses OTC meds with help  . COPD (chronic obstructive pulmonary disease) ([place])   . Coronary artery disease    minimal nonobstructive   . Depression   . GERD (gastroesophageal reflux disease)   . Hx of adenomatous polyp of colon 10/03/2014  . Hypercholesteremia   . Hypertension   . Panic  attacks   . Perimenopausal   . Skin cancer   . Squamous cell carcinoma of skin 06/05/2003   Right Temple(CX35FU)  . Squamous cell carcinoma of skin 05/22/2004   in situ- Left upperarm (CX35FU)  . Squamous cell carcinoma of skin 11/27/2004   in situ- above Left elbow (CX35FU)  . Squamous cell carcinoma of skin 03/04/2006   in situ- Left sideburn (CX35FU)  . Squamous cell carcinoma of skin 11/26/2006   in situ- mid brow (Cx35FU)  . Squamous cell carcinoma of skin 05/24/2012   in situ- Left chest- (CX35FU)  . Squamous cell carcinoma of skin 04/05/2013   in situ- Left nose (Cx35FU)  . Squamous cell carcinoma of skin 08/17/2013   well diff-above Left eye (txpbx)  . Squamous cell carcinoma of skin 07/30/2016   in situ- right chest sup (Cx35FU)  . Squamous cell carcinoma of skin 05/12/2017   in situ- RIght shoulder (CX35FU), in situ- Left forehead (CX35FU)  . Squamous cell carcinoma of skin 11/23/2018   in situ- Right mid shin, inner- (MOHS)  . Tobacco user     Past Surgical History:  Procedure Laterality Date  . ABDOMINAL HYSTERECTOMY    . COLONOSCOPY    . Right foot toe surgery    . UPPER GASTROINTESTINAL ENDOSCOPY    . vocal cord     polyp removal    Current Outpatient Medications  Medication Sig Dispense Refill  . albuterol (VENTOLIN HFA) 108 (90 Base) MCG/ACT inhaler Inhale 2 puffs into the  lungs every 4 (four) hours as needed for wheezing or shortness of breath. 3 Inhaler 3  . amLODipine (NORVASC) 5 MG tablet TAKE 1 TABLET BY MOUTH  DAILY 90 tablet 3  . Ascorbic Acid (VITAMIN C) 1000 MG tablet Take 1,000 mg by mouth daily.    Marland Kitchen aspirin EC 81 MG tablet Take 81 mg by mouth daily.    Marland Kitchen atorvastatin (LIPITOR) 40 MG tablet TAKE 1 TABLET BY MOUTH AT  BEDTIME 90 tablet 3  . benzonatate (TESSALON) 100 MG capsule Take 1 capsule (100 mg total) by mouth every 8 (eight) hours. 21 capsule 0  . budesonide-formoterol (SYMBICORT) 160-4.5 MCG/ACT inhaler INHALE TWO PUFFS INTO THE LUNGS  TWICE DAILY (Patient taking differently: Inhale 2 puffs into the lungs 2 (two) times a day. INHALE TWO PUFFS INTO THE LUNGS TWICE DAILY) 30.6 g 5  . clonazePAM (KLONOPIN) 1 MG tablet Take 1 tablet (1 mg total) by mouth 3 (three) times daily as needed. for anxiety 90 tablet 2  . cloNIDine (CATAPRES) 0.1 MG tablet TAKE ONE TABLET BY MOUTH AT BEDTIME. 30 tablet 5  . DULoxetine (CYMBALTA) 60 MG capsule Take 1 capsule (60 mg total) by mouth 2 (two) times daily. 60 capsule 2  . fish oil-omega-3 fatty acids 1000 MG capsule Take 1 g by mouth daily.     Marland Kitchen gabapentin (NEURONTIN) 300 MG capsule Take 300-600 mg by mouth See admin instructions. Take 1 capsule in the morning and 2 capsule at bedtime    . HYDROcodone-homatropine (HYCODAN) 5-1.5 MG/5ML syrup Take 5 mLs by mouth every 8 (eight) hours as needed for cough. 120 mL 0  . ipratropium-albuterol (DUONEB) 0.5-2.5 (3) MG/3ML SOLN Take 3 mLs by nebulization every 6 (six) hours as needed. 360 mL 5  . levofloxacin (LEVAQUIN) 500 MG tablet Take 1 tablet (500 mg total) by mouth daily. For 10 days 10 tablet 0  . lisinopril (ZESTRIL) 20 MG tablet TAKE 1 TABLET BY MOUTH  DAILY 90 tablet 3  . mupirocin ointment (BACTROBAN) 2 % Place 1 application into the nose 2 (two) times daily. 22 g 1  . pantoprazole (PROTONIX) 40 MG tablet TAKE 1 TABLET BY MOUTH  TWICE DAILY 180 tablet 3  . SPIRIVA RESPIMAT 2.5 MCG/ACT AERS INHALE TWO PUFFS INTO THE LUNGS DAILY 4 g 2   No current facility-administered medications for this visit.   Allergies:  Lorcet 10-650 [hydrocodone-acetaminophen], Sulfa antibiotics, and Tape   Social History: The patient  reports that she has been smoking cigarettes. She started smoking about 50 years ago. She has a 20.00 pack-year smoking history. She has never used smokeless tobacco. She reports that she does not drink alcohol or use drugs.   Family History: The patient's family history includes Alcohol abuse in her brother; Anxiety disorder in her  brother and sister; Colon cancer (age of onset: 57) in her brother; Colon polyps in her brother, brother, sister, and sister; Depression in her brother and sister; Drug abuse in her son; Esophageal cancer in her sister; Heart attack in her brother, father, and mother; Heart disease in her mother.   ROS:  Please see the history of present illness. Otherwise, complete review of systems is positive for none.  All other systems are reviewed and negative.   Physical Exam: VS:  There were no vitals taken for this visit., BMI There is no height or weight on file to calculate BMI.  Wt Readings from Last 3 Encounters:  03/05/19 147 lb (66.7 kg)  02/10/19 150  lb (68 kg)  12/10/18 148 lb (67.1 kg)    General: Patient appears comfortable at rest. HEENT: Conjunctiva and lids normal, oropharynx clear with moist mucosa. Neck: Supple, no elevated JVP or carotid bruits, no thyromegaly. Lungs: Clear to auscultation, nonlabored breathing at rest. Cardiac: Regular rate and rhythm, no S3 or significant systolic murmur, no pericardial rub. Abdomen: Soft, nontender, no hepatomegaly, bowel sounds present, no guarding or rebound. Extremities: No pitting edema, distal pulses 2+. Skin: Warm and dry. Musculoskeletal: No kyphosis. Neuropsychiatric: Alert and oriented x3, affect grossly appropriate.  ECG:  {EKG/Telemetry Strips [phone]}  Recent Labwork: 03/05/2019: B Natriuretic Peptide 25.0; BUN 19; Creatinine, Ser 0.76; Hemoglobin 13.6; Platelets 303; Potassium 3.6; Sodium 142     Component Value Date/Time   CHOL 165 05/25/2017 1130   CHOL 187 08/30/2012 1317   TRIG 90 05/25/2017 1130   TRIG 149 11/15/2012 1157   TRIG 202 (H) 08/30/2012 1317   HDL 51 05/25/2017 1130   HDL 50 11/15/2012 1157   HDL 41 08/30/2012 1317   CHOLHDL 3.2 05/25/2017 1130   CHOLHDL 3.3 07/13/2013 0243   VLDL 36 07/13/2013 0243   LDLCALC 96 05/25/2017 1130   LDLCALC 81 11/15/2012 1157   LDLCALC 106 (H) 08/30/2012 1317     Other Studies Reviewed Today:   06/2006 cath FINDINGS: Aortic pressure 125/78 with a mean of 99. Left ventricular pressure 125/5 with an end-diastolic pressure of 10.  The left mainstem is angiographically normal. It bifurcates into the LAD and left circumflex.  The LAD is a large-caliber vessel that courses down to the LV apex. It gives off a first diagonal branch and is a large vessel. There is no significant disease in the LAD or diagonal system.  Left circumflex is a large-caliber vessel. There is a small intermediate branch present. There are really no substantial obtuse marginal branches from the left circumflex present. The left circumflex courses down the AV groove and gives off a large posterolateral branch. The mid portion of the left circumflex has a long segment of 30-40% stenosis.  The right coronary artery is a large-caliber vessel. It terminates distally in a PDA and posterior AV segment which gives off two posterolateral branches. The mid portion of the right coronary artery provides a small RV marginal branch, and the proximal portion has a small conus branch present. There are minor luminal irregularities in the proximal portion of the right coronary artery, but there is no significant angiographic disease.  Left ventriculography performed in the 30-degree RAO projection demonstrates normal left ventricular systolic function with an LVEF of 55%. There is no mitral regurgitation.  ASSESSMENT: 1. Nonobstructive coronary artery disease, predominantly involving the left circumflex system. 2. Normal left ventricular function. 3. Intracoronary vascular ultrasound demonstration of minimal plaque in the right coronary artery.  PLAN: Ms. Rojo will require aggressive medical therapy. She needs tobacco cessation and progressive statin therapy. She will receive her statin therapy through the [facility]URN  protocol. She should be on a daily aspirin as well. She will be a candidate for early discharge as her chest pain appears to have been noncardiac, and she has had no further pain.   Jan 2020 nuclear stress  There was no ST segment deviation noted during stress.  Defect 1: There is a medium defect of mild severity present in the basal inferior, mid inferior and apical inferior location. This is likely due to soft tissue attenuation. No evidence of ischemia.  This is a low risk study.  Nuclear stress  EF: 58%.  Assessment and Plan:  1. CAD in native artery   2. Chest pain, unspecified type   3. Essential hypertension    1. CAD in native artery ***  2. Chest pain, unspecified type ***  3. Essential hypertension  *** Medication Adjustments/Labs and Tests Ordered: Current medicines are reviewed at length with the patient today.  Concerns regarding medicines are outlined above.   Disposition: Follow-up with Dr. Harl Bowie or APP   Signed, Levell July, NP 06/07/2019 2:14 PM    [facility] [place], [place], [place] [address] Phone: [phone]; Fax: [phone]

## 2019-06-09 DIAGNOSIS — Z79899 Other long term (current) drug therapy: Secondary | ICD-10-CM | POA: Diagnosis not present

## 2019-06-09 DIAGNOSIS — G43909 Migraine, unspecified, not intractable, without status migrainosus: Secondary | ICD-10-CM | POA: Diagnosis not present

## 2019-06-09 DIAGNOSIS — M545 Low back pain: Secondary | ICD-10-CM | POA: Diagnosis not present

## 2019-06-10 ENCOUNTER — Telehealth: Payer: Self-pay | Admitting: *Deleted

## 2019-06-10 DIAGNOSIS — M6281 Muscle weakness (generalized): Secondary | ICD-10-CM | POA: Diagnosis not present

## 2019-06-10 NOTE — Telephone Encounter (Signed)
-----   Message from [name], MD sent at 06/09/2019  9:27 PM EDT ----- Biopsy right neck = BCC; 30 min with Dr. Darene Lamer

## 2019-06-10 NOTE — Telephone Encounter (Signed)
PHONE CALL TO PATIENT LEFT MESSAGE TO CALL us BACK SO WE CAN GIVE HER PATHOLOGY RESULTS AND SCHEDULE 30 MINUTE SURGERY WITH DR. Denna Haggard

## 2019-06-10 NOTE — Telephone Encounter (Signed)
Patient informed of pathology results and scheduled 30 minute surgery with Dr. Denna Haggard on 06/30/2019 at 2:00 for bcc on right neck

## 2019-06-15 ENCOUNTER — Encounter: Payer: Self-pay | Admitting: Cardiology

## 2019-06-15 ENCOUNTER — Other Ambulatory Visit: Payer: Self-pay

## 2019-06-15 ENCOUNTER — Telehealth (INDEPENDENT_AMBULATORY_CARE_PROVIDER_SITE_OTHER): Payer: Medicare Other | Admitting: Cardiology

## 2019-06-15 VITALS — BP 130/88 | Ht 65.0 in | Wt 150.0 lb

## 2019-06-15 DIAGNOSIS — J449 Chronic obstructive pulmonary disease, unspecified: Secondary | ICD-10-CM | POA: Diagnosis not present

## 2019-06-15 DIAGNOSIS — I2510 Atherosclerotic heart disease of native coronary artery without angina pectoris: Secondary | ICD-10-CM | POA: Diagnosis not present

## 2019-06-15 DIAGNOSIS — I10 Essential (primary) hypertension: Secondary | ICD-10-CM | POA: Diagnosis not present

## 2019-06-15 DIAGNOSIS — R0789 Other chest pain: Secondary | ICD-10-CM

## 2019-06-15 DIAGNOSIS — R079 Chest pain, unspecified: Secondary | ICD-10-CM

## 2019-06-15 NOTE — Patient Instructions (Signed)

## 2019-06-15 NOTE — Progress Notes (Signed)
Virtual Visit via Telephone Note   This visit type was conducted due to national recommendations for restrictions regarding the COVID-19 Pandemic (e.g. social distancing) in an effort to limit this patient's exposure and mitigate transmission in our community.  Due to her co-morbid illnesses, this patient is at least at moderate risk for complications without adequate follow up.  This format is felt to be most appropriate for this patient at this time.  The patient did not have access to video technology/had technical difficulties with video requiring transitioning to audio format only (telephone).  All issues noted in this document were discussed and addressed.  No physical exam could be performed with this format.  Please refer to the patient's chart for her  consent to telehealth for [facility].   The patient was identified using 2 identifiers.  Date:  06/15/2019   ID:  [name], DOB Dec 22, 1955, MRN [id number]  Patient Location: Home Provider Location: Office  PCP:  Claretta Fraise, MD  Cardiologist:  Carlyle Dolly, MD  Electrophysiologist:  None   Evaluation Performed:  Follow-Up Visit  Chief Complaint:  Follow up  History of Present Illness:    [name] is a 64 y.o. female seen today for follow up of the following medical problems.   1. Atypical chest pain/CAD - long history of atypical chest pain - cath 2008 nonobstrucive CAD - 07/2013 nuclear stress: no ischemia 01/2018 CT for lung cancer screening showed aortic atherosclerosis, with LM disease, LAD, and LCX   CAD risk factors: hyperlipidemia, HTN, +tobacco, both parents MIs in 64 to 25s, brother MI in his 93s  Jan 2020 nuclear stress no ischemia - no recent chest pain.    2. COPD - followed by pulmonary  - +wheezing, +cough recently with some SOB   3. HTN - she is compliant with meds    upcoming 2nd COVID shot   The patient does not have symptoms concerning for COVID-19 infection  (fever, chills, cough, or new shortness of breath).    Past Medical History:  Diagnosis Date  . Allergy   . Arthritis   . Atypical mole 06/05/2003   slight-mod-Left scapula (WS)  . Atypical nevus 07/26/2003   persist. dysp- Left scapula (WS)  . Basal cell carcinoma 06/05/2003   RIght chest (CX35FU)  . Basal cell carcinoma 05/22/2004   superificial- RIght upper back- (CX35FU)  . Basal cell carcinoma 11/27/2004   beside R nostril-(MOHS), below right outer nose (MOHS)  . Basal cell carcinoma 11/08/2009   left post shoulder -(txpbx)  . Basal cell carcinoma 05/24/2012   ulcerated- Left post shoulder- (CX35FU), ulcerated-mid forehead (EXC )  . Basal cell carcinoma 06/24/2012   superificial- Right shoulder - (txpbx)   . Basal cell carcinoma 07/29/2012    sclerosis- mid forehead (MOHS)  . Constipation    uses OTC meds with help  . COPD (chronic obstructive pulmonary disease) ([place])   . Coronary artery disease    minimal nonobstructive   . Depression   . GERD (gastroesophageal reflux disease)   . Hx of adenomatous polyp of colon 10/03/2014  . Hypercholesteremia   . Hypertension   . Panic attacks   . Perimenopausal   . Skin cancer   . Squamous cell carcinoma of skin 06/05/2003   Right Temple(CX35FU)  . Squamous cell carcinoma of skin 05/22/2004   in situ- Left upperarm (CX35FU)  . Squamous cell carcinoma of skin 11/27/2004   in situ- above Left elbow (CX35FU)  . Squamous cell  carcinoma of skin 03/04/2006   in situ- Left sideburn (CX35FU)  . Squamous cell carcinoma of skin 11/26/2006   in situ- mid brow (Cx35FU)  . Squamous cell carcinoma of skin 05/24/2012   in situ- Left chest- (CX35FU)  . Squamous cell carcinoma of skin 04/05/2013   in situ- Left nose (Cx35FU)  . Squamous cell carcinoma of skin 08/17/2013   well diff-above Left eye (txpbx)  . Squamous cell carcinoma of skin 07/30/2016   in situ- right chest sup (Cx35FU)  . Squamous cell carcinoma of skin 05/12/2017   in  situ- RIght shoulder (CX35FU), in situ- Left forehead (CX35FU)  . Squamous cell carcinoma of skin 11/23/2018   in situ- Right mid shin, inner- (MOHS)  . Tobacco user    Past Surgical History:  Procedure Laterality Date  . ABDOMINAL HYSTERECTOMY    . COLONOSCOPY    . Right foot toe surgery    . UPPER GASTROINTESTINAL ENDOSCOPY    . vocal cord     polyp removal     Current Meds  Medication Sig  . albuterol (VENTOLIN HFA) 108 (90 Base) MCG/ACT inhaler Inhale 2 puffs into the lungs every 4 (four) hours as needed for wheezing or shortness of breath.  Marland Kitchen amLODipine (NORVASC) 5 MG tablet TAKE 1 TABLET BY MOUTH  DAILY  . Ascorbic Acid (VITAMIN C) 1000 MG tablet Take 1,000 mg by mouth daily.  Marland Kitchen aspirin EC 81 MG tablet Take 81 mg by mouth daily.  Marland Kitchen atorvastatin (LIPITOR) 40 MG tablet TAKE 1 TABLET BY MOUTH AT  BEDTIME  . budesonide-formoterol (SYMBICORT) 160-4.5 MCG/ACT inhaler INHALE TWO PUFFS INTO THE LUNGS TWICE DAILY (Patient taking differently: Inhale 2 puffs into the lungs 2 (two) times a day. INHALE TWO PUFFS INTO THE LUNGS TWICE DAILY)  . clonazePAM (KLONOPIN) 1 MG tablet Take 1 tablet (1 mg total) by mouth 3 (three) times daily as needed. for anxiety  . cloNIDine (CATAPRES) 0.1 MG tablet TAKE ONE TABLET BY MOUTH AT BEDTIME.  . DULoxetine (CYMBALTA) 60 MG capsule Take 1 capsule (60 mg total) by mouth 2 (two) times daily.  . fish oil-omega-3 fatty acids 1000 MG capsule Take 1 g by mouth daily.   Marland Kitchen gabapentin (NEURONTIN) 300 MG capsule Take 300-600 mg by mouth See admin instructions. Take 1 capsule in the morning and 2 capsule at bedtime  . ipratropium-albuterol (DUONEB) 0.5-2.5 (3) MG/3ML SOLN Take 3 mLs by nebulization every 6 (six) hours as needed.  Marland Kitchen lisinopril (ZESTRIL) 20 MG tablet TAKE 1 TABLET BY MOUTH  DAILY  . mupirocin ointment (BACTROBAN) 2 % Place 1 application into the nose 2 (two) times daily.  . pantoprazole (PROTONIX) 40 MG tablet TAKE 1 TABLET BY MOUTH  TWICE DAILY  .  SPIRIVA RESPIMAT 2.5 MCG/ACT AERS INHALE TWO PUFFS INTO THE LUNGS DAILY     Allergies:   Lorcet 10-650 [hydrocodone-acetaminophen], Sulfa antibiotics, and Tape   Social History   Tobacco Use  . Smoking status: Current Every Day Smoker    Packs/day: 0.50    Years: 40.00    Pack years: 20.00    Types: Cigarettes    Start date: 05/01/1969  . Smokeless tobacco: Never Used  . Tobacco comment: would like information. has tried chantix and wellbutrin  Substance Use Topics  . Alcohol use: No    Alcohol/week: 0.0 standard drinks    Comment: Per pt no 04-02-15.  . Drug use: No    Comment: per pt, Cocaine was in her system October  and November 2016 when she went to the pain clinic 04-02-15.     Family Hx: The patient's family history includes Alcohol abuse in her brother; Anxiety disorder in her brother and sister; Colon cancer (age of onset: 33) in her brother; Colon polyps in her brother, brother, sister, and sister; Depression in her brother and sister; Drug abuse in her son; Esophageal cancer in her sister; Heart attack in her brother, father, and mother; Heart disease in her mother. There is no history of Rectal cancer or Stomach cancer.  ROS:   Please see the history of present illness.     All other systems reviewed and are negative.   Prior CV studies:   The following studies were reviewed today:  06/2006 cath FINDINGS: Aortic pressure 125/78 with a mean of 99. Left ventricular pressure 125/5 with an end-diastolic pressure of 10.  The left mainstem is angiographically normal. It bifurcates into the LAD and left circumflex.  The LAD is a large-caliber vessel that courses down to the LV apex. It gives off a first diagonal Lilliona Blakeney and is a large vessel. There is no significant disease in the LAD or diagonal system.  Left circumflex is a large-caliber vessel. There is a small intermediate Jumana Paccione present. There are really no substantial obtuse marginal branches  from the left circumflex present. The left circumflex courses down the AV groove and gives off a large posterolateral Virat Prather. The mid portion of the left circumflex has a long segment of 30-40% stenosis.  The right coronary artery is a large-caliber vessel. It terminates distally in a PDA and posterior AV segment which gives off two posterolateral branches. The mid portion of the right coronary artery provides a small RV marginal Nawaf Strange, and the proximal portion has a small conus Cayce Quezada present. There are minor luminal irregularities in the proximal portion of the right coronary artery, but there is no significant angiographic disease.  Left ventriculography performed in the 30-degree RAO projection demonstrates normal left ventricular systolic function with an LVEF of 55%. There is no mitral regurgitation.  ASSESSMENT: 1. Nonobstructive coronary artery disease, predominantly involving the left circumflex system. 2. Normal left ventricular function. 3. Intracoronary vascular ultrasound demonstration of minimal plaque in the right coronary artery.  PLAN: Ms. Mcclarin will require aggressive medical therapy. She needs tobacco cessation and progressive statin therapy. She will receive her statin therapy through the [facility] protocol. She should be on a daily aspirin as well. She will be a candidate for early discharge as her chest pain appears to have been noncardiac, and she has had no further pain.   Jan 2020 nuclear stress  There was no ST segment deviation noted during stress.  Defect 1: There is a medium defect of mild severity present in the basal inferior, mid inferior and apical inferior location. This is likely due to soft tissue attenuation. No evidence of ischemia.  This is a low risk study.  Nuclear stress EF: 58%.  Labs/Other Tests and Data Reviewed:    EKG:  No ECG reviewed.  Recent Labs: 03/05/2019: B Natriuretic  Peptide 25.0; BUN 19; Creatinine, Ser 0.76; Hemoglobin 13.6; Platelets 303; Potassium 3.6; Sodium 142   Recent Lipid Panel Lab Results  Component Value Date/Time   CHOL 165 05/25/2017 11:30 AM   CHOL 187 08/30/2012 01:17 PM   TRIG 90 05/25/2017 11:30 AM   TRIG 149 11/15/2012 11:57 AM   TRIG 202 (H) 08/30/2012 01:17 PM   HDL 51 05/25/2017 11:30 AM   HDL 50 11/15/2012  11:57 AM   HDL 41 08/30/2012 01:17 PM   CHOLHDL 3.2 05/25/2017 11:30 AM   CHOLHDL 3.3 07/13/2013 02:43 AM   LDLCALC 96 05/25/2017 11:30 AM   LDLCALC 81 11/15/2012 11:57 AM   LDLCALC 106 (H) 08/30/2012 01:17 PM    Wt Readings from Last 3 Encounters:  06/15/19 150 lb (68 kg)  03/05/19 147 lb (66.7 kg)  02/10/19 150 lb (68 kg)     Objective:    Vital Signs:  BP 130/88   Ht 5\' 5"  (1.651 m)   Wt 150 lb (68 kg)   BMI 24.96 kg/m    Normal affect. Normal speech pattern and tone. Comfortable, no apparent distress. No audible signs of sob or wheezing.   ASSESSMENT & PLAN:    1. CADAtypical chest pain - long history of atypical chest pain - nonobstructive CAD by cath 2008, negative nuclear stress 2015 and more recently Jan 2020 -no recent chest pain, continue to monitor - continue medical therapy  2. COPD  - symptoms of likely exacerbation, asked her to contact her pcp  3. HTN -at goal, continue current meds     COVID-19 Education: The signs and symptoms of COVID-19 were discussed with the patient and how to seek care for testing (follow up with PCP or arrange E-visit).  The importance of social distancing was discussed today.  Time:   Today, I have spent 17 minutes with the patient with telehealth technology discussing the above problems.     Medication Adjustments/Labs and Tests Ordered: Current medicines are reviewed at length with the patient today.  Concerns regarding medicines are outlined above.   Tests Ordered: No orders of the defined types were placed in this encounter.   Medication  Changes: No orders of the defined types were placed in this encounter.   Follow Up:  Either In Person or Virtual in 6 month(s)  Signed, Carlyle Dolly, MD  06/15/2019 10:44 AM    [place]

## 2019-06-27 ENCOUNTER — Other Ambulatory Visit: Payer: Self-pay | Admitting: Family Medicine

## 2019-06-27 DIAGNOSIS — J439 Emphysema, unspecified: Secondary | ICD-10-CM

## 2019-06-30 ENCOUNTER — Encounter: Payer: Self-pay | Admitting: Dermatology

## 2019-06-30 ENCOUNTER — Other Ambulatory Visit: Payer: Self-pay | Admitting: Family Medicine

## 2019-06-30 ENCOUNTER — Other Ambulatory Visit: Payer: Self-pay

## 2019-06-30 ENCOUNTER — Ambulatory Visit (INDEPENDENT_AMBULATORY_CARE_PROVIDER_SITE_OTHER): Payer: Medicare Other | Admitting: Dermatology

## 2019-06-30 DIAGNOSIS — C4441 Basal cell carcinoma of skin of scalp and neck: Secondary | ICD-10-CM | POA: Diagnosis not present

## 2019-06-30 NOTE — Patient Instructions (Signed)

## 2019-06-30 NOTE — Progress Notes (Addendum)
   Follow-Up Visit   Subjective  [name] is a 64 y.o. female who presents for the following: Procedure (Here for [facility], nodular on right neck.).  BCC Location: Right neck Duration:  Quality:  Associated Signs/Symptoms: Modifying Factors:  Severity:  Timing: Context: For treatment  The following portions of the chart were reviewed this encounter and updated as appropriate:     Objective  Well appearing patient in no apparent distress; mood and affect are within normal limits.  A focused examination was performed including head and neck. Relevant physical exam findings are noted in the Assessment and Plan.   Assessment & Plan  Basal cell carcinoma (BCC) of skin of neck Right Anterior Neck  Destruction of lesion  Destruction method: electrodesiccation and curettage   Informed consent: discussed and consent obtained   Curettage cycles:  4 Lesion length (cm):  1.6 Lesion width (cm):  1 Margin per side (cm):  0 Final wound size (cm):  1.6 Outcome: patient tolerated procedure well with no complications   Additional details:  Inital curettage x3 showed some dermal invasion, ED, recuret, base inoculated with parenteral 5FU.

## 2019-07-07 ENCOUNTER — Other Ambulatory Visit: Payer: Self-pay

## 2019-07-07 ENCOUNTER — Telehealth (HOSPITAL_COMMUNITY): Payer: Medicare Other | Admitting: Psychiatry

## 2019-07-12 DIAGNOSIS — M6281 Muscle weakness (generalized): Secondary | ICD-10-CM | POA: Diagnosis not present

## 2019-07-14 DIAGNOSIS — R490 Dysphonia: Secondary | ICD-10-CM | POA: Diagnosis not present

## 2019-07-14 DIAGNOSIS — K219 Gastro-esophageal reflux disease without esophagitis: Secondary | ICD-10-CM | POA: Diagnosis not present

## 2019-07-18 NOTE — Progress Notes (Addendum)
   Follow-Up Visit   Subjective  [name] is a 64 y.o. female who presents for the following: Skin Problem (Check spots on back. Large SK that are very irritated and raw.  Check spot on left inner cheek. Rought feeling and itchy per patient. ).    The following portions of the chart were reviewed this encounter and updated as appropriate: Tobacco  Allergies  Meds  Problems  Med Hx  Surg Hx  Fam Hx      Objective  Well appearing patient in no apparent distress; mood and affect are within normal limits.  A full examination was performed including scalp, head, eyes, ears, nose, lips, neck, chest, axillae, abdomen, back, buttocks, bilateral upper extremities, bilateral lower extremities, hands, feet, fingers, toes, fingernails, and toenails. All findings within normal limits unless otherwise noted below.   Assessment & Plan  AK (actinic keratosis) Left Malar Cheek  Destruction of lesion - Left Malar Cheek  Destruction method: cryotherapy   Informed consent: discussed and consent obtained   Lesion destroyed using liquid nitrogen: Yes   Region frozen until ice ball extended beyond lesion: Yes   Post-procedure details: wound care instructions given    Folliculitis Left Soft Triangle  mupirocin ointment (BACTROBAN) 2 % - Left Soft Triangle  Neoplasm of skin (2) Right Anterior Neck  Skin / nail biopsy Type of biopsy: tangential   Informed consent: discussed and consent obtained   Timeout: patient name, date of birth, surgical site, and procedure verified   Procedure prep:  Patient was prepped and draped in usual sterile fashion Prep type:  Chlorhexidine Anesthesia: the lesion was anesthetized in a standard fashion   Anesthetic:  1% lidocaine w/ epinephrine 1-100,000 local infiltration Instrument used: flexible razor blade   Hemostasis achieved with: ferric subsulfate   Outcome: patient tolerated procedure well   Post-procedure details: sterile dressing applied and  wound care instructions given   Dressing type: petrolatum   Additional details:  Patient identified lesion of concern.  Lesion identified by physician.  Specimen 1 - Surgical pathology Differential Diagnosis: scc vs bcc Check Margins: No  Left Lower Back  Skin / nail biopsy Type of biopsy: tangential   Informed consent: discussed and consent obtained   Timeout: patient name, date of birth, surgical site, and procedure verified   Procedure prep:  Patient was prepped and draped in usual sterile fashion Prep type:  Chlorhexidine Anesthesia: the lesion was anesthetized in a standard fashion   Anesthetic:  1% lidocaine w/ epinephrine 1-100,000 local infiltration Instrument used: flexible razor blade   Hemostasis achieved with: ferric subsulfate   Outcome: patient tolerated procedure well   Post-procedure details: sterile dressing applied and wound care instructions given   Dressing type: petrolatum   Additional details:  Patient identified lesion of concern.  Lesion identified by physician.  Specimen 2 - Surgical pathology Differential Diagnosis: scc vs bcc Check Margins: No  Nevus (2) Left Lower Back; Right Lower Back  No removal indicated.

## 2019-07-26 ENCOUNTER — Other Ambulatory Visit: Payer: Self-pay | Admitting: Family Medicine

## 2019-07-26 DIAGNOSIS — J439 Emphysema, unspecified: Secondary | ICD-10-CM

## 2019-07-27 ENCOUNTER — Telehealth: Payer: Self-pay | Admitting: Family Medicine

## 2019-07-27 DIAGNOSIS — F332 Major depressive disorder, recurrent severe without psychotic features: Secondary | ICD-10-CM

## 2019-07-27 DIAGNOSIS — Z1231 Encounter for screening mammogram for malignant neoplasm of breast: Secondary | ICD-10-CM | POA: Diagnosis not present

## 2019-07-27 MED ORDER — DULOXETINE HCL 60 MG PO CPEP: 60.0000 mg | ORAL_CAPSULE | Freq: Two times a day (BID) | ORAL | 0 refills | Status: DC

## 2019-07-27 NOTE — Telephone Encounter (Signed)
  Prescription Request  07/27/2019  What is the name of the medication or equipment? Cymbalta  Have you contacted your pharmacy to request a refill? (if applicable) yes  Which pharmacy would you like this sent to?  [place], [place] Phone:  [phone]  Fax:  [phone]        Patient notified that their request is being sent to the clinical staff for review and that they should receive a response within 2 business days.

## 2019-07-27 NOTE — Telephone Encounter (Signed)
Stacks. NTBS 30 days given 06/27/19

## 2019-07-27 NOTE — Telephone Encounter (Signed)
Patient informed that 30 day supply was sent in but she will need to be seen for more refills.

## 2019-07-29 MED ORDER — BUDESONIDE-FORMOTEROL FUMARATE 160-4.5 MCG/ACT IN AERO: INHALATION_SPRAY | RESPIRATORY_TRACT | 0 refills | Status: DC

## 2019-07-29 NOTE — Telephone Encounter (Signed)
Appointment scheduled and refill sent.

## 2019-07-29 NOTE — Addendum Note (Signed)
Addended by: Karle Plumber on: 07/29/2019 09:51 AM   Modules accepted: Orders

## 2019-08-02 ENCOUNTER — Encounter (HOSPITAL_COMMUNITY): Payer: Self-pay | Admitting: Psychiatry

## 2019-08-02 ENCOUNTER — Other Ambulatory Visit: Payer: Self-pay

## 2019-08-02 ENCOUNTER — Telehealth (INDEPENDENT_AMBULATORY_CARE_PROVIDER_SITE_OTHER): Payer: Medicare Other | Admitting: Psychiatry

## 2019-08-02 DIAGNOSIS — F411 Generalized anxiety disorder: Secondary | ICD-10-CM | POA: Diagnosis not present

## 2019-08-02 DIAGNOSIS — F332 Major depressive disorder, recurrent severe without psychotic features: Secondary | ICD-10-CM

## 2019-08-02 MED ORDER — DULOXETINE HCL 60 MG PO CPEP: 60.0000 mg | ORAL_CAPSULE | Freq: Two times a day (BID) | ORAL | 2 refills | Status: DC

## 2019-08-02 MED ORDER — CLONAZEPAM 1 MG PO TABS: 1.0000 mg | ORAL_TABLET | Freq: Three times a day (TID) | ORAL | 2 refills | Status: DC | PRN

## 2019-08-02 NOTE — Progress Notes (Signed)
Virtual Visit via Telephone Note  I connected with [name] on 08/02/19 at  1:20 PM EDT by telephone and verified that I am speaking with the correct person using two identifiers.   I discussed the limitations, risks, security and privacy concerns of performing an evaluation and management service by telephone and the availability of in person appointments. I also discussed with the patient that there may be a patient responsible charge related to this service. The patient expressed understanding and agreed to proceed.    I discussed the assessment and treatment plan with the patient. The patient was provided an opportunity to ask questions and all were answered. The patient agreed with the plan and demonstrated an understanding of the instructions.   The patient was advised to call back or seek an in-person evaluation if the symptoms worsen or if the condition fails to improve as anticipated.  I provided 15 minutes of non-face-to-face time during this encounter. Location: Provider office, patient home  [name], MD  [facility] MD/PA/NP OP Progress Note  08/02/2019 1:45 PM [name]  MRN:  [id number]  Chief Complaint:  Chief Complaint    Depression; Anxiety; Follow-up     HPI: This patient is a 64 year old widowed white female who lives alone in [name].  She is on disability.  She returns after 3 months regarding her depression and anxiety.  The patient states she is not been doing well lately.  She is very stressed.  The trailer she is renting is being foreclosed at auction next week.  She does not have much time to find a new place to live.  She does not want to stay with either of her sons because they both drink or use drugs and fight with her girlfriends.  She really does not have anyone else to stay with.  She has tried various agencies to get help.  She had heard of one called [facility] [place] and she is going to check this out.  She is quite stressed but denies severe  depression or suicidal ideation.  She does feel that the medications are helpful and she is sleeping well. Visit Diagnosis:    ICD-10-CM   1. GAD (generalized anxiety disorder)  F41.1 clonazePAM (KLONOPIN) 1 MG tablet  2. Severe episode of recurrent major depressive disorder, without psychotic features ([place])  F33.2 DULoxetine (CYMBALTA) 60 MG capsule    Past Psychiatric History: none  Past Medical History:  Past Medical History:  Diagnosis Date  . Allergy   . Arthritis   . Atypical mole 06/05/2003   slight-mod-Left scapula (WS)  . Atypical nevus 07/26/2003   persist. dysp- Left scapula (WS)  . Basal cell carcinoma 06/05/2003   RIght chest (CX35FU)  . Basal cell carcinoma 05/22/2004   superificial- RIght upper back- (CX35FU)  . Basal cell carcinoma 11/27/2004   beside R nostril-(MOHS), below right outer nose (MOHS)  . Basal cell carcinoma 11/08/2009   left post shoulder -(txpbx)  . Basal cell carcinoma 05/24/2012   ulcerated- Left post shoulder- (CX35FU), ulcerated-mid forehead (EXC )  . Basal cell carcinoma 06/24/2012   superificial- Right shoulder - (txpbx)   . Basal cell carcinoma 07/29/2012    sclerosis- mid forehead (MOHS)  . Basal cell carcinoma 06/06/2019   nod-right anterior neck (CX35FU)  . Constipation    uses OTC meds with help  . COPD (chronic obstructive pulmonary disease) ([place])   . Coronary artery disease    minimal nonobstructive   . Depression   .  GERD (gastroesophageal reflux disease)   . Hx of adenomatous polyp of colon 10/03/2014  . Hypercholesteremia   . Hypertension   . Panic attacks   . Perimenopausal   . Skin cancer   . Squamous cell carcinoma of skin 06/05/2003   Right [place](CX35FU)  . Squamous cell carcinoma of skin 05/22/2004   in situ- Left upperarm (CX35FU)  . Squamous cell carcinoma of skin 11/27/2004   in situ- above Left elbow (CX35FU)  . Squamous cell carcinoma of skin 03/04/2006   in situ- Left sideburn (CX35FU)  . Squamous cell  carcinoma of skin 11/26/2006   in situ- mid brow (Cx35FU)  . Squamous cell carcinoma of skin 05/24/2012   in situ- Left chest- (CX35FU)  . Squamous cell carcinoma of skin 04/05/2013   in situ- Left nose (Cx35FU)  . Squamous cell carcinoma of skin 08/17/2013   well diff-above Left eye (txpbx)  . Squamous cell carcinoma of skin 07/30/2016   in situ- right chest sup (Cx35FU)  . Squamous cell carcinoma of skin 05/12/2017   in situ- RIght shoulder (CX35FU), in situ- Left forehead (CX35FU)  . Squamous cell carcinoma of skin 11/23/2018   in situ- Right mid shin, inner- (MOHS)  . Tobacco user     Past Surgical History:  Procedure Laterality Date  . ABDOMINAL HYSTERECTOMY    . COLONOSCOPY    . Right foot toe surgery    . UPPER GASTROINTESTINAL ENDOSCOPY    . vocal cord     polyp removal    Family Psychiatric History: see below  Family History:  Family History  Problem Relation Age of Onset  . Heart disease Mother   . Heart attack Mother   . Heart attack Father   . Heart attack Brother   . Colon cancer Brother 27       died at age 102  . Anxiety disorder Brother   . Depression Brother   . Alcohol abuse Brother   . Colon polyps Brother   . Colon polyps Brother   . Colon polyps Sister   . Esophageal cancer Sister   . Anxiety disorder Sister   . Depression Sister   . Colon polyps Sister   . Drug abuse Son   . Rectal cancer Neg Hx   . Stomach cancer Neg Hx     Social History:  Social History   Socioeconomic History  . Marital status: Single    Spouse name: Not on file  . Number of children: Not on file  . Years of education: Not on file  . Highest education level: Not on file  Occupational History  . 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 Smoking status: Current Every Day Smoker    Packs/day: 0.50    Years: 40.00    Pack years: 20.00    Types: Cigarettes    Start date: 05/01/1969  . Smokeless tobacco: Never Used  . Tobacco  comment: would like information. has tried chantix and wellbutrin  Substance and Sexual Activity  . Alcohol use: No    Alcohol/week: 0.0 standard drinks    Comment: Per pt no 04-02-15.  . Drug use: No    Comment: per pt, Cocaine was in her system October and November 2016 when she went to the pain clinic 04-02-15.  Mar[place] Kitchen Sexual activity: Never  Other Topics Concern  . Not on file  Social History Narrative   Lives in [place] with husband   Under  a lot of family stress   [facility] to [facility].         Social Determinants of Health   Financial Resource Strain:   . Difficulty of Paying Living Expenses:   Food Insecurity:   . Worried About Charity fundraiser in the Last Year:   . Arboriculturist in the Last Year:   Transportation Needs:   . Film/video editor (Medical):   Marland Kitchen Lack of Transportation (Non-Medical):   Physical Activity:   . Days of Exercise per Week:   . Minutes of Exercise per Session:   Stress:   . Feeling of Stress :   Social Connections:   . Frequency of Communication with Friends and Family:   . Frequency of Social Gatherings with Friends and Family:   . Attends Religious Services:   . Active Member of Clubs or Organizations:   . Attends Archivist Meetings:   Marland Kitchen Marital Status:     Allergies:  Allergies  Allergen Reactions  . Lorcet 10-650 [Hydrocodone-Acetaminophen] Itching  . Sulfa Antibiotics Other (See Comments)    aching  . Tape Other (See Comments)    Please use paper tape    Metabolic Disorder Labs: Lab Results  Component Value Date   HGBA1C 5.9 (H) 07/12/2013   MPG 123 (H) 07/12/2013   No results found for: PROLACTIN Lab Results  Component Value Date   CHOL 165 05/25/2017   TRIG 90 05/25/2017   HDL 51 05/25/2017   CHOLHDL 3.2 05/25/2017   VLDL 36 07/13/2013   LDLCALC 96 05/25/2017   LDLCALC 65 09/01/2016   Lab Results  Component Value Date   TSH 1.820 05/25/2017   TSH 1.080 05/17/2015     Therapeutic Level Labs: No results found for: LITHIUM No results found for: VALPROATE No components found for:  CBMZ  Current Medications: Current Outpatient Medications  Medication Sig Dispense Refill  . albuterol (VENTOLIN HFA) 108 (90 Base) MCG/ACT inhaler Inhale 2 puffs into the lungs every 4 (four) hours as needed for wheezing or shortness of breath. 3 Inhaler 3  . amLODipine (NORVASC) 5 MG tablet TAKE 1 TABLET BY MOUTH  DAILY 90 tablet 3  . Ascorbic Acid (VITAMIN C) 1000 MG tablet Take 1,000 mg by mouth daily.    Marland Kitchen aspirin EC 81 MG tablet Take 81 mg by mouth daily.    [place] atorvastatin (LIPITOR) 40 MG tablet TAKE 1 TABLET BY MOUTH AT  BEDTIME 90 tablet 3  . benzonatate (TESSALON) 100 MG capsule Take 1 capsule (100 mg total) by mouth every 8 (eight) hours. 21 capsule 0  . budesonide-formoterol (SYMBICORT) 160-4.5 MCG/ACT inhaler INHALE TWO PUFFS INTO THE LUNGS TWICE DAILY (Needs to be seen before next refill) 6 g 0  . clonazePAM (KLONOPIN) 1 MG tablet Take 1 tablet (1 mg total) by mouth 3 (three) times daily as needed. for anxiety 90 tablet 2  . cloNIDine (CATAPRES) 0.1 MG tablet Take 1 tablet (0.1 mg total) by mouth at bedtime. (Needs to be seen before next refill) 30 tablet 0  . DULoxetine (CYMBALTA) 60 MG capsule Take 1 capsule (60 mg total) by mouth 2 (two) times daily. 60 capsule 2  . fish oil-omega-3 fatty acids 1000 MG capsule Take 1 g by mouth daily.     Marland Kitchen gabapentin (NEURONTIN) 300 MG capsule Take 300-600 mg by mouth See admin instructions. Take 1 capsule in the morning and 2 capsule at bedtime    .  HYDROcodone-homatropine (HYCODAN) 5-1.5 MG/5ML syrup Take 5 mLs by mouth every 8 (eight) hours as needed for cough. 120 mL 0  . ipratropium-albuterol (DUONEB) 0.5-2.5 (3) MG/3ML SOLN Take 3 mLs by nebulization every 6 (six) hours as needed. 360 mL 5  . levofloxacin (LEVAQUIN) 500 MG tablet Take 1 tablet (500 mg total) by mouth daily. For 10 days 10 tablet 0  . lisinopril (ZESTRIL)  20 MG tablet TAKE 1 TABLET BY MOUTH  DAILY 90 tablet 3  . mupirocin ointment (BACTROBAN) 2 % Place 1 application into the nose 2 (two) times daily. 22 g 1  . Oxycodone HCl 10 MG TABS Take 10 mg by mouth every 4 (four) hours as needed.    . pantoprazole (PROTONIX) 40 MG tablet TAKE 1 TABLET BY MOUTH  TWICE DAILY 180 tablet 3  . SPIRIVA RESPIMAT 2.5 MCG/ACT AERS INHALE TWO PUFFS INTO THE LUNGS DAILY 4 g 2   No current facility-administered medications for this visit.     Musculoskeletal: Strength & Muscle Tone: within normal limits Gait & Station: normal Patient leans: N/A  Psychiatric Specialty Exam: Review of Systems  Psychiatric/Behavioral: Positive for dysphoric mood.  All other systems reviewed and are negative.   There were no vitals taken for this visit.There is no height or weight on file to calculate BMI.  General Appearance: NA  Eye Contact:  NA  Speech:  Clear and Coherent  Volume:  Normal  Mood:  Dysphoric  Affect:  NA  Thought Process:  Goal Directed  Orientation:  Full (Time, Place, and Person)  Thought Content: Rumination   Suicidal Thoughts:  No  Homicidal Thoughts:  No  Memory:  Immediate;   Good Recent;   Good Remote;   Fair  Judgement:  Good  Insight:  Fair  Psychomotor Activity:  Decreased  Concentration:  Concentration: Good and Attention Span: Good  Recall:  [place] of Knowledge: Fair  Language: Good  Akathisia:  No  Handed:  Right  AIMS (if indicated): not done  Assets:  Communication Skills Desire for Improvement Resilience Social Support Talents/Skills  ADL's:  Intact  Cognition: WNL  Sleep:  Good   Screenings: GAD-7     Counselor from 11/20/2015 in [place] Office Visit from 02/09/2015 in [place]  Total GAD-7 Score  9  21    PHQ2-9     Office Visit from 07/15/2018 in [place] Visit from 07/08/2018 in [place] Office Visit from 06/21/2018 in [place] Office Visit from 04/28/2018 in [place] Visit from 04/13/2018 in [place]  PHQ-2 Total Score  0  0  3  0  0  PHQ-9 Total Score  --  --  7  --  --       Assessment and Plan: This patient is a 64 year old female with a history of depression and anxiety.  She is under a lot of stress right now but still seems to be holding her own.  She will continue Cymbalta 60 mg twice daily for depression and clonazepam 1 mg 3 times daily for anxiety.  She will return to see me in 3 months   [name], MD 08/02/2019, 1:45 PM

## 2019-08-08 DIAGNOSIS — H35033 Hypertensive retinopathy, bilateral: Secondary | ICD-10-CM | POA: Diagnosis not present

## 2019-08-11 DIAGNOSIS — M6281 Muscle weakness (generalized): Secondary | ICD-10-CM | POA: Diagnosis not present

## 2019-08-15 DIAGNOSIS — H5213 Myopia, bilateral: Secondary | ICD-10-CM | POA: Diagnosis not present

## 2019-08-22 ENCOUNTER — Other Ambulatory Visit: Payer: Self-pay

## 2019-08-22 ENCOUNTER — Encounter: Payer: Self-pay | Admitting: Family Medicine

## 2019-08-22 ENCOUNTER — Ambulatory Visit (INDEPENDENT_AMBULATORY_CARE_PROVIDER_SITE_OTHER): Payer: Medicare Other | Admitting: Family Medicine

## 2019-08-22 VITALS — BP 121/71 | HR 81 | Temp 96.4°F | Resp 20 | Ht 65.0 in | Wt 151.4 lb

## 2019-08-22 DIAGNOSIS — I10 Essential (primary) hypertension: Secondary | ICD-10-CM | POA: Diagnosis not present

## 2019-08-22 DIAGNOSIS — M1990 Unspecified osteoarthritis, unspecified site: Secondary | ICD-10-CM | POA: Diagnosis not present

## 2019-08-22 DIAGNOSIS — J449 Chronic obstructive pulmonary disease, unspecified: Secondary | ICD-10-CM | POA: Diagnosis not present

## 2019-08-22 DIAGNOSIS — F5101 Primary insomnia: Secondary | ICD-10-CM | POA: Diagnosis not present

## 2019-08-22 DIAGNOSIS — F1310 Sedative, hypnotic or anxiolytic abuse, uncomplicated: Secondary | ICD-10-CM

## 2019-08-22 DIAGNOSIS — E559 Vitamin D deficiency, unspecified: Secondary | ICD-10-CM | POA: Diagnosis not present

## 2019-08-22 DIAGNOSIS — Z23 Encounter for immunization: Secondary | ICD-10-CM | POA: Diagnosis not present

## 2019-08-22 DIAGNOSIS — E782 Mixed hyperlipidemia: Secondary | ICD-10-CM | POA: Diagnosis not present

## 2019-08-22 DIAGNOSIS — K2100 Gastro-esophageal reflux disease with esophagitis, without bleeding: Secondary | ICD-10-CM

## 2019-08-22 DIAGNOSIS — F17200 Nicotine dependence, unspecified, uncomplicated: Secondary | ICD-10-CM

## 2019-08-22 DIAGNOSIS — M5126 Other intervertebral disc displacement, lumbar region: Secondary | ICD-10-CM

## 2019-08-22 MED ORDER — AMLODIPINE BESYLATE 5 MG PO TABS: 5.0000 mg | ORAL_TABLET | Freq: Every day | ORAL | 3 refills | Status: DC

## 2019-08-22 MED ORDER — ATORVASTATIN CALCIUM 40 MG PO TABS: 40.0000 mg | ORAL_TABLET | Freq: Every day | ORAL | 3 refills | Status: DC

## 2019-08-22 MED ORDER — CLONIDINE HCL 0.1 MG PO TABS: 0.1000 mg | ORAL_TABLET | Freq: Every day | ORAL | 1 refills | Status: DC

## 2019-08-22 MED ORDER — DEXILANT 60 MG PO CPDR
60.0000 mg | DELAYED_RELEASE_CAPSULE | Freq: Every day | ORAL | 1 refills | Status: DC
Start: 2019-08-22 — End: 2020-01-24

## 2019-08-22 MED ORDER — LISINOPRIL 20 MG PO TABS: 20.0000 mg | ORAL_TABLET | Freq: Every day | ORAL | 3 refills | Status: DC

## 2019-08-22 NOTE — Progress Notes (Signed)
Subjective:  Patient ID: [name], female    DOB: [birth year]  Age: 64 y.o. MRN: [id number]  CC: No chief complaint on file.   HPI [name] presents for  follow-up of hypertension. Patient has no history of headache chest pain or shortness of breath or recent cough. Patient also denies symptoms of TIA such as focal numbness or weakness. Patient denies side effects from medication. States taking it regularly.  Patient said she is not breathing very well.  She gets short of breath with walking about 5200 feet.  She says she has backed off smoking to less than a pack a day.  She cannot remember the name of the lung doctor she saw she would like to have a new referral if we can figure out who it was.  Patient continues to have problems with reflux.  She says if she just bends over better tasting fluid comes up into her mouth.  She is not getting adequate relief from the pantoprazole taken twice daily.  Patient continues to go to pain management and work on her lumbar radiculopathy there with oxycodone and gabapentin.  Psychiatry prescribes her benzodiazepines as well as antidepressants.   History [name] has a past medical history of Allergy, Arthritis, Atypical mole (06/05/2003), Atypical nevus (07/26/2003), Basal cell carcinoma (06/05/2003), Basal cell carcinoma (05/22/2004), Basal cell carcinoma (11/27/2004), Basal cell carcinoma (11/08/2009), Basal cell carcinoma (05/24/2012), Basal cell carcinoma (06/24/2012), Basal cell carcinoma (07/29/2012), Basal cell carcinoma (06/06/2019), Constipation, COPD (chronic obstructive pulmonary disease) ([place]), Coronary artery disease, Depression, GERD (gastroesophageal reflux disease), adenomatous polyp of colon (10/03/2014), Hypercholesteremia, Hypertension, Panic attacks, Perimenopausal, Skin cancer, Squamous cell carcinoma of skin (06/05/2003), Squamous cell carcinoma of skin (05/22/2004), Squamous cell carcinoma of skin (11/27/2004), Squamous cell  carcinoma of skin (03/04/2006), Squamous cell carcinoma of skin (11/26/2006), Squamous cell carcinoma of skin (05/24/2012), Squamous cell carcinoma of skin (04/05/2013), Squamous cell carcinoma of skin (08/17/2013), Squamous cell carcinoma of skin (07/30/2016), Squamous cell carcinoma of skin (05/12/2017), Squamous cell carcinoma of skin (11/23/2018), and Tobacco user.   She has a past surgical history that includes Abdominal hysterectomy; vocal cord; Right foot toe surgery; Colonoscopy; and Upper gastrointestinal endoscopy.   Her family history includes Alcohol abuse in her brother; Anxiety disorder in her brother and sister; Colon cancer (age of onset: 54) in her brother; Colon polyps in her brother, brother, sister, and sister; Depression in her brother and sister; Drug abuse in her son; Esophageal cancer in her sister; Heart attack in her brother, father, and mother; Heart disease in her mother.She reports that she has been smoking cigarettes. She started smoking about 50 years ago. She has a 20.00 pack-year smoking history. She has never used smokeless tobacco. She reports that she does not drink alcohol and does not use drugs.  Current Outpatient Medications on File Prior to Visit  Medication Sig Dispense Refill  . albuterol (VENTOLIN HFA) 108 (90 Base) MCG/ACT inhaler Inhale 2 puffs into the lungs every 4 (four) hours as needed for wheezing or shortness of breath. 3 Inhaler 3  . Ascorbic Acid (VITAMIN C) 1000 MG tablet Take 1,000 mg by mouth daily.    Marland Kitchen aspirin EC 81 MG tablet Take 81 mg by mouth daily.    . budesonide-formoterol (SYMBICORT) 160-4.5 MCG/ACT inhaler INHALE TWO PUFFS INTO THE LUNGS TWICE DAILY (Needs to be seen before next refill) 6 g 0  . clonazePAM (KLONOPIN) 1 MG tablet Take 1 tablet (1 mg total) by mouth 3 (three) times daily  as needed. for anxiety 90 tablet 2  . DULoxetine (CYMBALTA) 60 MG capsule Take 1 capsule (60 mg total) by mouth 2 (two) times daily. 60 capsule 2  . fish  oil-omega-3 fatty acids 1000 MG capsule Take 1 g by mouth daily.     Marland Kitchen gabapentin (NEURONTIN) 300 MG capsule Take 300-600 mg by mouth See admin instructions. Take 1 capsule in the morning and 2 capsule at bedtime    . ipratropium-albuterol (DUONEB) 0.5-2.5 (3) MG/3ML SOLN Take 3 mLs by nebulization every 6 (six) hours as needed. 360 mL 5  . mupirocin ointment (BACTROBAN) 2 % Place 1 application into the nose 2 (two) times daily. 22 g 1  . Oxycodone HCl 10 MG TABS Take 10 mg by mouth every 4 (four) hours as needed.    Marland Kitchen SPIRIVA RESPIMAT 2.5 MCG/ACT AERS INHALE TWO PUFFS INTO THE LUNGS DAILY 4 g 2   No current facility-administered medications on file prior to visit.    ROS Review of Systems  Constitutional: Negative.   HENT: Negative.   Eyes: Negative for visual disturbance.  Respiratory: Positive for shortness of breath.   Cardiovascular: Negative for chest pain.  Gastrointestinal: Negative for abdominal pain.  Musculoskeletal: Positive for arthralgias, back pain and myalgias (Cramps in her legs.  This is mostly at night.  It is mostly in the toes feet and hands as well.).    Objective:  BP 121/71   Pulse 81   Temp (!) 96.4 F (35.8 C) (Temporal)   Resp 20   Ht '5\' 5"'$  (1.651 m)   Wt 151 lb 6 oz (68.7 kg)   SpO2 93%   BMI 25.19 kg/m   BP Readings from Last 3 Encounters:  08/22/19 121/71  06/15/19 130/88  03/06/19 132/78    Wt Readings from Last 3 Encounters:  08/22/19 151 lb 6 oz (68.7 kg)  06/15/19 150 lb (68 kg)  03/05/19 147 lb (66.7 kg)     Physical Exam Constitutional:      General: She is not in acute distress.    Appearance: She is well-developed. She is ill-appearing.  HENT:     Head: Normocephalic.  Cardiovascular:     Rate and Rhythm: Normal rate and regular rhythm.  Pulmonary:     Breath sounds: Normal breath sounds.  Skin:    General: Skin is warm and dry.  Neurological:     Mental Status: She is alert and oriented to person, place, and time.        Assessment & Plan:   Diagnoses and all orders for this visit:  Essential hypertension -     CBC with Differential/Platelet -     CMP14+EGFR -     amLODipine (NORVASC) 5 MG tablet; Take 1 tablet (5 mg total) by mouth daily. -     lisinopril (ZESTRIL) 20 MG tablet; Take 1 tablet (20 mg total) by mouth daily.  Osteoarthritis, unspecified osteoarthritis type, unspecified site -     CBC with Differential/Platelet -     CMP14+EGFR  Primary insomnia -     CBC with Differential/Platelet -     CMP14+EGFR  Mixed hyperlipidemia -     CBC with Differential/Platelet -     CMP14+EGFR -     Lipid panel -     atorvastatin (LIPITOR) 40 MG tablet; Take 1 tablet (40 mg total) by mouth at bedtime.  Chronic obstructive pulmonary disease, unspecified COPD type ([place]) -     CBC with Differential/Platelet -  CMP14+EGFR -     Ambulatory referral to Pulmonology  TOBACCO ABUSE -     CBC with Differential/Platelet -     CMP14+EGFR -     Ambulatory referral to Pulmonology  Vitamin D deficiency -     CBC with Differential/Platelet -     CMP14+EGFR -     VITAMIN D 25 Hydroxy (Vit-D Deficiency, Fractures)  Benzodiazepine abuse, continuous (HCC) -     CBC with Differential/Platelet -     CMP14+EGFR  Lumbar herniated disc -     CBC with Differential/Platelet -     CMP14+EGFR  Gastroesophageal reflux disease with esophagitis without hemorrhage -     Ambulatory referral to Gastroenterology  Other orders -     Tdap vaccine greater than or equal to 7yo IM -     Pneumococcal conjugate vaccine 13-valent -     cloNIDine (CATAPRES) 0.1 MG tablet; Take 1 tablet (0.1 mg total) by mouth at bedtime. -     dexlansoprazole (DEXILANT) 60 MG capsule; Take 1 capsule (60 mg total) by mouth daily. On an empty stomach, then do not eat for 1 hour (replaces pantoprazole for reflux)   Allergies as of 08/22/2019      Reactions   Lorcet 10-650 [hydrocodone-acetaminophen] Itching   Sulfa Antibiotics  Other (See Comments)   aching   Tape Other (See Comments)   Please use paper tape      Medication List       Accurate as of August 22, 2019  5:07 PM. If you have any questions, ask your nurse or doctor.        STOP taking these medications   benzonatate 100 MG capsule Commonly known as: TESSALON Stopped by: Claretta Fraise, MD   HYDROcodone-homatropine 5-1.5 MG/5ML syrup Commonly known as: Hycodan Stopped by: Claretta Fraise, MD   levofloxacin 500 MG tablet Commonly known as: LEVAQUIN Stopped by: Claretta Fraise, MD   pantoprazole 40 MG tablet Commonly known as: PROTONIX Stopped by: Claretta Fraise, MD     TAKE these medications   albuterol 108 (90 Base) MCG/ACT inhaler Commonly known as: VENTOLIN HFA Inhale 2 puffs into the lungs every 4 (four) hours as needed for wheezing or shortness of breath.   amLODipine 5 MG tablet Commonly known as: NORVASC Take 1 tablet (5 mg total) by mouth daily.   aspirin EC 81 MG tablet Take 81 mg by mouth daily.   atorvastatin 40 MG tablet Commonly known as: LIPITOR Take 1 tablet (40 mg total) by mouth at bedtime.   budesonide-formoterol 160-4.5 MCG/ACT inhaler Commonly known as: Symbicort INHALE TWO PUFFS INTO THE LUNGS TWICE DAILY (Needs to be seen before next refill)   clonazePAM 1 MG tablet Commonly known as: KLONOPIN Take 1 tablet (1 mg total) by mouth 3 (three) times daily as needed. for anxiety   cloNIDine 0.1 MG tablet Commonly known as: CATAPRES Take 1 tablet (0.1 mg total) by mouth at bedtime. What changed: additional instructions Changed by: Claretta Fraise, MD   Dexilant 60 MG capsule Generic drug: dexlansoprazole Take 1 capsule (60 mg total) by mouth daily. On an empty stomach, then do not eat for 1 hour (replaces pantoprazole for reflux) Started by: Claretta Fraise, MD   DULoxetine 60 MG capsule Commonly known as: CYMBALTA Take 1 capsule (60 mg total) by mouth 2 (two) times daily.   fish oil-omega-3 fatty acids 1000  MG capsule Take 1 g by mouth daily.   gabapentin 300 MG capsule Commonly known as: NEURONTIN  Take 300-600 mg by mouth See admin instructions. Take 1 capsule in the morning and 2 capsule at bedtime   ipratropium-albuterol 0.5-2.5 (3) MG/3ML Soln Commonly known as: DUONEB Take 3 mLs by nebulization every 6 (six) hours as needed.   lisinopril 20 MG tablet Commonly known as: ZESTRIL Take 1 tablet (20 mg total) by mouth daily.   mupirocin ointment 2 % Commonly known as: BACTROBAN Place 1 application into the nose 2 (two) times daily.   Oxycodone HCl 10 MG Tabs Take 10 mg by mouth every 4 (four) hours as needed.   Spiriva Respimat 2.5 MCG/ACT Aers Generic drug: Tiotropium Bromide Monohydrate INHALE TWO PUFFS INTO THE LUNGS DAILY   vitamin C 1000 MG tablet Take 1,000 mg by mouth daily.       Meds ordered this encounter  Medications  . amLODipine (NORVASC) 5 MG tablet    Sig: Take 1 tablet (5 mg total) by mouth daily.    Dispense:  90 tablet    Refill:  3    Requesting 1 year supply  . atorvastatin (LIPITOR) 40 MG tablet    Sig: Take 1 tablet (40 mg total) by mouth at bedtime.    Dispense:  90 tablet    Refill:  3    Requesting 1 year supply  . cloNIDine (CATAPRES) 0.1 MG tablet    Sig: Take 1 tablet (0.1 mg total) by mouth at bedtime.    Dispense:  90 tablet    Refill:  1  . lisinopril (ZESTRIL) 20 MG tablet    Sig: Take 1 tablet (20 mg total) by mouth daily.    Dispense:  90 tablet    Refill:  3    Requesting 1 year supply  . dexlansoprazole (DEXILANT) 60 MG capsule    Sig: Take 1 capsule (60 mg total) by mouth daily. On an empty stomach, then do not eat for 1 hour (replaces pantoprazole for reflux)    Dispense:  90 capsule    Refill:  1    She was counseled regarding smoking and its relation to her various conditions but mostly to her COPD and shortness of breath.  She will try the patches.  Follow-up: Return in about 6 months (around 02/21/2020), or if  symptoms worsen or fail to improve.  Claretta Fraise, M.D.

## 2019-08-23 ENCOUNTER — Encounter: Payer: Self-pay | Admitting: Family Medicine

## 2019-08-23 LAB — LIPID PANEL
Chol/HDL Ratio: 4.2 ratio (ref 0.0–4.4)
Cholesterol, Total: 183 mg/dL (ref 100–199)
HDL: 44 mg/dL (ref >39)
LDL Chol Calc (NIH): 105 mg/dL — ABNORMAL HIGH (ref 0–99)
Triglycerides: 199 mg/dL — ABNORMAL HIGH (ref 0–149)
VLDL Cholesterol Cal: 34 mg/dL (ref 5–40)

## 2019-08-23 LAB — CBC WITH DIFFERENTIAL/PLATELET
Basophils Absolute: 0 10*3/uL (ref 0.0–0.2)
Basos: 0 %
EOS (ABSOLUTE): 0.1 10*3/uL (ref 0.0–0.4)
Eos: 1 %
Hematocrit: 42.4 % (ref 34.0–46.6)
Hemoglobin: 14.5 g/dL (ref 11.1–15.9)
Immature Grans (Abs): 0 10*3/uL (ref 0.0–0.1)
Immature Granulocytes: 0 %
Lymphocytes Absolute: 2.2 10*3/uL (ref 0.7–3.1)
Lymphs: 25 %
MCH: 31.9 pg (ref 26.6–33.0)
MCHC: 34.2 g/dL (ref 31.5–35.7)
MCV: 93 fL (ref 79–97)
Monocytes Absolute: 0.6 10*3/uL (ref 0.1–0.9)
Monocytes: 7 %
Neutrophils Absolute: 5.9 10*3/uL (ref 1.4–7.0)
Neutrophils: 67 %
Platelets: 342 10*3/uL (ref 150–450)
RBC: 4.54 x10E6/uL (ref 3.77–5.28)
RDW: 13.4 % (ref 11.7–15.4)
WBC: 8.9 10*3/uL (ref 3.4–10.8)

## 2019-08-23 LAB — CMP14+EGFR
ALT: 17 IU/L (ref 0–32)
AST: 18 IU/L (ref 0–40)
Albumin/Globulin Ratio: 1.9 (ref 1.2–2.2)
Albumin: 4.3 g/dL (ref 3.8–4.8)
Alkaline Phosphatase: 95 IU/L (ref 48–121)
BUN/Creatinine Ratio: 19 (ref 12–28)
BUN: 15 mg/dL (ref 8–27)
Bilirubin Total: 0.2 mg/dL (ref 0.0–1.2)
CO2: 26 mmol/L (ref 20–29)
Calcium: 9.5 mg/dL (ref 8.7–10.3)
Chloride: 103 mmol/L (ref 96–106)
Creatinine, Ser: 0.79 mg/dL (ref 0.57–1.00)
GFR calc Af Amer: 90 mL/min/{1.73_m2} (ref >59)
GFR calc non Af Amer: 78 mL/min/{1.73_m2} (ref >59)
Globulin, Total: 2.3 g/dL (ref 1.5–4.5)
Glucose: 121 mg/dL — ABNORMAL HIGH (ref 65–99)
Potassium: 4.5 mmol/L (ref 3.5–5.2)
Sodium: 141 mmol/L (ref 134–144)
Total Protein: 6.6 g/dL (ref 6.0–8.5)

## 2019-08-23 LAB — VITAMIN D 25 HYDROXY (VIT D DEFICIENCY, FRACTURES): Vit D, 25-Hydroxy: 29.2 ng/mL — ABNORMAL LOW (ref 30.0–100.0)

## 2019-08-24 ENCOUNTER — Other Ambulatory Visit: Payer: Self-pay | Admitting: Family Medicine

## 2019-09-11 DIAGNOSIS — M6281 Muscle weakness (generalized): Secondary | ICD-10-CM | POA: Diagnosis not present

## 2019-09-29 DIAGNOSIS — M5442 Lumbago with sciatica, left side: Secondary | ICD-10-CM | POA: Diagnosis not present

## 2019-09-29 DIAGNOSIS — Z79899 Other long term (current) drug therapy: Secondary | ICD-10-CM | POA: Diagnosis not present

## 2019-09-29 DIAGNOSIS — G43909 Migraine, unspecified, not intractable, without status migrainosus: Secondary | ICD-10-CM | POA: Diagnosis not present

## 2019-09-29 DIAGNOSIS — M542 Cervicalgia: Secondary | ICD-10-CM | POA: Diagnosis not present

## 2019-10-12 DIAGNOSIS — M6281 Muscle weakness (generalized): Secondary | ICD-10-CM | POA: Diagnosis not present

## 2019-10-20 ENCOUNTER — Ambulatory Visit: Payer: Medicare Other | Admitting: Internal Medicine

## 2019-10-27 ENCOUNTER — Other Ambulatory Visit (HOSPITAL_COMMUNITY): Payer: Self-pay | Admitting: Psychiatry

## 2019-10-27 ENCOUNTER — Other Ambulatory Visit: Payer: Self-pay | Admitting: Family Medicine

## 2019-10-27 DIAGNOSIS — F332 Major depressive disorder, recurrent severe without psychotic features: Secondary | ICD-10-CM

## 2019-10-27 DIAGNOSIS — F411 Generalized anxiety disorder: Secondary | ICD-10-CM

## 2019-10-29 ENCOUNTER — Other Ambulatory Visit: Payer: Self-pay | Admitting: Family Medicine

## 2019-10-29 DIAGNOSIS — J439 Emphysema, unspecified: Secondary | ICD-10-CM

## 2019-11-02 ENCOUNTER — Other Ambulatory Visit: Payer: Self-pay

## 2019-11-02 ENCOUNTER — Institutional Professional Consult (permissible substitution): Payer: Medicare Other | Admitting: Pulmonary Disease

## 2019-11-02 ENCOUNTER — Encounter (HOSPITAL_COMMUNITY): Payer: Self-pay | Admitting: Psychiatry

## 2019-11-02 ENCOUNTER — Telehealth (INDEPENDENT_AMBULATORY_CARE_PROVIDER_SITE_OTHER): Payer: Medicare Other | Admitting: Psychiatry

## 2019-11-02 DIAGNOSIS — F411 Generalized anxiety disorder: Secondary | ICD-10-CM

## 2019-11-02 DIAGNOSIS — F332 Major depressive disorder, recurrent severe without psychotic features: Secondary | ICD-10-CM | POA: Diagnosis not present

## 2019-11-02 MED ORDER — CLONAZEPAM 1 MG PO TABS: 1.0000 mg | ORAL_TABLET | Freq: Three times a day (TID) | ORAL | 2 refills | Status: DC | PRN

## 2019-11-02 MED ORDER — DULOXETINE HCL 60 MG PO CPEP: 60.0000 mg | ORAL_CAPSULE | Freq: Two times a day (BID) | ORAL | 2 refills | Status: DC
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
## 2019-11-03 ENCOUNTER — Encounter: Payer: Self-pay | Admitting: Pulmonary Disease

## 2019-11-03 ENCOUNTER — Ambulatory Visit (INDEPENDENT_AMBULATORY_CARE_PROVIDER_SITE_OTHER): Payer: Medicare Other

## 2019-11-03 ENCOUNTER — Ambulatory Visit (INDEPENDENT_AMBULATORY_CARE_PROVIDER_SITE_OTHER): Payer: Medicare Other | Admitting: Pulmonary Disease

## 2019-11-03 ENCOUNTER — Other Ambulatory Visit: Payer: Self-pay

## 2019-11-03 VITALS — BP 128/98 | HR 97 | Temp 98.1°F | Ht 65.0 in | Wt 153.2 lb

## 2019-11-03 DIAGNOSIS — J449 Chronic obstructive pulmonary disease, unspecified: Secondary | ICD-10-CM

## 2019-11-03 NOTE — Addendum Note (Signed)
Addended by: Vanessa Barbara on: 11/03/2019 01:51 PM   Modules accepted: Orders

## 2019-11-03 NOTE — Progress Notes (Signed)
[name]    [id number]    04-Aug-1955  Primary Care Physician:Stacks, Cletus Gash, MD  Referring Physician: Claretta Fraise, MD [place],  [place] [address]  Chief complaint: Follow up for COPD  HPI: 64 year old with history of coronary artery disease, COPD, active smoker Diagnosed with COPD about 6 months ago.  Has chief complaints of daily cough with white-yellow mucus.  Dyspnea with wheezing.  Maintained on Symbicort inhaler.  Pets: Has 2 dogs, no birds, farm animals Occupation: Used to work in a Research officer, trade union.  Currently on disability Exposures: Reports living in a home with significant mold exposure for the past 10 months. Smoking history: 50 pack year smoker.  Continues to smoke 1 pack/day Travel history: No significant travel history Relevant family history: Sister has COPD.  Interim history: Evaluated in the ED in January for pleuritic right chest pain with negative CTA, negative cardiac work-up She continues to have pain which is unchanged Continues to smoke 1 pack/day Has chronic cough with white mucus  States that she no longer has visible mold in her house, hypersensitivity panel was negative  Outpatient Encounter Medications as of 11/03/2019  Medication Sig  . albuterol (VENTOLIN HFA) 108 (90 Base) MCG/ACT inhaler Inhale 2 puffs into the lungs every 4 (four) hours as needed for wheezing or shortness of breath.  Marland Kitchen amLODipine (NORVASC) 5 MG tablet Take 1 tablet (5 mg total) by mouth daily.  . Ascorbic Acid (VITAMIN C) 1000 MG tablet Take 1,000 mg by mouth daily.  Marland Kitchen aspirin EC 81 MG tablet Take 81 mg by mouth daily.  Marland Kitchen atorvastatin (LIPITOR) 40 MG tablet Take 1 tablet (40 mg total) by mouth at bedtime.  . budesonide-formoterol (SYMBICORT) 160-4.5 MCG/ACT inhaler INHALE TWO PUFFS INTO THE LUNGS BY MOUTH TWICE DAILY  . clonazePAM (KLONOPIN) 1 MG tablet Take 1 tablet (1 mg total) by mouth 3 (three) times daily as needed. for anxiety  . cloNIDine  (CATAPRES) 0.1 MG tablet Take 1 tablet (0.1 mg total) by mouth at bedtime.  Marland Kitchen dexlansoprazole (DEXILANT) 60 MG capsule Take 1 capsule (60 mg total) by mouth daily. On an empty stomach, then do not eat for 1 hour (replaces pantoprazole for reflux)  . DULoxetine (CYMBALTA) 60 MG capsule Take 1 capsule (60 mg total) by mouth 2 (two) times daily.  . fish oil-omega-3 fatty acids 1000 MG capsule Take 1 g by mouth daily.   Marland Kitchen gabapentin (NEURONTIN) 300 MG capsule Take 300-600 mg by mouth See admin instructions. Take 1 capsule in the morning and 2 capsule at bedtime  . ipratropium-albuterol (DUONEB) 0.5-2.5 (3) MG/3ML SOLN Take 3 mLs by nebulization every 6 (six) hours as needed.  Marland Kitchen lisinopril (ZESTRIL) 20 MG tablet Take 1 tablet (20 mg total) by mouth daily.  . Oxycodone HCl 10 MG TABS Take 10 mg by mouth every 4 (four) hours as needed.  Marland Kitchen SPIRIVA RESPIMAT 2.5 MCG/ACT AERS INHALE TWO PUFFS INTO THE LUNGS DAILY  . [DISCONTINUED] mupirocin ointment (BACTROBAN) 2 % Place 1 application into the nose 2 (two) times daily.   No facility-administered encounter medications on file as of 11/03/2019.    Physical Exam: Blood pressure (!) 128/98, pulse 97, temperature 98.1 F (36.7 C), temperature source Oral, height 5\' 5"  (1.651 m), weight 153 lb 3.2 oz (69.5 kg), SpO2 (!) 65 %. Gen:      No acute distress HEENT:  EOMI, sclera anicteric Neck:     No masses; no  thyromegaly Lungs:    Clear to auscultation bilaterally; normal respiratory effort CV:         Regular rate and rhythm; no murmurs Abd:      + bowel sounds; soft, non-tender; no palpable masses, no distension Ext:    No edema; adequate peripheral perfusion Skin:      Warm and dry; no rash Neuro: alert and oriented x 3 Psych: normal mood and affect  Data Reviewed: Imaging: CT chest screening 01/07/2018- small 3.4 mm right lower lobe lung nodule.  Mild emphysematous changes. CTA 03/06/2019-no pulmonary embolism or lung abnormality.  I have reviewed the  images personally  Labs: CBC 05/25/2017-WBC 10.3, eos 1%, absolute eosinophil count 103 CBC 08/22/2019-WBC 8.9, eos 1%, 30 significant count 89 IgE 05/27/2018-7 Hypersensitivity panel 05/27/2018-negative  Assessment:  Assessment for COPD Likely has significant COPD based on smoking history.  She does have emphysematous changes on CT scan Continue Symbicort, Spiriva PFTs   Pleuritic chest pain, likely musculoskeletal Negative CTA earlier this year Get chest x-ray  Active smoker Smoking cessation discussed.  She is willing to quit.  Prescribe nicotine patches Reassess at return visit.  Time spent counseling-5 minutes. She will need to resume low-dose screening CT of the chest  Plan/Recommendations: - Chest x-ray, CBC, IgE, hypersensitivity panel - Continue Symbicort, start Spiriva - Prednisone taper - Smoking cessation with nicotine patches.  Marshell Garfinkel MD [facility] 11/03/2019, 1:27 PM  CC: Claretta Fraise, MD

## 2019-11-03 NOTE — Addendum Note (Signed)
Addended by: Vanessa Barbara on: 11/03/2019 01:54 PM   Modules accepted: Orders

## 2019-11-03 NOTE — Patient Instructions (Signed)
We will get a chest x-ray today Continue your inhalers Work on smoking cessation  We will need to resume your low-dose screening CT of the chest  Follow-up in 6 months  Notification of test results are managed in the following manner: If there are  any recommendations or changes to the  plan of care discussed in office today,  we will contact you and let you know what they are. If you do not hear from [place], then your results are normal and you can view them through your  MyChart account , or a letter will be sent to you. Thank you again for trusting [place] with your care ,  [facility].

## 2019-11-11 DIAGNOSIS — M6281 Muscle weakness (generalized): Secondary | ICD-10-CM | POA: Diagnosis not present

## 2019-11-14 ENCOUNTER — Other Ambulatory Visit: Payer: Self-pay | Admitting: Family Medicine

## 2019-11-14 ENCOUNTER — Telehealth: Payer: Self-pay | Admitting: Family Medicine

## 2019-11-14 MED ORDER — BENZONATATE 200 MG PO CAPS: 200.0000 mg | ORAL_CAPSULE | Freq: Three times a day (TID) | ORAL | 0 refills | Status: DC | PRN

## 2019-11-14 MED ORDER — AZITHROMYCIN 250 MG PO TABS: ORAL_TABLET | ORAL | 0 refills | Status: DC

## 2019-11-14 MED ORDER — APIXABAN 5 MG PO TABS: ORAL_TABLET | ORAL | 0 refills | Status: DC

## 2019-11-14 NOTE — Telephone Encounter (Signed)
Can we send her to the infusion center? I will send in meds too

## 2019-11-15 NOTE — Telephone Encounter (Signed)
Patient aware and states that she is feeling some better at this time and would like to wait a couple of days to see if prescribed medications will help before being sent to infusion center.  Advised patient to call office if symptoms do not get better so this office can get her set up at the infusion center.  Also advised patient if she has any worsening symptoms such as worsening dyspnea or high fever to seek medical care asap

## 2019-11-23 ENCOUNTER — Ambulatory Visit: Payer: Medicare Other | Admitting: Dermatology

## 2019-11-24 ENCOUNTER — Ambulatory Visit: Payer: Medicare Other | Admitting: Dermatology

## 2019-12-01 ENCOUNTER — Encounter: Payer: Self-pay | Admitting: *Deleted

## 2019-12-05 ENCOUNTER — Ambulatory Visit (INDEPENDENT_AMBULATORY_CARE_PROVIDER_SITE_OTHER): Payer: Medicare Other | Admitting: Adult Health

## 2019-12-05 ENCOUNTER — Other Ambulatory Visit: Payer: Self-pay

## 2019-12-05 ENCOUNTER — Encounter: Payer: Self-pay | Admitting: Adult Health

## 2019-12-05 ENCOUNTER — Ambulatory Visit (INDEPENDENT_AMBULATORY_CARE_PROVIDER_SITE_OTHER): Payer: Medicare Other | Admitting: Pulmonary Disease

## 2019-12-05 VITALS — BP 122/82 | HR 86 | Temp 98.1°F | Ht 65.0 in | Wt 154.0 lb

## 2019-12-05 DIAGNOSIS — F17200 Nicotine dependence, unspecified, uncomplicated: Secondary | ICD-10-CM

## 2019-12-05 DIAGNOSIS — Z23 Encounter for immunization: Secondary | ICD-10-CM | POA: Diagnosis not present

## 2019-12-05 DIAGNOSIS — J449 Chronic obstructive pulmonary disease, unspecified: Secondary | ICD-10-CM

## 2019-12-05 LAB — PULMONARY FUNCTION TEST
DL/VA % pred: 109 %
DL/VA: 4.54 ml/min/mmHg/L
DLCO cor % pred: 90 %
DLCO cor: 18.45 ml/min/mmHg
DLCO unc % pred: 90 %
DLCO unc: 18.45 ml/min/mmHg
FEF 25-75 Post: 0.94 L/sec
FEF 25-75 Pre: 0.9 L/sec
FEF2575-%Change-Post: 4 %
FEF2575-%Pred-Post: 44 %
FEF2575-%Pred-Pre: 41 %
FEV1-%Change-Post: 0 %
FEV1-%Pred-Post: 63 %
FEV1-%Pred-Pre: 64 %
FEV1-Post: 1.58 L
FEV1-Pre: 1.59 L
FEV1FVC-%Change-Post: -4 %
FEV1FVC-%Pred-Pre: 87 %
FEV6-%Change-Post: 4 %
FEV6-%Pred-Post: 78 %
FEV6-%Pred-Pre: 75 %
FEV6-Post: 2.46 L
FEV6-Pre: 2.35 L
FEV6FVC-%Change-Post: 0 %
FEV6FVC-%Pred-Post: 103 %
FEV6FVC-%Pred-Pre: 103 %
FVC-%Change-Post: 4 %
FVC-%Pred-Post: 76 %
FVC-%Pred-Pre: 72 %
FVC-Post: 2.47 L
FVC-Pre: 2.36 L
Post FEV1/FVC ratio: 64 %
Post FEV6/FVC ratio: 100 %
Pre FEV1/FVC ratio: 67 %
Pre FEV6/FVC Ratio: 100 %
RV % pred: 120 %
RV: 2.61 L
TLC % pred: 121 %
TLC: 6.31 L

## 2019-12-05 NOTE — Progress Notes (Signed)
@Patient  ID: [name], female    DOB: Jul 09, 1955, 64 y.o.   MRN: [id number]  Chief Complaint  Patient presents with  . Follow-up    COPD     Referring provider: Claretta Fraise, MD  HPI: 64 year old female active smoker  followed for COPD   TEST/EVENTS :  Pets: Has 2 dogs, no birds, farm animals Occupation: Used to work in a Research officer, trade union.  Currently on disability Exposures: Reports living in a home with significant mold exposure for the past 10 months. Smoking history: 50 pack year smoker.  Continues to smoke 1 pack/day Travel history: No significant travel history Relevant family history: Sister has COPD.  CT chest screening 01/07/2018- small 3.4 mm right lower lobe lung nodule.  Mild emphysematous changes. CTA 03/06/2019-no pulmonary embolism or lung abnormality.  I have reviewed the images personally  Labs: CBC 05/25/2017-WBC 10.3, eos 1%, absolute eosinophil count 103 CBC 08/22/2019-WBC 8.9, eos 1%, 30 significant count 89 IgE 05/27/2018-7 Hypersensitivity panel 05/27/2018-negative  12/05/2019 Follow up : COPD  Patient presents for a follow-up visit.  Patient has underlying COPD.  She is an active smoker.  Patient was set up for pulmonary function testing that was done today.  This showed a moderate airflow obstruction.  FEV1 was 64%, ratio 67, FVC 72%, no significant bronchodilator response, DLCO is 90%. Patient is maintained on Symbicort and Spiriva.  Patient says overall breathing is doing about the same.  She gets short of breath with mild activities.  Has some intermittent cough and congestion.  She does continue to smoke.  Smoking cessation was discussed.  Patient has been part of the low-dose CT screening program.  She denies any chest pain orthopnea PND or leg swelling.   Allergies  Allergen Reactions  . Lorcet 10-650 [Hydrocodone-Acetaminophen] Itching  . Sulfa Antibiotics Other (See Comments)    aching  . Tape Other (See Comments)    Please use paper tape     Immunization History  Administered Date(s) Administered  . Fluad Quad(high Dose 65+) 12/10/2018  . Influenza, High Dose Seasonal PF 12/05/2019  . Influenza,inj,Quad PF,6+ Mos 02/11/2013, 05/17/2015, 12/03/2015, 12/26/2016, 12/16/2017  . Influenza-Unspecified 12/15/2018  . Moderna SARS-COVID-2 Vaccination 05/05/2019, 06/17/2019  . Pneumococcal Conjugate-13 08/22/2019  . Tdap 08/22/2019  . Zoster 06/02/2014    Past Medical History:  Diagnosis Date  . Allergy   . Arthritis   . Atypical mole 06/05/2003   slight-mod-Left scapula (WS)  . Atypical nevus 07/26/2003   persist. dysp- Left scapula (WS)  . Basal cell carcinoma 06/05/2003   RIght chest (CX35FU)  . Basal cell carcinoma 05/22/2004   superificial- RIght upper back- (CX35FU)  . Basal cell carcinoma 11/27/2004   beside R nostril-(MOHS), below right outer nose (MOHS)  . Basal cell carcinoma 11/08/2009   left post shoulder -(txpbx)  . Basal cell carcinoma 05/24/2012   ulcerated- Left post shoulder- (CX35FU), ulcerated-mid forehead (EXC )  . Basal cell carcinoma 06/24/2012   superificial- Right shoulder - (txpbx)   . Basal cell carcinoma 07/29/2012    sclerosis- mid forehead (MOHS)  . Basal cell carcinoma 06/06/2019   nod-right anterior neck (CX35FU)  . Constipation    uses OTC meds with help  . COPD (chronic obstructive pulmonary disease) ([place])   . Coronary artery disease    minimal nonobstructive   . Depression   . GERD (gastroesophageal reflux disease)   . Hx of adenomatous polyp of colon 10/03/2014  . Hypercholesteremia   . Hypertension   . Panic  attacks   . Perimenopausal   . Skin cancer   . Squamous cell carcinoma of skin 06/05/2003   Right [place](CX35FU)  . Squamous cell carcinoma of skin 05/22/2004   in situ- Left upperarm (CX35FU)  . Squamous cell carcinoma of skin 11/27/2004   in situ- above Left elbow (CX35FU)  . Squamous cell carcinoma of skin 03/04/2006   in situ- Left sideburn (CX35FU)  .  Squamous cell carcinoma of skin 11/26/2006   in situ- mid brow (Cx35FU)  . Squamous cell carcinoma of skin 05/24/2012   in situ- Left chest- (CX35FU)  . Squamous cell carcinoma of skin 04/05/2013   in situ- Left nose (Cx35FU)  . Squamous cell carcinoma of skin 08/17/2013   well diff-above Left eye (txpbx)  . Squamous cell carcinoma of skin 07/30/2016   in situ- right chest sup (Cx35FU)  . Squamous cell carcinoma of skin 05/12/2017   in situ- RIght shoulder (CX35FU), in situ- Left forehead (CX35FU)  . Squamous cell carcinoma of skin 11/23/2018   in situ- Right mid shin, inner- (MOHS)  . Tobacco user     Tobacco History: Social History   Tobacco Use  Smoking Status Current Every Day Smoker  . Packs/day: 0.50  . Years: 40.00  . Pack years: 20.00  . Types: Cigarettes  . Start date: 05/01/1969  Smokeless Tobacco Never Used  Tobacco Comment   would like information. has tried chantix and wellbutrin   Ready to quit: No Counseling given: Yes Comment: would like information. has tried chantix and wellbutrin   Outpatient Medications Prior to Visit  Medication Sig Dispense Refill  . albuterol (VENTOLIN HFA) 108 (90 Base) MCG/ACT inhaler Inhale 2 puffs into the lungs every 4 (four) hours as needed for wheezing or shortness of breath. 3 Inhaler 3  . amLODipine (NORVASC) 5 MG tablet Take 1 tablet (5 mg total) by mouth daily. 90 tablet 3  . apixaban (ELIQUIS) 5 MG TABS tablet 2 BID X 7 days then 1 BID 75 tablet 0  . Ascorbic Acid (VITAMIN C) 1000 MG tablet Take 1,000 mg by mouth daily.    Marland Kitchen aspirin EC 81 MG tablet Take 81 mg by mouth daily.    Marland Kitchen atorvastatin (LIPITOR) 40 MG tablet Take 1 tablet (40 mg total) by mouth at bedtime. 90 tablet 3  . azithromycin (ZITHROMAX Z-PAK) 250 MG tablet Take two right away Then one a day for the next 4 days. 6 each 0  . benzonatate (TESSALON) 200 MG capsule Take 1 capsule (200 mg total) by mouth 3 (three) times daily as needed for cough. 20 capsule 0  .  budesonide-formoterol (SYMBICORT) 160-4.5 MCG/ACT inhaler INHALE TWO PUFFS INTO THE LUNGS BY MOUTH TWICE DAILY 10.2 g 2  . clonazePAM (KLONOPIN) 1 MG tablet Take 1 tablet (1 mg total) by mouth 3 (three) times daily as needed. for anxiety 90 tablet 2  . cloNIDine (CATAPRES) 0.1 MG tablet Take 1 tablet (0.1 mg total) by mouth at bedtime. 90 tablet 1  . dexlansoprazole (DEXILANT) 60 MG capsule Take 1 capsule (60 mg total) by mouth daily. On an empty stomach, then do not eat for 1 hour (replaces pantoprazole for reflux) 90 capsule 1  . DULoxetine (CYMBALTA) 60 MG capsule Take 1 capsule (60 mg total) by mouth 2 (two) times daily. 60 capsule 2  . fish oil-omega-3 fatty acids 1000 MG capsule Take 1 g by mouth daily.     Marland Kitchen gabapentin (NEURONTIN) 300 MG capsule Take 300-600 mg by mouth  See admin instructions. Take 1 capsule in the morning and 2 capsule at bedtime    . gabapentin (NEURONTIN) 600 MG tablet Take 600 mg by mouth 3 (three) times daily.    Marland Kitchen ipratropium-albuterol (DUONEB) 0.5-2.5 (3) MG/3ML SOLN Take 3 mLs by nebulization every 6 (six) hours as needed. 360 mL 5  . lisinopril (ZESTRIL) 20 MG tablet Take 1 tablet (20 mg total) by mouth daily. 90 tablet 3  . meloxicam (MOBIC) 15 MG tablet Take 15 mg by mouth daily.    . Oxycodone HCl 10 MG TABS Take 10 mg by mouth every 4 (four) hours as needed.    Marland Kitchen SPIRIVA RESPIMAT 2.5 MCG/ACT AERS INHALE TWO PUFFS INTO THE LUNGS DAILY 4 g 2   No facility-administered medications prior to visit.     Review of Systems:   Constitutional:   No  weight loss, night sweats,  Fevers, chills,  +fatigue, or  lassitude.  HEENT:   No headaches,  Difficulty swallowing,  Tooth/dental problems, or  Sore throat,                No sneezing, itching, ear ache, nasal congestion, post nasal drip,   CV:  No chest pain,  Orthopnea, PND, swelling in lower extremities, anasarca, dizziness, palpitations, syncope.   GI  No heartburn, indigestion, abdominal pain, nausea,  vomiting, diarrhea, change in bowel habits, loss of appetite, bloody stools.   Resp:  .  No chest wall deformity  Skin: no rash or lesions.  GU: no dysuria, change in color of urine, no urgency or frequency.  No flank pain, no hematuria   MS:  No joint pain or swelling.  No decreased range of motion.  No back pain.    Physical Exam  BP 122/82 (BP Location: Left Arm, Cuff Size: Normal)   Pulse 86   Temp 98.1 F (36.7 C) (Oral)   Ht 5\' 5"  (1.651 m)   Wt 154 lb (69.9 kg)   SpO2 94%   BMI 25.63 kg/m   GEN: A/Ox3; pleasant , NAD, well nourished    HEENT:  [place]/AT,  , NOSE-clear, THROAT-clear, no lesions, no postnasal drip or exudate noted.   NECK:  Supple w/ fair ROM; no JVD; normal carotid impulses w/o bruits; no thyromegaly or nodules palpated; no lymphadenopathy.    RESP  Clear  P & A; w/o, wheezes/ rales/ or rhonchi. no accessory muscle use, no dullness to percussion  CARD:  RRR, no m/r/g, no peripheral edema, pulses intact, no cyanosis or clubbing.  GI:   Soft & nt; nml bowel sounds; no organomegaly or masses detected.   Musco: Warm bil, no deformities or joint swelling noted.   Neuro: alert, no focal deficits noted.    Skin: Warm, no lesions or rashes    Lab Results:  CBC   BNP  ProBNP No results found for: PROBNP  Imaging: No results found.    PFT Results Latest Ref Rng & Units 12/05/2019  FVC-Pre L 2.36  FVC-Predicted Pre % 72  FVC-Post L 2.47  FVC-Predicted Post % 76  Pre FEV1/FVC % % 67  Post FEV1/FCV % % 64  FEV1-Pre L 1.59  FEV1-Predicted Pre % 64  FEV1-Post L 1.58  DLCO uncorrected ml/min/mmHg 18.45  DLCO UNC% % 90  DLCO corrected ml/min/mmHg 18.45  DLCO COR %Predicted % 90  DLVA Predicted % 109  TLC L 6.31  TLC % Predicted % 121  RV % Predicted % 120    No results found  for: NITRICOXIDE      Assessment & Plan:   COPD (chronic obstructive pulmonary disease) Moderate COPD.  Patient is encouraged on smoking cessation.  She is  to continue on her triple therapy with Symbicort and Spiriva.  Continue with participation in the low-dose CT screening program.  Plan  Patient Instructions  Continue on Symbicort 2 puffs Twice daily  , rinse after use  Continue on Spiriva 2 puffs daily, rinse after use.  Activity as tolerated.  Mucinex DM Twice daily  As needed  Cough/congestion .  Flu shot today .  Work on quitting smoking  Refer to LDCT chest screening program, -resume 03/2020.  Follow up with Dr. Vaughan Browner in 4-6 months and As needed        TOBACCO ABUSE Smoking cessation was discussed.     Rexene Edison, NP 12/05/2019

## 2019-12-05 NOTE — Progress Notes (Signed)
Full PFT performed today. °

## 2019-12-05 NOTE — Assessment & Plan Note (Signed)
Smoking cessation was discussed. 

## 2019-12-05 NOTE — Patient Instructions (Addendum)
Continue on Symbicort 2 puffs Twice daily  , rinse after use  Continue on Spiriva 2 puffs daily, rinse after use.  Activity as tolerated.  Mucinex DM Twice daily  As needed  Cough/congestion .  Flu shot today .  Work on quitting smoking  Refer to LDCT chest screening program, -resume 03/2020.  Follow up with Dr. Vaughan Browner in 4-6 months and As needed

## 2019-12-05 NOTE — Assessment & Plan Note (Signed)
Moderate COPD.  Patient is encouraged on smoking cessation.  She is to continue on her triple therapy with Symbicort and Spiriva.  Continue with participation in the low-dose CT screening program.  Plan  Patient Instructions  Continue on Symbicort 2 puffs Twice daily  , rinse after use  Continue on Spiriva 2 puffs daily, rinse after use.  Activity as tolerated.  Mucinex DM Twice daily  As needed  Cough/congestion .  Flu shot today .  Work on quitting smoking  Refer to LDCT chest screening program, -resume 03/2020.  Follow up with Dr. Vaughan Browner in 4-6 months and As needed

## 2019-12-12 ENCOUNTER — Encounter: Payer: Self-pay | Admitting: Internal Medicine

## 2019-12-12 ENCOUNTER — Ambulatory Visit (INDEPENDENT_AMBULATORY_CARE_PROVIDER_SITE_OTHER): Payer: Medicare Other | Admitting: Internal Medicine

## 2019-12-12 VITALS — BP 142/86 | HR 91 | Ht 65.0 in | Wt 155.1 lb

## 2019-12-12 DIAGNOSIS — T402X5A Adverse effect of other opioids, initial encounter: Secondary | ICD-10-CM

## 2019-12-12 DIAGNOSIS — M6281 Muscle weakness (generalized): Secondary | ICD-10-CM | POA: Diagnosis not present

## 2019-12-12 DIAGNOSIS — K5903 Drug induced constipation: Secondary | ICD-10-CM | POA: Diagnosis not present

## 2019-12-12 DIAGNOSIS — Z7901 Long term (current) use of anticoagulants: Secondary | ICD-10-CM

## 2019-12-12 DIAGNOSIS — K219 Gastro-esophageal reflux disease without esophagitis: Secondary | ICD-10-CM

## 2019-12-12 DIAGNOSIS — R1319 Other dysphagia: Secondary | ICD-10-CM

## 2019-12-12 DIAGNOSIS — R635 Abnormal weight gain: Secondary | ICD-10-CM

## 2019-12-12 NOTE — Patient Instructions (Addendum)
You have been scheduled for an endoscopy. Please follow written instructions given to you at your visit today. If you use inhalers (even only as needed), please bring them with you on the day of your procedure.  PLEASE CHECK TO SEE IF YOU ARE TAKING THE DRUG APIXABAN WHICH IS GENERIC ELIQUIS. CALL AND LET ME KNOW. ASK FOR PJ. THANK YOU.  We are providing you with a GERD handout to read and follow.   You may take an over the counter Dulcolax every second or third night to help with your constipation.   I appreciate the opportunity to care for you. Silvano Rusk, MD, [facility]

## 2019-12-12 NOTE — Progress Notes (Signed)
BREONIA KIRSTEIN 64 y.o. August 24, 1955 [id number]  Assessment & Plan:   Encounter Diagnoses  Name Primary?  . Esophageal dysphagia Yes  . Gastroesophageal reflux disease, unspecified whether esophagitis present   . On continuous oral anticoagulation???   . Weight gain   . Constipation due to opioid therapy-suspected     The patient needs an upper endoscopy to evaluate worsening reflux symptoms despite Dexilant and dysphagia.  She has gained weight so I doubt esophageal cancer but it is in the differential.  The weight gain may be what is making reflux worse.  Opioids may be contributing to dysphagia as well she uses a fair amount of oxycodone every day.  This is also most likely causing constipation.  It is unclear if she is taking anticoagulation at this time there was a prescription for Eliquis at the time she was diagnosed with COVID-19.  She does not carry a diagnosis of any type of DVT A. fib etc.  Our suspicion is she is not on it anymore but she needs to confirm this.  If she is it will need to be helped.  In addition to upper endoscopy with possible esophageal dilation plan is as follows:  Clarify if she is still on Eliquis and take appropriate action i.e. home 2 days prior if so.  If she is not on this removed from medication list.  Allow more time between last meal and lying down and other GERD diet measures including smoking cessation and reducing caffeine  Dulcolax as needed what I think is opioid induced constipation  Further plans pending results and clinical course.  I appreciate the opportunity to care for this patient. CC: Claretta Fraise, MD   Subjective:   Chief Complaint: Reflux and dysphagia  HPI Gicela is a 64 year old white woman with COPD, smoker, history of colon polyps, and recent Covid diagnosis despite being vaccinated who is having worsening reflux.  Describes heartburn particularly in the evenings and intermittent solid food dysphagia.  2 months ago  she was changed to Dexilant 60 mg daily which is helping more than prior PPI.  I have never seen her for GERD.  She drinks 1-1/2 cups of coffee and a soda each day that have caffeine, she continues to smoke.  She will often lie down soon after eating her evening meal.  Symptoms of reflux and regurgitation and heartburn occur often at that time.  Wt Readings from Last 3 Encounters:  12/12/19 155 lb 2 oz (70.4 kg)  12/05/19 154 lb (69.9 kg)  11/03/19 153 lb 3.2 oz (69.5 kg)      Colonoscopy in 01/20/2018 with severe sigmoid diverticulosis, she has a prior history of an adenoma and a family history of colon cancer in her brother less than 47. Allergies  Allergen Reactions  . Lorcet 10-650 [Hydrocodone-Acetaminophen] Itching  . Sulfa Antibiotics Other (See Comments)    aching  . Tape Other (See Comments)    Please use paper tape   Current Meds  Medication Sig  . albuterol (VENTOLIN HFA) 108 (90 Base) MCG/ACT inhaler Inhale 2 puffs into the lungs every 4 (four) hours as needed for wheezing or shortness of breath.  Marland Kitchen amLODipine (NORVASC) 5 MG tablet Take 1 tablet (5 mg total) by mouth daily.  Marland Kitchen apixaban (ELIQUIS) 5 MG TABS tablet 2 BID X 7 days then 1 BID  . Ascorbic Acid (VITAMIN C) 1000 MG tablet Take 1,000 mg by mouth daily.  Marland Kitchen aspirin EC 81 MG tablet Take 81 mg by  mouth daily.  Marland Kitchen atorvastatin (LIPITOR) 40 MG tablet Take 1 tablet (40 mg total) by mouth at bedtime.  . budesonide-formoterol (SYMBICORT) 160-4.5 MCG/ACT inhaler INHALE TWO PUFFS INTO THE LUNGS BY MOUTH TWICE DAILY  . clonazePAM (KLONOPIN) 1 MG tablet Take 1 tablet (1 mg total) by mouth 3 (three) times daily as needed. for anxiety  . cloNIDine (CATAPRES) 0.1 MG tablet Take 1 tablet (0.1 mg total) by mouth at bedtime.  Marland Kitchen dexlansoprazole (DEXILANT) 60 MG capsule Take 1 capsule (60 mg total) by mouth daily. On an empty stomach, then do not eat for 1 hour (replaces pantoprazole for reflux)  . DULoxetine (CYMBALTA) 60 MG capsule  Take 1 capsule (60 mg total) by mouth 2 (two) times daily.  . fish oil-omega-3 fatty acids 1000 MG capsule Take 1 g by mouth daily.   Marland Kitchen gabapentin (NEURONTIN) 300 MG capsule Take 300-600 mg by mouth See admin instructions. Take 1 capsule in the morning and 2 capsule at bedtime  . gabapentin (NEURONTIN) 600 MG tablet Take 600 mg by mouth 3 (three) times daily.  Marland Kitchen ipratropium-albuterol (DUONEB) 0.5-2.5 (3) MG/3ML SOLN Take 3 mLs by nebulization every 6 (six) hours as needed.  Marland Kitchen lisinopril (ZESTRIL) 20 MG tablet Take 1 tablet (20 mg total) by mouth daily.  . meloxicam (MOBIC) 15 MG tablet Take 15 mg by mouth daily.  . Oxycodone HCl 10 MG TABS Take 10 mg by mouth every 4 (four) hours as needed.  Marland Kitchen SPIRIVA RESPIMAT 2.5 MCG/ACT AERS INHALE TWO PUFFS INTO THE LUNGS DAILY   Past Medical History:  Diagnosis Date  . Allergy   . Arthritis   . Atypical mole 06/05/2003   slight-mod-Left scapula (WS)  . Atypical nevus 07/26/2003   persist. dysp- Left scapula (WS)  . Basal cell carcinoma 06/05/2003   RIght chest (CX35FU)  . Basal cell carcinoma 05/22/2004   superificial- RIght upper back- (CX35FU)  . Basal cell carcinoma 11/27/2004   beside R nostril-(MOHS), below right outer nose (MOHS)  . Basal cell carcinoma 11/08/2009   left post shoulder -(txpbx)  . Basal cell carcinoma 05/24/2012   ulcerated- Left post shoulder- (CX35FU), ulcerated-mid forehead (EXC )  . Basal cell carcinoma 06/24/2012   superificial- Right shoulder - (txpbx)   . Basal cell carcinoma 07/29/2012    sclerosis- mid forehead (MOHS)  . Basal cell carcinoma 06/06/2019   nod-right anterior neck (CX35FU)  . Constipation    uses OTC meds with help  . COPD (chronic obstructive pulmonary disease) ([place])   . Coronary artery disease    minimal nonobstructive   . COVID   . Depression   . GERD (gastroesophageal reflux disease)   . Hx of adenomatous polyp of colon 10/03/2014  . Hypercholesteremia   . Hypertension   . Panic attacks     . Perimenopausal   . Skin cancer   . Squamous cell carcinoma of skin 06/05/2003   Right Temple(CX35FU)  . Squamous cell carcinoma of skin 05/22/2004   in situ- Left upperarm (CX35FU)  . Squamous cell carcinoma of skin 11/27/2004   in situ- above Left elbow (CX35FU)  . Squamous cell carcinoma of skin 03/04/2006   in situ- Left sideburn (CX35FU)  . Squamous cell carcinoma of skin 11/26/2006   in situ- mid brow (Cx35FU)  . Squamous cell carcinoma of skin 05/24/2012   in situ- Left chest- (CX35FU)  . Squamous cell carcinoma of skin 04/05/2013   in situ- Left nose (Cx35FU)  . Squamous cell carcinoma of skin  08/17/2013   well diff-above Left eye (txpbx)  . Squamous cell carcinoma of skin 07/30/2016   in situ- right chest sup (Cx35FU)  . Squamous cell carcinoma of skin 05/12/2017   in situ- RIght shoulder (CX35FU), in situ- Left forehead (CX35FU)  . Squamous cell carcinoma of skin 11/23/2018   in situ- Right mid shin, inner- (MOHS)  . Tobacco user    Past Surgical History:  Procedure Laterality Date  . ABDOMINAL HYSTERECTOMY    . COLONOSCOPY    . Right foot toe surgery    . UPPER GASTROINTESTINAL ENDOSCOPY    . vocal cord     polyp removal   Social History   Social History Narrative   Single   Smoker   No alcohol or drug use reported         family history includes Alcohol abuse in her brother; Anxiety disorder in her brother and sister; Colon cancer (age of onset: 38) in her brother; Colon polyps in her brother, brother, sister, and sister; Depression in her brother and sister; Drug abuse in her son; Esophageal cancer in her sister; Heart attack in her brother, father, and mother; Heart disease in her mother.   Review of Systems As per HPI  Objective:   Physical Exam BP (!) 142/86   Pulse 91   Ht 5\' 5"  (1.651 m)   Wt 155 lb 2 oz (70.4 kg)   BMI 25.81 kg/m  nad Lungs cta Cor 2/6 sem abd soft nt Skin numerous scars from prior skin cancer removal

## 2019-12-13 ENCOUNTER — Ambulatory Visit: Payer: Medicare Other | Admitting: *Deleted

## 2019-12-13 ENCOUNTER — Telehealth: Payer: Self-pay

## 2019-12-13 DIAGNOSIS — E782 Mixed hyperlipidemia: Secondary | ICD-10-CM

## 2019-12-13 DIAGNOSIS — I10 Essential (primary) hypertension: Secondary | ICD-10-CM

## 2019-12-13 DIAGNOSIS — J449 Chronic obstructive pulmonary disease, unspecified: Secondary | ICD-10-CM

## 2019-12-13 NOTE — Chronic Care Management (AMB) (Signed)
Chronic Care Management   Initial Visit Note  12/13/2019 Name: [name] MRN: [id number] DOB: 21-Jan-1956  Referred by: Kerry Fraise, MD Reason for referral : Chronic Care Management (Initial Visit)   [name] is a 64 y.o. year old female who is a primary care patient of Stacks, Cletus Gash, MD. The CCM team was consulted for assistance with chronic disease management and care coordination needs related to HTN, HLD and COPD  Review of patient status, including review of consultants reports, relevant laboratory and other test results, and collaboration with appropriate care team members and the patient's provider was performed as part of comprehensive patient evaluation and provision of chronic care management services.    SDOH (Social Determinants of Health) assessments performed: Yes See Care Plan activities for detailed interventions related to SDOH    I spoke with [name] by telephone today. She does not have any CCM or resource needs and doesn't feel that participation in the CCM program is needed at this time. She will reach out to the CCM team if that changes.   Medications: Outpatient Encounter Medications as of 12/13/2019  Medication Sig Note  . albuterol (VENTOLIN HFA) 108 (90 Base) MCG/ACT inhaler Inhale 2 puffs into the lungs every 4 (four) hours as needed for wheezing or shortness of breath.   Marland Kitchen amLODipine (NORVASC) 5 MG tablet Take 1 tablet (5 mg total) by mouth daily.   Marland Kitchen apixaban (ELIQUIS) 5 MG TABS tablet 2 BID X 7 days then 1 BID   . Ascorbic Acid (VITAMIN C) 1000 MG tablet Take 1,000 mg by mouth daily.   Marland Kitchen aspirin EC 81 MG tablet Take 81 mg by mouth daily. 09/14/2014: Bruising   . atorvastatin (LIPITOR) 40 MG tablet Take 1 tablet (40 mg total) by mouth at bedtime.   . budesonide-formoterol (SYMBICORT) 160-4.5 MCG/ACT inhaler INHALE TWO PUFFS INTO THE LUNGS BY MOUTH TWICE DAILY   . clonazePAM (KLONOPIN) 1 MG tablet Take 1 tablet (1 mg total) by mouth 3 (three)  times daily as needed. for anxiety   . cloNIDine (CATAPRES) 0.1 MG tablet Take 1 tablet (0.1 mg total) by mouth at bedtime.   Marland Kitchen dexlansoprazole (DEXILANT) 60 MG capsule Take 1 capsule (60 mg total) by mouth daily. On an empty stomach, then do not eat for 1 hour (replaces pantoprazole for reflux)   . DULoxetine (CYMBALTA) 60 MG capsule Take 1 capsule (60 mg total) by mouth 2 (two) times daily.   . fish oil-omega-3 fatty acids 1000 MG capsule Take 1 g by mouth daily.    Marland Kitchen gabapentin (NEURONTIN) 300 MG capsule Take 300-600 mg by mouth See admin instructions. Take 1 capsule in the morning and 2 capsule at bedtime   . gabapentin (NEURONTIN) 600 MG tablet Take 600 mg by mouth 3 (three) times daily.   Marland Kitchen ipratropium-albuterol (DUONEB) 0.5-2.5 (3) MG/3ML SOLN Take 3 mLs by nebulization every 6 (six) hours as needed.   Marland Kitchen lisinopril (ZESTRIL) 20 MG tablet Take 1 tablet (20 mg total) by mouth daily.   . meloxicam (MOBIC) 15 MG tablet Take 15 mg by mouth daily.   . Oxycodone HCl 10 MG TABS Take 10 mg by mouth every 4 (four) hours as needed.   Marland Kitchen SPIRIVA RESPIMAT 2.5 MCG/ACT AERS INHALE TWO PUFFS INTO THE LUNGS DAILY    No facility-administered encounter medications on file as of 12/13/2019.    Plan:   The patient has been provided with contact information for the care management team and  has been advised to call with any health related questions or concerns.  CCM enrollment status changed to "previously enrolled" as per patient request on 12/13/2019 to discontinue enrollment. Case closed to case management services in primary care home.   [name], BSN, RN-BC Embedded Chronic Care Manager [facility] Direct Dial: [phone]

## 2019-12-13 NOTE — Patient Instructions (Signed)
Plan:   The patient has been provided with contact information for the care management team and has been advised to call with any health related questions or concerns.  CCM enrollment status changed to "previously enrolled" as per patient request on 12/13/2019 to discontinue enrollment. Case closed to case management services in primary care home.   Chong Sicilian, BSN, RN-BC Embedded Chronic Care Manager [facility] [place] [facility] Direct Dial: [phone]

## 2019-12-13 NOTE — Telephone Encounter (Signed)
I spoke with [name] and she confirmed she is NOT on Eliquis. I will remove it from her list.

## 2019-12-15 ENCOUNTER — Other Ambulatory Visit: Payer: Self-pay

## 2019-12-15 ENCOUNTER — Ambulatory Visit (INDEPENDENT_AMBULATORY_CARE_PROVIDER_SITE_OTHER): Payer: Medicare Other | Admitting: Cardiology

## 2019-12-15 ENCOUNTER — Encounter: Payer: Self-pay | Admitting: Cardiology

## 2019-12-15 VITALS — BP 142/98 | HR 71 | Ht 65.0 in | Wt 155.0 lb

## 2019-12-15 DIAGNOSIS — I2510 Atherosclerotic heart disease of native coronary artery without angina pectoris: Secondary | ICD-10-CM

## 2019-12-15 DIAGNOSIS — R011 Cardiac murmur, unspecified: Secondary | ICD-10-CM | POA: Diagnosis not present

## 2019-12-15 DIAGNOSIS — R079 Chest pain, unspecified: Secondary | ICD-10-CM | POA: Diagnosis not present

## 2019-12-15 DIAGNOSIS — I10 Essential (primary) hypertension: Secondary | ICD-10-CM | POA: Diagnosis not present

## 2019-12-15 MED ORDER — AMLODIPINE BESYLATE 10 MG PO TABS: 10.0000 mg | ORAL_TABLET | Freq: Every day | ORAL | 1 refills | Status: DC

## 2019-12-15 NOTE — Patient Instructions (Addendum)
Your physician wants you to follow-up in: [place] will receive a reminder letter in the mail two months in advance. If you don't receive a letter, please call our office to schedule the follow-up appointment.  Your physician has recommended you make the following change in your medication:  INCREASE AMLODIPINE 10 MG DAILY   Your physician has requested that you have an echocardiogram. Echocardiography is a painless test that uses sound waves to create images of your heart. It provides your doctor with information about the size and shape of your heart and how well your heart's chambers and valves are working. This procedure takes approximately one hour. There are no restrictions for this procedure.  Thank you for choosing [facility]!!

## 2019-12-15 NOTE — Progress Notes (Signed)
Clinical Summary Ms. Halls is a 64 y.o.female  seen today for follow up of the following medical problems.  1. Atypical chest pain/CAD - long history of atypical chest pain - cath 2008 nonobstrucive CAD - 07/2013 nuclear stress: no ischemia 01/2018 CT for lung cancer screening showed aortic atherosclerosis, with LM disease, LAD, and LCX   CAD risk factors: hyperlipidemia, HTN, +tobacco, both parents MIs in 22 to 77s, brother MI in his 60s  Jan 2020 nuclear stress no ischemia  - mild chest pains at times, mid chest or at times in rib cage. Can occur with position changes     2. COPD - followed by pulmonary    3. HTN - compliant with med  4. COVID - recent infection 6 weeks ago, did not require hosplitalization  4. Hyperlipidemia - 08/2019 LDL 105 - compliant with statin   Had moderna vaccine x 2   Past Medical History:  Diagnosis Date  . Allergy   . Arthritis   . Atypical mole 06/05/2003   slight-mod-Left scapula (WS)  . Atypical nevus 07/26/2003   persist. dysp- Left scapula (WS)  . Basal cell carcinoma 06/05/2003   RIght chest (CX35FU)  . Basal cell carcinoma 05/22/2004   superificial- RIght upper back- (CX35FU)  . Basal cell carcinoma 11/27/2004   beside R nostril-(MOHS), below right outer nose (MOHS)  . Basal cell carcinoma 11/08/2009   left post shoulder -(txpbx)  . Basal cell carcinoma 05/24/2012   ulcerated- Left post shoulder- (CX35FU), ulcerated-mid forehead (EXC )  . Basal cell carcinoma 06/24/2012   superificial- Right shoulder - (txpbx)   . Basal cell carcinoma 07/29/2012    sclerosis- mid forehead (MOHS)  . Basal cell carcinoma 06/06/2019   nod-right anterior neck (CX35FU)  . Constipation    uses OTC meds with help  . COPD (chronic obstructive pulmonary disease) ([place])   . Coronary artery disease    minimal nonobstructive   . COVID   . Depression   . GERD (gastroesophageal reflux disease)   . Hx of adenomatous polyp  of colon 10/03/2014  . Hypercholesteremia   . Hypertension   . Panic attacks   . Perimenopausal   . Skin cancer   . Squamous cell carcinoma of skin 06/05/2003   Right Temple(CX35FU)  . Squamous cell carcinoma of skin 05/22/2004   in situ- Left upperarm (CX35FU)  . Squamous cell carcinoma of skin 11/27/2004   in situ- above Left elbow (CX35FU)  . Squamous cell carcinoma of skin 03/04/2006   in situ- Left sideburn (CX35FU)  . Squamous cell carcinoma of skin 11/26/2006   in situ- mid brow (Cx35FU)  . Squamous cell carcinoma of skin 05/24/2012   in situ- Left chest- (CX35FU)  . Squamous cell carcinoma of skin 04/05/2013   in situ- Left nose (Cx35FU)  . Squamous cell carcinoma of skin 08/17/2013   well diff-above Left eye (txpbx)  . Squamous cell carcinoma of skin 07/30/2016   in situ- right chest sup (Cx35FU)  . Squamous cell carcinoma of skin 05/12/2017   in situ- RIght shoulder (CX35FU), in situ- Left forehead (CX35FU)  . Squamous cell carcinoma of skin 11/23/2018   in situ- Right mid shin, inner- (MOHS)  . Tobacco user      Allergies  Allergen Reactions  . Lorcet 10-650 [Hydrocodone-Acetaminophen] Itching  . Sulfa Antibiotics Other (See Comments)    aching  . Tape Other (See Comments)    Please use paper tape     Current  Outpatient Medications  Medication Sig Dispense Refill  . albuterol (VENTOLIN HFA) 108 (90 Base) MCG/ACT inhaler Inhale 2 puffs into the lungs every 4 (four) hours as needed for wheezing or shortness of breath. 3 Inhaler 3  . amLODipine (NORVASC) 5 MG tablet Take 1 tablet (5 mg total) by mouth daily. 90 tablet 3  . Ascorbic Acid (VITAMIN C) 1000 MG tablet Take 1,000 mg by mouth daily.    Marland Kitchen aspirin EC 81 MG tablet Take 81 mg by mouth daily.    Marland Kitchen atorvastatin (LIPITOR) 40 MG tablet Take 1 tablet (40 mg total) by mouth at bedtime. 90 tablet 3  . budesonide-formoterol (SYMBICORT) 160-4.5 MCG/ACT inhaler INHALE TWO PUFFS INTO THE LUNGS BY MOUTH TWICE DAILY  10.2 g 2  . clonazePAM (KLONOPIN) 1 MG tablet Take 1 tablet (1 mg total) by mouth 3 (three) times daily as needed. for anxiety 90 tablet 2  . cloNIDine (CATAPRES) 0.1 MG tablet Take 1 tablet (0.1 mg total) by mouth at bedtime. 90 tablet 1  . dexlansoprazole (DEXILANT) 60 MG capsule Take 1 capsule (60 mg total) by mouth daily. On an empty stomach, then do not eat for 1 hour (replaces pantoprazole for reflux) 90 capsule 1  . DULoxetine (CYMBALTA) 60 MG capsule Take 1 capsule (60 mg total) by mouth 2 (two) times daily. 60 capsule 2  . fish oil-omega-3 fatty acids 1000 MG capsule Take 1 g by mouth daily.     Marland Kitchen gabapentin (NEURONTIN) 300 MG capsule Take 300-600 mg by mouth See admin instructions. Take 1 capsule in the morning and 2 capsule at bedtime    . gabapentin (NEURONTIN) 600 MG tablet Take 600 mg by mouth 3 (three) times daily.    Marland Kitchen ipratropium-albuterol (DUONEB) 0.5-2.5 (3) MG/3ML SOLN Take 3 mLs by nebulization every 6 (six) hours as needed. 360 mL 5  . lisinopril (ZESTRIL) 20 MG tablet Take 1 tablet (20 mg total) by mouth daily. 90 tablet 3  . meloxicam (MOBIC) 15 MG tablet Take 15 mg by mouth daily.    . Oxycodone HCl 10 MG TABS Take 10 mg by mouth every 4 (four) hours as needed.    Marland Kitchen SPIRIVA RESPIMAT 2.5 MCG/ACT AERS INHALE TWO PUFFS INTO THE LUNGS DAILY 4 g 2   No current facility-administered medications for this visit.     Past Surgical History:  Procedure Laterality Date  . ABDOMINAL HYSTERECTOMY    . COLONOSCOPY    . Right foot toe surgery    . UPPER GASTROINTESTINAL ENDOSCOPY    . vocal cord     polyp removal     Allergies  Allergen Reactions  . Lorcet 10-650 [Hydrocodone-Acetaminophen] Itching  . Sulfa Antibiotics Other (See Comments)    aching  . Tape Other (See Comments)    Please use paper tape      Family History  Problem Relation Age of Onset  . Heart disease Mother   . Heart attack Mother   . Heart attack Father   . Heart attack Brother   . Colon  cancer Brother 31       died at age 60  . Anxiety disorder Brother   . Depression Brother   . Alcohol abuse Brother   . Colon polyps Brother   . Colon polyps Brother   . Colon polyps Sister   . Esophageal cancer Sister   . Anxiety disorder Sister   . Depression Sister   . Colon polyps Sister   . Drug abuse Son   .  Rectal cancer Neg Hx   . Stomach cancer Neg Hx      Social History Ms. Eble reports that she has been smoking cigarettes. She started smoking about 50 years ago. She has a 20.00 pack-year smoking history. She has never used smokeless tobacco. Ms. Carriero reports no history of alcohol use.   Review of Systems CONSTITUTIONAL: No weight loss, fever, chills, weakness or fatigue.  HEENT: Eyes: No visual loss, blurred vision, double vision or yellow sclerae.No hearing loss, sneezing, congestion, runny nose or sore throat.  SKIN: No rash or itching.  CARDIOVASCULAR: per hpi RESPIRATORY: No shortness of breath, cough or sputum.  GASTROINTESTINAL: No anorexia, nausea, vomiting or diarrhea. No abdominal pain or blood.  GENITOURINARY: No burning on urination, no polyuria NEUROLOGICAL: No headache, dizziness, syncope, paralysis, ataxia, numbness or tingling in the extremities. No change in bowel or bladder control.  MUSCULOSKELETAL: No muscle, back pain, joint pain or stiffness.  LYMPHATICS: No enlarged nodes. No history of splenectomy.  PSYCHIATRIC: No history of depression or anxiety.  ENDOCRINOLOGIC: No reports of sweating, cold or heat intolerance. No polyuria or polydipsia.  Marland Kitchen   Physical Examination Today's Vitals   12/15/19 1522  BP: (!) 142/98  Pulse: 71  SpO2: 95%  Weight: 155 lb (70.3 kg)  Height: 5\' 5"  (1.651 m)   Body mass index is 25.79 kg/m.  Gen: resting comfortably, no acute distress HEENT: no scleral icterus, pupils equal round and reactive, no palptable cervical adenopathy,  CV: RRR, 3/6 systoilc murmur rusb, no jvd Resp: Clear to auscultation  bilaterally GI: abdomen is soft, non-tender, non-distended, normal bowel sounds, no hepatosplenomegaly MSK: extremities are warm, no edema.  Skin: warm, no rash Neuro:  no focal deficits Psych: appropriate affect   Diagnostic Studies 06/2006 cath FINDINGS: Aortic pressure 125/78 with a mean of 99. Left ventricular pressure 125/5 with an end-diastolic pressure of 10.  The left mainstem is angiographically normal. It bifurcates into the LAD and left circumflex.  The LAD is a large-caliber vessel that courses down to the LV apex. It gives off a first diagonal Sarrinah Gardin and is a large vessel. There is no significant disease in the LAD or diagonal system.  Left circumflex is a large-caliber vessel. There is a small intermediate Vence Lalor present. There are really no substantial obtuse marginal branches from the left circumflex present. The left circumflex courses down the AV groove and gives off a large posterolateral Zillah Alexie. The mid portion of the left circumflex has a long segment of 30-40% stenosis.  The right coronary artery is a large-caliber vessel. It terminates distally in a PDA and posterior AV segment which gives off two posterolateral branches. The mid portion of the right coronary artery provides a small RV marginal Jessie Cowher, and the proximal portion has a small conus Ronnika Collett present. There are minor luminal irregularities in the proximal portion of the right coronary artery, but there is no significant angiographic disease.  Left ventriculography performed in the 30-degree RAO projection demonstrates normal left ventricular systolic function with an LVEF of 55%. There is no mitral regurgitation.  ASSESSMENT: 1. Nonobstructive coronary artery disease, predominantly involving the left circumflex system. 2. Normal left ventricular function. 3. Intracoronary vascular ultrasound demonstration of minimal plaque in the right  coronary artery.  PLAN: Ms. Fielding will require aggressive medical therapy. She needs tobacco cessation and progressive statin therapy. She will receive her statin therapy through the [facility] protocol. She should be on a daily aspirin as well. She will be a candidate for  early discharge as her chest pain appears to have been noncardiac, and she has had no further pain.   Jan 2020 nuclear stress  There was no ST segment deviation noted during stress.  Defect 1: There is a medium defect of mild severity present in the basal inferior, mid inferior and apical inferior location. This is likely due to soft tissue attenuation. No evidence of ischemia.  This is a low risk study.  Nuclear stress EF: 58%.    Assessment and Plan  1. CAD/Atypical chest pain - long history of atypical chest pain - nonobstructive CAD by cath 2008, negative nuclear stress 2015and more recently Jan 2020 -recent MSK pains that are positional, not cardiac  - continue to monitor.   2. Heart murmur - obtain echo   3. HTN - above goal, increase norvasc to 10mg  daily.        Arnoldo Lenis, M.D.

## 2019-12-26 ENCOUNTER — Other Ambulatory Visit: Payer: Self-pay | Admitting: Family Medicine

## 2019-12-26 ENCOUNTER — Ambulatory Visit: Payer: Medicare Other | Admitting: Dermatology

## 2019-12-26 DIAGNOSIS — J439 Emphysema, unspecified: Secondary | ICD-10-CM

## 2019-12-28 ENCOUNTER — Encounter: Payer: Self-pay | Admitting: Gastroenterology

## 2019-12-28 ENCOUNTER — Other Ambulatory Visit: Payer: Self-pay

## 2019-12-28 ENCOUNTER — Ambulatory Visit (AMBULATORY_SURGERY_CENTER): Payer: Medicare Other | Admitting: Gastroenterology

## 2019-12-28 VITALS — BP 112/69 | HR 76 | Temp 97.3°F | Resp 22 | Ht 65.0 in | Wt 155.0 lb

## 2019-12-28 DIAGNOSIS — R1319 Other dysphagia: Secondary | ICD-10-CM

## 2019-12-28 DIAGNOSIS — K2970 Gastritis, unspecified, without bleeding: Secondary | ICD-10-CM | POA: Diagnosis not present

## 2019-12-28 DIAGNOSIS — K2950 Unspecified chronic gastritis without bleeding: Secondary | ICD-10-CM | POA: Diagnosis not present

## 2019-12-28 DIAGNOSIS — K219 Gastro-esophageal reflux disease without esophagitis: Secondary | ICD-10-CM

## 2019-12-28 DIAGNOSIS — K2990 Gastroduodenitis, unspecified, without bleeding: Secondary | ICD-10-CM

## 2019-12-28 MED ORDER — SODIUM CHLORIDE 0.9 % IV SOLN: 500.0000 mL | Freq: Once | INTRAVENOUS | Status: DC

## 2019-12-28 NOTE — Progress Notes (Signed)
A/ox3, pleased with MAC, report to RN 

## 2019-12-28 NOTE — Progress Notes (Signed)
Called to room to assist during endoscopic procedure.  Patient ID and intended procedure confirmed with present staff. Received instructions for my participation in the procedure from the performing physician.  

## 2019-12-28 NOTE — Patient Instructions (Signed)
YOU HAD AN ENDOSCOPIC PROCEDURE TODAY AT [facility]:   Refer to the procedure report that was given to you for any specific questions about what was found during the examination.  If the procedure report does not answer your questions, please call your gastroenterologist to clarify.  If you requested that your care partner not be given the details of your procedure findings, then the procedure report has been included in a sealed envelope for you to review at your convenience later.  YOU SHOULD EXPECT: Some feelings of bloating in the abdomen. Passage of more gas than usual.  Walking can help get rid of the air that was put into your GI tract during the procedure and reduce the bloating.  Please Note:  You might notice some irritation and congestion in your nose or some drainage.  This is from the oxygen used during your procedure.  There is no need for concern and it should clear up in a day or so.  SYMPTOMS TO REPORT IMMEDIATELY:   Following upper endoscopy (EGD)  Vomiting of blood or coffee ground material  New chest pain or pain under the shoulder blades  Painful or persistently difficult swallowing  New shortness of breath  Fever of 100F or higher  Black, tarry-looking stools  For urgent or emergent issues, a gastroenterologist can be reached at any hour by calling [phone]. Do not use MyChart messaging for urgent concerns.    DIET:  FOLLOW DILATION DIET- SEE HANDOUT!! Drink plenty of fluids but you should avoid alcoholic beverages for 24 hours.  ACTIVITY:  You should plan to take it easy for the rest of today and you should NOT DRIVE or use heavy machinery until tomorrow (because of the sedation medicines used during the test).    FOLLOW UP: Our staff will call the number listed on your records 48-72 hours following your procedure to check on you and address any questions or concerns that you may have regarding the information given to you following your  procedure. If we do not reach you, we will leave a message.  We will attempt to reach you two times.  During this call, we will ask if you have developed any symptoms of COVID 19. If you develop any symptoms (ie: fever, flu-like symptoms, shortness of breath, cough etc.) before then, please call [phone].  If you test positive for Covid 19 in the 2 weeks post procedure, please call and report this information to [place].    If any biopsies were taken you will be contacted by phone or by letter within the next 1-3 weeks.  Please call us at [phone] if you have not heard about the biopsies in 3 weeks.    SIGNATURES/CONFIDENTIALITY: You and/or your care partner have signed paperwork which will be entered into your electronic medical record.  These signatures attest to the fact that that the information above on your After Visit Summary has been reviewed and is understood.  Full responsibility of the confidentiality of this discharge information lies with you and/or your care-partner.  Follow dilation diet today  Await pathology  Please read over handouts about gastritis and esophageal strictures  Dr. Celesta Aver office will you to set up a follow up office appointment

## 2019-12-28 NOTE — Progress Notes (Signed)
VS by JD. 

## 2019-12-28 NOTE — Op Note (Signed)
[place] Patient Name: [name] Procedure Date: 12/28/2019 3:02 PM MRN: [id number] Endoscopist: Gerrit Heck , MD Age: 64 Referring MD:  Date of Birth: 01-04-1956 Gender: Female Account #: [id number] Procedure:                Upper GI endoscopy Indications:              Dysphagia, Esophageal reflux                           64 yo female with worsening reflux symptoms and                            dysphagia despite dexlansoprazole. Medicines:                Monitored Anesthesia Care Procedure:                Pre-Anesthesia Assessment:                           - Prior to the procedure, a History and Physical                            was performed, and patient medications and                            allergies were reviewed. The patient's tolerance of                            previous anesthesia was also reviewed. The risks                            and benefits of the procedure and the sedation                            options and risks were discussed with the patient.                            All questions were answered, and informed consent                            was obtained. Prior Anticoagulants: The patient has                            taken no previous anticoagulant or antiplatelet                            agents. ASA Grade Assessment: II - A patient with                            mild systemic disease. After reviewing the risks                            and benefits, the patient was deemed in  satisfactory condition to undergo the procedure.                           After obtaining informed consent, the endoscope was                            passed under direct vision. Throughout the                            procedure, the patient's blood pressure, pulse, and                            oxygen saturations were monitored continuously. The                            Endoscope was introduced through the mouth,  and                            advanced to the second part of duodenum. The upper                            GI endoscopy was accomplished without difficulty.                            The patient tolerated the procedure well. Scope In: Scope Out: Findings:                 The examined esophagus was normal. The scope was                            withdrawn. Dilation was performed with a Maloney                            dilator with mild resistance at 82 Fr. The dilation                            site was examined following endoscope reinsertion                            and showed no bleeding, mucosal tear or                            perforation. Estimated blood loss: none.                           The Z-line was regular and was found 40 cm from the                            incisors.                           Localized mild inflammation characterized by                            erythema was found  in the gastric antrum. Biopsies                            were taken with a cold forceps for histology.                            Estimated blood loss was minimal.                           The cardia, gastric fundus and gastric body were                            normal. Additional mucosal biopsies were taken with                            a cold forceps for Helicobacter pylori testing.                            Estimated blood loss was minimal.                           The examined duodenum was normal. Complications:            No immediate complications. Estimated Blood Loss:     Estimated blood loss was minimal. Impression:               - Normal esophagus. Dilated.                           - Z-line regular, 40 cm from the incisors.                           - Gastritis. Biopsied.                           - Normal cardia, gastric fundus and gastric body.                            Biopsied.                           - Normal examined duodenum. Recommendation:            - Patient has a contact number available for                            emergencies. The signs and symptoms of potential                            delayed complications were discussed with the                            patient. Return to normal activities tomorrow.                            Written discharge instructions were provided to the  patient.                           - Resume previous diet.                           - Continue present medications.                           - Await pathology results.                           - Follow-up with Dr. Carlean Purl in the GI clinic at                            appointment to be scheduled. Gerrit Heck, MD 12/28/2019 3:26:13 PM

## 2019-12-28 NOTE — Progress Notes (Signed)
Upon arrival to the RR, pt noted to be wheezing slightly.  O2 sats on room air- 94-96%  Pt easily awakens and denies SOB

## 2019-12-30 ENCOUNTER — Telehealth: Payer: Self-pay

## 2019-12-30 NOTE — Telephone Encounter (Signed)
  Follow up Call-  Call back number 12/28/2019 01/21/2018  Post procedure Call Back phone  # [phone]  Permission to leave phone message Yes Yes  Some recent data might be hidden     Patient questions:  Do you have a fever, pain , or abdominal swelling? No. Pain Score  0 *  Have you tolerated food without any problems? Yes.    Have you been able to return to your normal activities? Yes.    Do you have any questions about your discharge instructions: Diet   No. Medications  No. Follow up visit  No.  Do you have questions or concerns about your Care? No.  Actions: * If pain score is 4 or above: No action needed, pain <4. 1. Have you developed a fever since your procedure? no  2.   Have you had an respiratory symptoms (SOB or cough) since your procedure? no  3.   Have you tested positive for COVID 19 since your procedure no  4.   Have you had any family members/close contacts diagnosed with the COVID 19 since your procedure?  no   If yes to any of these questions please route to Joylene John, RN and Joella Prince, RN

## 2020-01-05 DIAGNOSIS — M79602 Pain in left arm: Secondary | ICD-10-CM | POA: Diagnosis not present

## 2020-01-05 DIAGNOSIS — M5417 Radiculopathy, lumbosacral region: Secondary | ICD-10-CM | POA: Diagnosis not present

## 2020-01-05 DIAGNOSIS — G43909 Migraine, unspecified, not intractable, without status migrainosus: Secondary | ICD-10-CM | POA: Diagnosis not present

## 2020-01-05 DIAGNOSIS — M5412 Radiculopathy, cervical region: Secondary | ICD-10-CM | POA: Diagnosis not present

## 2020-01-05 DIAGNOSIS — G5603 Carpal tunnel syndrome, bilateral upper limbs: Secondary | ICD-10-CM | POA: Diagnosis not present

## 2020-01-10 ENCOUNTER — Encounter: Payer: Self-pay | Admitting: Gastroenterology

## 2020-01-11 ENCOUNTER — Ambulatory Visit (INDEPENDENT_AMBULATORY_CARE_PROVIDER_SITE_OTHER): Payer: Medicare Other

## 2020-01-11 ENCOUNTER — Other Ambulatory Visit: Payer: Self-pay

## 2020-01-11 DIAGNOSIS — R011 Cardiac murmur, unspecified: Secondary | ICD-10-CM | POA: Diagnosis not present

## 2020-01-11 DIAGNOSIS — M6281 Muscle weakness (generalized): Secondary | ICD-10-CM | POA: Diagnosis not present

## 2020-01-11 LAB — ECHOCARDIOGRAM COMPLETE
AR max vel: 1.32 cm2
AV Area VTI: 1.69 cm2
AV Area mean vel: 1.49 cm2
AV Mean grad: 12 mmHg
AV Peak grad: 23.1 mmHg
Ao pk vel: 2.4 m/s
Area-P 1/2: 2.87 cm2
Calc EF: 69.1 %
P 1/2 time: 975 msec
S' Lateral: 2.41 cm
Single Plane A2C EF: 70.2 %
Single Plane A4C EF: 68.3 %

## 2020-01-17 ENCOUNTER — Telehealth: Payer: Self-pay | Admitting: Cardiology

## 2020-01-17 NOTE — Telephone Encounter (Signed)
Patient called requesting recent echo results.

## 2020-01-17 NOTE — Telephone Encounter (Signed)
Pt voiced understanding

## 2020-01-17 NOTE — Telephone Encounter (Signed)
Echo shows her aortic valve is just a little bit thicker/stiffer than normal but overall is working fine, this is what causes her heart murmur, and is just something to be monitored at this time   Zandra Abts MD

## 2020-01-24 ENCOUNTER — Other Ambulatory Visit: Payer: Self-pay | Admitting: Family Medicine

## 2020-02-01 ENCOUNTER — Encounter (HOSPITAL_COMMUNITY): Payer: Self-pay | Admitting: Psychiatry

## 2020-02-01 ENCOUNTER — Telehealth (INDEPENDENT_AMBULATORY_CARE_PROVIDER_SITE_OTHER): Payer: Medicare Other | Admitting: Psychiatry

## 2020-02-01 ENCOUNTER — Other Ambulatory Visit: Payer: Self-pay

## 2020-02-01 DIAGNOSIS — F332 Major depressive disorder, recurrent severe without psychotic features: Secondary | ICD-10-CM

## 2020-02-01 DIAGNOSIS — F411 Generalized anxiety disorder: Secondary | ICD-10-CM

## 2020-02-01 MED ORDER — DULOXETINE HCL 60 MG PO CPEP: 60.0000 mg | ORAL_CAPSULE | Freq: Two times a day (BID) | ORAL | 2 refills | Status: DC

## 2020-02-01 MED ORDER — CLONAZEPAM 1 MG PO TABS: 1.0000 mg | ORAL_TABLET | Freq: Three times a day (TID) | ORAL | 2 refills | Status: DC | PRN
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
## 2020-02-07 ENCOUNTER — Other Ambulatory Visit: Payer: Self-pay | Admitting: Family Medicine

## 2020-02-07 DIAGNOSIS — I10 Essential (primary) hypertension: Secondary | ICD-10-CM

## 2020-02-07 DIAGNOSIS — E782 Mixed hyperlipidemia: Secondary | ICD-10-CM

## 2020-02-10 DIAGNOSIS — M6281 Muscle weakness (generalized): Secondary | ICD-10-CM | POA: Diagnosis not present

## 2020-02-13 ENCOUNTER — Ambulatory Visit (INDEPENDENT_AMBULATORY_CARE_PROVIDER_SITE_OTHER): Payer: Medicare Other | Admitting: Dermatology

## 2020-02-13 ENCOUNTER — Other Ambulatory Visit: Payer: Self-pay

## 2020-02-13 ENCOUNTER — Encounter: Payer: Self-pay | Admitting: Dermatology

## 2020-02-13 DIAGNOSIS — L820 Inflamed seborrheic keratosis: Secondary | ICD-10-CM | POA: Diagnosis not present

## 2020-02-13 DIAGNOSIS — D0439 Carcinoma in situ of skin of other parts of face: Secondary | ICD-10-CM | POA: Diagnosis not present

## 2020-02-13 DIAGNOSIS — C4441 Basal cell carcinoma of skin of scalp and neck: Secondary | ICD-10-CM | POA: Diagnosis not present

## 2020-02-13 DIAGNOSIS — Z85828 Personal history of other malignant neoplasm of skin: Secondary | ICD-10-CM | POA: Diagnosis not present

## 2020-02-13 DIAGNOSIS — Z8589 Personal history of malignant neoplasm of other organs and systems: Secondary | ICD-10-CM

## 2020-02-13 DIAGNOSIS — L570 Actinic keratosis: Secondary | ICD-10-CM | POA: Diagnosis not present

## 2020-02-13 DIAGNOSIS — Z86018 Personal history of other benign neoplasm: Secondary | ICD-10-CM | POA: Diagnosis not present

## 2020-02-13 DIAGNOSIS — D485 Neoplasm of uncertain behavior of skin: Secondary | ICD-10-CM | POA: Diagnosis not present

## 2020-02-13 NOTE — Patient Instructions (Signed)

## 2020-02-16 DIAGNOSIS — M545 Low back pain, unspecified: Secondary | ICD-10-CM | POA: Diagnosis not present

## 2020-02-16 DIAGNOSIS — G5603 Carpal tunnel syndrome, bilateral upper limbs: Secondary | ICD-10-CM | POA: Diagnosis not present

## 2020-02-16 DIAGNOSIS — G43909 Migraine, unspecified, not intractable, without status migrainosus: Secondary | ICD-10-CM | POA: Diagnosis not present

## 2020-02-16 DIAGNOSIS — M542 Cervicalgia: Secondary | ICD-10-CM | POA: Diagnosis not present

## 2020-02-17 ENCOUNTER — Telehealth: Payer: Self-pay | Admitting: *Deleted

## 2020-02-17 ENCOUNTER — Encounter: Payer: Self-pay | Admitting: Dermatology

## 2020-02-17 NOTE — Progress Notes (Signed)
Follow-Up Visit   Subjective  [name] is a 64 y.o. female who presents for the following: Follow-up (Patient here today for follow up from previous treatment of [place] on right anterior neck per patient area healed well. Per patient she has a spot on the right side of her nose, left inner cheek and right upper lip x several months no bleeding some crust. Patient would also like the spots on her right and left temple and on her scalp x years, per patient the spots are getting larger no bleeding.).  Multiple skin issues including follow-up for old skin cancers and new head neck growths. Location:  Duration:  Quality:  Associated Signs/Symptoms: Modifying Factors:  Severity:  Timing: Context:   Objective  Well appearing patient in no apparent distress; mood and affect are within normal limits. Objective  Left Upper Back: No recurrence, no new atypical moles.  Objective  Left Upper Arm - Anterior: Multiple locations; no recurrence  Objective  Right Breast: Multiple locations; no recurrence  Objective  Right Malar Cheek: 5 mm flat horny crust  Objective  Right Melolabial Fold: Ill-defined waxy 1.1 cm nodule     Objective  Right Parotid Area: Focally eroded pearly 4 mm papule     Objective  Right Anterior Neck: Pearly 5 mm papule      All skin waist up examined.   Assessment & Plan    History of atypical skin mole Left Upper Back  Self examined twice annually, dermatologist yearly.  History of squamous cell carcinoma Left Upper Arm - Anterior  Recheck as needed clinical change  History of basal cell carcinoma (BCC) Right Breast  Annual skin examination  AK (actinic keratosis) Right Malar Cheek  Destruction of lesion - Right Malar Cheek Complexity: simple   Destruction method: cryotherapy   Informed consent: discussed and consent obtained   Timeout:  patient name, date of birth, surgical site, and procedure verified Lesion destroyed  using liquid nitrogen: Yes   Cryotherapy cycles:  3 Outcome: patient tolerated procedure well with no complications    Neoplasm of uncertain behavior of skin (3) Right Melolabial Fold  Skin / nail biopsy Type of biopsy: tangential   Informed consent: discussed and consent obtained   Timeout: patient name, date of birth, surgical site, and procedure verified   Procedure prep:  Patient was prepped and draped in usual sterile fashion (Non sterile) Prep type:  Chlorhexidine Anesthesia: the lesion was anesthetized in a standard fashion   Anesthetic:  1% lidocaine w/ epinephrine 1-100,000 local infiltration Instrument used: flexible razor blade   Outcome: patient tolerated procedure well   Post-procedure details: wound care instructions given    Specimen 1 - Surgical pathology Differential Diagnosis: SCC vs BCC  Check Margins: No  Right Parotid Area  Skin / nail biopsy Type of biopsy: tangential   Informed consent: discussed and consent obtained   Timeout: patient name, date of birth, surgical site, and procedure verified   Procedure prep:  Patient was prepped and draped in usual sterile fashion (Non sterile) Prep type:  Chlorhexidine Anesthesia: the lesion was anesthetized in a standard fashion   Anesthetic:  1% lidocaine w/ epinephrine 1-100,000 local infiltration Instrument used: flexible razor blade   Outcome: patient tolerated procedure well   Post-procedure details: wound care instructions given    Specimen 2 - Surgical pathology Differential Diagnosis: scc vs bcc  Check Margins: No  Right Anterior Neck  Skin / nail biopsy Type of biopsy: tangential   Informed  consent: discussed and consent obtained   Timeout: patient name, date of birth, surgical site, and procedure verified   Procedure prep:  Patient was prepped and draped in usual sterile fashion (Non sterile) Prep type:  Chlorhexidine Anesthesia: the lesion was anesthetized in a standard fashion   Anesthetic:   1% lidocaine w/ epinephrine 1-100,000 local infiltration Instrument used: flexible razor blade   Outcome: patient tolerated procedure well   Post-procedure details: wound care instructions given    Specimen 3 - Surgical pathology Differential Diagnosis: BCC  Check Margins: No [id number] - recurrent lesions.      I, [name], MD, have reviewed all documentation for this visit.  The documentation on 02/17/20 for the exam, diagnosis, procedures, and orders are all accurate and complete.

## 2020-02-17 NOTE — Telephone Encounter (Signed)
-----   Message from [name], MD sent at 02/17/2020  9:20 AM EST ----- Schedule surgery with Dr. Darene Lamer

## 2020-02-17 NOTE — Telephone Encounter (Signed)
Path to patient made a surgery appointment with Dr.Tafeen to treat both spots.

## 2020-02-22 ENCOUNTER — Ambulatory Visit: Payer: Medicare Other | Admitting: Family Medicine

## 2020-02-28 ENCOUNTER — Encounter: Payer: Self-pay | Admitting: Family Medicine

## 2020-02-28 ENCOUNTER — Ambulatory Visit (INDEPENDENT_AMBULATORY_CARE_PROVIDER_SITE_OTHER): Payer: Medicare Other | Admitting: Family Medicine

## 2020-02-28 ENCOUNTER — Other Ambulatory Visit: Payer: Self-pay

## 2020-02-28 VITALS — BP 128/73 | HR 87 | Temp 98.1°F | Ht 65.0 in | Wt 153.2 lb

## 2020-02-28 DIAGNOSIS — I10 Essential (primary) hypertension: Secondary | ICD-10-CM | POA: Diagnosis not present

## 2020-02-28 DIAGNOSIS — J449 Chronic obstructive pulmonary disease, unspecified: Secondary | ICD-10-CM | POA: Diagnosis not present

## 2020-02-28 DIAGNOSIS — M1990 Unspecified osteoarthritis, unspecified site: Secondary | ICD-10-CM | POA: Diagnosis not present

## 2020-02-28 DIAGNOSIS — E559 Vitamin D deficiency, unspecified: Secondary | ICD-10-CM | POA: Diagnosis not present

## 2020-02-28 DIAGNOSIS — J439 Emphysema, unspecified: Secondary | ICD-10-CM

## 2020-02-28 DIAGNOSIS — E782 Mixed hyperlipidemia: Secondary | ICD-10-CM

## 2020-02-28 DIAGNOSIS — F5101 Primary insomnia: Secondary | ICD-10-CM | POA: Diagnosis not present

## 2020-02-28 MED ORDER — CLONIDINE HCL 0.1 MG PO TABS: 0.1000 mg | ORAL_TABLET | Freq: Every day | ORAL | 1 refills | Status: DC

## 2020-02-28 MED ORDER — FEXOFENADINE HCL 180 MG PO TABS: 180.0000 mg | ORAL_TABLET | Freq: Every day | ORAL | 11 refills | Status: DC

## 2020-02-28 MED ORDER — ATORVASTATIN CALCIUM 40 MG PO TABS: 40.0000 mg | ORAL_TABLET | Freq: Every day | ORAL | 1 refills | Status: DC

## 2020-02-28 MED ORDER — LISINOPRIL 20 MG PO TABS: 20.0000 mg | ORAL_TABLET | Freq: Every day | ORAL | 1 refills | Status: DC

## 2020-02-28 MED ORDER — DEXILANT 60 MG PO CPDR: DELAYED_RELEASE_CAPSULE | ORAL | 1 refills | Status: DC

## 2020-02-28 MED ORDER — SUCRALFATE 1 G PO TABS: 1.0000 g | ORAL_TABLET | Freq: Two times a day (BID) | ORAL | 2 refills | Status: DC

## 2020-02-28 MED ORDER — PREDNISONE 10 MG PO TABS: ORAL_TABLET | ORAL | 0 refills | Status: DC

## 2020-02-28 MED ORDER — IPRATROPIUM-ALBUTEROL 0.5-2.5 (3) MG/3ML IN SOLN: 3.0000 mL | Freq: Four times a day (QID) | RESPIRATORY_TRACT | 5 refills | Status: AC | PRN

## 2020-02-28 MED ORDER — SPIRIVA RESPIMAT 2.5 MCG/ACT IN AERS: INHALATION_SPRAY | RESPIRATORY_TRACT | 5 refills | Status: DC

## 2020-02-28 MED ORDER — BUDESONIDE-FORMOTEROL FUMARATE 160-4.5 MCG/ACT IN AERO
INHALATION_SPRAY | RESPIRATORY_TRACT | 5 refills | Status: DC
Start: 2020-02-28 — End: 2020-08-16

## 2020-02-28 NOTE — Progress Notes (Signed)
Subjective:  Patient ID: [name], female    DOB: 1955/03/26  Age: 64 y.o. MRN: [id number]  CC: Medical Management of Chronic Issues   HPI [name] presents for following up on multiple issues.  Currently her blood pressure and cholesterol are doing well.  Follow-up of hypertension. Patient has no history of headache chest pain or shortness of breath or recent cough. Patient also denies symptoms of TIA such as numbness weakness lateralizing. Patient checks  blood pressure at home and has not had any elevated readings recently. Patient denies side effects from his medication. States taking it regularly. Patient in for follow-up of elevated cholesterol. Doing well without complaints on current medication. Denies side effects of statin including myalgia and arthralgia and nausea. Also in today for liver function testing. Currently no chest pain, shortness of breath or other cardiovascular related symptoms noted.  Patient says that her COPD is getting worse.  She short of breath and wheezing more at night she wakes up during the night short of breath.  She is not having chest pain or swelling in the feet and ankles.  She recently saw her heart doctor and diagnosed her with a murmur.  The murmur was checked out with an echo which showed no serious valvular issues apparently.  Review of the report shows that she has an atrial valve gradient pressure max of 23.  Her left ventricular ejection fraction is 60-65 with grade 1 diastolic dysfunction.  She is using multiple inhalers and spite of this she is still having active symptoms.  She has a pulmonologist as well.  She feels the allergies are having an impact on this as well.  She is getting congested when she goes outdoors.  She used to take an allergy medicine and has not had that recently.  She also says she has a CT of her chest pending for the 11th of this month.  She is not sure who ordered back.  She also has active reflux.  The reflux is  getting worse.  She had a scope recently with her GI doctor and she read the report on my chart that said there was something down in there with inflammation.  Of note is that she had on her report reactive inflammation.  Her H. pylori test was negative and there was no sign of dysplasia.   Depression screen [facility] 2/9 02/28/2020 08/22/2019 07/15/2018  Decreased Interest 2 0 0  Down, Depressed, Hopeless 2 1 0  PHQ - 2 Score 4 1 0  Altered sleeping 1 - -  Tired, decreased energy 2 - -  Change in appetite 1 - -  Feeling bad or failure about yourself  0 - -  Trouble concentrating 2 - -  Moving slowly or fidgety/restless 0 - -  Suicidal thoughts 0 - -  PHQ-9 Score 10 - -  Difficult doing work/chores Somewhat difficult - -  Some recent data might be hidden    History [name] has a past medical history of Allergy, Arthritis, Atypical mole (06/05/2003), Atypical nevus (07/26/2003), Basal cell carcinoma (06/05/2003), Basal cell carcinoma (05/22/2004), Basal cell carcinoma (11/27/2004), Basal cell carcinoma (11/08/2009), Basal cell carcinoma (05/24/2012), Basal cell carcinoma (06/24/2012), Basal cell carcinoma (07/29/2012), Basal cell carcinoma (06/06/2019), Cataract, Constipation, COPD (chronic obstructive pulmonary disease) ([place]), Coronary artery disease, COVID, Depression, GERD (gastroesophageal reflux disease), Heart murmur, adenomatous polyp of colon (10/03/2014), Hypercholesteremia, Hypertension, Panic attacks, Perimenopausal, Skin cancer, Squamous cell carcinoma of skin (06/05/2003), Squamous cell carcinoma of skin (05/22/2004),  Squamous cell carcinoma of skin (11/27/2004), Squamous cell carcinoma of skin (03/04/2006), Squamous cell carcinoma of skin (11/26/2006), Squamous cell carcinoma of skin (05/24/2012), Squamous cell carcinoma of skin (04/05/2013), Squamous cell carcinoma of skin (08/17/2013), Squamous cell carcinoma of skin (07/30/2016), Squamous cell carcinoma of skin (05/12/2017), Squamous cell  carcinoma of skin (11/23/2018), and Tobacco user.   She has a past surgical history that includes Abdominal hysterectomy; vocal cord; Right foot toe surgery; Colonoscopy; and Upper gastrointestinal endoscopy.   Her family history includes Alcohol abuse in her brother; Anxiety disorder in her brother and sister; Colon cancer (age of onset: 58) in her brother; Colon polyps in her brother, brother, sister, and sister; Depression in her brother and sister; Drug abuse in her son; Esophageal cancer in her sister; Heart attack in her brother, father, and mother; Heart disease in her mother.She reports that she has been smoking cigarettes. She started smoking about 50 years ago. She has a 20.00 pack-year smoking history. She has never used smokeless tobacco. She reports that she does not drink alcohol and does not use drugs.    ROS Review of Systems  Constitutional: Negative.   HENT: Negative.   Eyes: Negative for visual disturbance.  Respiratory: Negative for shortness of breath.   Cardiovascular: Negative for chest pain.  Gastrointestinal: Negative for abdominal pain.  Musculoskeletal: Negative for arthralgias.    Objective:  BP 128/73   Pulse 87   Temp 98.1 F (36.7 C) (Temporal)   Ht 5\' 5"  (1.651 m)   Wt 153 lb 4 oz (69.5 kg)   SpO2 99%   BMI 25.50 kg/m   BP Readings from Last 3 Encounters:  02/28/20 128/73  12/28/19 112/69  12/15/19 (!) 142/98    Wt Readings from Last 3 Encounters:  02/28/20 153 lb 4 oz (69.5 kg)  12/28/19 155 lb (70.3 kg)  12/15/19 155 lb (70.3 kg)     Physical Exam Constitutional:      General: She is not in acute distress.    Appearance: She is well-developed. She is ill-appearing.  HENT:     Head: Normocephalic and atraumatic.  Eyes:     Conjunctiva/sclera: Conjunctivae normal.     Pupils: Pupils are equal, round, and reactive to light.  Neck:     Thyroid: No thyromegaly.  Cardiovascular:     Rate and Rhythm: Normal rate and regular rhythm.      Heart sounds: Normal heart sounds. No murmur heard.   Pulmonary:     Effort: Pulmonary effort is normal. No respiratory distress.     Breath sounds: Normal breath sounds. No wheezing or rales.  Abdominal:     General: Bowel sounds are normal. There is no distension.     Palpations: Abdomen is soft.     Tenderness: There is no abdominal tenderness.  Musculoskeletal:        General: Normal range of motion.     Cervical back: Normal range of motion and neck supple.  Lymphadenopathy:     Cervical: No cervical adenopathy.  Skin:    General: Skin is warm and dry.  Neurological:     Mental Status: She is alert and oriented to person, place, and time.  Psychiatric:        Behavior: Behavior normal.        Thought Content: Thought content normal.        Judgment: Judgment normal.       Assessment & Plan:   Taviana was seen today for medical management of  chronic issues.  Diagnoses and all orders for this visit:  Essential hypertension -     CBC with Differential/Platelet -     CMP14+EGFR -     Lipid panel -     lisinopril (ZESTRIL) 20 MG tablet; Take 1 tablet (20 mg total) by mouth daily.  Osteoarthritis, unspecified osteoarthritis type, unspecified site -     CBC with Differential/Platelet -     CMP14+EGFR -     Lipid panel  Primary insomnia -     CBC with Differential/Platelet -     CMP14+EGFR -     Lipid panel  Chronic obstructive pulmonary disease, unspecified COPD type (HCC) -     CBC with Differential/Platelet -     CMP14+EGFR -     Lipid panel  Mixed hyperlipidemia -     CBC with Differential/Platelet -     CMP14+EGFR -     Lipid panel -     atorvastatin (LIPITOR) 40 MG tablet; Take 1 tablet (40 mg total) by mouth at bedtime.  Vitamin D deficiency -     VITAMIN D 25 Hydroxy (Vit-D Deficiency, Fractures)  Pulmonary emphysema, unspecified emphysema type (HCC) -     budesonide-formoterol (SYMBICORT) 160-4.5 MCG/ACT inhaler; INHALE TWO PUFFS INTO THE LUNGS BY  MOUTH TWICE DAILY  Other orders -     sucralfate (CARAFATE) 1 g tablet; Take 1 tablet (1 g total) by mouth 2 (two) times daily. -     fexofenadine (ALLEGRA) 180 MG tablet; Take 1 tablet (180 mg total) by mouth daily. For allergy symptoms -     predniSONE (DELTASONE) 10 MG tablet; Take 5 daily for 3 days followed by 4,3,2 and 1 for 3 days each. -     cloNIDine (CATAPRES) 0.1 MG tablet; Take 1 tablet (0.1 mg total) by mouth at bedtime. -     dexlansoprazole (DEXILANT) 60 MG capsule; TAKE ONE CAPSULE BY MOUTH DAILY ON EMPTY STOMACH, THEN DO NOT EAT FOR ONE HOUR. (REPLACES PANTROPRAZOLE FOR REFLUX) -     ipratropium-albuterol (DUONEB) 0.5-2.5 (3) MG/3ML SOLN; Take 3 mLs by nebulization every 6 (six) hours as needed. -     Tiotropium Bromide Monohydrate (SPIRIVA RESPIMAT) 2.5 MCG/ACT AERS; INHALE TWO PUFFS INTO THE LUNGS DAILY       I have discontinued Sandy M. Demeter's pantoprazole. I have also changed her atorvastatin, cloNIDine, Dexilant, lisinopril, and Spiriva Respimat. Additionally, I am having her start on sucralfate, fexofenadine, and predniSONE. Lastly, I am having her maintain her aspirin EC, fish oil-omega-3 fatty acids, vitamin C, gabapentin, albuterol, Oxycodone HCl, meloxicam, gabapentin, amLODipine, clonazePAM, DULoxetine, budesonide-formoterol, and ipratropium-albuterol.  Allergies as of 02/28/2020      Reactions   Lorcet 10-650 [hydrocodone-acetaminophen] Itching   Sulfa Antibiotics Other (See Comments)   aching   Tape Other (See Comments)   Please use paper tape      Medication List       Accurate as of February 28, 2020 11:59 PM. If you have any questions, ask your nurse or doctor.        STOP taking these medications   pantoprazole 40 MG tablet Commonly known as: PROTONIX Stopped by: Claretta Fraise, MD     TAKE these medications   albuterol 108 (90 Base) MCG/ACT inhaler Commonly known as: VENTOLIN HFA Inhale 2 puffs into the lungs every 4 (four) hours as  needed for wheezing or shortness of breath.   amLODipine 10 MG tablet Commonly known as: NORVASC Take 1 tablet (10 mg  total) by mouth daily. What changed: Another medication with the same name was removed. Continue taking this medication, and follow the directions you see here. Changed by: Claretta Fraise, MD   aspirin EC 81 MG tablet Take 81 mg by mouth daily.   atorvastatin 40 MG tablet Commonly known as: LIPITOR Take 1 tablet (40 mg total) by mouth at bedtime.   budesonide-formoterol 160-4.5 MCG/ACT inhaler Commonly known as: Symbicort INHALE TWO PUFFS INTO THE LUNGS BY MOUTH TWICE DAILY   clonazePAM 1 MG tablet Commonly known as: KLONOPIN Take 1 tablet (1 mg total) by mouth 3 (three) times daily as needed. for anxiety   cloNIDine 0.1 MG tablet Commonly known as: CATAPRES Take 1 tablet (0.1 mg total) by mouth at bedtime.   Dexilant 60 MG capsule Generic drug: dexlansoprazole TAKE ONE CAPSULE BY MOUTH DAILY ON EMPTY STOMACH, THEN DO NOT EAT FOR ONE HOUR. (REPLACES PANTROPRAZOLE FOR REFLUX)   DULoxetine 60 MG capsule Commonly known as: CYMBALTA Take 1 capsule (60 mg total) by mouth 2 (two) times daily.   fexofenadine 180 MG tablet Commonly known as: ALLEGRA Take 1 tablet (180 mg total) by mouth daily. For allergy symptoms Started by: Claretta Fraise, MD   fish oil-omega-3 fatty acids 1000 MG capsule Take 1 g by mouth daily.   gabapentin 300 MG capsule Commonly known as: NEURONTIN Take 300-600 mg by mouth See admin instructions. Take 1 capsule in the morning and 2 capsule at bedtime   gabapentin 600 MG tablet Commonly known as: NEURONTIN Take 600 mg by mouth 3 (three) times daily.   ipratropium-albuterol 0.5-2.5 (3) MG/3ML Soln Commonly known as: DUONEB Take 3 mLs by nebulization every 6 (six) hours as needed.   lisinopril 20 MG tablet Commonly known as: ZESTRIL Take 1 tablet (20 mg total) by mouth daily.   meloxicam 15 MG tablet Commonly known as: MOBIC Take  15 mg by mouth daily.   Oxycodone HCl 10 MG Tabs Take 10 mg by mouth every 4 (four) hours as needed.   predniSONE 10 MG tablet Commonly known as: DELTASONE Take 5 daily for 3 days followed by 4,3,2 and 1 for 3 days each. Started by: Claretta Fraise, MD   Spiriva Respimat 2.5 MCG/ACT Aers Generic drug: Tiotropium Bromide Monohydrate INHALE TWO PUFFS INTO THE LUNGS DAILY   sucralfate 1 g tablet Commonly known as: Carafate Take 1 tablet (1 g total) by mouth 2 (two) times daily. Started by: Claretta Fraise, MD   vitamin C 1000 MG tablet Take 1,000 mg by mouth daily.        Follow-up: Return in about 6 months (around 08/28/2020), or if symptoms worsen or fail to improve.  Claretta Fraise, M.D.

## 2020-02-29 ENCOUNTER — Encounter: Payer: Self-pay | Admitting: Family Medicine

## 2020-02-29 LAB — CBC WITH DIFFERENTIAL/PLATELET
Basophils Absolute: 0 10*3/uL (ref 0.0–0.2)
Basos: 0 %
EOS (ABSOLUTE): 0.1 10*3/uL (ref 0.0–0.4)
Eos: 1 %
Hematocrit: 43 % (ref 34.0–46.6)
Hemoglobin: 14.1 g/dL (ref 11.1–15.9)
Immature Grans (Abs): 0.2 10*3/uL — ABNORMAL HIGH (ref 0.0–0.1)
Immature Granulocytes: 2 %
Lymphocytes Absolute: 1.9 10*3/uL (ref 0.7–3.1)
Lymphs: 14 %
MCH: 30.7 pg (ref 26.6–33.0)
MCHC: 32.8 g/dL (ref 31.5–35.7)
MCV: 94 fL (ref 79–97)
Monocytes Absolute: 0.9 10*3/uL (ref 0.1–0.9)
Monocytes: 7 %
Neutrophils Absolute: 9.9 10*3/uL — ABNORMAL HIGH (ref 1.4–7.0)
Neutrophils: 76 %
Platelets: 372 10*3/uL (ref 150–450)
RBC: 4.6 x10E6/uL (ref 3.77–5.28)
RDW: 13.5 % (ref 11.7–15.4)
WBC: 13 10*3/uL — ABNORMAL HIGH (ref 3.4–10.8)

## 2020-02-29 LAB — LIPID PANEL
Chol/HDL Ratio: 3.5 ratio (ref 0.0–4.4)
Cholesterol, Total: 183 mg/dL (ref 100–199)
HDL: 53 mg/dL (ref >39)
LDL Chol Calc (NIH): 109 mg/dL — ABNORMAL HIGH (ref 0–99)
Triglycerides: 116 mg/dL (ref 0–149)
VLDL Cholesterol Cal: 21 mg/dL (ref 5–40)

## 2020-02-29 LAB — CMP14+EGFR
ALT: 11 IU/L (ref 0–32)
AST: 8 IU/L (ref 0–40)
Albumin/Globulin Ratio: 1.7 (ref 1.2–2.2)
Albumin: 4 g/dL (ref 3.8–4.8)
Alkaline Phosphatase: 71 IU/L (ref 44–121)
BUN/Creatinine Ratio: 25 (ref 12–28)
BUN: 18 mg/dL (ref 8–27)
Bilirubin Total: <0.2 mg/dL (ref 0.0–1.2)
CO2: 25 mmol/L (ref 20–29)
Calcium: 9.9 mg/dL (ref 8.7–10.3)
Chloride: 103 mmol/L (ref 96–106)
Creatinine, Ser: 0.73 mg/dL (ref 0.57–1.00)
GFR calc Af Amer: 99 mL/min/{1.73_m2} (ref >59)
GFR calc non Af Amer: 86 mL/min/{1.73_m2} (ref >59)
Globulin, Total: 2.4 g/dL (ref 1.5–4.5)
Glucose: 81 mg/dL (ref 65–99)
Potassium: 4.2 mmol/L (ref 3.5–5.2)
Sodium: 140 mmol/L (ref 134–144)
Total Protein: 6.4 g/dL (ref 6.0–8.5)

## 2020-02-29 LAB — VITAMIN D 25 HYDROXY (VIT D DEFICIENCY, FRACTURES): Vit D, 25-Hydroxy: 42.6 ng/mL (ref 30.0–100.0)

## 2020-03-01 ENCOUNTER — Encounter: Payer: Self-pay | Admitting: Family Medicine

## 2020-03-01 NOTE — Progress Notes (Signed)
Hello Kerrilynn,  Your lab result is normal and/or stable.Some minor variations that are not significant are commonly marked abnormal, but do not represent any medical problem for you.  Best regards, Kamalei Roeder, M.D.

## 2020-03-09 DIAGNOSIS — M5116 Intervertebral disc disorders with radiculopathy, lumbar region: Secondary | ICD-10-CM | POA: Diagnosis not present

## 2020-03-09 DIAGNOSIS — M4726 Other spondylosis with radiculopathy, lumbar region: Secondary | ICD-10-CM | POA: Diagnosis not present

## 2020-03-09 DIAGNOSIS — M4802 Spinal stenosis, cervical region: Secondary | ICD-10-CM | POA: Diagnosis not present

## 2020-03-09 DIAGNOSIS — M9973 Connective tissue and disc stenosis of intervertebral foramina of lumbar region: Secondary | ICD-10-CM | POA: Diagnosis not present

## 2020-03-09 DIAGNOSIS — M5011 Cervical disc disorder with radiculopathy,  high cervical region: Secondary | ICD-10-CM | POA: Diagnosis not present

## 2020-03-09 DIAGNOSIS — M9971 Connective tissue and disc stenosis of intervertebral foramina of cervical region: Secondary | ICD-10-CM | POA: Diagnosis not present

## 2020-03-11 DIAGNOSIS — M6281 Muscle weakness (generalized): Secondary | ICD-10-CM | POA: Diagnosis not present

## 2020-03-13 ENCOUNTER — Ambulatory Visit (HOSPITAL_COMMUNITY): Payer: Medicare Other

## 2020-03-16 ENCOUNTER — Ambulatory Visit: Payer: Medicare Other

## 2020-03-21 ENCOUNTER — Other Ambulatory Visit: Payer: Self-pay | Admitting: Family Medicine

## 2020-03-21 DIAGNOSIS — M6281 Muscle weakness (generalized): Secondary | ICD-10-CM | POA: Diagnosis not present

## 2020-03-21 DIAGNOSIS — I10 Essential (primary) hypertension: Secondary | ICD-10-CM

## 2020-03-21 DIAGNOSIS — E782 Mixed hyperlipidemia: Secondary | ICD-10-CM

## 2020-03-22 ENCOUNTER — Other Ambulatory Visit: Payer: Self-pay

## 2020-03-22 ENCOUNTER — Ambulatory Visit (HOSPITAL_COMMUNITY)
Admission: RE | Admit: 2020-03-22 | Discharge: 2020-03-22 | Disposition: A | Discharge status: Home / Self Care | Payer: Medicare Other | Source: Ambulatory Visit | Attending: Adult Health | Admitting: Adult Health

## 2020-03-22 DIAGNOSIS — I700 Atherosclerosis of aorta: Secondary | ICD-10-CM | POA: Insufficient documentation

## 2020-03-22 DIAGNOSIS — F17200 Nicotine dependence, unspecified, uncomplicated: Secondary | ICD-10-CM

## 2020-03-22 DIAGNOSIS — J439 Emphysema, unspecified: Secondary | ICD-10-CM | POA: Diagnosis not present

## 2020-03-22 DIAGNOSIS — F17210 Nicotine dependence, cigarettes, uncomplicated: Secondary | ICD-10-CM | POA: Diagnosis not present

## 2020-03-22 DIAGNOSIS — Z122 Encounter for screening for malignant neoplasm of respiratory organs: Secondary | ICD-10-CM | POA: Diagnosis not present

## 2020-03-22 DIAGNOSIS — I2510 Atherosclerotic heart disease of native coronary artery without angina pectoris: Secondary | ICD-10-CM | POA: Insufficient documentation

## 2020-03-26 ENCOUNTER — Telehealth: Payer: Self-pay | Admitting: Pulmonary Disease

## 2020-03-26 DIAGNOSIS — F17200 Nicotine dependence, unspecified, uncomplicated: Secondary | ICD-10-CM

## 2020-03-27 NOTE — Telephone Encounter (Signed)
Called and spoke to pt. Informed her of the results and recs per TP. Order placed for lung screening program. CT sent to PCP. Appt scheduled with Dr. Vaughan Browner for 3.9.22. Pt verbalized understanding and denied any further questions or concerns at this time.    Right lower lobe pulmonary nodule is similar to slightly decreased in size This is considered lung RADS 2 a benign/noncancerous appearance. Plan for low-dose CT chest in 1 year-please enroll in the low-dose CT screening program with Eric Form NP   Incidental findings are coronary artery disease atherosclerosis.- Possible renal pole lipoma You will need to discuss this with your primary provider to see if further evaluation is indicated  Tried to call patient , no answer

## 2020-04-02 ENCOUNTER — Ambulatory Visit (INDEPENDENT_AMBULATORY_CARE_PROVIDER_SITE_OTHER): Payer: Medicare Other | Admitting: *Deleted

## 2020-04-02 DIAGNOSIS — Z0000 Encounter for general adult medical examination without abnormal findings: Secondary | ICD-10-CM

## 2020-04-02 NOTE — Patient Instructions (Signed)
[place] Maintenance Summary and Written Plan of Care  Ms. Heitman ,  Thank you for allowing me to perform your Medicare Annual Wellness Visit and for your ongoing commitment to your health.   Health Maintenance & Immunization History Health Maintenance  Topic Date Due  . DEXA SCAN  Never done  . COVID-19 Vaccine (3 - Moderna risk 4-dose series) 07/15/2019  . PNA vac Low Risk Adult (2 of 2 - PPSV23) 08/21/2020  . MAMMOGRAM  07/26/2021  . COLONOSCOPY (Pts 45-4yrs Insurance coverage will need to be confirmed)  01/22/2023  . TETANUS/TDAP  08/21/2029  . INFLUENZA VACCINE  Completed  . Hepatitis C Screening  Completed   Immunization History  Administered Date(s) Administered  . Fluad Quad(high Dose 65+) 12/10/2018  . Influenza, High Dose Seasonal PF 12/05/2019  . Influenza,inj,Quad PF,6+ Mos 02/11/2013, 05/17/2015, 12/03/2015, 12/26/2016, 12/16/2017  . Influenza-Unspecified 12/15/2018  . Moderna Sars-Covid-2 Vaccination 05/05/2019, 06/17/2019  . Pneumococcal Conjugate-13 08/22/2019  . Tdap 08/22/2019  . Zoster 06/02/2014    These are the patient goals that we discussed: Goals Addressed            This Visit's Progress   . AWV       04/02/2020 AWV Goal: Exercise for General Health   Patient will verbalize understanding of the benefits of increased physical activity:  Exercising regularly is important. It will improve your overall fitness, flexibility, and endurance.  Regular exercise also will improve your overall health. It can help you control your weight, reduce stress, and improve your bone density.  Over the next year, patient will increase physical activity as tolerated with a goal of at least 150 minutes of moderate physical activity per week.   You can tell that you are exercising at a moderate intensity if your heart starts beating faster and you start breathing faster but can still hold a conversation.  Moderate-intensity exercise  ideas include:  Walking 1 mile (1.6 km) in about 15 minutes  Biking  Hiking  Golfing  Dancing  Water aerobics  Patient will verbalize understanding of everyday activities that increase physical activity by providing examples like the following: ? Yard work, such as: ? Pushing a Conservation officer, nature ? Raking and bagging leaves ? Washing your car ? Pushing a stroller ? Shoveling snow ? Gardening ? Washing windows or floors  Patient will be able to explain general safety guidelines for exercising:   Before you start a new exercise program, talk with your health care provider.  Do not exercise so much that you hurt yourself, feel dizzy, or get very short of breath.  Wear comfortable clothes and wear shoes with good support.  Drink plenty of water while you exercise to prevent dehydration or heat stroke.  Work out until your breathing and your heartbeat get faster.         This is a list of Health Maintenance Items that are overdue or due now: Health Maintenance Due  Topic Date Due  . DEXA SCAN  Never done  . COVID-19 Vaccine (3 - Moderna risk 4-dose series) 07/15/2019     Orders/Referrals Placed Today: No orders of the defined types were placed in this encounter.  (Contact our referral department at [phone] if you have not spoken with someone about your referral appointment within the next 5 days)    Follow-up Plan . Follow-up with Claretta Fraise, MD as planned . Schedule COVID Booster once you get to feeling better . We will complete Dexa Scan  at next visit.

## 2020-04-02 NOTE — Progress Notes (Addendum)
MEDICARE ANNUAL WELLNESS VISIT  04/02/2020  Telephone Visit Disclaimer This Medicare AWV was conducted by telephone due to national recommendations for restrictions regarding the COVID-19 Pandemic (e.g. social distancing).  I verified, using two identifiers, that I am speaking with [name] or their authorized healthcare agent. I discussed the limitations, risks, security, and privacy concerns of performing an evaluation and management service by telephone and the potential availability of an in-person appointment in the future. The patient expressed understanding and agreed to proceed.  Location of Patient: Home Location of Provider (nurse):  [facility]  Subjective:    [name] is a 65 y.o. female patient of Stacks, Cletus Gash, MD who had a Medicare Annual Wellness Visit today via telephone. [name] is Retired and lives alone. She has a son and daughter in law that live next door and help her out a lot.  she has 2 children. she reports that she is socially active and does interact with friends/family regularly. she is not physically active and patient states she does not have any hobbies.  Patient Care Team: Claretta Fraise, MD as PCP - General (Family Medicine) Harl Bowie Alphonse Guild, MD as PCP - Cardiology (Cardiology) Gatha Mayer, MD as Consulting Physician (Gastroenterology)  Advanced Directives 04/02/2020 12/28/2019 03/05/2019 02/10/2019 02/18/2017 09/14/2014 07/12/2013  Does Patient Have a Medical Advance Directive? No No No No No No Patient does not have advance directive;Patient would like information  Would patient like information on creating a medical advance directive? No - Patient declined No - Patient declined - - No - Patient declined No - patient declined information Advance directive packet given  Pre-existing out of facility DNR order (yellow form or pink MOST form) - - - - - - No  Some encounter information is confidential and restricted. Go  to Review Flowsheets activity to see all data.    Hospital Utilization Over the Past 12 Months: # of hospitalizations or ER visits: 0 # of surgeries: 0  Review of Systems    Patient reports that her overall health is worse compared to last year. She states that due to her respiratory issues and her back, hip, and neck pain that she feels her health is getting worse.   History obtained from chart review and the patient Musculoskeletal ROS: positive for - pain in back - bilateral, hip - left and leg - left  Patient Reported Readings (BP, Pulse, CBG, Weight, etc) none  Pain Assessment Pain : 0-10 Pain Score: 7  Pain Type: Acute pain Pain Location: Hip (leg pain- left) Pain Orientation: Left Pain Descriptors / Indicators: Constant,Aching Pain Onset: More than a month ago Pain Relieving Factors: resting, oxycodone Effect of Pain on Daily Activities: Causes her trouble walking  Pain Relieving Factors: resting, oxycodone  Current Medications & Allergies (verified) Allergies as of 04/02/2020      Reactions   Lorcet 10-650 [hydrocodone-acetaminophen] Itching   Sulfa Antibiotics Other (See Comments)   aching   Tape Other (See Comments)   Please use paper tape      Medication List       Accurate as of April 02, 2020  1:44 PM. If you have any questions, ask your nurse or doctor.        STOP taking these medications   predniSONE 10 MG tablet Commonly known as: DELTASONE     TAKE these medications   albuterol 108 (90 Base) MCG/ACT inhaler Commonly known as: VENTOLIN HFA Inhale 2 puffs into the  lungs every 4 (four) hours as needed for wheezing or shortness of breath.   amLODipine 10 MG tablet Commonly known as: NORVASC Take 1 tablet (10 mg total) by mouth daily.   amLODipine 5 MG tablet Commonly known as: NORVASC TAKE 1 TABLET BY MOUTH  DAILY   aspirin EC 81 MG tablet Take 81 mg by mouth daily.   atorvastatin 40 MG tablet Commonly known as: LIPITOR TAKE 1 TABLET  BY MOUTH AT  BEDTIME   budesonide-formoterol 160-4.5 MCG/ACT inhaler Commonly known as: Symbicort INHALE TWO PUFFS INTO THE LUNGS BY MOUTH TWICE DAILY   cholecalciferol 25 MCG (1000 UNIT) tablet Commonly known as: VITAMIN D3 Take 1,000 Units by mouth daily.   clonazePAM 1 MG tablet Commonly known as: KLONOPIN Take 1 tablet (1 mg total) by mouth 3 (three) times daily as needed. for anxiety   cloNIDine 0.1 MG tablet Commonly known as: CATAPRES Take 1 tablet (0.1 mg total) by mouth at bedtime.   Dexilant 60 MG capsule Generic drug: dexlansoprazole TAKE ONE CAPSULE BY MOUTH DAILY ON EMPTY STOMACH, THEN DO NOT EAT FOR ONE HOUR. (REPLACES PANTROPRAZOLE FOR REFLUX)   DULoxetine 60 MG capsule Commonly known as: CYMBALTA Take 1 capsule (60 mg total) by mouth 2 (two) times daily.   fexofenadine 180 MG tablet Commonly known as: ALLEGRA Take 1 tablet (180 mg total) by mouth daily. For allergy symptoms   fish oil-omega-3 fatty acids 1000 MG capsule Take 1 g by mouth daily.   gabapentin 300 MG capsule Commonly known as: NEURONTIN Take 300-600 mg by mouth See admin instructions. Take 1 capsule in the morning and 2 capsule at bedtime   gabapentin 600 MG tablet Commonly known as: NEURONTIN Take 600 mg by mouth 3 (three) times daily.   ipratropium-albuterol 0.5-2.5 (3) MG/3ML Soln Commonly known as: DUONEB Take 3 mLs by nebulization every 6 (six) hours as needed.   lisinopril 20 MG tablet Commonly known as: ZESTRIL TAKE 1 TABLET BY MOUTH  DAILY   meloxicam 15 MG tablet Commonly known as: MOBIC Take 15 mg by mouth daily.   oxyCODONE 15 MG immediate release tablet Commonly known as: ROXICODONE Take 15 mg by mouth 4 (four) times daily as needed. What changed: Another medication with the same name was removed. Continue taking this medication, and follow the directions you see here.   pantoprazole 40 MG tablet Commonly known as: PROTONIX TAKE 1 TABLET BY MOUTH  TWICE DAILY    Spiriva Respimat 2.5 MCG/ACT Aers Generic drug: Tiotropium Bromide Monohydrate INHALE TWO PUFFS INTO THE LUNGS DAILY   sucralfate 1 g tablet Commonly known as: Carafate Take 1 tablet (1 g total) by mouth 2 (two) times daily.   vitamin C 1000 MG tablet Take 1,000 mg by mouth daily.       History (reviewed): Past Medical History:  Diagnosis Date  . Allergy   . Arthritis   . Atypical mole 06/05/2003   slight-mod-Left scapula (WS)  . Atypical nevus 07/26/2003   persist. dysp- Left scapula (WS)  . Basal cell carcinoma 06/05/2003   RIght chest (CX35FU)  . Basal cell carcinoma 05/22/2004   superificial- RIght upper back- (CX35FU)  . Basal cell carcinoma 11/27/2004   beside R nostril-(MOHS), below right outer nose (MOHS)  . Basal cell carcinoma 11/08/2009   left post shoulder -(txpbx)  . Basal cell carcinoma 05/24/2012   ulcerated- Left post shoulder- (CX35FU), ulcerated-mid forehead (EXC )  . Basal cell carcinoma 06/24/2012   superificial- Right shoulder - (txpbx)   .  Basal cell carcinoma 07/29/2012    sclerosis- mid forehead (MOHS)  . Basal cell carcinoma 06/06/2019   nod-right anterior neck (CX35FU)  . Cataract   . Constipation    uses OTC meds with help  . COPD (chronic obstructive pulmonary disease) ([place])   . Coronary artery disease    minimal nonobstructive   . COVID   . Depression   . GERD (gastroesophageal reflux disease)   . Heart murmur   . Hx of adenomatous polyp of colon 10/03/2014  . Hypercholesteremia   . Hypertension   . Panic attacks   . Perimenopausal   . Skin cancer   . Squamous cell carcinoma of skin 06/05/2003   Right [place](CX35FU)  . Squamous cell carcinoma of skin 05/22/2004   in situ- Left upperarm (CX35FU)  . Squamous cell carcinoma of skin 11/27/2004   in situ- above Left elbow (CX35FU)  . Squamous cell carcinoma of skin 03/04/2006   in situ- Left sideburn (CX35FU)  . Squamous cell carcinoma of skin 11/26/2006   in situ- mid brow  (Cx35FU)  . Squamous cell carcinoma of skin 05/24/2012   in situ- Left chest- (CX35FU)  . Squamous cell carcinoma of skin 04/05/2013   in situ- Left nose (Cx35FU)  . Squamous cell carcinoma of skin 08/17/2013   well diff-above Left eye (txpbx)  . Squamous cell carcinoma of skin 07/30/2016   in situ- right chest sup (Cx35FU)  . Squamous cell carcinoma of skin 05/12/2017   in situ- RIght shoulder (CX35FU), in situ- Left forehead (CX35FU)  . Squamous cell carcinoma of skin 11/23/2018   in situ- Right mid shin, inner- (MOHS)  . Tobacco user    Past Surgical History:  Procedure Laterality Date  . ABDOMINAL HYSTERECTOMY    . COLONOSCOPY    . Right foot toe surgery    . UPPER GASTROINTESTINAL ENDOSCOPY    . vocal cord     polyp removal   Family History  Problem Relation Age of Onset  . Heart disease Mother   . Heart attack Mother   . Heart attack Father   . Heart attack Brother   . Colon cancer Brother 23       died at age 70  . Anxiety disorder Brother   . Depression Brother   . Alcohol abuse Brother   . Colon polyps Brother   . Colon polyps Brother   . Colon polyps Sister   . Esophageal cancer Sister   . Anxiety disorder Sister   . Depression Sister   . Colon polyps Sister   . Drug abuse Son   . Rectal cancer Neg Hx   . Stomach cancer Neg Hx    Social History   Socioeconomic History  . Marital status: Single    Spouse name: Not on file  . Number of children: Not on file  . Years of education: Not on file  . Highest education level: Not on file  Occupational History  . 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 Smoking status: Current Every Day Smoker    Packs/day: 0.50    Years: 40.00    Pack years: 20.00    Types: Cigarettes    Start date: 05/01/1969  . Smokeless tobacco: Never Used  . Tobacco comment: would like information. has tried chantix and wellbutrin  Vaping Use  . Vaping Use: Never used  Substance and Sexual  Activity  . Alcohol use: No    Alcohol/week:  0.0 standard drinks  . Drug use: No    Comment: per pt, Cocaine was in her system October and November 2016 when she went to the pain clinic 04-02-15.  Marland Kitchen Sexual activity: Not on file  Other Topics Concern  . Not on file  Social History Narrative   Single   Smoker   No alcohol or drug use reported         Social Determinants of Health   Financial Resource Strain: Low Risk   . Difficulty of Paying Living Expenses: Not very hard  Food Insecurity: Not on file  Transportation Needs: No Transportation Needs  . Lack of Transportation (Medical): No  . Lack of Transportation (Non-Medical): No  Physical Activity: Not on file  Stress: Not on file  Social Connections: Not on file    Activities of Daily Living In your present state of health, do you have any difficulty performing the following activities: 04/02/2020  Hearing? N  Vision? Y  Comment Has bilateral cataracts. Wears glasses  Difficulty concentrating or making decisions? N  Walking or climbing stairs? Y  Comment Due to left hip and leg pain.  Also due to back pain  Dressing or bathing? N  Doing errands, shopping? N  Preparing Food and eating ? N  Using the Toilet? N  In the past six months, have you accidently leaked urine? N  Do you have problems with loss of bowel control? Y  Comment At times and is seeing a GI specialist  Managing your Medications? N  Managing your Finances? N  Housekeeping or managing your Housekeeping? N  Some recent data might be hidden    Patient Education/ Literacy How often do you need to have someone help you when you read instructions, pamphlets, or other written materials from your doctor or pharmacy?: 1 - Never What is the last grade level you completed in school?: 10th  Exercise Current Exercise Habits: The patient does not participate in regular exercise at present, Exercise limited by: respiratory conditions(s);orthopedic  condition(s)  Diet Patient reports consuming 1- 2 meals a day and 3 snack(s) a day Patient reports that her primary diet is: Regular Patient reports that she does have regular access to food.   Depression Screen PHQ 2/9 Scores 04/02/2020 02/28/2020 08/22/2019 07/15/2018 07/08/2018 06/21/2018 04/28/2018  PHQ - 2 Score 4 4 1  0 0 3 0  PHQ- 9 Score 10 10 - - - 7 -  Some encounter information is confidential and restricted. Go to Review Flowsheets activity to see all data.     Fall Risk Fall Risk  04/02/2020 02/28/2020 08/22/2019 06/21/2018 09/02/2017  Falls in the past year? 0 0 1 0 No  Number falls in past yr: - - 1 - -  Injury with Fall? - - 0 - -  Risk for fall due to : - - History of fall(s) - -  Follow up - - Falls evaluation completed - -     Objective:  ADREAN COMEGYS seemed alert and oriented and she participated appropriately during our telephone visit.  Blood Pressure Weight BMI  BP Readings from Last 3 Encounters:  02/28/20 128/73  12/28/19 112/69  12/15/19 (!) 142/98   Wt Readings from Last 3 Encounters:  02/28/20 153 lb 4 oz (69.5 kg)  12/28/19 155 lb (70.3 kg)  12/15/19 155 lb (70.3 kg)   BMI Readings from Last 1 Encounters:  02/28/20 25.50 kg/m    *Unable to obtain current vital signs, weight, and BMI due to  telephone visit type  Hearing/Vision  . Mistelle did not seem to have difficulty with hearing/understanding during the telephone conversation . Reports that she has had a formal eye exam by an eye care professional within the past year . Reports that she has not had a formal hearing evaluation within the past year *Unable to fully assess hearing and vision during telephone visit type  Cognitive Function: 6CIT Screen 04/02/2020  What Year? 0 points  What month? 0 points  What time? 0 points  Count back from 20 0 points  Months in reverse 0 points  Repeat phrase 0 points  Total Score 0   (Normal:0-7, Significant for Dysfunction: >8)  Normal Cognitive Function  Screening: Yes   Immunization & Health Maintenance Record Immunization History  Administered Date(s) Administered  . Fluad Quad(high Dose 65+) 12/10/2018  . Influenza, High Dose Seasonal PF 12/05/2019  . Influenza,inj,Quad PF,6+ Mos 02/11/2013, 05/17/2015, 12/03/2015, 12/26/2016, 12/16/2017  . Influenza-Unspecified 12/15/2018  . Moderna Sars-Covid-2 Vaccination 05/05/2019, 06/17/2019  . Pneumococcal Conjugate-13 08/22/2019  . Tdap 08/22/2019  . Zoster 06/02/2014    Health Maintenance  Topic Date Due  . DEXA SCAN  Never done  . COVID-19 Vaccine (3 - Moderna risk 4-dose series) 07/15/2019  . PNA vac Low Risk Adult (2 of 2 - PPSV23) 08/21/2020  . MAMMOGRAM  07/26/2021  . COLONOSCOPY (Pts 45-42yrs Insurance coverage will need to be confirmed)  01/22/2023  . TETANUS/TDAP  08/21/2029  . INFLUENZA VACCINE  Completed  . Hepatitis C Screening  Completed       Assessment  This is a routine wellness examination for [name].  Health Maintenance: Due or Overdue Health Maintenance Due  Topic Date Due  . DEXA SCAN  Never done  . COVID-19 Vaccine (3 - Moderna risk 4-dose series) 07/15/2019    [name] does not need a referral for Community Assistance: Care Management:   no Social Work:    no Prescription Assistance:  no Nutrition/Diabetes Education:  no   Plan:  Personalized Goals Goals Addressed            This Visit's Progress   . AWV       04/02/2020 AWV Goal: Exercise for General Health   Patient will verbalize understanding of the benefits of increased physical activity:  Exercising regularly is important. It will improve your overall fitness, flexibility, and endurance.  Regular exercise also will improve your overall health. It can help you control your weight, reduce stress, and improve your bone density.  Over the next year, patient will increase physical activity as tolerated with a goal of at least 150 minutes of moderate physical activity per  week.   You can tell that you are exercising at a moderate intensity if your heart starts beating faster and you start breathing faster but can still hold a conversation.  Moderate-intensity exercise ideas include:  Walking 1 mile (1.6 km) in about 15 minutes  Biking  Hiking  Golfing  Dancing  Water aerobics  Patient will verbalize understanding of everyday activities that increase physical activity by providing examples like the following: ? Yard work, such as: ? Pushing a Conservation officer, nature ? Raking and bagging leaves ? Washing your car ? Pushing a stroller ? Shoveling snow ? Gardening ? Washing windows or floors  Patient will be able to explain general safety guidelines for exercising:   Before you start a new exercise program, talk with your health care provider.  Do not exercise so much that  you hurt yourself, feel dizzy, or get very short of breath.  Wear comfortable clothes and wear shoes with good support.  Drink plenty of water while you exercise to prevent dehydration or heat stroke.  Work out until your breathing and your heartbeat get faster.       Personalized Health Maintenance & Screening Recommendations  Bone densitometry screening  COVID Booster  Lung Cancer Screening Recommended: yes- Patient had CT chest on 03/22/20 (Low Dose CT Chest recommended if Age 54-80 years, 30 pack-year currently smoking OR have quit w/in past 15 years) Hepatitis C Screening recommended: no HIV Screening recommended: no  Advanced Directives: Written information was not prepared per patient's request.  Referrals & Orders No orders of the defined types were placed in this encounter.   Follow-up Plan . Follow-up with Claretta Fraise, MD as planned . Schedule COVID Booster once you get to feeling better . We will complete Dexa Scan at next visit.  AVS printed and mailed to patient    I have personally reviewed and noted the following in the patient's chart:   . Medical  and social history . Use of alcohol, tobacco or illicit drugs  . Current medications and supplements . Functional ability and status . Nutritional status . Physical activity . Advanced directives . List of other physicians . Hospitalizations, surgeries, and ER visits in previous 12 months . Vitals . Screenings to include cognitive, depression, and falls . Referrals and appointments  In addition, I have reviewed and discussed with [name] certain preventive protocols, quality metrics, and best practice recommendations. A written personalized care plan for preventive services as well as general preventive health recommendations is available and can be mailed to the patient at her request.      Lynnea Ferrier, LPN  [phone]

## 2020-04-19 ENCOUNTER — Other Ambulatory Visit: Payer: Self-pay

## 2020-04-19 ENCOUNTER — Ambulatory Visit (INDEPENDENT_AMBULATORY_CARE_PROVIDER_SITE_OTHER): Payer: Medicare Other | Admitting: Dermatology

## 2020-04-19 ENCOUNTER — Encounter: Payer: Self-pay | Admitting: Dermatology

## 2020-04-19 DIAGNOSIS — C4492 Squamous cell carcinoma of skin, unspecified: Secondary | ICD-10-CM

## 2020-04-19 DIAGNOSIS — C44329 Squamous cell carcinoma of skin of other parts of face: Secondary | ICD-10-CM

## 2020-04-19 DIAGNOSIS — C4491 Basal cell carcinoma of skin, unspecified: Secondary | ICD-10-CM

## 2020-04-19 DIAGNOSIS — C4441 Basal cell carcinoma of skin of scalp and neck: Secondary | ICD-10-CM

## 2020-04-19 NOTE — Patient Instructions (Signed)

## 2020-04-20 DIAGNOSIS — M6281 Muscle weakness (generalized): Secondary | ICD-10-CM | POA: Diagnosis not present

## 2020-04-22 ENCOUNTER — Encounter: Payer: Self-pay | Admitting: Dermatology

## 2020-04-22 NOTE — Progress Notes (Signed)
   Follow-Up Visit   Subjective  [name] is a 65 y.o. female who presents for the following: Procedure (Right melolabial fold- scc x 1 & right anterior neck).  Nonmelanoma skin cancers Location: Neck and face Duration:  Quality:  Associated Signs/Symptoms: Modifying Factors:  Severity:  Timing: Context: For treatment  Objective  Well appearing patient in no apparent distress; mood and affect are within normal limits. Objective  Right Melolabial Fold: Biopsy site identified by nurse and me.  Objective  Right Anterior Neck: This may be a recurrent lesion with a 6 mm papule and some evidence of scar anteriorly.  We did discuss Mohs surgery but elected to do a 2 mm excision with layered closure.    A focused examination was performed including Head and neck.. Relevant physical exam findings are noted in the Assessment and Plan.   Assessment & Plan    Squamous cell carcinoma of skin Right Melolabial Fold  Destruction of lesion Complexity: simple   Destruction method: electrodesiccation and curettage   Informed consent: discussed and consent obtained   Timeout:  patient name, date of birth, surgical site, and procedure verified Anesthesia: the lesion was anesthetized in a standard fashion   Anesthetic:  1% lidocaine w/ epinephrine 1-100,000 local infiltration Curettage performed in three different directions: Yes   Curettage cycles:  3 Lesion length (cm):  0.7 Lesion width (cm):  0.7 Margin per side (cm):  0 Final wound size (cm):  0.7 Hemostasis achieved with:  aluminum chloride Outcome: patient tolerated procedure well with no complications   Post-procedure details: wound care instructions given    Curettage showed no evidence of deep extension.  Basal cell carcinoma (BCC), unspecified site Right Anterior Neck  Skin excision  Lesion length (cm):  0.6 Lesion width (cm):  0.6 Margin per side (cm):  0.2 Total excision diameter (cm):  1 Informed  consent: discussed and consent obtained   Timeout: patient name, date of birth, surgical site, and procedure verified   Anesthesia: the lesion was anesthetized in a standard fashion   Anesthetic:  1% lidocaine w/ epinephrine 1-100,000 local infiltration Instrument used: #15 blade   Hemostasis achieved with: pressure and electrodesiccation   Outcome: patient tolerated procedure well with no complications   Post-procedure details: sterile dressing applied and wound care instructions given   Dressing type: bandage, petrolatum and pressure dressing   Additional details:  Anterior margin stained 5-0 vicryl x 1 5-0 ethilion x 4   Skin repair Complexity:  Intermediate Final length (cm):  1 Informed consent: discussed and consent obtained   Reason for type of repair: reduce tension to allow closure and reduce the risk of dehiscence, infection, and necrosis   Subcutaneous layers (deep stitches):  Suture size:  5-0 Suture type: Vicryl (polyglactin 910)   Stitches:  Buried horizontal mattress Fine/surface layer approximation (top stitches):  Suture size:  5-0 Suture type: nylon   Stitches: simple interrupted   Suture removal (days):  12 Hemostasis achieved with: suture  Specimen 1 - Surgical pathology Differential Diagnosis: bcc  [phone] Anterior margin stained  Check Margins: No      I, [name], MD, have reviewed all documentation for this visit.  The documentation on 04/22/20 for the exam, diagnosis, procedures, and orders are all accurate and complete.

## 2020-04-26 DIAGNOSIS — M542 Cervicalgia: Secondary | ICD-10-CM | POA: Diagnosis not present

## 2020-04-26 DIAGNOSIS — G5603 Carpal tunnel syndrome, bilateral upper limbs: Secondary | ICD-10-CM | POA: Diagnosis not present

## 2020-04-26 DIAGNOSIS — R201 Hypoesthesia of skin: Secondary | ICD-10-CM | POA: Diagnosis not present

## 2020-04-26 DIAGNOSIS — M545 Low back pain, unspecified: Secondary | ICD-10-CM | POA: Diagnosis not present

## 2020-04-26 DIAGNOSIS — G43909 Migraine, unspecified, not intractable, without status migrainosus: Secondary | ICD-10-CM | POA: Diagnosis not present

## 2020-04-30 ENCOUNTER — Ambulatory Visit: Payer: Medicare Other

## 2020-05-01 ENCOUNTER — Encounter (HOSPITAL_COMMUNITY): Payer: Self-pay

## 2020-05-01 ENCOUNTER — Telehealth (INDEPENDENT_AMBULATORY_CARE_PROVIDER_SITE_OTHER): Payer: Medicare Other | Admitting: Psychiatry

## 2020-05-01 ENCOUNTER — Other Ambulatory Visit: Payer: Self-pay

## 2020-05-01 ENCOUNTER — Encounter (HOSPITAL_COMMUNITY): Payer: Self-pay | Admitting: Psychiatry

## 2020-05-01 DIAGNOSIS — F332 Major depressive disorder, recurrent severe without psychotic features: Secondary | ICD-10-CM

## 2020-05-01 DIAGNOSIS — F411 Generalized anxiety disorder: Secondary | ICD-10-CM | POA: Diagnosis not present

## 2020-05-01 MED ORDER — DULOXETINE HCL 60 MG PO CPEP
60.0000 mg | ORAL_CAPSULE | Freq: Two times a day (BID) | ORAL | 2 refills | Status: DC
Start: 2020-05-01 — End: 2020-07-19

## 2020-05-01 MED ORDER — CLONAZEPAM 1 MG PO TABS: 1.0000 mg | ORAL_TABLET | Freq: Three times a day (TID) | ORAL | 2 refills | Status: DC | PRN
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
## 2020-05-04 ENCOUNTER — Other Ambulatory Visit: Payer: Self-pay | Admitting: Family Medicine

## 2020-05-09 ENCOUNTER — Ambulatory Visit: Payer: Medicare Other | Admitting: Pulmonary Disease

## 2020-05-19 ENCOUNTER — Other Ambulatory Visit: Payer: Self-pay | Admitting: Cardiology

## 2020-05-20 DIAGNOSIS — M6281 Muscle weakness (generalized): Secondary | ICD-10-CM | POA: Diagnosis not present

## 2020-06-13 ENCOUNTER — Ambulatory Visit: Payer: Medicare Other | Admitting: Cardiology

## 2020-06-13 NOTE — Progress Notes (Deleted)
Clinical Summary Ms. Lafrance is a 65 y.o.female seen today for follow up of the following medical problems.  1. Atypical chest pain/CAD - long history of atypical chest pain - cath 2008 nonobstrucive CAD - 07/2013 nuclear stress: no ischemia 01/2018 CT for lung cancer screening showed aortic atherosclerosis, with LM disease, LAD, and LCX   CAD risk factors: hyperlipidemia, HTN, +tobacco, both parents MIs in 57 to 24s, brother MI in his 60s  Jan 2020 nuclear stress no ischemia  - mild chest pains at times, mid chest or at times in rib cage. Can occur with position changes     2. COPD - followed by pulmonary    3. HTN - compliant with med  4. Aortic stenosis - 01/2020 echo mild AS, mean grad12 AVA VTI 1.69  4. Hyperlipidemia - 08/2019 LDL 105 - compliant with statin   Had moderna vaccine x 2     Past Medical History:  Diagnosis Date  . Allergy   . Arthritis   . Atypical mole 06/05/2003   slight-mod-Left scapula (WS)  . Atypical nevus 07/26/2003   persist. dysp- Left scapula (WS)  . Basal cell carcinoma 06/05/2003   RIght chest (CX35FU)  . Basal cell carcinoma 05/22/2004   superificial- RIght upper back- (CX35FU)  . Basal cell carcinoma 11/27/2004   beside R nostril-(MOHS), below right outer nose (MOHS)  . Basal cell carcinoma 11/08/2009   left post shoulder -(txpbx)  . Basal cell carcinoma 05/24/2012   ulcerated- Left post shoulder- (CX35FU), ulcerated-mid forehead (EXC )  . Basal cell carcinoma 06/24/2012   superificial- Right shoulder - (txpbx)   . Basal cell carcinoma 07/29/2012    sclerosis- mid forehead (MOHS)  . Basal cell carcinoma 06/06/2019   nod-right anterior neck (CX35FU)  . Cataract   . Constipation    uses OTC meds with help  . COPD (chronic obstructive pulmonary disease) ([place])   . Coronary artery disease    minimal nonobstructive   . COVID   . Depression   . GERD (gastroesophageal reflux disease)   . Heart  murmur   . Hx of adenomatous polyp of colon 10/03/2014  . Hypercholesteremia   . Hypertension   . Panic attacks   . Perimenopausal   . Skin cancer   . Squamous cell carcinoma of skin 06/05/2003   Right Temple(CX35FU)  . Squamous cell carcinoma of skin 05/22/2004   in situ- Left upperarm (CX35FU)  . Squamous cell carcinoma of skin 11/27/2004   in situ- above Left elbow (CX35FU)  . Squamous cell carcinoma of skin 03/04/2006   in situ- Left sideburn (CX35FU)  . Squamous cell carcinoma of skin 11/26/2006   in situ- mid brow (Cx35FU)  . Squamous cell carcinoma of skin 05/24/2012   in situ- Left chest- (CX35FU)  . Squamous cell carcinoma of skin 04/05/2013   in situ- Left nose (Cx35FU)  . Squamous cell carcinoma of skin 08/17/2013   well diff-above Left eye (txpbx)  . Squamous cell carcinoma of skin 07/30/2016   in situ- right chest sup (Cx35FU)  . Squamous cell carcinoma of skin 05/12/2017   in situ- RIght shoulder (CX35FU), in situ- Left forehead (CX35FU)  . Squamous cell carcinoma of skin 11/23/2018   in situ- Right mid shin, inner- (MOHS)  . Tobacco user      Allergies  Allergen Reactions  . Lorcet 10-650 [Hydrocodone-Acetaminophen] Itching  . Sulfa Antibiotics Other (See Comments)    aching  . Tape Other (See Comments)  Please use paper tape     Current Outpatient Medications  Medication Sig Dispense Refill  . albuterol (VENTOLIN HFA) 108 (90 Base) MCG/ACT inhaler Inhale 2 puffs into the lungs every 4 (four) hours as needed for wheezing or shortness of breath. 3 Inhaler 3  . amLODipine (NORVASC) 10 MG tablet TAKE ONE TABLET BY MOUTH DAILY 90 tablet 1  . amLODipine (NORVASC) 5 MG tablet TAKE 1 TABLET BY MOUTH  DAILY 90 tablet 1  . Ascorbic Acid (VITAMIN C) 1000 MG tablet Take 1,000 mg by mouth daily.    Marland Kitchen aspirin EC 81 MG tablet Take 81 mg by mouth daily.    Marland Kitchen atorvastatin (LIPITOR) 40 MG tablet TAKE 1 TABLET BY MOUTH AT  BEDTIME 90 tablet 1  . budesonide-formoterol  (SYMBICORT) 160-4.5 MCG/ACT inhaler INHALE TWO PUFFS INTO THE LUNGS BY MOUTH TWICE DAILY 10.2 g 5  . cholecalciferol (VITAMIN D3) 25 MCG (1000 UNIT) tablet Take 1,000 Units by mouth daily.    . clonazePAM (KLONOPIN) 1 MG tablet Take 1 tablet (1 mg total) by mouth 3 (three) times daily as needed. for anxiety 90 tablet 2  . cloNIDine (CATAPRES) 0.1 MG tablet Take 1 tablet (0.1 mg total) by mouth at bedtime. 90 tablet 1  . dexlansoprazole (DEXILANT) 60 MG capsule TAKE ONE CAPSULE BY MOUTH DAILY ON EMPTY STOMACH, THEN DO NOT EAT FOR ONE HOUR. (REPLACES PANTROPRAZOLE FOR REFLUX) 90 capsule 1  . DULoxetine (CYMBALTA) 60 MG capsule Take 1 capsule (60 mg total) by mouth 2 (two) times daily. 60 capsule 2  . fexofenadine (ALLEGRA) 180 MG tablet Take 1 tablet (180 mg total) by mouth daily. For allergy symptoms 30 tablet 11  . fish oil-omega-3 fatty acids 1000 MG capsule Take 1 g by mouth daily.     Marland Kitchen gabapentin (NEURONTIN) 300 MG capsule Take 300-600 mg by mouth See admin instructions. Take 1 capsule in the morning and 2 capsule at bedtime    . gabapentin (NEURONTIN) 600 MG tablet Take 600 mg by mouth 3 (three) times daily.    Marland Kitchen ipratropium-albuterol (DUONEB) 0.5-2.5 (3) MG/3ML SOLN Take 3 mLs by nebulization every 6 (six) hours as needed. 360 mL 5  . lisinopril (ZESTRIL) 20 MG tablet TAKE 1 TABLET BY MOUTH  DAILY 90 tablet 1  . meloxicam (MOBIC) 15 MG tablet Take 15 mg by mouth daily.    Marland Kitchen oxyCODONE (ROXICODONE) 15 MG immediate release tablet Take 15 mg by mouth 4 (four) times daily as needed.    . pantoprazole (PROTONIX) 40 MG tablet TAKE 1 TABLET BY MOUTH  TWICE DAILY 180 tablet 1  . sucralfate (CARAFATE) 1 g tablet TAKE ONE TABLET BY MOUTH TWICE DAILY 60 tablet 2  . Tiotropium Bromide Monohydrate (SPIRIVA RESPIMAT) 2.5 MCG/ACT AERS INHALE TWO PUFFS INTO THE LUNGS DAILY 4 g 5   No current facility-administered medications for this visit.     Past Surgical History:  Procedure Laterality Date  .  ABDOMINAL HYSTERECTOMY    . COLONOSCOPY    . Right foot toe surgery    . UPPER GASTROINTESTINAL ENDOSCOPY    . vocal cord     polyp removal     Allergies  Allergen Reactions  . Lorcet 10-650 [Hydrocodone-Acetaminophen] Itching  . Sulfa Antibiotics Other (See Comments)    aching  . Tape Other (See Comments)    Please use paper tape      Family History  Problem Relation Age of Onset  . Heart disease Mother   .  Heart attack Mother   . Heart attack Father   . Heart attack Brother   . Colon cancer Brother 3       died at age 41  . Anxiety disorder Brother   . Depression Brother   . Alcohol abuse Brother   . Colon polyps Brother   . Colon polyps Brother   . Colon polyps Sister   . Esophageal cancer Sister   . Anxiety disorder Sister   . Depression Sister   . Colon polyps Sister   . Drug abuse Son   . Rectal cancer Neg Hx   . Stomach cancer Neg Hx      Social History Ms. Kabel reports that she has been smoking cigarettes. She started smoking about 51 years ago. She has a 20.00 pack-year smoking history. She has never used smokeless tobacco. Ms. Minner reports no history of alcohol use.   Review of Systems CONSTITUTIONAL: No weight loss, fever, chills, weakness or fatigue.  HEENT: Eyes: No visual loss, blurred vision, double vision or yellow sclerae.No hearing loss, sneezing, congestion, runny nose or sore throat.  SKIN: No rash or itching.  CARDIOVASCULAR:  RESPIRATORY: No shortness of breath, cough or sputum.  GASTROINTESTINAL: No anorexia, nausea, vomiting or diarrhea. No abdominal pain or blood.  GENITOURINARY: No burning on urination, no polyuria NEUROLOGICAL: No headache, dizziness, syncope, paralysis, ataxia, numbness or tingling in the extremities. No change in bowel or bladder control.  MUSCULOSKELETAL: No muscle, back pain, joint pain or stiffness.  LYMPHATICS: No enlarged nodes. No history of splenectomy.  PSYCHIATRIC: No history of depression or  anxiety.  ENDOCRINOLOGIC: No reports of sweating, cold or heat intolerance. No polyuria or polydipsia.  Marland Kitchen   Physical Examination There were no vitals filed for this visit. There were no vitals filed for this visit.  Gen: resting comfortably, no acute distress HEENT: no scleral icterus, pupils equal round and reactive, no palptable cervical adenopathy,  CV Resp: Clear to auscultation bilaterally GI: abdomen is soft, non-tender, non-distended, normal bowel sounds, no hepatosplenomegaly MSK: extremities are warm, no edema.  Skin: warm, no rash Neuro:  no focal deficits Psych: appropriate affect   Diagnostic Studies 06/2006 cath FINDINGS: Aortic pressure 125/78 with a mean of 99. Left ventricular pressure 125/5 with an end-diastolic pressure of 10.  The left mainstem is angiographically normal. It bifurcates into the LAD and left circumflex.  The LAD is a large-caliber vessel that courses down to the LV apex. It gives off a first diagonal Juanice Warburton and is a large vessel. There is no significant disease in the LAD or diagonal system.  Left circumflex is a large-caliber vessel. There is a small intermediate Beverly Ferner present. There are really no substantial obtuse marginal branches from the left circumflex present. The left circumflex courses down the AV groove and gives off a large posterolateral Mailen Newborn. The mid portion of the left circumflex has a long segment of 30-40% stenosis.  The right coronary artery is a large-caliber vessel. It terminates distally in a PDA and posterior AV segment which gives off two posterolateral branches. The mid portion of the right coronary artery provides a small RV marginal Rosemary Pentecost, and the proximal portion has a small conus Reigna Ruperto present. There are minor luminal irregularities in the proximal portion of the right coronary artery, but there is no significant angiographic disease.  Left ventriculography performed in  the 30-degree RAO projection demonstrates normal left ventricular systolic function with an LVEF of 55%. There is no mitral regurgitation.  ASSESSMENT: 1.  Nonobstructive coronary artery disease, predominantly involving the left circumflex system. 2. Normal left ventricular function. 3. Intracoronary vascular ultrasound demonstration of minimal plaque in the right coronary artery.  PLAN: Ms. Delamar will require aggressive medical therapy. She needs tobacco cessation and progressive statin therapy. She will receive her statin therapy through the [facility] protocol. She should be on a daily aspirin as well. She will be a candidate for early discharge as her chest pain appears to have been noncardiac, and she has had no further pain.   Jan 2020 nuclear stress  There was no ST segment deviation noted during stress.  Defect 1: There is a medium defect of mild severity present in the basal inferior, mid inferior and apical inferior location. This is likely due to soft tissue attenuation. No evidence of ischemia.  This is a low risk study.  Nuclear stress EF: 58%.   01/2020 echo IMPRESSIONS    1. Left ventricular ejection fraction, by estimation, is 60 to 65%. The  left ventricle has normal function. The left ventricle has no regional  wall motion abnormalities. Left ventricular diastolic parameters are  consistent with Grade I diastolic  dysfunction (impaired relaxation).  2. Right ventricular systolic function is normal. The right ventricular  size is normal.  3. The mitral valve is normal in structure. No evidence of mitral valve  regurgitation. No evidence of mitral stenosis.  4. The aortic valve has an indeterminant number of cusps. There is mild  calcification of the aortic valve. There is mild thickening of the aortic  valve. Aortic valve regurgitation is mild. Mild aortic valve stenosis.  Mild aortic stenosis is present.  Aortic valve  mean gradient measures 12.0 mmHg. Aortic valve peak gradient  measures 23.1 mmHg. Aortic valve area, by VTI measures 1.69 cm.  5. The inferior vena cava is normal in size with greater than 50%  respiratory variability, suggesting right atrial pressure of 3 mmHg.      Assessment and Plan  1. CAD/Atypical chest pain - long history of atypical chest pain - nonobstructive CAD by cath 2008, negative nuclear stress 2015and more recently Jan 2020 -recent MSK pains that are positional, not cardiac  - continue to monitor.   2. Heart murmur - obtain echo   3. HTN - above goal, increase norvasc to 10mg  daily.       Arnoldo Lenis, M.D., F.A.C.C.

## 2020-06-19 DIAGNOSIS — M6281 Muscle weakness (generalized): Secondary | ICD-10-CM | POA: Diagnosis not present

## 2020-06-20 ENCOUNTER — Other Ambulatory Visit: Payer: Self-pay

## 2020-06-20 ENCOUNTER — Encounter: Payer: Self-pay | Admitting: Pulmonary Disease

## 2020-06-20 ENCOUNTER — Ambulatory Visit (INDEPENDENT_AMBULATORY_CARE_PROVIDER_SITE_OTHER): Payer: Medicare Other | Admitting: Pulmonary Disease

## 2020-06-20 VITALS — BP 132/80 | HR 88 | Temp 97.4°F | Ht 65.0 in | Wt 158.0 lb

## 2020-06-20 DIAGNOSIS — F17210 Nicotine dependence, cigarettes, uncomplicated: Secondary | ICD-10-CM | POA: Diagnosis not present

## 2020-06-20 DIAGNOSIS — F17200 Nicotine dependence, unspecified, uncomplicated: Secondary | ICD-10-CM

## 2020-06-20 DIAGNOSIS — J449 Chronic obstructive pulmonary disease, unspecified: Secondary | ICD-10-CM | POA: Diagnosis not present

## 2020-06-20 MED ORDER — CHANTIX STARTING MONTH PAK 0.5 MG X 11 & 1 MG X 42 PO TABS: ORAL_TABLET | ORAL | 0 refills | Status: DC

## 2020-06-20 MED ORDER — AZITHROMYCIN 250 MG PO TABS: ORAL_TABLET | ORAL | 0 refills | Status: DC

## 2020-06-20 MED ORDER — NICOTINE 21 MG/24HR TD PT24: 21.0000 mg | MEDICATED_PATCH | Freq: Every day | TRANSDERMAL | 0 refills | Status: DC

## 2020-06-20 MED ORDER — PREDNISONE 20 MG PO TABS: ORAL_TABLET | ORAL | 0 refills | Status: DC

## 2020-06-20 NOTE — Patient Instructions (Signed)
Continue the inhalers as prescribed We will give you a Z-Pak and prednisone 40 mg a day for 5 days Continue nebulizers.  Will order new nebulizer machine  Please work on smoking cessation.  Order nicotine patches and Chantix  Follow-up in 6 months.

## 2020-06-20 NOTE — Progress Notes (Signed)
[name]    [id number]    16-Apr-1955  Primary Care Physician:Stacks, Cletus Gash, MD  Referring Physician: Claretta Fraise, MD [place],  [place] [id number]  Chief complaint: Follow up for COPD  HPI: Active smoker with history of coronary artery disease, COPD Here for follow-up of COPD.  Maintained on Symbicort and Spiriva dyspnea with wheezing.    Evaluated for hypersensitivity pneumonitis in 2020 due to ongoing mold exposure.  CT is not typical for hypersensitivity ILD and HP panel is negative  Evaluated in the ED in January 2021 for pleuritic right chest pain with negative CTA, negative cardiac work-up  Pets: Has 2 dogs, no birds, farm animals Occupation: Used to work in a Research officer, trade union.  Currently on disability Exposures: Reports living in a home with significant mold exposure for the past 10 months. Smoking history: 50 pack year smoker.  Continues to smoke 1 pack/day Travel history: No significant travel history Relevant family history: Sister has COPD.  Interim history: She continues to have pleuritic type pain which is unchanged Complains of increasing cough with chest congestion, green mucus and dyspnea Continues to smoke 1 pack/day   Outpatient Encounter Medications as of 06/20/2020  Medication Sig  . albuterol (VENTOLIN HFA) 108 (90 Base) MCG/ACT inhaler Inhale 2 puffs into the lungs every 4 (four) hours as needed for wheezing or shortness of breath.  Marland Kitchen amLODipine (NORVASC) 10 MG tablet TAKE ONE TABLET BY MOUTH DAILY  . Ascorbic Acid (VITAMIN C) 1000 MG tablet Take 1,000 mg by mouth daily.  Marland Kitchen aspirin EC 81 MG tablet Take 81 mg by mouth daily.  Marland Kitchen atorvastatin (LIPITOR) 40 MG tablet TAKE 1 TABLET BY MOUTH AT  BEDTIME  . budesonide-formoterol (SYMBICORT) 160-4.5 MCG/ACT inhaler INHALE TWO PUFFS INTO THE LUNGS BY MOUTH TWICE DAILY  . cholecalciferol (VITAMIN D3) 25 MCG (1000 UNIT) tablet Take 1,000 Units by mouth daily.  . clonazePAM (KLONOPIN) 1 MG  tablet Take 1 tablet (1 mg total) by mouth 3 (three) times daily as needed. for anxiety  . cloNIDine (CATAPRES) 0.1 MG tablet Take 1 tablet (0.1 mg total) by mouth at bedtime.  Marland Kitchen dexlansoprazole (DEXILANT) 60 MG capsule TAKE ONE CAPSULE BY MOUTH DAILY ON EMPTY STOMACH, THEN DO NOT EAT FOR ONE HOUR. (REPLACES [place])  . DULoxetine (CYMBALTA) 60 MG capsule Take 1 capsule (60 mg total) by mouth 2 (two) times daily.  . fexofenadine (ALLEGRA) 180 MG tablet Take 1 tablet (180 mg total) by mouth daily. For allergy symptoms  . fish oil-omega-3 fatty acids 1000 MG capsule Take 1 g by mouth daily.   Marland Kitchen gabapentin (NEURONTIN) 300 MG capsule Take 300-600 mg by mouth See admin instructions. Take 1 capsule in the morning and 2 capsule at bedtime  . gabapentin (NEURONTIN) 600 MG tablet Take 600 mg by mouth 3 (three) times daily.  Marland Kitchen lisinopril (ZESTRIL) 20 MG tablet TAKE 1 TABLET BY MOUTH  DAILY  . meloxicam (MOBIC) 15 MG tablet Take 15 mg by mouth daily.  Marland Kitchen oxyCODONE (ROXICODONE) 15 MG immediate release tablet Take 15 mg by mouth 4 (four) times daily as needed.  . pantoprazole (PROTONIX) 40 MG tablet TAKE 1 TABLET BY MOUTH  TWICE DAILY  . sucralfate (CARAFATE) 1 g tablet TAKE ONE TABLET BY MOUTH TWICE DAILY  . Tiotropium Bromide Monohydrate (SPIRIVA RESPIMAT) 2.5 MCG/ACT AERS INHALE TWO PUFFS INTO THE LUNGS DAILY  . [DISCONTINUED] amLODipine (NORVASC) 5 MG tablet TAKE 1  TABLET BY MOUTH  DAILY  . ipratropium-albuterol (DUONEB) 0.5-2.5 (3) MG/3ML SOLN Take 3 mLs by nebulization every 6 (six) hours as needed. (Patient not taking: Reported on 06/20/2020)   No facility-administered encounter medications on file as of 06/20/2020.    Physical Exam: Blood pressure 132/80, pulse 88, temperature (!) 97.4 F (36.3 C), temperature source Temporal, height 5\' 5"  (1.651 m), weight 158 lb (71.7 kg), SpO2 95 %. Gen:      No acute distress HEENT:  EOMI, sclera anicteric Neck:     No masses; no  thyromegaly Lungs:    Clear to auscultation bilaterally; normal respiratory effort CV:         Regular rate and rhythm; no murmurs Abd:      + bowel sounds; soft, non-tender; no palpable masses, no distension Ext:    No edema; adequate peripheral perfusion Skin:      Warm and dry; no rash Neuro: alert and oriented x 3 Psych: normal mood and affect  Data Reviewed: Imaging: CT chest screening 01/07/2018- small 3.4 mm right lower lobe lung nodule.  Mild emphysematous changes. CTA 03/06/2019-no pulmonary embolism or lung abnormality.  CT chest screening 03/22/2020- slightly smaller right lower lobe lung nodule measuring 2.6 mm, advanced atherosclerosis and aortic wall calcification. I have reviewed the images personally  PFTs: 12/05/2019 FVC 2.47 [76%], FEV1 1.58 [63%], F/F 64, TLC 6.21 [121%], DLCO 18.45 [90%] Moderate obstruction  Labs: CBC 05/25/2017-WBC 10.3, eos 1%, absolute eosinophil count 103 CBC 08/22/2019-WBC 8.9, eos 1%, 30 significant count 89 IgE 05/27/2018-7 Hypersensitivity panel 05/27/2018-negative  Assessment:  Moderate COPD With mild exacerbation today Continue Symbicort, Spiriva, nebs Z-Pak, Pred for 5 days  Pleuritic chest pain, likely musculoskeletal Negative CTA in 2021 and negative screening chest in 2022  Active smoker Smoking cessation discussed.  She is willing to quit.  Prescribe nicotine patches and Chantix Reassess at return visit.  Time spent counseling-5 minutes  Coronary atherosclerosis, aortic valve calcification Follows with Dr. Harl Bowie, cardiology  Plan/Recommendations: - Continue Symbicort, Spiriva - Prednisone for 5 days, Z-Pak - Smoking cessation with nicotine patches. - Smoking cessation with nicotine, Chantix  Marshell Garfinkel MD [facility] 06/20/2020, 10:47 AM  CC: Claretta Fraise, MD

## 2020-06-21 DIAGNOSIS — R201 Hypoesthesia of skin: Secondary | ICD-10-CM | POA: Diagnosis not present

## 2020-06-21 DIAGNOSIS — M542 Cervicalgia: Secondary | ICD-10-CM | POA: Diagnosis not present

## 2020-06-21 DIAGNOSIS — G43909 Migraine, unspecified, not intractable, without status migrainosus: Secondary | ICD-10-CM | POA: Diagnosis not present

## 2020-06-21 DIAGNOSIS — M62838 Other muscle spasm: Secondary | ICD-10-CM | POA: Diagnosis not present

## 2020-06-21 DIAGNOSIS — Z79899 Other long term (current) drug therapy: Secondary | ICD-10-CM | POA: Diagnosis not present

## 2020-06-21 DIAGNOSIS — M545 Low back pain, unspecified: Secondary | ICD-10-CM | POA: Diagnosis not present

## 2020-07-09 ENCOUNTER — Ambulatory Visit (INDEPENDENT_AMBULATORY_CARE_PROVIDER_SITE_OTHER): Payer: Medicare Other | Admitting: Cardiology

## 2020-07-09 ENCOUNTER — Encounter: Payer: Self-pay | Admitting: Cardiology

## 2020-07-09 VITALS — BP 140/80 | HR 86 | Ht 65.0 in | Wt 157.0 lb

## 2020-07-09 DIAGNOSIS — I2510 Atherosclerotic heart disease of native coronary artery without angina pectoris: Secondary | ICD-10-CM | POA: Diagnosis not present

## 2020-07-09 DIAGNOSIS — I350 Nonrheumatic aortic (valve) stenosis: Secondary | ICD-10-CM | POA: Diagnosis not present

## 2020-07-09 DIAGNOSIS — E782 Mixed hyperlipidemia: Secondary | ICD-10-CM

## 2020-07-09 NOTE — Progress Notes (Signed)
Clinical Summary Ms. Nowakowski is a 65 y.o.female seen today for follow up of the following medical problems.  1. Atypical chest pain/CAD - long history of atypical chest pain - cath 2008 nonobstrucive CAD - 07/2013 nuclear stress: no ischemia 01/2018 CT for lung cancer screening showed aortic atherosclerosis, with LM disease, LAD, and LCX   CAD risk factors: hyperlipidemia, HTN, +tobacco, both parents MIs in 12 to 75s, brother MI in his 34s  Jan 2020 nuclear stress no ischemia  - some recent chest pains tied with COPD exacerbation.  - compliant with meds   2. COPD - followed by pulmonary  - recent wheezing, cough. Nonproductive.  - compliant with inhalers.  - recently completed course of zpack and prednisone.    3. HTN - compliant with med - other clinic visits last few months has been at goal - recent stress with financial issues  4. Hyperlipidemia - 02/2020 TC 183 TG 116 HDL 53 LDL 109 - compliant with statin   5. Aortic stenosis - 01/2020 echo mild AS mean grad 12 AVA VTI 1.69 - needs repeats in 2024 Past Medical History:  Diagnosis Date  . Allergy   . Arthritis   . Atypical mole 06/05/2003   slight-mod-Left scapula (WS)  . Atypical nevus 07/26/2003   persist. dysp- Left scapula (WS)  . Basal cell carcinoma 06/05/2003   RIght chest (CX35FU)  . Basal cell carcinoma 05/22/2004   superificial- RIght upper back- (CX35FU)  . Basal cell carcinoma 11/27/2004   beside R nostril-(MOHS), below right outer nose (MOHS)  . Basal cell carcinoma 11/08/2009   left post shoulder -(txpbx)  . Basal cell carcinoma 05/24/2012   ulcerated- Left post shoulder- (CX35FU), ulcerated-mid forehead (EXC )  . Basal cell carcinoma 06/24/2012   superificial- Right shoulder - (txpbx)   . Basal cell carcinoma 07/29/2012    sclerosis- mid forehead (MOHS)  . Basal cell carcinoma 06/06/2019   nod-right anterior neck (CX35FU)  . Cataract   . Constipation    uses OTC  meds with help  . COPD (chronic obstructive pulmonary disease) ([place])   . Coronary artery disease    minimal nonobstructive   . COVID   . Depression   . GERD (gastroesophageal reflux disease)   . Heart murmur   . Hx of adenomatous polyp of colon 10/03/2014  . Hypercholesteremia   . Hypertension   . Panic attacks   . Perimenopausal   . Skin cancer   . Squamous cell carcinoma of skin 06/05/2003   Right [place](CX35FU)  . Squamous cell carcinoma of skin 05/22/2004   in situ- Left upperarm (CX35FU)  . Squamous cell carcinoma of skin 11/27/2004   in situ- above Left elbow (CX35FU)  . Squamous cell carcinoma of skin 03/04/2006   in situ- Left sideburn (CX35FU)  . Squamous cell carcinoma of skin 11/26/2006   in situ- mid brow (Cx35FU)  . Squamous cell carcinoma of skin 05/24/2012   in situ- Left chest- (CX35FU)  . Squamous cell carcinoma of skin 04/05/2013   in situ- Left nose (Cx35FU)  . Squamous cell carcinoma of skin 08/17/2013   well diff-above Left eye (txpbx)  . Squamous cell carcinoma of skin 07/30/2016   in situ- right chest sup (Cx35FU)  . Squamous cell carcinoma of skin 05/12/2017   in situ- RIght shoulder (CX35FU), in situ- Left forehead (CX35FU)  . Squamous cell carcinoma of skin 11/23/2018   in situ- Right mid shin, inner- (MOHS)  . Tobacco user  Allergies  Allergen Reactions  . Lorcet 10-650 [Hydrocodone-Acetaminophen] Itching  . Sulfa Antibiotics Other (See Comments)    aching  . Tape Other (See Comments)    Please use paper tape     Current Outpatient Medications  Medication Sig Dispense Refill  . albuterol (VENTOLIN HFA) 108 (90 Base) MCG/ACT inhaler Inhale 2 puffs into the lungs every 4 (four) hours as needed for wheezing or shortness of breath. 3 Inhaler 3  . amLODipine (NORVASC) 10 MG tablet TAKE ONE TABLET BY MOUTH DAILY 90 tablet 1  . Ascorbic Acid (VITAMIN C) 1000 MG tablet Take 1,000 mg by mouth daily.    Marland Kitchen aspirin EC 81 MG tablet Take 81 mg by  mouth daily.    Marland Kitchen atorvastatin (LIPITOR) 40 MG tablet TAKE 1 TABLET BY MOUTH AT  BEDTIME 90 tablet 1  . azithromycin (ZITHROMAX) 250 MG tablet Take as directed 6 tablet 0  . budesonide-formoterol (SYMBICORT) 160-4.5 MCG/ACT inhaler INHALE TWO PUFFS INTO THE LUNGS BY MOUTH TWICE DAILY 10.2 g 5  . cholecalciferol (VITAMIN D3) 25 MCG (1000 UNIT) tablet Take 1,000 Units by mouth daily.    . clonazePAM (KLONOPIN) 1 MG tablet Take 1 tablet (1 mg total) by mouth 3 (three) times daily as needed. for anxiety 90 tablet 2  . cloNIDine (CATAPRES) 0.1 MG tablet Take 1 tablet (0.1 mg total) by mouth at bedtime. 90 tablet 1  . dexlansoprazole (DEXILANT) 60 MG capsule TAKE ONE CAPSULE BY MOUTH DAILY ON EMPTY STOMACH, THEN DO NOT EAT FOR ONE HOUR. (REPLACES PANTROPRAZOLE FOR REFLUX) 90 capsule 1  . DULoxetine (CYMBALTA) 60 MG capsule Take 1 capsule (60 mg total) by mouth 2 (two) times daily. 60 capsule 2  . fexofenadine (ALLEGRA) 180 MG tablet Take 1 tablet (180 mg total) by mouth daily. For allergy symptoms 30 tablet 11  . fish oil-omega-3 fatty acids 1000 MG capsule Take 1 g by mouth daily.     Marland Kitchen gabapentin (NEURONTIN) 300 MG capsule Take 300-600 mg by mouth See admin instructions. Take 1 capsule in the morning and 2 capsule at bedtime    . gabapentin (NEURONTIN) 600 MG tablet Take 600 mg by mouth 3 (three) times daily.    Marland Kitchen ipratropium-albuterol (DUONEB) 0.5-2.5 (3) MG/3ML SOLN Take 3 mLs by nebulization every 6 (six) hours as needed. (Patient not taking: Reported on 06/20/2020) 360 mL 5  . lisinopril (ZESTRIL) 20 MG tablet TAKE 1 TABLET BY MOUTH  DAILY 90 tablet 1  . meloxicam (MOBIC) 15 MG tablet Take 15 mg by mouth daily.    . nicotine (NICODERM CQ - DOSED IN MG/24 HOURS) 21 mg/24hr patch Place 1 patch (21 mg total) onto the skin daily. 28 patch 0  . oxyCODONE (ROXICODONE) 15 MG immediate release tablet Take 15 mg by mouth 4 (four) times daily as needed.    . pantoprazole (PROTONIX) 40 MG tablet TAKE 1 TABLET  BY MOUTH  TWICE DAILY 180 tablet 1  . predniSONE (DELTASONE) 20 MG tablet 40 mg daily for 5 days 10 tablet 0  . sucralfate (CARAFATE) 1 g tablet TAKE ONE TABLET BY MOUTH TWICE DAILY 60 tablet 2  . Tiotropium Bromide Monohydrate (SPIRIVA RESPIMAT) 2.5 MCG/ACT AERS INHALE TWO PUFFS INTO THE LUNGS DAILY 4 g 5  . varenicline (CHANTIX STARTING MONTH PAK) 0.5 MG X 11 & 1 MG X 42 tablet Take 1 0.5 mg tablet once daily for 3 days, increase to 1 0.5 mg tablet twice daily for 4 days, increase to 1  1 mg tablet twice daily. 53 tablet 0   No current facility-administered medications for this visit.     Past Surgical History:  Procedure Laterality Date  . ABDOMINAL HYSTERECTOMY    . COLONOSCOPY    . Right foot toe surgery    . UPPER GASTROINTESTINAL ENDOSCOPY    . vocal cord     polyp removal     Allergies  Allergen Reactions  . Lorcet 10-650 [Hydrocodone-Acetaminophen] Itching  . Sulfa Antibiotics Other (See Comments)    aching  . Tape Other (See Comments)    Please use paper tape      Family History  Problem Relation Age of Onset  . Heart disease Mother   . Heart attack Mother   . Heart attack Father   . Heart attack Brother   . Colon cancer Brother 72       died at age 17  . Anxiety disorder Brother   . Depression Brother   . Alcohol abuse Brother   . Colon polyps Brother   . Colon polyps Brother   . Colon polyps Sister   . Esophageal cancer Sister   . Anxiety disorder Sister   . Depression Sister   . Colon polyps Sister   . Drug abuse Son   . Rectal cancer Neg Hx   . Stomach cancer Neg Hx      Social History Ms. Fahy reports that she has been smoking cigarettes. She started smoking about 51 years ago. She has a 40.00 pack-year smoking history. She has never used smokeless tobacco. Ms. Burack reports no history of alcohol use.   Review of Systems CONSTITUTIONAL: No weight loss, fever, chills, weakness or fatigue.  HEENT: Eyes: No visual loss, blurred vision,  double vision or yellow sclerae.No hearing loss, sneezing, congestion, runny nose or sore throat.  SKIN: No rash or itching.  CARDIOVASCULAR: per hpi RESPIRATORY: No shortness of breath, cough or sputum.  GASTROINTESTINAL: No anorexia, nausea, vomiting or diarrhea. No abdominal pain or blood.  GENITOURINARY: No burning on urination, no polyuria NEUROLOGICAL: No headache, dizziness, syncope, paralysis, ataxia, numbness or tingling in the extremities. No change in bowel or bladder control.  MUSCULOSKELETAL: No muscle, back pain, joint pain or stiffness.  LYMPHATICS: No enlarged nodes. No history of splenectomy.  PSYCHIATRIC: No history of depression or anxiety.  ENDOCRINOLOGIC: No reports of sweating, cold or heat intolerance. No polyuria or polydipsia.  Marland Kitchen   Physical Examination Today's Vitals   07/09/20 1016  BP: 140/80  Pulse: 86  SpO2: 98%  Weight: 157 lb (71.2 kg)  Height: 5\' 5"  (1.651 m)   Body mass index is 26.13 kg/m.  Gen: resting comfortably, no acute distress HEENT: no scleral icterus, pupils equal round and reactive, no palptable cervical adenopathy,  CV: RRR, 2/6 systolic murmur rusb, no jvd Resp: Clear to auscultation bilaterally GI: abdomen is soft, non-tender, non-distended, normal bowel sounds, no hepatosplenomegaly MSK: extremities are warm, no edema.  Skin: warm, no rash Neuro:  no focal deficits Psych: appropriate affect   Diagnostic Studies 06/2006 cath FINDINGS: Aortic pressure 125/78 with a mean of 99. Left ventricular pressure 125/5 with an end-diastolic pressure of 10.  The left mainstem is angiographically normal. It bifurcates into the LAD and left circumflex.  The LAD is a large-caliber vessel that courses down to the LV apex. It gives off a first diagonal Trayton Szabo and is a large vessel. There is no significant disease in the LAD or diagonal system.  Left circumflex is a  large-caliber vessel. There is a small intermediate Nzinga Ferran  present. There are really no substantial obtuse marginal branches from the left circumflex present. The left circumflex courses down the AV groove and gives off a large posterolateral Lenton Gendreau. The mid portion of the left circumflex has a long segment of 30-40% stenosis.  The right coronary artery is a large-caliber vessel. It terminates distally in a PDA and posterior AV segment which gives off two posterolateral branches. The mid portion of the right coronary artery provides a small RV marginal Marceil Welp, and the proximal portion has a small conus Elbia Paro present. There are minor luminal irregularities in the proximal portion of the right coronary artery, but there is no significant angiographic disease.  Left ventriculography performed in the 30-degree RAO projection demonstrates normal left ventricular systolic function with an LVEF of 55%. There is no mitral regurgitation.  ASSESSMENT: 1. Nonobstructive coronary artery disease, predominantly involving the left circumflex system. 2. Normal left ventricular function. 3. Intracoronary vascular ultrasound demonstration of minimal plaque in the right coronary artery.  PLAN: Ms. Smuck will require aggressive medical therapy. She needs tobacco cessation and progressive statin therapy. She will receive her statin therapy through the [facility] protocol. She should be on a daily aspirin as well. She will be a candidate for early discharge as her chest pain appears to have been noncardiac, and she has had no further pain.   Jan 2020 nuclear stress  There was no ST segment deviation noted during stress.  Defect 1: There is a medium defect of mild severity present in the basal inferior, mid inferior and apical inferior location. This is likely due to soft tissue attenuation. No evidence of ischemia.  This is a low risk study.  Nuclear stress EF: 58%.  01/2020 echo IMPRESSIONS    1.  Left ventricular ejection fraction, by estimation, is 60 to 65%. The  left ventricle has normal function. The left ventricle has no regional  wall motion abnormalities. Left ventricular diastolic parameters are  consistent with Grade I diastolic  dysfunction (impaired relaxation).  2. Right ventricular systolic function is normal. The right ventricular  size is normal.  3. The mitral valve is normal in structure. No evidence of mitral valve  regurgitation. No evidence of mitral stenosis.  4. The aortic valve has an indeterminant number of cusps. There is mild  calcification of the aortic valve. There is mild thickening of the aortic  valve. Aortic valve regurgitation is mild. Mild aortic valve stenosis.  Mild aortic stenosis is present.  Aortic valve mean gradient measures 12.0 mmHg. Aortic valve peak gradient  measures 23.1 mmHg. Aortic valve area, by VTI measures 1.69 cm.  5. The inferior vena cava is normal in size with greater than 50%  respiratory variability, suggesting right atrial pressure of 3 mmHg.    Assessment and Plan  1. CAD/Atypical chest pain - long history of atypical chest pain - nonobstructive CAD by cath 2008, negative nuclear stress 2015and more recently Jan 2020 -no recent cardiac symptoms, continue to monitor - EKG today shows NSR, no ischemic changes.   2.Aortic stenosis - mild by recent echo, repeat in 3 years or if new symptoms   3. HTN - manual recheck 134/75. At other provider visits has essentially been right at goal - continue current meds  4. Hyperlipidemia - at goal, continue statin     Arnoldo Lenis, M.D.

## 2020-07-09 NOTE — Patient Instructions (Signed)
Your physician recommends that you schedule a follow-up appointment in: 6 MONTHS WITH DR BRANCH  Your physician recommends that you continue on your current medications as directed. Please refer to the Current Medication list given to you today.  Thank you for choosing [facility]!!    

## 2020-07-11 ENCOUNTER — Telehealth: Payer: Self-pay | Admitting: Pulmonary Disease

## 2020-07-11 MED ORDER — PREDNISONE 10 MG PO TABS: ORAL_TABLET | ORAL | 0 refills | Status: DC

## 2020-07-11 MED ORDER — ALBUTEROL SULFATE HFA 108 (90 BASE) MCG/ACT IN AERS: 2.0000 | INHALATION_SPRAY | Freq: Four times a day (QID) | RESPIRATORY_TRACT | 5 refills | Status: DC | PRN

## 2020-07-11 NOTE — Telephone Encounter (Signed)
Called and spoke to pt. Pt c/o chest congestion, non prod cough, wheezing, increase in ShOB x 4 days. Pt denies f/c/s. Pt states she completed the abx and pred that was given on 4/20 and states she started to feel better but never returned to her baseline. Pt states she has been overusing her Spiriva and Symbicort. I stressed the importance of take her medications as directed and to not over use. Pt verbalized understanding. Pt does not have a current albuterol inhaler, this has been sent to her pharmacy. Pt states she has tried sudafed with no relief. Pt states she was told by the DME that she is not eligible for a new neb machine as insurance will only cover one every 5 years. Pt states she was told she can purchase a used one for $29, she will purchase this once she is able to get the money.   Dr. Vaughan Browner, please advise on pt's acute s/s. Thanks.

## 2020-07-11 NOTE — Telephone Encounter (Signed)
I don't think she will need additional antibiotics as there is no sputum or fevers  Please call in additional prednisone taper starting at 60 mg. Reduce dose by 10 mg every 2 days She needs to quit smoking

## 2020-07-11 NOTE — Telephone Encounter (Signed)
Called and spoke to pt. Informed her of the recs per Dr. Vaughan Browner. Rx sent to preferred pharmacy. Pt verbalized understanding and denied any further questions or concerns at this time.

## 2020-07-19 ENCOUNTER — Other Ambulatory Visit (HOSPITAL_COMMUNITY): Payer: Self-pay | Admitting: Psychiatry

## 2020-07-19 DIAGNOSIS — F332 Major depressive disorder, recurrent severe without psychotic features: Secondary | ICD-10-CM

## 2020-07-19 DIAGNOSIS — F411 Generalized anxiety disorder: Secondary | ICD-10-CM

## 2020-07-19 DIAGNOSIS — M6281 Muscle weakness (generalized): Secondary | ICD-10-CM | POA: Diagnosis not present

## 2020-08-01 ENCOUNTER — Other Ambulatory Visit: Payer: Self-pay

## 2020-08-01 ENCOUNTER — Encounter (HOSPITAL_COMMUNITY): Payer: Self-pay | Admitting: Psychiatry

## 2020-08-01 ENCOUNTER — Telehealth (INDEPENDENT_AMBULATORY_CARE_PROVIDER_SITE_OTHER): Payer: Medicare Other | Admitting: Psychiatry

## 2020-08-01 DIAGNOSIS — F332 Major depressive disorder, recurrent severe without psychotic features: Secondary | ICD-10-CM

## 2020-08-01 DIAGNOSIS — F411 Generalized anxiety disorder: Secondary | ICD-10-CM

## 2020-08-01 MED ORDER — DULOXETINE HCL 60 MG PO CPEP: 60.0000 mg | ORAL_CAPSULE | Freq: Two times a day (BID) | ORAL | 2 refills | Status: DC

## 2020-08-01 MED ORDER — CLONAZEPAM 1 MG PO TABS: 1.0000 mg | ORAL_TABLET | Freq: Three times a day (TID) | ORAL | 2 refills | Status: DC | PRN

## 2020-08-01 NOTE — Progress Notes (Signed)
Virtual Visit via Telephone Note  I connected with [name] on 08/01/20 at  2:00 PM EDT by telephone and verified that I am speaking with the correct person using two identifiers.  Location: Patient: home Provider: home office   I discussed the limitations, risks, security and privacy concerns of performing an evaluation and management service by telephone and the availability of in person appointments. I also discussed with the patient that there may be a patient responsible charge related to this service. The patient expressed understanding and agreed to proceed.      I discussed the assessment and treatment plan with the patient. The patient was provided an opportunity to ask questions and all were answered. The patient agreed with the plan and demonstrated an understanding of the instructions.   The patient was advised to call back or seek an in-person evaluation if the symptoms worsen or if the condition fails to improve as anticipated.  I provided 15 minutes of non-face-to-face time during this encounter.   [name], MD  [facility] MD/PA/NP OP Progress Note  08/01/2020 2:17 PM [name]  MRN:  [id number]  Chief Complaint:  Chief Complaint    Depression; Anxiety     HPI: This patient is a 65 year old female who is widowed and lives alone in [place].  She is on disability.  She returns after 3 months regarding depression and anxiety  Patient returns for follow-up after 3 months.  She states that she is still very stressed.  The man who owned her trailers is demanding money even though she claims she and her husband are taking him off before her husband's death.  She is in fear of being evicted.  She was able to get a lawyer through legal aid and has at least had this put off for 2 months.  She is constantly worrying about it.  However she denies severe depression or suicidal ideation feels that her medications are still helpful.  She is having a lot of trouble with her COPD  but has not yet quit smoking.   Visit Diagnosis:    ICD-10-CM   1. Severe episode of recurrent major depressive disorder, without psychotic features (HCC)  F33.2 DULoxetine (CYMBALTA) 60 MG capsule  2. GAD (generalized anxiety disorder)  F41.1 clonazePAM (KLONOPIN) 1 MG tablet    Past Psychiatric History: none  Past Medical History:  Past Medical History:  Diagnosis Date  . Allergy   . Arthritis   . Atypical mole 06/05/2003   slight-mod-Left scapula (WS)  . Atypical nevus 07/26/2003   persist. dysp- Left scapula (WS)  . Basal cell carcinoma 06/05/2003   RIght chest (CX35FU)  . Basal cell carcinoma 05/22/2004   superificial- RIght upper back- (CX35FU)  . Basal cell carcinoma 11/27/2004   beside R nostril-(MOHS), below right outer nose (MOHS)  . Basal cell carcinoma 11/08/2009   left post shoulder -(txpbx)  . Basal cell carcinoma 05/24/2012   ulcerated- Left post shoulder- (CX35FU), ulcerated-mid forehead (EXC )  . Basal cell carcinoma 06/24/2012   superificial- Right shoulder - (txpbx)   . Basal cell carcinoma 07/29/2012    sclerosis- mid forehead (MOHS)  . Basal cell carcinoma 06/06/2019   nod-right anterior neck (CX35FU)  . Cataract   . Constipation    uses OTC meds with help  . COPD (chronic obstructive pulmonary disease) ([place])   . Coronary artery disease    minimal nonobstructive   . COVID   . Depression   . GERD (gastroesophageal reflux  disease)   . Heart murmur   . Hx of adenomatous polyp of colon 10/03/2014  . Hypercholesteremia   . Hypertension   . Panic attacks   . Perimenopausal   . Skin cancer   . Squamous cell carcinoma of skin 06/05/2003   Right [place](CX35FU)  . Squamous cell carcinoma of skin 05/22/2004   in situ- Left upperarm (CX35FU)  . Squamous cell carcinoma of skin 11/27/2004   in situ- above Left elbow (CX35FU)  . Squamous cell carcinoma of skin 03/04/2006   in situ- Left sideburn (CX35FU)  . Squamous cell carcinoma of skin 11/26/2006    in situ- mid brow (Cx35FU)  . Squamous cell carcinoma of skin 05/24/2012   in situ- Left chest- (CX35FU)  . Squamous cell carcinoma of skin 04/05/2013   in situ- Left nose (Cx35FU)  . Squamous cell carcinoma of skin 08/17/2013   well diff-above Left eye (txpbx)  . Squamous cell carcinoma of skin 07/30/2016   in situ- right chest sup (Cx35FU)  . Squamous cell carcinoma of skin 05/12/2017   in situ- RIght shoulder (CX35FU), in situ- Left forehead (CX35FU)  . Squamous cell carcinoma of skin 11/23/2018   in situ- Right mid shin, inner- (MOHS)  . Tobacco user     Past Surgical History:  Procedure Laterality Date  . ABDOMINAL HYSTERECTOMY    . COLONOSCOPY    . Right foot toe surgery    . UPPER GASTROINTESTINAL ENDOSCOPY    . vocal cord     polyp removal    Family Psychiatric History: see below  Family History:  Family History  Problem Relation Age of Onset  . Heart disease Mother   . Heart attack Mother   . Heart attack Father   . Heart attack Brother   . Colon cancer Brother 5       died at age 13  . Anxiety disorder Brother   . Depression Brother   . Alcohol abuse Brother   . Colon polyps Brother   . Colon polyps Brother   . Colon polyps Sister   . Esophageal cancer Sister   . Anxiety disorder Sister   . Depression Sister   . Colon polyps Sister   . Drug abuse Son   . Rectal cancer Neg Hx   . Stomach cancer Neg Hx     Social History:  Social History   Socioeconomic History  . Marital status: Single    Spouse name: Not on file  . Number of children: 2  . Years of education: Not on file  . Highest education level: Not on file  Occupational History  . 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 Smoking status: Current Every Day Smoker    Packs/day: 1.00    Years: 40.00    Pack years: 40.00    Types: Cigarettes    Start date: 05/01/1969  . Smokeless tobacco: Never Used  Vaping Use  . Vaping Use: Never used  Substance and  Sexual Activity  . Alcohol use: No    Alcohol/week: 0.0 standard drinks  . Drug use: No    Comment: per pt, Cocaine was in her system October and November 2016 when she went to the pain clinic 04-02-15.  Marland Kitchen Sexual activity: Not on file  Other Topics Concern  . Not on file  Social History Narrative   Single   Smoker   No alcohol or drug use reported  Social Determinants of Health   Financial Resource Strain: Low Risk   . Difficulty of Paying Living Expenses: Not very hard  Food Insecurity: Not on file  Transportation Needs: No Transportation Needs  . Lack of Transportation (Medical): No  . Lack of Transportation (Non-Medical): No  Physical Activity: Not on file  Stress: Not on file  Social Connections: Not on file    Allergies:  Allergies  Allergen Reactions  . Lorcet 10-650 [Hydrocodone-Acetaminophen] Itching  . Sulfa Antibiotics Other (See Comments)    aching  . Tape Other (See Comments)    Please use paper tape    Metabolic Disorder Labs: Lab Results  Component Value Date   HGBA1C 5.9 (H) 07/12/2013   MPG 123 (H) 07/12/2013   No results found for: PROLACTIN Lab Results  Component Value Date   CHOL 183 02/28/2020   TRIG 116 02/28/2020   HDL 53 02/28/2020   CHOLHDL 3.5 02/28/2020   VLDL 36 07/13/2013   LDLCALC 109 (H) 02/28/2020   LDLCALC 105 (H) 08/22/2019   Lab Results  Component Value Date   TSH 1.820 05/25/2017   TSH 1.080 05/17/2015    Therapeutic Level Labs: No results found for: LITHIUM No results found for: VALPROATE No components found for:  CBMZ  Current Medications: Current Outpatient Medications  Medication Sig Dispense Refill  . albuterol (VENTOLIN HFA) 108 (90 Base) MCG/ACT inhaler Inhale 2 puffs into the lungs every 6 (six) hours as needed for wheezing or shortness of breath. 8 g 5  . amLODipine (NORVASC) 10 MG tablet TAKE ONE TABLET BY MOUTH DAILY 90 tablet 1  . Ascorbic Acid (VITAMIN C) 1000 MG tablet Take 1,000 mg by mouth  daily.    Marland Kitchen aspirin EC 81 MG tablet Take 81 mg by mouth daily. (Patient not taking: Reported on 07/09/2020)    . atorvastatin (LIPITOR) 40 MG tablet TAKE 1 TABLET BY MOUTH AT  BEDTIME 90 tablet 1  . budesonide-formoterol (SYMBICORT) 160-4.5 MCG/ACT inhaler INHALE TWO PUFFS INTO THE LUNGS BY MOUTH TWICE DAILY 10.2 g 5  . cholecalciferol (VITAMIN D3) 25 MCG (1000 UNIT) tablet Take 1,000 Units by mouth daily.    . clonazePAM (KLONOPIN) 1 MG tablet Take 1 tablet (1 mg total) by mouth 3 (three) times daily as needed. for anxiety 90 tablet 2  . cloNIDine (CATAPRES) 0.1 MG tablet Take 1 tablet (0.1 mg total) by mouth at bedtime. 90 tablet 1  . dexlansoprazole (DEXILANT) 60 MG capsule TAKE ONE CAPSULE BY MOUTH DAILY ON EMPTY STOMACH, THEN DO NOT EAT FOR ONE HOUR. (REPLACES PANTROPRAZOLE FOR REFLUX) 90 capsule 1  . DULoxetine (CYMBALTA) 60 MG capsule Take 1 capsule (60 mg total) by mouth 2 (two) times daily. 60 capsule 2  . fexofenadine (ALLEGRA) 180 MG tablet Take 1 tablet (180 mg total) by mouth daily. For allergy symptoms 30 tablet 11  . fish oil-omega-3 fatty acids 1000 MG capsule Take 1 g by mouth daily.     Marland Kitchen gabapentin (NEURONTIN) 300 MG capsule Take 300 mg by mouth 3 (three) times daily.    Marland Kitchen ipratropium-albuterol (DUONEB) 0.5-2.5 (3) MG/3ML SOLN Take 3 mLs by nebulization every 6 (six) hours as needed. 360 mL 5  . lisinopril (ZESTRIL) 20 MG tablet TAKE 1 TABLET BY MOUTH  DAILY 90 tablet 1  . meloxicam (MOBIC) 15 MG tablet Take 15 mg by mouth daily.    . nicotine (NICODERM CQ - DOSED IN MG/24 HOURS) 21 mg/24hr patch Place 1 patch (21 mg total)  onto the skin daily. 28 patch 0  . oxyCODONE (ROXICODONE) 15 MG immediate release tablet Take 15 mg by mouth 4 (four) times daily as needed.    . pantoprazole (PROTONIX) 40 MG tablet TAKE 1 TABLET BY MOUTH  TWICE DAILY 180 tablet 1  . predniSONE (DELTASONE) 10 MG tablet Take 60mg  x 2 days, then 50mg  x 2 days, then 40mg  x 2 days, then 30mg  x 2 days, then 20mg  x 2  days, then 10mg  x 2 days, then stop. 42 tablet 0  . sucralfate (CARAFATE) 1 g tablet TAKE ONE TABLET BY MOUTH TWICE DAILY 60 tablet 2  . Tiotropium Bromide Monohydrate (SPIRIVA RESPIMAT) 2.5 MCG/ACT AERS INHALE TWO PUFFS INTO THE LUNGS DAILY 4 g 5  . varenicline (CHANTIX STARTING MONTH PAK) 0.5 MG X 11 & 1 MG X 42 tablet Take 1 0.5 mg tablet once daily for 3 days, increase to 1 0.5 mg tablet twice daily for 4 days, increase to 1 1 mg tablet twice daily. (Patient not taking: Reported on 07/09/2020) 53 tablet 0   No current facility-administered medications for this visit.     Musculoskeletal: Strength & Muscle Tone: within normal limits Gait & Station: normal Patient leans: N/A  Psychiatric Specialty Exam: Review of Systems  Respiratory: Positive for shortness of breath.   Psychiatric/Behavioral: The patient is nervous/anxious.   All other systems reviewed and are negative.   There were no vitals taken for this visit.There is no height or weight on file to calculate BMI.  General Appearance: NA  Eye Contact:  NA  Speech:  Clear and Coherent  Volume:  Normal  Mood:  Anxious  Affect:  NA  Thought Process:  Goal Directed  Orientation:  Full (Time, Place, and Person)  Thought Content: Rumination   Suicidal Thoughts:  No  Homicidal Thoughts:  No  Memory:  Immediate;   Good Recent;   Good Remote;   Good  Judgement:  Good  Insight:  Fair  Psychomotor Activity:  Decreased  Concentration:  Concentration: Good and Attention Span: Good  Recall:  Good  Fund of Knowledge: Good  Language: Good  Akathisia:  No  Handed:  Right  AIMS (if indicated): not done  Assets:  Communication Skills Desire for Improvement Resilience Social Support Talents/Skills  ADL's:  Intact  Cognition: WNL  Sleep:  Fair   Screenings: GAD-7   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  She thinks she is stressed regarding these financial and legal issues but that her medications are still helping.  She will continue Cymbalta 60 mg twice daily for depression and clonazepam 1 mg 3 times daily for anxiety.  She will return to see me in 3 months  [name], MD 08/01/2020, 2:17 PM

## 2020-08-11 ENCOUNTER — Other Ambulatory Visit: Payer: Self-pay | Admitting: Family Medicine

## 2020-08-15 ENCOUNTER — Other Ambulatory Visit: Payer: Self-pay | Admitting: Family Medicine

## 2020-08-15 DIAGNOSIS — J439 Emphysema, unspecified: Secondary | ICD-10-CM

## 2020-08-18 DIAGNOSIS — M6281 Muscle weakness (generalized): Secondary | ICD-10-CM | POA: Diagnosis not present

## 2020-08-28 ENCOUNTER — Encounter: Payer: Self-pay | Admitting: Family Medicine

## 2020-08-28 ENCOUNTER — Other Ambulatory Visit: Payer: Self-pay

## 2020-08-28 ENCOUNTER — Ambulatory Visit (INDEPENDENT_AMBULATORY_CARE_PROVIDER_SITE_OTHER): Payer: Medicare Other | Admitting: Family Medicine

## 2020-08-28 VITALS — BP 118/70 | HR 82 | Temp 96.6°F | Ht 65.0 in | Wt 159.2 lb

## 2020-08-28 DIAGNOSIS — E559 Vitamin D deficiency, unspecified: Secondary | ICD-10-CM | POA: Diagnosis not present

## 2020-08-28 DIAGNOSIS — I10 Essential (primary) hypertension: Secondary | ICD-10-CM | POA: Diagnosis not present

## 2020-08-28 DIAGNOSIS — Z23 Encounter for immunization: Secondary | ICD-10-CM | POA: Diagnosis not present

## 2020-08-28 DIAGNOSIS — E782 Mixed hyperlipidemia: Secondary | ICD-10-CM | POA: Diagnosis not present

## 2020-08-28 MED ORDER — BUPROPION HCL ER (XL) 300 MG PO TB24: 300.0000 mg | ORAL_TABLET | Freq: Every day | ORAL | 1 refills | Status: DC

## 2020-08-28 MED ORDER — LISINOPRIL 20 MG PO TABS: 20.0000 mg | ORAL_TABLET | Freq: Every day | ORAL | 1 refills | Status: DC

## 2020-08-28 MED ORDER — PANTOPRAZOLE SODIUM 40 MG PO TBEC: 40.0000 mg | DELAYED_RELEASE_TABLET | Freq: Two times a day (BID) | ORAL | 1 refills | Status: DC

## 2020-08-28 MED ORDER — NALOXONE HCL 4 MG/0.1ML NA LIQD: 1.0000 | Freq: Once | NASAL | 5 refills | Status: AC

## 2020-08-28 MED ORDER — ATORVASTATIN CALCIUM 40 MG PO TABS: 40.0000 mg | ORAL_TABLET | Freq: Every day | ORAL | 1 refills | Status: DC

## 2020-08-28 NOTE — Progress Notes (Signed)
Subjective:  Patient ID: [name], female    DOB: 03/29/1955  Age: 65 y.o. MRN: [id number]  CC: Medical Management of Chronic Issues   HPI [name] presents for depression. Problem with the trailer she is buying. Afraid she will not have a place to live. Pt. Relates that she didn't really look at the [facility] closely, but she is not going to hurt herself. Seeing psychiatry, Dr. Harrington Challenger. Has been taking cymbalta.  She is also taking clonazepam.  Her psychiatrist recently increased her dose as result of her circumstances that have increased her anxiety and depression.  Patient also expresses that she is using t Spiriva and Symbicort regularly for her COPD.  She uses the Ventolin  She is using it occasionally.  Patient in for follow-up of GERD. Currently asymptomatic taking  PPI daily. There is no chest pain.  She does have occasional heartburn.  This is relieved with over-the-counter remedies.  No hematemesis and no melena. No dysphagia or choking. Onset is remote. Progression is stable. Complicating factors, none.  Patient also is seeing pain management from she is prescribed oxycodone 15 mg 4 times daily for chronic back pain.  She understands that the clonazepam combined with the oxycodone increases her risk for overdose.    Depression screen [facility] 2/9 08/28/2020 08/28/2020 04/02/2020 02/28/2020 08/22/2019  Decreased Interest 3 0 2 2 0  Down, Depressed, Hopeless 2 0 _0 PHQ - 2 Score 5 0 _1 Altered sleeping 2 - 1 1 -  Tired, decreased energy 2 - 2 2 -  Change in appetite 2 - 1 1 -  Feeling bad or failure about yourself  1 - 0 0 -  Trouble concentrating 2 - 2 2 -  Moving slowly or fidgety/restless 0 - 0 0 -  Suicidal thoughts 0 - 0 0 -  PHQ-9 Score 14 - 10 10 -  Difficult doing work/chores Somewhat difficult - Somewhat difficult Somewhat difficult -  Some encounter information is confidential and restricted. Go to Review Flowsheets activity to see all data.  Some recent data  might be hidden       follow-up of hypertension. Patient has no history of headache chest pain or shortness of breath or recent cough. Patient also denies symptoms of TIA such as focal numbness or weakness. Patient denies side effects from medication. States taking it regularly.   History Miyoko has a past medical history of Allergy, Arthritis, Atypical mole (06/05/2003), Atypical nevus (07/26/2003), Basal cell carcinoma (06/05/2003), Basal cell carcinoma (05/22/2004), Basal cell carcinoma (11/27/2004), Basal cell carcinoma (11/08/2009), Basal cell carcinoma (05/24/2012), Basal cell carcinoma (06/24/2012), Basal cell carcinoma (07/29/2012), Basal cell carcinoma (06/06/2019), Cataract, Constipation, COPD (chronic obstructive pulmonary disease) ([place]), Coronary artery disease, COVID, Depression, GERD (gastroesophageal reflux disease), Heart murmur, adenomatous polyp of colon (10/03/2014), Hypercholesteremia, Hypertension, Panic attacks, Perimenopausal, Skin cancer, Squamous cell carcinoma of skin (06/05/2003), Squamous cell carcinoma of skin (05/22/2004), Squamous cell carcinoma of skin (11/27/2004), Squamous cell carcinoma of skin (03/04/2006), Squamous cell carcinoma of skin (11/26/2006), Squamous cell carcinoma of skin (05/24/2012), Squamous cell carcinoma of skin (04/05/2013), Squamous cell carcinoma of skin (08/17/2013), Squamous cell carcinoma of skin (07/30/2016), Squamous cell carcinoma of skin (05/12/2017), Squamous cell carcinoma of skin (11/23/2018), and Tobacco user.   She has a past surgical history that includes Abdominal hysterectomy; vocal cord; Right foot toe surgery; Colonoscopy; and Upper gastrointestinal endoscopy.   Her family history includes Alcohol abuse in her brother; Anxiety disorder in her  brother and sister; Colon cancer (age of onset: 53) in her brother; Colon polyps in her brother, brother, sister, and sister; Depression in her brother and sister; Drug abuse in her son;  Esophageal cancer in her sister; Heart attack in her brother, father, and mother; Heart disease in her mother.She reports that she has been smoking cigarettes. She started smoking about 51 years ago. She has a 40.00 pack-year smoking history. She has never used smokeless tobacco. She reports that she does not drink alcohol and does not use drugs.  Current Outpatient Medications on File Prior to Visit  Medication Sig Dispense Refill   albuterol (VENTOLIN HFA) 108 (90 Base) MCG/ACT inhaler Inhale 2 puffs into the lungs every 6 (six) hours as needed for wheezing or shortness of breath. 8 g 5   amLODipine (NORVASC) 10 MG tablet TAKE ONE TABLET BY MOUTH DAILY 90 tablet 1   Ascorbic Acid (VITAMIN C) 1000 MG tablet Take 1,000 mg by mouth daily.     aspirin EC 81 MG tablet Take 81 mg by mouth daily.     budesonide-formoterol (SYMBICORT) 160-4.5 MCG/ACT inhaler INHALE TWO PUFFS INTO THE LUNGS BY MOUTH TWICE DAILY 10.2 g 0   cholecalciferol (VITAMIN D3) 25 MCG (1000 UNIT) tablet Take 1,000 Units by mouth daily.     clonazePAM (KLONOPIN) 1 MG tablet Take 1 tablet (1 mg total) by mouth 3 (three) times daily as needed. for anxiety 90 tablet 2   cloNIDine (CATAPRES) 0.1 MG tablet TAKE ONE TABLET BY MOUTH AT BEDTIME 90 tablet 0   dexlansoprazole (DEXILANT) 60 MG capsule TAKE ONE CAPSULE BY MOUTH DAILY ON EMPTY STOMACH, THEN DO NOT EAT FOR ONE HOUR. (REPLACES PANTROPRAZOLE FOR REFLUX) 90 capsule 0   DULoxetine (CYMBALTA) 60 MG capsule Take 1 capsule (60 mg total) by mouth 2 (two) times daily. 60 capsule 2   fexofenadine (ALLEGRA) 180 MG tablet Take 1 tablet (180 mg total) by mouth daily. For allergy symptoms 30 tablet 11   fish oil-omega-3 fatty acids 1000 MG capsule Take 1 g by mouth daily.      gabapentin (NEURONTIN) 300 MG capsule Take 300 mg by mouth 3 (three) times daily.     ipratropium-albuterol (DUONEB) 0.5-2.5 (3) MG/3ML SOLN Take 3 mLs by nebulization every 6 (six) hours as needed. 360 mL 5   meloxicam  (MOBIC) 15 MG tablet Take 15 mg by mouth daily.     nicotine (NICODERM CQ - DOSED IN MG/24 HOURS) 21 mg/24hr patch Place 1 patch (21 mg total) onto the skin daily. 28 patch 0   oxyCODONE (ROXICODONE) 15 MG immediate release tablet Take 15 mg by mouth 4 (four) times daily as needed.     predniSONE (DELTASONE) 10 MG tablet Take 36m x 2 days, then 595mx 2 days, then 4057m 2 days, then 38m58m2 days, then 20mg68m days, then 10mg 99mdays, then stop. 42 tablet 0   SPIRIVA RESPIMAT 2.5 MCG/ACT AERS INHALE TWO PUFFS INTO THE LUNGS DAILY 4 g 0   sucralfate (CARAFATE) 1 g tablet TAKE ONE TABLET BY MOUTH TWICE DAILY 60 tablet 0   varenicline (CHANTIX STARTING MONTH PAK) 0.5 MG X 11 & 1 MG X 42 tablet Take 1 0.5 mg tablet once daily for 3 days, increase to 1 0.5 mg tablet twice daily for 4 days, increase to 1 1 mg tablet twice daily. 53 tablet 0   No current facility-administered medications on file prior to visit.  ROS Review of Systems  Constitutional: Negative.   HENT: Negative.    Eyes:  Negative for visual disturbance.  Respiratory:  Positive for shortness of breath (with activities like sweeping , mopping). Negative for wheezing.   Cardiovascular:  Negative for chest pain.  Gastrointestinal:  Negative for abdominal pain.  Musculoskeletal:  Negative for arthralgias.   Objective:  BP 118/70   Pulse 82   Temp (!) 96.6 F (35.9 C)   Ht _0  (1.651 m)   Wt 159 lb 3.2 oz (72.2 kg)   BMI 26.49 kg/m   BP Readings from Last 3 Encounters:  08/28/20 118/70  07/09/20 140/80  06/20/20 132/80    Wt Readings from Last 3 Encounters:  08/28/20 159 lb 3.2 oz (72.2 kg)  07/09/20 157 lb (71.2 kg)  06/20/20 158 lb (71.7 kg)     Physical Exam Constitutional:      General: She is not in acute distress.    Appearance: She is well-developed. She is ill-appearing.  HENT:     Head: Normocephalic and atraumatic.  Eyes:     Conjunctiva/sclera: Conjunctivae normal.     Pupils: Pupils are  equal, round, and reactive to light.  Neck:     Thyroid: No thyromegaly.  Cardiovascular:     Rate and Rhythm: Normal rate and regular rhythm.     Heart sounds: Normal heart sounds. No murmur heard. Pulmonary:     Effort: Pulmonary effort is normal. No respiratory distress.     Breath sounds: Normal breath sounds. No wheezing or rales.  Abdominal:     General: Bowel sounds are normal. There is no distension.     Palpations: Abdomen is soft.     Tenderness: There is no abdominal tenderness.  Musculoskeletal:        General: Normal range of motion.     Cervical back: Normal range of motion and neck supple.  Lymphadenopathy:     Cervical: No cervical adenopathy.  Skin:    General: Skin is warm and dry.  Neurological:     Mental Status: She is alert and oriented to person, place, and time.  Psychiatric:        Attention and Perception: Attention normal.        Mood and Affect: Mood is depressed. Affect is blunt.        Speech: Speech is delayed.        Behavior: Behavior normal. Behavior is cooperative.        Thought Content: Thought content normal.        Cognition and Memory: Cognition normal.        Judgment: Judgment normal.      Assessment & Plan:   Mykela was seen today for medical management of chronic issues.  Diagnoses and all orders for this visit:  Vitamin D deficiency -     VITAMIN D 25 Hydroxy (Vit-D Deficiency, Fractures)  Mixed hyperlipidemia -     atorvastatin (LIPITOR) 40 MG tablet; Take 1 tablet (40 mg total) by mouth at bedtime. -     Lipid panel  Essential hypertension -     lisinopril (ZESTRIL) 20 MG tablet; Take 1 tablet (20 mg total) by mouth daily. -     CBC with Differential/Platelet -     CMP14+EGFR  Need for vaccination against Streptococcus pneumoniae -     Pneumococcal polysaccharide vaccine 23-valent greater than or equal to 2yo subcutaneous/IM  Need for shingles vaccine -     Varicella-zoster vaccine IM (Shingrix)  Other orders -      pantoprazole (PROTONIX) 40 MG tablet; Take 1 tablet (40 mg total) by mouth 2 (two) times daily. -     naloxone (NARCAN) nasal spray 4 mg/0.1 mL; Place 1 spray into the nose once for 1 dose. -     buPROPion (WELLBUTRIN XL) 300 MG 24 hr tablet; Take 1 tablet (300 mg total) by mouth daily.  Allergies as of 08/28/2020       Reactions   Lorcet 10-650 [hydrocodone-acetaminophen] Itching   Sulfa Antibiotics Other (See Comments)   aching   Tape Other (See Comments)   Please use paper tape        Medication List        Accurate as of August 28, 2020 10:26 PM. If you have any questions, ask your nurse or doctor.          albuterol 108 (90 Base) MCG/ACT inhaler Commonly known as: VENTOLIN HFA Inhale 2 puffs into the lungs every 6 (six) hours as needed for wheezing or shortness of breath.   amLODipine 10 MG tablet Commonly known as: NORVASC TAKE ONE TABLET BY MOUTH DAILY   aspirin EC 81 MG tablet Take 81 mg by mouth daily.   atorvastatin 40 MG tablet Commonly known as: LIPITOR Take 1 tablet (40 mg total) by mouth at bedtime.   budesonide-formoterol 160-4.5 MCG/ACT inhaler Commonly known as: Symbicort INHALE TWO PUFFS INTO THE LUNGS BY MOUTH TWICE DAILY   buPROPion 300 MG 24 hr tablet Commonly known as: Wellbutrin XL Take 1 tablet (300 mg total) by mouth daily. Started by: Claretta Fraise, MD   Chantix Starting Month Pak 0.5 MG X 11 & 1 MG X 42 tablet Generic drug: varenicline Take 1 0.5 mg tablet once daily for 3 days, increase to 1 0.5 mg tablet twice daily for 4 days, increase to 1 1 mg tablet twice daily.   cholecalciferol 25 MCG (1000 UNIT) tablet Commonly known as: VITAMIN D3 Take 1,000 Units by mouth daily.   clonazePAM 1 MG tablet Commonly known as: KLONOPIN Take 1 tablet (1 mg total) by mouth 3 (three) times daily as needed. for anxiety   cloNIDine 0.1 MG tablet Commonly known as: CATAPRES TAKE ONE TABLET BY MOUTH AT BEDTIME   dexlansoprazole 60 MG  capsule Commonly known as: DEXILANT TAKE ONE CAPSULE BY MOUTH DAILY ON EMPTY STOMACH, THEN DO NOT EAT FOR ONE HOUR. (REPLACES PANTROPRAZOLE FOR REFLUX)   DULoxetine 60 MG capsule Commonly known as: CYMBALTA Take 1 capsule (60 mg total) by mouth 2 (two) times daily.   fexofenadine 180 MG tablet Commonly known as: ALLEGRA Take 1 tablet (180 mg total) by mouth daily. For allergy symptoms   fish oil-omega-3 fatty acids 1000 MG capsule Take 1 g by mouth daily.   gabapentin 300 MG capsule Commonly known as: NEURONTIN Take 300 mg by mouth 3 (three) times daily.   ipratropium-albuterol 0.5-2.5 (3) MG/3ML Soln Commonly known as: DUONEB Take 3 mLs by nebulization every 6 (six) hours as needed.   lisinopril 20 MG tablet Commonly known as: ZESTRIL Take 1 tablet (20 mg total) by mouth daily.   meloxicam 15 MG tablet Commonly known as: MOBIC Take 15 mg by mouth daily.   naloxone 4 MG/0.1ML Liqd nasal spray kit Commonly known as: NARCAN Place 1 spray into the nose once for 1 dose. Started by: Claretta Fraise, MD   nicotine 21 mg/24hr patch Commonly known as: NICODERM CQ - dosed in mg/24 hours Place 1 patch (  21 mg total) onto the skin daily.   oxyCODONE 15 MG immediate release tablet Commonly known as: ROXICODONE Take 15 mg by mouth 4 (four) times daily as needed.   pantoprazole 40 MG tablet Commonly known as: PROTONIX Take 1 tablet (40 mg total) by mouth 2 (two) times daily.   predniSONE 10 MG tablet Commonly known as: DELTASONE Take 71m x 2 days, then 524mx 2 days, then 40105m 2 days, then 47m19m2 days, then 20mg103m days, then 10mg 103mdays, then stop.   Spiriva Respimat 2.5 MCG/ACT Aers Generic drug: Tiotropium Bromide Monohydrate INHALE TWO PUFFS INTO THE LUNGS DAILY   sucralfate 1 g tablet Commonly known as: CARAFATE TAKE ONE TABLET BY MOUTH TWICE DAILY   vitamin C 1000 MG tablet Take 1,000 mg by mouth daily.        Meds ordered this encounter  Medications    atorvastatin (LIPITOR) 40 MG tablet    Sig: Take 1 tablet (40 mg total) by mouth at bedtime.    Dispense:  90 tablet    Refill:  1   lisinopril (ZESTRIL) 20 MG tablet    Sig: Take 1 tablet (20 mg total) by mouth daily.    Dispense:  90 tablet    Refill:  1   pantoprazole (PROTONIX) 40 MG tablet    Sig: Take 1 tablet (40 mg total) by mouth 2 (two) times daily.    Dispense:  180 tablet    Refill:  1   naloxone (NARCAN) nasal spray 4 mg/0.1 mL    Sig: Place 1 spray into the nose once for 1 dose.    Dispense:  2 each    Refill:  5   buPROPion (WELLBUTRIN XL) 300 MG 24 hr tablet    Sig: Take 1 tablet (300 mg total) by mouth daily.    Dispense:  90 tablet    Refill:  1    I encouraged the patient to seek early follow-up with her psychiatrist.  Follow-up: Return in about 6 months (around 02/27/2021), or if symptoms worsen or fail to improve.  WarrenClaretta Fraise

## 2020-08-29 LAB — CMP14+EGFR
ALT: 12 IU/L (ref 0–32)
AST: 9 IU/L (ref 0–40)
Albumin/Globulin Ratio: 1.9 (ref 1.2–2.2)
Albumin: 4.2 g/dL (ref 3.8–4.8)
Alkaline Phosphatase: 123 IU/L — ABNORMAL HIGH (ref 44–121)
BUN/Creatinine Ratio: 17 (ref 12–28)
BUN: 12 mg/dL (ref 8–27)
Bilirubin Total: 0.2 mg/dL (ref 0.0–1.2)
CO2: 25 mmol/L (ref 20–29)
Calcium: 9.5 mg/dL (ref 8.7–10.3)
Chloride: 101 mmol/L (ref 96–106)
Creatinine, Ser: 0.72 mg/dL (ref 0.57–1.00)
Globulin, Total: 2.2 g/dL (ref 1.5–4.5)
Glucose: 99 mg/dL (ref 65–99)
Potassium: 4.5 mmol/L (ref 3.5–5.2)
Sodium: 141 mmol/L (ref 134–144)
Total Protein: 6.4 g/dL (ref 6.0–8.5)
eGFR: 92 mL/min/{1.73_m2} (ref >59)

## 2020-08-29 LAB — LIPID PANEL
Chol/HDL Ratio: 3.4 ratio (ref 0.0–4.4)
Cholesterol, Total: 172 mg/dL (ref 100–199)
HDL: 50 mg/dL (ref >39)
LDL Chol Calc (NIH): 96 mg/dL (ref 0–99)
Triglycerides: 152 mg/dL — ABNORMAL HIGH (ref 0–149)
VLDL Cholesterol Cal: 26 mg/dL (ref 5–40)

## 2020-08-29 LAB — CBC WITH DIFFERENTIAL/PLATELET
Basophils Absolute: 0.1 10*3/uL (ref 0.0–0.2)
Basos: 1 %
EOS (ABSOLUTE): 0.1 10*3/uL (ref 0.0–0.4)
Eos: 1 %
Hematocrit: 42.6 % (ref 34.0–46.6)
Hemoglobin: 14.3 g/dL (ref 11.1–15.9)
Immature Grans (Abs): 0.1 10*3/uL (ref 0.0–0.1)
Immature Granulocytes: 1 %
Lymphocytes Absolute: 2.2 10*3/uL (ref 0.7–3.1)
Lymphs: 20 %
MCH: 30.8 pg (ref 26.6–33.0)
MCHC: 33.6 g/dL (ref 31.5–35.7)
MCV: 92 fL (ref 79–97)
Monocytes Absolute: 0.9 10*3/uL (ref 0.1–0.9)
Monocytes: 8 %
Neutrophils Absolute: 7.7 10*3/uL — ABNORMAL HIGH (ref 1.4–7.0)
Neutrophils: 69 %
Platelets: 378 10*3/uL (ref 150–450)
RBC: 4.64 x10E6/uL (ref 3.77–5.28)
RDW: 13.9 % (ref 11.7–15.4)
WBC: 10.9 10*3/uL — ABNORMAL HIGH (ref 3.4–10.8)

## 2020-08-29 LAB — VITAMIN D 25 HYDROXY (VIT D DEFICIENCY, FRACTURES): Vit D, 25-Hydroxy: 23.4 ng/mL — ABNORMAL LOW (ref 30.0–100.0)

## 2020-08-29 NOTE — Progress Notes (Signed)
Dear [name], Your Vitamin D is  low. You need a prescription strength supplement I will send that in for you. Nurse, if at all possible, could you send in a prescription for the patient for vitamin D 50,000 units, 1 p.o. weekly #13 with 3 refills? Many thanks, WS

## 2020-08-30 DIAGNOSIS — M545 Low back pain, unspecified: Secondary | ICD-10-CM | POA: Diagnosis not present

## 2020-08-30 DIAGNOSIS — Z79899 Other long term (current) drug therapy: Secondary | ICD-10-CM | POA: Diagnosis not present

## 2020-08-30 DIAGNOSIS — G5603 Carpal tunnel syndrome, bilateral upper limbs: Secondary | ICD-10-CM | POA: Diagnosis not present

## 2020-08-30 DIAGNOSIS — R202 Paresthesia of skin: Secondary | ICD-10-CM | POA: Diagnosis not present

## 2020-08-30 DIAGNOSIS — G43909 Migraine, unspecified, not intractable, without status migrainosus: Secondary | ICD-10-CM | POA: Diagnosis not present

## 2020-08-30 DIAGNOSIS — M542 Cervicalgia: Secondary | ICD-10-CM | POA: Diagnosis not present

## 2020-08-31 ENCOUNTER — Other Ambulatory Visit: Payer: Self-pay | Admitting: *Deleted

## 2020-08-31 MED ORDER — VITAMIN D (ERGOCALCIFEROL) 1.25 MG (50000 UNIT) PO CAPS: 50000.0000 [IU] | ORAL_CAPSULE | ORAL | 3 refills | Status: DC

## 2020-09-04 ENCOUNTER — Other Ambulatory Visit: Payer: Self-pay | Admitting: Family Medicine

## 2020-09-04 DIAGNOSIS — I10 Essential (primary) hypertension: Secondary | ICD-10-CM

## 2020-09-04 DIAGNOSIS — E782 Mixed hyperlipidemia: Secondary | ICD-10-CM

## 2020-09-17 DIAGNOSIS — M6281 Muscle weakness (generalized): Secondary | ICD-10-CM | POA: Diagnosis not present

## 2020-09-27 ENCOUNTER — Other Ambulatory Visit: Payer: Self-pay

## 2020-09-27 ENCOUNTER — Encounter: Payer: Self-pay | Admitting: Dermatology

## 2020-09-27 ENCOUNTER — Ambulatory Visit (INDEPENDENT_AMBULATORY_CARE_PROVIDER_SITE_OTHER): Payer: Medicare Other | Admitting: Dermatology

## 2020-09-27 DIAGNOSIS — D0439 Carcinoma in situ of skin of other parts of face: Secondary | ICD-10-CM

## 2020-09-27 DIAGNOSIS — L570 Actinic keratosis: Secondary | ICD-10-CM

## 2020-09-27 DIAGNOSIS — Z85828 Personal history of other malignant neoplasm of skin: Secondary | ICD-10-CM

## 2020-09-27 DIAGNOSIS — C4441 Basal cell carcinoma of skin of scalp and neck: Secondary | ICD-10-CM

## 2020-09-27 DIAGNOSIS — D485 Neoplasm of uncertain behavior of skin: Secondary | ICD-10-CM

## 2020-09-27 NOTE — Patient Instructions (Signed)

## 2020-09-28 DIAGNOSIS — I739 Peripheral vascular disease, unspecified: Secondary | ICD-10-CM | POA: Diagnosis not present

## 2020-09-28 DIAGNOSIS — S40812A Abrasion of left upper arm, initial encounter: Secondary | ICD-10-CM | POA: Diagnosis not present

## 2020-09-28 DIAGNOSIS — S30811A Abrasion of abdominal wall, initial encounter: Secondary | ICD-10-CM | POA: Diagnosis not present

## 2020-09-28 DIAGNOSIS — Z882 Allergy status to sulfonamides status: Secondary | ICD-10-CM | POA: Diagnosis not present

## 2020-09-28 DIAGNOSIS — M79662 Pain in left lower leg: Secondary | ICD-10-CM | POA: Diagnosis not present

## 2020-09-28 DIAGNOSIS — J441 Chronic obstructive pulmonary disease with (acute) exacerbation: Secondary | ICD-10-CM | POA: Diagnosis not present

## 2020-09-28 DIAGNOSIS — S8012XA Contusion of left lower leg, initial encounter: Secondary | ICD-10-CM | POA: Diagnosis not present

## 2020-09-28 DIAGNOSIS — S8011XA Contusion of right lower leg, initial encounter: Secondary | ICD-10-CM | POA: Diagnosis not present

## 2020-09-28 DIAGNOSIS — M79661 Pain in right lower leg: Secondary | ICD-10-CM | POA: Diagnosis not present

## 2020-09-28 DIAGNOSIS — R059 Cough, unspecified: Secondary | ICD-10-CM | POA: Diagnosis not present

## 2020-09-28 DIAGNOSIS — W010XXA Fall on same level from slipping, tripping and stumbling without subsequent striking against object, initial encounter: Secondary | ICD-10-CM | POA: Diagnosis not present

## 2020-10-04 ENCOUNTER — Emergency Department (HOSPITAL_COMMUNITY): Payer: Medicare Other

## 2020-10-04 ENCOUNTER — Emergency Department (HOSPITAL_COMMUNITY)
Admission: EM | Admit: 2020-10-04 | Discharge: 2020-10-04 | Disposition: A | Discharge status: Home / Self Care | Payer: Medicare Other | Attending: Emergency Medicine | Admitting: Emergency Medicine

## 2020-10-04 ENCOUNTER — Encounter (HOSPITAL_COMMUNITY): Payer: Self-pay

## 2020-10-04 ENCOUNTER — Other Ambulatory Visit: Payer: Self-pay

## 2020-10-04 ENCOUNTER — Telehealth: Payer: Self-pay | Admitting: *Deleted

## 2020-10-04 ENCOUNTER — Telehealth: Payer: Self-pay

## 2020-10-04 DIAGNOSIS — F17210 Nicotine dependence, cigarettes, uncomplicated: Secondary | ICD-10-CM | POA: Diagnosis not present

## 2020-10-04 DIAGNOSIS — Z79899 Other long term (current) drug therapy: Secondary | ICD-10-CM | POA: Insufficient documentation

## 2020-10-04 DIAGNOSIS — J449 Chronic obstructive pulmonary disease, unspecified: Secondary | ICD-10-CM | POA: Insufficient documentation

## 2020-10-04 DIAGNOSIS — W19XXXA Unspecified fall, initial encounter: Secondary | ICD-10-CM | POA: Insufficient documentation

## 2020-10-04 DIAGNOSIS — Y92009 Unspecified place in unspecified non-institutional (private) residence as the place of occurrence of the external cause: Secondary | ICD-10-CM

## 2020-10-04 DIAGNOSIS — S80211A Abrasion, right knee, initial encounter: Secondary | ICD-10-CM | POA: Insufficient documentation

## 2020-10-04 DIAGNOSIS — M25561 Pain in right knee: Secondary | ICD-10-CM | POA: Diagnosis not present

## 2020-10-04 DIAGNOSIS — I10 Essential (primary) hypertension: Secondary | ICD-10-CM | POA: Insufficient documentation

## 2020-10-04 MED ORDER — BACITRACIN ZINC 500 UNIT/GM EX OINT: 1.0000 "application " | TOPICAL_OINTMENT | Freq: Two times a day (BID) | CUTANEOUS | 0 refills | Status: DC

## 2020-10-04 MED ORDER — BACITRACIN ZINC 500 UNIT/GM EX OINT
1.0000 "application " | TOPICAL_OINTMENT | Freq: Two times a day (BID) | CUTANEOUS | Status: DC
  Filled 2020-10-04: qty 0.9

## 2020-10-04 NOTE — Telephone Encounter (Signed)
-----   Message from [name], MD sent at 10/03/2020  8:10 PM EDT ----- BCC (specimens 1+ 2) is recurrent and we will refer for Mohs.  Schedule surgery with Dr. Darene Lamer for the other 2 spots.

## 2020-10-04 NOTE — Telephone Encounter (Signed)
[facility]'s referral sent.

## 2020-10-04 NOTE — ED Provider Notes (Signed)
[facility] EMERGENCY DEPARTMENT Provider Note   CSN: [id number] Arrival date & time: 10/04/20  1216     History Chief Complaint  Patient presents with   [name] is a 65 y.o. female.   Fall Pertinent negatives include no chest pain, no abdominal pain and no shortness of breath.      [name] is a 65 y.o. female who presents to the Emergency Department requesting evaluation of possible cellulitis of her right knee and lower leg.  She states that she fell going up some steps several days ago.  She was seen at another ER facility on 09/25/2020 and had x-rays of her chest and bilateral lower legs.  Reports no fractures.  She has an abrasion to her anterior right knee that she is concerned may be infected.  She states the area is draining some yellow discharge and she is concerned about the redness of her anterior right lower leg.  She describes some discomfort of her knee with weightbearing.  She denies any swelling, head injury, or LOC.  No back or neck pain.  No reported fever or chills.  She takes aspirin, but no blood thinners.   Past Medical History:  Diagnosis Date   Allergy    Arthritis    Atypical mole 06/05/2003   slight-mod-Left scapula (WS)   Atypical nevus 07/26/2003   persist. dysp- Left scapula (WS)   Basal cell carcinoma 06/05/2003   RIght chest (CX35FU)   Basal cell carcinoma 05/22/2004   superificial- RIght upper back- (CX35FU)   Basal cell carcinoma 11/27/2004   beside R nostril-(MOHS), below right outer nose (MOHS)   Basal cell carcinoma 11/08/2009   left post shoulder -(txpbx)   Basal cell carcinoma 05/24/2012   ulcerated- Left post shoulder- (CX35FU), ulcerated-mid forehead (EXC )   Basal cell carcinoma 06/24/2012   superificial- Right shoulder - (txpbx)    Basal cell carcinoma 07/29/2012    sclerosis- mid forehead (MOHS)   Basal cell carcinoma 06/06/2019   nod-right anterior neck (CX35FU)   Cataract    Constipation    uses OTC  meds with help   COPD (chronic obstructive pulmonary disease) ([place])    Coronary artery disease    minimal nonobstructive    COVID    Depression    GERD (gastroesophageal reflux disease)    Heart murmur    Hx of adenomatous polyp of colon 10/03/2014   Hypercholesteremia    Hypertension    Panic attacks    Perimenopausal    Skin cancer    Squamous cell carcinoma of skin 06/05/2003   Right [place](CX35FU)   Squamous cell carcinoma of skin 05/22/2004   in situ- Left upperarm (CX35FU)   Squamous cell carcinoma of skin 11/27/2004   in situ- above Left elbow (CX35FU)   Squamous cell carcinoma of skin 03/04/2006   in situ- Left sideburn (CX35FU)   Squamous cell carcinoma of skin 11/26/2006   in situ- mid brow (Cx35FU)   Squamous cell carcinoma of skin 05/24/2012   in situ- Left chest- (CX35FU)   Squamous cell carcinoma of skin 04/05/2013   in situ- Left nose (Cx35FU)   Squamous cell carcinoma of skin 08/17/2013   well diff-above Left eye (txpbx)   Squamous cell carcinoma of skin 07/30/2016   in situ- right chest sup (Cx35FU)   Squamous cell carcinoma of skin 05/12/2017   in situ- RIght shoulder (CX35FU), in situ- Left forehead (CX35FU)   Squamous cell carcinoma of skin  11/23/2018   in situ- Right mid shin, inner- (MOHS)   Tobacco user     Patient Active Problem List   Diagnosis Date Noted   Lumbar herniated disc 12/10/2018   Major depression 04/02/2015   Benzodiazepine abuse, continuous ([place]) 01/22/2015   Opiate dependence ([place]) 01/22/2015   Hx of adenomatous polyp of colon 10/03/2014   Vitamin D deficiency 05/08/2014   COPD (chronic obstructive pulmonary disease) ([place]) 05/06/2011   Hyperlipidemia 05/05/2011   INSOMNIA 11/13/2008   TOBACCO ABUSE 11/10/2008   Essential hypertension 11/10/2008   Osteoarthritis 11/10/2008    Past Surgical History:  Procedure Laterality Date   ABDOMINAL HYSTERECTOMY     COLONOSCOPY     Right foot toe surgery     UPPER GASTROINTESTINAL  ENDOSCOPY     vocal cord     polyp removal     OB History   No obstetric history on file.     Family History  Problem Relation Age of Onset   Heart disease Mother    Heart attack Mother    Heart attack Father    Heart attack Brother    Colon cancer Brother 52       died at age 89   Anxiety disorder Brother    Depression Brother    Alcohol abuse Brother    Colon polyps Brother    Colon polyps Brother    Colon polyps Sister    Esophageal cancer Sister    Anxiety disorder Sister    Depression Sister    Colon polyps Sister    Drug abuse Son    Rectal cancer Neg Hx    Stomach cancer Neg Hx     Social History   Tobacco Use   Smoking status: Every Day    Packs/day: 1.00    Years: 40.00    Pack years: 40.00    Types: Cigarettes    Start date: 05/01/1969   Smokeless tobacco: Never  Vaping Use   Vaping Use: Never used  Substance Use Topics   Alcohol use: No    Alcohol/week: 0.0 standard drinks   Drug use: No    Comment: per pt, Cocaine was in her system October and November 2016 when she went to the pain clinic 04-02-15.    Home Medications Prior to Admission medications   Medication Sig Start Date End Date Taking? Authorizing Provider  albuterol (VENTOLIN HFA) 108 (90 Base) MCG/ACT inhaler Inhale 2 puffs into the lungs every 6 (six) hours as needed for wheezing or shortness of breath. 07/11/20   Mannam, Hart Robinsons, MD  amLODipine (NORVASC) 10 MG tablet TAKE ONE TABLET BY MOUTH DAILY 05/21/20   Arnoldo Lenis, MD  Ascorbic Acid (VITAMIN C) 1000 MG tablet Take 1,000 mg by mouth daily.    [provider]  aspirin EC 81 MG tablet Take 81 mg by mouth daily.    [provider]  atorvastatin (LIPITOR) 40 MG tablet Take 1 tablet (40 mg total) by mouth at bedtime. 08/28/20   Claretta Fraise, MD  budesonide-formoterol (SYMBICORT) 160-4.5 MCG/ACT inhaler INHALE TWO PUFFS INTO THE LUNGS BY MOUTH TWICE DAILY 08/16/20   Claretta Fraise, MD  buPROPion (WELLBUTRIN XL)  300 MG 24 hr tablet Take 1 tablet (300 mg total) by mouth daily. 08/28/20   Claretta Fraise, MD  cholecalciferol (VITAMIN D3) 25 MCG (1000 UNIT) tablet Take 1,000 Units by mouth daily.    [provider]  clonazePAM (KLONOPIN) 1 MG tablet Take 1 tablet (1 mg total) by  mouth 3 (three) times daily as needed. for anxiety 08/01/20   Cloria Spring, MD  cloNIDine (CATAPRES) 0.1 MG tablet TAKE ONE TABLET BY MOUTH AT BEDTIME 08/16/20   Claretta Fraise, MD  dexlansoprazole (DEXILANT) 60 MG capsule TAKE ONE CAPSULE BY MOUTH DAILY ON EMPTY STOMACH, THEN DO NOT EAT FOR ONE HOUR. (REPLACES PANTROPRAZOLE FOR REFLUX) 08/16/20   Claretta Fraise, MD  DULoxetine (CYMBALTA) 60 MG capsule Take 1 capsule (60 mg total) by mouth 2 (two) times daily. 08/01/20   Cloria Spring, MD  fexofenadine (ALLEGRA) 180 MG tablet Take 1 tablet (180 mg total) by mouth daily. For allergy symptoms 02/28/20   Claretta Fraise, MD  fish oil-omega-3 fatty acids 1000 MG capsule Take 1 g by mouth daily.     [provider]  gabapentin (NEURONTIN) 300 MG capsule Take 300 mg by mouth 3 (three) times daily. 06/15/18   [provider]  ipratropium-albuterol (DUONEB) 0.5-2.5 (3) MG/3ML SOLN Take 3 mLs by nebulization every 6 (six) hours as needed. 02/28/20   Claretta Fraise, MD  lisinopril (ZESTRIL) 20 MG tablet Take 1 tablet (20 mg total) by mouth daily. 08/28/20   Claretta Fraise, MD  meloxicam (MOBIC) 15 MG tablet Take 15 mg by mouth daily. 11/26/19   [provider]  nicotine (NICODERM CQ - DOSED IN MG/24 HOURS) 21 mg/24hr patch Place 1 patch (21 mg total) onto the skin daily. 06/20/20   Mannam, Hart Robinsons, MD  oxyCODONE (ROXICODONE) 15 MG immediate release tablet Take 15 mg by mouth 4 (four) times daily as needed. 03/08/20   [provider]  pantoprazole (PROTONIX) 40 MG tablet Take 1 tablet (40 mg total) by mouth 2 (two) times daily. 08/28/20   Claretta Fraise, MD  predniSONE (DELTASONE) 10 MG tablet Take '60mg'$  x 2 days,  then '50mg'$  x 2 days, then '40mg'$  x 2 days, then '30mg'$  x 2 days, then '20mg'$  x 2 days, then '10mg'$  x 2 days, then stop. 07/11/20   Marshell Garfinkel, MD  SPIRIVA RESPIMAT 2.5 MCG/ACT AERS INHALE TWO PUFFS INTO THE LUNGS DAILY 08/16/20   Claretta Fraise, MD  sucralfate (CARAFATE) 1 g tablet TAKE ONE TABLET BY MOUTH TWICE DAILY 08/13/20   Claretta Fraise, MD  varenicline (CHANTIX STARTING MONTH PAK) 0.5 MG X 11 & 1 MG X 42 tablet Take 1 0.5 mg tablet once daily for 3 days, increase to 1 0.5 mg tablet twice daily for 4 days, increase to 1 1 mg tablet twice daily. 06/20/20   Mannam, Hart Robinsons, MD  Vitamin D, Ergocalciferol, (DRISDOL) 1.25 MG (50000 UNIT) CAPS capsule Take 1 capsule (50,000 Units total) by mouth every 7 (seven) days. 08/31/20   Claretta Fraise, MD    Allergies    Lorcet [phone] [hydrocodone-acetaminophen], Sulfa antibiotics, and Tape  Review of Systems   Review of Systems  Constitutional:  Negative for chills, fatigue and fever.  Respiratory:  Negative for cough and shortness of breath.   Cardiovascular:  Negative for chest pain and leg swelling.  Gastrointestinal:  Negative for abdominal pain, nausea and vomiting.  Musculoskeletal:  Positive for arthralgias (right knee pain). Negative for back pain, myalgias, neck pain and neck stiffness.  Skin:  Positive for color change and wound (abrasion right knee). Negative for rash.  Neurological:  Negative for dizziness, syncope, weakness and numbness.  Hematological:  Does not bruise/bleed easily.   Physical Exam Updated Vital Signs BP 126/80 (BP Location: Right Arm)   Pulse 94   Temp 98.4 F (36.9 C) (Oral)  Resp 18   Ht '5\' 5"'$  (1.651 m)   Wt 73.5 kg   SpO2 97%   BMI 26.96 kg/m   Physical Exam Vitals and nursing note reviewed.  Constitutional:      Appearance: Normal appearance. She is not ill-appearing or toxic-appearing.  HENT:     Head: Atraumatic.  Cardiovascular:     Rate and Rhythm: Normal rate and regular rhythm.     Pulses: Normal  pulses.  Pulmonary:     Effort: Pulmonary effort is normal.     Comments: Coarse lung sounds bilaterally.  No wheezing or rales.  No increased work of breathing. Abdominal:     Palpations: Abdomen is soft.     Tenderness: There is no abdominal tenderness.  Musculoskeletal:        General: Tenderness and signs of injury present. No deformity. Normal range of motion.     Cervical back: No tenderness.     Right lower leg: No edema.     Left lower leg: No edema.     Comments: Tenderness to palpation of the anterior right knee with abrasion present over the patella.  No palpable effusion.  No tenderness with valgus or varus stress.  Negative drawer sign.  Patient has full range of motion of the knee. there is a focal area of tenderness to the anterior right lower leg as well.  No bony deformity.  Scattered ecchymosis to the anterior right lower leg.  Compartments are soft.  No excessive warmth or edema.  Abrasion appears to be healing well.  Skin:    General: Skin is warm.     Capillary Refill: Capillary refill takes less than 2 seconds.  Neurological:     General: No focal deficit present.     Mental Status: She is alert.     Sensory: No sensory deficit.     Motor: No weakness.    ED Results / Procedures / Treatments   Labs (all labs ordered are listed, but only abnormal results are displayed) Labs Reviewed - No data to display  EKG None  Radiology DG Knee Complete 4 Views Right  Result Date: 10/04/2020 CLINICAL DATA:  Fall, right knee pain EXAM: RIGHT KNEE - COMPLETE 4+ VIEW COMPARISON:  None. FINDINGS: There is no evidence of acute fracture or dislocation. There is mild medial predominant degenerative change of the right knee. There is no joint effusion. Vascular calcifications. IMPRESSION: No acute fracture or joint effusion. Electronically Signed   By: Maurine Simmering   On: 10/04/2020 14:05    Procedures Procedures   Medications Ordered in ED Medications  bacitracin ointment 1  application (has no administration in time range)    ED Course  I have reviewed the triage vital signs and the nursing notes.  Pertinent labs & imaging results that were available during my care of the patient were reviewed by me and considered in my medical decision making (see chart for details).    MDM Rules/Calculators/A&P                           Patient here for evaluation of injury sustained in a mechanical fall that occurred several days ago.  She was initially evaluated at another emergency department.  X-rays were reviewed by me.  No bony injuries to the bilateral tib-fib's.  Today, patient is here for evaluation of concerns for cellulitis.  She notes developing scattered redness to her right lower leg and she has an  abrasion to anterior right knee.  Patient is able to fully range of motion the right knee without difficulty.  No obvious ligamentous instability.  No palpable effusion or concerns for abscess.  Scattered redness to the lower leg is secondary to bruising.  Compartments of the extremity are soft.  Neurovascularly intact.  Cap refill is good.  I have ordered a dedicated knee film that is also without acute fracture or effusion.  No concerning symptoms for septic joint.  Patient is well-appearing and nontoxic.  Wound was cleaned and dressed with bacitracin.  Patient is agreeable to close outpatient follow-up with PCP and referral information also given for orthopedics.  Patient appears appropriate for discharge home.   Final Clinical Impression(s) / ED Diagnoses Final diagnoses:  Fall in home, initial encounter  Abrasion of right knee, initial encounter    Rx / DC Orders ED Discharge Orders     None        Kem Parkinson, PA-C 10/04/20 1518    Lajean Saver, MD 10/15/20 1715

## 2020-10-04 NOTE — Telephone Encounter (Signed)
Pathology to patient.  °

## 2020-10-04 NOTE — Discharge Instructions (Addendum)
Clean the abrasion gently with mild soap and water.  You may apply the ointment twice daily and keep the area covered with activity.  You may remove the bandages while at rest.  Follow-up with your primary care provider for recheck or you may contact the orthopedic provider listed to arrange follow-up in 1 week if your knee pain is not improving.  Return to the emergency department for any new or worsening symptoms.

## 2020-10-04 NOTE — ED Triage Notes (Signed)
Fell on 7/26 and is worried about the healing process. Left arm covered, right knee covered worried about cellulitis.  Pt alert and oriented. Right leg no swelling noted leg discolored from healing wounds.  Good PMS

## 2020-10-08 ENCOUNTER — Other Ambulatory Visit (HOSPITAL_COMMUNITY): Payer: Self-pay | Admitting: Psychiatry

## 2020-10-08 ENCOUNTER — Other Ambulatory Visit: Payer: Self-pay | Admitting: Family Medicine

## 2020-10-08 DIAGNOSIS — F332 Major depressive disorder, recurrent severe without psychotic features: Secondary | ICD-10-CM

## 2020-10-08 DIAGNOSIS — J439 Emphysema, unspecified: Secondary | ICD-10-CM

## 2020-10-11 DIAGNOSIS — G43909 Migraine, unspecified, not intractable, without status migrainosus: Secondary | ICD-10-CM | POA: Diagnosis not present

## 2020-10-11 DIAGNOSIS — G5622 Lesion of ulnar nerve, left upper limb: Secondary | ICD-10-CM | POA: Diagnosis not present

## 2020-10-11 DIAGNOSIS — Z79899 Other long term (current) drug therapy: Secondary | ICD-10-CM | POA: Diagnosis not present

## 2020-10-11 DIAGNOSIS — M5417 Radiculopathy, lumbosacral region: Secondary | ICD-10-CM | POA: Diagnosis not present

## 2020-10-11 DIAGNOSIS — G5603 Carpal tunnel syndrome, bilateral upper limbs: Secondary | ICD-10-CM | POA: Diagnosis not present

## 2020-10-11 DIAGNOSIS — M5412 Radiculopathy, cervical region: Secondary | ICD-10-CM | POA: Diagnosis not present

## 2020-10-13 ENCOUNTER — Other Ambulatory Visit (HOSPITAL_COMMUNITY): Payer: Self-pay | Admitting: Psychiatry

## 2020-10-13 DIAGNOSIS — F411 Generalized anxiety disorder: Secondary | ICD-10-CM

## 2020-10-15 ENCOUNTER — Encounter: Payer: Self-pay | Admitting: Family Medicine

## 2020-10-15 ENCOUNTER — Ambulatory Visit (INDEPENDENT_AMBULATORY_CARE_PROVIDER_SITE_OTHER): Payer: Medicare Other | Admitting: Family Medicine

## 2020-10-15 ENCOUNTER — Encounter: Payer: Self-pay | Admitting: Dermatology

## 2020-10-15 DIAGNOSIS — U071 COVID-19: Secondary | ICD-10-CM | POA: Diagnosis not present

## 2020-10-15 MED ORDER — NYSTATIN 100000 UNIT/ML MT SUSP: 5.0000 mL | Freq: Four times a day (QID) | OROMUCOSAL | 1 refills | Status: DC | PRN

## 2020-10-15 MED ORDER — MOLNUPIRAVIR EUA 200MG CAPSULE: 4.0000 | ORAL_CAPSULE | Freq: Two times a day (BID) | ORAL | 0 refills | Status: AC

## 2020-10-15 NOTE — Progress Notes (Signed)
Subjective:    Patient ID: [name], female    DOB: 1955/03/10, 65 y.o.   MRN: [id number]   HPI: [name] is a 65 y.o. female presenting for HA. Runny nose. Blisters in mouth and at the corners. Looks like thrush. Congested and coughing. Feels dyspneic sitting still. Not that bad. Denies fever. Onset night before last. Had subjective fever only, but not on thermometer. Covid test was positive yesterday and again today.    Depression screen [facility] 2/9 08/28/2020 08/28/2020 04/02/2020 02/28/2020 08/22/2019  Decreased Interest 3 0 2 2 0  Down, Depressed, Hopeless 2 0 '2 2 1  '$ PHQ - 2 Score 5 0 '4 4 1  '$ Altered sleeping 2 - 1 1 -  Tired, decreased energy 2 - 2 2 -  Change in appetite 2 - 1 1 -  Feeling bad or failure about yourself  1 - 0 0 -  Trouble concentrating 2 - 2 2 -  Moving slowly or fidgety/restless 0 - 0 0 -  Suicidal thoughts 0 - 0 0 -  PHQ-9 Score 14 - 10 10 -  Difficult doing work/chores Somewhat difficult - Somewhat difficult Somewhat difficult -  Some encounter information is confidential and restricted. Go to Review Flowsheets activity to see all data.  Some recent data might be hidden     Relevant past medical, surgical, family and social history reviewed and updated as indicated.  Interim medical history since our last visit reviewed. Allergies and medications reviewed and updated.  ROS:  Review of Systems  Constitutional:  Negative for activity change, appetite change, chills and fever.  HENT:  Positive for congestion, mouth sores, postnasal drip and rhinorrhea. Negative for ear discharge, ear pain, hearing loss, nosebleeds, sneezing and trouble swallowing.   Respiratory:  Positive for cough and shortness of breath. Negative for chest tightness.   Cardiovascular:  Negative for chest pain and palpitations.  Skin:  Negative for rash.    Social History   Tobacco Use  Smoking Status Every Day   Packs/day: 1.00   Years: 40.00   Pack years: 40.00   Types:  Cigarettes   Start date: 05/01/1969  Smokeless Tobacco Never       Objective:     Wt Readings from Last 3 Encounters:  10/04/20 162 lb (73.5 kg)  08/28/20 159 lb 3.2 oz (72.2 kg)  07/09/20 157 lb (71.2 kg)     Exam deferred. Pt. [place] due to COVID 19. Phone visit performed.   Assessment & Plan:   1. COVID-19 virus infection     Meds ordered this encounter  Medications   molnupiravir EUA 200 mg CAPS    Sig: Take 4 capsules (800 mg total) by mouth 2 (two) times daily for 5 days.    Dispense:  40 capsule    Refill:  0   magic mouthwash (nystatin, lidocaine, diphenhydrAMINE, alum & mag hydroxide) suspension    Sig: Swish and spit 5 mLs 4 (four) times daily as needed for mouth pain.    Dispense:  180 mL    Refill:  1    No orders of the defined types were placed in this encounter.     Diagnoses and all orders for this visit:  COVID-19 virus infection  Other orders -     molnupiravir EUA 200 mg CAPS; Take 4 capsules (800 mg total) by mouth 2 (two) times daily for 5 days. -     magic mouthwash (nystatin, lidocaine, diphenhydrAMINE, alum &  mag hydroxide) suspension; Swish and spit 5 mLs 4 (four) times daily as needed for mouth pain.   Virtual Visit via telephone Note  I discussed the limitations, risks, security and privacy concerns of performing an evaluation and management service by telephone and the availability of in person appointments. The patient was identified with two identifiers. Pt.expressed understanding and agreed to proceed. Pt. Is at home. Dr. Livia Snellen is in his office.  Follow Up Instructions:   I discussed the assessment and treatment plan with the patient. The patient was provided an opportunity to ask questions and all were answered. The patient agreed with the plan and demonstrated an understanding of the instructions.   The patient was advised to call back or seek an in-person evaluation if the symptoms worsen or if the condition fails to improve  as anticipated.   Total minutes including chart review and phone contact time: 12   Follow up plan: No follow-ups on file.  Claretta Fraise, MD [place]

## 2020-10-15 NOTE — Progress Notes (Signed)
Follow-Up Visit   Subjective  [name] is a 65 y.o. female who presents for the following: Follow-up (Check facial lesions).  Several new bumps and crust on head and neck. Location:  Duration:  Quality:  Associated Signs/Symptoms: Modifying Factors:  Severity:  Timing: Context:   Objective  Well appearing patient in no apparent distress; mood and affect are within normal limits. Right Anterior Neck sup Biopsy proven BCC excised just 5 months ago with clear margins, but new pearly pink papules both anteriorly and posteriorly suggest residual carcinoma.     Right Anterior Neck inf     left inner cheek 7 mm pink waxy crust suggestive of superficial carcinoma.     Right Buccal Cheek Subtle 9 mm waxy crust, rule out superficial carcinoma.     Mid Forehead Gritty 4 mm pink crust    A focused examination was performed including head and neck.. Relevant physical exam findings are noted in the Assessment and Plan.   Assessment & Plan    Neoplasm of uncertain behavior of skin (4) Right Anterior Neck sup  Skin / nail biopsy Type of biopsy: tangential   Informed consent: discussed and consent obtained   Timeout: patient name, date of birth, surgical site, and procedure verified   Anesthesia: the lesion was anesthetized in a standard fashion   Anesthetic:  1% lidocaine w/ epinephrine 1-100,000 local infiltration Instrument used: flexible razor blade   Hemostasis achieved with: aluminum chloride and electrodesiccation   Outcome: patient tolerated procedure well   Post-procedure details: wound care instructions given    Specimen 1 - Surgical pathology Differential Diagnosis: bcc scc isk  Check Margins: No  Right Anterior Neck inf  Skin / nail biopsy Type of biopsy: tangential   Informed consent: discussed and consent obtained   Timeout: patient name, date of birth, surgical site, and procedure verified   Anesthesia: the lesion was anesthetized in  a standard fashion   Anesthetic:  1% lidocaine w/ epinephrine 1-100,000 local infiltration Instrument used: flexible razor blade   Hemostasis achieved with: aluminum chloride and electrodesiccation   Outcome: patient tolerated procedure well   Post-procedure details: wound care instructions given    Specimen 2 - Surgical pathology Differential Diagnosis: bcc scc iak  Check Margins: No  left inner cheek  Skin / nail biopsy Type of biopsy: tangential   Informed consent: discussed and consent obtained   Timeout: patient name, date of birth, surgical site, and procedure verified   Anesthesia: the lesion was anesthetized in a standard fashion   Anesthetic:  1% lidocaine w/ epinephrine 1-100,000 local infiltration Instrument used: flexible razor blade   Hemostasis achieved with: aluminum chloride and electrodesiccation   Outcome: patient tolerated procedure well   Post-procedure details: wound care instructions given    Specimen 3 - Surgical pathology Differential Diagnosis: bcc scc isk  Check Margins: No  Right Buccal Cheek  Skin / nail biopsy Type of biopsy: tangential   Informed consent: discussed and consent obtained   Timeout: patient name, date of birth, surgical site, and procedure verified   Anesthesia: the lesion was anesthetized in a standard fashion   Anesthetic:  1% lidocaine w/ epinephrine 1-100,000 local infiltration Instrument used: flexible razor blade   Hemostasis achieved with: aluminum chloride and electrodesiccation   Outcome: patient tolerated procedure well   Post-procedure details: wound care instructions given    Specimen 4 - Surgical pathology Differential Diagnosis: bcc scc isk  Check Margins: No  If biopsy confirms the neck lesion  has residual BCC we will recommend removal with Mohs surgery.  AK (actinic keratosis) Mid Forehead  Destruction of lesion - Mid Forehead Complexity: simple   Destruction method: cryotherapy   Informed consent:  discussed and consent obtained   Timeout:  patient name, date of birth, surgical site, and procedure verified Lesion destroyed using liquid nitrogen: Yes   Cryotherapy cycles:  3 Outcome: patient tolerated procedure well with no complications   Post-procedure details: wound care instructions given        I, [name], MD, have reviewed all documentation for this visit.  The documentation on 10/15/20 for the exam, diagnosis, procedures, and orders are all accurate and complete.

## 2020-10-17 DIAGNOSIS — M6281 Muscle weakness (generalized): Secondary | ICD-10-CM | POA: Diagnosis not present

## 2020-10-29 ENCOUNTER — Other Ambulatory Visit: Payer: Self-pay

## 2020-10-29 ENCOUNTER — Encounter (HOSPITAL_COMMUNITY): Payer: Self-pay | Admitting: Psychiatry

## 2020-10-29 ENCOUNTER — Telehealth (INDEPENDENT_AMBULATORY_CARE_PROVIDER_SITE_OTHER): Payer: Medicare Other | Admitting: Psychiatry

## 2020-10-29 DIAGNOSIS — F411 Generalized anxiety disorder: Secondary | ICD-10-CM | POA: Diagnosis not present

## 2020-10-29 DIAGNOSIS — F332 Major depressive disorder, recurrent severe without psychotic features: Secondary | ICD-10-CM | POA: Diagnosis not present

## 2020-10-29 MED ORDER — CLONAZEPAM 1 MG PO TABS: 1.0000 mg | ORAL_TABLET | Freq: Three times a day (TID) | ORAL | 2 refills | Status: DC | PRN

## 2020-10-29 MED ORDER — DULOXETINE HCL 60 MG PO CPEP: 60.0000 mg | ORAL_CAPSULE | Freq: Two times a day (BID) | ORAL | 2 refills | Status: DC

## 2020-10-29 NOTE — Progress Notes (Signed)
Virtual Visit via Telephone Note  I connected with [name] on 10/29/20 at  1:20 PM EDT by telephone and verified that I am speaking with the correct person using two identifiers.  Location: Patient: home Provider: home office   I discussed the limitations, risks, security and privacy concerns of performing an evaluation and management service by telephone and the availability of in person appointments. I also discussed with the patient that there may be a patient responsible charge related to this service. The patient expressed understanding and agreed to proceed.      I discussed the assessment and treatment plan with the patient. The patient was provided an opportunity to ask questions and all were answered. The patient agreed with the plan and demonstrated an understanding of the instructions.   The patient was advised to call back or seek an in-person evaluation if the symptoms worsen or if the condition fails to improve as anticipated.  I provided 15 minutes of non-face-to-face time during this encounter.   [name], MD  [facility] MD/PA/NP OP Progress Note  10/29/2020 1:32 PM ABRIAL Macdonald  MRN:  [id number]  Chief Complaint:  Chief Complaint   Depression; Anxiety; Follow-up    HPI: This patient is a 65 year old female who is widowed and lives alone in [place].  She is on disability.  She returns after 3 months regarding depression and anxiety  The patient states that she is still stressed.  She is still in a dispute with her landlord who claims that she and her husband owed him money dating back to prior to her husband's death.  They are finally going to court next week.  She may get foreclosed upon if she loses.  She is not sure where she would stay because her sons are abusing drugs and her brother may or may not allow her to calm.  She is still constantly worrying about this.  Despite this she states that her mood is generally fairly good and the medications have been  helping her depression and anxiety.  She is sleeping and eating fairly well.  She is trying to work on smoking cessation and has cut down quite a bit by her report Visit Diagnosis:    ICD-10-CM   1. Severe episode of recurrent major depressive disorder, without psychotic features (HCC)  F33.2 DULoxetine (CYMBALTA) 60 MG capsule    2. GAD (generalized anxiety disorder)  F41.1 clonazePAM (KLONOPIN) 1 MG tablet      Past Psychiatric History: none  Past Medical History:  Past Medical History:  Diagnosis Date   Allergy    Arthritis    Atypical mole 06/05/2003   slight-mod-Left scapula (WS)   Atypical nevus 07/26/2003   persist. dysp- Left scapula (WS)   Basal cell carcinoma 06/05/2003   RIght chest (CX35FU)   Basal cell carcinoma 05/22/2004   superificial- RIght upper back- (CX35FU)   Basal cell carcinoma 11/27/2004   beside R nostril-(MOHS), below right outer nose (MOHS)   Basal cell carcinoma 11/08/2009   left post shoulder -(txpbx)   Basal cell carcinoma 05/24/2012   ulcerated- Left post shoulder- (CX35FU), ulcerated-mid forehead (EXC )   Basal cell carcinoma 06/24/2012   superificial- Right shoulder - (txpbx)    Basal cell carcinoma 07/29/2012    sclerosis- mid forehead (MOHS)   Basal cell carcinoma 06/06/2019   nod-right anterior neck (CX35FU)   Cataract    Constipation    uses OTC meds with help   COPD (chronic obstructive pulmonary disease) (  [place])    Coronary artery disease    minimal nonobstructive    COVID    Depression    GERD (gastroesophageal reflux disease)    Heart murmur    Hx of adenomatous polyp of colon 10/03/2014   Hypercholesteremia    Hypertension    Panic attacks    Perimenopausal    Skin cancer    Squamous cell carcinoma of skin 06/05/2003   Right [place](CX35FU)   Squamous cell carcinoma of skin 05/22/2004   in situ- Left upperarm (CX35FU)   Squamous cell carcinoma of skin 11/27/2004   in situ- above Left elbow (CX35FU)   Squamous cell  carcinoma of skin 03/04/2006   in situ- Left sideburn (CX35FU)   Squamous cell carcinoma of skin 11/26/2006   in situ- mid brow (Cx35FU)   Squamous cell carcinoma of skin 05/24/2012   in situ- Left chest- (CX35FU)   Squamous cell carcinoma of skin 04/05/2013   in situ- Left nose (Cx35FU)   Squamous cell carcinoma of skin 08/17/2013   well diff-above Left eye (txpbx)   Squamous cell carcinoma of skin 07/30/2016   in situ- right chest sup (Cx35FU)   Squamous cell carcinoma of skin 05/12/2017   in situ- RIght shoulder (CX35FU), in situ- Left forehead (CX35FU)   Squamous cell carcinoma of skin 11/23/2018   in situ- Right mid shin, inner- (MOHS)   Tobacco user     Past Surgical History:  Procedure Laterality Date   ABDOMINAL HYSTERECTOMY     COLONOSCOPY     Right foot toe surgery     UPPER GASTROINTESTINAL ENDOSCOPY     vocal cord     polyp removal    Family Psychiatric History: see below  Family History:  Family History  Problem Relation Age of Onset   Heart disease Mother    Heart attack Mother    Heart attack Father    Heart attack Brother    Colon cancer Brother 81       died at age 26   Anxiety disorder Brother    Depression Brother    Alcohol abuse Brother    Colon polyps Brother    Colon polyps Brother    Colon polyps Sister    Esophageal cancer Sister    Anxiety disorder Sister    Depression Sister    Colon polyps Sister    Drug abuse Son    Rectal cancer Neg Hx    Stomach cancer Neg Hx     Social History:  Social History   Socioeconomic History   Marital status: Single    Spouse name: Not on file   Number of children: 2   Years of education: Not on file   Highest education level: Not on file  Occupational History   Occupation: Scientist, 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   Sexual activity: Not on file  Other Topics Concern   Not on file  Social History Narrative   Single   Smoker   No alcohol or drug use reported         Social Determinants of Radio broadcast assistant Strain: Low Risk    Difficulty of Paying Living Expenses: Not very hard  Food Insecurity: Not on file  Transportation Needs: No Transportation Needs   Lack of Transportation (Medical): No   Lack of Transportation (Non-Medical): No  Physical Activity: Not on file  Stress: Not on file  Social Connections: Not on file    Allergies:  Allergies  Allergen Reactions   Lorcet 10-650 [Hydrocodone-Acetaminophen] Itching   Sulfa Antibiotics Other (See Comments)    aching   Tape Other (See Comments)    Please use paper tape    Metabolic Disorder Labs: Lab Results  Component Value Date   HGBA1C 5.9 (H) 07/12/2013   MPG 123 (H) 07/12/2013   No results found for: PROLACTIN Lab Results  Component Value Date   CHOL 172 08/28/2020   TRIG 152 (H) 08/28/2020   HDL 50 08/28/2020   CHOLHDL 3.4 08/28/2020   VLDL 36 07/13/2013   LDLCALC 96 08/28/2020   LDLCALC 109 (H) 02/28/2020   Lab Results  Component Value Date   TSH 1.820 05/25/2017   TSH 1.080 05/17/2015    Therapeutic Level Labs: No results found for: LITHIUM No results found for: VALPROATE No components found for:  CBMZ  Current Medications: Current Outpatient Medications  Medication Sig Dispense Refill   albuterol (VENTOLIN HFA) 108 (90 Base) MCG/ACT inhaler Inhale 2 puffs into the lungs every 6 (six) hours as needed for wheezing or shortness of breath. 8 g 5   amLODipine (NORVASC) 10 MG tablet TAKE ONE TABLET BY MOUTH DAILY 90 tablet 1   Ascorbic Acid (VITAMIN  C) 1000 MG tablet Take 1,000 mg by mouth daily.     aspirin EC 81 MG tablet Take 81 mg by mouth daily.     atorvastatin (LIPITOR) 40 MG tablet Take 1 tablet (40 mg total) by mouth at bedtime. 90 tablet 1   bacitracin ointment Apply 1 application topically 2 (two) times daily. Apply to the abrasion twice daily 28 g 0   budesonide-formoterol (SYMBICORT) 160-4.5 MCG/ACT inhaler INHALE TWO PUFFS INTO THE LUNGS TWICE DAILY 10.2 g 0   buPROPion (WELLBUTRIN XL) 300 MG 24 hr tablet Take 1 tablet (300 mg total) by mouth daily. 90 tablet 1   cholecalciferol (VITAMIN D3) 25 MCG (1000 UNIT) tablet Take 1,000 Units by mouth daily.     clonazePAM (KLONOPIN) 1 MG tablet Take 1 tablet (1 mg total) by mouth 3 (three) times daily as needed. for anxiety 90 tablet 2   cloNIDine (CATAPRES) 0.1 MG tablet TAKE ONE TABLET BY MOUTH AT BEDTIME 90 tablet 0   dexlansoprazole (DEXILANT) 60 MG capsule TAKE ONE CAPSULE BY MOUTH DAILY ON EMPTY STOMACH, THEN DO NOT EAT FOR ONE HOUR. (REPLACES PANTROPRAZOLE FOR REFLUX) 90 capsule 0   DULoxetine (CYMBALTA) 60 MG capsule Take 1 capsule (60 mg total) by mouth 2 (two) times daily. 60 capsule 2   fexofenadine (ALLEGRA) 180 MG tablet Take 1 tablet (180 mg total) by mouth daily. For allergy symptoms 30 tablet 11   fish oil-omega-3 fatty acids 1000 MG capsule Take 1 g by mouth daily.      gabapentin (NEURONTIN) 300 MG capsule Take 300 mg by mouth 3 (three) times daily.     ipratropium-albuterol (DUONEB) 0.5-2.5 (3) MG/3ML SOLN Take 3 mLs by nebulization every  6 (six) hours as needed. 360 mL 5   lisinopril (ZESTRIL) 20 MG tablet Take 1 tablet (20 mg total) by mouth daily. 90 tablet 1   magic mouthwash (nystatin, lidocaine, diphenhydrAMINE, alum & mag hydroxide) suspension Swish and spit 5 mLs 4 (four) times daily as needed for mouth pain. 180 mL 1   meloxicam (MOBIC) 15 MG tablet Take 15 mg by mouth daily.     nicotine (NICODERM CQ - DOSED IN MG/24 HOURS) 21 mg/24hr patch Place 1 patch (21  mg total) onto the skin daily. 28 patch 0   oxyCODONE (ROXICODONE) 15 MG immediate release tablet Take 15 mg by mouth 4 (four) times daily as needed.     pantoprazole (PROTONIX) 40 MG tablet Take 1 tablet (40 mg total) by mouth 2 (two) times daily. 180 tablet 1   predniSONE (DELTASONE) 10 MG tablet Take '60mg'$  x 2 days, then '50mg'$  x 2 days, then '40mg'$  x 2 days, then '30mg'$  x 2 days, then '20mg'$  x 2 days, then '10mg'$  x 2 days, then stop. 42 tablet 0   SPIRIVA RESPIMAT 2.5 MCG/ACT AERS INHALE TWO PUFFS INTO THE LUNGS DAILY 4 g 0   sucralfate (CARAFATE) 1 g tablet TAKE ONE TABLET BY MOUTH TWICE DAILY 60 tablet 0   varenicline (CHANTIX STARTING MONTH PAK) 0.5 MG X 11 & 1 MG X 42 tablet Take 1 0.5 mg tablet once daily for 3 days, increase to 1 0.5 mg tablet twice daily for 4 days, increase to 1 1 mg tablet twice daily. 53 tablet 0   Vitamin D, Ergocalciferol, (DRISDOL) 1.25 MG (50000 UNIT) CAPS capsule Take 1 capsule (50,000 Units total) by mouth every 7 (seven) days. 13 capsule 3   No current facility-administered medications for this visit.     Musculoskeletal: Strength & Muscle Tone: na Gait & Station: na Patient leans: N/A  Psychiatric Specialty Exam: Review of Systems  Psychiatric/Behavioral:  The patient is nervous/anxious.   All other systems reviewed and are negative.  There were no vitals taken for this visit.There is no height or weight on file to calculate BMI.  General Appearance: NA  Eye Contact:  NA  Speech:  Clear and Coherent  Volume:  Normal  Mood:  Anxious  Affect:  NA  Thought Process:  Goal Directed  Orientation:  Full (Time, Place, and Person)  Thought Content: Rumination   Suicidal Thoughts:  No  Homicidal Thoughts:  No  Memory:  Immediate;   Good Recent;   Good Remote;   Fair  Judgement:  Good  Insight:  Fair  Psychomotor Activity:  Decreased  Concentration:  Concentration: Good and Attention Span: Good  Recall:  Good  Fund of Knowledge: Good  Language: Good   Akathisia:  No  Handed:  Right  AIMS (if indicated): not done  Assets:  Communication Skills Desire for Improvement Resilience Social Support  ADL's:  Intact  Cognition: WNL  Sleep:  Good   Screenings: GAD-7    Flowsheet Row Office Visit from 08/28/2020 in [place] from 11/20/2015 in [place] Office Visit from 02/09/2015 in [place]  Total GAD-7 Score '7 9 21      '$ PHQ2-9    Flowsheet Row Video Visit from 10/29/2020 in [place] Office Visit from 08/28/2020 in [place] Video Visit from 08/01/2020 in [place] from 04/02/2020 in [place] Office Visit from 02/28/2020  in [place]  PHQ-2 Total Score '1 5 4 4 4  '$ PHQ-9 Total Score -- '14 11 10 10      '$ Flowsheet Row Video Visit from 10/29/2020 in [place] ED from 10/04/2020 in [place] Video Visit from 08/01/2020 in [place] No Risk No Risk No Risk        Assessment and Plan: This patient is a 65 year old female with a history of depression and anxiety.  She is still stressed regarding her upcoming trial regarding money owed.  Nevertheless she feels that the medications have helped her depression and anxiety.  She will continue Cymbalta 60 mg twice daily for depression and clonazepam 1 mg 3 times daily for anxiety.  She will return to see me in 3 months   [name], MD 10/29/2020, 1:32 PM

## 2020-11-08 ENCOUNTER — Other Ambulatory Visit: Payer: Self-pay | Admitting: Cardiology

## 2020-11-08 ENCOUNTER — Other Ambulatory Visit: Payer: Self-pay | Admitting: Family Medicine

## 2020-11-08 DIAGNOSIS — J439 Emphysema, unspecified: Secondary | ICD-10-CM

## 2020-11-08 DIAGNOSIS — M545 Low back pain, unspecified: Secondary | ICD-10-CM | POA: Diagnosis not present

## 2020-11-08 DIAGNOSIS — G43909 Migraine, unspecified, not intractable, without status migrainosus: Secondary | ICD-10-CM | POA: Diagnosis not present

## 2020-11-08 DIAGNOSIS — R2689 Other abnormalities of gait and mobility: Secondary | ICD-10-CM | POA: Diagnosis not present

## 2020-11-08 DIAGNOSIS — R296 Repeated falls: Secondary | ICD-10-CM | POA: Diagnosis not present

## 2020-11-08 DIAGNOSIS — M25512 Pain in left shoulder: Secondary | ICD-10-CM | POA: Diagnosis not present

## 2020-11-16 DIAGNOSIS — M6281 Muscle weakness (generalized): Secondary | ICD-10-CM | POA: Diagnosis not present

## 2020-12-11 ENCOUNTER — Other Ambulatory Visit: Payer: Self-pay | Admitting: Family Medicine

## 2020-12-12 ENCOUNTER — Ambulatory Visit: Payer: Medicare Other | Admitting: Pulmonary Disease

## 2020-12-16 DIAGNOSIS — M6281 Muscle weakness (generalized): Secondary | ICD-10-CM | POA: Diagnosis not present

## 2020-12-24 DIAGNOSIS — L99 Other disorders of skin and subcutaneous tissue in diseases classified elsewhere: Secondary | ICD-10-CM | POA: Diagnosis not present

## 2020-12-24 DIAGNOSIS — D485 Neoplasm of uncertain behavior of skin: Secondary | ICD-10-CM | POA: Diagnosis not present

## 2020-12-24 DIAGNOSIS — C4441 Basal cell carcinoma of skin of scalp and neck: Secondary | ICD-10-CM | POA: Diagnosis not present

## 2020-12-31 ENCOUNTER — Ambulatory Visit (INDEPENDENT_AMBULATORY_CARE_PROVIDER_SITE_OTHER): Payer: Medicare Other | Admitting: Dermatology

## 2020-12-31 ENCOUNTER — Other Ambulatory Visit: Payer: Self-pay

## 2020-12-31 ENCOUNTER — Encounter: Payer: Self-pay | Admitting: Dermatology

## 2020-12-31 DIAGNOSIS — L570 Actinic keratosis: Secondary | ICD-10-CM | POA: Diagnosis not present

## 2020-12-31 DIAGNOSIS — D485 Neoplasm of uncertain behavior of skin: Secondary | ICD-10-CM

## 2020-12-31 DIAGNOSIS — D045 Carcinoma in situ of skin of trunk: Secondary | ICD-10-CM

## 2021-01-07 DIAGNOSIS — H2513 Age-related nuclear cataract, bilateral: Secondary | ICD-10-CM | POA: Diagnosis not present

## 2021-01-07 DIAGNOSIS — H02834 Dermatochalasis of left upper eyelid: Secondary | ICD-10-CM | POA: Diagnosis not present

## 2021-01-07 DIAGNOSIS — H02831 Dermatochalasis of right upper eyelid: Secondary | ICD-10-CM | POA: Diagnosis not present

## 2021-01-10 ENCOUNTER — Other Ambulatory Visit: Payer: Self-pay | Admitting: Family Medicine

## 2021-01-10 ENCOUNTER — Other Ambulatory Visit (HOSPITAL_COMMUNITY): Payer: Self-pay | Admitting: Psychiatry

## 2021-01-10 DIAGNOSIS — F332 Major depressive disorder, recurrent severe without psychotic features: Secondary | ICD-10-CM

## 2021-01-10 DIAGNOSIS — J439 Emphysema, unspecified: Secondary | ICD-10-CM

## 2021-01-15 ENCOUNTER — Encounter: Payer: Self-pay | Admitting: Dermatology

## 2021-01-15 DIAGNOSIS — M6281 Muscle weakness (generalized): Secondary | ICD-10-CM | POA: Diagnosis not present

## 2021-01-15 NOTE — Progress Notes (Signed)
   Follow-Up Visit   Subjective  [name] is a 65 y.o. female who presents for the following: Follow-up (Pink scale on the tip of the nose, right chest tender to touch).  Tender new growth chest plus crust on tip of nose Location:  Duration:  Quality:  Associated Signs/Symptoms: Modifying Factors:  Severity:  Timing: Context:   Objective  Well appearing patient in no apparent distress; mood and affect are within normal limits. Mid Tip of Nose Gritty 4 mm pink crust  Chest - Medial (Center) Verrucous waxy 1.4 cm crust, possible superficial carcinoma.         Head, neck, back, chest, arms examined.   Assessment & Plan    AK (actinic keratosis) Mid Tip of Nose  Destruction of lesion - Mid Tip of Nose Complexity: simple   Destruction method: cryotherapy   Informed consent: discussed and consent obtained   Timeout:  patient name, date of birth, surgical site, and procedure verified Lesion destroyed using liquid nitrogen: Yes   Cryotherapy cycles:  3 Outcome: patient tolerated procedure well with no complications   Post-procedure details: wound care instructions given    Carcinoma in situ of skin of trunk Chest - Medial (Center)  Skin / nail biopsy Type of biopsy: tangential   Informed consent: discussed and consent obtained   Timeout: patient name, date of birth, surgical site, and procedure verified   Anesthesia: the lesion was anesthetized in a standard fashion   Anesthetic:  1% lidocaine w/ epinephrine 1-100,000 local infiltration Instrument used: flexible razor blade   Hemostasis achieved with: ferric subsulfate   Outcome: patient tolerated procedure well   Post-procedure details: wound care instructions given    Destruction of lesion Complexity: simple   Destruction method: electrodesiccation and curettage   Informed consent: discussed and consent obtained   Timeout:  patient name, date of birth, surgical site, and procedure  verified Anesthesia: the lesion was anesthetized in a standard fashion   Anesthetic:  1% lidocaine w/ epinephrine 1-100,000 local infiltration Curettage performed in three different directions: Yes   Curettage cycles:  3 Lesion length (cm):  2 Lesion width (cm):  2 Margin per side (cm):  0 Final wound size (cm):  2 Hemostasis achieved with:  aluminum chloride Outcome: patient tolerated procedure well with no complications   Post-procedure details: wound care instructions given    Specimen 1 - Surgical pathology Differential Diagnosis: scc vs bcc  Check Margins: No  After shave biopsy the base of the lesion was treated with curettage plus cautery      I, [name], MD, have reviewed all documentation for this visit.  The documentation on 01/15/21 for the exam, diagnosis, procedures, and orders are all accurate and complete.

## 2021-01-17 DIAGNOSIS — M542 Cervicalgia: Secondary | ICD-10-CM | POA: Diagnosis not present

## 2021-01-17 DIAGNOSIS — M545 Low back pain, unspecified: Secondary | ICD-10-CM | POA: Diagnosis not present

## 2021-01-17 DIAGNOSIS — G43909 Migraine, unspecified, not intractable, without status migrainosus: Secondary | ICD-10-CM | POA: Diagnosis not present

## 2021-01-17 DIAGNOSIS — R202 Paresthesia of skin: Secondary | ICD-10-CM | POA: Diagnosis not present

## 2021-01-29 ENCOUNTER — Other Ambulatory Visit: Payer: Self-pay

## 2021-01-29 ENCOUNTER — Encounter (HOSPITAL_COMMUNITY): Payer: Self-pay | Admitting: Psychiatry

## 2021-01-29 ENCOUNTER — Telehealth (INDEPENDENT_AMBULATORY_CARE_PROVIDER_SITE_OTHER): Payer: Medicare Other | Admitting: Psychiatry

## 2021-01-29 DIAGNOSIS — F332 Major depressive disorder, recurrent severe without psychotic features: Secondary | ICD-10-CM | POA: Diagnosis not present

## 2021-01-29 DIAGNOSIS — F411 Generalized anxiety disorder: Secondary | ICD-10-CM | POA: Diagnosis not present

## 2021-01-29 MED ORDER — BUPROPION HCL ER (XL) 300 MG PO TB24: 300.0000 mg | ORAL_TABLET | Freq: Every day | ORAL | 1 refills | Status: DC

## 2021-01-29 MED ORDER — CLONAZEPAM 1 MG PO TABS: 1.0000 mg | ORAL_TABLET | Freq: Three times a day (TID) | ORAL | 2 refills | Status: DC | PRN

## 2021-01-29 MED ORDER — DULOXETINE HCL 60 MG PO CPEP: 60.0000 mg | ORAL_CAPSULE | Freq: Two times a day (BID) | ORAL | 2 refills | Status: DC

## 2021-01-29 NOTE — Progress Notes (Signed)
Virtual Visit via Telephone Note  I connected with [name] on 01/29/21 at  2:00 PM EST by telephone and verified that I am speaking with the correct person using two identifiers.  Location: Patient: home Provider: office   I discussed the limitations, risks, security and privacy concerns of performing an evaluation and management service by telephone and the availability of in person appointments. I also discussed with the patient that there may be a patient responsible charge related to this service. The patient expressed understanding and agreed to proceed.      I discussed the assessment and treatment plan with the patient. The patient was provided an opportunity to ask questions and all were answered. The patient agreed with the plan and demonstrated an understanding of the instructions.   The patient was advised to call back or seek an in-person evaluation if the symptoms worsen or if the condition fails to improve as anticipated.  I provided 14 minutes of non-face-to-face time during this encounter.   Kerry Spiller, MD  [facility] [place] MD/PA/NP OP Progress Note  01/29/2021 2:17 PM [name]  MRN:  [id number]  Chief Complaint:  Chief Complaint   Depression; Anxiety; Follow-up    HPI: This patient is a 65 year old female who is widowed and lives alone in [place].  She is on disability.  She returns after 3 months regarding depression and anxiety  The patient reports that she is still waiting on a court date regarding her dispute with her landlord.  This is gone on for over a year now.  The final date is next month and she hopes things will get resolved.  She states that the whole thing "tears up my nerves."  Overall however her mood has been stable.  She denies significant anxiety or severe depression or suicidal ideation.  She is sleeping fairly well.  She is still working on smoking cessation.  She still has problems with balance and recently had another fall.  She is going to be  seeing her primary doctor about this. Visit Diagnosis:    ICD-10-CM   1. GAD (generalized anxiety disorder)  F41.1 clonazePAM (KLONOPIN) 1 MG tablet    2. Severe episode of recurrent major depressive disorder, without psychotic features ([place])  F33.2 DULoxetine (CYMBALTA) 60 MG capsule      Past Psychiatric History: none  Past Medical History:  Past Medical History:  Diagnosis Date   Allergy    Arthritis    Atypical mole 06/05/2003   slight-mod-Left scapula (WS)   Atypical nevus 07/26/2003   persist. dysp- Left scapula (WS)   Basal cell carcinoma 06/05/2003   RIght chest (CX35FU)   Basal cell carcinoma 05/22/2004   superificial- RIght upper back- (CX35FU)   Basal cell carcinoma 11/27/2004   beside R nostril-(MOHS), below right outer nose (MOHS)   Basal cell carcinoma 11/08/2009   left post shoulder -(txpbx)   Basal cell carcinoma 05/24/2012   ulcerated- Left post shoulder- (CX35FU), ulcerated-mid forehead (EXC )   Basal cell carcinoma 06/24/2012   superificial- Right shoulder - (txpbx)    Basal cell carcinoma 07/29/2012    sclerosis- mid forehead (MOHS)   Basal cell carcinoma 06/06/2019   nod-right anterior neck (CX35FU)   Cataract    Constipation    uses OTC meds with help   COPD (chronic obstructive pulmonary disease) ([place])    Coronary artery disease    minimal nonobstructive    COVID    Depression    GERD (gastroesophageal reflux disease)  Heart murmur    Hx of adenomatous polyp of colon 10/03/2014   Hypercholesteremia    Hypertension    Panic attacks    Perimenopausal    Skin cancer    Squamous cell carcinoma of skin 06/05/2003   Right [place](CX35FU)   Squamous cell carcinoma of skin 05/22/2004   in situ- Left upperarm (CX35FU)   Squamous cell carcinoma of skin 11/27/2004   in situ- above Left elbow (CX35FU)   Squamous cell carcinoma of skin 03/04/2006   in situ- Left sideburn (CX35FU)   Squamous cell carcinoma of skin 11/26/2006   in situ- mid brow  (Cx35FU)   Squamous cell carcinoma of skin 05/24/2012   in situ- Left chest- (CX35FU)   Squamous cell carcinoma of skin 04/05/2013   in situ- Left nose (Cx35FU)   Squamous cell carcinoma of skin 08/17/2013   well diff-above Left eye (txpbx)   Squamous cell carcinoma of skin 07/30/2016   in situ- right chest sup (Cx35FU)   Squamous cell carcinoma of skin 05/12/2017   in situ- RIght shoulder (CX35FU), in situ- Left forehead (CX35FU)   Squamous cell carcinoma of skin 11/23/2018   in situ- Right mid shin, inner- (MOHS)   Tobacco user     Past Surgical History:  Procedure Laterality Date   ABDOMINAL HYSTERECTOMY     COLONOSCOPY     Right foot toe surgery     UPPER GASTROINTESTINAL ENDOSCOPY     vocal cord     polyp removal    Family Psychiatric History: see below  Family History:  Family History  Problem Relation Age of Onset   Heart disease Mother    Heart attack Mother    Heart attack Father    Heart attack Brother    Colon cancer Brother 62       died at age 72   Anxiety disorder Brother    Depression Brother    Alcohol abuse Brother    Colon polyps Brother    Colon polyps Brother    Colon polyps Sister    Esophageal cancer Sister    Anxiety disorder Sister    Depression Sister    Colon polyps Sister    Drug abuse Son    Rectal cancer Neg Hx    Stomach cancer Neg Hx     Social History:  Social History   Socioeconomic History   Marital status: Single    Spouse name: Not on file   Number of children: 2   Years of education: Not on file   Highest education level: Not on file  Occupational History   Occupation: Scientist, 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   Sexual activity: Not on file  Other Topics Concern   Not on file  Social History Narrative   Single   Smoker   No alcohol or drug use reported         Social Determinants of Health  Financial Resource Strain: Not on file  Food Insecurity: Not on file  Transportation Needs: Not on file  Physical Activity: Not on file  Stress: Not on file  Social Connections: Not on file    Allergies:  Allergies  Allergen Reactions   Lorcet 10-650 [Hydrocodone-Acetaminophen] Itching   Sulfa Antibiotics Other (See Comments)    aching   Tape Other (See Comments)    Please use paper tape    Metabolic Disorder Labs: Lab Results  Component Value Date   HGBA1C 5.9 (H) 07/12/2013   MPG 123 (H) 07/12/2013   No results found for: PROLACTIN Lab Results  Component Value Date   CHOL 172 08/28/2020   TRIG 152 (H) 08/28/2020   HDL 50 08/28/2020   CHOLHDL 3.4 08/28/2020   VLDL 36 07/13/2013   LDLCALC 96 08/28/2020   LDLCALC 109 (H) 02/28/2020   Lab Results  Component Value Date   TSH 1.820 05/25/2017   TSH 1.080 05/17/2015    Therapeutic Level Labs: No results found for: LITHIUM No results found for: VALPROATE No components found for:  CBMZ  Current Medications: Current Outpatient Medications  Medication Sig Dispense Refill   albuterol (VENTOLIN HFA) 108 (90 Base) MCG/ACT inhaler Inhale 2 puffs into the lungs every 6 (six) hours as needed for wheezing or shortness of breath. 8 g 5   amLODipine (NORVASC) 10 MG tablet TAKE ONE TABLET BY MOUTH DAILY 90 tablet 0   Ascorbic Acid (VITAMIN C) 1000 MG tablet Take 1,000 mg by mouth daily.     aspirin EC 81 MG tablet Take 81 mg by mouth daily.     atorvastatin (LIPITOR) 40 MG tablet Take 1 tablet (40 mg total) by mouth at bedtime. 90 tablet 1   bacitracin ointment Apply 1 application topically 2 (two) times daily.  Apply to the abrasion twice daily 28 g 0   budesonide-formoterol (SYMBICORT) 160-4.5 MCG/ACT inhaler INHALE TWO PUFFS INTO THE LUNGS TWICE DAILY 10.2 g 2   buPROPion (WELLBUTRIN XL) 300 MG 24 hr tablet Take 1 tablet (300 mg total) by mouth daily. 90 tablet 1   cholecalciferol (VITAMIN D3) 25 MCG (1000 UNIT) tablet Take 1,000 Units by mouth daily.     clonazePAM (KLONOPIN) 1 MG tablet Take 1 tablet (1 mg total) by mouth 3 (three) times daily as needed. for anxiety 90 tablet 2   cloNIDine (CATAPRES) 0.1 MG tablet TAKE ONE TABLET BY MOUTH AT BEDTIME 90 tablet 0   dexlansoprazole (DEXILANT) 60 MG capsule TAKE ONE CAPSULE BY MOUTH DAILY ON AN EMPTY STOMACH, DO NOT EAT FOR ONE HOUR 90 capsule 0   DULoxetine (CYMBALTA) 60 MG capsule Take 1 capsule (60 mg total) by mouth 2 (two) times daily. 60 capsule 2   fexofenadine (ALLEGRA) 180 MG tablet Take 1 tablet (180 mg total) by mouth daily. For allergy symptoms 30 tablet 11   fish oil-omega-3 fatty acids 1000 MG capsule Take 1 g by mouth daily.      gabapentin (NEURONTIN) 300 MG capsule Take 300 mg by mouth 3 (three) times daily.     ipratropium-albuterol (DUONEB) 0.5-2.5 (3) MG/3ML SOLN Take 3 mLs by nebulization every 6 (six) hours as needed. 360 mL 5   lisinopril (ZESTRIL) 20 MG tablet Take 1 tablet (20 mg total) by mouth daily. 90 tablet 1   magic mouthwash (nystatin, lidocaine, diphenhydrAMINE, alum & mag hydroxide) suspension Swish and spit 5 mLs 4 (four) times daily as needed for mouth pain. 180 mL 1   meloxicam (  MOBIC) 15 MG tablet Take 15 mg by mouth daily.     nicotine (NICODERM CQ - DOSED IN MG/24 HOURS) 21 mg/24hr patch Place 1 patch (21 mg total) onto the skin daily. 28 patch 0   oxyCODONE (ROXICODONE) 15 MG immediate release tablet Take 15 mg by mouth 4 (four) times daily as needed.     pantoprazole (PROTONIX) 40 MG tablet Take 1 tablet (40 mg total) by mouth 2 (two) times daily. 180 tablet 1   predniSONE (DELTASONE) 10 MG tablet Take 60mg  x 2  days, then 50mg  x 2 days, then 40mg  x 2 days, then 30mg  x 2 days, then 20mg  x 2 days, then 10mg  x 2 days, then stop. 42 tablet 0   SPIRIVA RESPIMAT 2.5 MCG/ACT AERS INHALE TWO PUFFS INTO THE LUNGS DAILY 4 g 2   sucralfate (CARAFATE) 1 g tablet TAKE ONE TABLET BY MOUTH TWICE DAILY 60 tablet 0   Vitamin D, Ergocalciferol, (DRISDOL) 1.25 MG (50000 UNIT) CAPS capsule Take 1 capsule (50,000 Units total) by mouth every 7 (seven) days. 13 capsule 3   No current facility-administered medications for this visit.     Musculoskeletal: Strength & Muscle Tone: na Gait & Station: na Patient leans: N/A  Psychiatric Specialty Exam: Review of Systems  Respiratory:  Positive for shortness of breath.   Musculoskeletal:  Positive for back pain and gait problem.  All other systems reviewed and are negative.  There were no vitals taken for this visit.There is no height or weight on file to calculate BMI.  General Appearance: NA  Eye Contact:  NA  Speech:  Clear and Coherent  Volume:  Normal  Mood:  Euthymic  Affect:  NA  Thought Process:  Goal Directed  Orientation:  Full (Time, Place, and Person)  Thought Content: Rumination   Suicidal Thoughts:  No  Homicidal Thoughts:  No  Memory:  Immediate;   Good Recent;   Good Remote;   Fair  Judgement:  Fair  Insight:  Fair  Psychomotor Activity:  Decreased  Concentration:  Concentration: Good and Attention Span: Good  Recall:  Good  Fund of Knowledge: Good  Language: Good  Akathisia:  No  Handed:  Right  AIMS (if indicated): not done  Assets:  Communication Skills Desire for Improvement Resilience Social Support Talents/Skills  ADL's:  Intact  Cognition: WNL  Sleep:  Good   Screenings: GAD-7    Flowsheet Row Office Visit from 08/28/2020 in [facility] from 11/20/2015 in [facility] Office Visit from 02/09/2015 in [facility]  Total GAD-7 Score 7  9 21       PHQ2-9    Flowsheet Row Video Visit from 01/29/2021 in [facility] Video Visit from 10/29/2020 in [facility] Office Visit from 08/28/2020 in [facility] Video Visit from 08/01/2020 in [facility] from 04/02/2020 in [facility] [facility]  PHQ-2 Total Score 2 1 5 4 4   PHQ-9 Total Score 5 -- 14 11 10       Flowsheet Row Video Visit from 01/29/2021 in [facility] Video Visit from 10/29/2020 in [facility] from 10/04/2020 in [place] No Risk No Risk No Risk        Assessment and Plan: This patient is a 65 year old female with a history of depression and anxiety.  She continues to  stay stressed regarding the court hearing but there is not much she can do about it.  She does feel that the medications have helped her depression and anxiety.  She will continue Cymbalta 60 mg twice daily for depression clonazepam 1 mg 3 times daily for anxiety and Wellbutrin XL 300 mg daily for depression and smoking cessation.  She will return to see me in 3 months   Kerry Spiller, MD 01/29/2021, 2:17 PM

## 2021-01-30 ENCOUNTER — Encounter: Payer: Self-pay | Admitting: Cardiology

## 2021-01-30 ENCOUNTER — Ambulatory Visit (INDEPENDENT_AMBULATORY_CARE_PROVIDER_SITE_OTHER): Payer: Medicare Other | Admitting: Cardiology

## 2021-01-30 VITALS — BP 110/82 | HR 95 | Resp 20 | Ht 65.0 in | Wt 161.0 lb

## 2021-01-30 DIAGNOSIS — I10 Essential (primary) hypertension: Secondary | ICD-10-CM | POA: Diagnosis not present

## 2021-01-30 DIAGNOSIS — I350 Nonrheumatic aortic (valve) stenosis: Secondary | ICD-10-CM | POA: Diagnosis not present

## 2021-01-30 DIAGNOSIS — R0789 Other chest pain: Secondary | ICD-10-CM | POA: Diagnosis not present

## 2021-01-30 DIAGNOSIS — Z23 Encounter for immunization: Secondary | ICD-10-CM

## 2021-01-30 DIAGNOSIS — E782 Mixed hyperlipidemia: Secondary | ICD-10-CM

## 2021-01-30 NOTE — Patient Instructions (Addendum)

## 2021-01-30 NOTE — Progress Notes (Signed)
Clinical Summary [name] is a 65 y.o.female seen today for follow up of the following medical problems.    1. Atypical chest pain/CAD - long history of atypical chest pain - cath 2008 nonobstrucive CAD - 07/2013 nuclear stress: no ischemia 01/2018 CT for lung cancer screening showed aortic atherosclerosis, with LM disease, LAD, and LCX      CAD risk factors: hyperlipidemia, HTN, +tobacco, both parents MIs in 63 to 44s, brother MI in his 68s   Jan 2020 nuclear stress no ischemia     - chronic nonspecific chest pains, SOB/DOE with walking. +wheezing - compliant with meds   2. COPD - followed by pulmonary       3. HTN - compliant with meds   4. Hyperlipidemia - compliant with atorvastatin  08/2020 TC 172 TG 152 HDL 50 LDL 96   5. Aortic stenosis - 01/2020 echo mild AS mean grad 12 AVA VTI 1.69 - needs repeats in 2024   Past Medical History:  Diagnosis Date   Allergy    Arthritis    Atypical mole 06/05/2003   slight-mod-Left scapula (WS)   Atypical nevus 07/26/2003   persist. dysp- Left scapula (WS)   Basal cell carcinoma 06/05/2003   RIght chest (CX35FU)   Basal cell carcinoma 05/22/2004   superificial- RIght upper back- (CX35FU)   Basal cell carcinoma 11/27/2004   beside R nostril-(MOHS), below right outer nose (MOHS)   Basal cell carcinoma 11/08/2009   left post shoulder -(txpbx)   Basal cell carcinoma 05/24/2012   ulcerated- Left post shoulder- (CX35FU), ulcerated-mid forehead (EXC )   Basal cell carcinoma 06/24/2012   superificial- Right shoulder - (txpbx)    Basal cell carcinoma 07/29/2012    sclerosis- mid forehead (MOHS)   Basal cell carcinoma 06/06/2019   nod-right anterior neck (CX35FU)   Cataract    Constipation    uses OTC meds with help   COPD (chronic obstructive pulmonary disease) ([place])    Coronary artery disease    minimal nonobstructive    COVID    Depression    GERD (gastroesophageal reflux disease)    Heart murmur    Hx of  adenomatous polyp of colon 10/03/2014   Hypercholesteremia    Hypertension    Panic attacks    Perimenopausal    Skin cancer    Squamous cell carcinoma of skin 06/05/2003   Right [place](CX35FU)   Squamous cell carcinoma of skin 05/22/2004   in situ- Left upperarm (CX35FU)   Squamous cell carcinoma of skin 11/27/2004   in situ- above Left elbow (CX35FU)   Squamous cell carcinoma of skin 03/04/2006   in situ- Left sideburn (CX35FU)   Squamous cell carcinoma of skin 11/26/2006   in situ- mid brow (Cx35FU)   Squamous cell carcinoma of skin 05/24/2012   in situ- Left chest- (CX35FU)   Squamous cell carcinoma of skin 04/05/2013   in situ- Left nose (Cx35FU)   Squamous cell carcinoma of skin 08/17/2013   well diff-above Left eye (txpbx)   Squamous cell carcinoma of skin 07/30/2016   in situ- right chest sup (Cx35FU)   Squamous cell carcinoma of skin 05/12/2017   in situ- RIght shoulder (CX35FU), in situ- Left forehead (CX35FU)   Squamous cell carcinoma of skin 11/23/2018   in situ- Right mid shin, inner- (MOHS)   Tobacco user      Allergies  Allergen Reactions   Lorcet 10-650 [Hydrocodone-Acetaminophen] Itching   Sulfa Antibiotics Other (See Comments)  aching   Tape Other (See Comments)    Please use paper tape     Current Outpatient Medications  Medication Sig Dispense Refill   albuterol (VENTOLIN HFA) 108 (90 Base) MCG/ACT inhaler Inhale 2 puffs into the lungs every 6 (six) hours as needed for wheezing or shortness of breath. 8 g 5   amLODipine (NORVASC) 10 MG tablet TAKE ONE TABLET BY MOUTH DAILY 90 tablet 0   Ascorbic Acid (VITAMIN C) 1000 MG tablet Take 1,000 mg by mouth daily.     aspirin EC 81 MG tablet Take 81 mg by mouth daily.     atorvastatin (LIPITOR) 40 MG tablet Take 1 tablet (40 mg total) by mouth at bedtime. 90 tablet 1   bacitracin ointment Apply 1 application topically 2 (two) times daily. Apply to the abrasion twice daily 28 g 0   budesonide-formoterol  (SYMBICORT) 160-4.5 MCG/ACT inhaler INHALE TWO PUFFS INTO THE LUNGS TWICE DAILY 10.2 g 2   buPROPion (WELLBUTRIN XL) 300 MG 24 hr tablet Take 1 tablet (300 mg total) by mouth daily. 90 tablet 1   cholecalciferol (VITAMIN D3) 25 MCG (1000 UNIT) tablet Take 1,000 Units by mouth daily.     clonazePAM (KLONOPIN) 1 MG tablet Take 1 tablet (1 mg total) by mouth 3 (three) times daily as needed. for anxiety 90 tablet 2   cloNIDine (CATAPRES) 0.1 MG tablet TAKE ONE TABLET BY MOUTH AT BEDTIME 90 tablet 0   dexlansoprazole (DEXILANT) 60 MG capsule TAKE ONE CAPSULE BY MOUTH DAILY ON AN EMPTY STOMACH, DO NOT EAT FOR ONE HOUR 90 capsule 0   DULoxetine (CYMBALTA) 60 MG capsule Take 1 capsule (60 mg total) by mouth 2 (two) times daily. 60 capsule 2   fexofenadine (ALLEGRA) 180 MG tablet Take 1 tablet (180 mg total) by mouth daily. For allergy symptoms 30 tablet 11   fish oil-omega-3 fatty acids 1000 MG capsule Take 1 g by mouth daily.      gabapentin (NEURONTIN) 300 MG capsule Take 300 mg by mouth 3 (three) times daily.     ipratropium-albuterol (DUONEB) 0.5-2.5 (3) MG/3ML SOLN Take 3 mLs by nebulization every 6 (six) hours as needed. 360 mL 5   lisinopril (ZESTRIL) 20 MG tablet Take 1 tablet (20 mg total) by mouth daily. 90 tablet 1   magic mouthwash (nystatin, lidocaine, diphenhydrAMINE, alum & mag hydroxide) suspension Swish and spit 5 mLs 4 (four) times daily as needed for mouth pain. 180 mL 1   meloxicam (MOBIC) 15 MG tablet Take 15 mg by mouth daily.     nicotine (NICODERM CQ - DOSED IN MG/24 HOURS) 21 mg/24hr patch Place 1 patch (21 mg total) onto the skin daily. 28 patch 0   oxyCODONE (ROXICODONE) 15 MG immediate release tablet Take 15 mg by mouth 4 (four) times daily as needed.     pantoprazole (PROTONIX) 40 MG tablet Take 1 tablet (40 mg total) by mouth 2 (two) times daily. 180 tablet 1   predniSONE (DELTASONE) 10 MG tablet Take 60mg  x 2 days, then 50mg  x 2 days, then 40mg  x 2 days, then 30mg  x 2 days,  then 20mg  x 2 days, then 10mg  x 2 days, then stop. 42 tablet 0   SPIRIVA RESPIMAT 2.5 MCG/ACT AERS INHALE TWO PUFFS INTO THE LUNGS DAILY 4 g 2   sucralfate (CARAFATE) 1 g tablet TAKE ONE TABLET BY MOUTH TWICE DAILY 60 tablet 0   Vitamin D, Ergocalciferol, (DRISDOL) 1.25 MG (50000 UNIT) CAPS capsule Take 1  capsule (50,000 Units total) by mouth every 7 (seven) days. 13 capsule 3   No current facility-administered medications for this visit.     Past Surgical History:  Procedure Laterality Date   ABDOMINAL HYSTERECTOMY     COLONOSCOPY     Right foot toe surgery     UPPER GASTROINTESTINAL ENDOSCOPY     vocal cord     polyp removal     Allergies  Allergen Reactions   Lorcet 10-650 [Hydrocodone-Acetaminophen] Itching   Sulfa Antibiotics Other (See Comments)    aching   Tape Other (See Comments)    Please use paper tape      Family History  Problem Relation Age of Onset   Heart disease Mother    Heart attack Mother    Heart attack Father    Heart attack Brother    Colon cancer Brother 48       died at age 66   Anxiety disorder Brother    Depression Brother    Alcohol abuse Brother    Colon polyps Brother    Colon polyps Brother    Colon polyps Sister    Esophageal cancer Sister    Anxiety disorder Sister    Depression Sister    Colon polyps Sister    Drug abuse Son    Rectal cancer Neg Hx    Stomach cancer Neg Hx      Social History [name] reports that she has been smoking cigarettes. She started smoking about 51 years ago. She has a 40.00 pack-year smoking history. She has never used smokeless tobacco. [name] reports no history of alcohol use.   Review of Systems CONSTITUTIONAL: No weight loss, fever, chills, weakness or fatigue.  HEENT: Eyes: No visual loss, blurred vision, double vision or yellow sclerae.No hearing loss, sneezing, congestion, runny nose or sore throat.  SKIN: No rash or itching.  CARDIOVASCULAR: per hpi RESPIRATORY: No shortness of  breath, cough or sputum.  GASTROINTESTINAL: No anorexia, nausea, vomiting or diarrhea. No abdominal pain or blood.  GENITOURINARY: No burning on urination, no polyuria NEUROLOGICAL: No headache, dizziness, syncope, paralysis, ataxia, numbness or tingling in the extremities. No change in bowel or bladder control.  MUSCULOSKELETAL: No muscle, back pain, joint pain or stiffness.  LYMPHATICS: No enlarged nodes. No history of splenectomy.  PSYCHIATRIC: No history of depression or anxiety.  ENDOCRINOLOGIC: No reports of sweating, cold or heat intolerance. No polyuria or polydipsia.  Marland Kitchen   Physical Examination Today's Vitals   01/30/21 1519  BP: 110/82  Pulse: 95  Resp: 20  SpO2: 97%  Weight: 161 lb (73 kg)  Height: 5\' 5"  (1.651 m)  PainSc: 0-No pain   Body mass index is 26.79 kg/m.  Gen: resting comfortably, no acute distress HEENT: no scleral icterus, pupils equal round and reactive, no palptable cervical adenopathy,  CV: RRR, 3/6 systolic murmur, no jvd Resp: Clear to auscultation bilaterally GI: abdomen is soft, non-tender, non-distended, normal bowel sounds, no hepatosplenomegaly MSK: extremities are warm, no edema.  Skin: warm, no rash Neuro:  no focal deficits Psych: appropriate affect   Diagnostic Studies  06/2006 cath FINDINGS:  Aortic pressure 125/78 with a mean of 99. Left ventricular  pressure 125/5 with an end-diastolic pressure of 10.    The left mainstem is angiographically normal.  It bifurcates into the  LAD and left circumflex.    The LAD is a large-caliber vessel that courses down to the LV apex.  It  gives off a first diagonal  Oree Mirelez and is a large vessel.  There is no  significant disease in the LAD or diagonal system.    Left circumflex is a large-caliber vessel.  There is a small  intermediate Keaten Mashek present.  There are really no substantial obtuse  marginal branches from the left circumflex present.  The left circumflex  courses down the AV groove and  gives off a large posterolateral Shonteria Abeln.  The mid portion of the left circumflex has a long segment of 30-40%  stenosis.    The right coronary artery is a large-caliber vessel.  It terminates  distally in a PDA and posterior AV segment which gives off two  posterolateral branches.  The mid portion of the right coronary artery  provides a small RV marginal Rainier Feuerborn, and the proximal portion has a  small conus Sitlaly Gudiel present.  There are minor luminal irregularities in  the proximal portion of the right coronary artery, but there is no  significant angiographic disease.    Left ventriculography performed in the 30-degree RAO projection  demonstrates normal left ventricular systolic function with an LVEF of  55%.  There is no mitral regurgitation.    ASSESSMENT:  1. Nonobstructive coronary artery disease, predominantly involving the      left circumflex system.  2. Normal left ventricular function.  3. Intracoronary vascular ultrasound demonstration of minimal plaque      in the right coronary artery.    PLAN:  [name] will require aggressive medical therapy.  She needs  tobacco cessation and progressive statin therapy.  She will receive  her statin therapy through the [facility] protocol.  She should be on a  daily aspirin as well.  She will be a candidate for early discharge as  her chest pain appears to have been noncardiac, and she has had no  further pain.     Jan 2020 nuclear stress There was no ST segment deviation noted during stress. Defect 1: There is a medium defect of mild severity present in the basal inferior, mid inferior and apical inferior location. This is likely due to soft tissue attenuation. No evidence of ischemia. This is a low risk study. Nuclear stress EF: 58%.   01/2020 echo IMPRESSIONS     1. Left ventricular ejection fraction, by estimation, is 60 to 65%. The  left ventricle has normal function. The left ventricle has no regional  wall motion  abnormalities. Left ventricular diastolic parameters are  consistent with Grade I diastolic  dysfunction (impaired relaxation).   2. Right ventricular systolic function is normal. The right ventricular  size is normal.   3. The mitral valve is normal in structure. No evidence of mitral valve  regurgitation. No evidence of mitral stenosis.   4. The aortic valve has an indeterminant number of cusps. There is mild  calcification of the aortic valve. There is mild thickening of the aortic  valve. Aortic valve regurgitation is mild. Mild aortic valve stenosis.  Mild aortic stenosis is present.  Aortic valve mean gradient measures 12.0 mmHg. Aortic valve peak gradient  measures 23.1 mmHg. Aortic valve area, by VTI measures 1.69 cm.   5. The inferior vena cava is normal in size with greater than 50%  respiratory variability, suggesting right atrial pressure of 3 mmHg.    Assessment and Plan   1. CAD/Atypical chest pain - long history of atypical chest pain - nonobstructive CAD by cath 2008, negative nuclear stress 2015 and more recently Jan 2020 -chronic symptoms unchanged, continue to monitor  2.Aortic stenosis - mild by recent echo - will repeat study in 2024     3. HTN - manual recheck 134/75. At other provider visits has essentially been right at goal - continue current meds   4. Hyperlipidemia - at goal, continue statin     Arnoldo Lenis, M.D.

## 2021-01-31 ENCOUNTER — Encounter: Payer: Self-pay | Admitting: Dermatology

## 2021-02-05 ENCOUNTER — Encounter (HOSPITAL_COMMUNITY): Payer: Self-pay

## 2021-02-05 ENCOUNTER — Emergency Department (HOSPITAL_COMMUNITY)
Admission: EM | Admit: 2021-02-05 | Discharge: 2021-02-05 | Disposition: A | Discharge status: Home / Self Care | Payer: Medicare Other | Attending: Emergency Medicine | Admitting: Emergency Medicine

## 2021-02-05 ENCOUNTER — Other Ambulatory Visit: Payer: Self-pay

## 2021-02-05 ENCOUNTER — Emergency Department (HOSPITAL_COMMUNITY): Payer: Medicare Other

## 2021-02-05 DIAGNOSIS — R079 Chest pain, unspecified: Secondary | ICD-10-CM | POA: Diagnosis not present

## 2021-02-05 DIAGNOSIS — F17210 Nicotine dependence, cigarettes, uncomplicated: Secondary | ICD-10-CM | POA: Diagnosis not present

## 2021-02-05 DIAGNOSIS — S34109A Unspecified injury to unspecified level of lumbar spinal cord, initial encounter: Secondary | ICD-10-CM | POA: Diagnosis present

## 2021-02-05 DIAGNOSIS — Z7982 Long term (current) use of aspirin: Secondary | ICD-10-CM | POA: Insufficient documentation

## 2021-02-05 DIAGNOSIS — Z85828 Personal history of other malignant neoplasm of skin: Secondary | ICD-10-CM | POA: Insufficient documentation

## 2021-02-05 DIAGNOSIS — J449 Chronic obstructive pulmonary disease, unspecified: Secondary | ICD-10-CM | POA: Insufficient documentation

## 2021-02-05 DIAGNOSIS — T148XXA Other injury of unspecified body region, initial encounter: Secondary | ICD-10-CM

## 2021-02-05 DIAGNOSIS — Z8616 Personal history of COVID-19: Secondary | ICD-10-CM | POA: Insufficient documentation

## 2021-02-05 DIAGNOSIS — I2510 Atherosclerotic heart disease of native coronary artery without angina pectoris: Secondary | ICD-10-CM | POA: Diagnosis not present

## 2021-02-05 DIAGNOSIS — Z79899 Other long term (current) drug therapy: Secondary | ICD-10-CM | POA: Insufficient documentation

## 2021-02-05 DIAGNOSIS — I10 Essential (primary) hypertension: Secondary | ICD-10-CM | POA: Insufficient documentation

## 2021-02-05 DIAGNOSIS — R000 Tachycardia, unspecified: Secondary | ICD-10-CM | POA: Insufficient documentation

## 2021-02-05 DIAGNOSIS — S39012A Strain of muscle, fascia and tendon of lower back, initial encounter: Secondary | ICD-10-CM | POA: Diagnosis not present

## 2021-02-05 DIAGNOSIS — Z7951 Long term (current) use of inhaled steroids: Secondary | ICD-10-CM | POA: Diagnosis not present

## 2021-02-05 DIAGNOSIS — X58XXXA Exposure to other specified factors, initial encounter: Secondary | ICD-10-CM | POA: Insufficient documentation

## 2021-02-05 DIAGNOSIS — R0789 Other chest pain: Secondary | ICD-10-CM | POA: Diagnosis not present

## 2021-02-05 DIAGNOSIS — R0781 Pleurodynia: Secondary | ICD-10-CM | POA: Diagnosis not present

## 2021-02-05 DIAGNOSIS — S29011A Strain of muscle and tendon of front wall of thorax, initial encounter: Secondary | ICD-10-CM | POA: Diagnosis not present

## 2021-02-05 LAB — COMPREHENSIVE METABOLIC PANEL
ALT: 17 U/L (ref 0–44)
AST: 10 U/L — ABNORMAL LOW (ref 15–41)
Albumin: 3.5 g/dL (ref 3.5–5.0)
Alkaline Phosphatase: 67 U/L (ref 38–126)
Anion gap: 9 (ref 5–15)
BUN: 21 mg/dL (ref 8–23)
CO2: 26 mmol/L (ref 22–32)
Calcium: 8.9 mg/dL (ref 8.9–10.3)
Chloride: 105 mmol/L (ref 98–111)
Creatinine, Ser: 0.72 mg/dL (ref 0.44–1.00)
GFR, Estimated: >60 mL/min (ref >60)
Glucose, Bld: 97 mg/dL (ref 70–99)
Potassium: 3.6 mmol/L (ref 3.5–5.1)
Sodium: 140 mmol/L (ref 135–145)
Total Bilirubin: 0.3 mg/dL (ref 0.3–1.2)
Total Protein: 6.1 g/dL — ABNORMAL LOW (ref 6.5–8.1)

## 2021-02-05 LAB — CBC WITH DIFFERENTIAL/PLATELET
Abs Immature Granulocytes: 0.14 10*3/uL — ABNORMAL HIGH (ref 0.00–0.07)
Basophils Absolute: 0 10*3/uL (ref 0.0–0.1)
Basophils Relative: 0 %
Eosinophils Absolute: 0.1 10*3/uL (ref 0.0–0.5)
Eosinophils Relative: 1 %
HCT: 40.3 % (ref 36.0–46.0)
Hemoglobin: 13.4 g/dL (ref 12.0–15.0)
Immature Granulocytes: 1 %
Lymphocytes Relative: 12 %
Lymphs Abs: 1.5 10*3/uL (ref 0.7–4.0)
MCH: 31.8 pg (ref 26.0–34.0)
MCHC: 33.3 g/dL (ref 30.0–36.0)
MCV: 95.5 fL (ref 80.0–100.0)
Monocytes Absolute: 1 10*3/uL (ref 0.1–1.0)
Monocytes Relative: 8 %
Neutro Abs: 10.2 10*3/uL — ABNORMAL HIGH (ref 1.7–7.7)
Neutrophils Relative %: 78 %
Platelets: 284 10*3/uL (ref 150–400)
RBC: 4.22 MIL/uL (ref 3.87–5.11)
RDW: 15.5 % (ref 11.5–15.5)
WBC: 13 10*3/uL — ABNORMAL HIGH (ref 4.0–10.5)
nRBC: 0 % (ref 0.0–0.2)

## 2021-02-05 LAB — TROPONIN I (HIGH SENSITIVITY)
Troponin I (High Sensitivity): 7 ng/L (ref <18)
Troponin I (High Sensitivity): 8 ng/L (ref <18)

## 2021-02-05 MED ORDER — METHOCARBAMOL 500 MG PO TABS: 500.0000 mg | ORAL_TABLET | Freq: Two times a day (BID) | ORAL | 0 refills | Status: DC

## 2021-02-05 MED ORDER — KETOROLAC TROMETHAMINE 30 MG/ML IJ SOLN
30.0000 mg | Freq: Once | INTRAMUSCULAR | Status: AC
  Administered 2021-02-05: 30 mg via INTRAMUSCULAR
  Filled 2021-02-05: qty 1

## 2021-02-05 NOTE — ED Triage Notes (Signed)
Pt reports she pulled her sister in law in the bed sat and hurt left ribs and left upper back.  Says hurts to move and to take a deep breath.

## 2021-02-05 NOTE — ED Provider Notes (Signed)
[facility] EMERGENCY DEPARTMENT Provider Note   CSN: [id number] Arrival date & time: 02/05/21  0935     History Chief Complaint  Patient presents with   Back Pain    [name] is a 65 y.o. female.  With past medical history of COPD, GERD, CAD, hypertension, chronic low back pain who presents to the emergency department with left-sided back pain.  She states that Saturday evening she was moving her sister up in bed.  She states that by Sunday evening she started feeling pain in her left back and ribs.  She states that she used her heating pad which provided no relief.  She also has chronic opioids at home which she states provided no relief.  She denies falling or other trauma to her left side.  She does say it is tender to palpation.  While I was in the room the patient began having left-sided chest pain under her breast.  She denies having any shortness of breath with the pain, nausea, patient is not diaphoretic.   Back Pain Associated symptoms: chest pain   Associated symptoms: no fever       Past Medical History:  Diagnosis Date   Allergy    Arthritis    Atypical mole 06/05/2003   slight-mod-Left scapula (WS)   Atypical nevus 07/26/2003   persist. dysp- Left scapula (WS)   Basal cell carcinoma 06/05/2003   RIght chest (CX35FU)   Basal cell carcinoma 05/22/2004   superificial- RIght upper back- (CX35FU)   Basal cell carcinoma 11/27/2004   beside R nostril-(MOHS), below right outer nose (MOHS)   Basal cell carcinoma 11/08/2009   left post shoulder -(txpbx)   Basal cell carcinoma 05/24/2012   ulcerated- Left post shoulder- (CX35FU), ulcerated-mid forehead (EXC )   Basal cell carcinoma 06/24/2012   superificial- Right shoulder - (txpbx)    Basal cell carcinoma 07/29/2012    sclerosis- mid forehead (MOHS)   Basal cell carcinoma 06/06/2019   nod-right anterior neck (CX35FU)   Cataract    Constipation    uses OTC meds with help   COPD (chronic obstructive  pulmonary disease) ([place])    Coronary artery disease    minimal nonobstructive    COVID    Depression    GERD (gastroesophageal reflux disease)    Heart murmur    Hx of adenomatous polyp of colon 10/03/2014   Hypercholesteremia    Hypertension    Panic attacks    Perimenopausal    Skin cancer    Squamous cell carcinoma of skin 06/05/2003   Right [place](CX35FU)   Squamous cell carcinoma of skin 05/22/2004   in situ- Left upperarm (CX35FU)   Squamous cell carcinoma of skin 11/27/2004   in situ- above Left elbow (CX35FU)   Squamous cell carcinoma of skin 03/04/2006   in situ- Left sideburn (CX35FU)   Squamous cell carcinoma of skin 11/26/2006   in situ- mid brow (Cx35FU)   Squamous cell carcinoma of skin 05/24/2012   in situ- Left chest- (CX35FU)   Squamous cell carcinoma of skin 04/05/2013   in situ- Left nose (Cx35FU)   Squamous cell carcinoma of skin 08/17/2013   well diff-above Left eye (txpbx)   Squamous cell carcinoma of skin 07/30/2016   in situ- right chest sup (Cx35FU)   Squamous cell carcinoma of skin 05/12/2017   in situ- RIght shoulder (CX35FU), in situ- Left forehead (CX35FU)   Squamous cell carcinoma of skin 11/23/2018   in situ- Right mid shin, inner- (MOHS)  Tobacco user     Patient Active Problem List   Diagnosis Date Noted   Lumbar herniated disc 12/10/2018   Major depression 04/02/2015   Benzodiazepine abuse, continuous ([place]) 01/22/2015   Opiate dependence ([place]) 01/22/2015   Hx of adenomatous polyp of colon 10/03/2014   Vitamin D deficiency 05/08/2014   COPD (chronic obstructive pulmonary disease) ([place]) 05/06/2011   Hyperlipidemia 05/05/2011   INSOMNIA 11/13/2008   TOBACCO ABUSE 11/10/2008   Essential hypertension 11/10/2008   Osteoarthritis 11/10/2008    Past Surgical History:  Procedure Laterality Date   ABDOMINAL HYSTERECTOMY     COLONOSCOPY     Right foot toe surgery     UPPER GASTROINTESTINAL ENDOSCOPY     vocal cord     polyp removal      OB History   No obstetric history on file.     Family History  Problem Relation Age of Onset   Heart disease Mother    Heart attack Mother    Heart attack Father    Heart attack Brother    Colon cancer Brother 35       died at age 33   Anxiety disorder Brother    Depression Brother    Alcohol abuse Brother    Colon polyps Brother    Colon polyps Brother    Colon polyps Sister    Esophageal cancer Sister    Anxiety disorder Sister    Depression Sister    Colon polyps Sister    Drug abuse Son    Rectal cancer Neg Hx    Stomach cancer Neg Hx     Social History   Tobacco Use   Smoking status: Every Day    Packs/day: 1.00    Years: 40.00    Pack years: 40.00    Types: Cigarettes    Start date: 05/01/1969   Smokeless tobacco: Never  Vaping Use   Vaping Use: Never used  Substance Use Topics   Alcohol use: No    Alcohol/week: 0.0 standard drinks   Drug use: No    Comment: per pt, Cocaine was in her system October and November 2016 when she went to the pain clinic 04-02-15.    Home Medications Prior to Admission medications   Medication Sig Start Date End Date Taking? Authorizing Provider  albuterol (VENTOLIN HFA) 108 (90 Base) MCG/ACT inhaler Inhale 2 puffs into the lungs every 6 (six) hours as needed for wheezing or shortness of breath. 07/11/20   Mannam, Hart Robinsons, MD  amLODipine (NORVASC) 10 MG tablet TAKE ONE TABLET BY MOUTH DAILY 11/08/20   Arnoldo Lenis, MD  Ascorbic Acid (VITAMIN C) 1000 MG tablet Take 1,000 mg by mouth daily.    [provider]  aspirin EC 81 MG tablet Take 81 mg by mouth daily.    [provider]  atorvastatin (LIPITOR) 40 MG tablet Take 1 tablet (40 mg total) by mouth at bedtime. 08/28/20   Claretta Fraise, MD  budesonide-formoterol (SYMBICORT) 160-4.5 MCG/ACT inhaler INHALE TWO PUFFS INTO THE LUNGS TWICE DAILY 11/09/20   Claretta Fraise, MD  buPROPion (WELLBUTRIN XL) 300 MG 24 hr tablet Take 1 tablet (300 mg total) by mouth  daily. 01/29/21   Cloria Spring, MD  cholecalciferol (VITAMIN D3) 25 MCG (1000 UNIT) tablet Take 1,000 Units by mouth daily.    [provider]  clonazePAM (KLONOPIN) 1 MG tablet Take 1 tablet (1 mg total) by mouth 3 (three) times daily as needed. for anxiety 01/29/21   Harrington Challenger,  Su Ley, MD  cloNIDine (CATAPRES) 0.1 MG tablet TAKE ONE TABLET BY MOUTH AT BEDTIME 12/11/20   Claretta Fraise, MD  dexlansoprazole (DEXILANT) 60 MG capsule TAKE ONE CAPSULE BY MOUTH DAILY ON AN EMPTY STOMACH, DO NOT EAT FOR ONE HOUR 12/11/20   Claretta Fraise, MD  DULoxetine (CYMBALTA) 60 MG capsule Take 1 capsule (60 mg total) by mouth 2 (two) times daily. 01/29/21   Cloria Spring, MD  fexofenadine (ALLEGRA) 180 MG tablet Take 1 tablet (180 mg total) by mouth daily. For allergy symptoms 02/28/20   Claretta Fraise, MD  gabapentin (NEURONTIN) 300 MG capsule Take 300 mg by mouth 3 (three) times daily. 06/15/18   [provider]  ipratropium-albuterol (DUONEB) 0.5-2.5 (3) MG/3ML SOLN Take 3 mLs by nebulization every 6 (six) hours as needed. 02/28/20   Claretta Fraise, MD  lisinopril (ZESTRIL) 20 MG tablet Take 1 tablet (20 mg total) by mouth daily. 08/28/20   Claretta Fraise, MD  magic mouthwash (nystatin, lidocaine, diphenhydrAMINE, alum & mag hydroxide) suspension Swish and spit 5 mLs 4 (four) times daily as needed for mouth pain. 10/15/20   Claretta Fraise, MD  meloxicam (MOBIC) 15 MG tablet Take 15 mg by mouth daily. 11/26/19   [provider]  oxyCODONE (ROXICODONE) 15 MG immediate release tablet Take 15 mg by mouth 4 (four) times daily as needed. 03/08/20   [provider]  pantoprazole (PROTONIX) 40 MG tablet Take 1 tablet (40 mg total) by mouth 2 (two) times daily. 08/28/20   Claretta Fraise, MD  predniSONE (DELTASONE) 10 MG tablet Take 60mg  x 2 days, then 50mg  x 2 days, then 40mg  x 2 days, then 30mg  x 2 days, then 20mg  x 2 days, then 10mg  x 2 days, then stop. 07/11/20   Marshell Garfinkel, MD   SPIRIVA RESPIMAT 2.5 MCG/ACT AERS INHALE TWO PUFFS INTO THE LUNGS DAILY 11/09/20   Claretta Fraise, MD  sucralfate (CARAFATE) 1 g tablet TAKE ONE TABLET BY MOUTH TWICE DAILY 08/13/20   Claretta Fraise, MD  Vitamin D, Ergocalciferol, (DRISDOL) 1.25 MG (50000 UNIT) CAPS capsule Take 1 capsule (50,000 Units total) by mouth every 7 (seven) days. 08/31/20   Claretta Fraise, MD    Allergies    Lorcet [phone] [hydrocodone-acetaminophen], Sulfa antibiotics, and Tape  Review of Systems   Review of Systems  Constitutional:  Negative for diaphoresis and fever.  Respiratory:  Negative for cough and shortness of breath.   Cardiovascular:  Positive for chest pain. Negative for palpitations and leg swelling.  Gastrointestinal:  Negative for nausea.  Musculoskeletal:  Positive for back pain and myalgias.  All other systems reviewed and are negative.  Physical Exam Updated Vital Signs BP 118/72 (BP Location: Left Arm)   Pulse (!) 105   Temp 98.3 F (36.8 C) (Oral)   Resp 18   Ht 5\' 5"  (1.651 m)   Wt 73 kg   SpO2 100%   BMI 26.79 kg/m   Physical Exam Vitals and nursing note reviewed.  Constitutional:      General: She is not in acute distress.    Appearance: Normal appearance. She is not ill-appearing.  HENT:     Head: Normocephalic and atraumatic.  Eyes:     General: No scleral icterus. Cardiovascular:     Rate and Rhythm: Regular rhythm. Tachycardia present.     Pulses: Normal pulses.     Heart sounds: No murmur heard. Pulmonary:     Effort: Pulmonary effort is normal. No respiratory distress.  Abdominal:  Palpations: Abdomen is soft.  Musculoskeletal:        General: Tenderness present.     Cervical back: Normal range of motion and neck supple. No tenderness.       Back:  Skin:    General: Skin is warm and dry.     Capillary Refill: Capillary refill takes less than 2 seconds.     Findings: No bruising or rash.  Neurological:     General: No focal deficit present.     Mental  Status: She is alert and oriented to person, place, and time. Mental status is at baseline.  Psychiatric:        Mood and Affect: Mood normal.        Behavior: Behavior normal.        Thought Content: Thought content normal.        Judgment: Judgment normal.    ED Results / Procedures / Treatments   Labs (all labs ordered are listed, but only abnormal results are displayed) Labs Reviewed  CBC WITH DIFFERENTIAL/PLATELET - Abnormal; Notable for the following components:      Result Value   WBC 13.0 (*)    Neutro Abs 10.2 (*)    Abs Immature Granulocytes 0.14 (*)    All other components within normal limits  COMPREHENSIVE METABOLIC PANEL - Abnormal; Notable for the following components:   Total Protein 6.1 (*)    AST 10 (*)    All other components within normal limits  TROPONIN I (HIGH SENSITIVITY)  TROPONIN I (HIGH SENSITIVITY)    EKG None  Radiology DG Ribs Unilateral W/Chest Left  Result Date: 02/05/2021 CLINICAL DATA:  Left posterior rib and upper back pain EXAM: LEFT RIBS AND CHEST - 3+ VIEW COMPARISON:  09/28/2020 chest radiograph FINDINGS: No fracture or other bone lesions are seen involving the ribs. There is no evidence of pneumothorax or pleural effusion. Both lungs are clear. Heart size and mediastinal contours are within normal limits. IMPRESSION: Negative. Electronically Signed   By: Merilyn Baba M.D.   On: 02/05/2021 11:15    Procedures Procedures   Medications Ordered in ED Medications  ketorolac (TORADOL) 30 MG/ML injection 30 mg (30 mg Intramuscular Given 02/05/21 1225)    ED Course  I have reviewed the triage vital signs and the nursing notes.  Pertinent labs & imaging results that were available during my care of the patient were reviewed by me and considered in my medical decision making (see chart for details).    MDM Rules/Calculators/A&P 65 year old female who presents to the emergency department with left-sided back pain.  While on initial  interview the patient states that she is also having left-sided chest pain just under the breast.  Given mixed picture musculoskeletal versus cardiac etiology we will proceed with cardiac work-up. EKG without evidence of ischemia or infarction.  Troponin x2 negative.  Doubt ACS as a component of her pain. She does not appear fluid volume overloaded, doubt heart failure. Chest x-ray without pneumothorax or pneumonia. There is no rib fracture to contribute to the chest pain. Presentations not consistent with dissection, pericarditis, tamponade, myocarditis.  Left-sided back pain is likely musculoskeletal in nature from moving her sister.  We will provide her with a prescription for Robaxin over the next few days.  Given Toradol here in the emergency department with some relief of symptoms.  She is agreeable to plan.  She is instructed to return to the emergency department should she have ongoing chest pain or new chest pain  with nausea, diaphoresis, radiation, shortness of breath.  Vital signs are stable Safe for discharge Final Clinical Impression(s) / ED Diagnoses Final diagnoses:  Muscle strain    Rx / DC Orders ED Discharge Orders          Ordered    methocarbamol (ROBAXIN) 500 MG tablet  2 times daily        02/05/21 1402             Mickie Hillier, PA-C 02/05/21 1514    Sherwood Gambler, MD 02/07/21 [phone]

## 2021-02-05 NOTE — Discharge Instructions (Addendum)
You were seen in the emergency department today for pain in your left back and left ribs.  While you were here we did a thorough work-up which showed that you did not have any acute fracture of your ribs.  Your heart also looked normal at this time.  It is likely that you strained a muscle in your back.  I am discharging you with a medication called Robaxin which is a muscle relaxant.  Please take this at night.  Please do not mix this medication with benzodiazepines such as clonazepam.  Please use do not mix this medication with oxycodone.  Please return to the emergency department for continued worsening of your pain symptoms.

## 2021-02-07 ENCOUNTER — Other Ambulatory Visit: Payer: Self-pay | Admitting: Family Medicine

## 2021-02-07 DIAGNOSIS — E782 Mixed hyperlipidemia: Secondary | ICD-10-CM

## 2021-02-07 DIAGNOSIS — I10 Essential (primary) hypertension: Secondary | ICD-10-CM

## 2021-02-12 ENCOUNTER — Ambulatory Visit (INDEPENDENT_AMBULATORY_CARE_PROVIDER_SITE_OTHER): Payer: Medicare Other | Admitting: Family Medicine

## 2021-02-12 ENCOUNTER — Encounter: Payer: Self-pay | Admitting: Family Medicine

## 2021-02-12 DIAGNOSIS — G8911 Acute pain due to trauma: Secondary | ICD-10-CM

## 2021-02-12 DIAGNOSIS — M25512 Pain in left shoulder: Secondary | ICD-10-CM

## 2021-02-12 DIAGNOSIS — M546 Pain in thoracic spine: Secondary | ICD-10-CM | POA: Diagnosis not present

## 2021-02-12 MED ORDER — PREDNISONE 10 MG PO TABS: ORAL_TABLET | ORAL | 0 refills | Status: DC

## 2021-02-12 MED ORDER — METHOCARBAMOL 500 MG PO TABS: 500.0000 mg | ORAL_TABLET | Freq: Two times a day (BID) | ORAL | 0 refills | Status: DC

## 2021-02-12 NOTE — Progress Notes (Signed)
Subjective:    Patient ID: [name], female    DOB: 03-07-1955, 65 y.o.   MRN: [id number]   HPI: [name] is a 65 y.o. female presenting for 2 weeks ago pulled hersel up in bed. Started having left shoulder pain radiating to mid back. Feels like it is ripping in two. Can't do anything. Muscle relaxer and her pain meds aren't helping.    Depression screen [facility] 2/9 08/28/2020 08/28/2020 04/02/2020 02/28/2020 08/22/2019  Decreased Interest 3 0 2 2 0  Down, Depressed, Hopeless 2 0 2 2 1   PHQ - 2 Score 5 0 4 4 1   Altered sleeping 2 - 1 1 -  Tired, decreased energy 2 - 2 2 -  Change in appetite 2 - 1 1 -  Feeling bad or failure about yourself  1 - 0 0 -  Trouble concentrating 2 - 2 2 -  Moving slowly or fidgety/restless 0 - 0 0 -  Suicidal thoughts 0 - 0 0 -  PHQ-9 Score 14 - 10 10 -  Difficult doing work/chores Somewhat difficult - Somewhat difficult Somewhat difficult -  Some encounter information is confidential and restricted. Go to Review Flowsheets activity to see all data.  Some recent data might be hidden     Relevant past medical, surgical, family and social history reviewed and updated as indicated.  Interim medical history since our last visit reviewed. Allergies and medications reviewed and updated.  ROS:  Review of Systems  Constitutional: Negative.   HENT: Negative.    Eyes:  Negative for visual disturbance.  Respiratory:  Negative for shortness of breath.   Cardiovascular:  Negative for chest pain.  Gastrointestinal:  Negative for abdominal pain.  Musculoskeletal:  Positive for arthralgias and back pain.    Social History   Tobacco Use  Smoking Status Every Day   Packs/day: 1.00   Years: 40.00   Pack years: 40.00   Types: Cigarettes   Start date: 05/01/1969  Smokeless Tobacco Never       Objective:     Wt Readings from Last 3 Encounters:  02/05/21 161 lb (73 kg)  01/30/21 161 lb (73 kg)  10/04/20 162 lb (73.5 kg)     Exam deferred. Pt.  [place] due to COVID 19. Phone visit performed.   Assessment & Plan:   1. Acute pain of left shoulder due to trauma   2. Acute bilateral thoracic back pain     Meds ordered this encounter  Medications   predniSONE (DELTASONE) 10 MG tablet    Sig: Take 5 daily for 3 days followed by 4,3,2 and 1 for 3 days each.    Dispense:  45 tablet    Refill:  0   methocarbamol (ROBAXIN) 500 MG tablet    Sig: Take 1 tablet (500 mg total) by mouth 2 (two) times daily.    Dispense:  20 tablet    Refill:  0    Orders Placed This Encounter  Procedures   MR Shoulder Left Wo Contrast    Standing Status:   Future    Standing Expiration Date:   02/12/2022    Order Specific Question:   What is the patient's sedation requirement?    Answer:   No Sedation    Order Specific Question:   Does the patient have a pacemaker or implanted devices?    Answer:   Yes    Order Specific Question:   Preferred imaging location?    Answer:  [facility] (table limit [phone])    Order Specific Question:   Release to patient    Answer:   Immediate      Diagnoses and all orders for this visit:  Acute pain of left shoulder due to trauma -     MR Shoulder Left Wo Contrast; Future  Acute bilateral thoracic back pain  Other orders -     predniSONE (DELTASONE) 10 MG tablet; Take 5 daily for 3 days followed by 4,3,2 and 1 for 3 days each. -     methocarbamol (ROBAXIN) 500 MG tablet; Take 1 tablet (500 mg total) by mouth 2 (two) times daily.   Virtual Visit via telephone Note  I discussed the limitations, risks, security and privacy concerns of performing an evaluation and management service by telephone and the availability of in person appointments. The patient was identified with two identifiers. Pt.expressed understanding and agreed to proceed. Pt. Is at home. Dr. Livia Snellen is in his office.  Follow Up Instructions:   I discussed the assessment and treatment plan with the patient. The patient was  provided an opportunity to ask questions and all were answered. The patient agreed with the plan and demonstrated an understanding of the instructions.   The patient was advised to call back or seek an in-person evaluation if the symptoms worsen or if the condition fails to improve as anticipated.   Total minutes including chart review and phone contact time: 17   Follow up plan: Return in about 2 weeks (around 02/26/2021), or if symptoms worsen or fail to improve.  Claretta Fraise, MD [place]

## 2021-02-14 DIAGNOSIS — M6281 Muscle weakness (generalized): Secondary | ICD-10-CM | POA: Diagnosis not present

## 2021-02-21 ENCOUNTER — Encounter: Payer: Medicare Other | Admitting: Dermatology

## 2021-02-21 DIAGNOSIS — H2511 Age-related nuclear cataract, right eye: Secondary | ICD-10-CM | POA: Diagnosis not present

## 2021-02-26 ENCOUNTER — Encounter (HOSPITAL_COMMUNITY)
Admission: RE | Admit: 2021-02-26 | Discharge: 2021-02-26 | Disposition: A | Discharge status: Home / Self Care | Payer: Medicare Other | Source: Ambulatory Visit | Attending: Ophthalmology | Admitting: Ophthalmology

## 2021-02-26 ENCOUNTER — Other Ambulatory Visit: Payer: Self-pay | Admitting: *Deleted

## 2021-02-26 DIAGNOSIS — F17210 Nicotine dependence, cigarettes, uncomplicated: Secondary | ICD-10-CM

## 2021-02-26 DIAGNOSIS — Z87891 Personal history of nicotine dependence: Secondary | ICD-10-CM

## 2021-02-26 NOTE — H&P (Signed)
Surgical History & Physical  Patient Name: [name] DOB: Oct 01, 1955  Surgery: Cataract extraction with intraocular lens implant phacoemulsification; Right Eye  Surgeon: Baruch Goldmann MD Surgery Date:  03-01-21 Pre-Op Date:  02-21-21  HPI: A 33 Yr. old female patient is referred by Dr Rosana Hoes for cataract eval. 1. The patient complains of difficulty when Blurry vision, which began 1 year ago. Both eyes OD>OS are affected. The episode is gradual. The condition's severity increased since last visit. Symptoms occur when the patient is driving, inside and outside. This is negatively affecting the patient's quality of life. HPI was performed by Baruch Goldmann .  Medical History: Cataracts Aponeuritic Ptosis, Macular drusen, Peripheral Retinal degeneration, Hypertensive retinopathy Arthritis High Blood Pressure LDL Lung Problems Sinusitis, Headache, Degenerative disc Dis,  Review of Systems Allergic/Immunologic Seasonal Allergies All recorded systems are negative except as noted above.  Social   Current every day smoker   Medication Albuterol, Amlodipine, Atorvastatin, Clonazepam, Clonidine, Duloxetine, Oxycodone/acet, Symbicort Inhalant Product,   Sx/Procedures Hysterectomy partial,   Drug Allergies  Sulfa, Adhesive tape,   History & Physical: Heent: Cataract, Right Eye NECK: supple without bruits LUNGS: lungs clear to auscultation CV: regular rate and rhythm Abdomen: soft and non-tender  Impression & Plan: Assessment: 1.  NUCLEAR SCLEROSIS AGE RELATED; Both Eyes (H25.13) 2.  Hyperopia ; Right Eye (H52.01) 3.  DERMATOCHALASIS, no surgery; Right Upper Lid, Left Upper Lid (H02.831, H02.834) 4.  BLEPHARITIS; Right Upper Lid, Right Lower Lid, Left Upper Lid, Left Lower Lid (H01.001, H01.002,H01.004,H01.005) 5.  Pinguecula; Right Eye (H11.151) 6.  ASTIGMATISM, REGULAR; Right Eye (H52.221)  Plan: 1.  Cataract accounts for the patient's decreased vision. This visual  impairment is not correctable with a tolerable change in glasses or contact lenses. Cataract surgery with an implantation of a new lens should significantly improve the visual and functional status of the patient. Discussed all risks, benefits, alternatives, and potential complications. Discussed the procedures and recovery. Patient desires to have surgery. A-scan ordered and performed today for intra-ocular lens calculations. The surgery will be performed in order to improve vision for driving, reading, and for eye examinations. Recommend phacoemulsification with intra-ocular lens. Recommend Dextenza for post-operative pain and inflammation. Patient is also hyperopic and cataract surgery will help minimize change of acute angle closure. Right Eye. Dilates well - shugarcaine by protocol. Declines Toric Lens.  2.   3.  Asymptomatic, recommend observation for now. Findings, prognosis and treatment options reviewed.  4.  Recommend regular lid cleaning  5.  Observe; Artificial tears as needed for irritation.  6.  Details Toric IOL

## 2021-02-27 ENCOUNTER — Other Ambulatory Visit: Payer: Self-pay

## 2021-02-27 ENCOUNTER — Ambulatory Visit (HOSPITAL_COMMUNITY)
Admission: RE | Admit: 2021-02-27 | Discharge: 2021-02-27 | Disposition: A | Discharge status: Home / Self Care | Payer: Medicare Other | Source: Ambulatory Visit | Attending: Family Medicine | Admitting: Family Medicine

## 2021-02-27 DIAGNOSIS — G8911 Acute pain due to trauma: Secondary | ICD-10-CM | POA: Insufficient documentation

## 2021-02-27 DIAGNOSIS — M19012 Primary osteoarthritis, left shoulder: Secondary | ICD-10-CM | POA: Diagnosis not present

## 2021-02-27 DIAGNOSIS — M25512 Pain in left shoulder: Secondary | ICD-10-CM | POA: Insufficient documentation

## 2021-02-27 DIAGNOSIS — M7552 Bursitis of left shoulder: Secondary | ICD-10-CM | POA: Diagnosis not present

## 2021-02-27 DIAGNOSIS — M75102 Unspecified rotator cuff tear or rupture of left shoulder, not specified as traumatic: Secondary | ICD-10-CM | POA: Diagnosis not present

## 2021-02-28 ENCOUNTER — Other Ambulatory Visit: Payer: Self-pay | Admitting: Family Medicine

## 2021-02-28 DIAGNOSIS — M12819 Other specific arthropathies, not elsewhere classified, unspecified shoulder: Secondary | ICD-10-CM

## 2021-03-01 ENCOUNTER — Encounter (HOSPITAL_COMMUNITY): Payer: Self-pay | Admitting: Ophthalmology

## 2021-03-01 ENCOUNTER — Encounter (HOSPITAL_COMMUNITY)
Admission: RE | Disposition: A | Discharge status: Home / Self Care | Payer: Self-pay | Source: Home / Self Care | Attending: Ophthalmology

## 2021-03-01 ENCOUNTER — Ambulatory Visit (HOSPITAL_COMMUNITY)
Admission: RE | Admit: 2021-03-01 | Discharge: 2021-03-01 | Disposition: A | Discharge status: Home / Self Care | Payer: Medicare Other | Attending: Ophthalmology | Admitting: Ophthalmology

## 2021-03-01 ENCOUNTER — Ambulatory Visit (HOSPITAL_COMMUNITY): Payer: Medicare Other | Admitting: Anesthesiology

## 2021-03-01 DIAGNOSIS — H2511 Age-related nuclear cataract, right eye: Secondary | ICD-10-CM | POA: Insufficient documentation

## 2021-03-01 DIAGNOSIS — J449 Chronic obstructive pulmonary disease, unspecified: Secondary | ICD-10-CM | POA: Insufficient documentation

## 2021-03-01 DIAGNOSIS — I10 Essential (primary) hypertension: Secondary | ICD-10-CM | POA: Insufficient documentation

## 2021-03-01 DIAGNOSIS — I2510 Atherosclerotic heart disease of native coronary artery without angina pectoris: Secondary | ICD-10-CM | POA: Insufficient documentation

## 2021-03-01 DIAGNOSIS — K219 Gastro-esophageal reflux disease without esophagitis: Secondary | ICD-10-CM | POA: Insufficient documentation

## 2021-03-01 DIAGNOSIS — H5201 Hypermetropia, right eye: Secondary | ICD-10-CM | POA: Insufficient documentation

## 2021-03-01 DIAGNOSIS — H0100A Unspecified blepharitis right eye, upper and lower eyelids: Secondary | ICD-10-CM | POA: Diagnosis not present

## 2021-03-01 DIAGNOSIS — H52221 Regular astigmatism, right eye: Secondary | ICD-10-CM | POA: Diagnosis not present

## 2021-03-01 DIAGNOSIS — H0100B Unspecified blepharitis left eye, upper and lower eyelids: Secondary | ICD-10-CM | POA: Diagnosis not present

## 2021-03-01 DIAGNOSIS — F17200 Nicotine dependence, unspecified, uncomplicated: Secondary | ICD-10-CM | POA: Diagnosis not present

## 2021-03-01 DIAGNOSIS — F17219 Nicotine dependence, cigarettes, with unspecified nicotine-induced disorders: Secondary | ICD-10-CM | POA: Diagnosis not present

## 2021-03-01 HISTORY — PX: CATARACT EXTRACTION W/PHACO: SHX586

## 2021-03-01 SURGERY — PHACOEMULSIFICATION, CATARACT, WITH IOL INSERTION
Anesthesia: Monitor Anesthesia Care | Site: Eye | Laterality: Right

## 2021-03-01 MED ORDER — BSS IO SOLN
INTRAOCULAR | Status: DC | PRN
  Administered 2021-03-01: 15 mL via INTRAOCULAR

## 2021-03-01 MED ORDER — FENTANYL CITRATE (PF) 100 MCG/2ML IJ SOLN
INTRAMUSCULAR | Status: AC
  Filled 2021-03-01: qty 2

## 2021-03-01 MED ORDER — TETRACAINE HCL 0.5 % OP SOLN
1.0000 [drp] | OPHTHALMIC | Status: AC | PRN
  Administered 2021-03-01 (×3): 1 [drp] via OPHTHALMIC

## 2021-03-01 MED ORDER — PHENYLEPHRINE HCL 2.5 % OP SOLN
1.0000 [drp] | OPHTHALMIC | Status: AC | PRN
  Administered 2021-03-01 (×3): 1 [drp] via OPHTHALMIC

## 2021-03-01 MED ORDER — SODIUM CHLORIDE 0.9% FLUSH
INTRAVENOUS | Status: DC | PRN
  Administered 2021-03-01 (×2): 5 mL via INTRAVENOUS

## 2021-03-01 MED ORDER — EPINEPHRINE PF 1 MG/ML IJ SOLN
INTRAOCULAR | Status: DC | PRN
  Administered 2021-03-01: 09:00:00 500 mL

## 2021-03-01 MED ORDER — EPINEPHRINE PF 1 MG/ML IJ SOLN
INTRAMUSCULAR | Status: AC
  Filled 2021-03-01: qty 2

## 2021-03-01 MED ORDER — LIDOCAINE HCL (PF) 1 % IJ SOLN
INTRAOCULAR | Status: DC | PRN
  Administered 2021-03-01: 09:00:00 1 mL via OPHTHALMIC

## 2021-03-01 MED ORDER — LIDOCAINE HCL 3.5 % OP GEL
1.0000 "application " | Freq: Once | OPHTHALMIC | Status: AC
  Administered 2021-03-01: 1 via OPHTHALMIC

## 2021-03-01 MED ORDER — FENTANYL CITRATE (PF) 100 MCG/2ML IJ SOLN
INTRAMUSCULAR | Status: DC | PRN
  Administered 2021-03-01: 50 ug via INTRAVENOUS

## 2021-03-01 MED ORDER — MIDAZOLAM HCL 2 MG/2ML IJ SOLN
INTRAMUSCULAR | Status: AC
  Filled 2021-03-01: qty 2

## 2021-03-01 MED ORDER — SODIUM HYALURONATE 23MG/ML IO SOSY
PREFILLED_SYRINGE | INTRAOCULAR | Status: DC | PRN
  Administered 2021-03-01: 0.6 mL via INTRAOCULAR

## 2021-03-01 MED ORDER — POVIDONE-IODINE 5 % OP SOLN
OPHTHALMIC | Status: DC | PRN
  Administered 2021-03-01: 1 via OPHTHALMIC

## 2021-03-01 MED ORDER — TROPICAMIDE 1 % OP SOLN
1.0000 [drp] | OPHTHALMIC | Status: AC | PRN
  Administered 2021-03-01 (×3): 1 [drp] via OPHTHALMIC
  Filled 2021-03-01: qty 2

## 2021-03-01 MED ORDER — MIDAZOLAM HCL 5 MG/5ML IJ SOLN
INTRAMUSCULAR | Status: DC | PRN
  Administered 2021-03-01: 2 mg via INTRAVENOUS

## 2021-03-01 MED ORDER — STERILE WATER FOR IRRIGATION IR SOLN
Status: DC | PRN
  Administered 2021-03-01: 250 mL

## 2021-03-01 MED ORDER — SODIUM HYALURONATE 10 MG/ML IO SOLUTION
PREFILLED_SYRINGE | INTRAOCULAR | Status: DC | PRN
  Administered 2021-03-01: 0.85 mL via INTRAOCULAR

## 2021-03-01 SURGICAL SUPPLY — 11 items
CATARACT SUITE SIGHTPATH (MISCELLANEOUS) ×1 IMPLANT
CLOTH BEACON ORANGE TIMEOUT ST (SAFETY) ×1 IMPLANT
EYE SHIELD UNIVERSAL CLEAR (GAUZE/BANDAGES/DRESSINGS) ×1 IMPLANT
GLOVE SURG UNDER POLY LF SZ7 (GLOVE) ×2 IMPLANT
NDL HYPO 18GX1.5 BLUNT FILL (NEEDLE) IMPLANT
NEEDLE HYPO 18GX1.5 BLUNT FILL (NEEDLE) ×2 IMPLANT
PAD ARMBOARD 7.5X6 YLW CONV (MISCELLANEOUS) ×1 IMPLANT
RayOne EMV US (Intraocular Lens) ×1 IMPLANT
SYR TB 1ML LL NO SAFETY (SYRINGE) ×1 IMPLANT
TAPE PAPER MEDFIX 1IN X 10YD (GAUZE/BANDAGES/DRESSINGS) ×1 IMPLANT
WATER STERILE IRR 250ML POUR (IV SOLUTION) ×1 IMPLANT

## 2021-03-01 NOTE — Op Note (Signed)
Date of procedure: 03/01/21  Pre-operative diagnosis: Visually significant age-related nuclear cataract, Right Eye (H25.11)  Post-operative diagnosis: Visually significant age-related nuclear cataract, Right Eye  Procedure: Removal of cataract via phacoemulsification and insertion of intra-ocular lens Rayner [id number] +24.0D into the capsular bag of the Right Eye  Attending surgeon: Gerda Diss. Ketsia Linebaugh, MD, MA  Anesthesia: MAC, Topical Akten  Complications: None  Estimated Blood Loss: <37m (minimal)  Specimens: None  Implants: As above  Indications:  Visually significant age-related cataract, Right Eye  Procedure:  The patient was seen and identified in the pre-operative area. The operative eye was identified and dilated.  The operative eye was marked.  Topical anesthesia was administered to the operative eye.     The patient was then to the operative suite and placed in the supine position.  A timeout was performed confirming the patient, procedure to be performed, and all other relevant information.   The patient's face was prepped and draped in the usual fashion for intra-ocular surgery.  A lid speculum was placed into the operative eye and the surgical microscope moved into place and focused.  A superotemporal paracentesis was created using a 20 gauge paracentesis blade.  Shugarcaine was injected into the anterior chamber.  Viscoelastic was injected into the anterior chamber.  A temporal clear-corneal main wound incision was created using a 2.432mmicrokeratome.  A continuous curvilinear capsulorrhexis was initiated using an irrigating cystitome and completed using capsulorrhexis forceps.  Hydrodissection and hydrodeliniation were performed.  Viscoelastic was injected into the anterior chamber.  A phacoemulsification handpiece and a chopper as a second instrument were used to remove the nucleus and epinucleus. The irrigation/aspiration handpiece was used to remove any remaining cortical  material.   The capsular bag was reinflated with viscoelastic, checked, and found to be intact.  The intraocular lens was inserted into the capsular bag.  The irrigation/aspiration handpiece was used to remove any remaining viscoelastic.  The clear corneal wound and paracentesis wounds were then hydrated and checked with Weck-Cels to be watertight.  The lid-speculum and drape was removed, and the patient's face was cleaned with a wet and dry 4x4. A clear shield was taped over the eye. The patient was taken to the post-operative care unit in good condition, having tolerated the procedure well.  Post-Op Instructions: [facility] and will receive all other orders and instructions.

## 2021-03-01 NOTE — Anesthesia Postprocedure Evaluation (Signed)
Anesthesia Post Note  Patient: [name]  Procedure(s) Performed: CATARACT EXTRACTION PHACO AND INTRAOCULAR LENS PLACEMENT (IOC) (Right: Eye)  Patient location during evaluation: Short Stay Anesthesia Type: MAC Level of consciousness: awake and alert Pain management: pain level controlled Vital Signs Assessment: post-procedure vital signs reviewed and stable Respiratory status: spontaneous breathing Cardiovascular status: blood pressure returned to baseline and stable Postop Assessment: no apparent nausea or vomiting Anesthetic complications: no   No notable events documented.   Last Vitals:  Vitals:   03/01/21 0733  BP: 140/76  Pulse: 87  Resp: (!) 22  Temp: 36.8 C  SpO2: 96%    Last Pain:  Vitals:   03/01/21 0733  TempSrc: Oral  PainSc: 5                  Yazhini Mcaulay

## 2021-03-01 NOTE — Transfer of Care (Signed)
Immediate Anesthesia Transfer of Care Note  Patient: [name]  Procedure(s) Performed: CATARACT EXTRACTION PHACO AND INTRAOCULAR LENS PLACEMENT (IOC) (Right: Eye)  Patient Location: Short Stay  Anesthesia Type:MAC  Level of Consciousness: awake  Airway & Oxygen Therapy: Patient Spontanous Breathing  Post-op Assessment: Report given to RN  Post vital signs: Reviewed and stable  Last Vitals:  Vitals Value Taken Time  BP    Temp    Pulse    Resp    SpO2      Last Pain:  Vitals:   03/01/21 0733  TempSrc: Oral  PainSc: 5       Patients Stated Pain Goal: 8 (98/42/10 3128)  Complications: No notable events documented.

## 2021-03-01 NOTE — Interval H&P Note (Signed)
History and Physical Interval Note:  03/01/2021 8:26 AM  [name]  has presented today for surgery, with the diagnosis of nuclear sclerosis age related cataract, right eye.  The various methods of treatment have been discussed with the patient and family. After consideration of risks, benefits and other options for treatment, the patient has consented to  Procedure(s) with comments: CATARACT EXTRACTION PHACO AND INTRAOCULAR LENS PLACEMENT (IOC) (Right) - right as a surgical intervention.  The patient's history has been reviewed, patient examined, no change in status, stable for surgery.  I have reviewed the patient's chart and labs.  Questions were answered to the patient's satisfaction.     Baruch Goldmann

## 2021-03-01 NOTE — Anesthesia Preprocedure Evaluation (Signed)
Anesthesia Evaluation  Patient identified by MRN, date of birth, ID band Patient awake    Reviewed: Allergy & Precautions, H&P , NPO status , Patient's Chart, lab work & pertinent test results, reviewed documented beta blocker date and time   Airway Mallampati: II  TM Distance: >3 FB Neck ROM: full    Dental no notable dental hx.    Pulmonary COPD, Current Smoker,    Pulmonary exam normal breath sounds clear to auscultation       Cardiovascular Exercise Tolerance: Good hypertension, + CAD   Rhythm:regular Rate:Normal     Neuro/Psych PSYCHIATRIC DISORDERS Anxiety Depression negative neurological ROS     GI/Hepatic Neg liver ROS, GERD  Medicated,  Endo/Other  negative endocrine ROS  Renal/GU negative Renal ROS  negative genitourinary   Musculoskeletal   Abdominal   Peds  Hematology negative hematology ROS (+)   Anesthesia Other Findings   Reproductive/Obstetrics negative OB ROS                             Anesthesia Physical Anesthesia Plan  ASA: 3  Anesthesia Plan: MAC   Post-op Pain Management:    Induction:   PONV Risk Score and Plan:   Airway Management Planned:   Additional Equipment:   Intra-op Plan:   Post-operative Plan:   Informed Consent: I have reviewed the patients History and Physical, chart, labs and discussed the procedure including the risks, benefits and alternatives for the proposed anesthesia with the patient or authorized representative who has indicated his/her understanding and acceptance.     Dental Advisory Given  Plan Discussed with: CRNA  Anesthesia Plan Comments:         Anesthesia Quick Evaluation

## 2021-03-01 NOTE — Discharge Instructions (Addendum)
Please discharge patient when stable, will follow up today with Dr. Wrzosek at the [facility] [place] office immediately following discharge.  Leave shield in place until visit.  All paperwork with discharge instructions will be given at the office.  [facility] [place] Address:  [address]  , [place] [address]  

## 2021-03-05 ENCOUNTER — Encounter (HOSPITAL_COMMUNITY): Payer: Self-pay | Admitting: Ophthalmology

## 2021-03-12 ENCOUNTER — Other Ambulatory Visit: Payer: Self-pay | Admitting: Cardiology

## 2021-03-12 ENCOUNTER — Other Ambulatory Visit: Payer: Self-pay | Admitting: Family Medicine

## 2021-03-12 DIAGNOSIS — E782 Mixed hyperlipidemia: Secondary | ICD-10-CM

## 2021-03-12 DIAGNOSIS — I10 Essential (primary) hypertension: Secondary | ICD-10-CM

## 2021-03-15 ENCOUNTER — Encounter (HOSPITAL_COMMUNITY)
Admission: RE | Admit: 2021-03-15 | Discharge: 2021-03-15 | Disposition: A | Discharge status: Home / Self Care | Payer: Medicare Other | Source: Ambulatory Visit | Attending: Ophthalmology | Admitting: Ophthalmology

## 2021-03-15 ENCOUNTER — Encounter (HOSPITAL_COMMUNITY): Payer: Self-pay

## 2021-03-15 ENCOUNTER — Other Ambulatory Visit: Payer: Self-pay

## 2021-03-15 DIAGNOSIS — H2511 Age-related nuclear cataract, right eye: Secondary | ICD-10-CM | POA: Diagnosis not present

## 2021-03-16 ENCOUNTER — Other Ambulatory Visit: Payer: Self-pay | Admitting: Family Medicine

## 2021-03-17 DIAGNOSIS — M6281 Muscle weakness (generalized): Secondary | ICD-10-CM | POA: Diagnosis not present

## 2021-03-18 NOTE — H&P (Signed)
Surgical History & Physical  Patient Name: [name] DOB: [birth year]  Surgery: Cataract extraction with intraocular lens implant phacoemulsification; Left Eye  Surgeon: Baruch Goldmann MD Surgery Date:  03-22-21 Pre-Op Date:  03-14-21  HPI: A 89 Yr. old female patient The patient is returning after cataract surgery. The right eye is affected. Status post cataract surgery, which began 13 days ago: 03/01/21. Since the last visit, the affected area is doing well. The patient's vision is improved but fluctuating. Patient is following medication instructions. Pt taking Ilevro qday OD and Pred BID ODPt denies any eye pain or increase in floaters/flashes of light. The patient complains of difficulty when Blurry vision, which began 1 year ago. The left eye is affected. The episode is gradual. The condition's severity increased since last visit. Symptoms occur when the patient is driving, inside and outside. This is negatively affecting the patient's quality of life and the patient is unable to function adequately in life with the current level of vision. HPI was performed by Baruch Goldmann .  Medical History: Cataracts, Aponeuritic Ptosis, Macular drusen, Peripheral Retinal degeneration, Hypertensive retinopathy Arthritis High Blood Pressure LDL Lung Problems Sinusitis, Headache, Degenerative disc Dis,  Review of Systems Allergic/Immunologic Seasonal Allergies All recorded systems are negative except as noted above.  Social   Current every day smoker   Medication Prednisolone, Ilevro, Albuterol, Amlodipine, Atorvastatin, Clonazepam, Clonidine, Duloxetine, Oxycodone/acet, Symbicort Inhalant Product,   Sx/Procedures Phaco c IOL OD, Hysterectomy partial,   Drug Allergies  Sulfa, Adhesive tape,   History & Physical: Heent: Cataract, Left Eye NECK: supple without bruits LUNGS: lungs clear to auscultation CV: regular rate and rhythm Abdomen: soft and non-tender  Impression &  Plan: Assessment: 1.  CATARACT EXTRACTION STATUS; Right Eye (Z98.41) 2.  NUCLEAR SCLEROSIS AGE RELATED; , Left Eye (H25.12) 3.  Myopia ; Right Eye (H52.11) 4.  Presbyopia ; Both Eyes (H52.4)  Plan: 1.  1 week after cataract surgery. Doing well with improved vision and normal eye pressure. Call with any problems or concerns. Stop Vigamox. Continue Ilevro 1 drop 1x/day for 3 more weeks. Continue Pred Acetate 1 drop 2x/day for 3 more weeks.  2.  Cataract accounts for the patient's decreased vision. This visual impairment is not correctable with a tolerable change in glasses or contact lenses. Cataract surgery with an implantation of a new lens should significantly improve the visual and functional status of the patient. Discussed all risks, benefits, alternatives, and potential complications. Discussed the procedures and recovery. Patient desires to have surgery. A-scan ordered and performed today for intra-ocular lens calculations. The surgery will be performed in order to improve vision for driving, reading, and for eye examinations. Recommend phacoemulsification with intra-ocular lens. Recommend Dextenza for post-operative pain and inflammation. Left Eye. Surgery required to correct imbalance of vision. Dilates well - shugarcaine by protocol.  3.   4.

## 2021-03-21 ENCOUNTER — Encounter (HOSPITAL_COMMUNITY): Payer: Self-pay | Admitting: Anesthesiology

## 2021-03-22 ENCOUNTER — Encounter (HOSPITAL_COMMUNITY): Admission: RE | Payer: Self-pay | Source: Home / Self Care

## 2021-03-22 ENCOUNTER — Ambulatory Visit (HOSPITAL_COMMUNITY): Admission: RE | Admit: 2021-03-22 | Payer: Medicare Other | Source: Home / Self Care | Admitting: Ophthalmology

## 2021-03-22 DIAGNOSIS — N281 Cyst of kidney, acquired: Secondary | ICD-10-CM | POA: Diagnosis not present

## 2021-03-22 DIAGNOSIS — M545 Low back pain, unspecified: Secondary | ICD-10-CM | POA: Diagnosis not present

## 2021-03-22 DIAGNOSIS — K5730 Diverticulosis of large intestine without perforation or abscess without bleeding: Secondary | ICD-10-CM | POA: Diagnosis not present

## 2021-03-22 DIAGNOSIS — F17210 Nicotine dependence, cigarettes, uncomplicated: Secondary | ICD-10-CM | POA: Diagnosis not present

## 2021-03-22 DIAGNOSIS — M549 Dorsalgia, unspecified: Secondary | ICD-10-CM | POA: Diagnosis not present

## 2021-03-22 DIAGNOSIS — R1011 Right upper quadrant pain: Secondary | ICD-10-CM | POA: Diagnosis not present

## 2021-03-22 DIAGNOSIS — G8929 Other chronic pain: Secondary | ICD-10-CM | POA: Diagnosis not present

## 2021-03-22 DIAGNOSIS — J439 Emphysema, unspecified: Secondary | ICD-10-CM | POA: Diagnosis not present

## 2021-03-22 SURGERY — PHACOEMULSIFICATION, CATARACT, WITH IOL INSERTION
Anesthesia: Monitor Anesthesia Care | Laterality: Left

## 2021-03-25 ENCOUNTER — Ambulatory Visit: Payer: Medicare Other | Admitting: Nurse Practitioner

## 2021-03-26 ENCOUNTER — Telehealth: Payer: Self-pay | Admitting: *Deleted

## 2021-03-26 DIAGNOSIS — F17210 Nicotine dependence, cigarettes, uncomplicated: Secondary | ICD-10-CM | POA: Diagnosis not present

## 2021-03-26 DIAGNOSIS — M545 Low back pain, unspecified: Secondary | ICD-10-CM | POA: Diagnosis not present

## 2021-03-26 DIAGNOSIS — J441 Chronic obstructive pulmonary disease with (acute) exacerbation: Secondary | ICD-10-CM | POA: Diagnosis not present

## 2021-03-26 DIAGNOSIS — S22058A Other fracture of T5-T6 vertebra, initial encounter for closed fracture: Secondary | ICD-10-CM | POA: Diagnosis not present

## 2021-03-26 DIAGNOSIS — M4854XA Collapsed vertebra, not elsewhere classified, thoracic region, initial encounter for fracture: Secondary | ICD-10-CM | POA: Diagnosis not present

## 2021-03-26 DIAGNOSIS — X58XXXA Exposure to other specified factors, initial encounter: Secondary | ICD-10-CM | POA: Diagnosis not present

## 2021-03-26 DIAGNOSIS — M546 Pain in thoracic spine: Secondary | ICD-10-CM | POA: Diagnosis not present

## 2021-03-26 DIAGNOSIS — Z720 Tobacco use: Secondary | ICD-10-CM | POA: Diagnosis not present

## 2021-03-26 NOTE — Telephone Encounter (Signed)
Dr Wallene Huh from [facility] emergency dept called and said that the pt has been several places looking for something for pain. But her scans show she has a T5 compression fracture. They are doing a stat referral, he wanted [place] to know that her request (if she should request pain meds is legit) and he did not prescribe because it may interfere with her contract here.

## 2021-03-28 ENCOUNTER — Emergency Department (HOSPITAL_COMMUNITY): Payer: Medicare Other

## 2021-03-28 ENCOUNTER — Encounter (HOSPITAL_COMMUNITY): Payer: Self-pay

## 2021-03-28 ENCOUNTER — Observation Stay (HOSPITAL_COMMUNITY)
Admission: EM | Admit: 2021-03-28 | Discharge: 2021-03-29 | Disposition: A | Discharge status: Home / Self Care | Payer: Medicare Other | Attending: Internal Medicine | Admitting: Internal Medicine

## 2021-03-28 ENCOUNTER — Other Ambulatory Visit: Payer: Self-pay

## 2021-03-28 ENCOUNTER — Ambulatory Visit: Payer: Medicare Other | Admitting: Family Medicine

## 2021-03-28 DIAGNOSIS — M549 Dorsalgia, unspecified: Secondary | ICD-10-CM

## 2021-03-28 DIAGNOSIS — Z20822 Contact with and (suspected) exposure to covid-19: Secondary | ICD-10-CM | POA: Diagnosis not present

## 2021-03-28 DIAGNOSIS — Z79899 Other long term (current) drug therapy: Secondary | ICD-10-CM | POA: Diagnosis not present

## 2021-03-28 DIAGNOSIS — R0789 Other chest pain: Secondary | ICD-10-CM | POA: Diagnosis not present

## 2021-03-28 DIAGNOSIS — K219 Gastro-esophageal reflux disease without esophagitis: Secondary | ICD-10-CM

## 2021-03-28 DIAGNOSIS — J439 Emphysema, unspecified: Secondary | ICD-10-CM | POA: Diagnosis not present

## 2021-03-28 DIAGNOSIS — R52 Pain, unspecified: Secondary | ICD-10-CM | POA: Diagnosis not present

## 2021-03-28 DIAGNOSIS — R911 Solitary pulmonary nodule: Secondary | ICD-10-CM

## 2021-03-28 DIAGNOSIS — D75839 Thrombocytosis, unspecified: Secondary | ICD-10-CM

## 2021-03-28 DIAGNOSIS — S22050D Wedge compression fracture of T5-T6 vertebra, subsequent encounter for fracture with routine healing: Secondary | ICD-10-CM

## 2021-03-28 DIAGNOSIS — I700 Atherosclerosis of aorta: Secondary | ICD-10-CM | POA: Diagnosis not present

## 2021-03-28 DIAGNOSIS — I2510 Atherosclerotic heart disease of native coronary artery without angina pectoris: Secondary | ICD-10-CM | POA: Diagnosis not present

## 2021-03-28 DIAGNOSIS — D72829 Elevated white blood cell count, unspecified: Secondary | ICD-10-CM | POA: Diagnosis not present

## 2021-03-28 DIAGNOSIS — J9601 Acute respiratory failure with hypoxia: Secondary | ICD-10-CM | POA: Diagnosis not present

## 2021-03-28 DIAGNOSIS — S22050A Wedge compression fracture of T5-T6 vertebra, initial encounter for closed fracture: Secondary | ICD-10-CM | POA: Diagnosis not present

## 2021-03-28 DIAGNOSIS — F329 Major depressive disorder, single episode, unspecified: Secondary | ICD-10-CM | POA: Diagnosis present

## 2021-03-28 DIAGNOSIS — S299XXA Unspecified injury of thorax, initial encounter: Secondary | ICD-10-CM | POA: Diagnosis present

## 2021-03-28 DIAGNOSIS — X500XXA Overexertion from strenuous movement or load, initial encounter: Secondary | ICD-10-CM | POA: Diagnosis not present

## 2021-03-28 DIAGNOSIS — F419 Anxiety disorder, unspecified: Secondary | ICD-10-CM

## 2021-03-28 DIAGNOSIS — S2231XA Fracture of one rib, right side, initial encounter for closed fracture: Principal | ICD-10-CM | POA: Insufficient documentation

## 2021-03-28 DIAGNOSIS — E785 Hyperlipidemia, unspecified: Secondary | ICD-10-CM | POA: Diagnosis not present

## 2021-03-28 DIAGNOSIS — S2232XA Fracture of one rib, left side, initial encounter for closed fracture: Secondary | ICD-10-CM | POA: Diagnosis not present

## 2021-03-28 DIAGNOSIS — S22058D Other fracture of T5-T6 vertebra, subsequent encounter for fracture with routine healing: Secondary | ICD-10-CM | POA: Insufficient documentation

## 2021-03-28 DIAGNOSIS — J441 Chronic obstructive pulmonary disease with (acute) exacerbation: Secondary | ICD-10-CM | POA: Diagnosis present

## 2021-03-28 DIAGNOSIS — Z85828 Personal history of other malignant neoplasm of skin: Secondary | ICD-10-CM | POA: Insufficient documentation

## 2021-03-28 DIAGNOSIS — R079 Chest pain, unspecified: Secondary | ICD-10-CM | POA: Diagnosis not present

## 2021-03-28 DIAGNOSIS — F17210 Nicotine dependence, cigarettes, uncomplicated: Secondary | ICD-10-CM | POA: Insufficient documentation

## 2021-03-28 DIAGNOSIS — J9811 Atelectasis: Secondary | ICD-10-CM | POA: Diagnosis not present

## 2021-03-28 DIAGNOSIS — E876 Hypokalemia: Secondary | ICD-10-CM

## 2021-03-28 DIAGNOSIS — Z7982 Long term (current) use of aspirin: Secondary | ICD-10-CM | POA: Insufficient documentation

## 2021-03-28 DIAGNOSIS — I10 Essential (primary) hypertension: Secondary | ICD-10-CM | POA: Diagnosis present

## 2021-03-28 DIAGNOSIS — Z743 Need for continuous supervision: Secondary | ICD-10-CM | POA: Diagnosis not present

## 2021-03-28 DIAGNOSIS — R7989 Other specified abnormal findings of blood chemistry: Secondary | ICD-10-CM | POA: Diagnosis not present

## 2021-03-28 LAB — COMPREHENSIVE METABOLIC PANEL
ALT: 16 U/L (ref 0–44)
AST: 19 U/L (ref 15–41)
Albumin: 3.7 g/dL (ref 3.5–5.0)
Alkaline Phosphatase: 103 U/L (ref 38–126)
Anion gap: 8 (ref 5–15)
BUN: 24 mg/dL — ABNORMAL HIGH (ref 8–23)
CO2: 26 mmol/L (ref 22–32)
Calcium: 9 mg/dL (ref 8.9–10.3)
Chloride: 107 mmol/L (ref 98–111)
Creatinine, Ser: 0.72 mg/dL (ref 0.44–1.00)
GFR, Estimated: >60 mL/min (ref >60)
Glucose, Bld: 116 mg/dL — ABNORMAL HIGH (ref 70–99)
Potassium: 3.4 mmol/L — ABNORMAL LOW (ref 3.5–5.1)
Sodium: 141 mmol/L (ref 135–145)
Total Bilirubin: 0.3 mg/dL (ref 0.3–1.2)
Total Protein: 6.8 g/dL (ref 6.5–8.1)

## 2021-03-28 LAB — CBC WITH DIFFERENTIAL/PLATELET
Abs Immature Granulocytes: 0.21 10*3/uL — ABNORMAL HIGH (ref 0.00–0.07)
Basophils Absolute: 0 10*3/uL (ref 0.0–0.1)
Basophils Relative: 0 %
Eosinophils Absolute: 0 10*3/uL (ref 0.0–0.5)
Eosinophils Relative: 0 %
HCT: 43 % (ref 36.0–46.0)
Hemoglobin: 13.8 g/dL (ref 12.0–15.0)
Immature Granulocytes: 1 %
Lymphocytes Relative: 5 %
Lymphs Abs: 1 10*3/uL (ref 0.7–4.0)
MCH: 30.3 pg (ref 26.0–34.0)
MCHC: 32.1 g/dL (ref 30.0–36.0)
MCV: 94.5 fL (ref 80.0–100.0)
Monocytes Absolute: 1.1 10*3/uL — ABNORMAL HIGH (ref 0.1–1.0)
Monocytes Relative: 5 %
Neutro Abs: 18.5 10*3/uL — ABNORMAL HIGH (ref 1.7–7.7)
Neutrophils Relative %: 89 %
Platelets: 480 10*3/uL — ABNORMAL HIGH (ref 150–400)
RBC: 4.55 MIL/uL (ref 3.87–5.11)
RDW: 15.8 % — ABNORMAL HIGH (ref 11.5–15.5)
WBC: 20.9 10*3/uL — ABNORMAL HIGH (ref 4.0–10.5)
nRBC: 0 % (ref 0.0–0.2)

## 2021-03-28 LAB — RESP PANEL BY RT-PCR (FLU A&B, COVID) ARPGX2
Influenza A by PCR: NEGATIVE
Influenza B by PCR: NEGATIVE
SARS Coronavirus 2 by RT PCR: NEGATIVE

## 2021-03-28 LAB — TROPONIN I (HIGH SENSITIVITY)
Troponin I (High Sensitivity): 10 ng/L (ref <18)
Troponin I (High Sensitivity): 8 ng/L (ref <18)

## 2021-03-28 LAB — D-DIMER, QUANTITATIVE: D-Dimer, Quant: 1.36 ug/mL-FEU — ABNORMAL HIGH (ref 0.00–0.50)

## 2021-03-28 MED ORDER — LIDOCAINE 5 % EX PTCH: 1.0000 | MEDICATED_PATCH | CUTANEOUS | 0 refills | Status: DC

## 2021-03-28 MED ORDER — LIDOCAINE 5 % EX PTCH
1.0000 | MEDICATED_PATCH | CUTANEOUS | Status: DC
  Administered 2021-03-28: 1 via TRANSDERMAL
  Filled 2021-03-28 (×4): qty 1

## 2021-03-28 MED ORDER — MORPHINE SULFATE (PF) 4 MG/ML IV SOLN
4.0000 mg | Freq: Once | INTRAVENOUS | Status: AC
  Administered 2021-03-28: 4 mg via INTRAVENOUS
  Filled 2021-03-28: qty 1

## 2021-03-28 MED ORDER — OXYCODONE HCL 5 MG PO TABS
10.0000 mg | ORAL_TABLET | Freq: Once | ORAL | Status: AC
  Administered 2021-03-28: 10 mg via ORAL
  Filled 2021-03-28: qty 2

## 2021-03-28 MED ORDER — IOHEXOL 350 MG/ML SOLN
80.0000 mL | Freq: Once | INTRAVENOUS | Status: AC | PRN
  Administered 2021-03-28: 80 mL via INTRAVENOUS

## 2021-03-28 MED ORDER — ENOXAPARIN SODIUM 40 MG/0.4ML IJ SOSY
40.0000 mg | PREFILLED_SYRINGE | INTRAMUSCULAR | Status: DC
  Administered 2021-03-29: 40 mg via SUBCUTANEOUS
  Filled 2021-03-28: qty 0.4

## 2021-03-28 MED ORDER — IPRATROPIUM-ALBUTEROL 0.5-2.5 (3) MG/3ML IN SOLN
3.0000 mL | Freq: Once | RESPIRATORY_TRACT | Status: AC
  Administered 2021-03-28: 3 mL via RESPIRATORY_TRACT
  Filled 2021-03-28: qty 3

## 2021-03-28 MED ORDER — METHYLPREDNISOLONE SODIUM SUCC 125 MG IJ SOLR
125.0000 mg | Freq: Once | INTRAMUSCULAR | Status: AC
  Administered 2021-03-28: 125 mg via INTRAVENOUS
  Filled 2021-03-28: qty 2

## 2021-03-28 MED ORDER — CHLORHEXIDINE GLUCONATE CLOTH 2 % EX PADS
6.0000 | MEDICATED_PAD | Freq: Every day | CUTANEOUS | Status: DC
  Administered 2021-03-29: 6 via TOPICAL

## 2021-03-28 MED ORDER — ONDANSETRON HCL 4 MG/2ML IJ SOLN
4.0000 mg | Freq: Once | INTRAMUSCULAR | Status: AC
  Administered 2021-03-28: 4 mg via INTRAVENOUS
  Filled 2021-03-28: qty 2

## 2021-03-28 MED ORDER — ALBUTEROL SULFATE (2.5 MG/3ML) 0.083% IN NEBU
2.5000 mg | INHALATION_SOLUTION | Freq: Once | RESPIRATORY_TRACT | Status: AC
Start: 2021-03-28 — End: 2021-03-28
  Administered 2021-03-28: 2.5 mg via RESPIRATORY_TRACT
  Filled 2021-03-28: qty 3

## 2021-03-28 NOTE — ED Provider Notes (Signed)
[facility] EMERGENCY DEPARTMENT Provider Note   CSN: [id number] Arrival date & time: 03/28/21  0945     History  Chief Complaint  Patient presents with   Back Pain    [name] is a 66 y.o. female with a history including hypertension, COPD, history of opioid and benzodiazepine dependence, and CAD presenting for evaluation of mid back pain which started about 1 month ago. She describes a sudden onset of mild pain in her mid back when she was attempting to help lift her sister-in-law off the floor after a fall. However, about 2 weeks ago the pain became severe despite any new recognized injury.   She also has complaints of left shoulder pain since this event as well.  She was seen by her PCP who obtained x-rays of her left shoulder and there is some suggestion of a muscle tear in this shoulder per patient report and is scheduled to see an orthopedic specialist in [place] [employer] next week.  She also reports pain along her right rib cage, also present for the past 2 weeks .  She was seen at [facility] 2 days ago for this problem at which time she had  CT thoracic imaging revealing a T5 compression fracture.  Patient was referred to neurosurgeon in [facility] for further management of this condition, however she states she cannot drive that far for medical care.  Of note, she is in chronic pain management for musculoskeletal chronic pain in her lower back, but states she ran out of her oxycodone 15 mg strength tablets 2 days ago and has had significant exacerbation of her pain.  She is able to get her prescription refill tomorrow however.  She denies chest pain, she does have significant COPD and continues to smoke, she does not use oxygen at baseline.     The history is provided by the patient.      Home Medications Prior to Admission medications   Medication Sig Start Date End Date Taking? Authorizing Provider  lidocaine (LIDODERM) 5 % Place 1 patch onto the skin daily. Remove &  Discard patch within 12 hours or as directed by MD 03/28/21  Yes Alyne Martinson, Almyra Free, PA-C  albuterol (VENTOLIN HFA) 108 (90 Base) MCG/ACT inhaler Inhale 2 puffs into the lungs every 6 (six) hours as needed for wheezing or shortness of breath. 07/11/20   Mannam, Hart Robinsons, MD  amLODipine (NORVASC) 10 MG tablet TAKE ONE TABLET BY MOUTH DAILY 03/13/21   Arnoldo Lenis, MD  Ascorbic Acid (VITAMIN C) 1000 MG tablet Take 1,000 mg by mouth daily.    [provider]  aspirin EC 81 MG tablet Take 81 mg by mouth daily.    [provider]  atorvastatin (LIPITOR) 40 MG tablet Take 1 tablet (40 mg total) by mouth at bedtime. (NEEDS TO BE SEEN BEFORE NEXT REFILL) 03/13/21   Claretta Fraise, MD  budesonide-formoterol [facility]) 160-4.5 MCG/ACT inhaler INHALE TWO PUFFS INTO THE LUNGS TWICE DAILY 11/09/20   Claretta Fraise, MD  buPROPion (WELLBUTRIN XL) 300 MG 24 hr tablet Take 1 tablet (300 mg total) by mouth daily. 01/29/21   Cloria Spring, MD  cholecalciferol (VITAMIN D3) 25 MCG (1000 UNIT) tablet Take 1,000 Units by mouth daily.    [provider]  clonazePAM (KLONOPIN) 1 MG tablet Take 1 tablet (1 mg total) by mouth 3 (three) times daily as needed. for anxiety 01/29/21   Cloria Spring, MD  cloNIDine (CATAPRES) 0.1 MG tablet Take 1 tablet (0.1 mg  total) by mouth at bedtime. (NEEDS TO BE SEEN BEFORE NEXT REFILL) 03/13/21   Claretta Fraise, MD  dexlansoprazole (DEXILANT) 60 MG capsule TAKE ONE CAPSULE BY MOUTH DAILY ON AN EMPTY STOMACH, DO NOT EAT FOR ONE HOUR (NEEDS TO BE SEEN BEFORE NEXT REFILL) 03/13/21   Claretta Fraise, MD  DULoxetine (CYMBALTA) 60 MG capsule Take 1 capsule (60 mg total) by mouth 2 (two) times daily. 01/29/21   Cloria Spring, MD  fexofenadine (ALLEGRA) 180 MG tablet TAKE ONE TABLET BY MOUTH DAILY FOR ALLERGY SYMPTOMS 03/18/21   Claretta Fraise, MD  gabapentin (NEURONTIN) 300 MG capsule Take 300 mg by mouth 3 (three) times daily. 06/15/18   [provider]   ipratropium-albuterol (DUONEB) 0.5-2.5 (3) MG/3ML SOLN Take 3 mLs by nebulization every 6 (six) hours as needed. 02/28/20   Claretta Fraise, MD  lisinopril (ZESTRIL) 20 MG tablet Take 1 tablet (20 mg total) by mouth daily. (NEEDS TO BE SEEN BEFORE NEXT REFILL) 03/13/21   Claretta Fraise, MD  magic mouthwash (nystatin, lidocaine, diphenhydrAMINE, alum & mag hydroxide) suspension Swish and spit 5 mLs 4 (four) times daily as needed for mouth pain. 10/15/20   Claretta Fraise, MD  meloxicam (MOBIC) 15 MG tablet Take 15 mg by mouth daily. 11/26/19   [provider]  methocarbamol (ROBAXIN) 500 MG tablet Take 1 tablet (500 mg total) by mouth 2 (two) times daily. 02/12/21   Claretta Fraise, MD  oxyCODONE (ROXICODONE) 15 MG immediate release tablet Take 15 mg by mouth 4 (four) times daily as needed. 03/08/20   [provider]  pantoprazole (PROTONIX) 40 MG tablet Take 1 tablet (40 mg total) by mouth 2 (two) times daily. (NEEDS TO BE SEEN BEFORE NEXT REFILL) 03/13/21   Claretta Fraise, MD  predniSONE (DELTASONE) 10 MG tablet Take 5 daily for 3 days followed by 4,3,2 and 1 for 3 days each. 02/12/21   Claretta Fraise, MD  SPIRIVA RESPIMAT 2.5 MCG/ACT AERS INHALE TWO PUFFS INTO THE LUNGS DAILY 11/09/20   Claretta Fraise, MD  sucralfate (CARAFATE) 1 g tablet TAKE ONE TABLET BY MOUTH TWICE DAILY 08/13/20   Claretta Fraise, MD  Vitamin D, Ergocalciferol, (DRISDOL) 1.25 MG (50000 UNIT) CAPS capsule Take 1 capsule (50,000 Units total) by mouth every 7 (seven) days. 08/31/20   Claretta Fraise, MD      Allergies    Lorcet [phone] [hydrocodone-acetaminophen], Sulfa antibiotics, and Tape    Review of Systems   Review of Systems  Constitutional:  Negative for fever.  HENT:  Negative for congestion and sore throat.   Eyes: Negative.   Respiratory:  Positive for cough. Negative for chest tightness and shortness of breath.        Chronic cough  Cardiovascular:  Positive for chest pain.       Right lateral rib pain.   Gastrointestinal:  Negative for abdominal pain and nausea.  Genitourinary: Negative.   Musculoskeletal:  Positive for arthralgias and back pain. Negative for joint swelling and neck pain.  Skin: Negative.  Negative for rash and wound.  Neurological:  Negative for dizziness, weakness, light-headedness, numbness and headaches.  Psychiatric/Behavioral: Negative.     Physical Exam Updated Vital Signs BP 126/86    Pulse 87    Temp 97.7 F (36.5 C) (Oral)    Resp (!) 23    Ht 5\' 5"  (1.651 m)    Wt 71.2 kg    SpO2 93%    BMI 26.13 kg/m  Physical Exam Vitals and nursing note reviewed.  Constitutional:      Appearance: She is well-developed.  HENT:     Head: Normocephalic and atraumatic.  Eyes:     Conjunctiva/sclera: Conjunctivae normal.  Cardiovascular:     Rate and Rhythm: Normal rate and regular rhythm.     Heart sounds: Normal heart sounds.  Pulmonary:     Effort: Pulmonary effort is normal.     Breath sounds: Normal breath sounds. No wheezing, rhonchi or rales.     Comments: Patient is tender to palpation at the right chest wall mid axillary line.  There is no palpable deformity or crepitus. Chest:     Chest wall: Tenderness present.  Abdominal:     General: Bowel sounds are normal.     Palpations: Abdomen is soft.     Tenderness: There is no abdominal tenderness.  Musculoskeletal:        General: Normal range of motion.     Cervical back: Normal range of motion.     Thoracic back: Bony tenderness present. No deformity.       Back:  Skin:    General: Skin is warm and dry.  Neurological:     Mental Status: She is alert.    ED Results / Procedures / Treatments   Labs (all labs ordered are listed, but only abnormal results are displayed) Labs Reviewed  CBC WITH DIFFERENTIAL/PLATELET - Abnormal; Notable for the following components:      Result Value   WBC 20.9 (*)    RDW 15.8 (*)    Platelets 480 (*)    Neutro Abs 18.5 (*)    Monocytes Absolute 1.1 (*)    Abs  Immature Granulocytes 0.21 (*)    All other components within normal limits  COMPREHENSIVE METABOLIC PANEL - Abnormal; Notable for the following components:   Potassium 3.4 (*)    Glucose, Bld 116 (*)    BUN 24 (*)    All other components within normal limits  D-DIMER, QUANTITATIVE - Abnormal; Notable for the following components:   D-Dimer, Quant 1.36 (*)    All other components within normal limits  TROPONIN I (HIGH SENSITIVITY)  TROPONIN I (HIGH SENSITIVITY)    EKG EKG Interpretation  Date/Time:  Thursday March 28 2021 12:32:21 EST Ventricular Rate:  82 PR Interval:  130 QRS Duration: 100 QT Interval:  377 QTC Calculation: 441 R Axis:   43 Text Interpretation: Sinus rhythm Multiform ventricular premature complexes Low voltage, precordial leads Anteroseptal infarct, old Baseline wander in lead(s) II aVF No significant change since last tracing Confirmed by Calvert Cantor [phone]) on 03/28/2021 12:45:01 PM  Radiology CT Angio Chest PE W and/or Wo Contrast  Result Date: 03/28/2021 CLINICAL DATA:  Posterior chest pain.  Elevated D-dimer. EXAM: CT ANGIOGRAPHY CHEST WITH CONTRAST TECHNIQUE: Multidetector CT imaging of the chest was performed using the standard protocol during bolus administration of intravenous contrast. Multiplanar CT image reconstructions and MIPs were obtained to evaluate the vascular anatomy. RADIATION DOSE REDUCTION: This exam was performed according to the departmental dose-optimization program which includes automated exposure control, adjustment of the mA and/or kV according to patient size and/or use of iterative reconstruction technique. CONTRAST:  22mL OMNIPAQUE IOHEXOL 350 MG/ML SOLN COMPARISON:  Thoracic spine CT 03/26/2021.  Chest CT 03/22/2020 FINDINGS: Cardiovascular: No filling defect is identified in the pulmonary arterial tree to suggest pulmonary embolus. Coronary, aortic arch, and branch vessel atherosclerotic vascular disease. Aortic valve  calcifications noted. Mediastinum/Nodes: 0.6 cm an adjacent 0.7 cm left level IV lymph nodes in  the lower neck on images 3 and 7 of series 4. Roughly stable from 03/22/2020. No pathologic adenopathy is identified. Lungs/Pleura: Mild scarring anteriorly in the right upper lobe just above the minor fissure, stable. Mild atelectasis or scarring in the right lower lobe adjacent to the major fissure, new from 03/22/2020. Linear bandlike opacity in the right lower lobe on image 112 of series 6 favors atelectasis or scarring and is new from 03/22/2020. Ground-glass density 5 by 4 mm right upper lobe nodule on image 61 series 6, previously 0.4 cm on 03/22/2020. Emphysema noted. Upper Abdomen: 1.6 by 1.4 cm left adrenal mass with internal density -12 Hounsfield units compatible with adenoma. Fluid density renal lesions are probably cysts although not fully characterized. Musculoskeletal: 45% compression fracture at T5 with about 2 mm of posterior retropulsion along the inferior endplate of L5. There is associated sclerosis and accordingly this is likely subacute. Healing nondisplaced fracture of the right anterior fourth rib on image 74 series 6 Review of the MIP images confirms the above findings. IMPRESSION: 1. No filling defect is identified in the pulmonary arterial tree to suggest pulmonary embolus. 2. 45% compression fracture T5 with 2 mm posterior bony retropulsion along the inferior endplate. The vertebral body is sclerotic and accordingly this fracture is likely subacute. 3. Healing nondisplaced fracture the right anterior fourth rib. 4. 5 by 4 mm ground-glass density right upper lobe pulmonary nodule on image 61 series 6, previously 0.4 cm on 03/22/2020. Subjectively this appears mildly larger, and was even less apparent on chest CT from 03/06/2019. Given the patient's smoking history, surveillance by chest CT in 12 months time is recommended. 5. Other imaging findings of potential clinical significance: Aortic  Atherosclerosis (ICD10-I70.0) and Emphysema (ICD10-J43.9). Coronary and systemic atherosclerosis. Aortic valve calcifications. Scarring versus mild atelectasis in the right lower lobe. Left adrenal adenoma. Electronically Signed   By: Van Clines M.D.   On: 03/28/2021 14:40   DG Chest Port 1 View  Result Date: 03/28/2021 CLINICAL DATA:  Back pain and pleuritic chest pain status post lifting injury 2 weeks prior to imaging. EXAM: PORTABLE CHEST 1 VIEW COMPARISON:  Multiple exams, including thoracic spine CT from [facility] rocking hand dated 03/26/2021 and chest radiograph from 02/05/2021 FINDINGS: The previously demonstrated 40% T5 vertebral body compression fracture is poorly appreciated on today's frontal chest radiograph. Atherosclerotic calcification of the aortic arch. Suspected linear subsegmental atelectasis at the lung bases; otherwise the lungs appear clear. No blunting of the costophrenic angles. Cardiac and mediastinal margins appear normal. IMPRESSION: 1. Bibasilar linear subsegmental atelectasis. Otherwise the lungs appear clear. 2.  Aortic Atherosclerosis (ICD10-I70.0). 3. The patient has a known acute or subacute T5 compression fracture shown on thoracic spine CT from 03/26/2021. Electronically Signed   By: Van Clines M.D.   On: 03/28/2021 11:56    Procedures Procedures    Medications Ordered in ED Medications  lidocaine (LIDODERM) 5 % 1 patch (1 patch Transdermal Patch Applied 03/28/21 1742)  ondansetron (ZOFRAN) injection 4 mg (4 mg Intravenous Given 03/28/21 1137)  morphine 4 MG/ML injection 4 mg (4 mg Intravenous Given 03/28/21 1138)  iohexol (OMNIPAQUE) 350 MG/ML injection 80 mL (80 mLs Intravenous Contrast Given 03/28/21 1406)  oxyCODONE (Oxy IR/ROXICODONE) immediate release tablet 10 mg (10 mg Oral Given 03/28/21 1741)  ipratropium-albuterol (DUONEB) 0.5-2.5 (3) MG/3ML nebulizer solution 3 mL (3 mLs Nebulization Given 03/28/21 1812)  albuterol (PROVENTIL) (2.5 MG/3ML) 0.083%  nebulizer solution 2.5 mg (2.5 mg Nebulization Given 03/28/21 1812)  methylPREDNISolone sodium succinate (  SOLU-MEDROL) 125 mg/2 mL injection 125 mg (125 mg Intravenous Given 03/28/21 1824)    ED Course/ Medical Decision Making/ A&P                           Medical Decision Making Patient with midline thoracic back pain with known T5 compression fracture based on imaging from 2 days ago.  Also pain in her right lateral rib cage.  Pain is reproducible upon palpation.  During this ED visit she did have episodes of hypoxia to 89%.  She was splinting with her breathing which I suspect is completely secondary to her thoracic pain, however she did have some pulses while here which were borderline tachycardic.  Her D-dimer was elevated 1.36 therefore we decided to get a CT angio to rule out the possibility of a PE.  This test was negative.   Initially plan was to send patient home with pain medication to help her until she can get her chronic prescription filled tomorrow.  However upon reexam she had developed significant wheezing and increasing shortness of breath.  We gave her an albuterol and Atrovent neb treatment along with an IV dose of Solu-Medrol.  She continues to wheeze and on room air her oxygen sats dropped to 86%.  We will plan admission for COPD exacerbation and pain control for her thoracic vertebral compression fracture.  Amount and/or Complexity of Data Reviewed External Data Reviewed: labs and radiology.    Details: Labs and imaging from recent visit at [facility] were reviewed. Labs: ordered.    Details: Labs reviewed and significant for D-dimer of 1.36, CT angio negative for acute PE.  She does have a WBC count of 20.9.  upon further discussion with patient she states she was started on a prednisone pulse dose 2 days ago at her [facility] ED visit.  She is unsure of the dose.  This could potentially explain her WBC elevation.  There is no sign of pneumonia per her CT  scan. Radiology: ordered.  Risk Parenteral controlled substances. Decision regarding hospitalization. Risk Details: Discussed admission with Dr. Josephine Cables who accepts pt.           Final Clinical Impression(s) / ED Diagnoses Final diagnoses:  Compression fracture of T5 vertebra with routine healing, subsequent encounter  Chronic obstructive pulmonary disease with acute exacerbation (HCC)  Closed fracture of one rib of right side, initial encounter    Rx / DC Orders ED Discharge Orders          Ordered    lidocaine (LIDODERM) 5 %  Every 24 hours        03/28/21 1722    Incentive spirometry RT        03/28/21 1801              Landis Martins 03/28/21 2005    Truddie Hidden, MD 03/29/21 1454

## 2021-03-28 NOTE — ED Triage Notes (Addendum)
Pt BIB EMS from home c/o back pain mid lower back s/p "picking up sister in law off the floor 2 weeks ago", Was seen at [place] last Friday and Tuesday and Xray revealed "back fracture". Pt was advised to see a neurosurgeon"I don't have a way to go back and forth in [place]". Was sent home with muscle relaxer and steroid. Was supposed to see PCP today, but pain was severe. Was on chronic Oxycodone for chronic back/shoulder/leg pain, ran out of prescription, sees pain mgt for chronic pain. VSS per EMS. Denies fall/ recent injury.

## 2021-03-28 NOTE — H&P (Signed)
History and Physical  [name] MGQ:[id number] DOB: 13-Dec-1955 DOA: 03/28/2021  Referring physician: Evalee Jefferson, PA-C  PCP: Claretta Fraise, MD  Patient coming from: Home  Chief Complaint: Back pain  HPI: [name] is a 66 y.o. female with medical history significant for COPD, hypertension, CAD, hyperlipidemia, GERD who presents to the emergency department due to 1 month of onset of mid back pain which started after helping to lift her up her sister-in-law from the floor after sustaining a fall, about 2 weeks ago, the pain worsened without sustaining any new injury and she also complained of left shoulder pain.  She followed up with, left shoulder x-ray was done and showed a tear of the shoulder muscle and was scheduled to see an orthopedic surgeon here in [place] next week.  She also complained of 2-week onset of right rib cage pain, she presented to [facility] 2 days ago, CT of the thorax was done and showed T5 compression fracture, she was referred to a neurosurgeon in [place], however, patient states that she cannot drive that far for treatment.  Patient states that she ran out of her chronic pain medication (oxycodone 15 mg) 2 days ago and she would not be able to refill the prescription until tomorrow. Chief complaint of cough which is associated with some chest pain and also complaining of back pain.  She denies nausea, vomiting, abdominal pain.  ED Course:  In the emergency department, BP was 160/111, but other vital signs were within normal range.  Work-up in the ED showed leukocytosis, thrombocytosis, hypokalemia, D-dimer 1.36, troponin x2- 10 > 8.  Influenza A, B, SARS coronavirus 2 was negative. CT angiography chest with contrast showed no pulmonary embolus but showed 45% compression fracture T5 with 2 mm posterior bony retropulsion along the inferior endplate. Chest x-ray showed bibasilar linear subsegmental atelectasis. Otherwise the lungs appear clear. Breathing  treatment was provided, Solu-Medrol was given, IV morphine and oxycodone were given.  Hospitalist was asked to admit patient for further evaluation and management.  Review of Systems: A full 10 point Review of Systems was done, except as stated above, all other Review of systems were negative.   Past Medical History:  Diagnosis Date   Allergy    Arthritis    Atypical mole 06/05/2003   slight-mod-Left scapula (WS)   Atypical nevus 07/26/2003   persist. dysp- Left scapula (WS)   Basal cell carcinoma 06/05/2003   RIght chest (CX35FU)   Basal cell carcinoma 05/22/2004   superificial- RIght upper back- (CX35FU)   Basal cell carcinoma 11/27/2004   beside R nostril-(MOHS), below right outer nose (MOHS)   Basal cell carcinoma 11/08/2009   left post shoulder -(txpbx)   Basal cell carcinoma 05/24/2012   ulcerated- Left post shoulder- (CX35FU), ulcerated-mid forehead (EXC )   Basal cell carcinoma 06/24/2012   superificial- Right shoulder - (txpbx)    Basal cell carcinoma 07/29/2012    sclerosis- mid forehead (MOHS)   Basal cell carcinoma 06/06/2019   nod-right anterior neck (CX35FU)   Cataract    Constipation    uses OTC meds with help   COPD (chronic obstructive pulmonary disease) (HCC)    Coronary artery disease    minimal nonobstructive    COVID    Depression    GERD (gastroesophageal reflux disease)    Heart murmur    Hx of adenomatous polyp of colon 10/03/2014   Hypercholesteremia    Hypertension    Panic attacks    Perimenopausal  Skin cancer    Squamous cell carcinoma of skin 06/05/2003   Right [place](CX35FU)   Squamous cell carcinoma of skin 05/22/2004   in situ- Left upperarm (CX35FU)   Squamous cell carcinoma of skin 11/27/2004   in situ- above Left elbow (CX35FU)   Squamous cell carcinoma of skin 03/04/2006   in situ- Left sideburn (CX35FU)   Squamous cell carcinoma of skin 11/26/2006   in situ- mid brow (Cx35FU)   Squamous cell carcinoma of skin 05/24/2012    in situ- Left chest- (CX35FU)   Squamous cell carcinoma of skin 04/05/2013   in situ- Left nose (Cx35FU)   Squamous cell carcinoma of skin 08/17/2013   well diff-above Left eye (txpbx)   Squamous cell carcinoma of skin 07/30/2016   in situ- right chest sup (Cx35FU)   Squamous cell carcinoma of skin 05/12/2017   in situ- RIght shoulder (CX35FU), in situ- Left forehead (CX35FU)   Squamous cell carcinoma of skin 11/23/2018   in situ- Right mid shin, inner- (MOHS)   Tobacco user    Past Surgical History:  Procedure Laterality Date   ABDOMINAL HYSTERECTOMY     CATARACT EXTRACTION W/PHACO Right 03/01/2021   Procedure: CATARACT EXTRACTION PHACO AND INTRAOCULAR LENS PLACEMENT (IOC);  Surgeon: Baruch Goldmann, MD;  Location: [facility];  Service: Ophthalmology;  Laterality: Right;  CDE: 7.31   COLONOSCOPY     Right foot toe surgery     UPPER GASTROINTESTINAL ENDOSCOPY     vocal cord     polyp removal    Social History:  reports that she has been smoking cigarettes. She started smoking about 51 years ago. She has a 40.00 pack-year smoking history. She has never used smokeless tobacco. She reports that she does not drink alcohol and does not use drugs.   Allergies  Allergen Reactions   Lorcet 10-650 [Hydrocodone-Acetaminophen] Itching   Sulfa Antibiotics Other (See Comments)    aching   Tape Other (See Comments)    Please use paper tape    Family History  Problem Relation Age of Onset   Heart disease Mother    Heart attack Mother    Heart attack Father    Heart attack Brother    Colon cancer Brother 45       died at age 40   Anxiety disorder Brother    Depression Brother    Alcohol abuse Brother    Colon polyps Brother    Colon polyps Brother    Colon polyps Sister    Esophageal cancer Sister    Anxiety disorder Sister    Depression Sister    Colon polyps Sister    Drug abuse Son    Rectal cancer Neg Hx    Stomach cancer Neg Hx      Prior to Admission medications    Medication Sig Start Date End Date Taking? Authorizing Provider  lidocaine (LIDODERM) 5 % Place 1 patch onto the skin daily. Remove & Discard patch within 12 hours or as directed by MD 03/28/21  Yes Idol, Almyra Free, PA-C  albuterol (VENTOLIN HFA) 108 (90 Base) MCG/ACT inhaler Inhale 2 puffs into the lungs every 6 (six) hours as needed for wheezing or shortness of breath. 07/11/20   Mannam, Hart Robinsons, MD  amLODipine (NORVASC) 10 MG tablet TAKE ONE TABLET BY MOUTH DAILY 03/13/21   Arnoldo Lenis, MD  Ascorbic Acid (VITAMIN C) 1000 MG tablet Take 1,000 mg by mouth daily.    [provider]  aspirin EC 81 MG tablet  Take 81 mg by mouth daily.    [provider]  atorvastatin (LIPITOR) 40 MG tablet Take 1 tablet (40 mg total) by mouth at bedtime. (NEEDS TO BE SEEN BEFORE NEXT REFILL) 03/13/21   Claretta Fraise, MD  budesonide-formoterol [facility]) 160-4.5 MCG/ACT inhaler INHALE TWO PUFFS INTO THE LUNGS TWICE DAILY 11/09/20   Claretta Fraise, MD  buPROPion (WELLBUTRIN XL) 300 MG 24 hr tablet Take 1 tablet (300 mg total) by mouth daily. 01/29/21   Cloria Spring, MD  cholecalciferol (VITAMIN D3) 25 MCG (1000 UNIT) tablet Take 1,000 Units by mouth daily.    [provider]  clonazePAM (KLONOPIN) 1 MG tablet Take 1 tablet (1 mg total) by mouth 3 (three) times daily as needed. for anxiety 01/29/21   Cloria Spring, MD  cloNIDine (CATAPRES) 0.1 MG tablet Take 1 tablet (0.1 mg total) by mouth at bedtime. (NEEDS TO BE SEEN BEFORE NEXT REFILL) 03/13/21   Claretta Fraise, MD  dexlansoprazole (DEXILANT) 60 MG capsule TAKE ONE CAPSULE BY MOUTH DAILY ON AN EMPTY STOMACH, DO NOT EAT FOR ONE HOUR (NEEDS TO BE SEEN BEFORE NEXT REFILL) 03/13/21   Claretta Fraise, MD  DULoxetine (CYMBALTA) 60 MG capsule Take 1 capsule (60 mg total) by mouth 2 (two) times daily. 01/29/21   Cloria Spring, MD  fexofenadine (ALLEGRA) 180 MG tablet TAKE ONE TABLET BY MOUTH DAILY FOR ALLERGY SYMPTOMS 03/18/21   Claretta Fraise, MD   gabapentin (NEURONTIN) 300 MG capsule Take 300 mg by mouth 3 (three) times daily. 06/15/18   [provider]  ipratropium-albuterol (DUONEB) 0.5-2.5 (3) MG/3ML SOLN Take 3 mLs by nebulization every 6 (six) hours as needed. 02/28/20   Claretta Fraise, MD  lisinopril (ZESTRIL) 20 MG tablet Take 1 tablet (20 mg total) by mouth daily. (NEEDS TO BE SEEN BEFORE NEXT REFILL) 03/13/21   Claretta Fraise, MD  magic mouthwash (nystatin, lidocaine, diphenhydrAMINE, alum & mag hydroxide) suspension Swish and spit 5 mLs 4 (four) times daily as needed for mouth pain. 10/15/20   Claretta Fraise, MD  meloxicam (MOBIC) 15 MG tablet Take 15 mg by mouth daily. 11/26/19   [provider]  methocarbamol (ROBAXIN) 500 MG tablet Take 1 tablet (500 mg total) by mouth 2 (two) times daily. 02/12/21   Claretta Fraise, MD  oxyCODONE (ROXICODONE) 15 MG immediate release tablet Take 15 mg by mouth 4 (four) times daily as needed. 03/08/20   [provider]  pantoprazole (PROTONIX) 40 MG tablet Take 1 tablet (40 mg total) by mouth 2 (two) times daily. (NEEDS TO BE SEEN BEFORE NEXT REFILL) 03/13/21   Claretta Fraise, MD  predniSONE (DELTASONE) 10 MG tablet Take 5 daily for 3 days followed by 4,3,2 and 1 for 3 days each. 02/12/21   Claretta Fraise, MD  SPIRIVA RESPIMAT 2.5 MCG/ACT AERS INHALE TWO PUFFS INTO THE LUNGS DAILY 11/09/20   Claretta Fraise, MD  sucralfate (CARAFATE) 1 g tablet TAKE ONE TABLET BY MOUTH TWICE DAILY 08/13/20   Claretta Fraise, MD  Vitamin D, Ergocalciferol, (DRISDOL) 1.25 MG (50000 UNIT) CAPS capsule Take 1 capsule (50,000 Units total) by mouth every 7 (seven) days. 08/31/20   Claretta Fraise, MD    Physical Exam: BP 111/71    Pulse 81    Temp 97.7 F (36.5 C) (Oral)    Resp (!) 21    Ht 5\' 5"  (1.651 m)    Wt 67.4 kg    SpO2 92%    BMI 24.73 kg/m   General: 66 y.o.  year-old female well developed well nourished in no acute distress.  Alert and oriented x3. HEENT: NCAT, EOMI Neck: Supple, trachea  medial Cardiovascular: Regular rate and rhythm with no rubs or gallops.  No thyromegaly or JVD noted.  No lower extremity edema. 2/4 pulses in all 4 extremities. Respiratory: Scattered wheezes, no rales on auscultation.  Abdomen: Soft, nontender nondistended with normal bowel sounds x4 quadrants. Muskuloskeletal: Tender to palpation of mid upper back and right chest wall.  No cyanosis, clubbing or edema noted bilaterally Neuro: CN II-XII intact, strength 5/5 x 4, sensation, reflexes intact Skin: No ulcerative lesions noted or rashes Psychiatry: Judgement and insight appear normal. Mood is appropriate for condition and setting          Labs on Admission:  Basic Metabolic Panel: Recent Labs  Lab 03/28/21 1133  NA 141  K 3.4*  CL 107  CO2 26  GLUCOSE 116*  BUN 24*  CREATININE 0.72  CALCIUM 9.0   Liver Function Tests: Recent Labs  Lab 03/28/21 1133  AST 19  ALT 16  ALKPHOS 103  BILITOT 0.3  PROT 6.8  ALBUMIN 3.7   No results for input(s): LIPASE, AMYLASE in the last 168 hours. No results for input(s): AMMONIA in the last 168 hours. CBC: Recent Labs  Lab 03/28/21 1133  WBC 20.9*  NEUTROABS 18.5*  HGB 13.8  HCT 43.0  MCV 94.5  PLT 480*   Cardiac Enzymes: No results for input(s): CKTOTAL, CKMB, CKMBINDEX, TROPONINI in the last 168 hours.  BNP (last 3 results) No results for input(s): BNP in the last 8760 hours.  ProBNP (last 3 results) No results for input(s): PROBNP in the last 8760 hours.  CBG: No results for input(s): GLUCAP in the last 168 hours.  Radiological Exams on Admission: CT Angio Chest PE W and/or Wo Contrast  Result Date: 03/28/2021 CLINICAL DATA:  Posterior chest pain.  Elevated D-dimer. EXAM: CT ANGIOGRAPHY CHEST WITH CONTRAST TECHNIQUE: Multidetector CT imaging of the chest was performed using the standard protocol during bolus administration of intravenous contrast. Multiplanar CT image reconstructions and MIPs were obtained to evaluate the  vascular anatomy. RADIATION DOSE REDUCTION: This exam was performed according to the departmental dose-optimization program which includes automated exposure control, adjustment of the mA and/or kV according to patient size and/or use of iterative reconstruction technique. CONTRAST:  50mL OMNIPAQUE IOHEXOL 350 MG/ML SOLN COMPARISON:  Thoracic spine CT 03/26/2021.  Chest CT 03/22/2020 FINDINGS: Cardiovascular: No filling defect is identified in the pulmonary arterial tree to suggest pulmonary embolus. Coronary, aortic arch, and branch vessel atherosclerotic vascular disease. Aortic valve calcifications noted. Mediastinum/Nodes: 0.6 cm an adjacent 0.7 cm left level IV lymph nodes in the lower neck on images 3 and 7 of series 4. Roughly stable from 03/22/2020. No pathologic adenopathy is identified. Lungs/Pleura: Mild scarring anteriorly in the right upper lobe just above the minor fissure, stable. Mild atelectasis or scarring in the right lower lobe adjacent to the major fissure, new from 03/22/2020. Linear bandlike opacity in the right lower lobe on image 112 of series 6 favors atelectasis or scarring and is new from 03/22/2020. Ground-glass density 5 by 4 mm right upper lobe nodule on image 61 series 6, previously 0.4 cm on 03/22/2020. Emphysema noted. Upper Abdomen: 1.6 by 1.4 cm left adrenal mass with internal density -12 Hounsfield units compatible with adenoma. Fluid density renal lesions are probably cysts although not fully characterized. Musculoskeletal: 45% compression fracture at T5 with about 2 mm of posterior retropulsion along  the inferior endplate of L5. There is associated sclerosis and accordingly this is likely subacute. Healing nondisplaced fracture of the right anterior fourth rib on image 74 series 6 Review of the MIP images confirms the above findings. IMPRESSION: 1. No filling defect is identified in the pulmonary arterial tree to suggest pulmonary embolus. 2. 45% compression fracture T5 with 2  mm posterior bony retropulsion along the inferior endplate. The vertebral body is sclerotic and accordingly this fracture is likely subacute. 3. Healing nondisplaced fracture the right anterior fourth rib. 4. 5 by 4 mm ground-glass density right upper lobe pulmonary nodule on image 61 series 6, previously 0.4 cm on 03/22/2020. Subjectively this appears mildly larger, and was even less apparent on chest CT from 03/06/2019. Given the patient's smoking history, surveillance by chest CT in 12 months time is recommended. 5. Other imaging findings of potential clinical significance: Aortic Atherosclerosis (ICD10-I70.0) and Emphysema (ICD10-J43.9). Coronary and systemic atherosclerosis. Aortic valve calcifications. Scarring versus mild atelectasis in the right lower lobe. Left adrenal adenoma. Electronically Signed   By: Van Clines M.D.   On: 03/28/2021 14:40   DG Chest Port 1 View  Result Date: 03/28/2021 CLINICAL DATA:  Back pain and pleuritic chest pain status post lifting injury 2 weeks prior to imaging. EXAM: PORTABLE CHEST 1 VIEW COMPARISON:  Multiple exams, including thoracic spine CT from [facility] rocking hand dated 03/26/2021 and chest radiograph from 02/05/2021 FINDINGS: The previously demonstrated 40% T5 vertebral body compression fracture is poorly appreciated on today's frontal chest radiograph. Atherosclerotic calcification of the aortic arch. Suspected linear subsegmental atelectasis at the lung bases; otherwise the lungs appear clear. No blunting of the costophrenic angles. Cardiac and mediastinal margins appear normal. IMPRESSION: 1. Bibasilar linear subsegmental atelectasis. Otherwise the lungs appear clear. 2.  Aortic Atherosclerosis (ICD10-I70.0). 3. The patient has a known acute or subacute T5 compression fracture shown on thoracic spine CT from 03/26/2021. Electronically Signed   By: Van Clines M.D.   On: 03/28/2021 11:56    EKG: I independently viewed the EKG done and my findings are  as followed: Normal sinus rhythm at a rate of 82 bpm with VPCs  Assessment/Plan Present on Admission:  COPD with acute exacerbation ([place])  Essential hypertension  Major depression  Principal Problem:   COPD with acute exacerbation ([place]) Active Problems:   Essential hypertension   Hyperlipidemia, unspecified   Major depression   Back pain   Hypokalemia   Elevated d-dimer   Leukocytosis   Thrombocytosis   Compression fracture of T5 vertebra (HCC)   Pulmonary nodule   Acute respiratory failure with hypoxia (HCC)   GERD (gastroesophageal reflux disease)   CAD (coronary artery disease)   Anxiety  Acute respiratory failure with hypoxia possibly due to presumed COPD exacerbation Continue duo nebs, Mucinex, Solu-Medrol, Dulera, Spiriva, azithromycin. Continue Protonix to prevent steroid-induced ulcer Continue incentive spirometry and flutter valve Continue supplemental oxygen to maintain O2 sat > 92% with plan to wean patient off oxygen as tolerated  Back pain secondary to compression fracture Continue oxycodone as needed Continue Flexeril as needed TLSO brace will be ordered Continue PT/OT eval and treat  Acute on chronic leukocytosis possibly reactive WBC 20.9, no obvious source of infection, continue to monitor WBC with morning labs  Thrombocytosis possibly reactive Platelets 480, continue to monitor platelet levels morning labs  Hypokalemia K+ 3.4; this will be replenished  Elevated D-dimer D-dimer 1.36, CT angiography of chest ruled out PE  Pulmonary nodule CT angiography of chest showed 5  by 4 mm ground-glass density right upper lobe pulmonary nodule previously 0.4 cm on 03/22/2020. Chest CT in 12 months was recommended  Essential hypertension BP meds will be held at this time due to soft BP  Hyperlipidemia Continue Lipitor  CAD Continue aspirin and Lipitor  GERD Continue Protonix  Depression and anxiety Continue Cymbalta and Wellbutrin   DVT  prophylaxis: Lovenox  Code Status: Full code  Family Communication: None at bedside  Disposition Plan:  Patient is from:                        home Anticipated DC to:                   SNF or family members home Anticipated DC date:               2-3 days Anticipated DC barriers:         Patient requires inpatient management due to respiratory failure with hypoxia requiring supplemental oxygen  Consults called: None  Admission status: Observation    Bernadette Hoit MD [facility]  03/29/2021, 12:56 AM

## 2021-03-28 NOTE — ED Notes (Signed)
Patient states [facility] and has diminished bilateral lung sounds. Patient 86% withoput her oxygen on and is now 92% on 2L/[place].

## 2021-03-29 DIAGNOSIS — E876 Hypokalemia: Secondary | ICD-10-CM

## 2021-03-29 DIAGNOSIS — M549 Dorsalgia, unspecified: Secondary | ICD-10-CM

## 2021-03-29 DIAGNOSIS — I2510 Atherosclerotic heart disease of native coronary artery without angina pectoris: Secondary | ICD-10-CM

## 2021-03-29 DIAGNOSIS — S22050A Wedge compression fracture of T5-T6 vertebra, initial encounter for closed fracture: Secondary | ICD-10-CM

## 2021-03-29 DIAGNOSIS — S2231XA Fracture of one rib, right side, initial encounter for closed fracture: Secondary | ICD-10-CM | POA: Diagnosis not present

## 2021-03-29 DIAGNOSIS — K219 Gastro-esophageal reflux disease without esophagitis: Secondary | ICD-10-CM

## 2021-03-29 DIAGNOSIS — R911 Solitary pulmonary nodule: Secondary | ICD-10-CM

## 2021-03-29 DIAGNOSIS — J441 Chronic obstructive pulmonary disease with (acute) exacerbation: Secondary | ICD-10-CM | POA: Diagnosis not present

## 2021-03-29 DIAGNOSIS — F419 Anxiety disorder, unspecified: Secondary | ICD-10-CM

## 2021-03-29 DIAGNOSIS — D72829 Elevated white blood cell count, unspecified: Secondary | ICD-10-CM

## 2021-03-29 DIAGNOSIS — J9601 Acute respiratory failure with hypoxia: Secondary | ICD-10-CM

## 2021-03-29 DIAGNOSIS — D75839 Thrombocytosis, unspecified: Secondary | ICD-10-CM

## 2021-03-29 DIAGNOSIS — R7989 Other specified abnormal findings of blood chemistry: Secondary | ICD-10-CM

## 2021-03-29 LAB — PHOSPHORUS: Phosphorus: 3.8 mg/dL (ref 2.5–4.6)

## 2021-03-29 LAB — HIV ANTIBODY (ROUTINE TESTING W REFLEX): HIV Screen 4th Generation wRfx: NONREACTIVE

## 2021-03-29 LAB — COMPREHENSIVE METABOLIC PANEL
ALT: 13 U/L (ref 0–44)
AST: 12 U/L — ABNORMAL LOW (ref 15–41)
Albumin: 3.2 g/dL — ABNORMAL LOW (ref 3.5–5.0)
Alkaline Phosphatase: 83 U/L (ref 38–126)
Anion gap: 8 (ref 5–15)
BUN: 27 mg/dL — ABNORMAL HIGH (ref 8–23)
CO2: 27 mmol/L (ref 22–32)
Calcium: 8.8 mg/dL — ABNORMAL LOW (ref 8.9–10.3)
Chloride: 106 mmol/L (ref 98–111)
Creatinine, Ser: 0.76 mg/dL (ref 0.44–1.00)
GFR, Estimated: >60 mL/min (ref >60)
Glucose, Bld: 136 mg/dL — ABNORMAL HIGH (ref 70–99)
Potassium: 3.9 mmol/L (ref 3.5–5.1)
Sodium: 141 mmol/L (ref 135–145)
Total Bilirubin: 0.2 mg/dL — ABNORMAL LOW (ref 0.3–1.2)
Total Protein: 6 g/dL — ABNORMAL LOW (ref 6.5–8.1)

## 2021-03-29 LAB — CBC
HCT: 37.6 % (ref 36.0–46.0)
Hemoglobin: 11.8 g/dL — ABNORMAL LOW (ref 12.0–15.0)
MCH: 29.6 pg (ref 26.0–34.0)
MCHC: 31.4 g/dL (ref 30.0–36.0)
MCV: 94.5 fL (ref 80.0–100.0)
Platelets: 411 10*3/uL — ABNORMAL HIGH (ref 150–400)
RBC: 3.98 MIL/uL (ref 3.87–5.11)
RDW: 15.7 % — ABNORMAL HIGH (ref 11.5–15.5)
WBC: 11.4 10*3/uL — ABNORMAL HIGH (ref 4.0–10.5)
nRBC: 0 % (ref 0.0–0.2)

## 2021-03-29 LAB — APTT: aPTT: 23 seconds — ABNORMAL LOW (ref 24–36)

## 2021-03-29 LAB — MRSA NEXT GEN BY PCR, NASAL: MRSA by PCR Next Gen: NOT DETECTED

## 2021-03-29 LAB — MAGNESIUM: Magnesium: 2 mg/dL (ref 1.7–2.4)

## 2021-03-29 MED ORDER — UMECLIDINIUM BROMIDE 62.5 MCG/ACT IN AEPB
1.0000 | INHALATION_SPRAY | Freq: Every day | RESPIRATORY_TRACT | Status: DC
  Administered 2021-03-29: 1 via RESPIRATORY_TRACT
  Filled 2021-03-29: qty 7

## 2021-03-29 MED ORDER — BUPROPION HCL ER (XL) 150 MG PO TB24
300.0000 mg | ORAL_TABLET | Freq: Every day | ORAL | Status: DC
  Administered 2021-03-29: 300 mg via ORAL
  Filled 2021-03-29: qty 2

## 2021-03-29 MED ORDER — MOMETASONE FURO-FORMOTEROL FUM 200-5 MCG/ACT IN AERO
2.0000 | INHALATION_SPRAY | Freq: Two times a day (BID) | RESPIRATORY_TRACT | Status: DC
  Administered 2021-03-29: 2 via RESPIRATORY_TRACT
  Filled 2021-03-29: qty 8.8

## 2021-03-29 MED ORDER — AZITHROMYCIN 250 MG PO TABS
500.0000 mg | ORAL_TABLET | Freq: Every day | ORAL | Status: AC
  Administered 2021-03-29: 500 mg via ORAL
  Filled 2021-03-29: qty 2

## 2021-03-29 MED ORDER — POTASSIUM CHLORIDE CRYS ER 20 MEQ PO TBCR
40.0000 meq | EXTENDED_RELEASE_TABLET | Freq: Once | ORAL | Status: AC
  Administered 2021-03-29: 40 meq via ORAL
  Filled 2021-03-29: qty 2

## 2021-03-29 MED ORDER — IPRATROPIUM-ALBUTEROL 0.5-2.5 (3) MG/3ML IN SOLN: 3.0000 mL | Freq: Four times a day (QID) | RESPIRATORY_TRACT | Status: DC | PRN

## 2021-03-29 MED ORDER — IPRATROPIUM-ALBUTEROL 0.5-2.5 (3) MG/3ML IN SOLN
3.0000 mL | Freq: Two times a day (BID) | RESPIRATORY_TRACT | Status: DC
  Administered 2021-03-29: 3 mL via RESPIRATORY_TRACT
  Filled 2021-03-29: qty 3

## 2021-03-29 MED ORDER — IPRATROPIUM-ALBUTEROL 0.5-2.5 (3) MG/3ML IN SOLN: 3.0000 mL | RESPIRATORY_TRACT | Status: DC | PRN

## 2021-03-29 MED ORDER — IPRATROPIUM-ALBUTEROL 0.5-2.5 (3) MG/3ML IN SOLN: 3.0000 mL | Freq: Four times a day (QID) | RESPIRATORY_TRACT | Status: DC

## 2021-03-29 MED ORDER — PANTOPRAZOLE SODIUM 40 MG PO TBEC: 40.0000 mg | DELAYED_RELEASE_TABLET | Freq: Every day | ORAL | Status: DC

## 2021-03-29 MED ORDER — ASPIRIN EC 81 MG PO TBEC
81.0000 mg | DELAYED_RELEASE_TABLET | Freq: Every day | ORAL | Status: DC
  Administered 2021-03-29: 81 mg via ORAL
  Filled 2021-03-29: qty 1

## 2021-03-29 MED ORDER — CYCLOBENZAPRINE HCL 10 MG PO TABS: 5.0000 mg | ORAL_TABLET | Freq: Three times a day (TID) | ORAL | Status: DC | PRN

## 2021-03-29 MED ORDER — METHYLPREDNISOLONE SODIUM SUCC 40 MG IJ SOLR
40.0000 mg | Freq: Two times a day (BID) | INTRAMUSCULAR | Status: DC
  Administered 2021-03-29: 40 mg via INTRAVENOUS
  Filled 2021-03-29: qty 1

## 2021-03-29 MED ORDER — TIOTROPIUM BROMIDE MONOHYDRATE 2.5 MCG/ACT IN AERS: 2.0000 | INHALATION_SPRAY | Freq: Every day | RESPIRATORY_TRACT | Status: DC

## 2021-03-29 MED ORDER — DULOXETINE HCL 60 MG PO CPEP
60.0000 mg | ORAL_CAPSULE | Freq: Two times a day (BID) | ORAL | Status: DC
  Administered 2021-03-29 (×2): 60 mg via ORAL
  Filled 2021-03-29 (×2): qty 1

## 2021-03-29 MED ORDER — PANTOPRAZOLE SODIUM 40 MG PO TBEC
40.0000 mg | DELAYED_RELEASE_TABLET | Freq: Every day | ORAL | Status: DC
  Administered 2021-03-29: 40 mg via ORAL
  Filled 2021-03-29: qty 1

## 2021-03-29 MED ORDER — ATORVASTATIN CALCIUM 40 MG PO TABS
40.0000 mg | ORAL_TABLET | Freq: Every day | ORAL | Status: DC
  Administered 2021-03-29: 40 mg via ORAL
  Filled 2021-03-29: qty 1

## 2021-03-29 MED ORDER — OXYCODONE HCL 5 MG PO TABS
10.0000 mg | ORAL_TABLET | ORAL | Status: DC | PRN
  Administered 2021-03-29 (×3): 10 mg via ORAL
  Filled 2021-03-29 (×3): qty 2

## 2021-03-29 MED ORDER — AZITHROMYCIN 250 MG PO TABS: 250.0000 mg | ORAL_TABLET | Freq: Every day | ORAL | Status: DC

## 2021-03-29 MED ORDER — DM-GUAIFENESIN ER 30-600 MG PO TB12
1.0000 | ORAL_TABLET | Freq: Two times a day (BID) | ORAL | Status: DC
  Administered 2021-03-29 (×2): 1 via ORAL
  Filled 2021-03-29 (×2): qty 1

## 2021-03-29 MED ORDER — PREDNISONE 20 MG PO TABS: 40.0000 mg | ORAL_TABLET | Freq: Every day | ORAL | 0 refills | Status: AC

## 2021-03-29 MED ORDER — DM-GUAIFENESIN ER 30-600 MG PO TB12: 1.0000 | ORAL_TABLET | Freq: Two times a day (BID) | ORAL | 0 refills | Status: AC

## 2021-03-29 NOTE — TOC Progression Note (Signed)
Transition of Care [facility]) - Progression Note    Patient Details  Name: [name] MRN: [id number] Date of Birth: Jan 30, 1956  [facility] CM/SW Contact  Boneta Lucks, RN Phone Number: 03/29/2021, 10:17 AM  Clinical Narrative:     [facility] consulted for TLSO brace. CM called [facility]) [phone]. They took patient room, height, weight and will come to assess and measure. They are short staffed and could not give me an ETA.                                           Social Determinants of Health (SDOH) Interventions    Readmission Risk Interventions No flowsheet data found.

## 2021-03-29 NOTE — Discharge Summary (Signed)
Physician Discharge Summary  [name] UXN:[id number] DOB: 20-Dec-1955 DOA: 03/28/2021  PCP: Claretta Fraise, MD  Admit date: 03/28/2021  Discharge date: 03/29/2021  Admitted From:Home  Disposition:  Home  Recommendations for Outpatient Follow-up:  Follow up with PCP in 1-2 weeks Information given for [facility] [place] to follow-up with if desired Continue pain medications as prior Continue prednisone 40 mg daily for 5 more days as prescribed and use inhaler at home as needed for shortness of breath or wheezing TLSO brace provided prior to discharge  Home Health: None  Equipment/Devices: TLSO brace  Discharge Condition:Stable  CODE STATUS: Full  Diet recommendation: Heart Healthy  Brief/Interim Summary:  [name] is a 66 y.o. female with medical history significant for COPD, hypertension, CAD, hyperlipidemia, GERD who presents to the emergency department due to 1 month of onset of mid back pain which started after helping to lift her up her sister-in-law from the floor after sustaining a fall, about 2 weeks ago, the pain worsened without sustaining any new injury and she also complained of left shoulder pain.  She was also noted to have some acute hypoxemic respiratory failure with mild COPD exacerbation for which she was started on breathing treatments as well as IV and inhaled steroids.  She is feeling much better from the standpoint and has been weaned off of her oxygen supplementation.  She has been seen by PT with no home recommendations noted and will be fitted for a brace prior to discharge.  She has pain medications as needed and has been given information for neurosurgery in [place] to follow-up with to address her T5 compression fracture.  No other acute events noted throughout the course of the stay and she is stable for discharge.  Discharge Diagnoses:  Principal Problem:   COPD with acute exacerbation ([place]) Active Problems:   Essential  hypertension   Hyperlipidemia, unspecified   Major depression   Back pain   Hypokalemia   Elevated d-dimer   Leukocytosis   Thrombocytosis   Compression fracture of T5 vertebra (HCC)   Pulmonary nodule   Acute respiratory failure with hypoxia (HCC)   GERD (gastroesophageal reflux disease)   CAD (coronary artery disease)   Anxiety  Principal discharge diagnosis: Acute hypoxemic respiratory failure secondary to presumed COPD exacerbation.  Ongoing back pain with subacute T5 compression fracture.  Discharge Instructions  Discharge Instructions     Diet - low sodium heart healthy   Complete by: As directed    Incentive spirometry RT   Complete by: As directed    Increase activity slowly   Complete by: As directed       Allergies as of 03/29/2021       Reactions   Lorcet 10-650 [hydrocodone-acetaminophen] Itching   Sulfa Antibiotics Other (See Comments)   aching   Tape Other (See Comments)   Please use paper tape        Medication List     STOP taking these medications    methocarbamol 500 MG tablet Commonly known as: ROBAXIN   pantoprazole 40 MG tablet Commonly known as: PROTONIX   sucralfate 1 g tablet Commonly known as: CARAFATE       TAKE these medications    albuterol 108 (90 Base) MCG/ACT inhaler Commonly known as: VENTOLIN HFA Inhale 2 puffs into the lungs every 6 (six) hours as needed for wheezing or shortness of breath.   amLODipine 10 MG tablet Commonly known as: NORVASC TAKE ONE TABLET BY MOUTH DAILY  aspirin EC 81 MG tablet Take 81 mg by mouth daily.   atorvastatin 40 MG tablet Commonly known as: LIPITOR Take 1 tablet (40 mg total) by mouth at bedtime. (NEEDS TO BE SEEN BEFORE NEXT REFILL)   budesonide-formoterol 160-4.5 MCG/ACT inhaler Commonly known as: Symbicort INHALE TWO PUFFS INTO THE LUNGS TWICE DAILY   buPROPion 300 MG 24 hr tablet Commonly known as: Wellbutrin XL Take 1 tablet (300 mg total) by mouth daily.    cholecalciferol 25 MCG (1000 UNIT) tablet Commonly known as: VITAMIN D3 Take 1,000 Units by mouth daily.   clonazePAM 1 MG tablet Commonly known as: KLONOPIN Take 1 tablet (1 mg total) by mouth 3 (three) times daily as needed. for anxiety   cloNIDine 0.1 MG tablet Commonly known as: CATAPRES Take 1 tablet (0.1 mg total) by mouth at bedtime. (NEEDS TO BE SEEN BEFORE NEXT REFILL)   cyclobenzaprine 5 MG tablet Commonly known as: FLEXERIL Take 5 mg by mouth 3 (three) times daily as needed.   dexlansoprazole 60 MG capsule Commonly known as: DEXILANT TAKE ONE CAPSULE BY MOUTH DAILY ON AN EMPTY STOMACH, DO NOT EAT FOR ONE HOUR (NEEDS TO BE SEEN BEFORE NEXT REFILL)   dextromethorphan-guaiFENesin 30-600 MG 12hr tablet Commonly known as: MUCINEX DM Take 1 tablet by mouth 2 (two) times daily for 7 days.   DULoxetine 60 MG capsule Commonly known as: CYMBALTA Take 1 capsule (60 mg total) by mouth 2 (two) times daily.   fexofenadine 180 MG tablet Commonly known as: ALLEGRA TAKE ONE TABLET BY MOUTH DAILY FOR ALLERGY SYMPTOMS What changed: See the new instructions.   gabapentin 300 MG capsule Commonly known as: NEURONTIN Take 300 mg by mouth 3 (three) times daily.   Ilevro 0.3 % ophthalmic suspension Generic drug: nepafenac Place 1 drop into the left eye daily.   ipratropium-albuterol 0.5-2.5 (3) MG/3ML Soln Commonly known as: DUONEB Take 3 mLs by nebulization every 6 (six) hours as needed.   lidocaine 5 % Commonly known as: Lidoderm Place 1 patch onto the skin daily. Remove & Discard patch within 12 hours or as directed by MD What changed:  how much to take when to take this additional instructions   lisinopril 20 MG tablet Commonly known as: ZESTRIL Take 1 tablet (20 mg total) by mouth daily. (NEEDS TO BE SEEN BEFORE NEXT REFILL)   magic mouthwash (nystatin, lidocaine, diphenhydrAMINE, alum & mag hydroxide) suspension Swish and spit 5 mLs 4 (four) times daily as needed  for mouth pain.   meloxicam 15 MG tablet Commonly known as: MOBIC Take 15 mg by mouth daily.   moxifloxacin 0.5 % ophthalmic solution Commonly known as: VIGAMOX Apply 1 drop to eye 3 (three) times daily.   oxyCODONE 15 MG immediate release tablet Commonly known as: ROXICODONE Take 15 mg by mouth 4 (four) times daily as needed.   prednisoLONE acetate 1 % ophthalmic suspension Commonly known as: PRED FORTE 1 drop 3 (three) times daily.   predniSONE 20 MG tablet Commonly known as: DELTASONE Take 2 tablets (40 mg total) by mouth daily with breakfast for 5 days. For 5 days What changed: Another medication with the same name was removed. Continue taking this medication, and follow the directions you see here.   Spiriva Respimat 2.5 MCG/ACT Aers Generic drug: Tiotropium Bromide Monohydrate INHALE TWO PUFFS INTO THE LUNGS DAILY What changed: See the new instructions.   vitamin C 1000 MG tablet Take 1,000 mg by mouth daily.   Vitamin D (Ergocalciferol) 1.25 MG (  50000 UNIT) Caps capsule Commonly known as: DRISDOL Take 1 capsule (50,000 Units total) by mouth every 7 (seven) days. What changed: additional instructions        Follow-up Information     Stacks, Cletus Gash, MD. Schedule an appointment as soon as possible for a visit in 1 week(s).   Specialty: Family Medicine Contact information: [place] [address] [place], [facility]. Schedule an appointment as soon as possible for a visit in 1 week(s).   Specialty: Neurosurgery Contact information: [address] [place] [address] [phone]                Allergies  Allergen Reactions   Lorcet 10-650 [Hydrocodone-Acetaminophen] Itching   Sulfa Antibiotics Other (See Comments)    aching   Tape Other (See Comments)    Please use paper tape    Consultations: None   Procedures/Studies: CT Angio Chest PE W and/or Wo  Contrast  Result Date: 03/28/2021 CLINICAL DATA:  Posterior chest pain.  Elevated D-dimer. EXAM: CT ANGIOGRAPHY CHEST WITH CONTRAST TECHNIQUE: Multidetector CT imaging of the chest was performed using the standard protocol during bolus administration of intravenous contrast. Multiplanar CT image reconstructions and MIPs were obtained to evaluate the vascular anatomy. RADIATION DOSE REDUCTION: This exam was performed according to the departmental dose-optimization program which includes automated exposure control, adjustment of the mA and/or kV according to patient size and/or use of iterative reconstruction technique. CONTRAST:  65mL OMNIPAQUE IOHEXOL 350 MG/ML SOLN COMPARISON:  Thoracic spine CT 03/26/2021.  Chest CT 03/22/2020 FINDINGS: Cardiovascular: No filling defect is identified in the pulmonary arterial tree to suggest pulmonary embolus. Coronary, aortic arch, and branch vessel atherosclerotic vascular disease. Aortic valve calcifications noted. Mediastinum/Nodes: 0.6 cm an adjacent 0.7 cm left level IV lymph nodes in the lower neck on images 3 and 7 of series 4. Roughly stable from 03/22/2020. No pathologic adenopathy is identified. Lungs/Pleura: Mild scarring anteriorly in the right upper lobe just above the minor fissure, stable. Mild atelectasis or scarring in the right lower lobe adjacent to the major fissure, new from 03/22/2020. Linear bandlike opacity in the right lower lobe on image 112 of series 6 favors atelectasis or scarring and is new from 03/22/2020. Ground-glass density 5 by 4 mm right upper lobe nodule on image 61 series 6, previously 0.4 cm on 03/22/2020. Emphysema noted. Upper Abdomen: 1.6 by 1.4 cm left adrenal mass with internal density -12 Hounsfield units compatible with adenoma. Fluid density renal lesions are probably cysts although not fully characterized. Musculoskeletal: 45% compression fracture at T5 with about 2 mm of posterior retropulsion along the inferior endplate of L5.  There is associated sclerosis and accordingly this is likely subacute. Healing nondisplaced fracture of the right anterior fourth rib on image 74 series 6 Review of the MIP images confirms the above findings. IMPRESSION: 1. No filling defect is identified in the pulmonary arterial tree to suggest pulmonary embolus. 2. 45% compression fracture T5 with 2 mm posterior bony retropulsion along the inferior endplate. The vertebral body is sclerotic and accordingly this fracture is likely subacute. 3. Healing nondisplaced fracture the right anterior fourth rib. 4. 5 by 4 mm ground-glass density right upper lobe pulmonary nodule on image 61 series 6, previously 0.4 cm on 03/22/2020. Subjectively this appears mildly larger, and was even less apparent on chest CT from 03/06/2019. Given the patient's smoking history, surveillance by chest CT in  12 months time is recommended. 5. Other imaging findings of potential clinical significance: Aortic Atherosclerosis (ICD10-I70.0) and Emphysema (ICD10-J43.9). Coronary and systemic atherosclerosis. Aortic valve calcifications. Scarring versus mild atelectasis in the right lower lobe. Left adrenal adenoma. Electronically Signed   By: Van Clines M.D.   On: 03/28/2021 14:40   MR Shoulder Left Wo Contrast  Result Date: 02/27/2021 CLINICAL DATA:  Left shoulder pain related to pulling herself up in bed 4 weeks ago, and notes pain radiating to mid back and feels like it is ripping into 2. Clinical concern for rotator cuff tear. EXAM: MRI OF THE LEFT SHOULDER WITHOUT CONTRAST TECHNIQUE: Multiplanar, multisequence MR imaging of the shoulder was performed. No intravenous contrast was administered. COMPARISON:  None. FINDINGS: Rotator cuff: There is distal supraspinatus and infraspinatus tendinosis with bursal-sided fraying. There is a focal, intermediate grade bursal-sided tear of the far anterior fibers of the supraspinatus tendon at the footprint (coronal T2 image 13, sagittal  image 6). Teres minor tendon is intact. Subscapularis tendon is intact. Muscles: No significant muscle atrophy. Biceps Long Head: Intraarticular and extraarticular portions of the biceps tendon are intact. Acromioclavicular Joint: Mild arthropathy of the acromioclavicular joint. Small amount of subacromial/subdeltoid bursal fluid. Glenohumeral Joint: No joint effusion. Mild chondrosis. Labrum: Degenerative posterosuperior labral tearing. Bones: No fracture or dislocation. No aggressive osseous lesion. Other: No fluid collection or hematoma. IMPRESSION: 1. Distal supraspinatus and infraspinatus tendinosis with bursal-sided fraying. Focal, intermediate grade bursal-sided tear of the far anterior fibers of the supraspinatus tendon at the footprint. No significant muscle atrophy. 2. Mild subacromial-subdeltoid bursitis.  Mild AC joint arthropathy. 3. Degenerative posterosuperior labral tearing. Electronically Signed   By: Maurine Simmering M.D.   On: 02/27/2021 17:00   DG Chest Port 1 View  Result Date: 03/28/2021 CLINICAL DATA:  Back pain and pleuritic chest pain status post lifting injury 2 weeks prior to imaging. EXAM: PORTABLE CHEST 1 VIEW COMPARISON:  Multiple exams, including thoracic spine CT from [facility] rocking hand dated 03/26/2021 and chest radiograph from 02/05/2021 FINDINGS: The previously demonstrated 40% T5 vertebral body compression fracture is poorly appreciated on today's frontal chest radiograph. Atherosclerotic calcification of the aortic arch. Suspected linear subsegmental atelectasis at the lung bases; otherwise the lungs appear clear. No blunting of the costophrenic angles. Cardiac and mediastinal margins appear normal. IMPRESSION: 1. Bibasilar linear subsegmental atelectasis. Otherwise the lungs appear clear. 2.  Aortic Atherosclerosis (ICD10-I70.0). 3. The patient has a known acute or subacute T5 compression fracture shown on thoracic spine CT from 03/26/2021. Electronically Signed   By: Van Clines M.D.   On: 03/28/2021 11:56     Discharge Exam: Vitals:   03/29/21 1000 03/29/21 1100  BP:    Pulse: 99 91  Resp:    Temp:    SpO2: 91% 93%   Vitals:   03/29/21 0900 03/29/21 0940 03/29/21 1000 03/29/21 1100  BP:      Pulse: 100 99 99 91  Resp:      Temp:      TempSrc:      SpO2: 91% 92% 91% 93%  Weight:      Height:        General: Pt is alert, awake, not in acute distress Cardiovascular: RRR, S1/S2 +, no rubs, no gallops Respiratory: CTA bilaterally, no wheezing, no rhonchi Abdominal: Soft, NT, ND, bowel sounds + Extremities: no edema, no cyanosis    The results of significant diagnostics from this hospitalization (including imaging, microbiology, ancillary and laboratory) are listed below for  reference.     Microbiology: Recent Results (from the past 240 hour(s))  Resp Panel by RT-PCR (Flu A&B, Covid) Nasopharyngeal Swab     Status: None   Collection Time: 03/28/21  8:10 PM   Specimen: Nasopharyngeal Swab; Nasopharyngeal(NP) swabs in vial transport medium  Result Value Ref Range Status   SARS Coronavirus 2 by RT PCR NEGATIVE NEGATIVE Final    Comment: (NOTE) SARS-CoV-2 target nucleic acids are NOT DETECTED.  The SARS-CoV-2 RNA is generally detectable in upper respiratory specimens during the acute phase of infection. The lowest concentration of SARS-CoV-2 viral copies this assay can detect is 138 copies/mL. A negative result does not preclude SARS-Cov-2 infection and should not be used as the sole basis for treatment or other patient management decisions. A negative result may occur with  improper specimen collection/handling, submission of specimen other than nasopharyngeal swab, presence of viral mutation(s) within the areas targeted by this assay, and inadequate number of viral copies(<138 copies/mL). A negative result must be combined with clinical observations, patient history, and epidemiological information. The expected result is  Negative.  Fact Sheet for Patients:  EntrepreneurPulse.com.au  Fact Sheet for Healthcare Providers:  IncredibleEmployment.be  This test is no t yet approved or cleared by the [place] FDA and  has been authorized for detection and/or diagnosis of SARS-CoV-2 by FDA under an Emergency Use Authorization (EUA). This EUA will remain  in effect (meaning this test can be used) for the duration of the COVID-19 declaration under Section 564(b)(1) of the Act, 21 U.S.C.section 360bbb-3(b)(1), unless the authorization is terminated  or revoked sooner.       Influenza A by PCR NEGATIVE NEGATIVE Final   Influenza B by PCR NEGATIVE NEGATIVE Final    Comment: (NOTE) The Xpert Xpress SARS-CoV-2/FLU/RSV plus assay is intended as an aid in the diagnosis of influenza from Nasopharyngeal swab specimens and should not be used as a sole basis for treatment. Nasal washings and aspirates are unacceptable for Xpert Xpress SARS-CoV-2/FLU/RSV testing.  Fact Sheet for Patients: EntrepreneurPulse.com.au  Fact Sheet for Healthcare Providers: IncredibleEmployment.be  This test is not yet approved or cleared by the [place] FDA and has been authorized for detection and/or diagnosis of SARS-CoV-2 by FDA under an Emergency Use Authorization (EUA). This EUA will remain in effect (meaning this test can be used) for the duration of the COVID-19 declaration under Section 564(b)(1) of the Act, 21 U.S.C. section 360bbb-3(b)(1), unless the authorization is terminated or revoked.  Performed at [facility], [address], [place], [place] [address]   MRSA Next Gen by PCR, Nasal     Status: None   Collection Time: 03/28/21 11:37 PM   Specimen: Nasal Mucosa; Nasal Swab  Result Value Ref Range Status   MRSA by PCR Next Gen NOT DETECTED NOT DETECTED Final    Comment: (NOTE) The GeneXpert MRSA Assay (FDA approved for NASAL specimens  only), is one component of a comprehensive MRSA colonization surveillance program. It is not intended to diagnose MRSA infection nor to guide or monitor treatment for MRSA infections. Test performance is not FDA approved in patients less than 4 years old. Performed at [facility], [address], [place],  [address]      Labs: BNP (last 3 results) No results for input(s): BNP in the last 8760 hours. Basic Metabolic Panel: Recent Labs  Lab 03/28/21 1133 03/29/21 0349  NA 141 141  K 3.4* 3.9  CL 107 106  CO2 26 27  GLUCOSE 116* 136*  BUN 24* 27*  CREATININE 0.72 0.76  CALCIUM 9.0 8.8*  MG  --  2.0  PHOS  --  3.8   Liver Function Tests: Recent Labs  Lab 03/28/21 1133 03/29/21 0349  AST 19 12*  ALT 16 13  ALKPHOS 103 83  BILITOT 0.3 0.2*  PROT 6.8 6.0*  ALBUMIN 3.7 3.2*   No results for input(s): LIPASE, AMYLASE in the last 168 hours. No results for input(s): AMMONIA in the last 168 hours. CBC: Recent Labs  Lab 03/28/21 1133 03/29/21 0349  WBC 20.9* 11.4*  NEUTROABS 18.5*  --   HGB 13.8 11.8*  HCT 43.0 37.6  MCV 94.5 94.5  PLT 480* 411*   Cardiac Enzymes: No results for input(s): CKTOTAL, CKMB, CKMBINDEX, TROPONINI in the last 168 hours. BNP: Invalid input(s): POCBNP CBG: No results for input(s): GLUCAP in the last 168 hours. D-Dimer Recent Labs    03/28/21 1133  DDIMER 1.36*   Hgb A1c No results for input(s): HGBA1C in the last 72 hours. Lipid Profile No results for input(s): CHOL, HDL, LDLCALC, TRIG, CHOLHDL, LDLDIRECT in the last 72 hours. Thyroid function studies No results for input(s): TSH, T4TOTAL, T3FREE, THYROIDAB in the last 72 hours.  Invalid input(s): FREET3 Anemia work up No results for input(s): VITAMINB12, FOLATE, FERRITIN, TIBC, IRON, RETICCTPCT in the last 72 hours. Urinalysis    Component Value Date/Time   COLORURINE YELLOW 02/19/2017 0048   APPEARANCEUR Clear 04/08/2017 1656   LABSPEC 1.025 02/19/2017 0048    PHURINE 5.0 02/19/2017 0048   GLUCOSEU Negative 04/08/2017 1656   HGBUR NEGATIVE 02/19/2017 0048   BILIRUBINUR Negative 04/08/2017 1656   KETONESUR 5 (A) 02/19/2017 0048   PROTEINUR Trace (A) 04/08/2017 1656   PROTEINUR NEGATIVE 02/19/2017 0048   UROBILINOGEN negative 05/08/2014 1433   UROBILINOGEN 0.2 05/05/2011 2155   NITRITE Negative 04/08/2017 1656   NITRITE NEGATIVE 02/19/2017 0048   LEUKOCYTESUR Negative 04/08/2017 1656   Sepsis Labs Invalid input(s): PROCALCITONIN,  WBC,  LACTICIDVEN Microbiology Recent Results (from the past 240 hour(s))  Resp Panel by RT-PCR (Flu A&B, Covid) Nasopharyngeal Swab     Status: None   Collection Time: 03/28/21  8:10 PM   Specimen: Nasopharyngeal Swab; Nasopharyngeal(NP) swabs in vial transport medium  Result Value Ref Range Status   SARS Coronavirus 2 by RT PCR NEGATIVE NEGATIVE Final    Comment: (NOTE) SARS-CoV-2 target nucleic acids are NOT DETECTED.  The SARS-CoV-2 RNA is generally detectable in upper respiratory specimens during the acute phase of infection. The lowest concentration of SARS-CoV-2 viral copies this assay can detect is 138 copies/mL. A negative result does not preclude SARS-Cov-2 infection and should not be used as the sole basis for treatment or other patient management decisions. A negative result may occur with  improper specimen collection/handling, submission of specimen other than nasopharyngeal swab, presence of viral mutation(s) within the areas targeted by this assay, and inadequate number of viral copies(<138 copies/mL). A negative result must be combined with clinical observations, patient history, and epidemiological information. The expected result is Negative.  Fact Sheet for Patients:  EntrepreneurPulse.com.au  Fact Sheet for Healthcare Providers:  IncredibleEmployment.be  This test is no t yet approved or cleared by the [place] FDA and  has been authorized for  detection and/or diagnosis of SARS-CoV-2 by FDA under an Emergency Use Authorization (EUA). This EUA will remain  in effect (meaning this test can be used) for the duration of the COVID-19 declaration under Section 564(b)(1) of the Act, 21 U.S.C.section  360bbb-3(b)(1), unless the authorization is terminated  or revoked sooner.       Influenza A by PCR NEGATIVE NEGATIVE Final   Influenza B by PCR NEGATIVE NEGATIVE Final    Comment: (NOTE) The Xpert Xpress SARS-CoV-2/FLU/RSV plus assay is intended as an aid in the diagnosis of influenza from Nasopharyngeal swab specimens and should not be used as a sole basis for treatment. Nasal washings and aspirates are unacceptable for Xpert Xpress SARS-CoV-2/FLU/RSV testing.  Fact Sheet for Patients: EntrepreneurPulse.com.au  Fact Sheet for Healthcare Providers: IncredibleEmployment.be  This test is not yet approved or cleared by the [place] FDA and has been authorized for detection and/or diagnosis of SARS-CoV-2 by FDA under an Emergency Use Authorization (EUA). This EUA will remain in effect (meaning this test can be used) for the duration of the COVID-19 declaration under Section 564(b)(1) of the Act, 21 U.S.C. section 360bbb-3(b)(1), unless the authorization is terminated or revoked.  Performed at [facility], [address], [place], [place] [address]   MRSA Next Gen by PCR, Nasal     Status: None   Collection Time: 03/28/21 11:37 PM   Specimen: Nasal Mucosa; Nasal Swab  Result Value Ref Range Status   MRSA by PCR Next Gen NOT DETECTED NOT DETECTED Final    Comment: (NOTE) The GeneXpert MRSA Assay (FDA approved for NASAL specimens only), is one component of a comprehensive MRSA colonization surveillance program. It is not intended to diagnose MRSA infection nor to guide or monitor treatment for MRSA infections. Test performance is not FDA approved in patients less than 63  years old. Performed at [facility], [address] [place], [place] [address]      Time coordinating discharge: 35 minutes  SIGNED:   Rodena Goldmann, DO [facility] 03/29/2021, 11:13 AM  If 7PM-7AM, please contact night-coverage [email or web address]

## 2021-03-29 NOTE — Plan of Care (Signed)

## 2021-03-29 NOTE — Evaluation (Signed)
**Note De-Identified Kerry Obfuscation** Occupational Therapy Evaluation Patient Details Name: [name] MRN: [id number] DOB: [birth year] Today's Date: 03/29/2021   History of Present Illness PHUONG HILLARY is a 66 y.o. female with medical history significant for COPD, hypertension, CAD, hyperlipidemia, GERD who presents to the emergency department due to 1 month of onset of mid back pain which started after helping to lift her up her sister-in-law from the floor after sustaining a fall, about 2 weeks ago, the pain worsened without sustaining any new injury and she also complained of left shoulder pain.  She followed up with, left shoulder x-ray was done and showed a tear of the shoulder muscle and was scheduled to see an orthopedic surgeon here in [place] next week.  She also complained of 2-week onset of right rib cage pain, she presented to [facility] 2 days ago, CT of the thorax was done and showed T5 compression fracture, she was referred to a neurosurgeon in [place], however, patient states that she cannot drive that far for treatment.  Patient states that she ran out of her chronic pain medication (oxycodone 15 mg) 2 days ago and she would not be able to refill the prescription until tomorrow.  Chief complaint of cough which is associated with some chest pain and also complaining of back pain.  She denies nausea, vomiting, abdominal pain.   Clinical Impression   Pt agreeable to co-evaluation with PT. Pt appears to be near baseline levels for ADL's and functional mobility. Pt noted to desaturate to ~86% SpO2 after ambulation in hall but returned to normal levels once seated. Pt is generally weak I her upper extremities but this is likely present at baseline. Pt is not recommended for further acute OT services and will be discharged to care of nursing staff for remaining length of stay.      Recommendations for follow up therapy are one component of a multi-disciplinary discharge planning process, led by the attending  physician.  Recommendations may be updated based on patient status, additional functional criteria and insurance authorization.   Follow Up Recommendations  No OT follow up    Assistance Recommended at Discharge PRN  Patient can return home with the following Assistance with cooking/housework    Functional Status Assessment  Patient has not had a recent decline in their functional status  Equipment Recommendations  None recommended by OT           Precautions / Restrictions Precautions Precautions: Fall Restrictions Weight Bearing Restrictions: No      Mobility Bed Mobility Overal bed mobility: Independent                  Transfers Overall transfer level: Modified independent Equipment used: Quad cane                      Balance Overall balance assessment: Mild deficits observed, not formally tested                                         ADL either performed or assessed with clinical judgement   ADL Overall ADL's : Modified independent;Independent                     Lower Body Dressing: Independent;Sitting/lateral leans   Toilet Transfer: Modified Independent;Ambulation Toilet Transfer Details (indicate cue type and reason): simulated Kerry ambulatory transfer from chair to hall and  back.           General ADL Comments: Pt able to doff and don R sock seated in chair without trouble. Per pt's mobility this date, pt likely at level of mod I to independent for ADL's.     Vision Baseline Vision/History: 4 Cataracts;1 Wears glasses Ability to See in Adequate Light: 1 Impaired Patient Visual Report: No change from baseline Vision Assessment?: No apparent visual deficits (Other than baseline issues.)                Pertinent Vitals/Pain Pain Assessment Pain Assessment: 0-10 Pain Score: 9  Pain Location: L flank pain to mid back all the way to R flank. Pain Descriptors / Indicators: Sharp Pain Intervention(s):  Limited activity within patient's tolerance, Monitored during session, Repositioned     Hand Dominance Right   Extremity/Trunk Assessment Upper Extremity Assessment Upper Extremity Assessment: Generalized weakness   Lower Extremity Assessment Lower Extremity Assessment: Defer to PT evaluation   Cervical / Trunk Assessment Cervical / Trunk Assessment: Normal   Communication Communication Communication: No difficulties   Cognition Arousal/Alertness: Awake/alert Behavior During Therapy: WFL for tasks assessed/performed Overall Cognitive Status: Within Functional Limits for tasks assessed                                                        Home Living Family/patient expects to be discharged to:: Private residence Living Arrangements: Children Available Help at Discharge: Family;Available 24 hours/day Type of Home: Mobile home Home Access: Stairs to enter Entrance Stairs-Number of Steps: 1 Entrance Stairs-Rails: None Home Layout: One level     Bathroom Shower/Tub: Teacher, early years/pre: Standard     Home Equipment: Cane - quad;Shower seat          Prior Functioning/Environment Prior Level of Function : Needs assist       Physical Assist : Mobility (physical);ADLs (physical) Mobility (physical): Gait ADLs (physical): IADLs Mobility Comments: Pt has recently started using 4 point cane for ambulation. ADLs Comments: Pt reports independence with ADL's and assist from family for IADL's like cooking and grocery shopping.                      OT Goals(Current goals can be found in the care plan section) Acute Rehab OT Goals Patient Stated Goal: return home  OT Frequency:      Co-evaluation PT/OT/SLP Co-Evaluation/Treatment: Yes Reason for Co-Treatment: To address functional/ADL transfers   OT goals addressed during session: ADL's and self-care      AM-PAC OT "6 Clicks" Daily Activity     Outcome Measure Help from  another person eating meals?: None Help from another person taking care of personal grooming?: None Help from another person toileting, which includes using toliet, bedpan, or urinal?: None Help from another person bathing (including washing, rinsing, drying)?: None Help from another person to put on and taking off regular upper body clothing?: None Help from another person to put on and taking off regular lower body clothing?: None 6 Click Score: 24   End of Session Equipment Utilized During Treatment: Other (comment);Oxygen (quad cane)  Activity Tolerance: Patient tolerated treatment well Patient left: in chair;with call bell/phone within reach  OT Visit Diagnosis: Unsteadiness on feet (R26.81);Other abnormalities of gait and mobility (R26.89);Muscle weakness (generalized) (M62.81)  Time: 5945-8592 OT Time Calculation (min): 19 min Charges:  OT General Charges $OT Visit: 1 Visit OT Evaluation $OT Eval Low Complexity: 1 Low  Allyiah Gartner OT, MOT  Larey Seat 03/29/2021, 9:51 AM

## 2021-03-29 NOTE — Evaluation (Signed)
Physical Therapy Evaluation Patient Details Name: [name] MRN: [id number] DOB: 1956-01-17 Today's Date: 03/29/2021  History of Present Illness  [name] is a 66 y.o. female with medical history significant for COPD, hypertension, CAD, hyperlipidemia, GERD who presents to the emergency department due to 1 month of onset of mid back pain which started after helping to lift her up her sister-in-law from the floor after sustaining a fall, about 2 weeks ago, the pain worsened without sustaining any new injury and she also complained of left shoulder pain.  She followed up with, left shoulder x-ray was done and showed a tear of the shoulder muscle and was scheduled to see an orthopedic surgeon here in [place] next week.  She also complained of 2-week onset of right rib cage pain, she presented to [facility] 2 days ago, CT of the thorax was done and showed T5 compression fracture, she was referred to a neurosurgeon in [place], however, patient states that she cannot drive that far for treatment.  Patient states that she ran out of her chronic pain medication (oxycodone 15 mg) 2 days ago and she would not be able to refill the prescription until tomorrow.  Chief complaint of cough which is associated with some chest pain and also complaining of back pain.  She denies nausea, vomiting, abdominal pain.   Clinical Impression  Patient functioning near baseline for functional mobility and gait other than c/o of right rib and upper back pain with good return demonstrated for bed mobility, transfers and ambulating in room/hallways without loss of balance.  Plan:  patient to be discharged from hospital today and discharged from physical therapy to care of nursing for ambulation daily as tolerated for length of stay.         Recommendations for follow up therapy are one component of a multi-disciplinary discharge planning process, led by the attending physician.  Recommendations may be updated  based on patient status, additional functional criteria and insurance authorization.  Follow Up Recommendations No PT follow up    Assistance Recommended at Discharge PRN  Patient can return home with the following  Other (comment) (near baseline)    Equipment Recommendations None recommended by PT  Recommendations for Other Services       Functional Status Assessment Patient has not had a recent decline in their functional status     Precautions / Restrictions Precautions Precautions: None Restrictions Weight Bearing Restrictions: No      Mobility  Bed Mobility Overal bed mobility: Independent                  Transfers Overall transfer level: Modified independent Equipment used: Quad cane                    Ambulation/Gait Ambulation/Gait assistance: Modified independent (Device/Increase time) Gait Distance (Feet): 100 Feet Assistive device: Quad cane Gait Pattern/deviations: Step-through pattern, WFL(Within Functional Limits) Gait velocity: slightly decreased     General Gait Details: grossly WFL with good return for ambulating in room and hallways using quad-cane without loss of balance  Stairs            Wheelchair Mobility    Modified Rankin (Stroke Patients Only)       Balance Overall balance assessment: Needs assistance Sitting-balance support: Feet supported, No upper extremity supported Sitting balance-Leahy Scale: Good Sitting balance - Comments: seated at EOB   Standing balance support: During functional activity, Single extremity supported Standing balance-Leahy Scale: Good Standing balance  comment: using quad-cane                             Pertinent Vitals/Pain Pain Assessment Pain Assessment: Faces Faces Pain Scale: Hurts little more Pain Location: L flank pain to mid back all the way to R flank. Pain Descriptors / Indicators: Sharp Pain Intervention(s): Limited activity within patient's tolerance,  Monitored during session, Repositioned, Premedicated before session    Home Living Family/patient expects to be discharged to:: Private residence Living Arrangements: Children Available Help at Discharge: Family;Available 24 hours/day Type of Home: Mobile home Home Access: Stairs to enter Entrance Stairs-Rails: None Entrance Stairs-Number of Steps: 1   Home Layout: One level Home Equipment: Cane - quad;Shower seat      Prior Function Prior Level of Function : Needs assist       Physical Assist : Mobility (physical);ADLs (physical) Mobility (physical): Bed mobility;Transfers;Gait;Stairs ADLs (physical): IADLs Mobility Comments: Household and short distanced community ambulator using quad-cane ADLs Comments: Pt reports independence with ADL's and assist from family for IADL's like cooking and grocery shopping.     Hand Dominance   Dominant Hand: Right    Extremity/Trunk Assessment   Upper Extremity Assessment Upper Extremity Assessment: Defer to OT evaluation    Lower Extremity Assessment Lower Extremity Assessment: Overall WFL for tasks assessed    Cervical / Trunk Assessment Cervical / Trunk Assessment: Normal  Communication   Communication: No difficulties  Cognition Arousal/Alertness: Awake/alert Behavior During Therapy: WFL for tasks assessed/performed Overall Cognitive Status: Within Functional Limits for tasks assessed                                          General Comments      Exercises     Assessment/Plan    PT Assessment Patient does not need any further PT services  PT Problem List         PT Treatment Interventions      PT Goals (Current goals can be found in the Care Plan section)  Acute Rehab PT Goals Patient Stated Goal: return home with family to assist PT Goal Formulation: With patient Time For Goal Achievement: 03/29/21 Potential to Achieve Goals: Good    Frequency       Co-evaluation PT/OT/SLP  Co-Evaluation/Treatment: Yes Reason for Co-Treatment: To address functional/ADL transfers PT goals addressed during session: Mobility/safety with mobility;Balance;Proper use of DME OT goals addressed during session: ADL's and self-care       AM-PAC PT "6 Clicks" Mobility  Outcome Measure Help needed turning from your back to your side while in a flat bed without using bedrails?: None Help needed moving from lying on your back to sitting on the side of a flat bed without using bedrails?: None Help needed moving to and from a bed to a chair (including a wheelchair)?: None Help needed standing up from a chair using your arms (e.g., wheelchair or bedside chair)?: None Help needed to walk in hospital room?: None Help needed climbing 3-5 steps with a railing? : None 6 Click Score: 24    End of Session   Activity Tolerance: Patient tolerated treatment well Patient left: in chair;with call bell/phone within reach Nurse Communication: Mobility status PT Visit Diagnosis: Unsteadiness on feet (R26.81);Other abnormalities of gait and mobility (R26.89);Muscle weakness (generalized) (M62.81)    Time: 8938-1017 PT Time Calculation (min) (ACUTE  ONLY): 20 min   Charges:   PT Evaluation $PT Eval Moderate Complexity: 1 Mod PT Treatments $Therapeutic Activity: 8-22 mins        11:40 AM, 03/29/21 Lonell Grandchild, MPT Physical Therapist with [facility] [phone] office [phone] mobile phone

## 2021-03-29 NOTE — Progress Notes (Signed)
Discharge paperwork and instructions provided to pt, no questions from pt. PIV's removed, guaze placed, sites clean, dry, and intact. Belongings returned, patient dressed herself. NT transferred pt to main entrance via wheelchair.

## 2021-04-03 ENCOUNTER — Encounter: Payer: Self-pay | Admitting: Acute Care

## 2021-04-03 ENCOUNTER — Telehealth: Payer: Self-pay | Admitting: Acute Care

## 2021-04-03 ENCOUNTER — Telehealth: Payer: Self-pay | Admitting: Family Medicine

## 2021-04-03 ENCOUNTER — Other Ambulatory Visit: Payer: Self-pay

## 2021-04-03 ENCOUNTER — Ambulatory Visit (INDEPENDENT_AMBULATORY_CARE_PROVIDER_SITE_OTHER): Payer: Medicare Other | Admitting: Acute Care

## 2021-04-03 ENCOUNTER — Other Ambulatory Visit: Payer: Self-pay | Admitting: Family Medicine

## 2021-04-03 ENCOUNTER — Ambulatory Visit (HOSPITAL_COMMUNITY): Admission: RE | Admit: 2021-04-03 | Payer: Medicare Other | Source: Ambulatory Visit

## 2021-04-03 DIAGNOSIS — F17210 Nicotine dependence, cigarettes, uncomplicated: Secondary | ICD-10-CM | POA: Diagnosis not present

## 2021-04-03 DIAGNOSIS — S22050A Wedge compression fracture of T5-T6 vertebra, initial encounter for closed fracture: Secondary | ICD-10-CM | POA: Diagnosis not present

## 2021-04-03 DIAGNOSIS — Z87891 Personal history of nicotine dependence: Secondary | ICD-10-CM

## 2021-04-03 MED ORDER — CYCLOBENZAPRINE HCL 5 MG PO TABS: 5.0000 mg | ORAL_TABLET | Freq: Three times a day (TID) | ORAL | 0 refills | Status: DC | PRN

## 2021-04-03 NOTE — Telephone Encounter (Signed)
Please let the patient know that I sent their prescription to their pharmacy. Thanks, WS 

## 2021-04-03 NOTE — Progress Notes (Addendum)
Shared Decision Making Visit Lung Cancer Screening Program [phone])   Eligibility: Age 66 y.o. Pack Years Smoking History Calculation 62 pack year smoking history (# packs/per year x # years smoked) Recent History of coughing up blood  no Unexplained weight loss? no ( >Than 15 pounds within the last 6 months ) Prior History Lung / other cancer no (Diagnosis within the last 5 years already requiring surveillance chest CT Scans). Smoking Status Current Smoker Former Smokers: Years since quit: NA  Quit Date: NA  Visit Components: Discussion included one or more decision making aids. yes Discussion included risk/benefits of screening. yes Discussion included potential follow up diagnostic testing for abnormal scans. yes Discussion included meaning and risk of over diagnosis. yes Discussion included meaning and risk of False Positives. yes Discussion included meaning of total radiation exposure. yes  Counseling Included: Importance of adherence to annual lung cancer LDCT screening. yes Impact of comorbidities on ability to participate in the program. yes Ability and willingness to under diagnostic treatment. yes  Smoking Cessation Counseling: Current Smokers:  Discussed importance of smoking cessation. yes Information about tobacco cessation classes and interventions provided to patient. yes Patient provided with "ticket" for LDCT Scan. yes Symptomatic Patient. no  Counseling NA Diagnosis Code: Tobacco Use Z72.0 Asymptomatic Patient yes  Counseling (Intermediate counseling: > three minutes counseling) [id number] Former Smokers:  Discussed the importance of maintaining cigarette abstinence. yes Diagnosis Code: Personal History of Nicotine Dependence. R67.893 Information about tobacco cessation classes and interventions provided to patient. Yes Patient provided with "ticket" for LDCT Scan. yes Written Order for Lung Cancer Screening with LDCT placed in Epic. Yes (CT Chest Lung Cancer  Screening Low Dose W/O CM) [id number] Z12.2-Screening of respiratory organs Z87.891-Personal history of nicotine dependence  I have spent 25 minutes of face to face/ virtual visit   time with  [name] discussing the risks and benefits of lung cancer screening. We viewed / discussed a power point together that explained in detail the above noted topics. We paused at intervals to allow for questions to be asked and answered to ensure understanding.We discussed that the single most powerful action that she can take to decrease her risk of developing lung cancer is to quit smoking. We discussed whether or not she is ready to commit to setting a quit date. We discussed options for tools to aid in quitting smoking including nicotine replacement therapy, non-nicotine medications, support groups, Quit Smart classes, and behavior modification. We discussed that often times setting smaller, more achievable goals, such as eliminating 1 cigarette a day for a week and then 2 cigarettes a day for a week can be helpful in slowly decreasing the number of cigarettes smoked. This allows for a sense of accomplishment as well as providing a clinical benefit. I provided  her  with smoking cessation  information  with contact information for community resources, classes, free nicotine replacement therapy, and access to mobile apps, text messaging, and on-line smoking cessation help. I have also provided  her  the office contact information in the event she needs to contact me, or the screening staff. We discussed the time and location of the scan, and that either Doroteo Glassman RN, Joella Prince, RN  or I will call / send a letter with the results within 24-72 hours of receiving them. The patient verbalized understanding of all of  the above and had no further questions upon leaving the office. They have my contact information in the event they have any further questions.  I spent 3-4  minutes counseling on smoking cessation and  the health risks of continued tobacco abuse.  I explained to the patient that there has been a high incidence of coronary artery disease noted on these exams. I explained that this is a non-gated exam therefore degree or severity cannot be determined. This patient is not on statin therapy. I have asked the patient to follow-up with their PCP regarding any incidental finding of coronary artery disease and management with diet or medication as their PCP  feels is clinically indicated. The patient verbalized understanding of the above and had no further questions upon completion of the visit.  This patient was in the hospital with a fall, and she had a CTA done on that admission on 03/28/2021. The scan showed 5 by 4 mm ground-glass density right upper lobe pulmonary nodule             on image 61 series 6, previously 0.4 cm on 03/22/2020. Subjectively this appears mildly larger, and was even less apparent on chest CT from 03/06/2019. Given the patient's smoking history, surveillance by chest CT in 12 months time is recommended  We will schedule her next scan for 03/2022, and we will cancel the scan ordered for today.    Magdalen Spatz, NP 04/03/2021

## 2021-04-03 NOTE — Patient Instructions (Signed)
Thank you for participating in the [facility] Screening Program. °It was our pleasure to meet you today. °We will call you with the results of your scan within the next few days. °Your scan will be assigned a Lung RADS category score by the physicians reading the scans.  °This Lung RADS score determines follow up scanning.  °See below for description of categories, and follow up screening recommendations. °We will be in touch to schedule your follow up screening annually or based on recommendations of our providers. °We will fax a copy of your scan results to your Primary Care Physician, or the physician who referred you to the program, to ensure they have the results. °Please call the office if you have any questions or concerns regarding your scanning experience or results.  °Our office number is [phone]. °Please speak with Denise Phelps, RN. She is our Lung Cancer Screening RN. °If she is unavailable when you call, please have the office staff send her a message. She will return your call at her earliest convenience. °Remember, if your scan is normal, we will scan you annually as long as you continue to meet the criteria for the program. (Age 55-77, Current smoker or smoker who has quit within the last 15 years). °If you are a smoker, remember, quitting is the single most powerful action that you can take to decrease your risk of lung cancer and other pulmonary, breathing related problems. °We know quitting is hard, and we are here to help.  °Please let us know if there is anything we can do to help you meet your goal of quitting. °If you are a former smoker, congratulations. We are proud of you! Remain smoke free! °Remember you can refer friends or family members through the number above.  °We will screen them to make sure they meet criteria for the program. °Thank you for helping us take better care of you by participating in Lung Screening. ° °You can receive free nicotine replacement therapy  ( patches, gum or mints) by calling 1-800-QUIT NOW. Please call so we can get you on the path to becoming  a non-smoker. I know it is hard, but you can do this! ° °Lung RADS Categories: ° °Lung RADS 1: no nodules or definitely non-concerning nodules.  °Recommendation is for a repeat annual scan in 12 months. ° °Lung RADS 2:  nodules that are non-concerning in appearance and behavior with a very low likelihood of becoming an active cancer. °Recommendation is for a repeat annual scan in 12 months. ° °Lung RADS 3: nodules that are probably non-concerning , includes nodules with a low likelihood of becoming an active cancer.  Recommendation is for a 6-month repeat screening scan. Often noted after an upper respiratory illness. We will be in touch to make sure you have no questions, and to schedule your 6-month scan. ° °Lung RADS 4 A: nodules with concerning findings, recommendation is most often for a follow up scan in 3 months or additional testing based on our provider's assessment of the scan. We will be in touch to make sure you have no questions and to schedule the recommended 3 month follow up scan. ° °Lung RADS 4 B:  indicates findings that are concerning. We will be in touch with you to schedule additional diagnostic testing based on our provider's  assessment of the scan. ° °Hypnosis for smoking cessation  °[employer] °[phone] ° °Acupuncture for smoking cessation  °[facility] °[phone]  °

## 2021-04-03 NOTE — Addendum Note (Signed)
Addended by: Joella Prince on: 04/03/2021 02:02 PM   Modules accepted: Orders

## 2021-04-03 NOTE — Telephone Encounter (Signed)
Patient is requesting a refill on flexeril. She has a hospital follow with you on 02/06

## 2021-04-03 NOTE — Telephone Encounter (Cosign Needed)
I have called the patient  to do her shared decision making visit. She had been in the hospital in the ED 03/28/2021 and had a CTA done at that time. We reviewed the results, and I explained that she does have some small nodules that the radiologist feels are stable, and that we will cancel her LDCT scheduled for today, and do an annual scan as recommended by radiology ( 03/28/2021 CTA) 03/2022. The scan scheduled for today has been canceled. We did complete the shared decision making visit as scheduled.

## 2021-04-04 ENCOUNTER — Ambulatory Visit: Payer: Medicare Other | Admitting: Nurse Practitioner

## 2021-04-08 ENCOUNTER — Telehealth: Payer: Self-pay

## 2021-04-08 ENCOUNTER — Telehealth (HOSPITAL_COMMUNITY): Payer: Self-pay

## 2021-04-08 ENCOUNTER — Ambulatory Visit (INDEPENDENT_AMBULATORY_CARE_PROVIDER_SITE_OTHER): Payer: Medicare Other | Admitting: Family Medicine

## 2021-04-08 ENCOUNTER — Encounter: Payer: Self-pay | Admitting: Family Medicine

## 2021-04-08 ENCOUNTER — Other Ambulatory Visit (HOSPITAL_COMMUNITY): Payer: Self-pay | Admitting: Interventional Radiology

## 2021-04-08 ENCOUNTER — Ambulatory Visit (INDEPENDENT_AMBULATORY_CARE_PROVIDER_SITE_OTHER): Payer: Medicare Other

## 2021-04-08 VITALS — Wt 158.0 lb

## 2021-04-08 VITALS — BP 108/71 | HR 100 | Temp 97.7°F | Ht 65.0 in | Wt 157.0 lb

## 2021-04-08 DIAGNOSIS — J441 Chronic obstructive pulmonary disease with (acute) exacerbation: Secondary | ICD-10-CM

## 2021-04-08 DIAGNOSIS — Z1231 Encounter for screening mammogram for malignant neoplasm of breast: Secondary | ICD-10-CM

## 2021-04-08 DIAGNOSIS — Z0000 Encounter for general adult medical examination without abnormal findings: Secondary | ICD-10-CM | POA: Diagnosis not present

## 2021-04-08 DIAGNOSIS — Z780 Asymptomatic menopausal state: Secondary | ICD-10-CM

## 2021-04-08 DIAGNOSIS — N3090 Cystitis, unspecified without hematuria: Secondary | ICD-10-CM

## 2021-04-08 DIAGNOSIS — Z599 Problem related to housing and economic circumstances, unspecified: Secondary | ICD-10-CM | POA: Diagnosis not present

## 2021-04-08 LAB — URINALYSIS, COMPLETE
Bilirubin, UA: NEGATIVE
Glucose, UA: NEGATIVE
Nitrite, UA: NEGATIVE
RBC, UA: NEGATIVE
Specific Gravity, UA: >1.03 — ABNORMAL HIGH (ref 1.005–1.030)
Urobilinogen, Ur: 1 mg/dL (ref 0.2–1.0)
pH, UA: 5.5 (ref 5.0–7.5)

## 2021-04-08 LAB — MICROSCOPIC EXAMINATION
RBC, Urine: NONE SEEN /hpf (ref 0–2)
Renal Epithel, UA: NONE SEEN /hpf

## 2021-04-08 MED ORDER — PREDNISONE 10 MG PO TABS: ORAL_TABLET | ORAL | 0 refills | Status: DC

## 2021-04-08 NOTE — Telephone Encounter (Signed)
° °  Telephone encounter was:  Successful.  04/08/2021 Name: [name] MRN: [id number] DOB: Jun 11, 1955  [name] is a 66 y.o. year old female who is a primary care patient of Stacks, Cletus Gash, MD . The community resource team was consulted for assistance with Financial Difficulties related to un able to pay bills  Care guide performed the following interventions: Patient provided with information about care guide support team and interviewed to confirm resource needs.Patient stated she cant pay utilities and needs financial assistance  Follow Up Plan:  Care guide will follow up with patient by phone over the next Tw[place], Care Management  [phone] [address], [place], [place] [address] Phone: [phone] Email: [email or web address]

## 2021-04-08 NOTE — Progress Notes (Signed)
Subjective:   [name] is a 66 y.o. female who presents for Medicare Annual (Subsequent) preventive examination.  Virtual Visit via Telephone Note  I connected with  MAECI KALBFLEISCH on 04/08/21 at  1:15 PM EST by telephone and verified that I am speaking with the correct person using two identifiers.  Location: Patient: Home Provider: [facility] Persons participating in the virtual visit: patient/Nurse Health Advisor   I discussed the limitations, risks, security and privacy concerns of performing an evaluation and management service by telephone and the availability of in person appointments. The patient expressed understanding and agreed to proceed.  Interactive audio and video telecommunications were attempted between this nurse and patient, however failed, due to patient having technical difficulties OR patient did not have access to video capability.  We continued and completed visit with audio only.  Some vital signs may be absent or patient reported.   Saintclair Schroader E Adelee Hannula, LPN   Review of Systems     Cardiac Risk Factors include: advanced age (>90men, >55 women);smoking/ tobacco exposure;sedentary lifestyle     Objective:    Today's Vitals   04/08/21 1321  Weight: 158 lb (71.7 kg)  PainSc: 8    Body mass index is 26.29 kg/m.  Advanced Directives 04/08/2021 03/29/2021 03/28/2021 03/15/2021 03/01/2021 02/05/2021 10/04/2020  Does Patient Have a Medical Advance Directive? No - No No No No No  Would patient like information on creating a medical advance directive? No - Patient declined No - Patient declined - No - Patient declined No - Patient declined No - Patient declined No - Patient declined  Pre-existing out of facility DNR order (yellow form or pink MOST form) - - - - - - -  Some encounter information is confidential and restricted. Go to Review Flowsheets activity to see all data.    Current Medications (verified) Outpatient Encounter Medications as of 04/08/2021  Medication  Sig   albuterol (VENTOLIN HFA) 108 (90 Base) MCG/ACT inhaler Inhale 2 puffs into the lungs every 6 (six) hours as needed for wheezing or shortness of breath.   amLODipine (NORVASC) 10 MG tablet TAKE ONE TABLET BY MOUTH DAILY   Ascorbic Acid (VITAMIN C) 1000 MG tablet Take 1,000 mg by mouth daily.   aspirin EC 81 MG tablet Take 81 mg by mouth daily.   atorvastatin (LIPITOR) 40 MG tablet Take 1 tablet (40 mg total) by mouth at bedtime. (NEEDS TO BE SEEN BEFORE NEXT REFILL)   budesonide-formoterol (SYMBICORT) 160-4.5 MCG/ACT inhaler INHALE TWO PUFFS INTO THE LUNGS TWICE DAILY   buPROPion (WELLBUTRIN XL) 300 MG 24 hr tablet Take 1 tablet (300 mg total) by mouth daily.   clonazePAM (KLONOPIN) 1 MG tablet Take 1 tablet (1 mg total) by mouth 3 (three) times daily as needed. for anxiety   cyclobenzaprine (FLEXERIL) 5 MG tablet Take 1 tablet (5 mg total) by mouth 3 (three) times daily as needed.   dexlansoprazole (DEXILANT) 60 MG capsule TAKE ONE CAPSULE BY MOUTH DAILY ON AN EMPTY STOMACH, DO NOT EAT FOR ONE HOUR (NEEDS TO BE SEEN BEFORE NEXT REFILL)   DULoxetine (CYMBALTA) 60 MG capsule Take 1 capsule (60 mg total) by mouth 2 (two) times daily.   fexofenadine (ALLEGRA) 180 MG tablet TAKE ONE TABLET BY MOUTH DAILY FOR ALLERGY SYMPTOMS (Patient taking differently: Take 180 mg by mouth daily.)   gabapentin (NEURONTIN) 300 MG capsule Take 300 mg by mouth 3 (three) times daily.   ipratropium-albuterol (DUONEB) 0.5-2.5 (3) MG/3ML SOLN Take 3 mLs  by nebulization every 6 (six) hours as needed.   lisinopril (ZESTRIL) 20 MG tablet Take 1 tablet (20 mg total) by mouth daily. (NEEDS TO BE SEEN BEFORE NEXT REFILL)   oxyCODONE (ROXICODONE) 15 MG immediate release tablet Take 15 mg by mouth 4 (four) times daily as needed.   SPIRIVA RESPIMAT 2.5 MCG/ACT AERS INHALE TWO PUFFS INTO THE LUNGS DAILY (Patient taking differently: Inhale 2 puffs into the lungs daily.)   Vitamin D, Ergocalciferol, (DRISDOL) 1.25 MG (50000 UNIT)  CAPS capsule Take 1 capsule (50,000 Units total) by mouth every 7 (seven) days. (Patient taking differently: Take 50,000 Units by mouth every 7 (seven) days. Sunday)   cholecalciferol (VITAMIN D3) 25 MCG (1000 UNIT) tablet Take 1,000 Units by mouth daily. (Patient not taking: Reported on 04/08/2021)   ILEVRO 0.3 % ophthalmic suspension Place 1 drop into the left eye daily. (Patient not taking: Reported on 04/08/2021)   lidocaine (LIDODERM) 5 % Place 1 patch onto the skin daily. Remove & Discard patch within 12 hours or as directed by MD (Patient not taking: Reported on 04/08/2021)   meloxicam (MOBIC) 15 MG tablet Take 15 mg by mouth daily. (Patient not taking: Reported on 04/08/2021)   moxifloxacin (VIGAMOX) 0.5 % ophthalmic solution Apply 1 drop to eye 3 (three) times daily. (Patient not taking: Reported on 04/08/2021)   prednisoLONE acetate (PRED FORTE) 1 % ophthalmic suspension 1 drop 3 (three) times daily. (Patient not taking: Reported on 04/08/2021)   [DISCONTINUED] cloNIDine (CATAPRES) 0.1 MG tablet Take 1 tablet (0.1 mg total) by mouth at bedtime. (NEEDS TO BE SEEN BEFORE NEXT REFILL) (Patient not taking: Reported on 03/28/2021)   [DISCONTINUED] magic mouthwash (nystatin, lidocaine, diphenhydrAMINE, alum & mag hydroxide) suspension Swish and spit 5 mLs 4 (four) times daily as needed for mouth pain. (Patient not taking: Reported on 03/28/2021)   No facility-administered encounter medications on file as of 04/08/2021.    Allergies (verified) Lorcet 10-650 [hydrocodone-acetaminophen], Sulfa antibiotics, and Tape   History: Past Medical History:  Diagnosis Date   Allergy    Arthritis    Atypical mole 06/05/2003   slight-mod-Left scapula (WS)   Atypical nevus 07/26/2003   persist. dysp- Left scapula (WS)   Basal cell carcinoma 06/05/2003   RIght chest (CX35FU)   Basal cell carcinoma 05/22/2004   superificial- RIght upper back- (CX35FU)   Basal cell carcinoma 11/27/2004   beside R nostril-(MOHS),  below right outer nose (MOHS)   Basal cell carcinoma 11/08/2009   left post shoulder -(txpbx)   Basal cell carcinoma 05/24/2012   ulcerated- Left post shoulder- (CX35FU), ulcerated-mid forehead (EXC )   Basal cell carcinoma 06/24/2012   superificial- Right shoulder - (txpbx)    Basal cell carcinoma 07/29/2012    sclerosis- mid forehead (MOHS)   Basal cell carcinoma 06/06/2019   nod-right anterior neck (CX35FU)   Cataract    Constipation    uses OTC meds with help   COPD (chronic obstructive pulmonary disease) (HCC)    Coronary artery disease    minimal nonobstructive    COVID    Depression    GERD (gastroesophageal reflux disease)    Heart murmur    Hx of adenomatous polyp of colon 10/03/2014   Hypercholesteremia    Hypertension    Panic attacks    Perimenopausal    Skin cancer    Squamous cell carcinoma of skin 06/05/2003   Right [place](CX35FU)   Squamous cell carcinoma of skin 05/22/2004   in situ- Left upperarm (CX35FU)  Squamous cell carcinoma of skin 11/27/2004   in situ- above Left elbow (CX35FU)   Squamous cell carcinoma of skin 03/04/2006   in situ- Left sideburn (CX35FU)   Squamous cell carcinoma of skin 11/26/2006   in situ- mid brow (Cx35FU)   Squamous cell carcinoma of skin 05/24/2012   in situ- Left chest- (CX35FU)   Squamous cell carcinoma of skin 04/05/2013   in situ- Left nose (Cx35FU)   Squamous cell carcinoma of skin 08/17/2013   well diff-above Left eye (txpbx)   Squamous cell carcinoma of skin 07/30/2016   in situ- right chest sup (Cx35FU)   Squamous cell carcinoma of skin 05/12/2017   in situ- RIght shoulder (CX35FU), in situ- Left forehead (CX35FU)   Squamous cell carcinoma of skin 11/23/2018   in situ- Right mid shin, inner- (MOHS)   Tobacco user    Past Surgical History:  Procedure Laterality Date   ABDOMINAL HYSTERECTOMY     CATARACT EXTRACTION W/PHACO Right 03/01/2021   Procedure: CATARACT EXTRACTION PHACO AND INTRAOCULAR LENS PLACEMENT  (IOC);  Surgeon: Baruch Goldmann, MD;  Location: [facility];  Service: Ophthalmology;  Laterality: Right;  CDE: 7.31   COLONOSCOPY     Right foot toe surgery     UPPER GASTROINTESTINAL ENDOSCOPY     vocal cord     polyp removal   Family History  Problem Relation Age of Onset   Heart disease Mother    Heart attack Mother    Heart attack Father    Heart attack Brother    Colon cancer Brother 10       died at age 67   Anxiety disorder Brother    Depression Brother    Alcohol abuse Brother    Colon polyps Brother    Colon polyps Brother    Colon polyps Sister    Esophageal cancer Sister    Anxiety disorder Sister    Depression Sister    Colon polyps Sister    Drug abuse Son    Rectal cancer Neg Hx    Stomach cancer Neg Hx    Social History   Socioeconomic History   Marital status: Single    Spouse name: Not on file   Number of children: 2   Years of education: Not on file   Highest education level: Not on file  Occupational History   Occupation: Scientist, 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   Sexual activity: Not on file  Other Topics Concern   Not on file  Social History Narrative   Retired/ lives alone in one level home   Son and daughter in law that live next door and help her out a lot         Social Determinants of Health   Financial Resource Strain: Medium Risk   Difficulty of Paying Living Expenses: Somewhat hard  Food Insecurity: No Food Insecurity   Worried About Charity fundraiser in  the Last Year: Never true   Ran Out of Food in  the Last Year: Never true  Transportation Needs: No Transportation Needs   Lack of Transportation (Medical): No   Lack of Transportation (Non-Medical): No  Physical Activity: Inactive   Days of Exercise per Week: 0 days   Minutes of Exercise per Session: 0 min  Stress: Stress Concern Present   Feeling of Stress : To some extent  Social Connections: Socially Isolated   Frequency of Communication with Friends and Family: More than three times a week   Frequency of Social Gatherings with Friends and Family: More than three times a week   Attends Religious Services: Never   Marine scientist or Organizations: No   Attends Music therapist: Never   Marital Status: Divorced    Tobacco Counseling Ready to quit: Not Answered Counseling given: Not Answered   Clinical Intake:  Pre-visit preparation completed: Yes  Pain : 0-10 Pain Score: 8  Pain Type: Acute pain Pain Location: Back Pain Orientation: Mid Pain Descriptors / Indicators: Sharp, Sore, Tender, Constant Pain Onset: 1 to 4 weeks ago Pain Frequency: Constant Effect of Pain on Daily Activities: hard to catch breath, using cane, difficult to get around     BMI - recorded: 26.29 Nutritional Status: BMI 25 -29 Overweight Nutritional Risks: None Diabetes: No  How often do you need to have someone help you when you read instructions, pamphlets, or other written materials from your doctor or pharmacy?: 1 - Never  Diabetic? no  Interpreter Needed?: No  Information entered by :: Welford Christmas, LPN   Activities of Daily Living In your present state of health, do you have any difficulty performing the following activities: 04/08/2021 03/29/2021  Hearing? N N  Vision? N N  Difficulty concentrating or making decisions? Y N  Walking or climbing stairs? Y N  Dressing or bathing? N N  Doing errands, shopping? N N  Preparing Food and eating ? N -  Using the Toilet? N -  In the past six months, have you  accidently leaked urine? N -  Do you have problems with loss of bowel control? N -  Managing your Medications? N -  Managing your Finances? N -  Housekeeping or managing your Housekeeping? N -  Some recent data might be hidden    Patient Care Team: Claretta Fraise, MD as PCP - General (Family Medicine) Harl Bowie, Alphonse Guild, MD as PCP - Cardiology (Cardiology) Gatha Mayer, MD as Consulting Physician (Gastroenterology) Cloria Spring, MD as Consulting Physician ([place]) [name], MD as Consulting Physician (Dermatology) Marshell Garfinkel, MD as Consulting Physician (Pulmonary Disease) Mordecai Rasmussen, MD as Consulting Physician (Orthopedic Surgery) Boyd Kerbs, MD as Referring Physician (Neurosurgery) Baruch Goldmann, MD as Consulting Physician (Ophthalmology) Leticia Clas, OD (Optometry)  Indicate any recent Medical Services you may have received from other than [facility] providers in the past year (date may be approximate).     Assessment:   This is a routine wellness examination for  [facility].  Hearing/Vision screen Hearing Screening - Comments:: Denies hearing difficulties   Vision Screening - Comments:: Wears rx glasses - up to date with routine eye exams with [facility]ana Hoes in [place] - has had cataract surgery on right eye - waiting to get better to schedule for left eye  Dietary issues and exercise activities discussed: Current Exercise Habits: The patient does not participate in regular exercise at present, Exercise limited by: orthopedic condition(s);neurologic condition(s);respiratory conditions(s);cardiac condition(s)  Goals Addressed             This Visit's Progress    AWV   Not on track    04/02/2020 AWV Goal: Exercise for General Health  Patient will verbalize understanding of the benefits of increased physical activity: Exercising regularly is important. It will improve your overall fitness, flexibility, and endurance. Regular exercise also will improve  your overall health. It can help you control your weight, reduce stress, and improve your bone density. Over the next year, patient will increase physical activity as tolerated with a goal of at least 150 minutes of moderate physical activity per week.  You can tell that you are exercising at a moderate intensity if your heart starts beating faster and you start breathing faster but can still hold a conversation. Moderate-intensity exercise ideas include: Walking 1 mile (1.6 km) in about 15 minutes Biking Hiking Golfing Dancing Water aerobics Patient will verbalize understanding of everyday activities that increase physical activity by providing examples like the following: Yard work, such as: Sales promotion account executive Gardening Washing windows or floors Patient will be able to explain general safety guidelines for exercising:  Before you start a new exercise program, talk with your health care provider. Do not exercise so much that you hurt yourself, feel dizzy, or get very short of breath. Wear comfortable clothes and wear shoes with good support. Drink plenty of water while you exercise to prevent dehydration or heat stroke. Work out until your breathing and your heartbeat get faster.        Depression Screen PHQ 2/9 Scores 04/08/2021 08/28/2020 08/28/2020 04/02/2020 02/28/2020 08/22/2019 07/15/2018  PHQ - 2 Score 2 5 0 4 4 1  0  PHQ- 9 Score 5 14 - 10 10 - -  Some encounter information is confidential and restricted. Go to Review Flowsheets activity to see all data.    Fall Risk Fall Risk  04/08/2021 08/28/2020 04/02/2020 02/28/2020 08/22/2019  Falls in the past year? 1 0 0 0 1  Number falls in past yr: 0 - - - 1  Injury with Fall? 1 - - - 0  Risk for fall due to : History of fall(s);Impaired balance/gait;Orthopedic patient;Medication side effect - - - History of fall(s)  Follow up Education provided;Falls  prevention discussed - - - Falls evaluation completed    FALL RISK PREVENTION PERTAINING TO THE HOME:  Any stairs in or around the home? No  If so, are there any without handrails? No  Home free of loose throw rugs in walkways, pet beds, electrical cords, etc? Yes  Adequate lighting in your home to reduce risk of falls? Yes   ASSISTIVE DEVICES UTILIZED TO PREVENT FALLS:  Life alert? No  Use of a cane, walker or w/c? Yes  Grab bars in the bathroom? No  Shower chair or bench in shower? Yes  Elevated toilet seat or a handicapped toilet? No   TIMED UP AND GO:  Was the test performed? No . Telephonic visit  Cognitive Function: Normal cognitive status assessed by direct observation by this Nurse Health Advisor. No abnormalities found.       6CIT Screen 04/02/2020  What Year? 0 points  What month? 0 points  What time? 0 points  Count back from 20 0 points  Months in reverse 0 points  Repeat phrase 0 points  Total Score 0    Immunizations Immunization History  Administered Date(s) Administered  Fluad Quad(high Dose 65+) 12/10/2018, 01/30/2021   Influenza, High Dose Seasonal PF 12/05/2019   Influenza,inj,Quad PF,6+ Mos 02/11/2013, 05/17/2015, 12/03/2015, 12/26/2016, 12/16/2017   Influenza-Unspecified 12/15/2018   Moderna Sars-Covid-2 Vaccination 05/05/2019, 06/17/2019   Pneumococcal Conjugate-13 08/22/2019   Pneumococcal Polysaccharide-23 08/28/2020   Tdap 08/22/2019   Zoster Recombinat (Shingrix) 08/28/2020   Zoster, Live 06/02/2014    TDAP status: Up to date  Flu Vaccine status: Up to date  Pneumococcal vaccine status: Up to date  Covid-19 vaccine status: Completed vaccines  Qualifies for Shingles Vaccine? Yes   Zostavax completed Yes   Shingrix Completed?: No.    Education has been provided regarding the importance of this vaccine. Patient has been advised to call insurance company to determine out of pocket expense if they have not yet received this vaccine.  Advised may also receive vaccine at local pharmacy or Health Dept. Verbalized acceptance and understanding.  Screening Tests Health Maintenance  Topic Date Due   DEXA SCAN  Never done   COVID-19 Vaccine (3 - Moderna risk series) 07/15/2019   MAMMOGRAM  07/26/2020   Zoster Vaccines- Shingrix (2 of 2) 10/23/2020   COLONOSCOPY (Pts 45-93yrs Insurance coverage will need to be confirmed)  01/22/2023   TETANUS/TDAP  08/21/2029   Pneumonia Vaccine 20+ Years old  Completed   INFLUENZA VACCINE  Completed   Hepatitis C Screening  Completed   HPV VACCINES  Aged Out    Health Maintenance  Health Maintenance Due  Topic Date Due   DEXA SCAN  Never done   COVID-19 Vaccine (3 - Moderna risk series) 07/15/2019   MAMMOGRAM  07/26/2020   Zoster Vaccines- Shingrix (2 of 2) 10/23/2020    Colorectal cancer screening: Type of screening: Colonoscopy. Completed 01/21/2018. Repeat every 5 years  Mammogram status: Ordered 04/08/2021. Pt provided with contact info and advised to call to schedule appt.   Bone Density status: Ordered 04/08/2021. Pt provided with contact info and advised to call to schedule appt.  Lung Cancer Screening: (Low Dose CT Chest recommended if Age 29-80 years, 30 pack-year currently smoking OR have quit w/in 15years.) does qualify.   Lung Cancer Screening Referral: done 04/03/2021  Additional Screening:  Hepatitis C Screening: does qualify; Completed 09/01/2016  Vision Screening: Recommended annual ophthalmology exams for early detection of glaucoma and other disorders of the eye. Is the patient up to date with their annual eye exam?  Yes  Who is the provider or what is the name of the office in which the patient attends annual eye exams? [facility] in [place] If pt is not established with a provider, would they like to be referred to a provider to establish care? No .   Dental Screening: Recommended annual dental exams for proper oral hygiene  Community Resource Referral / Chronic Care  Management: CRR required this visit?  Yes   CCM required this visit?  No      Plan:     I have personally reviewed and noted the following in the patients chart:   Medical and social history Use of alcohol, tobacco or illicit drugs  Current medications and supplements including opioid prescriptions.  Functional ability and status Nutritional status Physical activity Advanced directives List of other physicians Hospitalizations, surgeries, and ER visits in previous 12 months Vitals Screenings to include cognitive, depression, and falls Referrals and appointments  In addition, I have reviewed and discussed with patient certain preventive protocols, quality metrics, and best practice recommendations. A written personalized care plan for preventive services as  well as general preventive health recommendations were provided to patient.     Sandrea Hammond, LPN   03/07/3792   Nurse Notes: referred to [facility] to assist with Utility bill - power is to be disconnected 04/15/21 if she cannot pay.

## 2021-04-08 NOTE — Telephone Encounter (Signed)
Called to inform pt that we received the referral from Dr. Annette Stable. She would like to proceed. Will call her back to schedule once Josem Kaufmann has been obtained. AW

## 2021-04-08 NOTE — Patient Instructions (Signed)
[name] , Thank you for taking time to come for your Medicare Wellness Visit. I appreciate your ongoing commitment to your health goals. Please review the following plan we discussed and let me know if I can assist you in the future.   Screening recommendations/referrals: Colonoscopy: Done 01/21/2018 - Repeat in 5 years  Mammogram: Done 08/05/2019 - Repeat annually *due - ordered today - call for appointment when you feel better Bone Density: Due - recommended every 2 years - we can do this at our office  Recommended yearly ophthalmology/optometry visit for glaucoma screening and checkup Recommended yearly dental visit for hygiene and checkup  Vaccinations: Influenza vaccine: Done 01/30/2021 - Repeat annually Pneumococcal vaccine: Done 08/22/2019 & 08/28/2020 Tdap vaccine: Done 08/22/2019 - Repeat in 10 years Shingles vaccine: Done 08/28/2020 - due for second dose at next visit   Covid-19: Done 05/05/2019, 06/17/2019 - contact pharmacy for additional boosters  Advanced directives: Advance directive discussed with you today. Even though you declined this today, please call our office should you change your mind, and we can give you the proper paperwork for you to fill out.   Conditions/risks identified: Aim for 30 minutes of exercise each day, drink 6-8 glasses of water and eat lots of fruits and vegetables.   Next appointment: Follow up in one year for your annual wellness visit    Preventive Care 65 Years and Older, Female Preventive care refers to lifestyle choices and visits with your health care provider that can promote health and wellness. What does preventive care include? A yearly physical exam. This is also called an annual well check. Dental exams once or twice a year. Routine eye exams. Ask your health care provider how often you should have your eyes checked. Personal lifestyle choices, including: Daily care of your teeth and gums. Regular physical activity. Eating a healthy  diet. Avoiding tobacco and drug use. Limiting alcohol use. Practicing safe sex. Taking low-dose aspirin every day. Taking vitamin and mineral supplements as recommended by your health care provider. What happens during an annual well check? The services and screenings done by your health care provider during your annual well check will depend on your age, overall health, lifestyle risk factors, and family history of disease. Counseling  Your health care provider may ask you questions about your: Alcohol use. Tobacco use. Drug use. Emotional well-being. Home and relationship well-being. Sexual activity. Eating habits. History of falls. Memory and ability to understand (cognition). Work and work Statistician. Reproductive health. Screening  You may have the following tests or measurements: Height, weight, and BMI. Blood pressure. Lipid and cholesterol levels. These may be checked every 5 years, or more frequently if you are over 52 years old. Skin check. Lung cancer screening. You may have this screening every year starting at age 67 if you have a 30-pack-year history of smoking and currently smoke or have quit within the past 15 years. Fecal occult blood test (FOBT) of the stool. You may have this test every year starting at age 93. Flexible sigmoidoscopy or colonoscopy. You may have a sigmoidoscopy every 5 years or a colonoscopy every 10 years starting at age 2. Hepatitis C blood test. Hepatitis B blood test. Sexually transmitted disease (STD) testing. Diabetes screening. This is done by checking your blood sugar (glucose) after you have not eaten for a while (fasting). You may have this done every 1-3 years. Bone density scan. This is done to screen for osteoporosis. You may have this done starting at age 73.  Mammogram. This may be done every 1-2 years. Talk to your health care provider about how often you should have regular mammograms. Talk with your health care provider about  your test results, treatment options, and if necessary, the need for more tests. Vaccines  Your health care provider may recommend certain vaccines, such as: Influenza vaccine. This is recommended every year. Tetanus, diphtheria, and acellular pertussis (Tdap, Td) vaccine. You may need a Td booster every 10 years. Zoster vaccine. You may need this after age 65. Pneumococcal 13-valent conjugate (PCV13) vaccine. One dose is recommended after age 29. Pneumococcal polysaccharide (PPSV23) vaccine. One dose is recommended after age 58. Talk to your health care provider about which screenings and vaccines you need and how often you need them. This information is not intended to replace advice given to you by your health care provider. Make sure you discuss any questions you have with your health care provider. Document Released: 03/16/2015 Document Revised: 11/07/2015 Document Reviewed: 12/19/2014 Elsevier Interactive Patient Education  2017 [place] Prevention in the Home Falls can cause injuries. They can happen to people of all ages. There are many things you can do to make your home safe and to help prevent falls. What can I do on the outside of my home? Regularly fix the edges of walkways and driveways and fix any cracks. Remove anything that might make you trip as you walk through a door, such as a raised step or threshold. Trim any bushes or trees on the path to your home. Use bright outdoor lighting. Clear any walking paths of anything that might make someone trip, such as rocks or tools. Regularly check to see if handrails are loose or broken. Make sure that both sides of any steps have handrails. Any raised decks and porches should have guardrails on the edges. Have any leaves, snow, or ice cleared regularly. Use sand or salt on walking paths during winter. Clean up any spills in your garage right away. This includes oil or grease spills. What can I do in the bathroom? Use  night lights. Install grab bars by the toilet and in the tub and shower. Do not use towel bars as grab bars. Use non-skid mats or decals in the tub or shower. If you need to sit down in the shower, use a plastic, non-slip stool. Keep the floor dry. Clean up any water that spills on the floor as soon as it happens. Remove soap buildup in the tub or shower regularly. Attach bath mats securely with double-sided non-slip rug tape. Do not have throw rugs and other things on the floor that can make you trip. What can I do in the bedroom? Use night lights. Make sure that you have a light by your bed that is easy to reach. Do not use any sheets or blankets that are too big for your bed. They should not hang down onto the floor. Have a firm chair that has side arms. You can use this for support while you get dressed. Do not have throw rugs and other things on the floor that can make you trip. What can I do in the kitchen? Clean up any spills right away. Avoid walking on wet floors. Keep items that you use a lot in easy-to-reach places. If you need to reach something above you, use a strong step stool that has a grab bar. Keep electrical cords out of the way. Do not use floor polish or wax that makes floors slippery. If  you must use wax, use non-skid floor wax. Do not have throw rugs and other things on the floor that can make you trip. What can I do with my stairs? Do not leave any items on the stairs. Make sure that there are handrails on both sides of the stairs and use them. Fix handrails that are broken or loose. Make sure that handrails are as long as the stairways. Check any carpeting to make sure that it is firmly attached to the stairs. Fix any carpet that is loose or worn. Avoid having throw rugs at the top or bottom of the stairs. If you do have throw rugs, attach them to the floor with carpet tape. Make sure that you have a light switch at the top of the stairs and the bottom of the  stairs. If you do not have them, ask someone to add them for you. What else can I do to help prevent falls? Wear shoes that: Do not have high heels. Have rubber bottoms. Are comfortable and fit you well. Are closed at the toe. Do not wear sandals. If you use a stepladder: Make sure that it is fully opened. Do not climb a closed stepladder. Make sure that both sides of the stepladder are locked into place. Ask someone to hold it for you, if possible. Clearly mark and make sure that you can see: Any grab bars or handrails. First and last steps. Where the edge of each step is. Use tools that help you move around (mobility aids) if they are needed. These include: Canes. Walkers. Scooters. Crutches. Turn on the lights when you go into a dark area. Replace any light bulbs as soon as they burn out. Set up your furniture so you have a clear path. Avoid moving your furniture around. If any of your floors are uneven, fix them. If there are any pets around you, be aware of where they are. Review your medicines with your doctor. Some medicines can make you feel dizzy. This can increase your chance of falling. Ask your doctor what other things that you can do to help prevent falls. This information is not intended to replace advice given to you by your health care provider. Make sure you discuss any questions you have with your health care provider. Document Released: 12/14/2008 Document Revised: 07/26/2015 Document Reviewed: 03/24/2014 Elsevier Interactive Patient Education  2017 [employer].

## 2021-04-08 NOTE — Progress Notes (Signed)
Subjective:  Patient ID: [name], female    DOB: 1955/09/08  Age: 66 y.o. MRN: [id number]  CC: Hospitalization Follow-up   HPI [name] presents for Transition of care for hospitalization. Also states she has a back problem. Pain between shoulder blades radiates to both sides of ribs. [facility] from [facility] called her today. Working on Biochemist, clinical for vertebroplasty.   States she can't take a good breath due to the back pain. Has oxycodone 15 mg QID per pain medicine MD.  Depression screen [facility] 2/9 04/08/2021 04/08/2021 08/28/2020  Decreased Interest 2 1 3   Down, Depressed, Hopeless 1 1 2   PHQ - 2 Score 3 2 5   Altered sleeping 2 1 2   Tired, decreased energy 2 2 2   Change in appetite 1 0 2  Feeling bad or failure about yourself  0 0 1  Trouble concentrating 1 0 2  Moving slowly or fidgety/restless 0 0 0  Suicidal thoughts 0 0 0  PHQ-9 Score 9 5 14   Difficult doing work/chores Very difficult Somewhat difficult Somewhat difficult  Some encounter information is confidential and restricted. Go to Review Flowsheets activity to see all data.  Some recent data might be hidden    History [name] has a past medical history of Allergy, Arthritis, Atypical mole (06/05/2003), Atypical nevus (07/26/2003), Basal cell carcinoma (06/05/2003), Basal cell carcinoma (05/22/2004), Basal cell carcinoma (11/27/2004), Basal cell carcinoma (11/08/2009), Basal cell carcinoma (05/24/2012), Basal cell carcinoma (06/24/2012), Basal cell carcinoma (07/29/2012), Basal cell carcinoma (06/06/2019), Cataract, Constipation, COPD (chronic obstructive pulmonary disease) ([place]), Coronary artery disease, COVID, Depression, GERD (gastroesophageal reflux disease), Heart murmur, adenomatous polyp of colon (10/03/2014), Hypercholesteremia, Hypertension, Panic attacks, Perimenopausal, Skin cancer, Squamous cell carcinoma of skin (06/05/2003), Squamous cell carcinoma of skin (05/22/2004), Squamous cell carcinoma of skin  (11/27/2004), Squamous cell carcinoma of skin (03/04/2006), Squamous cell carcinoma of skin (11/26/2006), Squamous cell carcinoma of skin (05/24/2012), Squamous cell carcinoma of skin (04/05/2013), Squamous cell carcinoma of skin (08/17/2013), Squamous cell carcinoma of skin (07/30/2016), Squamous cell carcinoma of skin (05/12/2017), Squamous cell carcinoma of skin (11/23/2018), and Tobacco user.   She has a past surgical history that includes Abdominal hysterectomy; vocal cord; Right foot toe surgery; Colonoscopy; Upper gastrointestinal endoscopy; and Cataract extraction w/PHACO (Right, 03/01/2021).   Her family history includes Alcohol abuse in her brother; Anxiety disorder in her brother and sister; Colon cancer (age of onset: 68) in her brother; Colon polyps in her brother, brother, sister, and sister; Depression in her brother and sister; Drug abuse in her son; Esophageal cancer in her sister; Heart attack in her brother, father, and mother; Heart disease in her mother.She reports that she has been smoking cigarettes. She started smoking about 51 years ago. She has a 40.00 pack-year smoking history. She has never used smokeless tobacco. She reports that she does not drink alcohol and does not use drugs.    ROS Review of Systems  Constitutional: Negative.   HENT: Negative.    Eyes:  Negative for visual disturbance.  Respiratory:  Negative for shortness of breath.   Cardiovascular:  Negative for chest pain.  Gastrointestinal:  Negative for abdominal pain.  Musculoskeletal:  Positive for arthralgias (left shoulder) and back pain.   Objective:  BP 108/71    Pulse 100    Temp 97.7 F (36.5 C)    Ht 5\' 5"  (1.651 m)    Wt 157 lb (71.2 kg)    SpO2 93%    BMI 26.13 kg/m   BP  Readings from Last 3 Encounters:  04/08/21 108/71  03/29/21 119/63  03/01/21 134/90    Wt Readings from Last 3 Encounters:  04/08/21 157 lb (71.2 kg)  04/08/21 158 lb (71.7 kg)  03/29/21 148 lb 9.4 oz (67.4 kg)      Physical Exam Constitutional:      General: She is not in acute distress.    Appearance: She is well-developed.  Cardiovascular:     Rate and Rhythm: Normal rate and regular rhythm.  Pulmonary:     Breath sounds: Normal breath sounds.  Musculoskeletal:        General: Normal range of motion.  Skin:    General: Skin is warm and dry.  Neurological:     Mental Status: She is alert and oriented to person, place, and time.      Assessment & Plan:   [name] was seen today for hospitalization follow-up.  Diagnoses and all orders for this visit:  Acute exacerbation of chronic obstructive pulmonary disease (COPD) ([place]) -     predniSONE (DELTASONE) 10 MG tablet; Take 5 daily for 3 days followed by 4,3,2 and 1 for 3 days each.  Cystitis -     Urinalysis, Complete -     Urine Culture -     Microscopic Examination       I am having [name] start on predniSONE. I am also having her maintain her aspirin EC, vitamin C, gabapentin, meloxicam, ipratropium-albuterol, oxyCODONE, cholecalciferol, albuterol, Vitamin D (Ergocalciferol), Spiriva Respimat, budesonide-formoterol, clonazePAM, DULoxetine, buPROPion, dexlansoprazole, amLODipine, atorvastatin, lisinopril, fexofenadine, lidocaine, prednisoLONE acetate, Ilevro, moxifloxacin, and cyclobenzaprine.  Allergies as of 04/08/2021       Reactions   Lorcet 10-650 [hydrocodone-acetaminophen] Itching   Sulfa Antibiotics Other (See Comments)   aching   Tape Other (See Comments)   Please use paper tape        Medication List        Accurate as of April 08, 2021  5:46 PM. If you have any questions, ask your nurse or doctor.          STOP taking these medications    cloNIDine 0.1 MG tablet Commonly known as: CATAPRES Stopped by: Sandrea Hammond, LPN   magic mouthwash (nystatin, lidocaine, diphenhydrAMINE, alum & mag hydroxide) suspension Stopped by: Sandrea Hammond, LPN       TAKE these medications    albuterol  108 (90 Base) MCG/ACT inhaler Commonly known as: VENTOLIN HFA Inhale 2 puffs into the lungs every 6 (six) hours as needed for wheezing or shortness of breath.   amLODipine 10 MG tablet Commonly known as: NORVASC TAKE ONE TABLET BY MOUTH DAILY   aspirin EC 81 MG tablet Take 81 mg by mouth daily.   atorvastatin 40 MG tablet Commonly known as: LIPITOR Take 1 tablet (40 mg total) by mouth at bedtime. (NEEDS TO BE SEEN BEFORE NEXT REFILL)   budesonide-formoterol 160-4.5 MCG/ACT inhaler Commonly known as: Symbicort INHALE TWO PUFFS INTO THE LUNGS TWICE DAILY   buPROPion 300 MG 24 hr tablet Commonly known as: Wellbutrin XL Take 1 tablet (300 mg total) by mouth daily.   cholecalciferol 25 MCG (1000 UNIT) tablet Commonly known as: VITAMIN D3 Take 1,000 Units by mouth daily.   clonazePAM 1 MG tablet Commonly known as: KLONOPIN Take 1 tablet (1 mg total) by mouth 3 (three) times daily as needed. for anxiety   cyclobenzaprine 5 MG tablet Commonly known as: FLEXERIL Take 1 tablet (5 mg total) by mouth 3 (three)  times daily as needed.   dexlansoprazole 60 MG capsule Commonly known as: DEXILANT TAKE ONE CAPSULE BY MOUTH DAILY ON AN EMPTY STOMACH, DO NOT EAT FOR ONE HOUR (NEEDS TO BE SEEN BEFORE NEXT REFILL)   DULoxetine 60 MG capsule Commonly known as: CYMBALTA Take 1 capsule (60 mg total) by mouth 2 (two) times daily.   fexofenadine 180 MG tablet Commonly known as: ALLEGRA TAKE ONE TABLET BY MOUTH DAILY FOR ALLERGY SYMPTOMS What changed: See the new instructions.   gabapentin 300 MG capsule Commonly known as: NEURONTIN Take 300 mg by mouth 3 (three) times daily.   Ilevro 0.3 % ophthalmic suspension Generic drug: nepafenac Place 1 drop into the left eye daily.   ipratropium-albuterol 0.5-2.5 (3) MG/3ML Soln Commonly known as: DUONEB Take 3 mLs by nebulization every 6 (six) hours as needed.   lidocaine 5 % Commonly known as: Lidoderm Place 1 patch onto the skin daily.  Remove & Discard patch within 12 hours or as directed by MD   lisinopril 20 MG tablet Commonly known as: ZESTRIL Take 1 tablet (20 mg total) by mouth daily. (NEEDS TO BE SEEN BEFORE NEXT REFILL)   meloxicam 15 MG tablet Commonly known as: MOBIC Take 15 mg by mouth daily.   moxifloxacin 0.5 % ophthalmic solution Commonly known as: VIGAMOX Apply 1 drop to eye 3 (three) times daily.   oxyCODONE 15 MG immediate release tablet Commonly known as: ROXICODONE Take 15 mg by mouth 4 (four) times daily as needed.   prednisoLONE acetate 1 % ophthalmic suspension Commonly known as: PRED FORTE 1 drop 3 (three) times daily.   predniSONE 10 MG tablet Commonly known as: DELTASONE Take 5 daily for 3 days followed by 4,3,2 and 1 for 3 days each. Started by: Claretta Fraise, MD   Spiriva Respimat 2.5 MCG/ACT Aers Generic drug: Tiotropium Bromide Monohydrate INHALE TWO PUFFS INTO THE LUNGS DAILY What changed: See the new instructions.   vitamin C 1000 MG tablet Take 1,000 mg by mouth daily.   Vitamin D (Ergocalciferol) 1.25 MG (50000 UNIT) Caps capsule Commonly known as: DRISDOL Take 1 capsule (50,000 Units total) by mouth every 7 (seven) days. What changed: additional instructions         Follow-up: Return in about 3 months (around 07/06/2021).  Claretta Fraise, M.D.

## 2021-04-08 NOTE — Telephone Encounter (Signed)
Ok per Dr. Estanislado Pandy for T5 VP or KP. New referral from Dr. Annette Stable

## 2021-04-09 LAB — URINE CULTURE

## 2021-04-10 ENCOUNTER — Other Ambulatory Visit (HOSPITAL_COMMUNITY): Payer: Self-pay | Admitting: Interventional Radiology

## 2021-04-10 ENCOUNTER — Telehealth (HOSPITAL_COMMUNITY): Payer: Self-pay

## 2021-04-10 DIAGNOSIS — S22050A Wedge compression fracture of T5-T6 vertebra, initial encounter for closed fracture: Secondary | ICD-10-CM

## 2021-04-10 NOTE — Telephone Encounter (Signed)
Called to schedule VP/KP, no answer, left vm. AW

## 2021-04-11 ENCOUNTER — Other Ambulatory Visit: Payer: Self-pay | Admitting: Family Medicine

## 2021-04-11 ENCOUNTER — Other Ambulatory Visit (HOSPITAL_COMMUNITY): Payer: Self-pay | Admitting: Psychiatry

## 2021-04-11 DIAGNOSIS — I10 Essential (primary) hypertension: Secondary | ICD-10-CM

## 2021-04-11 DIAGNOSIS — F332 Major depressive disorder, recurrent severe without psychotic features: Secondary | ICD-10-CM

## 2021-04-11 DIAGNOSIS — E782 Mixed hyperlipidemia: Secondary | ICD-10-CM

## 2021-04-11 DIAGNOSIS — F411 Generalized anxiety disorder: Secondary | ICD-10-CM

## 2021-04-12 MED ORDER — ATORVASTATIN CALCIUM 40 MG PO TABS: 40.0000 mg | ORAL_TABLET | Freq: Every day | ORAL | 0 refills | Status: DC

## 2021-04-12 MED ORDER — DEXLANSOPRAZOLE 60 MG PO CPDR: DELAYED_RELEASE_CAPSULE | ORAL | 0 refills | Status: DC

## 2021-04-12 MED ORDER — LISINOPRIL 20 MG PO TABS: 20.0000 mg | ORAL_TABLET | Freq: Every day | ORAL | 0 refills | Status: DC

## 2021-04-12 NOTE — Telephone Encounter (Signed)
Stacks NTBS 30 days given 03/13/21 recent visit was for acute OV not chronic ckup

## 2021-04-12 NOTE — Addendum Note (Signed)
Addended by: Antonietta Barcelona D on: 04/12/2021 01:00 PM   Modules accepted: Orders

## 2021-04-12 NOTE — Telephone Encounter (Signed)
Appt made for 3-1. Patient is having back surgery next week and couldn't come sooner.

## 2021-04-15 ENCOUNTER — Other Ambulatory Visit: Payer: Self-pay | Admitting: Family Medicine

## 2021-04-15 ENCOUNTER — Ambulatory Visit: Payer: Medicare Other | Admitting: Orthopedic Surgery

## 2021-04-15 ENCOUNTER — Other Ambulatory Visit: Payer: Self-pay | Admitting: Student

## 2021-04-15 DIAGNOSIS — I10 Essential (primary) hypertension: Secondary | ICD-10-CM

## 2021-04-16 ENCOUNTER — Ambulatory Visit (HOSPITAL_COMMUNITY)
Admission: RE | Admit: 2021-04-16 | Discharge: 2021-04-16 | Disposition: A | Discharge status: Home / Self Care | Payer: Medicare Other | Source: Ambulatory Visit | Attending: Interventional Radiology | Admitting: Interventional Radiology

## 2021-04-16 ENCOUNTER — Other Ambulatory Visit: Payer: Self-pay

## 2021-04-16 ENCOUNTER — Encounter (HOSPITAL_COMMUNITY): Payer: Self-pay

## 2021-04-16 DIAGNOSIS — I2510 Atherosclerotic heart disease of native coronary artery without angina pectoris: Secondary | ICD-10-CM | POA: Insufficient documentation

## 2021-04-16 DIAGNOSIS — F17210 Nicotine dependence, cigarettes, uncomplicated: Secondary | ICD-10-CM | POA: Insufficient documentation

## 2021-04-16 DIAGNOSIS — F1320 Sedative, hypnotic or anxiolytic dependence, uncomplicated: Secondary | ICD-10-CM | POA: Insufficient documentation

## 2021-04-16 DIAGNOSIS — F32A Depression, unspecified: Secondary | ICD-10-CM | POA: Diagnosis not present

## 2021-04-16 DIAGNOSIS — J449 Chronic obstructive pulmonary disease, unspecified: Secondary | ICD-10-CM | POA: Insufficient documentation

## 2021-04-16 DIAGNOSIS — K219 Gastro-esophageal reflux disease without esophagitis: Secondary | ICD-10-CM | POA: Insufficient documentation

## 2021-04-16 DIAGNOSIS — X58XXXA Exposure to other specified factors, initial encounter: Secondary | ICD-10-CM | POA: Insufficient documentation

## 2021-04-16 DIAGNOSIS — S22050A Wedge compression fracture of T5-T6 vertebra, initial encounter for closed fracture: Secondary | ICD-10-CM | POA: Insufficient documentation

## 2021-04-16 DIAGNOSIS — M4854XA Collapsed vertebra, not elsewhere classified, thoracic region, initial encounter for fracture: Secondary | ICD-10-CM | POA: Diagnosis not present

## 2021-04-16 HISTORY — PX: IR VERTEBROPLASTY CERV/THOR BX INC UNI/BIL INC/INJECT/IMAGING: IMG5515

## 2021-04-16 LAB — CBC WITH DIFFERENTIAL/PLATELET
Abs Immature Granulocytes: 0.18 10*3/uL — ABNORMAL HIGH (ref 0.00–0.07)
Basophils Absolute: 0 10*3/uL (ref 0.0–0.1)
Basophils Relative: 0 %
Eosinophils Absolute: 0.2 10*3/uL (ref 0.0–0.5)
Eosinophils Relative: 1 %
HCT: 42.6 % (ref 36.0–46.0)
Hemoglobin: 13.6 g/dL (ref 12.0–15.0)
Immature Granulocytes: 1 %
Lymphocytes Relative: 24 %
Lymphs Abs: 3.4 10*3/uL (ref 0.7–4.0)
MCH: 29.8 pg (ref 26.0–34.0)
MCHC: 31.9 g/dL (ref 30.0–36.0)
MCV: 93.2 fL (ref 80.0–100.0)
Monocytes Absolute: 0.8 10*3/uL (ref 0.1–1.0)
Monocytes Relative: 6 %
Neutro Abs: 9.6 10*3/uL — ABNORMAL HIGH (ref 1.7–7.7)
Neutrophils Relative %: 68 %
Platelets: 393 10*3/uL (ref 150–400)
RBC: 4.57 MIL/uL (ref 3.87–5.11)
RDW: 16.4 % — ABNORMAL HIGH (ref 11.5–15.5)
WBC: 14.2 10*3/uL — ABNORMAL HIGH (ref 4.0–10.5)
nRBC: 0 % (ref 0.0–0.2)

## 2021-04-16 LAB — BASIC METABOLIC PANEL
Anion gap: 11 (ref 5–15)
BUN: 14 mg/dL (ref 8–23)
CO2: 30 mmol/L (ref 22–32)
Calcium: 9 mg/dL (ref 8.9–10.3)
Chloride: 104 mmol/L (ref 98–111)
Creatinine, Ser: 0.71 mg/dL (ref 0.44–1.00)
GFR, Estimated: >60 mL/min (ref >60)
Glucose, Bld: 90 mg/dL (ref 70–99)
Potassium: 3.4 mmol/L — ABNORMAL LOW (ref 3.5–5.1)
Sodium: 145 mmol/L (ref 135–145)

## 2021-04-16 LAB — PROTIME-INR
INR: 1 (ref 0.8–1.2)
Prothrombin Time: 13 seconds (ref 11.4–15.2)

## 2021-04-16 MED ORDER — CEFAZOLIN SODIUM-DEXTROSE 2-4 GM/100ML-% IV SOLN
2.0000 g | INTRAVENOUS | Status: AC
  Administered 2021-04-16: 2 g via INTRAVENOUS

## 2021-04-16 MED ORDER — MIDAZOLAM HCL 2 MG/2ML IJ SOLN
INTRAMUSCULAR | Status: AC | PRN
  Administered 2021-04-16: .5 mg via INTRAVENOUS
  Administered 2021-04-16 (×2): 1 mg via INTRAVENOUS

## 2021-04-16 MED ORDER — FENTANYL CITRATE (PF) 100 MCG/2ML IJ SOLN
INTRAMUSCULAR | Status: AC | PRN
  Administered 2021-04-16 (×2): 25 ug via INTRAVENOUS
  Administered 2021-04-16: 12.5 ug via INTRAVENOUS

## 2021-04-16 MED ORDER — SODIUM CHLORIDE 0.9 % IV SOLN: INTRAVENOUS | Status: AC

## 2021-04-16 MED ORDER — FENTANYL CITRATE (PF) 100 MCG/2ML IJ SOLN
INTRAMUSCULAR | Status: AC
  Filled 2021-04-16: qty 2

## 2021-04-16 MED ORDER — MIDAZOLAM HCL 2 MG/2ML IJ SOLN
INTRAMUSCULAR | Status: AC
  Filled 2021-04-16: qty 2

## 2021-04-16 MED ORDER — GELATIN ABSORBABLE 12-7 MM EX MISC
CUTANEOUS | Status: AC
  Filled 2021-04-16: qty 1

## 2021-04-16 MED ORDER — BUPIVACAINE HCL (PF) 0.5 % IJ SOLN
INTRAMUSCULAR | Status: AC
  Administered 2021-04-16: 10 mL via SUBCUTANEOUS
  Filled 2021-04-16: qty 30

## 2021-04-16 MED ORDER — TOBRAMYCIN SULFATE 1.2 G IJ SOLR
INTRAMUSCULAR | Status: AC
  Filled 2021-04-16: qty 1.2

## 2021-04-16 MED ORDER — CEFAZOLIN SODIUM-DEXTROSE 2-4 GM/100ML-% IV SOLN
INTRAVENOUS | Status: AC
  Filled 2021-04-16: qty 100

## 2021-04-16 MED ORDER — SODIUM CHLORIDE 0.9 % IV SOLN: INTRAVENOUS | Status: DC

## 2021-04-16 NOTE — Progress Notes (Signed)
Patient daughter in law, [name] informed that procedure complete and patient will be transferred to short stay for recovery. Daughter in law informed that patient post procedure bedrest time is 3 hours.

## 2021-04-16 NOTE — Discharge Instructions (Signed)
No stooping,or bending or lifting more than 10 lbs for 2 weeks. Use walker for ambulation for 2 weeks. No driving for 2 weeks. RTC PRN 2 weeks.

## 2021-04-16 NOTE — H&P (Signed)
Chief Complaint: Patient was seen in consultation today for vertebroplasty of T5 compression fracture  at the request of [place]  Referring Physician(s): Deveshwar,Sanjeev  Supervising Physician: Luanne Bras  Patient Status: [facility] - Out-pt  History of Present Illness: [name] is a 66 y.o. female w/ PMH of arthritis, basal cell carcinoma, benzodiazepine dependence, CAD, COPD, depression, GERD, heart murmur and tobacco abuse. Pt presented to [facility] ED c/o sudden onset mild back pain 03/28/21. CT angio/chest at that time found 45% compression fracture at T5. Pt was referred by Dr. Annette Stable to Dr. Estanislado Pandy for T5 vertebroplasty for compression fracture. Dr. Estanislado Pandy approved procedure.   CT angio/chest 03/28/21: IMPRESSION: 1. No filling defect is identified in the pulmonary arterial tree to suggest pulmonary embolus. 2. 45% compression fracture T5 with 2 mm posterior bony retropulsion along the inferior endplate. The vertebral body is sclerotic and accordingly this fracture is likely subacute. 3. Healing nondisplaced fracture the right anterior fourth rib.  Past Medical History:  Diagnosis Date   Allergy    Arthritis    Atypical mole 06/05/2003   slight-mod-Left scapula (WS)   Atypical nevus 07/26/2003   persist. dysp- Left scapula (WS)   Basal cell carcinoma 06/05/2003   RIght chest (CX35FU)   Basal cell carcinoma 05/22/2004   superificial- RIght upper back- (CX35FU)   Basal cell carcinoma 11/27/2004   beside R nostril-(MOHS), below right outer nose (MOHS)   Basal cell carcinoma 11/08/2009   left post shoulder -(txpbx)   Basal cell carcinoma 05/24/2012   ulcerated- Left post shoulder- (CX35FU), ulcerated-mid forehead (EXC )   Basal cell carcinoma 06/24/2012   superificial- Right shoulder - (txpbx)    Basal cell carcinoma 07/29/2012    sclerosis- mid forehead (MOHS)   Basal cell carcinoma 06/06/2019   nod-right anterior neck (CX35FU)   Cataract     Constipation    uses OTC meds with help   COPD (chronic obstructive pulmonary disease) ([place])    Coronary artery disease    minimal nonobstructive    COVID    Depression    GERD (gastroesophageal reflux disease)    Heart murmur    Hx of adenomatous polyp of colon 10/03/2014   Hypercholesteremia    Hypertension    Panic attacks    Perimenopausal    Skin cancer    Squamous cell carcinoma of skin 06/05/2003   Right [place](CX35FU)   Squamous cell carcinoma of skin 05/22/2004   in situ- Left upperarm (CX35FU)   Squamous cell carcinoma of skin 11/27/2004   in situ- above Left elbow (CX35FU)   Squamous cell carcinoma of skin 03/04/2006   in situ- Left sideburn (CX35FU)   Squamous cell carcinoma of skin 11/26/2006   in situ- mid brow (Cx35FU)   Squamous cell carcinoma of skin 05/24/2012   in situ- Left chest- (CX35FU)   Squamous cell carcinoma of skin 04/05/2013   in situ- Left nose (Cx35FU)   Squamous cell carcinoma of skin 08/17/2013   well diff-above Left eye (txpbx)   Squamous cell carcinoma of skin 07/30/2016   in situ- right chest sup (Cx35FU)   Squamous cell carcinoma of skin 05/12/2017   in situ- RIght shoulder (CX35FU), in situ- Left forehead (CX35FU)   Squamous cell carcinoma of skin 11/23/2018   in situ- Right mid shin, inner- (MOHS)   Tobacco user     Past Surgical History:  Procedure Laterality Date   ABDOMINAL HYSTERECTOMY     CATARACT EXTRACTION W/PHACO Right 03/01/2021   Procedure:  CATARACT EXTRACTION PHACO AND INTRAOCULAR LENS PLACEMENT (IOC);  Surgeon: Baruch Goldmann, MD;  Location: [facility];  Service: Ophthalmology;  Laterality: Right;  CDE: 7.31   COLONOSCOPY     Right foot toe surgery     UPPER GASTROINTESTINAL ENDOSCOPY     vocal cord     polyp removal    Allergies: Lorcet 10-650 [hydrocodone-acetaminophen], Sulfa antibiotics, Tape, and Wellbutrin xl [bupropion hcl er (xl)]  Medications: Prior to Admission medications   Medication Sig Start Date End  Date Taking? Authorizing Provider  acetaminophen (TYLENOL) 500 MG tablet Take 1,000 mg by mouth every 6 (six) hours as needed for moderate pain.   Yes [provider]  amLODipine (NORVASC) 10 MG tablet TAKE ONE TABLET BY MOUTH DAILY 03/13/21  Yes Branch, Alphonse Guild, MD  Ascorbic Acid (VITAMIN C) 1000 MG tablet Take 1,000 mg by mouth daily.   Yes [provider]  budesonide-formoterol (SYMBICORT) 160-4.5 MCG/ACT inhaler INHALE TWO PUFFS INTO THE LUNGS TWICE DAILY 11/09/20  Yes Stacks, Cletus Gash, MD  clonazePAM (KLONOPIN) 1 MG tablet Take 1 tablet (1 mg total) by mouth 3 (three) times daily as needed. for anxiety 01/29/21  Yes Cloria Spring, MD  cyclobenzaprine (FLEXERIL) 5 MG tablet Take 1 tablet (5 mg total) by mouth 3 (three) times daily as needed. 04/03/21  Yes Claretta Fraise, MD  DULoxetine (CYMBALTA) 60 MG capsule Take 1 capsule (60 mg total) by mouth 2 (two) times daily. 01/29/21  Yes Cloria Spring, MD  fexofenadine (ALLEGRA) 180 MG tablet TAKE ONE TABLET BY MOUTH DAILY FOR ALLERGY SYMPTOMS Patient taking differently: Take 180 mg by mouth daily. 03/18/21  Yes Stacks, Cletus Gash, MD  gabapentin (NEURONTIN) 600 MG tablet Take 600 mg by mouth 3 (three) times daily.   Yes [provider]  ipratropium-albuterol (DUONEB) 0.5-2.5 (3) MG/3ML SOLN Take 3 mLs by nebulization every 6 (six) hours as needed. 02/28/20  Yes Claretta Fraise, MD  naproxen sodium (ALEVE) 220 MG tablet Take 220 mg by mouth 2 (two) times daily as needed (pain).   Yes [provider]  oxyCODONE (ROXICODONE) 15 MG immediate release tablet Take 15 mg by mouth 4 (four) times daily as needed. 03/08/20  Yes [provider]  predniSONE (DELTASONE) 10 MG tablet Take 5 daily for 3 days followed by 4,3,2 and 1 for 3 days each. 04/08/21  Yes Stacks, Cletus Gash, MD  [place] 2.5 MCG/ACT AERS INHALE TWO PUFFS INTO THE LUNGS DAILY Patient taking differently: Inhale 2 puffs into the lungs daily. 11/09/20  Yes  Stacks, Cletus Gash, MD  Vitamin D, Ergocalciferol, (DRISDOL) 1.25 MG (50000 UNIT) CAPS capsule Take 1 capsule (50,000 Units total) by mouth every 7 (seven) days. Patient taking differently: Take 50,000 Units by mouth every 7 (seven) days. Sunday 08/31/20  Yes Claretta Fraise, MD  albuterol (VENTOLIN HFA) 108 (90 Base) MCG/ACT inhaler Inhale 2 puffs into the lungs every 6 (six) hours as needed for wheezing or shortness of breath. Patient not taking: Reported on 04/11/2021 07/11/20   Marshell Garfinkel, MD  atorvastatin (LIPITOR) 40 MG tablet Take 1 tablet (40 mg total) by mouth at bedtime. 04/12/21   Claretta Fraise, MD  buPROPion (WELLBUTRIN XL) 300 MG 24 hr tablet Take 1 tablet (300 mg total) by mouth daily. Patient not taking: Reported on 04/11/2021 01/29/21   Cloria Spring, MD  dexlansoprazole (DEXILANT) 60 MG capsule TAKE ONE CAPSULE BY MOUTH DAILY ON AN EMPTY STOMACH, DO NOT EAT FOR ONE HOUR 04/12/21   Claretta Fraise, MD  lidocaine (LIDODERM) 5 % Place 1 patch onto the skin daily. Remove & Discard patch within 12 hours or as directed by MD Patient not taking: Reported on 04/11/2021 03/28/21   Evalee Jefferson, PA-C  lisinopril (ZESTRIL) 20 MG tablet Take 1 tablet (20 mg total) by mouth daily. 04/12/21   Claretta Fraise, MD     Family History  Problem Relation Age of Onset   Heart disease Mother    Heart attack Mother    Heart attack Father    Heart attack Brother    Colon cancer Brother 56       died at age 82   Anxiety disorder Brother    Depression Brother    Alcohol abuse Brother    Colon polyps Brother    Colon polyps Brother    Colon polyps Sister    Esophageal cancer Sister    Anxiety disorder Sister    Depression Sister    Colon polyps Sister    Drug abuse Son    Rectal cancer Neg Hx    Stomach cancer Neg Hx     Social History   Socioeconomic History   Marital status: Single    Spouse name: Not on file   Number of children: 2   Years of education: Not on file   Highest education level:  Not on file  Occupational History   Occupation: Scientist, 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   Sexual activity: Not on file  Other Topics Concern   Not on file  Social History Narrative   Retired/ lives alone in one level home   Son and daughter in law that live next door and help her out a lot         Social Determinants of Health   Financial Resource Strain: High Risk   Difficulty of Paying Living Expenses: Very hard  Food Insecurity: No Food Insecurity   Worried About Charity fundraiser in the Last Year: Never true   Arboriculturist in the Last Year: Never true  Transportation Needs: No Transportation Needs   Lack of Transportation (Medical): No   Lack of Transportation (Non-Medical): No  Physical Activity: Inactive   Days of Exercise per Week: 0 days   Minutes of Exercise per Session: 0 min  Stress: Stress Concern Present   Feeling of Stress : To some extent  Social Connections: Socially Isolated   Frequency of Communication with Friends and Family: More than three times a week   Frequency of Social Gatherings with Friends and Family: More than three times a week   Attends Religious Services: Never   Marine scientist or Organizations: No   Attends Archivist Meetings: Never   Marital Status: Divorced    Review of Systems: A 12 point ROS discussed and pertinent positives are indicated in the HPI above.  All other systems are negative.  Review of Systems  Constitutional:  Negative for appetite change, chills and fever.  HENT:   Negative for nosebleeds.   Eyes:  Negative for visual disturbance.  Respiratory:  Positive for cough and shortness of breath.   Cardiovascular:  Negative for chest pain and leg swelling.  Gastrointestinal:  Negative for abdominal pain, blood in stool, nausea and vomiting.  Genitourinary:  Negative for hematuria.  Musculoskeletal:  Positive for back pain.  Neurological:  Positive for headaches. Negative for dizziness and light-headedness.   Vital Signs: BP (!) 144/94    Pulse 94    Temp 98.2 F (36.8 C) (Oral)    Resp 18    Ht 5\' 5"  (1.651 m)    Wt 158 lb (71.7 kg)    SpO2 99%    BMI 26.29 kg/m   Physical Exam Constitutional:      Appearance: Normal appearance.  HENT:     Head: Normocephalic and atraumatic.     Mouth/Throat:     Mouth: Mucous membranes are moist.     Pharynx: Oropharynx is clear.  Eyes:     Extraocular Movements: Extraocular movements intact.     Pupils: Pupils are equal, round, and reactive to light.  Cardiovascular:     Rate and Rhythm: Regular rhythm. Tachycardia present.     Pulses: Normal pulses.     Heart sounds: Normal heart sounds. No murmur heard.   No friction rub. No gallop.  Pulmonary:     Effort: Pulmonary effort is normal. No respiratory distress.     Breath sounds: No stridor. Wheezing present. No rhonchi or rales.     Comments: Expiratory wheezing to RUL Abdominal:     General: Bowel sounds are normal. There is no distension.     Tenderness: There is no abdominal tenderness. There is no guarding.  Musculoskeletal:     Right lower leg: No edema.     Left lower leg: No edema.  Skin:    General: Skin is warm and dry.  Neurological:     Mental Status: She is alert and oriented to person, place, and time.  Psychiatric:        Mood and Affect: Mood normal.        Behavior: Behavior normal.        Thought Content: Thought content normal.        Judgment: Judgment normal.    Imaging: CT Angio Chest PE W and/or Wo Contrast  Result Date:  03/28/2021 CLINICAL DATA:  Posterior chest pain.  Elevated D-dimer. EXAM: CT ANGIOGRAPHY CHEST WITH CONTRAST TECHNIQUE: Multidetector CT imaging of the chest was performed using the standard protocol during bolus administration of intravenous contrast. Multiplanar CT image reconstructions and MIPs were obtained to evaluate the vascular anatomy. RADIATION DOSE REDUCTION: This exam was performed according to the departmental dose-optimization program which includes automated exposure control, adjustment of the mA and/or kV according to patient size and/or use of iterative reconstruction technique. CONTRAST:  9mL OMNIPAQUE IOHEXOL 350 MG/ML SOLN COMPARISON:  Thoracic spine CT 03/26/2021.  Chest CT 03/22/2020 FINDINGS: Cardiovascular: No filling defect is identified in the pulmonary arterial tree to suggest pulmonary embolus. Coronary, aortic arch, and branch vessel atherosclerotic vascular disease. Aortic valve calcifications noted. Mediastinum/Nodes: 0.6 cm an adjacent 0.7 cm left level IV lymph nodes in the lower neck on images 3 and 7 of series 4. Roughly stable from 03/22/2020. No pathologic adenopathy is identified. Lungs/Pleura: Mild scarring anteriorly in the right upper lobe just above the minor fissure, stable. Mild atelectasis or scarring in the  right lower lobe adjacent to the major fissure, new from 03/22/2020. Linear bandlike opacity in the right lower lobe on image 112 of series 6 favors atelectasis or scarring and is new from 03/22/2020. Ground-glass density 5 by 4 mm right upper lobe nodule on image 61 series 6, previously 0.4 cm on 03/22/2020. Emphysema noted. Upper Abdomen: 1.6 by 1.4 cm left adrenal mass with internal density -12 Hounsfield units compatible with adenoma. Fluid density renal lesions are probably cysts although not fully characterized. Musculoskeletal: 45% compression fracture at T5 with about 2 mm of posterior retropulsion along the inferior endplate of L5. There is associated  sclerosis and accordingly this is likely subacute. Healing nondisplaced fracture of the right anterior fourth rib on image 74 series 6 Review of the MIP images confirms the above findings. IMPRESSION: 1. No filling defect is identified in the pulmonary arterial tree to suggest pulmonary embolus. 2. 45% compression fracture T5 with 2 mm posterior bony retropulsion along the inferior endplate. The vertebral body is sclerotic and accordingly this fracture is likely subacute. 3. Healing nondisplaced fracture the right anterior fourth rib. 4. 5 by 4 mm ground-glass density right upper lobe pulmonary nodule on image 61 series 6, previously 0.4 cm on 03/22/2020. Subjectively this appears mildly larger, and was even less apparent on chest CT from 03/06/2019. Given the patient's smoking history, surveillance by chest CT in 12 months time is recommended. 5. Other imaging findings of potential clinical significance: Aortic Atherosclerosis (ICD10-I70.0) and Emphysema (ICD10-J43.9). Coronary and systemic atherosclerosis. Aortic valve calcifications. Scarring versus mild atelectasis in the right lower lobe. Left adrenal adenoma. Electronically Signed   By: Van Clines M.D.   On: 03/28/2021 14:40   DG Chest Port 1 View  Result Date: 03/28/2021 CLINICAL DATA:  Back pain and pleuritic chest pain status post lifting injury 2 weeks prior to imaging. EXAM: PORTABLE CHEST 1 VIEW COMPARISON:  Multiple exams, including thoracic spine CT from [facility] rocking hand dated 03/26/2021 and chest radiograph from 02/05/2021 FINDINGS: The previously demonstrated 40% T5 vertebral body compression fracture is poorly appreciated on today's frontal chest radiograph. Atherosclerotic calcification of the aortic arch. Suspected linear subsegmental atelectasis at the lung bases; otherwise the lungs appear clear. No blunting of the costophrenic angles. Cardiac and mediastinal margins appear normal. IMPRESSION: 1. Bibasilar linear subsegmental  atelectasis. Otherwise the lungs appear clear. 2.  Aortic Atherosclerosis (ICD10-I70.0). 3. The patient has a known acute or subacute T5 compression fracture shown on thoracic spine CT from 03/26/2021. Electronically Signed   By: Van Clines M.D.   On: 03/28/2021 11:56    Labs:  CBC: Recent Labs    08/28/20 1545 02/05/21 1034 03/28/21 1133 03/29/21 0349  WBC 10.9* 13.0* 20.9* 11.4*  HGB 14.3 13.4 13.8 11.8*  HCT 42.6 40.3 43.0 37.6  PLT 378 284 480* 411*    COAGS: Recent Labs    03/29/21 0349  APTT 23*    BMP: Recent Labs    08/28/20 1545 02/05/21 1034 03/28/21 1133 03/29/21 0349  NA 141 140 141 141  K 4.5 3.6 3.4* 3.9  CL 101 105 107 106  CO2 25 26 26 27   GLUCOSE 99 97 116* 136*  BUN 12 21 24* 27*  CALCIUM 9.5 8.9 9.0 8.8*  CREATININE 0.72 0.72 0.72 0.76  GFRNONAA  --  >60 >60 >60    LIVER FUNCTION TESTS: Recent Labs    08/28/20 1545 02/05/21 1034 03/28/21 1133 03/29/21 0349  BILITOT 0.2 0.3 0.3 0.2*  AST 9 10*  19 12*  ALT 12 17 16 13   ALKPHOS 123* 67 103 83  PROT 6.4 6.1* 6.8 6.0*  ALBUMIN 4.2 3.5 3.7 3.2*    TUMOR MARKERS: No results for input(s): AFPTM, CEA, CA199, CHROMGRNA in the last 8760 hours.  Assessment and Plan: History of arthritis, basal cell carcinoma, benzodiazepine dependence, CAD, COPD, depression, GERD, heart murmur and tobacco abuse. Pt presented to [facility] ED c/o sudden onset mild back pain 03/28/21. CT angio/chest at that time found 45% compression fracture at T5. Pt was referred by Dr. Annette Stable to Dr. Estanislado Pandy for T5 vertebroplasty for compression fracture. Dr. Estanislado Pandy approved procedure.   CT angio/chest 03/28/21: IMPRESSION: 1. No filling defect is identified in the pulmonary arterial tree to suggest pulmonary embolus. 2. 45% compression fracture T5 with 2 mm posterior bony retropulsion along the inferior endplate. The vertebral body is sclerotic and accordingly this fracture is likely subacute. 3. Healing nondisplaced  fracture the right anterior fourth rib.  Pt resting in bed watching TV.  She is A&O, calm and pleasant. She is in no distress.  Pt states she is NPO per order.  Pt does not take blood thinning medications.   Risks and benefits of vertebroplasty of T5 compression fracture  were discussed with the patient including, but not limited to education regarding the natural healing process of compression fractures without intervention, bleeding, infection, cement migration which may cause spinal cord damage, paralysis, pulmonary embolism or even death.  This interventional procedure involves the use of X-rays and because of the nature of the planned procedure, it is possible that we will have prolonged use of X-ray fluoroscopy.  Potential radiation risks to you include (but are not limited to) the following: - A slightly elevated risk for cancer  several years later in life. This risk is typically less than 0.5% percent. This risk is low in comparison to the normal incidence of human cancer, which is 33% for women and 50% for men according to the [place]. - Radiation induced injury can include skin redness, resembling a rash, tissue breakdown / ulcers and hair loss (which can be temporary or permanent).   The likelihood of either of these occurring depends on the difficulty of the procedure and whether you are sensitive to radiation due to previous procedures, disease, or genetic conditions.   IF your procedure requires a prolonged use of radiation, you will be notified and given written instructions for further action.  It is your responsibility to monitor the irradiated area for the 2 weeks following the procedure and to notify your physician if you are concerned that you have suffered a radiation induced injury.    All of the patient's questions were answered, patient is agreeable to proceed.  Consent signed and in chart.    Thank you for this interesting consult.  I greatly  enjoyed meeting [name] and look forward to participating in their care.  A copy of this report was sent to the requesting provider on this date.  Electronically Signed: Tyson Alias, NP 04/16/2021, 10:27 AM   I spent a total of 20 minutes in face to face in clinical consultation, greater than 50% of which was counseling/coordinating care for vertebroplasty of T5 compression fracture.

## 2021-04-16 NOTE — Procedures (Signed)
INR  S/P T 5 VP   S.Estelene Carmack MD

## 2021-04-18 ENCOUNTER — Encounter: Payer: Medicare Other | Admitting: Dermatology

## 2021-04-18 DIAGNOSIS — M6281 Muscle weakness (generalized): Secondary | ICD-10-CM | POA: Diagnosis not present

## 2021-04-23 ENCOUNTER — Other Ambulatory Visit: Payer: Self-pay

## 2021-04-23 ENCOUNTER — Other Ambulatory Visit: Payer: Self-pay | Admitting: Family Medicine

## 2021-04-23 ENCOUNTER — Encounter (HOSPITAL_COMMUNITY): Payer: Self-pay | Admitting: Psychiatry

## 2021-04-23 ENCOUNTER — Telehealth (INDEPENDENT_AMBULATORY_CARE_PROVIDER_SITE_OTHER): Payer: Medicare Other | Admitting: Psychiatry

## 2021-04-23 DIAGNOSIS — F411 Generalized anxiety disorder: Secondary | ICD-10-CM | POA: Diagnosis not present

## 2021-04-23 DIAGNOSIS — F332 Major depressive disorder, recurrent severe without psychotic features: Secondary | ICD-10-CM

## 2021-04-23 MED ORDER — CLONAZEPAM 1 MG PO TABS: 1.0000 mg | ORAL_TABLET | Freq: Three times a day (TID) | ORAL | 2 refills | Status: DC | PRN

## 2021-04-23 MED ORDER — DULOXETINE HCL 60 MG PO CPEP: 60.0000 mg | ORAL_CAPSULE | Freq: Two times a day (BID) | ORAL | 2 refills | Status: DC

## 2021-04-23 NOTE — Progress Notes (Signed)
Virtual Visit via Telephone Note  I connected with [name] on 04/23/21 at  2:00 PM EST by telephone and verified that I am speaking with the correct person using two identifiers.  Location: Patient: home Provider: office   I discussed the limitations, risks, security and privacy concerns of performing an evaluation and management service by telephone and the availability of in person appointments. I also discussed with the patient that there may be a patient responsible charge related to this service. The patient expressed understanding and agreed to proceed.     I discussed the assessment and treatment plan with the patient. The patient was provided an opportunity to ask questions and all were answered. The patient agreed with the plan and demonstrated an understanding of the instructions.   The patient was advised to call back or seek an in-person evaluation if the symptoms worsen or if the condition fails to improve as anticipated.  I provided 15 minutes of non-face-to-face time during this encounter.   Kerry Spiller, MD  [facility] MD/PA/NP OP Progress Note  04/23/2021 2:19 PM [name]  MRN:  [id number]  Chief Complaint:  Chief Complaint  Patient presents with   Depression   Anxiety   Follow-up   HPI: This patient is a 66 year old female who is widowed and lives alone in [place].  She is on disability.  She returns after 3 months regarding depression and anxiety  The patient has had a hospitalization since I last saw her.  She sat in a chair last month it broke and she fell on the concrete.  She had a vertebral fracture at the T5 level.  She also develops a COPD exacerbation.  She also has a rotator cuff tear .she was hospitalized for 1 day.  She was on steroids for a while but just completed them.  She is also on pain medication.  She is slowly getting better but her mobility is limited.  The patient also is trying to work on quitting smoking.  She is under 1 pack a day.  We  tried adding Wellbutrin but it made her feel strange so she stopped it.  She states despite all her of her medical issues her mood has been fairly stable.  She denies significant depression anxiety thoughts of self-harm or suicidal ideation.  She is sleeping fairly well. Visit Diagnosis:    ICD-10-CM   1. GAD (generalized anxiety disorder)  F41.1 clonazePAM (KLONOPIN) 1 MG tablet    2. Severe episode of recurrent major depressive disorder, without psychotic features ([place])  F33.2 DULoxetine (CYMBALTA) 60 MG capsule      Past Psychiatric History: none  Past Medical History:  Past Medical History:  Diagnosis Date   Allergy    Arthritis    Atypical mole 06/05/2003   slight-mod-Left scapula (WS)   Atypical nevus 07/26/2003   persist. dysp- Left scapula (WS)   Basal cell carcinoma 06/05/2003   RIght chest (CX35FU)   Basal cell carcinoma 05/22/2004   superificial- RIght upper back- (CX35FU)   Basal cell carcinoma 11/27/2004   beside R nostril-(MOHS), below right outer nose (MOHS)   Basal cell carcinoma 11/08/2009   left post shoulder -(txpbx)   Basal cell carcinoma 05/24/2012   ulcerated- Left post shoulder- (CX35FU), ulcerated-mid forehead (EXC )   Basal cell carcinoma 06/24/2012   superificial- Right shoulder - (txpbx)    Basal cell carcinoma 07/29/2012    sclerosis- mid forehead (MOHS)   Basal cell carcinoma 06/06/2019   nod-right anterior  neck (CX35FU)   Cataract    Constipation    uses OTC meds with help   COPD (chronic obstructive pulmonary disease) (HCC)    Coronary artery disease    minimal nonobstructive    COVID    Depression    GERD (gastroesophageal reflux disease)    Heart murmur    Hx of adenomatous polyp of colon 10/03/2014   Hypercholesteremia    Hypertension    Panic attacks    Perimenopausal    Skin cancer    Squamous cell carcinoma of skin 06/05/2003   Right Temple(CX35FU)   Squamous cell carcinoma of skin 05/22/2004   in situ- Left upperarm (CX35FU)    Squamous cell carcinoma of skin 11/27/2004   in situ- above Left elbow (CX35FU)   Squamous cell carcinoma of skin 03/04/2006   in situ- Left sideburn (CX35FU)   Squamous cell carcinoma of skin 11/26/2006   in situ- mid brow (Cx35FU)   Squamous cell carcinoma of skin 05/24/2012   in situ- Left chest- (CX35FU)   Squamous cell carcinoma of skin 04/05/2013   in situ- Left nose (Cx35FU)   Squamous cell carcinoma of skin 08/17/2013   well diff-above Left eye (txpbx)   Squamous cell carcinoma of skin 07/30/2016   in situ- right chest sup (Cx35FU)   Squamous cell carcinoma of skin 05/12/2017   in situ- RIght shoulder (CX35FU), in situ- Left forehead (CX35FU)   Squamous cell carcinoma of skin 11/23/2018   in situ- Right mid shin, inner- (MOHS)   Tobacco user     Past Surgical History:  Procedure Laterality Date   ABDOMINAL HYSTERECTOMY     CATARACT EXTRACTION W/PHACO Right 03/01/2021   Procedure: CATARACT EXTRACTION PHACO AND INTRAOCULAR LENS PLACEMENT (IOC);  Surgeon: Baruch Goldmann, MD;  Location: [facility];  Service: Ophthalmology;  Laterality: Right;  CDE: 7.31   COLONOSCOPY     IR VERTEBROPLASTY CERV/THOR BX INC UNI/BIL INC/INJECT/IMAGING  04/16/2021   Right foot toe surgery     UPPER GASTROINTESTINAL ENDOSCOPY     vocal cord     polyp removal    Family Psychiatric History: see below  Family History:  Family History  Problem Relation Age of Onset   Heart disease Mother    Heart attack Mother    Heart attack Father    Heart attack Brother    Colon cancer Brother 14       died at age 37   Anxiety disorder Brother    Depression Brother    Alcohol abuse Brother    Colon polyps Brother    Colon polyps Brother    Colon polyps Sister    Esophageal cancer Sister    Anxiety disorder Sister    Depression Sister    Colon polyps Sister    Drug abuse Son    Rectal cancer Neg Hx    Stomach cancer Neg Hx     Social History:  Social History   Socioeconomic History   Marital  status: Single    Spouse name: Not on file   Number of children: 2   Years of education: Not on file   Highest education level: Not on file  Occupational History   Occupation: Scientist, 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   Sexual activity: Not on file  Other Topics Concern   Not on file  Social History Narrative   Retired/ lives alone in one level home   Son and daughter in law that live next door and help her out a lot         Social Determinants of Health   Financial Resource Strain: High Risk   Difficulty of Paying Living Expenses: Very hard  Food Insecurity: No Food Insecurity   Worried About Charity fundraiser in the Last Year: Never true   Arboriculturist in the Last Year: Never true  Transportation Needs: No Transportation Needs   Lack of Transportation (Medical): No   Lack of Transportation (Non-Medical): No  Physical Activity: Inactive   Days of Exercise per Week: 0 days   Minutes of Exercise per Session: 0 min  Stress: Stress Concern Present   Feeling of Stress : To some extent  Social Connections: Socially Isolated   Frequency of Communication with Friends and Family: More than three times a week   Frequency of Social Gatherings with Friends and Family: More than three times a week   Attends Religious Services: Never   Marine scientist or Organizations: No   Attends Archivist Meetings: Never   Marital Status: Divorced    Allergies:  Allergies  Allergen Reactions   Lorcet 10-650 [Hydrocodone-Acetaminophen] Itching    Sulfa Antibiotics Other (See Comments)    aching   Tape Other (See Comments)    Tear skin. Please use paper tape   Wellbutrin Xl [Bupropion Hcl Er (Xl)]     Upset stomach /bad taste in mouth    Metabolic Disorder Labs: Lab Results  Component Value Date   HGBA1C 5.9 (H) 07/12/2013   MPG 123 (H) 07/12/2013   No results found for: PROLACTIN Lab Results  Component Value Date   CHOL 172 08/28/2020   TRIG 152 (H) 08/28/2020   HDL 50 08/28/2020   CHOLHDL 3.4 08/28/2020   VLDL 36 07/13/2013   LDLCALC 96 08/28/2020   LDLCALC 109 (H) 02/28/2020   Lab Results  Component Value Date   TSH 1.820 05/25/2017   TSH 1.080 05/17/2015    Therapeutic Level Labs: No results found for: LITHIUM No results found for: VALPROATE No components found for:  CBMZ  Current Medications: Current Outpatient Medications  Medication Sig Dispense Refill   acetaminophen (TYLENOL) 500 MG tablet Take 1,000 mg by mouth every 6 (six) hours as needed for moderate pain.     albuterol (VENTOLIN HFA) 108 (90 Base) MCG/ACT inhaler Inhale 2 puffs into the lungs every 6 (six) hours as needed for wheezing or shortness of breath. (Patient not taking: Reported on 04/11/2021) 8 g 5   amLODipine (NORVASC) 10 MG tablet TAKE ONE TABLET BY MOUTH DAILY 90 tablet 3   Ascorbic Acid (VITAMIN C) 1000 MG tablet Take 1,000 mg by mouth daily.     atorvastatin (LIPITOR) 40 MG tablet Take 1 tablet (40 mg total) by mouth at bedtime. 30 tablet 0   budesonide-formoterol (SYMBICORT) 160-4.5 MCG/ACT inhaler INHALE TWO PUFFS INTO THE LUNGS TWICE DAILY 10.2 g 2   clonazePAM (  KLONOPIN) 1 MG tablet Take 1 tablet (1 mg total) by mouth 3 (three) times daily as needed. for anxiety 90 tablet 2   cyclobenzaprine (FLEXERIL) 5 MG tablet Take 1 tablet (5 mg total) by mouth 3 (three) times daily as needed. 90 tablet 0   dexlansoprazole (DEXILANT) 60 MG capsule TAKE ONE CAPSULE BY MOUTH DAILY ON AN EMPTY STOMACH, DO NOT EAT FOR ONE HOUR 30 capsule 0    DULoxetine (CYMBALTA) 60 MG capsule Take 1 capsule (60 mg total) by mouth 2 (two) times daily. 60 capsule 2   fexofenadine (ALLEGRA) 180 MG tablet TAKE ONE TABLET BY MOUTH DAILY FOR ALLERGY SYMPTOMS (Patient taking differently: Take 180 mg by mouth daily.) 30 tablet 11   gabapentin (NEURONTIN) 600 MG tablet Take 600 mg by mouth 3 (three) times daily.     ipratropium-albuterol (DUONEB) 0.5-2.5 (3) MG/3ML SOLN Take 3 mLs by nebulization every 6 (six) hours as needed. 360 mL 5   lidocaine (LIDODERM) 5 % Place 1 patch onto the skin daily. Remove & Discard patch within 12 hours or as directed by MD (Patient not taking: Reported on 04/11/2021) 30 patch 0   lisinopril (ZESTRIL) 20 MG tablet Take 1 tablet (20 mg total) by mouth daily. 30 tablet 0   naproxen sodium (ALEVE) 220 MG tablet Take 220 mg by mouth 2 (two) times daily as needed (pain).     oxyCODONE (ROXICODONE) 15 MG immediate release tablet Take 15 mg by mouth 4 (four) times daily as needed.     SPIRIVA RESPIMAT 2.5 MCG/ACT AERS INHALE TWO PUFFS INTO THE LUNGS DAILY 4 g 5   Vitamin D, Ergocalciferol, (DRISDOL) 1.25 MG (50000 UNIT) CAPS capsule Take 1 capsule (50,000 Units total) by mouth every 7 (seven) days. (Patient taking differently: Take 50,000 Units by mouth every 7 (seven) days. Sunday) 13 capsule 3   No current facility-administered medications for this visit.     Musculoskeletal: Strength & Muscle Tone: na Gait & Station: na Patient leans: N/A  Psychiatric Specialty Exam: Review of Systems  Respiratory:  Positive for shortness of breath.   Musculoskeletal:  Positive for arthralgias, back pain, gait problem and joint swelling.  All other systems reviewed and are negative.  There were no vitals taken for this visit.There is no height or weight on file to calculate BMI.  General Appearance: NA  Eye Contact:  NA  Speech:  Clear and Coherent  Volume:  Normal  Mood:  Euthymic  Affect:  NA  Thought Process:  Goal Directed   Orientation:  Full (Time, Place, and Person)  Thought Content: Rumination   Suicidal Thoughts:  No  Homicidal Thoughts:  No  Memory:  Immediate;   Good Recent;   Good Remote;   Fair  Judgement:  Good  Insight:  Fair  Psychomotor Activity:  Decreased  Concentration:  Concentration: Fair and Attention Span: Fair  Recall:  Good  Fund of Knowledge: Good  Language: Good  Akathisia:  No  Handed:  Right  AIMS (if indicated): not done  Assets:  Communication Skills Desire for Improvement Resilience Social Support Talents/Skills  ADL's:  Intact  Cognition: WNL  Sleep:  Good   Screenings: GAD-7    Flowsheet Row Office Visit from 04/08/2021 in [place] Office Visit from 08/28/2020 in [place] from 11/20/2015 in [facility] Office Visit from 02/09/2015 in [place]  Total GAD-7 Score 5 7 9  21  PHQ2-9    Flowsheet Row Video Visit from 04/23/2021 in [place] Most recent reading at 04/23/2021  2:11 PM Office Visit from 04/08/2021 in [place] Most recent reading at 04/08/2021  3:26 PM Clinical Support from 04/08/2021 in [place] Most recent reading at 04/08/2021  1:42 PM Video Visit from 01/29/2021 in [place] Most recent reading at 01/29/2021  2:07 PM Video Visit from 10/29/2020 in [place] Most recent reading at 10/29/2020  1:25 PM  PHQ-2 Total Score 1 3 2 2 1   PHQ-9 Total Score -- 9 5 5  --      Flowsheet Row Video Visit from 04/23/2021 in [place] from 04/16/2021 in [facility] ED to Hosp-Admission (Discharged) from 03/28/2021 in [place] No Risk No Risk No Risk        Assessment and Plan: This patient is a 66 year old female with a history of depression and anxiety.  She has had a lot of medical issues recently but her mood has remained stable.  She is also trying to work on smoking cessation.  Wellbutrin did not work out well and she is going to try NicoDerm patches from her primary physician.  For now she will continue Cymbalta 60 mg twice daily for depression and clonazepam 1 mg 3 times daily for anxiety.  She will return to see me in 3 months  Collaboration of Care: Collaboration of Care: Primary Care Provider AEB chart notes are available to PCP through the epic system  Patient/Guardian was advised Release of Information must be obtained prior to any record release in order to collaborate their care with an outside provider. Patient/Guardian was advised if they have not already done so to contact the registration department to sign all necessary forms in order for [place] to release information regarding their care.   Consent: Patient/Guardian gives verbal consent for treatment and assignment of benefits for services provided during this visit. Patient/Guardian expressed understanding and agreed to proceed.    Kerry Spiller, MD 04/23/2021, 2:19 PM

## 2021-04-29 ENCOUNTER — Ambulatory Visit (INDEPENDENT_AMBULATORY_CARE_PROVIDER_SITE_OTHER): Payer: Medicare Other | Admitting: Orthopedic Surgery

## 2021-04-29 ENCOUNTER — Encounter: Payer: Self-pay | Admitting: Orthopedic Surgery

## 2021-04-29 DIAGNOSIS — M75112 Incomplete rotator cuff tear or rupture of left shoulder, not specified as traumatic: Secondary | ICD-10-CM

## 2021-04-29 NOTE — Patient Instructions (Signed)

## 2021-04-29 NOTE — Progress Notes (Signed)
New Patient Visit  Assessment: [name] is a 66 y.o.  RHD female with the following: 1. Partial nontraumatic rupture of left rotator cuff  Plan: [name] has a partial injury to her left rotator cuff tendons.  Her range of motion and strength overall is pretty good.  She has not had an injection.  I think this is reasonable to try at this time.  I provided her with simple exercises to continue to perform.  She will follow-up as needed.   Procedure note injection Left shoulder    Verbal consent was obtained to inject the left shoulder, subacromial space Timeout was completed to confirm the site of injection.  The skin was prepped with alcohol and ethyl chloride was sprayed at the injection site.  A 21-gauge needle was used to inject 40 mg of Depo-Medrol and 1% lidocaine (3 cc) into the subacromial space of the left shoulder using a posterolateral approach.  There were no complications. A sterile bandage was applied.    Follow-up: Return if symptoms worsen or fail to improve.  Subjective:  Chief Complaint  Patient presents with   Shoulder Pain    Left shld pain, hurt it lifting her sister in January.  Then hurt it again Feb when she sat in a chair and the arms broke.  Decreased ROM, painful.     History of Present Illness: [name] is a 66 y.o. female who has been referred to clinic today by [name] for evaluation of left shoulder pain.  She hurt her shoulder approximately 2 months ago, after moving her sister.  More recently, she fell when a chair she was sitting and broke.  Her pain is worse at night.  She is taking some medications, and is currently followed by pain management.  She has not had any injections.  She has not worked with physical therapy.  Pain is in the anterior and lateral aspect of the left shoulder.  Review of Systems: No fevers or chills No numbness or tingling No chest pain No shortness of breath No bowel or bladder dysfunction No GI  distress No headaches   Medical History:  Past Medical History:  Diagnosis Date   Allergy    Arthritis    Atypical mole 06/05/2003   slight-mod-Left scapula (WS)   Atypical nevus 07/26/2003   persist. dysp- Left scapula (WS)   Basal cell carcinoma 06/05/2003   RIght chest (CX35FU)   Basal cell carcinoma 05/22/2004   superificial- RIght upper back- (CX35FU)   Basal cell carcinoma 11/27/2004   beside R nostril-(MOHS), below right outer nose (MOHS)   Basal cell carcinoma 11/08/2009   left post shoulder -(txpbx)   Basal cell carcinoma 05/24/2012   ulcerated- Left post shoulder- (CX35FU), ulcerated-mid forehead (EXC )   Basal cell carcinoma 06/24/2012   superificial- Right shoulder - (txpbx)    Basal cell carcinoma 07/29/2012    sclerosis- mid forehead (MOHS)   Basal cell carcinoma 06/06/2019   nod-right anterior neck (CX35FU)   Cataract    Constipation    uses OTC meds with help   COPD (chronic obstructive pulmonary disease) (HCC)    Coronary artery disease    minimal nonobstructive    COVID    Depression    GERD (gastroesophageal reflux disease)    Heart murmur    Hx of adenomatous polyp of colon 10/03/2014   Hypercholesteremia    Hypertension    Panic attacks    Perimenopausal    Skin cancer  Squamous cell carcinoma of skin 06/05/2003   Right Temple(CX35FU)   Squamous cell carcinoma of skin 05/22/2004   in situ- Left upperarm (CX35FU)   Squamous cell carcinoma of skin 11/27/2004   in situ- above Left elbow (CX35FU)   Squamous cell carcinoma of skin 03/04/2006   in situ- Left sideburn (CX35FU)   Squamous cell carcinoma of skin 11/26/2006   in situ- mid brow (Cx35FU)   Squamous cell carcinoma of skin 05/24/2012   in situ- Left chest- (CX35FU)   Squamous cell carcinoma of skin 04/05/2013   in situ- Left nose (Cx35FU)   Squamous cell carcinoma of skin 08/17/2013   well diff-above Left eye (txpbx)   Squamous cell carcinoma of skin 07/30/2016   in situ- right  chest sup (Cx35FU)   Squamous cell carcinoma of skin 05/12/2017   in situ- RIght shoulder (CX35FU), in situ- Left forehead (CX35FU)   Squamous cell carcinoma of skin 11/23/2018   in situ- Right mid shin, inner- (MOHS)   Tobacco user     Past Surgical History:  Procedure Laterality Date   ABDOMINAL HYSTERECTOMY     CATARACT EXTRACTION W/PHACO Right 03/01/2021   Procedure: CATARACT EXTRACTION PHACO AND INTRAOCULAR LENS PLACEMENT (IOC);  Surgeon: Baruch Goldmann, MD;  Location: [facility];  Service: Ophthalmology;  Laterality: Right;  CDE: 7.31   COLONOSCOPY     IR VERTEBROPLASTY CERV/THOR BX INC UNI/BIL INC/INJECT/IMAGING  04/16/2021   Right foot toe surgery     UPPER GASTROINTESTINAL ENDOSCOPY     vocal cord     polyp removal    Family History  Problem Relation Age of Onset   Heart disease Mother    Heart attack Mother    Heart attack Father    Heart attack Brother    Colon cancer Brother 40       died at age 81   Anxiety disorder Brother    Depression Brother    Alcohol abuse Brother    Colon polyps Brother    Colon polyps Brother    Colon polyps Sister    Esophageal cancer Sister    Anxiety disorder Sister    Depression Sister    Colon polyps Sister    Drug abuse Son    Rectal cancer Neg Hx    Stomach cancer Neg Hx    Social History   Tobacco Use   Smoking status: Every Day    Packs/day: 1.00    Years: 40.00    Pack years: 40.00    Types: Cigarettes    Start date: 05/01/1969   Smokeless tobacco: Never  Vaping Use   Vaping Use: Never used  Substance Use Topics   Alcohol use: No    Alcohol/week: 0.0 standard drinks   Drug use: No    Comment: per pt, Cocaine was in her system October and November 2016 when she went to the pain clinic 04-02-15.    Allergies  Allergen Reactions   Lorcet 10-650 [Hydrocodone-Acetaminophen] Itching   Sulfa Antibiotics Other (See Comments)    aching   Tape Other (See Comments)    Tear skin. Please use paper tape   Wellbutrin Xl  [Bupropion Hcl Er (Xl)]     Upset stomach /bad taste in mouth    Current Meds  Medication Sig   acetaminophen (TYLENOL) 500 MG tablet Take 1,000 mg by mouth every 6 (six) hours as needed for moderate pain.   albuterol (VENTOLIN HFA) 108 (90 Base) MCG/ACT inhaler Inhale 2 puffs into the lungs every  6 (six) hours as needed for wheezing or shortness of breath.   amLODipine (NORVASC) 10 MG tablet TAKE ONE TABLET BY MOUTH DAILY   Ascorbic Acid (VITAMIN C) 1000 MG tablet Take 1,000 mg by mouth daily.   atorvastatin (LIPITOR) 40 MG tablet Take 1 tablet (40 mg total) by mouth at bedtime.   budesonide-formoterol (SYMBICORT) 160-4.5 MCG/ACT inhaler INHALE TWO PUFFS INTO THE LUNGS TWICE DAILY   clonazePAM (KLONOPIN) 1 MG tablet Take 1 tablet (1 mg total) by mouth 3 (three) times daily as needed. for anxiety   cyclobenzaprine (FLEXERIL) 5 MG tablet Take 1 tablet (5 mg total) by mouth 3 (three) times daily as needed.   dexlansoprazole (DEXILANT) 60 MG capsule TAKE ONE CAPSULE BY MOUTH DAILY ON AN EMPTY STOMACH, DO NOT EAT FOR ONE HOUR   DULoxetine (CYMBALTA) 60 MG capsule Take 1 capsule (60 mg total) by mouth 2 (two) times daily.   fexofenadine (ALLEGRA) 180 MG tablet TAKE ONE TABLET BY MOUTH DAILY FOR ALLERGY SYMPTOMS (Patient taking differently: Take 180 mg by mouth daily.)   gabapentin (NEURONTIN) 600 MG tablet Take 600 mg by mouth 3 (three) times daily.   ipratropium-albuterol (DUONEB) 0.5-2.5 (3) MG/3ML SOLN Take 3 mLs by nebulization every 6 (six) hours as needed.   lidocaine (LIDODERM) 5 % Place 1 patch onto the skin daily. Remove & Discard patch within 12 hours or as directed by MD   lisinopril (ZESTRIL) 20 MG tablet Take 1 tablet (20 mg total) by mouth daily.   naproxen sodium (ALEVE) 220 MG tablet Take 220 mg by mouth 2 (two) times daily as needed (pain).   oxyCODONE (ROXICODONE) 15 MG immediate release tablet Take 15 mg by mouth 4 (four) times daily as needed.   SPIRIVA RESPIMAT 2.5 MCG/ACT AERS  INHALE TWO PUFFS INTO THE LUNGS DAILY   Vitamin D, Ergocalciferol, (DRISDOL) 1.25 MG (50000 UNIT) CAPS capsule Take 1 capsule (50,000 Units total) by mouth every 7 (seven) days. (Patient taking differently: Take 50,000 Units by mouth every 7 (seven) days. Sunday)    Objective: There were no vitals taken for this visit.  Physical Exam:  General: Alert and oriented. and No acute distress. Gait: Normal gait.  Evaluation of the left shoulder demonstrates no deformity.  No atrophy is appreciated.  160 degrees of forward flexion with some discomfort.  Pain in the empty can testing position.  External rotation or side to 45 degrees.  Internal rotation of lumbar spine, but this causes pain.  Fingers are warm and well-perfused.  IMAGING: I personally reviewed images previously obtained in clinic   X-ray left shoulder without acute injury.  Left shoulder MRI  IMPRESSION: 1. Distal supraspinatus and infraspinatus tendinosis with bursal-sided fraying. Focal, intermediate grade bursal-sided tear of the far anterior fibers of the supraspinatus tendon at the footprint. No significant muscle atrophy. 2. Mild subacromial-subdeltoid bursitis.  Mild AC joint arthropathy. 3. Degenerative posterosuperior labral tearing.    New Medications:  No orders of the defined types were placed in this encounter.     Mordecai Rasmussen, MD  04/29/2021 3:25 PM

## 2021-05-01 ENCOUNTER — Encounter: Payer: Self-pay | Admitting: Family Medicine

## 2021-05-01 ENCOUNTER — Ambulatory Visit (INDEPENDENT_AMBULATORY_CARE_PROVIDER_SITE_OTHER): Payer: Medicare Other

## 2021-05-01 ENCOUNTER — Ambulatory Visit (INDEPENDENT_AMBULATORY_CARE_PROVIDER_SITE_OTHER): Payer: Medicare Other | Admitting: Family Medicine

## 2021-05-01 VITALS — BP 126/78 | HR 98 | Temp 97.8°F | Ht 65.0 in | Wt 157.8 lb

## 2021-05-01 DIAGNOSIS — E782 Mixed hyperlipidemia: Secondary | ICD-10-CM | POA: Diagnosis not present

## 2021-05-01 DIAGNOSIS — Z780 Asymptomatic menopausal state: Secondary | ICD-10-CM | POA: Diagnosis not present

## 2021-05-01 DIAGNOSIS — S22050A Wedge compression fracture of T5-T6 vertebra, initial encounter for closed fracture: Secondary | ICD-10-CM | POA: Diagnosis not present

## 2021-05-01 DIAGNOSIS — I10 Essential (primary) hypertension: Secondary | ICD-10-CM | POA: Diagnosis not present

## 2021-05-01 DIAGNOSIS — Z23 Encounter for immunization: Secondary | ICD-10-CM

## 2021-05-01 DIAGNOSIS — J439 Emphysema, unspecified: Secondary | ICD-10-CM | POA: Diagnosis not present

## 2021-05-01 DIAGNOSIS — E785 Hyperlipidemia, unspecified: Secondary | ICD-10-CM

## 2021-05-01 MED ORDER — BUDESONIDE-FORMOTEROL FUMARATE 160-4.5 MCG/ACT IN AERO: INHALATION_SPRAY | RESPIRATORY_TRACT | 2 refills | Status: DC

## 2021-05-01 MED ORDER — ATORVASTATIN CALCIUM 40 MG PO TABS: 40.0000 mg | ORAL_TABLET | Freq: Every day | ORAL | 3 refills | Status: DC

## 2021-05-01 MED ORDER — PREDNISONE 10 MG PO TABS: ORAL_TABLET | ORAL | 0 refills | Status: DC

## 2021-05-01 MED ORDER — LISINOPRIL 20 MG PO TABS: 20.0000 mg | ORAL_TABLET | Freq: Every day | ORAL | 3 refills | Status: DC

## 2021-05-01 MED ORDER — PREDNISONE 10 MG PO TABS: 10.0000 mg | ORAL_TABLET | Freq: Every day | ORAL | 2 refills | Status: DC

## 2021-05-01 NOTE — Progress Notes (Signed)
Subjective:  Patient ID: [name], female    DOB: 04-25-1955  Age: 66 y.o. MRN: [id number]  CC: Medical Management of Chronic Issues   HPI [name] presents for back and rib pain. Going to pain clinic next week.  She had a T5 compression fracture with vertebroplasty performed about 2 weeks ago.  She is still having a lot of pain.  However she is a chronic pain patient and has to get her pain meds through her pain clinic.  She will be doing that next week as noted above.  Meanwhile she is also having some shortness of breath.  She cannot do anything.  She cannot walk back and forth from her bedroom to her bathroom to her living room without getting short of breath.  She continues to use her Symbicort and Spiriva inhalers as directed daily.  She also has DuoNeb to use.  She talk to her pulmonologist who had her do a CT scan and just told her to follow-up next year.  There is apparently no plan for visit anytime soon.  Patient in for follow-up of elevated cholesterol. Doing well without complaints on current medication. Denies side effects of statin including myalgia and arthralgia and nausea. Also in today for liver function testing. Currently no chest pain, shortness of breath or other cardiovascular related symptoms noted.   Follow-up of hypertension. Patient has no history of headache chest pain or shortness of breath or recent cough. Patient also denies symptoms of TIA such as numbness weakness lateralizing. Patient checks  blood pressure at home and has not had any elevated readings recently. Patient denies side effects from his medication. States taking it regularly.  Depression screen [facility] 2/9 05/01/2021 05/01/2021 04/08/2021  Decreased Interest 1 0 2  Down, Depressed, Hopeless 1 0 1  PHQ - 2 Score 2 0 3  Altered sleeping 1 - 2  Tired, decreased energy 1 - 2  Change in appetite 1 - 1  Feeling bad or failure about yourself  0 - 0  Trouble concentrating 1 - 1  Moving slowly or  fidgety/restless 1 - 0  Suicidal thoughts 0 - 0  PHQ-9 Score 7 - 9  Difficult doing work/chores Very difficult - Very difficult  Some encounter information is confidential and restricted. Go to Review Flowsheets activity to see all data.  Some recent data might be hidden    History Zaniah has a past medical history of Allergy, Arthritis, Atypical mole (06/05/2003), Atypical nevus (07/26/2003), Basal cell carcinoma (06/05/2003), Basal cell carcinoma (05/22/2004), Basal cell carcinoma (11/27/2004), Basal cell carcinoma (11/08/2009), Basal cell carcinoma (05/24/2012), Basal cell carcinoma (06/24/2012), Basal cell carcinoma (07/29/2012), Basal cell carcinoma (06/06/2019), Cataract, Constipation, COPD (chronic obstructive pulmonary disease) ([place]), Coronary artery disease, COVID, Depression, GERD (gastroesophageal reflux disease), Heart murmur, adenomatous polyp of colon (10/03/2014), Hypercholesteremia, Hypertension, Panic attacks, Perimenopausal, Skin cancer, Squamous cell carcinoma of skin (06/05/2003), Squamous cell carcinoma of skin (05/22/2004), Squamous cell carcinoma of skin (11/27/2004), Squamous cell carcinoma of skin (03/04/2006), Squamous cell carcinoma of skin (11/26/2006), Squamous cell carcinoma of skin (05/24/2012), Squamous cell carcinoma of skin (04/05/2013), Squamous cell carcinoma of skin (08/17/2013), Squamous cell carcinoma of skin (07/30/2016), Squamous cell carcinoma of skin (05/12/2017), Squamous cell carcinoma of skin (11/23/2018), and Tobacco user.   She has a past surgical history that includes Abdominal hysterectomy; vocal cord; Right foot toe surgery; Colonoscopy; Upper gastrointestinal endoscopy; Cataract extraction w/PHACO (Right, 03/01/2021); and IR VERTEBROPLASTY CERV/THOR BX INC UNI/BIL INC/INJECT/IMAGING (04/16/2021).   Her family  history includes Alcohol abuse in her brother; Anxiety disorder in her brother and sister; Colon cancer (age of onset: 81) in her brother; Colon  polyps in her brother, brother, sister, and sister; Depression in her brother and sister; Drug abuse in her son; Esophageal cancer in her sister; Heart attack in her brother, father, and mother; Heart disease in her mother.She reports that she has been smoking cigarettes. She started smoking about 52 years ago. She has a 40.00 pack-year smoking history. She has never used smokeless tobacco. She reports that she does not drink alcohol and does not use drugs.    ROS Review of Systems  Constitutional:  Positive for fatigue. Negative for fever.  HENT:  Negative for congestion, rhinorrhea and sore throat.   Respiratory:  Negative for cough and shortness of breath.   Cardiovascular:  Negative for chest pain and palpitations.  Gastrointestinal:  Negative for abdominal pain.  Musculoskeletal:  Positive for arthralgias and back pain. Negative for myalgias.  Neurological:  Positive for weakness.   Objective:  BP 126/78    Pulse 98    Temp 97.8 F (36.6 C)    Ht $R'5\' 5"'my$  (1.651 m)    Wt 157 lb 12.8 oz (71.6 kg)    SpO2 94%    BMI 26.26 kg/m   BP Readings from Last 3 Encounters:  05/01/21 126/78  04/16/21 (!) 109/56  04/08/21 108/71    Wt Readings from Last 3 Encounters:  05/01/21 157 lb 12.8 oz (71.6 kg)  04/16/21 158 lb (71.7 kg)  04/08/21 157 lb (71.2 kg)     Physical Exam Constitutional:      Appearance: She is well-developed. She is ill-appearing. She is not toxic-appearing.  HENT:     Head: Normocephalic and atraumatic.  Eyes:     Conjunctiva/sclera: Conjunctivae normal.     Pupils: Pupils are equal, round, and reactive to light.  Cardiovascular:     Rate and Rhythm: Normal rate and regular rhythm.     Heart sounds: Normal heart sounds. No murmur heard. Pulmonary:     Effort: Pulmonary effort is normal. No respiratory distress.     Breath sounds: Wheezing (with poor exchange) present. No rales.  Abdominal:     General: Bowel sounds are normal. There is no distension.      Palpations: Abdomen is soft.     Tenderness: There is no abdominal tenderness.  Musculoskeletal:        General: Tenderness (upper back) present.     Cervical back: Normal range of motion and neck supple.     Lumbar back: Spasms and tenderness present. No deformity. Decreased range of motion.  Skin:    General: Skin is warm and dry.  Neurological:     Mental Status: She is alert and oriented to person, place, and time.     Deep Tendon Reflexes: Reflexes are normal and symmetric.  Psychiatric:        Behavior: Behavior normal.        Thought Content: Thought content normal.      Assessment & Plan:   Khalani was seen today for medical management of chronic issues.  Diagnoses and all orders for this visit:  Pulmonary emphysema, unspecified emphysema type ([place]) -     budesonide-formoterol (SYMBICORT) 160-4.5 MCG/ACT inhaler; INHALE TWO PUFFS INTO THE LUNGS TWICE DAILY  Essential hypertension -     CBC with Differential/Platelet -     CMP14+EGFR -     lisinopril (ZESTRIL) 20 MG tablet; Take 1  tablet (20 mg total) by mouth daily.  Mixed hyperlipidemia -     CMP14+EGFR -     Lipid panel -     atorvastatin (LIPITOR) 40 MG tablet; Take 1 tablet (40 mg total) by mouth at bedtime.  Compression fracture of T5 vertebra, initial encounter ([place]) -     DG Bone Density; Future  Other orders -     Discontinue: predniSONE (DELTASONE) 10 MG tablet; Take 5 daily for 2 days followed by 4,3,2 and 1 for 2 days each. -     predniSONE (DELTASONE) 10 MG tablet; Take 1 tablet (10 mg total) by mouth daily with breakfast.       I have discontinued Ettamae M. Harton's dexlansoprazole and predniSONE. I am also having her start on predniSONE. Additionally, I am having her maintain her vitamin C, ipratropium-albuterol, oxyCODONE, albuterol, Vitamin D (Ergocalciferol), amLODipine, fexofenadine, lidocaine, cyclobenzaprine, gabapentin, acetaminophen, naproxen sodium, Spiriva Respimat, clonazePAM, DULoxetine,  atorvastatin, budesonide-formoterol, and lisinopril.  Allergies as of 05/01/2021       Reactions   Lorcet 10-650 [hydrocodone-acetaminophen] Itching   Sulfa Antibiotics Other (See Comments)   aching   Tape Other (See Comments)   Tear skin. Please use paper tape   Wellbutrin Xl [bupropion Hcl Er (xl)]    Upset stomach /bad taste in mouth        Medication List        Accurate as of May 01, 2021  3:04 PM. If you have any questions, ask your nurse or doctor.          STOP taking these medications    dexlansoprazole 60 MG capsule Commonly known as: DEXILANT Stopped by: Claretta Fraise, MD       TAKE these medications    acetaminophen 500 MG tablet Commonly known as: TYLENOL Take 1,000 mg by mouth every 6 (six) hours as needed for moderate pain.   albuterol 108 (90 Base) MCG/ACT inhaler Commonly known as: VENTOLIN HFA Inhale 2 puffs into the lungs every 6 (six) hours as needed for wheezing or shortness of breath.   amLODipine 10 MG tablet Commonly known as: NORVASC TAKE ONE TABLET BY MOUTH DAILY   atorvastatin 40 MG tablet Commonly known as: LIPITOR Take 1 tablet (40 mg total) by mouth at bedtime.   budesonide-formoterol 160-4.5 MCG/ACT inhaler Commonly known as: Symbicort INHALE TWO PUFFS INTO THE LUNGS TWICE DAILY   clonazePAM 1 MG tablet Commonly known as: KLONOPIN Take 1 tablet (1 mg total) by mouth 3 (three) times daily as needed. for anxiety   cyclobenzaprine 5 MG tablet Commonly known as: FLEXERIL Take 1 tablet (5 mg total) by mouth 3 (three) times daily as needed.   DULoxetine 60 MG capsule Commonly known as: CYMBALTA Take 1 capsule (60 mg total) by mouth 2 (two) times daily.   fexofenadine 180 MG tablet Commonly known as: ALLEGRA TAKE ONE TABLET BY MOUTH DAILY FOR ALLERGY SYMPTOMS What changed: See the new instructions.   gabapentin 600 MG tablet Commonly known as: NEURONTIN Take 600 mg by mouth 3 (three) times daily.    ipratropium-albuterol 0.5-2.5 (3) MG/3ML Soln Commonly known as: DUONEB Take 3 mLs by nebulization every 6 (six) hours as needed.   lidocaine 5 % Commonly known as: Lidoderm Place 1 patch onto the skin daily. Remove & Discard patch within 12 hours or as directed by MD   lisinopril 20 MG tablet Commonly known as: ZESTRIL Take 1 tablet (20 mg total) by mouth daily.   naproxen sodium 220 MG  tablet Commonly known as: ALEVE Take 220 mg by mouth 2 (two) times daily as needed (pain).   oxyCODONE 15 MG immediate release tablet Commonly known as: ROXICODONE Take 15 mg by mouth 4 (four) times daily as needed.   predniSONE 10 MG tablet Commonly known as: DELTASONE Take 1 tablet (10 mg total) by mouth daily with breakfast. Started by: Claretta Fraise, MD   Spiriva Respimat 2.5 MCG/ACT Aers Generic drug: Tiotropium Bromide Monohydrate INHALE TWO PUFFS INTO THE LUNGS DAILY   vitamin C 1000 MG tablet Take 1,000 mg by mouth daily.   Vitamin D (Ergocalciferol) 1.25 MG (50000 UNIT) Caps capsule Commonly known as: DRISDOL Take 1 capsule (50,000 Units total) by mouth every 7 (seven) days. What changed: additional instructions         Follow-up: Return in about 6 weeks (around 06/12/2021).  Claretta Fraise, M.D.

## 2021-05-01 NOTE — Addendum Note (Signed)
Addended by: Baldomero Lamy B on: 05/01/2021 04:54 PM ? ? Modules accepted: Orders ? ?

## 2021-05-02 LAB — CBC WITH DIFFERENTIAL/PLATELET
Basophils Absolute: 0.1 10*3/uL (ref 0.0–0.2)
Basos: 1 %
EOS (ABSOLUTE): 0.1 10*3/uL (ref 0.0–0.4)
Eos: 1 %
Hematocrit: 41.5 % (ref 34.0–46.6)
Hemoglobin: 13.5 g/dL (ref 11.1–15.9)
Immature Grans (Abs): 0.2 10*3/uL — ABNORMAL HIGH (ref 0.0–0.1)
Immature Granulocytes: 2 %
Lymphocytes Absolute: 1.9 10*3/uL (ref 0.7–3.1)
Lymphs: 16 %
MCH: 29.4 pg (ref 26.6–33.0)
MCHC: 32.5 g/dL (ref 31.5–35.7)
MCV: 90 fL (ref 79–97)
Monocytes Absolute: 0.9 10*3/uL (ref 0.1–0.9)
Monocytes: 8 %
Neutrophils Absolute: 9.2 10*3/uL — ABNORMAL HIGH (ref 1.4–7.0)
Neutrophils: 72 %
Platelets: 387 10*3/uL (ref 150–450)
RBC: 4.59 x10E6/uL (ref 3.77–5.28)
RDW: 14.9 % (ref 11.7–15.4)
WBC: 12.4 10*3/uL — ABNORMAL HIGH (ref 3.4–10.8)

## 2021-05-02 LAB — CMP14+EGFR
ALT: 16 IU/L (ref 0–32)
AST: 13 IU/L (ref 0–40)
Albumin/Globulin Ratio: 2.3 — ABNORMAL HIGH (ref 1.2–2.2)
Albumin: 4.3 g/dL (ref 3.8–4.8)
Alkaline Phosphatase: 104 IU/L (ref 44–121)
BUN/Creatinine Ratio: 18 (ref 12–28)
BUN: 11 mg/dL (ref 8–27)
Bilirubin Total: <0.2 mg/dL (ref 0.0–1.2)
CO2: 27 mmol/L (ref 20–29)
Calcium: 9.4 mg/dL (ref 8.7–10.3)
Chloride: 103 mmol/L (ref 96–106)
Creatinine, Ser: 0.61 mg/dL (ref 0.57–1.00)
Globulin, Total: 1.9 g/dL (ref 1.5–4.5)
Glucose: 98 mg/dL (ref 70–99)
Potassium: 3.8 mmol/L (ref 3.5–5.2)
Sodium: 145 mmol/L — ABNORMAL HIGH (ref 134–144)
Total Protein: 6.2 g/dL (ref 6.0–8.5)
eGFR: 98 mL/min/{1.73_m2} (ref >59)

## 2021-05-02 LAB — LIPID PANEL
Chol/HDL Ratio: 4.3 ratio (ref 0.0–4.4)
Cholesterol, Total: 179 mg/dL (ref 100–199)
HDL: 42 mg/dL (ref >39)
LDL Chol Calc (NIH): 98 mg/dL (ref 0–99)
Triglycerides: 231 mg/dL — ABNORMAL HIGH (ref 0–149)
VLDL Cholesterol Cal: 39 mg/dL (ref 5–40)

## 2021-05-02 NOTE — Progress Notes (Signed)
Hello Laken,  Your lab result is normal and/or stable.Some minor variations that are not significant are commonly marked abnormal, but do not represent any medical problem for you.  Best regards, Jakeline Dave, M.D.

## 2021-05-03 DIAGNOSIS — M810 Age-related osteoporosis without current pathological fracture: Secondary | ICD-10-CM | POA: Diagnosis not present

## 2021-05-03 DIAGNOSIS — Z780 Asymptomatic menopausal state: Secondary | ICD-10-CM | POA: Diagnosis not present

## 2021-05-05 NOTE — Progress Notes (Signed)
DEXA shows osteoporosis. I recommend weekly fosamax. Nurse, if pt. Is agreeable, send in Fosamax 70 mg weekly, #13.  Thanks, WS 

## 2021-05-08 NOTE — H&P (Signed)
Surgical History & Physical ? ?Patient Name: [name] DOB: [birth year] ? ?Surgery: Cataract extraction with intraocular lens implant phacoemulsification; Left Eye ? ?Surgeon: Baruch Goldmann MD ?Surgery Date:  05-16-21 ?Pre-Op Date:  05-06-21 ? ?HPI: ?A 40 Yr. old female patient The patient is returning after cataract surgery. The right eye is affected. Status post cataract surgery, which began 13 days ago: 03/01/21. Since the last visit, the affected area is doing well. The patient's vision is improved but fluctuating. Patient is following medication instructions. Pt taking Ilevro qday OD and Pred BID ODPt denies any eye pain or increase in floaters/flashes of light. The patient complains of difficulty when Blurry vision, which began 1 year ago. The left eye is affected. The episode is gradual. The condition's severity increased since last visit. Symptoms occur when the patient is driving, inside and outside. This is negatively affecting the patient's quality of life and the patient is unable to function adequately in life with the current level of vision. HPI was performed by Baruch Goldmann . ? ?Medical History: ?Cataracts ?Aponeuritic Ptosis, Macular drusen, Peripheral Retinal degeneration, Hypertensive retinopathy ? ?Review of Systems ?Arthritis ?High Blood Pressure ?LDL ?Lung Problems ?Sinusitis, Headache, Degenerative disc Dis, ? ?Allergic/Immunologic Seasonal Allergies ?All recorded systems are negative except as noted above. ? ?Social ?  Current every day smoker  ? ?Medication ?Prednisolone, Ilevro,  ?Albuterol, Amlodipine, Atorvastatin, Clonazepam, Clonidine, Duloxetine, Oxycodone/acet, Symbicort Inhalant Product,  ? ?Sx/Procedures ?Phaco c IOL OD,  ?Hysterectomy partial,  ?Drug Allergies  ?Sulfa, Adhesive tape,  ? ?History & Physical: ?Heent: Cataract, Left Eye ?NECK: supple without bruits ?LUNGS: lungs clear to auscultation ?CV: regular rate and rhythm ?Abdomen: soft and non-tender ? ?Impression &  Plan: ?Assessment: ?1.  CATARACT EXTRACTION STATUS; Right Eye (Z98.41) ?2.  NUCLEAR SCLEROSIS AGE RELATED; , Left Eye (H25.12) ?3.  Myopia ; Right Eye (H52.11) ?4.  Presbyopia ; Both Eyes (H52.4) ? ?Plan: 1.  1 week after cataract surgery. Doing well with improved vision and normal eye pressure. Call with any problems or concerns. ?Stop Vigamox. Continue Ilevro 1 drop 1x/day for 3 more weeks. Continue Pred Acetate 1 drop 2x/day for 3 more weeks. ? ?2.  Cataract accounts for the patient's decreased vision. This visual impairment is not correctable with a tolerable change in glasses or contact lenses. Cataract surgery with an implantation of a new lens should significantly improve the visual and functional status of the patient. Discussed all risks, benefits, alternatives, and potential complications. Discussed the procedures and recovery. Patient desires to have surgery. A-scan ordered and performed today for intra-ocular lens calculations. The surgery will be performed in order to improve vision for driving, reading, and for eye examinations. Recommend phacoemulsification with intra-ocular lens. Recommend Dextenza for post-operative pain and inflammation. ?Left Eye. ?Surgery required to correct imbalance of vision. ?Dilates well - shugarcaine by protocol. ? ?3.  ? ?4.  ?

## 2021-05-09 ENCOUNTER — Encounter (HOSPITAL_COMMUNITY): Payer: Self-pay

## 2021-05-09 ENCOUNTER — Other Ambulatory Visit: Payer: Self-pay | Admitting: Family Medicine

## 2021-05-09 ENCOUNTER — Encounter (HOSPITAL_COMMUNITY)
Admission: RE | Admit: 2021-05-09 | Discharge: 2021-05-09 | Disposition: A | Discharge status: Home / Self Care | Payer: Medicare Other | Source: Ambulatory Visit | Attending: Ophthalmology | Admitting: Ophthalmology

## 2021-05-09 DIAGNOSIS — H2512 Age-related nuclear cataract, left eye: Secondary | ICD-10-CM | POA: Diagnosis not present

## 2021-05-09 DIAGNOSIS — G43909 Migraine, unspecified, not intractable, without status migrainosus: Secondary | ICD-10-CM | POA: Diagnosis not present

## 2021-05-09 DIAGNOSIS — M545 Low back pain, unspecified: Secondary | ICD-10-CM | POA: Diagnosis not present

## 2021-05-09 DIAGNOSIS — M542 Cervicalgia: Secondary | ICD-10-CM | POA: Diagnosis not present

## 2021-05-09 HISTORY — DX: Wedge compression fracture of fifth lumbar vertebra, initial encounter for closed fracture: S32.050A

## 2021-05-13 ENCOUNTER — Other Ambulatory Visit: Payer: Self-pay | Admitting: Family Medicine

## 2021-05-16 ENCOUNTER — Ambulatory Visit (HOSPITAL_BASED_OUTPATIENT_CLINIC_OR_DEPARTMENT_OTHER): Payer: Medicare Other | Admitting: Anesthesiology

## 2021-05-16 ENCOUNTER — Ambulatory Visit (HOSPITAL_COMMUNITY)
Admission: RE | Admit: 2021-05-16 | Discharge: 2021-05-16 | Disposition: A | Discharge status: Home / Self Care | Payer: Medicare Other | Attending: Ophthalmology | Admitting: Ophthalmology

## 2021-05-16 ENCOUNTER — Encounter (HOSPITAL_COMMUNITY)
Admission: RE | Disposition: A | Discharge status: Home / Self Care | Payer: Self-pay | Source: Home / Self Care | Attending: Ophthalmology

## 2021-05-16 ENCOUNTER — Ambulatory Visit (HOSPITAL_COMMUNITY): Payer: Medicare Other | Admitting: Anesthesiology

## 2021-05-16 ENCOUNTER — Encounter (HOSPITAL_COMMUNITY): Payer: Self-pay | Admitting: Ophthalmology

## 2021-05-16 DIAGNOSIS — Z79899 Other long term (current) drug therapy: Secondary | ICD-10-CM | POA: Diagnosis not present

## 2021-05-16 DIAGNOSIS — F17210 Nicotine dependence, cigarettes, uncomplicated: Secondary | ICD-10-CM | POA: Diagnosis not present

## 2021-05-16 DIAGNOSIS — J449 Chronic obstructive pulmonary disease, unspecified: Secondary | ICD-10-CM | POA: Insufficient documentation

## 2021-05-16 DIAGNOSIS — Z7951 Long term (current) use of inhaled steroids: Secondary | ICD-10-CM | POA: Diagnosis not present

## 2021-05-16 DIAGNOSIS — I10 Essential (primary) hypertension: Secondary | ICD-10-CM

## 2021-05-16 DIAGNOSIS — H2512 Age-related nuclear cataract, left eye: Secondary | ICD-10-CM

## 2021-05-16 DIAGNOSIS — I2510 Atherosclerotic heart disease of native coronary artery without angina pectoris: Secondary | ICD-10-CM

## 2021-05-16 DIAGNOSIS — F17200 Nicotine dependence, unspecified, uncomplicated: Secondary | ICD-10-CM | POA: Insufficient documentation

## 2021-05-16 HISTORY — PX: CATARACT EXTRACTION W/PHACO: SHX586

## 2021-05-16 SURGERY — PHACOEMULSIFICATION, CATARACT, WITH IOL INSERTION
Anesthesia: Monitor Anesthesia Care | Site: Eye | Laterality: Left

## 2021-05-16 MED ORDER — LIDOCAINE HCL (PF) 1 % IJ SOLN
INTRAOCULAR | Status: DC | PRN
  Administered 2021-05-16: 1 mL via OPHTHALMIC

## 2021-05-16 MED ORDER — SODIUM HYALURONATE 23MG/ML IO SOSY
PREFILLED_SYRINGE | INTRAOCULAR | Status: DC | PRN
  Administered 2021-05-16: 0.6 mL via INTRAOCULAR

## 2021-05-16 MED ORDER — BSS IO SOLN
INTRAOCULAR | Status: DC | PRN
  Administered 2021-05-16: 15 mL via INTRAOCULAR

## 2021-05-16 MED ORDER — POVIDONE-IODINE 5 % OP SOLN
OPHTHALMIC | Status: DC | PRN
  Administered 2021-05-16: 1 via OPHTHALMIC

## 2021-05-16 MED ORDER — STERILE WATER FOR IRRIGATION IR SOLN
Status: DC | PRN
Start: 2021-05-16 — End: 2021-05-16
  Administered 2021-05-16: 500 mL

## 2021-05-16 MED ORDER — MIDAZOLAM HCL 2 MG/2ML IJ SOLN
INTRAMUSCULAR | Status: AC
  Filled 2021-05-16: qty 2

## 2021-05-16 MED ORDER — PHENYLEPHRINE HCL 2.5 % OP SOLN
1.0000 [drp] | OPHTHALMIC | Status: AC | PRN
  Administered 2021-05-16 (×3): 1 [drp] via OPHTHALMIC

## 2021-05-16 MED ORDER — LACTATED RINGERS IV SOLN: INTRAVENOUS | Status: DC

## 2021-05-16 MED ORDER — EPINEPHRINE PF 1 MG/ML IJ SOLN
INTRAOCULAR | Status: DC | PRN
  Administered 2021-05-16: 500 mL

## 2021-05-16 MED ORDER — TROPICAMIDE 1 % OP SOLN
1.0000 [drp] | OPHTHALMIC | Status: AC | PRN
  Administered 2021-05-16 (×3): 1 [drp] via OPHTHALMIC
  Filled 2021-05-16: qty 3

## 2021-05-16 MED ORDER — MIDAZOLAM HCL 2 MG/2ML IJ SOLN
INTRAMUSCULAR | Status: DC | PRN
Start: 2021-05-16 — End: 2021-05-16
  Administered 2021-05-16: 1 mg via INTRAVENOUS

## 2021-05-16 MED ORDER — SODIUM HYALURONATE 10 MG/ML IO SOLUTION
PREFILLED_SYRINGE | INTRAOCULAR | Status: DC | PRN
  Administered 2021-05-16: 0.85 mL via INTRAOCULAR

## 2021-05-16 MED ORDER — EPINEPHRINE PF 1 MG/ML IJ SOLN
INTRAMUSCULAR | Status: AC
  Filled 2021-05-16: qty 2

## 2021-05-16 MED ORDER — TETRACAINE HCL 0.5 % OP SOLN
1.0000 [drp] | OPHTHALMIC | Status: AC | PRN
  Administered 2021-05-16 (×3): 1 [drp] via OPHTHALMIC

## 2021-05-16 MED ORDER — LIDOCAINE HCL 3.5 % OP GEL
1.0000 "application " | Freq: Once | OPHTHALMIC | Status: AC
  Administered 2021-05-16: 1 via OPHTHALMIC

## 2021-05-16 SURGICAL SUPPLY — 14 items
CATARACT SUITE SIGHTPATH (MISCELLANEOUS) ×2 IMPLANT
CLOTH BEACON ORANGE TIMEOUT ST (SAFETY) ×3 IMPLANT
EYE SHIELD UNIVERSAL CLEAR (GAUZE/BANDAGES/DRESSINGS) ×1 IMPLANT
FEE CATARACT SUITE SIGHTPATH (MISCELLANEOUS) ×2 IMPLANT
GLOVE SURG UNDER POLY LF SZ6.5 (GLOVE) ×1 IMPLANT
LENS IOL RAYNER 23.5 (Intraocular Lens) ×2 IMPLANT
LENS IOL RAYONE EMV 23.5 (Intraocular Lens) IMPLANT
NDL HYPO 18GX1.5 BLUNT FILL (NEEDLE) ×2 IMPLANT
NEEDLE HYPO 18GX1.5 BLUNT FILL (NEEDLE) ×2 IMPLANT
PAD ARMBOARD 7.5X6 YLW CONV (MISCELLANEOUS) ×3 IMPLANT
SYR TB 1ML LL NO SAFETY (SYRINGE) ×3 IMPLANT
TAPE SURG TRANSPORE 1 IN (GAUZE/BANDAGES/DRESSINGS) IMPLANT
TAPE SURGICAL TRANSPORE 1 IN (GAUZE/BANDAGES/DRESSINGS) ×2
WATER STERILE IRR 250ML POUR (IV SOLUTION) ×3 IMPLANT

## 2021-05-16 NOTE — Anesthesia Postprocedure Evaluation (Signed)
Anesthesia Post Note ? ?Patient: [name] ? ?Procedure(s) Performed: CATARACT EXTRACTION PHACO AND INTRAOCULAR LENS PLACEMENT (IOC) (Left: Eye) ? ?Patient location during evaluation: Phase II ?Anesthesia Type: MAC ?Level of consciousness: awake ?Pain management: pain level controlled ?Vital Signs Assessment: post-procedure vital signs reviewed and stable ?Respiratory status: spontaneous breathing and respiratory function stable ?Cardiovascular status: blood pressure returned to baseline and stable ?Postop Assessment: no headache and no apparent nausea or vomiting ?Anesthetic complications: no ?Comments: Late entry ? ? ?No notable events documented. ? ? ?Last Vitals:  ?Vitals:  ? 05/16/21 1330 05/16/21 1335  ?BP:  (!) 141/106  ?Pulse: 98 93  ?Resp: 20 (!) 21  ?Temp: 36.8 ?C   ?SpO2: 99% 100%  ?  ?Last Pain:  ?Vitals:  ? 05/16/21 1330  ?PainSc: 0-No pain  ? ? ?  ?  ?  ?  ?  ?  ? ?Louann Sjogren ? ? ? ? ?

## 2021-05-16 NOTE — Anesthesia Preprocedure Evaluation (Signed)
Anesthesia Evaluation  ?Patient identified by MRN, date of birth, ID band ?Patient awake ? ? ? ?Reviewed: ?Allergy & Precautions, H&P , NPO status , Patient's Chart, lab work & pertinent test results, reviewed documented beta blocker date and time  ? ?Airway ?Mallampati: II ? ?TM Distance: >3 FB ?Neck ROM: full ? ? ? Dental ?no notable dental hx. ? ?  ?Pulmonary ?COPD, Current Smoker and Patient abstained from smoking.,  ?  ?Pulmonary exam normal ?breath sounds clear to auscultation ? ? ? ? ? ? Cardiovascular ?Exercise Tolerance: Good ?hypertension, + CAD  ? ?Rhythm:regular Rate:Normal ? ? ?  ?Neuro/Psych ?PSYCHIATRIC DISORDERS Anxiety Depression negative neurological ROS ?   ? GI/Hepatic ?Neg liver ROS, GERD  Medicated,  ?Endo/Other  ?negative endocrine ROS ? Renal/GU ?negative Renal ROS  ?negative genitourinary ?  ?Musculoskeletal ? ? Abdominal ?  ?Peds ? Hematology ?negative hematology ROS ?(+)   ?Anesthesia Other Findings ? ? Reproductive/Obstetrics ?negative OB ROS ? ?  ? ? ? ? ? ? ? ? ? ? ? ? ? ?  ?  ? ? ? ? ? ? ? ? ?Anesthesia Physical ? ?Anesthesia Plan ? ?ASA: 3 ? ?Anesthesia Plan: MAC  ? ?Post-op Pain Management:   ? ?Induction:  ? ?PONV Risk Score and Plan:  ? ?Airway Management Planned:  ? ?Additional Equipment:  ? ?Intra-op Plan:  ? ?Post-operative Plan:  ? ?Informed Consent: I have reviewed the patients History and Physical, chart, labs and discussed the procedure including the risks, benefits and alternatives for the proposed anesthesia with the patient or authorized representative who has indicated his/her understanding and acceptance.  ? ? ? ?Dental Advisory Given ? ?Plan Discussed with: CRNA ? ?Anesthesia Plan Comments:   ? ? ? ? ? ? ?Anesthesia Quick Evaluation ? ?

## 2021-05-16 NOTE — Discharge Instructions (Signed)
Please discharge patient when stable, will follow up today with Dr. Larenda Reedy at the [facility] [place] office immediately following discharge.  Leave shield in place until visit.  All paperwork with discharge instructions will be given at the office.  [facility] [place] Address:  [address]  [place], [place] [address]  

## 2021-05-16 NOTE — Transfer of Care (Signed)
Immediate Anesthesia Transfer of Care Note ? ?Patient: [name] ? ?Procedure(s) Performed: CATARACT EXTRACTION PHACO AND INTRAOCULAR LENS PLACEMENT (IOC) (Left: Eye) ? ?Patient Location: Short Stay ? ?Anesthesia Type:MAC ? ?Level of Consciousness: awake, alert , oriented and patient cooperative ? ?Airway & Oxygen Therapy: Patient Spontanous Breathing ? ?Post-op Assessment: Report given to RN, Post -op Vital signs reviewed and stable and Patient moving all extremities ? ?Post vital signs: Reviewed and stable ? ?Last Vitals:  ?Vitals Value Taken Time  ?BP    ?Temp    ?Pulse    ?Resp    ?SpO2    ? ? ?Last Pain:  ?Vitals:  ? 05/16/21 1330  ?PainSc: 0-No pain  ?   ? ?  ? ?Complications: No notable events documented. ?

## 2021-05-16 NOTE — Op Note (Signed)
Date of procedure: 05/16/21 ? ?Pre-operative diagnosis: Visually significant age-related nuclear cataract, Left Eye (H25.12) ? ?Post-operative diagnosis: Visually significant age-related nuclear cataract, Left Eye ? ?Procedure: Removal of cataract via phacoemulsification and insertion of intra-ocular lens Rayner [id number] +23.5D into the capsular bag of the Left Eye ? ?Attending surgeon: Gerda Diss. Marisa Hua, MD, MA ? ?Anesthesia: MAC, Topical [place] ? ?Complications: None ? ?Estimated Blood Loss: <107m (minimal) ? ?Specimens: None ? ?Implants: As above ? ?Indications:  Visually significant age-related cataract, Left Eye ? ?Procedure:  ?The patient was seen and identified in the pre-operative area. The operative eye was identified and dilated.  The operative eye was marked.  Topical anesthesia was administered to the operative eye.    ? ?The patient was then to the operative suite and placed in the supine position.  A timeout was performed confirming the patient, procedure to be performed, and all other relevant information.   The patient's face was prepped and draped in the usual fashion for intra-ocular surgery.  A lid speculum was placed into the operative eye and the surgical microscope moved into place and focused.  An inferotemporal paracentesis was created using a 20 gauge paracentesis blade.  Shugarcaine was injected into the anterior chamber.  Viscoelastic was injected into the anterior chamber.  A temporal clear-corneal main wound incision was created using a 2.48mmicrokeratome.  A continuous curvilinear capsulorrhexis was initiated using an irrigating cystitome and completed using capsulorrhexis forceps.  Hydrodissection and hydrodeliniation were performed.  Viscoelastic was injected into the anterior chamber.  A phacoemulsification handpiece and a chopper as a second instrument were used to remove the nucleus and epinucleus. The irrigation/aspiration handpiece was used to remove any remaining cortical material.   ? ?The capsular bag was reinflated with viscoelastic, checked, and found to be intact.  The intraocular lens was inserted into the capsular bag.  The irrigation/aspiration handpiece was used to remove any remaining viscoelastic.  The clear corneal wound and paracentesis wounds were then hydrated and checked with Weck-Cels to be watertight.  The lid-speculum and drape was removed, and the patient's face was cleaned with a wet and dry 4x4. A clear shield was taped over the eye. The patient was taken to the post-operative care unit in good condition, having tolerated the procedure well. ? ?Post-Op Instructions: [facility] and will receive all other orders and instructions. ? ?

## 2021-05-16 NOTE — Interval H&P Note (Signed)
History and Physical Interval Note: ? ?05/16/2021 ?1:47 PM ? ?[name]  has presented today for surgery, with the diagnosis of nuclear sclerosis age related cataract; left.  The various methods of treatment have been discussed with the patient and family. After consideration of risks, benefits and other options for treatment, the patient has consented to  Procedure(s) with comments: ?CATARACT EXTRACTION PHACO AND INTRAOCULAR LENS PLACEMENT (IOC) (Left) - left as a surgical intervention.  The patient's history has been reviewed, patient examined, no change in status, stable for surgery.  I have reviewed the patient's chart and labs.  Questions were answered to the patient's satisfaction.   ? ? ?Baruch Goldmann ? ? ?

## 2021-05-17 ENCOUNTER — Encounter (HOSPITAL_COMMUNITY): Payer: Self-pay | Admitting: Ophthalmology

## 2021-05-19 DIAGNOSIS — M6281 Muscle weakness (generalized): Secondary | ICD-10-CM | POA: Diagnosis not present

## 2021-05-23 DIAGNOSIS — H2512 Age-related nuclear cataract, left eye: Secondary | ICD-10-CM | POA: Diagnosis not present

## 2021-05-27 ENCOUNTER — Other Ambulatory Visit: Payer: Self-pay | Admitting: Family Medicine

## 2021-05-27 ENCOUNTER — Telehealth: Payer: Self-pay | Admitting: Family Medicine

## 2021-05-27 MED ORDER — DEXLANSOPRAZOLE 60 MG PO CPDR: DELAYED_RELEASE_CAPSULE | ORAL | 3 refills | Status: DC

## 2021-05-27 NOTE — Telephone Encounter (Signed)
Refills sent to [facility] ?

## 2021-05-27 NOTE — Telephone Encounter (Signed)
Per pharmacist, ? ?Pt is requesting refill on her Dexlansoprazole Rx. Explained to pharmacist that we received the request but it was denied for refills because Dr Livia Snellen d/c the medicine at pts last visit on 05/01/21. Says pt was unaware of this and does not want to come off of the medicine. ?

## 2021-05-29 ENCOUNTER — Telehealth (HOSPITAL_COMMUNITY): Payer: Medicare Other | Admitting: Psychiatry

## 2021-06-05 ENCOUNTER — Inpatient Hospital Stay: Admission: RE | Admit: 2021-06-05 | Payer: Medicare Other | Source: Ambulatory Visit

## 2021-06-10 ENCOUNTER — Ambulatory Visit: Payer: Medicare Other | Admitting: Orthopedic Surgery

## 2021-06-17 ENCOUNTER — Encounter: Payer: Self-pay | Admitting: Family Medicine

## 2021-06-17 ENCOUNTER — Ambulatory Visit (INDEPENDENT_AMBULATORY_CARE_PROVIDER_SITE_OTHER): Payer: Medicare Other | Admitting: Family Medicine

## 2021-06-17 VITALS — BP 119/69 | HR 81 | Temp 98.1°F | Ht 65.0 in | Wt 158.8 lb

## 2021-06-17 DIAGNOSIS — M12819 Other specific arthropathies, not elsewhere classified, unspecified shoulder: Secondary | ICD-10-CM

## 2021-06-17 DIAGNOSIS — R251 Tremor, unspecified: Secondary | ICD-10-CM

## 2021-06-17 MED ORDER — ROPINIROLE HCL 1 MG PO TABS: 1.0000 mg | ORAL_TABLET | Freq: Every day | ORAL | 5 refills | Status: DC

## 2021-06-17 NOTE — Progress Notes (Signed)
? ?Subjective:  ?Patient ID: [name], female    DOB: Oct 27, 1955  Age: 66 y.o. MRN: [id number] ? ?CC: Results and Referral ? ? ?HPI ?[name] presents for a lot of pain in left shoulder, goes from neck all the way to hand. "Elbow is killing me." Using aspercreme and diclofenac cream. Not helping.  She is here to discuss the MRI that was done 4 months ago.  She hurt her back shortly after that and has not been able to pursue the shoulder issue.  However at this time she is having severe pain radiating from the posterior shoulder down to the back of the elbow.  It also radiates up to her neck.  She has been told in the past that she had bad arthritis in her spine.  She would also like to see an orthopedist. ? ?She has a lot of uncontrollable tremor.  This happens much primarily through the day it seems to stop when she lays down, but is worse when she is sitting or standing ? ? ?  05/01/2021  ?  2:21 PM 05/01/2021  ?  2:11 PM 04/23/2021  ?  2:11 PM  ?Depression screen PHQ 2/9  ?Decreased Interest 1 0   ?Down, Depressed, Hopeless 1 0   ?PHQ - 2 Score 2 0   ?Altered sleeping 1    ?Tired, decreased energy 1    ?Change in appetite 1    ?Feeling bad or failure about yourself  0    ?Trouble concentrating 1    ?Moving slowly or fidgety/restless 1    ?Suicidal thoughts 0    ?PHQ-9 Score 7    ?Difficult doing work/chores Very difficult    ?  ? Information is confidential and restricted. Go to Review Flowsheets to unlock data.  ? ? ?History ?[name] has a past medical history of Allergy, Arthritis, Atypical mole (06/05/2003), Atypical nevus (07/26/2003), Basal cell carcinoma (06/05/2003), Basal cell carcinoma (05/22/2004), Basal cell carcinoma (11/27/2004), Basal cell carcinoma (11/08/2009), Basal cell carcinoma (05/24/2012), Basal cell carcinoma (06/24/2012), Basal cell carcinoma (07/29/2012), Basal cell carcinoma (06/06/2019), Cataract, Closed compression fracture of L5 vertebra ([place]), Constipation, COPD (chronic  obstructive pulmonary disease) ([place]), Coronary artery disease, COVID, Depression, GERD (gastroesophageal reflux disease), Heart murmur, adenomatous polyp of colon (10/03/2014), Hypercholesteremia, Hypertension, Panic attacks, Perimenopausal, Skin cancer, Squamous cell carcinoma of skin (06/05/2003), Squamous cell carcinoma of skin (05/22/2004), Squamous cell carcinoma of skin (11/27/2004), Squamous cell carcinoma of skin (03/04/2006), Squamous cell carcinoma of skin (11/26/2006), Squamous cell carcinoma of skin (05/24/2012), Squamous cell carcinoma of skin (04/05/2013), Squamous cell carcinoma of skin (08/17/2013), Squamous cell carcinoma of skin (07/30/2016), Squamous cell carcinoma of skin (05/12/2017), Squamous cell carcinoma of skin (11/23/2018), and Tobacco user.  ? ?She has a past surgical history that includes Abdominal hysterectomy; vocal cord; Right foot toe surgery; Colonoscopy; Upper gastrointestinal endoscopy; Cataract extraction w/PHACO (Right, 03/01/2021); IR VERTEBROPLASTY CERV/THOR BX INC UNI/BIL INC/INJECT/IMAGING (04/16/2021); and Cataract extraction w/PHACO (Left, 05/16/2021).  ? ?Her family history includes Alcohol abuse in her brother; Anxiety disorder in her brother and sister; Colon cancer (age of onset: 4) in her brother; Colon polyps in her brother, brother, sister, and sister; Depression in her brother and sister; Drug abuse in her son; Esophageal cancer in her sister; Heart attack in her brother, father, and mother; Heart disease in her mother.She reports that she has been smoking cigarettes. She started smoking about 52 years ago. She has a 40.00 pack-year smoking history. She has never used smokeless tobacco.  She reports that she does not drink alcohol and does not use drugs. ? ? ? ?ROS ?Review of Systems  ?Constitutional: Negative.   ?HENT: Negative.    ?Eyes:  Negative for visual disturbance.  ?Respiratory:  Negative for shortness of breath.   ?Cardiovascular:  Negative for chest pain.   ?Gastrointestinal:  Negative for abdominal pain.  ?Musculoskeletal:  Positive for arthralgias and back pain.  ?Neurological:  Positive for tremors.  ? ?Objective:  ?BP 119/69   Pulse 81   Temp 98.1 ?F (36.7 ?C)   Ht '5\' 5"'$  (1.651 m)   Wt 158 lb 12.8 oz (72 kg)   SpO2 96%   BMI 26.43 kg/m?  ? ?BP Readings from Last 3 Encounters:  ?06/17/21 119/69  ?05/16/21 133/90  ?05/01/21 126/78  ? ? ?Wt Readings from Last 3 Encounters:  ?06/17/21 158 lb 12.8 oz (72 kg)  ?05/01/21 157 lb 12.8 oz (71.6 kg)  ?04/16/21 158 lb (71.7 kg)  ? ? ? ?Physical Exam ?Constitutional:   ?   General: She is not in acute distress. ?   Appearance: She is well-developed.  ?Cardiovascular:  ?   Rate and Rhythm: Normal rate and regular rhythm.  ?Pulmonary:  ?   Breath sounds: Normal breath sounds.  ?Musculoskeletal:     ?   General: Normal range of motion.  ?Skin: ?   General: Skin is warm and dry.  ?Neurological:  ?   Mental Status: She is alert and oriented to person, place, and time.  ?   Coordination: Coordination abnormal (There is a gross tremor noted in the legs for flexion at the hips).  ? ? ? ? ?Assessment & Plan:  ? ?[name] was seen today for results and referral. ? ?Diagnoses and all orders for this visit: ? ?Rotator cuff arthropathy, unspecified laterality ?-     Ambulatory referral to Orthopedics ? ?Tremor ? ?Other orders ?-     rOPINIRole (REQUIP) 1 MG tablet; Take 1 tablet (1 mg total) by mouth at bedtime. For leg cramps ? ? ? ? ? ? ?I am having [name] start on rOPINIRole. I am also having her maintain her vitamin C, ipratropium-albuterol, oxyCODONE, albuterol, Vitamin D (Ergocalciferol), amLODipine, fexofenadine, lidocaine, cyclobenzaprine, gabapentin, acetaminophen, naproxen sodium, Spiriva Respimat, clonazePAM, DULoxetine, atorvastatin, budesonide-formoterol, lisinopril, predniSONE, and dexlansoprazole. ? ?Allergies as of 06/17/2021   ? ?   Reactions  ? Lorcet 10-650 [hydrocodone-acetaminophen] Itching  ? Sulfa  Antibiotics Other (See Comments)  ? aching  ? Tape Other (See Comments)  ? Tear skin. Please use paper tape  ? Wellbutrin Xl [bupropion Hcl Er (xl)]   ? Upset stomach /bad taste in mouth  ? ?  ? ?  ?Medication List  ?  ? ?  ? Accurate as of June 17, 2021 12:20 PM. If you have any questions, ask your nurse or doctor.  ?  ?  ? ?  ? ?acetaminophen 500 MG tablet ?Commonly known as: TYLENOL ?Take 1,000 mg by mouth every 6 (six) hours as needed for moderate pain. ?  ?albuterol 108 (90 Base) MCG/ACT inhaler ?Commonly known as: VENTOLIN HFA ?Inhale 2 puffs into the lungs every 6 (six) hours as needed for wheezing or shortness of breath. ?  ?amLODipine 10 MG tablet ?Commonly known as: NORVASC ?TAKE ONE TABLET BY MOUTH DAILY ?  ?atorvastatin 40 MG tablet ?Commonly known as: LIPITOR ?Take 1 tablet (40 mg total) by mouth at bedtime. ?  ?budesonide-formoterol 160-4.5 MCG/ACT inhaler ?Commonly known as: Symbicort ?INHALE  TWO PUFFS INTO THE LUNGS TWICE DAILY ?  ?clonazePAM 1 MG tablet ?Commonly known as: KLONOPIN ?Take 1 tablet (1 mg total) by mouth 3 (three) times daily as needed. for anxiety ?  ?cyclobenzaprine 5 MG tablet ?Commonly known as: FLEXERIL ?Take 1 tablet (5 mg total) by mouth 3 (three) times daily as needed. ?  ?dexlansoprazole 60 MG capsule ?Commonly known as: Dexilant ?TAKE ONE CAPSULE BY MOUTH DAILY ON EMPTY STOMACH, THEN DO NOT EAT FOR ONE HOUR. (REPLACES PANTROPRAZOLE FOR REFLUX) ?  ?DULoxetine 60 MG capsule ?Commonly known as: CYMBALTA ?Take 1 capsule (60 mg total) by mouth 2 (two) times daily. ?  ?fexofenadine 180 MG tablet ?Commonly known as: ALLEGRA ?TAKE ONE TABLET BY MOUTH DAILY FOR ALLERGY SYMPTOMS ?What changed: See the new instructions. ?  ?gabapentin 600 MG tablet ?Commonly known as: NEURONTIN ?Take 600 mg by mouth 3 (three) times daily. ?  ?ipratropium-albuterol 0.5-2.5 (3) MG/3ML Soln ?Commonly known as: DUONEB ?Take 3 mLs by nebulization every 6 (six) hours as needed. ?  ?lidocaine 5 % ?Commonly  known as: Lidoderm ?Place 1 patch onto the skin daily. Remove & Discard patch within 12 hours or as directed by MD ?  ?lisinopril 20 MG tablet ?Commonly known as: ZESTRIL ?Take 1 tablet (20 mg total) by mouth daily. ?  ?napr

## 2021-06-19 DIAGNOSIS — M6281 Muscle weakness (generalized): Secondary | ICD-10-CM | POA: Diagnosis not present

## 2021-06-20 ENCOUNTER — Ambulatory Visit (INDEPENDENT_AMBULATORY_CARE_PROVIDER_SITE_OTHER): Payer: Medicare Other | Admitting: Dermatology

## 2021-06-20 ENCOUNTER — Encounter: Payer: Self-pay | Admitting: Dermatology

## 2021-06-20 DIAGNOSIS — D0430 Carcinoma in situ of skin of unspecified part of face: Secondary | ICD-10-CM

## 2021-06-20 DIAGNOSIS — D0439 Carcinoma in situ of skin of other parts of face: Secondary | ICD-10-CM | POA: Diagnosis not present

## 2021-06-20 NOTE — Patient Instructions (Signed)

## 2021-06-25 ENCOUNTER — Telehealth: Payer: Self-pay | Admitting: Dermatology

## 2021-06-25 NOTE — Telephone Encounter (Signed)
[facility] in [place] has no record of her having been referred there. She says she would prefer [place] and the same doctor she saw there before. ?

## 2021-06-25 NOTE — Telephone Encounter (Signed)
Patient has had the mohs on the neck sup inf last year 12/24/20 with Dr Link Snuffer, she is to follow up with Dr Denna Haggard 5/23 for leg surgery ?

## 2021-07-04 DIAGNOSIS — G43909 Migraine, unspecified, not intractable, without status migrainosus: Secondary | ICD-10-CM | POA: Diagnosis not present

## 2021-07-04 DIAGNOSIS — G603 Idiopathic progressive neuropathy: Secondary | ICD-10-CM | POA: Diagnosis not present

## 2021-07-04 DIAGNOSIS — Z79899 Other long term (current) drug therapy: Secondary | ICD-10-CM | POA: Diagnosis not present

## 2021-07-04 DIAGNOSIS — M5417 Radiculopathy, lumbosacral region: Secondary | ICD-10-CM | POA: Diagnosis not present

## 2021-07-04 DIAGNOSIS — M5412 Radiculopathy, cervical region: Secondary | ICD-10-CM | POA: Diagnosis not present

## 2021-07-04 DIAGNOSIS — G2581 Restless legs syndrome: Secondary | ICD-10-CM | POA: Diagnosis not present

## 2021-07-08 ENCOUNTER — Encounter: Payer: Self-pay | Admitting: Dermatology

## 2021-07-08 NOTE — Progress Notes (Signed)
? ?  Follow-Up Visit ?  ?Subjective  ?[name] is a 66 y.o. female who presents for the following: Procedure (Patient here today for treatment of CIS x 2 ). ? ?Biopsy-proven CIS x2 on face ?Location:  ?Duration:  ?Quality:  ?Associated Signs/Symptoms: ?Modifying Factors:  ?Severity:  ?Timing: ?Context:  ? ?Objective  ?Well appearing patient in no apparent distress; mood and affect are within normal limits. ?Right Buccal Cheek ?Biopsy site identified by patient, nurse, and me ? ?Left inner cheek ?Biopsy site identified by patient, nurse, me ? ? ? ?A focused examination was performed including head and neck. Relevant physical exam findings are noted in the Assessment and Plan. ? ? ?Assessment & Plan  ? ? 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 ?Destruction of lesion ?Complexity: simple   ?Destruction method: electrodesiccation and curettage   ?Informed consent: discussed and consent obtained   ?Timeout:  patient name, date of birth, surgical site, and procedure verified ?Anesthesia: the lesion was anesthetized in a standard fashion   ?Anesthetic:  1% lidocaine w/ epinephrine 1-100,000 local infiltration ?Curettage performed in three different directions: Yes   ?Electrodesiccation performed over the curetted area: Yes   ?Curettage cycles:  3 ?Lesion length (cm):  2.2 ?Lesion width (cm):  2.2 ?Margin per side (cm):  0 ?Final wound size (cm):  2.2 ?Hemostasis achieved with:  aluminum chloride ?Outcome: patient tolerated procedure well with no complications   ?Post-procedure details: wound care instructions given   ?Additional details:  Wound inoculated with 5% fluorouracil solution ? ?Left inner cheek ? ?Destruction of lesion ?Complexity: simple   ?Destruction method: electrodesiccation and curettage   ?Informed consent: discussed and consent obtained   ?Timeout:  patient name, date of birth, surgical site, and procedure verified ?Anesthesia: the lesion was anesthetized in  a standard fashion   ?Anesthetic:  1% lidocaine w/ epinephrine 1-100,000 local infiltration ?Curettage performed in three different directions: Yes   ?  Electrodesiccation performed over the curetted area: No   ?Curettage cycles:  3 ?Lesion length (cm):  1.4 ?Lesion width (cm):  1.4 ?Margin per side (cm):  0 ?Final wound size (cm):  1.4 ?Hemostasis achieved with:  aluminum chloride ?Outcome: patient tolerated procedure well with no complications   ?Post-procedure details: wound care instructions given   ?Additional details:  Wound inoculated with 5% fluorouracil solution ? ?Patient has had the mohs on the neck sup inf last year 12/24/20 with Dr Link Snuffer, she is to follow up with Dr Denna Haggard 5/23 for leg surgery ? ? ? ? ? ?I, Kerry Monarch, MD, have reviewed all documentation for this visit.  The documentation on 07/08/21 for the exam, diagnosis, procedures, and orders are all accurate and complete. ?

## 2021-07-09 ENCOUNTER — Other Ambulatory Visit (HOSPITAL_COMMUNITY): Payer: Self-pay | Admitting: Psychiatry

## 2021-07-09 DIAGNOSIS — F332 Major depressive disorder, recurrent severe without psychotic features: Secondary | ICD-10-CM

## 2021-07-09 DIAGNOSIS — M25512 Pain in left shoulder: Secondary | ICD-10-CM | POA: Diagnosis not present

## 2021-07-09 DIAGNOSIS — G5602 Carpal tunnel syndrome, left upper limb: Secondary | ICD-10-CM | POA: Diagnosis not present

## 2021-07-09 DIAGNOSIS — S43432A Superior glenoid labrum lesion of left shoulder, initial encounter: Secondary | ICD-10-CM | POA: Diagnosis not present

## 2021-07-10 ENCOUNTER — Other Ambulatory Visit (HOSPITAL_COMMUNITY): Payer: Self-pay | Admitting: Psychiatry

## 2021-07-10 ENCOUNTER — Other Ambulatory Visit: Payer: Self-pay | Admitting: Family Medicine

## 2021-07-10 DIAGNOSIS — J439 Emphysema, unspecified: Secondary | ICD-10-CM

## 2021-07-10 DIAGNOSIS — F411 Generalized anxiety disorder: Secondary | ICD-10-CM

## 2021-07-11 ENCOUNTER — Ambulatory Visit (INDEPENDENT_AMBULATORY_CARE_PROVIDER_SITE_OTHER): Payer: Medicare Other | Admitting: Dermatology

## 2021-07-11 DIAGNOSIS — C44729 Squamous cell carcinoma of skin of left lower limb, including hip: Secondary | ICD-10-CM | POA: Diagnosis not present

## 2021-07-11 DIAGNOSIS — D0472 Carcinoma in situ of skin of left lower limb, including hip: Secondary | ICD-10-CM | POA: Diagnosis not present

## 2021-07-11 DIAGNOSIS — L570 Actinic keratosis: Secondary | ICD-10-CM

## 2021-07-11 DIAGNOSIS — D485 Neoplasm of uncertain behavior of skin: Secondary | ICD-10-CM

## 2021-07-11 MED ORDER — MUPIROCIN 2 % EX OINT: 1.0000 "application " | TOPICAL_OINTMENT | Freq: Two times a day (BID) | CUTANEOUS | 0 refills | Status: DC

## 2021-07-11 NOTE — Patient Instructions (Signed)
Shave Excision Benign Lesion Wound Care Instructions  . Leave the original bandage on for 24 hours if possible.  If the bandage becomes soaked or soiled before that time, it is OK to remove it and examine the wound.  A small amount of post-operative bleeding is normal.  If excessive bleeding occurs, remove the bandage, place gauze over the site and apply continuous pressure (no peeking) over the area for 20-30 minutes.  If this does not stop the bleeding, try again for 40 minutes.  If this does not work, please call our clinic as soon as possible (even if after-hours).    . Twice a day, cleanse the wound with soap and water.  If a thick crust develops you may use a Q-tip dipped into dilute hydrogen peroxide (mix 1:1 with water) to dissolve it.  Hydrogen peroxide can slow the healing process, so use it only as needed.  After washing, apply Vaseline jelly or Polysporin ointment.  For best healing, the wound should be covered with a layer of ointment at all times.  This may mean re-applying the ointment several times a day.  For open wounds, continue until it has healed.    . If you have any swelling, keep the area elevated.  . Some redness, tenderness and white or yellow material in the wound is normal healing.  If the area becomes very sore and red, or develops a thick yellow-green material (pus), it may be infected; please notify us.    . Wound healing continues for up to one year following surgery.  It is not unusual to experience pain in the scar from time to time during the interval.  If the pain becomes severe or the scar thickens, you should notify the office.  A slight amount of redness in a scar is expected for the first six months.  After six months, the redness subsides and the scar will soften and fade.  The color difference becomes less noticeable with time.  If there are any problems, return for a post-op surgery check at your earliest convenience.  . Please call our office for any questions  or concerns.    

## 2021-07-18 ENCOUNTER — Encounter: Payer: Medicare Other | Admitting: Dermatology

## 2021-07-19 DIAGNOSIS — M6281 Muscle weakness (generalized): Secondary | ICD-10-CM | POA: Diagnosis not present

## 2021-07-22 ENCOUNTER — Telehealth (INDEPENDENT_AMBULATORY_CARE_PROVIDER_SITE_OTHER): Payer: Medicare Other | Admitting: Psychiatry

## 2021-07-22 ENCOUNTER — Encounter (HOSPITAL_COMMUNITY): Payer: Self-pay | Admitting: Psychiatry

## 2021-07-22 DIAGNOSIS — F411 Generalized anxiety disorder: Secondary | ICD-10-CM | POA: Diagnosis not present

## 2021-07-22 DIAGNOSIS — F332 Major depressive disorder, recurrent severe without psychotic features: Secondary | ICD-10-CM

## 2021-07-22 MED ORDER — CLONAZEPAM 1 MG PO TABS: 1.0000 mg | ORAL_TABLET | Freq: Three times a day (TID) | ORAL | 2 refills | Status: DC | PRN

## 2021-07-22 MED ORDER — DULOXETINE HCL 60 MG PO CPEP: 60.0000 mg | ORAL_CAPSULE | Freq: Two times a day (BID) | ORAL | 2 refills | Status: DC

## 2021-07-22 NOTE — Progress Notes (Signed)
Virtual Visit via Telephone Note  I connected with [name] on 07/22/21 at  2:00 PM EDT by telephone and verified that I am speaking with the correct person using two identifiers.  Location: Patient: home Provider: office   I discussed the limitations, risks, security and privacy concerns of performing an evaluation and management service by telephone and the availability of in person appointments. I also discussed with the patient that there may be a patient responsible charge related to this service. The patient expressed understanding and agreed to proceed.      I discussed the assessment and treatment plan with the patient. The patient was provided an opportunity to ask questions and all were answered. The patient agreed with the plan and demonstrated an understanding of the instructions.   The patient was advised to call back or seek an in-person evaluation if the symptoms worsen or if the condition fails to improve as anticipated.  I provided  12 minutes of non-face-to-face time during this encounter.   [name], MD  [facility] MD/PA/NP OP Progress Note  07/22/2021 2:10 PM [name]  MRN:  [id number]  Chief Complaint:  Chief Complaint  Patient presents with   Depression   Anxiety   Follow-up   HPI: This patient is a 66 year old female who is widowed and lives alone in [place].  She is on disability.  She returns after 3 months regarding depression and anxiety  The patient returns for follow-up after 3 months.  For the most part she has been doing okay she still having pain in her shoulder as well as carpal tunnel pain.  However she states her mood has been stable and she denies significant depression or anxiety.  She worries about her older son who is having a lot of arguments with his wife.  She realizes there is not much she can do about it.  She enjoys going outside every day with her dogs.  She is generally sleeping well and she denies thoughts of self-harm or  suicidal ideation.  She is still not been able to cut down her smoking and remains at about a pack a day Visit Diagnosis:    ICD-10-CM   1. GAD (generalized anxiety disorder)  F41.1 clonazePAM (KLONOPIN) 1 MG tablet    2. Severe episode of recurrent major depressive disorder, without psychotic features ([place])  F33.2 DULoxetine (CYMBALTA) 60 MG capsule      Past Psychiatric History: none  Past Medical History:  Past Medical History:  Diagnosis Date   Allergy    Arthritis    Atypical mole 06/05/2003   slight-mod-Left scapula (WS)   Atypical nevus 07/26/2003   persist. dysp- Left scapula (WS)   Basal cell carcinoma 06/05/2003   RIght chest (CX35FU)   Basal cell carcinoma 05/22/2004   superificial- RIght upper back- (CX35FU)   Basal cell carcinoma 11/27/2004   beside R nostril-(MOHS), below right outer nose (MOHS)   Basal cell carcinoma 11/08/2009   left post shoulder -(txpbx)   Basal cell carcinoma 05/24/2012   ulcerated- Left post shoulder- (CX35FU), ulcerated-mid forehead (EXC )   Basal cell carcinoma 06/24/2012   superificial- Right shoulder - (txpbx)    Basal cell carcinoma 07/29/2012    sclerosis- mid forehead (MOHS)   Basal cell carcinoma 06/06/2019   nod-right anterior neck (CX35FU)   Cataract    Closed compression fracture of L5 vertebra (HCC)    Constipation    uses OTC meds with help   COPD (chronic obstructive pulmonary  disease) ([place])    Coronary artery disease    minimal nonobstructive    COVID    Depression    GERD (gastroesophageal reflux disease)    Heart murmur    Hx of adenomatous polyp of colon 10/03/2014   Hypercholesteremia    Hypertension    Panic attacks    Perimenopausal    Skin cancer    Squamous cell carcinoma of skin 06/05/2003   Right [place](CX35FU)   Squamous cell carcinoma of skin 05/22/2004   in situ- Left upperarm (CX35FU)   Squamous cell carcinoma of skin 11/27/2004   in situ- above Left elbow (CX35FU)   Squamous cell carcinoma of  skin 03/04/2006   in situ- Left sideburn (CX35FU)   Squamous cell carcinoma of skin 11/26/2006   in situ- mid brow (Cx35FU)   Squamous cell carcinoma of skin 05/24/2012   in situ- Left chest- (CX35FU)   Squamous cell carcinoma of skin 04/05/2013   in situ- Left nose (Cx35FU)   Squamous cell carcinoma of skin 08/17/2013   well diff-above Left eye (txpbx)   Squamous cell carcinoma of skin 07/30/2016   in situ- right chest sup (Cx35FU)   Squamous cell carcinoma of skin 05/12/2017   in situ- RIght shoulder (CX35FU), in situ- Left forehead (CX35FU)   Squamous cell carcinoma of skin 11/23/2018   in situ- Right mid shin, inner- (MOHS)   Tobacco user     Past Surgical History:  Procedure Laterality Date   ABDOMINAL HYSTERECTOMY     CATARACT EXTRACTION W/PHACO Right 03/01/2021   Procedure: CATARACT EXTRACTION PHACO AND INTRAOCULAR LENS PLACEMENT (IOC);  Surgeon: Baruch Goldmann, MD;  Location: [facility];  Service: Ophthalmology;  Laterality: Right;  CDE: 7.31   CATARACT EXTRACTION W/PHACO Left 05/16/2021   Procedure: CATARACT EXTRACTION PHACO AND INTRAOCULAR LENS PLACEMENT (IOC);  Surgeon: Baruch Goldmann, MD;  Location: [facility];  Service: Ophthalmology;  Laterality: Left;  CDE 9.41    COLONOSCOPY     IR VERTEBROPLASTY CERV/THOR BX INC UNI/BIL INC/INJECT/IMAGING  04/16/2021   Right foot toe surgery     UPPER GASTROINTESTINAL ENDOSCOPY     vocal cord     polyp removal    Family Psychiatric History: see below  Family History:  Family History  Problem Relation Age of Onset   Heart disease Mother    Heart attack Mother    Heart attack Father    Heart attack Brother    Colon cancer Brother 47       died at age 33   Anxiety disorder Brother    Depression Brother    Alcohol abuse Brother    Colon polyps Brother    Colon polyps Brother    Colon polyps Sister    Esophageal cancer Sister    Anxiety disorder Sister    Depression Sister    Colon polyps Sister    Drug abuse Son    Rectal  cancer Neg Hx    Stomach cancer Neg Hx     Social History:  Social History   Socioeconomic History   Marital status: Single    Spouse name: Not on file   Number of children: 2   Years of education: Not on file   Highest education level: Not on file  Occupational History   Occupation: Scientist, 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   Sexual activity: Not on file  Other Topics Concern   Not on file  Social History Narrative   Retired/ lives alone in one level home   Son and daughter in law that live next door and help her out a lot         Social Determinants of Health   Financial Resource Strain: High Risk   Difficulty of Paying Living Expenses: Very hard  Food Insecurity: No Food Insecurity   Worried About Charity fundraiser in the Last Year: Never true   Arboriculturist in the Last Year: Never true  Transportation Needs: No Transportation Needs   Lack of Transportation (Medical): No   Lack of Transportation (Non-Medical): No  Physical Activity: Inactive   Days of Exercise per Week: 0 days   Minutes of Exercise per Session: 0 min  Stress: Stress Concern Present   Feeling of Stress : To some extent  Social Connections: Socially Isolated   Frequency of Communication with Friends and Family: More than three times a week   Frequency of Social Gatherings with Friends and Family: More than three times a week   Attends Religious Services: Never   Marine scientist or Organizations: No   Attends Archivist Meetings: Never   Marital  Status: Divorced    Allergies:  Allergies  Allergen Reactions   Lorcet 10-650 [Hydrocodone-Acetaminophen] Itching   Sulfa Antibiotics Other (See Comments)    aching   Tape Other (See Comments)    Tear skin. Please use paper tape   Wellbutrin Xl [Bupropion Hcl Er (Xl)]     Upset stomach /bad taste in mouth    Metabolic Disorder Labs: Lab Results  Component Value Date   HGBA1C 5.9 (H) 07/12/2013   MPG 123 (H) 07/12/2013   No results found for: PROLACTIN Lab Results  Component Value Date   CHOL 179 05/01/2021   TRIG 231 (H) 05/01/2021   HDL 42 05/01/2021   CHOLHDL 4.3 05/01/2021   VLDL 36 07/13/2013   LDLCALC 98 05/01/2021   LDLCALC 96 08/28/2020   Lab Results  Component Value Date   TSH 1.820 05/25/2017   TSH 1.080 05/17/2015    Therapeutic Level Labs: No results found for: LITHIUM No results found for: VALPROATE No components found for:  CBMZ  Current Medications: Current Outpatient Medications  Medication Sig Dispense Refill   acetaminophen (TYLENOL) 500 MG tablet Take 1,000 mg by mouth every 6 (six) hours as needed for moderate pain.     albuterol (VENTOLIN HFA) 108 (90 Base) MCG/ACT inhaler Inhale 2 puffs into the lungs every 6 (six) hours as needed for wheezing or shortness of breath. 8 g 5   amLODipine (NORVASC) 10 MG tablet TAKE ONE TABLET BY MOUTH DAILY 90 tablet 3   Ascorbic Acid (VITAMIN C) 1000 MG tablet Take 1,000 mg by mouth daily.     atorvastatin (LIPITOR) 40 MG tablet Take 1 tablet (40 mg total) by mouth at bedtime. 90 tablet 3   budesonide-formoterol (SYMBICORT) 160-4.5 MCG/ACT inhaler INHALE TWO PUFFS INTO THE LUNGS TWICE DAILY 10.2  g 2   clonazePAM (KLONOPIN) 1 MG tablet Take 1 tablet (1 mg total) by mouth 3 (three) times daily as needed. for anxiety 90 tablet 2   cyclobenzaprine (FLEXERIL) 5 MG tablet Take 1 tablet (5 mg total) by mouth 3 (three) times daily as needed. 90 tablet 0   dexlansoprazole (DEXILANT) 60 MG capsule TAKE ONE CAPSULE BY  MOUTH DAILY ON EMPTY STOMACH, THEN DO NOT EAT FOR ONE HOUR. (REPLACES PANTROPRAZOLE FOR REFLUX) 90 capsule 3   DULoxetine (CYMBALTA) 60 MG capsule Take 1 capsule (60 mg total) by mouth 2 (two) times daily. 60 capsule 2   fexofenadine (ALLEGRA) 180 MG tablet TAKE ONE TABLET BY MOUTH DAILY FOR ALLERGY SYMPTOMS (Patient taking differently: Take 180 mg by mouth daily.) 30 tablet 11   fluorometholone (FML) 0.1 % ophthalmic suspension SMARTSIG:In Eye(s)     gabapentin (NEURONTIN) 600 MG tablet Take 600 mg by mouth 3 (three) times daily.     ipratropium-albuterol (DUONEB) 0.5-2.5 (3) MG/3ML SOLN Take 3 mLs by nebulization every 6 (six) hours as needed. 360 mL 5   lidocaine (LIDODERM) 5 % Place 1 patch onto the skin daily. Remove & Discard patch within 12 hours or as directed by MD 30 patch 0   lisinopril (ZESTRIL) 20 MG tablet Take 1 tablet (20 mg total) by mouth daily. 90 tablet 3   mupirocin ointment (BACTROBAN) 2 % Apply 1 application. topically 2 (two) times daily. 22 g 0   naproxen sodium (ALEVE) 220 MG tablet Take 220 mg by mouth 2 (two) times daily as needed (pain).     oxyCODONE (ROXICODONE) 15 MG immediate release tablet Take 15 mg by mouth 4 (four) times daily as needed.     predniSONE (DELTASONE) 10 MG tablet Take 1 tablet (10 mg total) by mouth daily with breakfast. 30 tablet 2   rOPINIRole (REQUIP) 1 MG tablet Take 1 tablet (1 mg total) by mouth at bedtime. For leg cramps 30 tablet 5   SPIRIVA RESPIMAT 2.5 MCG/ACT AERS INHALE TWO PUFFS INTO THE LUNGS DAILY 4 g 5   Vitamin D, Ergocalciferol, (DRISDOL) 1.25 MG (50000 UNIT) CAPS capsule Take 1 capsule (50,000 Units total) by mouth every 7 (seven) days. (Patient taking differently: Take 50,000 Units by mouth every 7 (seven) days. Sunday) 13 capsule 3   No current facility-administered medications for this visit.     Musculoskeletal: Strength & Muscle Tone: na Gait & Station: na Patient leans: N/A  Psychiatric Specialty Exam: Review of  Systems  Musculoskeletal:  Positive for arthralgias and joint swelling.  All other systems reviewed and are negative.  There were no vitals taken for this visit.There is no height or weight on file to calculate BMI.  General Appearance: NA  Eye Contact:  NA  Speech:  Clear and Coherent  Volume:  Normal  Mood:  Euthymic  Affect:  NA  Thought Process:  Goal Directed  Orientation:  Full (Time, Place, and Person)  Thought Content: WDL   Suicidal Thoughts:  No  Homicidal Thoughts:  No  Memory:  Immediate;   Good Recent;   Good Remote;   Fair  Judgement:  Good  Insight:  Fair  Psychomotor Activity:  Decreased  Concentration:  Concentration: Good and Attention Span: Good  Recall:  Good  Fund of Knowledge: Fair  Language: Good  Akathisia:  No  Handed:  Right  AIMS (if indicated): not done  Assets:  Communication Skills Desire for Improvement Resilience Social Support Talents/Skills  ADL's:  Intact  Cognition: WNL  Sleep:  Good   Screenings: GAD-7    Flowsheet Row Office Visit from 06/17/2021 in [place] Visit from 05/01/2021 in [place] Visit from 04/08/2021 in [place] Visit from 08/28/2020 in [place] from 11/20/2015 in [facility]  Total GAD-7 Score '5 7 5 7 9      '$ PHQ2-9    Flowsheet Row Video Visit from 07/22/2021 in [place] Office Visit from 06/17/2021 in [place] Visit from 05/01/2021 in [place] Video Visit from 04/23/2021 in [place] Office Visit from 04/08/2021 in [place]  PHQ-2 Total Score 0 '2 2 1 3  '$ PHQ-9 Total Score -- 7 7 -- 9      Flowsheet Row Video Visit from 07/22/2021 in [place] Admission (Discharged) from 05/16/2021 in [place] Video Visit from 04/23/2021 in [place] No Risk No Risk No Risk        Assessment and Plan: This patient is a 66 year old female with a history of depression and anxiety.  She is stable on her current regimen.  She will continue Cymbalta 60 mg twice daily for depression and clonazepam 1 mg 3 times daily for anxiety.  She will return to see me in 3 months  Collaboration of Care: Collaboration of Care: Primary Care Provider AEB chart notes are shared with PCP through the epic system  Patient/Guardian was advised Release of Information must be obtained prior to any record release in order to collaborate their care with an outside provider. Patient/Guardian was advised if they have not already done so to contact the registration department to sign all necessary forms in order for [place] to release information regarding their care.   Consent: Patient/Guardian gives verbal consent for treatment and assignment of benefits for services provided during this visit. Patient/Guardian expressed understanding and agreed to proceed.    [name], MD 07/22/2021, 2:10 PM

## 2021-07-23 DIAGNOSIS — H04213 Epiphora due to excess lacrimation, bilateral lacrimal glands: Secondary | ICD-10-CM | POA: Diagnosis not present

## 2021-07-29 ENCOUNTER — Encounter: Payer: Self-pay | Admitting: Dermatology

## 2021-07-29 NOTE — Progress Notes (Signed)
Follow-Up Visit   Subjective  [name] is a 66 y.o. female who presents for the following: Skin Problem (Here for possible biopsys on both legs. History of non mole skin cancer. Check end of patients nose. ).  2 growing crust on left leg Location:  Duration:  Quality:  Associated Signs/Symptoms: Modifying Factors:  Severity:  Timing: Context:   Objective  Well appearing patient in no apparent distress; mood and affect are within normal limits. Left post thigh 1.1 cm waxy crust, superficial carcinoma  Left medial thigh 6 mm actually crust, suspect new superficial SCCA    A focused examination was performed including head, neck, legs. Relevant physical exam findings are noted in the Assessment and Plan.   Assessment & Plan    Carcinoma in situ of skin of leg, left Left post thigh  Skin / nail biopsy Type of biopsy: tangential   Informed consent: discussed and consent obtained   Patient was prepped and draped in usual sterile fashion: Area prepped with alcohol. Anesthesia: the lesion was anesthetized in a standard fashion   Anesthetic:  1% lidocaine w/ epinephrine 1-100,000 buffered w/ 8.4% NaHCO3 Instrument used: flexible razor blade   Hemostasis achieved with: pressure, aluminum chloride and electrodesiccation   Outcome: patient tolerated procedure well   Post-procedure details: wound care instructions given   Post-procedure details comment:  Ointment and small bandage applied  Destruction of lesion Complexity: extensive   Destruction method: electrodesiccation and curettage   Informed consent: discussed and consent obtained   Timeout:  patient name, date of birth, surgical site, and procedure verified Procedure prep:  Patient was prepped and draped in usual sterile fashion Prep type:  Isopropyl alcohol Anesthesia: the lesion was anesthetized in a standard fashion   Anesthetic:  1% lidocaine w/ epinephrine 1-100,000 buffered w/ 8.4% NaHCO3 Curettage  performed in three different directions: Yes   Electrodesiccation performed over the curetted area: Yes   Curettage cycles:  3 Lesion length (cm):  1.1 Lesion width (cm):  1.1 Margin per side (cm):  0 Final wound size (cm):  1.1 Hemostasis achieved with:  pressure and aluminum chloride Outcome: patient tolerated procedure well with no complications   Post-procedure details: sterile dressing applied and wound care instructions given   Dressing type: bandage and petrolatum    Specimen 2 - Surgical pathology Differential Diagnosis: BCC vs SCC Check Margins: No  After shave biopsy the base of the lesion was treated with curettage plus cautery  Squamous cell carcinoma, leg, left Left medial thigh  Skin / nail biopsy Type of biopsy: tangential   Informed consent: discussed and consent obtained   Patient was prepped and draped in usual sterile fashion: Area prepped with alcohol. Anesthesia: the lesion was anesthetized in a standard fashion   Anesthetic:  1% lidocaine w/ epinephrine 1-100,000 buffered w/ 8.4% NaHCO3 Instrument used: flexible razor blade   Hemostasis achieved with: pressure, aluminum chloride and electrodesiccation   Outcome: patient tolerated procedure well   Post-procedure details: wound care instructions given   Post-procedure details comment:  Ointment and small bandage applied  Destruction of lesion  Destruction method: electrodesiccation and curettage   Informed consent: discussed and consent obtained   Timeout:  patient name, date of birth, surgical site, and procedure verified Procedure prep:  Patient was prepped and draped in usual sterile fashion Prep type:  Isopropyl alcohol Anesthesia: the lesion was anesthetized in a standard fashion   Anesthetic:  1% lidocaine w/ epinephrine 1-100,000 buffered w/ 8.4% NaHCO3 Curettage  performed in three different directions: Yes   Electrodesiccation performed over the curetted area: Yes   Curettage cycles:  3 Lesion  length (cm):  0.8 Lesion width (cm):  0.8 Margin per side (cm):  0 Final wound size (cm):  0.8 Hemostasis achieved with:  pressure and aluminum chloride Outcome: patient tolerated procedure well with no complications   Post-procedure details: sterile dressing applied and wound care instructions given   Dressing type: bandage and petrolatum    Specimen 1 - Surgical pathology Differential Diagnosis: BCC vs SCC Check Margins: No  After shave biopsy the base of the lesion was treated with curettage plus cautery      I, [name], MD, have reviewed all documentation for this visit.  The documentation on 07/29/21 for the exam, diagnosis, procedures, and orders are all accurate and complete.

## 2021-07-30 ENCOUNTER — Ambulatory Visit
Admission: RE | Admit: 2021-07-30 | Discharge: 2021-07-30 | Disposition: A | Discharge status: Home / Self Care | Payer: Medicare Other | Source: Ambulatory Visit | Attending: Family Medicine | Admitting: Family Medicine

## 2021-07-30 DIAGNOSIS — Z1231 Encounter for screening mammogram for malignant neoplasm of breast: Secondary | ICD-10-CM | POA: Diagnosis not present

## 2021-08-01 DIAGNOSIS — G5602 Carpal tunnel syndrome, left upper limb: Secondary | ICD-10-CM | POA: Diagnosis not present

## 2021-08-08 ENCOUNTER — Other Ambulatory Visit: Payer: Self-pay | Admitting: Family Medicine

## 2021-08-19 DIAGNOSIS — M6281 Muscle weakness (generalized): Secondary | ICD-10-CM | POA: Diagnosis not present

## 2021-09-09 DIAGNOSIS — G5602 Carpal tunnel syndrome, left upper limb: Secondary | ICD-10-CM | POA: Diagnosis not present

## 2021-09-09 DIAGNOSIS — G5603 Carpal tunnel syndrome, bilateral upper limbs: Secondary | ICD-10-CM | POA: Diagnosis not present

## 2021-09-18 DIAGNOSIS — M6281 Muscle weakness (generalized): Secondary | ICD-10-CM | POA: Diagnosis not present

## 2021-10-09 ENCOUNTER — Other Ambulatory Visit (HOSPITAL_COMMUNITY): Payer: Self-pay | Admitting: Psychiatry

## 2021-10-09 ENCOUNTER — Other Ambulatory Visit: Payer: Self-pay | Admitting: Family Medicine

## 2021-10-09 DIAGNOSIS — J439 Emphysema, unspecified: Secondary | ICD-10-CM

## 2021-10-09 DIAGNOSIS — F332 Major depressive disorder, recurrent severe without psychotic features: Secondary | ICD-10-CM

## 2021-10-09 DIAGNOSIS — F411 Generalized anxiety disorder: Secondary | ICD-10-CM

## 2021-10-18 DIAGNOSIS — M6281 Muscle weakness (generalized): Secondary | ICD-10-CM | POA: Diagnosis not present

## 2021-10-21 ENCOUNTER — Telehealth (INDEPENDENT_AMBULATORY_CARE_PROVIDER_SITE_OTHER): Payer: Medicare Other | Admitting: Psychiatry

## 2021-10-21 ENCOUNTER — Encounter (HOSPITAL_COMMUNITY): Payer: Self-pay | Admitting: Psychiatry

## 2021-10-21 DIAGNOSIS — F411 Generalized anxiety disorder: Secondary | ICD-10-CM | POA: Diagnosis not present

## 2021-10-21 DIAGNOSIS — F332 Major depressive disorder, recurrent severe without psychotic features: Secondary | ICD-10-CM

## 2021-10-21 MED ORDER — DULOXETINE HCL 60 MG PO CPEP: 60.0000 mg | ORAL_CAPSULE | Freq: Two times a day (BID) | ORAL | 2 refills | Status: DC

## 2021-10-21 MED ORDER — CLONAZEPAM 1 MG PO TABS: 1.0000 mg | ORAL_TABLET | Freq: Three times a day (TID) | ORAL | 2 refills | Status: DC | PRN

## 2021-10-21 NOTE — Progress Notes (Signed)
Virtual Visit via Telephone Note  I connected with [name] on 10/21/21 at  1:20 PM EDT by telephone and verified that I am speaking with the correct person using two identifiers.  Location: Patient: Home Provider: office   I discussed the limitations, risks, security and privacy concerns of performing an evaluation and management service by telephone and the availability of in person appointments. I also discussed with the patient that there may be a patient responsible charge related to this service. The patient expressed understanding and agreed to proceed.     I discussed the assessment and treatment plan with the patient. The patient was provided an opportunity to ask questions and all were answered. The patient agreed with the plan and demonstrated an understanding of the instructions.   The patient was advised to call back or seek an in-person evaluation if the symptoms worsen or if the condition fails to improve as anticipated.  I provided 12 minutes of non-face-to-face time during this encounter.   [name], MD  [facility] MD/PA/NP OP Progress Note  10/21/2021 1:41 PM [name]  MRN:  [id number]  Chief Complaint:  Chief Complaint  Patient presents with   Anxiety   Depression   Follow-up   HPI: This patient is a 66 year old female who is widowed and lives alone in [place].  She is on disability.  She returns after 3 months regarding depression and anxiety  The patient returns for follow-up after 3 months.  She is having a lot of difficulties right now.  She still has chronic pain in her shoulder and carpal tunnel.  The surgeon to do the carpal tunnel will do the surgery until she quit smoking.  She is still at a pack a day.  She is worried a lot about finances as she has had a lot of unexpected bills,.  Nevertheless she thinks the medications for depression and anxiety have helped.  She is sleeping well and tries to do a short walk with her dogs daily Visit Diagnosis:     ICD-10-CM   1. GAD (generalized anxiety disorder)  F41.1 clonazePAM (KLONOPIN) 1 MG tablet    2. Severe episode of recurrent major depressive disorder, without psychotic features ([place])  F33.2 DULoxetine (CYMBALTA) 60 MG capsule      Past Psychiatric History: None  Past Medical History:  Past Medical History:  Diagnosis Date   Allergy    Arthritis    Atypical mole 06/05/2003   slight-mod-Left scapula (WS)   Atypical nevus 07/26/2003   persist. dysp- Left scapula (WS)   Basal cell carcinoma 06/05/2003   RIght chest (CX35FU)   Basal cell carcinoma 05/22/2004   superificial- RIght upper back- (CX35FU)   Basal cell carcinoma 11/27/2004   beside R nostril-(MOHS), below right outer nose (MOHS)   Basal cell carcinoma 11/08/2009   left post shoulder -(txpbx)   Basal cell carcinoma 05/24/2012   ulcerated- Left post shoulder- (CX35FU), ulcerated-mid forehead (EXC )   Basal cell carcinoma 06/24/2012   superificial- Right shoulder - (txpbx)    Basal cell carcinoma 07/29/2012    sclerosis- mid forehead (MOHS)   Basal cell carcinoma 06/06/2019   nod-right anterior neck (CX35FU)   Cataract    Closed compression fracture of L5 vertebra (HCC)    Constipation    uses OTC meds with help   COPD (chronic obstructive pulmonary disease) ([place])    Coronary artery disease    minimal nonobstructive    COVID    Depression  GERD (gastroesophageal reflux disease)    Heart murmur    Hx of adenomatous polyp of colon 10/03/2014   Hypercholesteremia    Hypertension    Panic attacks    Perimenopausal    Skin cancer    Squamous cell carcinoma of skin 06/05/2003   Right [place](CX35FU)   Squamous cell carcinoma of skin 05/22/2004   in situ- Left upperarm (CX35FU)   Squamous cell carcinoma of skin 11/27/2004   in situ- above Left elbow (CX35FU)   Squamous cell carcinoma of skin 03/04/2006   in situ- Left sideburn (CX35FU)   Squamous cell carcinoma of skin 11/26/2006   in situ- mid brow  (Cx35FU)   Squamous cell carcinoma of skin 05/24/2012   in situ- Left chest- (CX35FU)   Squamous cell carcinoma of skin 04/05/2013   in situ- Left nose (Cx35FU)   Squamous cell carcinoma of skin 08/17/2013   well diff-above Left eye (txpbx)   Squamous cell carcinoma of skin 07/30/2016   in situ- right chest sup (Cx35FU)   Squamous cell carcinoma of skin 05/12/2017   in situ- RIght shoulder (CX35FU), in situ- Left forehead (CX35FU)   Squamous cell carcinoma of skin 11/23/2018   in situ- Right mid shin, inner- (MOHS)   Tobacco user     Past Surgical History:  Procedure Laterality Date   ABDOMINAL HYSTERECTOMY     CATARACT EXTRACTION W/PHACO Right 03/01/2021   Procedure: CATARACT EXTRACTION PHACO AND INTRAOCULAR LENS PLACEMENT (IOC);  Surgeon: Baruch Goldmann, MD;  Location: [facility];  Service: Ophthalmology;  Laterality: Right;  CDE: 7.31   CATARACT EXTRACTION W/PHACO Left 05/16/2021   Procedure: CATARACT EXTRACTION PHACO AND INTRAOCULAR LENS PLACEMENT (IOC);  Surgeon: Baruch Goldmann, MD;  Location: [facility];  Service: Ophthalmology;  Laterality: Left;  CDE 9.41    COLONOSCOPY     IR VERTEBROPLASTY CERV/THOR BX INC UNI/BIL INC/INJECT/IMAGING  04/16/2021   Right foot toe surgery     UPPER GASTROINTESTINAL ENDOSCOPY     vocal cord     polyp removal    Family Psychiatric History: See below  Family History:  Family History  Problem Relation Age of Onset   Heart disease Mother    Heart attack Mother    Heart attack Father    Colon polyps Sister    Esophageal cancer Sister    Anxiety disorder Sister    Depression Sister    Colon polyps Sister    Heart attack Brother    Colon cancer Brother 73       died at age 75   Anxiety disorder Brother    Depression Brother    Alcohol abuse Brother    Colon polyps Brother    Colon polyps Brother    Drug abuse Son    Breast cancer Niece    Rectal cancer Neg Hx    Stomach cancer Neg Hx     Social History:  Social History    Socioeconomic History   Marital status: Single    Spouse name: Not on file   Number of children: 2   Years of education: Not on file   Highest education level: Not on file  Occupational History   Occupation: Scientist, 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   Sexual activity: Not on file  Other Topics Concern   Not on file  Social History Narrative   Retired/ lives alone in one level home   Son and daughter in law that live next door and help her out a lot         Social Determinants of Health   Financial Resource Strain: High Risk (04/08/2021)   Overall Financial Resource Strain (CARDIA)    Difficulty of Paying Living Expenses: Very hard  Food Insecurity: No Food Insecurity (04/08/2021)   Hunger Vital Sign    Worried About Running Out of Food in the Last Year: Never true    [place] in the Last Year: Never true  Transportation Needs: No Transportation Needs (04/08/2021)   PRAPARE - Hydrologist (Medical): No    Lack of Transportation (Non-Medical): No  Physical Activity: Inactive (04/08/2021)   Exercise Vital Sign    Days of Exercise per Week: 0 days    Minutes of Exercise per Session: 0 min  Stress: Stress Concern Present (04/08/2021)   [place]    Feeling of Stress : To some extent  Social Connections: Socially Isolated (04/08/2021)   Social Connection and Isolation Panel [NHANES]    Frequency of Communication with Friends and Family:  More than three times a week    Frequency of Social Gatherings with Friends and Family: More than three times a week    Attends Religious Services: Never    Marine scientist or Organizations: No    Attends Archivist Meetings: Never    Marital Status: Divorced    Allergies:  Allergies  Allergen Reactions   Lorcet 10-650 [Hydrocodone-Acetaminophen] Itching   Sulfa Antibiotics Other (See Comments)    aching   Tape Other (See Comments)    Tear skin. Please use paper tape   Wellbutrin Xl [Bupropion Hcl Er (Xl)]     Upset stomach /bad taste in mouth    Metabolic Disorder Labs: Lab Results  Component Value Date   HGBA1C 5.9 (H) 07/12/2013   MPG 123 (H) 07/12/2013   No results found for: "PROLACTIN" Lab Results  Component Value Date   CHOL 179 05/01/2021   TRIG 231 (H) 05/01/2021   HDL 42 05/01/2021   CHOLHDL 4.3 05/01/2021   VLDL 36 07/13/2013   LDLCALC 98 05/01/2021   LDLCALC 96 08/28/2020   Lab Results  Component Value Date   TSH 1.820 05/25/2017   TSH 1.080 05/17/2015    Therapeutic Level Labs: No results found for: "LITHIUM" No results found for: "VALPROATE" No results found for: "CBMZ"  Current Medications: Current Outpatient Medications  Medication Sig Dispense Refill   acetaminophen (TYLENOL) 500 MG tablet Take 1,000 mg by mouth every 6 (six) hours as needed for moderate pain.     albuterol (VENTOLIN HFA) 108 (90 Base) MCG/ACT inhaler Inhale 2 puffs into the lungs every 6 (six) hours as needed for wheezing or shortness of breath. 8 g 5   amLODipine (NORVASC) 10 MG tablet TAKE ONE TABLET BY MOUTH DAILY 90 tablet 3   Ascorbic Acid (VITAMIN C)  1000 MG tablet Take 1,000 mg by mouth daily.     atorvastatin (LIPITOR) 40 MG tablet Take 1 tablet (40 mg total) by mouth at bedtime. 90 tablet 3   budesonide-formoterol (SYMBICORT) 160-4.5 MCG/ACT inhaler INHALE TWO PUFFS INTO THE LUNGS TWICE DAILY (NEEDS TO BE SEEN BEFORE NEXT REFILL) 10.2 g 0    clonazePAM (KLONOPIN) 1 MG tablet Take 1 tablet (1 mg total) by mouth 3 (three) times daily as needed. for anxiety 90 tablet 2   cyclobenzaprine (FLEXERIL) 5 MG tablet Take 1 tablet (5 mg total) by mouth 3 (three) times daily as needed. 90 tablet 0   dexlansoprazole (DEXILANT) 60 MG capsule TAKE ONE CAPSULE BY MOUTH DAILY ON EMPTY STOMACH, THEN DO NOT EAT FOR ONE HOUR. (REPLACES PANTROPRAZOLE FOR REFLUX) 90 capsule 3   DULoxetine (CYMBALTA) 60 MG capsule Take 1 capsule (60 mg total) by mouth 2 (two) times daily. 60 capsule 2   fexofenadine (ALLEGRA) 180 MG tablet TAKE ONE TABLET BY MOUTH DAILY FOR ALLERGY SYMPTOMS (Patient taking differently: Take 180 mg by mouth daily.) 30 tablet 11   fluorometholone (FML) 0.1 % ophthalmic suspension SMARTSIG:In Eye(s)     gabapentin (NEURONTIN) 600 MG tablet Take 600 mg by mouth 3 (three) times daily.     ipratropium-albuterol (DUONEB) 0.5-2.5 (3) MG/3ML SOLN Take 3 mLs by nebulization every 6 (six) hours as needed. 360 mL 5   lidocaine (LIDODERM) 5 % Place 1 patch onto the skin daily. Remove & Discard patch within 12 hours or as directed by MD 30 patch 0   lisinopril (ZESTRIL) 20 MG tablet Take 1 tablet (20 mg total) by mouth daily. 90 tablet 3   mupirocin ointment (BACTROBAN) 2 % Apply 1 application. topically 2 (two) times daily. 22 g 0   naproxen sodium (ALEVE) 220 MG tablet Take 220 mg by mouth 2 (two) times daily as needed (pain).     oxyCODONE (ROXICODONE) 15 MG immediate release tablet Take 15 mg by mouth 4 (four) times daily as needed.     rOPINIRole (REQUIP) 1 MG tablet Take 1 tablet (1 mg total) by mouth at bedtime. For leg cramps 30 tablet 5   Tiotropium Bromide Monohydrate (SPIRIVA RESPIMAT) 2.5 MCG/ACT AERS INHALE TWO PUFFS INTO THE LUNGS DAILY (NEEDS TO BE SEEN BEFORE NEXT REFILL) 4 g 5   Vitamin D, Ergocalciferol, (DRISDOL) 1.25 MG (50000 UNIT) CAPS capsule TAKE ONE CAPSULE BY MOUTH ONCE WEEKLY 13 capsule 3   No current facility-administered  medications for this visit.     Musculoskeletal: Strength & Muscle Tone: na Gait & Station: na Patient leans: N/A  Psychiatric Specialty Exam: Review of Systems  Musculoskeletal:  Positive for arthralgias, back pain and myalgias.  Psychiatric/Behavioral:  The patient is nervous/anxious.   All other systems reviewed and are negative.   There were no vitals taken for this visit.There is no height or weight on file to calculate BMI.  General Appearance: NA  Eye Contact:  NA  Speech:  Clear and Coherent  Volume:  Normal  Mood:  Anxious  Affect:  NA  Thought Process:  Goal Directed  Orientation:  Full (Time, Place, and Person)  Thought Content: WDL   Suicidal Thoughts:  No  Homicidal Thoughts:  No  Memory:  Immediate;   Good Recent;   Good Remote;   NA  Judgement:  Good  Insight:  Fair  Psychomotor Activity:  Decreased  Concentration:  Concentration: Good and Attention Span: Good  Recall:  Good  Fund of  Knowledge: Good  Language: Good  Akathisia:  No  Handed:  Right  AIMS (if indicated): not done  Assets:  Communication Skills Desire for Improvement Social Support Talents/Skills  ADL's:  Intact  Cognition: WNL  Sleep:  Good   Screenings: GAD-7    Flowsheet Row Office Visit from 06/17/2021 in [place] Visit from 05/01/2021 in [place] Office Visit from 04/08/2021 in [place] Office Visit from 08/28/2020 in [place] from 11/20/2015 in [facility]  Total GAD-7 Score '5 7 5 7 9      '$ PHQ2-9    Flowsheet Row Video Visit from 10/21/2021 in [facility] [facility] Video Visit from 07/22/2021 in [place] Office Visit from 06/17/2021 in [place] Office Visit from 05/01/2021 in [place] Video Visit  from 04/23/2021 in [facility] [facility]  PHQ-2 Total Score 4 0 '2 2 1  '$ PHQ-9 Total Score 8 -- 7 7 --      Flowsheet Row Video Visit from 10/21/2021 in [facility] [facility] Video Visit from 07/22/2021 in [place] [facility] Admission (Discharged) from 05/16/2021 in [place] No Risk No Risk No Risk        Assessment and Plan: This patient is a 66 year old female with a history of depression and anxiety.  Despite all her physical problems she seems fairly stable on her current regimen.  She will continue Cymbalta 60 mg twice daily for depression and clonazepam 1 mg 3 times daily for anxiety.  She will return to see me in 3 months  Collaboration of Care: Collaboration of Care: Primary Care Provider AEB are shared with PCP through the epic system  Patient/Guardian was advised Release of Information must be obtained prior to any record release in order to collaborate their care with an outside provider. Patient/Guardian was advised if they have not already done so to contact the registration department to sign all necessary forms in order for [place] to release information regarding their care.   Consent: Patient/Guardian gives verbal consent for treatment and assignment of benefits for services provided during this visit. Patient/Guardian expressed understanding and agreed to proceed.    [name], MD 10/21/2021, 1:41 PM

## 2021-11-15 DIAGNOSIS — J441 Chronic obstructive pulmonary disease with (acute) exacerbation: Secondary | ICD-10-CM | POA: Diagnosis not present

## 2021-11-15 DIAGNOSIS — Z20822 Contact with and (suspected) exposure to covid-19: Secondary | ICD-10-CM | POA: Diagnosis not present

## 2021-11-15 DIAGNOSIS — Z882 Allergy status to sulfonamides status: Secondary | ICD-10-CM | POA: Diagnosis not present

## 2021-11-15 DIAGNOSIS — I2510 Atherosclerotic heart disease of native coronary artery without angina pectoris: Secondary | ICD-10-CM | POA: Diagnosis not present

## 2021-11-15 DIAGNOSIS — R531 Weakness: Secondary | ICD-10-CM | POA: Diagnosis not present

## 2021-11-15 DIAGNOSIS — R059 Cough, unspecified: Secondary | ICD-10-CM | POA: Diagnosis not present

## 2021-11-15 DIAGNOSIS — Z720 Tobacco use: Secondary | ICD-10-CM | POA: Diagnosis not present

## 2021-11-15 DIAGNOSIS — F17210 Nicotine dependence, cigarettes, uncomplicated: Secondary | ICD-10-CM | POA: Diagnosis not present

## 2021-11-17 DIAGNOSIS — M6281 Muscle weakness (generalized): Secondary | ICD-10-CM | POA: Diagnosis not present

## 2021-11-21 DIAGNOSIS — G2581 Restless legs syndrome: Secondary | ICD-10-CM | POA: Diagnosis not present

## 2021-11-21 DIAGNOSIS — G43909 Migraine, unspecified, not intractable, without status migrainosus: Secondary | ICD-10-CM | POA: Diagnosis not present

## 2021-11-21 DIAGNOSIS — Z79899 Other long term (current) drug therapy: Secondary | ICD-10-CM | POA: Diagnosis not present

## 2021-11-21 DIAGNOSIS — M5459 Other low back pain: Secondary | ICD-10-CM | POA: Diagnosis not present

## 2021-11-21 DIAGNOSIS — M542 Cervicalgia: Secondary | ICD-10-CM | POA: Diagnosis not present

## 2021-12-16 ENCOUNTER — Encounter: Payer: Self-pay | Admitting: Family Medicine

## 2021-12-16 ENCOUNTER — Ambulatory Visit (INDEPENDENT_AMBULATORY_CARE_PROVIDER_SITE_OTHER): Payer: Medicare Other | Admitting: Family Medicine

## 2021-12-16 VITALS — BP 121/68 | HR 92 | Temp 98.4°F | Ht 65.0 in | Wt 147.8 lb

## 2021-12-16 DIAGNOSIS — Z23 Encounter for immunization: Secondary | ICD-10-CM

## 2021-12-16 DIAGNOSIS — I10 Essential (primary) hypertension: Secondary | ICD-10-CM | POA: Diagnosis not present

## 2021-12-16 DIAGNOSIS — D485 Neoplasm of uncertain behavior of skin: Secondary | ICD-10-CM

## 2021-12-16 DIAGNOSIS — J0100 Acute maxillary sinusitis, unspecified: Secondary | ICD-10-CM

## 2021-12-16 DIAGNOSIS — J439 Emphysema, unspecified: Secondary | ICD-10-CM

## 2021-12-16 DIAGNOSIS — E782 Mixed hyperlipidemia: Secondary | ICD-10-CM

## 2021-12-16 MED ORDER — PREDNISONE 10 MG PO TABS: ORAL_TABLET | ORAL | 0 refills | Status: DC

## 2021-12-16 MED ORDER — ROPINIROLE HCL 2 MG PO TABS: 2.0000 mg | ORAL_TABLET | Freq: Every day | ORAL | 1 refills | Status: DC

## 2021-12-16 MED ORDER — BUDESONIDE-FORMOTEROL FUMARATE 160-4.5 MCG/ACT IN AERO: INHALATION_SPRAY | RESPIRATORY_TRACT | 2 refills | Status: DC

## 2021-12-16 MED ORDER — CEFPROZIL 500 MG PO TABS: 500.0000 mg | ORAL_TABLET | Freq: Two times a day (BID) | ORAL | 0 refills | Status: DC

## 2021-12-16 NOTE — Progress Notes (Signed)
Subjective:  Patient ID: [name], female    DOB: 1956-02-04  Age: 66 y.o. MRN: [id number]  CC: Medical Management of Chronic Issues   HPI [name] presents for Symptoms include congestion, facial pain, nasal congestion, non productive cough, post nasal drip and sinus pressure. There is no fever, chills, or sweats. Onset of symptoms was a few days ago, gradually worsening since that time.   Legs still cramping and shaking. Uncontrollable when it gets going,  Smoking 1.5 ppd now. Smokes due to worry about kids - they are on drugs.        12/16/2021    2:02 PM 12/16/2021    1:53 PM 10/21/2021    1:31 PM  Depression screen PHQ 2/9  Decreased Interest 2 0   Down, Depressed, Hopeless 2 0   PHQ - 2 Score 4 0   Altered sleeping 2    Tired, decreased energy 2    Change in appetite 0    Feeling bad or failure about yourself  0    Trouble concentrating 2    Moving slowly or fidgety/restless     Suicidal thoughts 0    PHQ-9 Score 10    Difficult doing work/chores Somewhat difficult       Information is confidential and restricted. Go to Review Flowsheets to unlock data.    History [name] has a past medical history of Allergy, Arthritis, Atypical mole (06/05/2003), Atypical nevus (07/26/2003), Basal cell carcinoma (06/05/2003), Basal cell carcinoma (05/22/2004), Basal cell carcinoma (11/27/2004), Basal cell carcinoma (11/08/2009), Basal cell carcinoma (05/24/2012), Basal cell carcinoma (06/24/2012), Basal cell carcinoma (07/29/2012), Basal cell carcinoma (06/06/2019), Cataract, Closed compression fracture of L5 vertebra ([place]), Constipation, COPD (chronic obstructive pulmonary disease) ([place]), Coronary artery disease, COVID, Depression, GERD (gastroesophageal reflux disease), Heart murmur, adenomatous polyp of colon (10/03/2014), Hypercholesteremia, Hypertension, Panic attacks, Perimenopausal, Skin cancer, Squamous cell carcinoma of skin (06/05/2003), Squamous cell carcinoma of  skin (05/22/2004), Squamous cell carcinoma of skin (11/27/2004), Squamous cell carcinoma of skin (03/04/2006), Squamous cell carcinoma of skin (11/26/2006), Squamous cell carcinoma of skin (05/24/2012), Squamous cell carcinoma of skin (04/05/2013), Squamous cell carcinoma of skin (08/17/2013), Squamous cell carcinoma of skin (07/30/2016), Squamous cell carcinoma of skin (05/12/2017), Squamous cell carcinoma of skin (11/23/2018), and Tobacco user.   She has a past surgical history that includes Abdominal hysterectomy; vocal cord; Right foot toe surgery; Colonoscopy; Upper gastrointestinal endoscopy; Cataract extraction w/PHACO (Right, 03/01/2021); IR VERTEBROPLASTY CERV/THOR BX INC UNI/BIL INC/INJECT/IMAGING (04/16/2021); and Cataract extraction w/PHACO (Left, 05/16/2021).   Her family history includes Alcohol abuse in her brother; Anxiety disorder in her brother and sister; Breast cancer in her niece; Colon cancer (age of onset: 15) in her brother; Colon polyps in her brother, brother, sister, and sister; Depression in her brother and sister; Drug abuse in her son; Esophageal cancer in her sister; Heart attack in her brother, father, and mother; Heart disease in her mother.She reports that she has been smoking cigarettes. She started smoking about 52 years ago. She has a 40.00 pack-year smoking history. She has never used smokeless tobacco. She reports that she does not drink alcohol and does not use drugs.    ROS Review of Systems  Constitutional: Negative.   HENT: Negative.    Eyes:  Negative for visual disturbance.  Respiratory:  Negative for shortness of breath.   Cardiovascular:  Negative for chest pain.  Gastrointestinal:  Negative for abdominal pain.  Musculoskeletal:  Negative for arthralgias.  Neurological:  Positive for dizziness (onset  3-4 mos ago. sTOOD UP, GOT DIZZY. Went away after a minute. Recurring from time to time).    Objective:  BP 121/68   Pulse 92   Temp 98.4 F (36.9 C)    Ht $R'5\' 5"'qW$  (1.651 m)   Wt 147 lb 12.8 oz (67 kg)   SpO2 98%   BMI 24.60 kg/m   BP Readings from Last 3 Encounters:  12/16/21 121/68  06/17/21 119/69  05/16/21 133/90    Wt Readings from Last 3 Encounters:  12/16/21 147 lb 12.8 oz (67 kg)  06/17/21 158 lb 12.8 oz (72 kg)  05/01/21 157 lb 12.8 oz (71.6 kg)     Physical Exam Constitutional:      General: She is not in acute distress.    Appearance: She is well-developed.  Cardiovascular:     Rate and Rhythm: Normal rate and regular rhythm.  Pulmonary:     Breath sounds: Normal breath sounds.  Musculoskeletal:        General: Normal range of motion.  Skin:    General: Skin is warm and dry.  Neurological:     Mental Status: She is alert and oriented to person, place, and time.       Assessment & Plan:   [name] was seen today for medical management of chronic issues.  Diagnoses and all orders for this visit:  Pulmonary emphysema, unspecified emphysema type ([place]) -     budesonide-formoterol (SYMBICORT) 160-4.5 MCG/ACT inhaler; INHALE TWO PUFFS INTO THE LUNGS TWICE DAILY -     Ambulatory referral to Pulmonology  Essential hypertension -     CBC with Differential/Platelet -     CMP14+EGFR  Mixed hyperlipidemia -     Lipid panel  Acute maxillary sinusitis, recurrence not specified  Neoplasm of uncertain behavior of skin -     Ambulatory referral to Dermatology  Other orders -     rOPINIRole (REQUIP) 2 MG tablet; Take 1 tablet (2 mg total) by mouth at bedtime. For leg cramps -     cefPROZIL (CEFZIL) 500 MG tablet; Take 1 tablet (500 mg total) by mouth 2 (two) times daily. For infection. Take all of this medication. -     predniSONE (DELTASONE) 10 MG tablet; Take 5 daily for 2 days followed by 4,3,2 and 1 for 2 days each.       I have changed [name]'s budesonide-formoterol and rOPINIRole. I am also having her start on cefPROZIL and predniSONE. Additionally, I am having her maintain her vitamin C,  ipratropium-albuterol, oxyCODONE, albuterol, amLODipine, fexofenadine, lidocaine, cyclobenzaprine, gabapentin, acetaminophen, naproxen sodium, atorvastatin, lisinopril, dexlansoprazole, fluorometholone, mupirocin ointment, Vitamin D (Ergocalciferol), Spiriva Respimat, clonazePAM, and DULoxetine.  Allergies as of 12/16/2021       Reactions   Lorcet 10-650 [hydrocodone-acetaminophen] Itching   Sulfa Antibiotics Other (See Comments)   aching   Tape Other (See Comments)   Tear skin. Please use paper tape   Wellbutrin Xl [bupropion Hcl Er (xl)]    Upset stomach /bad taste in mouth        Medication List        Accurate as of December 16, 2021  2:35 PM. If you have any questions, ask your nurse or doctor.          acetaminophen 500 MG tablet Commonly known as: TYLENOL Take 1,000 mg by mouth every 6 (six) hours as needed for moderate pain.   albuterol 108 (90 Base) MCG/ACT inhaler Commonly known as: VENTOLIN HFA Inhale 2 puffs into  the lungs every 6 (six) hours as needed for wheezing or shortness of breath.   amLODipine 10 MG tablet Commonly known as: NORVASC TAKE ONE TABLET BY MOUTH DAILY   atorvastatin 40 MG tablet Commonly known as: LIPITOR Take 1 tablet (40 mg total) by mouth at bedtime.   budesonide-formoterol 160-4.5 MCG/ACT inhaler Commonly known as: Symbicort INHALE TWO PUFFS INTO THE LUNGS TWICE DAILY What changed: additional instructions Changed by: Claretta Fraise, MD   cefPROZIL 500 MG tablet Commonly known as: CEFZIL Take 1 tablet (500 mg total) by mouth 2 (two) times daily. For infection. Take all of this medication. Started by: Claretta Fraise, MD   clonazePAM 1 MG tablet Commonly known as: KLONOPIN Take 1 tablet (1 mg total) by mouth 3 (three) times daily as needed. for anxiety   cyclobenzaprine 5 MG tablet Commonly known as: FLEXERIL Take 1 tablet (5 mg total) by mouth 3 (three) times daily as needed.   dexlansoprazole 60 MG capsule Commonly known  as: Dexilant TAKE ONE CAPSULE BY MOUTH DAILY ON EMPTY STOMACH, THEN DO NOT EAT FOR ONE HOUR. (REPLACES PANTROPRAZOLE FOR REFLUX)   DULoxetine 60 MG capsule Commonly known as: CYMBALTA Take 1 capsule (60 mg total) by mouth 2 (two) times daily.   fexofenadine 180 MG tablet Commonly known as: ALLEGRA TAKE ONE TABLET BY MOUTH DAILY FOR ALLERGY SYMPTOMS What changed: See the new instructions.   fluorometholone 0.1 % ophthalmic suspension Commonly known as: FML SMARTSIG:In Eye(s)   gabapentin 600 MG tablet Commonly known as: NEURONTIN Take 600 mg by mouth 3 (three) times daily.   ipratropium-albuterol 0.5-2.5 (3) MG/3ML Soln Commonly known as: DUONEB Take 3 mLs by nebulization every 6 (six) hours as needed.   lidocaine 5 % Commonly known as: Lidoderm Place 1 patch onto the skin daily. Remove & Discard patch within 12 hours or as directed by MD   lisinopril 20 MG tablet Commonly known as: ZESTRIL Take 1 tablet (20 mg total) by mouth daily.   mupirocin ointment 2 % Commonly known as: BACTROBAN Apply 1 application. topically 2 (two) times daily.   naproxen sodium 220 MG tablet Commonly known as: ALEVE Take 220 mg by mouth 2 (two) times daily as needed (pain).   oxyCODONE 15 MG immediate release tablet Commonly known as: ROXICODONE Take 15 mg by mouth 4 (four) times daily as needed.   predniSONE 10 MG tablet Commonly known as: DELTASONE Take 5 daily for 2 days followed by 4,3,2 and 1 for 2 days each. Started by: Claretta Fraise, MD   rOPINIRole 2 MG tablet Commonly known as: REQUIP Take 1 tablet (2 mg total) by mouth at bedtime. For leg cramps What changed:  medication strength how much to take Changed by: Claretta Fraise, MD   Spiriva Respimat 2.5 MCG/ACT Aers Generic drug: Tiotropium Bromide Monohydrate INHALE TWO PUFFS INTO THE LUNGS DAILY (NEEDS TO BE SEEN BEFORE NEXT REFILL)   vitamin C 1000 MG tablet Take 1,000 mg by mouth daily.   Vitamin D (Ergocalciferol)  1.25 MG (50000 UNIT) Caps capsule Commonly known as: DRISDOL TAKE ONE CAPSULE BY MOUTH ONCE WEEKLY         Follow-up: Return in about 6 months (around 06/17/2022).  Claretta Fraise, M.D.

## 2021-12-17 DIAGNOSIS — M6281 Muscle weakness (generalized): Secondary | ICD-10-CM | POA: Diagnosis not present

## 2021-12-17 LAB — CBC WITH DIFFERENTIAL/PLATELET
Basophils Absolute: 0 10*3/uL (ref 0.0–0.2)
Basos: 0 %
EOS (ABSOLUTE): 0.1 10*3/uL (ref 0.0–0.4)
Eos: 1 %
Hematocrit: 43.8 % (ref 34.0–46.6)
Hemoglobin: 14.3 g/dL (ref 11.1–15.9)
Immature Grans (Abs): 0.1 10*3/uL (ref 0.0–0.1)
Immature Granulocytes: 1 %
Lymphocytes Absolute: 1.6 10*3/uL (ref 0.7–3.1)
Lymphs: 14 %
MCH: 28.8 pg (ref 26.6–33.0)
MCHC: 32.6 g/dL (ref 31.5–35.7)
MCV: 88 fL (ref 79–97)
Monocytes Absolute: 0.8 10*3/uL (ref 0.1–0.9)
Monocytes: 6 %
Neutrophils Absolute: 9.1 10*3/uL — ABNORMAL HIGH (ref 1.4–7.0)
Neutrophils: 78 %
Platelets: 284 10*3/uL (ref 150–450)
RBC: 4.96 x10E6/uL (ref 3.77–5.28)
RDW: 16.7 % — ABNORMAL HIGH (ref 11.7–15.4)
WBC: 11.7 10*3/uL — ABNORMAL HIGH (ref 3.4–10.8)

## 2021-12-17 LAB — CMP14+EGFR
ALT: 13 IU/L (ref 0–32)
AST: 11 IU/L (ref 0–40)
Albumin/Globulin Ratio: 2.1 (ref 1.2–2.2)
Albumin: 4.2 g/dL (ref 3.9–4.9)
Alkaline Phosphatase: 74 IU/L (ref 44–121)
BUN/Creatinine Ratio: 22 (ref 12–28)
BUN: 16 mg/dL (ref 8–27)
Bilirubin Total: 0.3 mg/dL (ref 0.0–1.2)
CO2: 24 mmol/L (ref 20–29)
Calcium: 8.8 mg/dL (ref 8.7–10.3)
Chloride: 106 mmol/L (ref 96–106)
Creatinine, Ser: 0.73 mg/dL (ref 0.57–1.00)
Globulin, Total: 2 g/dL (ref 1.5–4.5)
Glucose: 101 mg/dL — ABNORMAL HIGH (ref 70–99)
Potassium: 4.3 mmol/L (ref 3.5–5.2)
Sodium: 142 mmol/L (ref 134–144)
Total Protein: 6.2 g/dL (ref 6.0–8.5)
eGFR: 90 mL/min/{1.73_m2} (ref >59)

## 2021-12-17 LAB — LIPID PANEL
Chol/HDL Ratio: 3 ratio (ref 0.0–4.4)
Cholesterol, Total: 157 mg/dL (ref 100–199)
HDL: 53 mg/dL (ref >39)
LDL Chol Calc (NIH): 86 mg/dL (ref 0–99)
Triglycerides: 101 mg/dL (ref 0–149)
VLDL Cholesterol Cal: 18 mg/dL (ref 5–40)

## 2021-12-17 NOTE — Progress Notes (Signed)
Hello [name],  Your lab result is normal and/or stable.Some minor variations that are not significant are commonly marked abnormal, but do not represent any medical problem for you.  Best regards, Claretta Fraise, M.D.

## 2022-01-04 ENCOUNTER — Other Ambulatory Visit (HOSPITAL_COMMUNITY): Payer: Self-pay | Admitting: Psychiatry

## 2022-01-04 DIAGNOSIS — F332 Major depressive disorder, recurrent severe without psychotic features: Secondary | ICD-10-CM

## 2022-01-04 DIAGNOSIS — F411 Generalized anxiety disorder: Secondary | ICD-10-CM

## 2022-01-16 DIAGNOSIS — M6281 Muscle weakness (generalized): Secondary | ICD-10-CM | POA: Diagnosis not present

## 2022-01-21 ENCOUNTER — Telehealth (INDEPENDENT_AMBULATORY_CARE_PROVIDER_SITE_OTHER): Payer: Medicare Other | Admitting: Psychiatry

## 2022-01-21 ENCOUNTER — Encounter (HOSPITAL_COMMUNITY): Payer: Self-pay | Admitting: Psychiatry

## 2022-01-21 DIAGNOSIS — F411 Generalized anxiety disorder: Secondary | ICD-10-CM | POA: Diagnosis not present

## 2022-01-21 DIAGNOSIS — F332 Major depressive disorder, recurrent severe without psychotic features: Secondary | ICD-10-CM | POA: Diagnosis not present

## 2022-01-21 MED ORDER — DULOXETINE HCL 60 MG PO CPEP: 60.0000 mg | ORAL_CAPSULE | Freq: Two times a day (BID) | ORAL | 2 refills | Status: DC

## 2022-01-21 MED ORDER — CLONAZEPAM 1 MG PO TABS: 1.0000 mg | ORAL_TABLET | Freq: Three times a day (TID) | ORAL | 2 refills | Status: DC | PRN

## 2022-01-21 NOTE — Progress Notes (Signed)
Virtual Visit via Telephone Note  I connected with [name] on 01/21/22 at  1:40 PM EST by telephone and verified that I am speaking with the correct person using two identifiers.  Location: Patient: home Provider: office   I discussed the limitations, risks, security and privacy concerns of performing an evaluation and management service by telephone and the availability of in person appointments. I also discussed with the patient that there may be a patient responsible charge related to this service. The patient expressed understanding and agreed to proceed.      I discussed the assessment and treatment plan with the patient. The patient was provided an opportunity to ask questions and all were answered. The patient agreed with the plan and demonstrated an understanding of the instructions.   The patient was advised to call back or seek an in-person evaluation if the symptoms worsen or if the condition fails to improve as anticipated.  I provided 15 minutes of non-face-to-face time during this encounter.   [name], MD  [facility] MD/PA/NP OP Progress Note  01/21/2022 1:54 PM [name]  MRN:  [id number]  Chief Complaint:  Chief Complaint  Patient presents with   Depression   Anxiety   Follow-up   HPI: This patient is a 66 year old female who is widowed and lives alone in [place].  She is on disability.  She returns after 3 months regarding depression and anxiety   The patient returns for follow-up after 3 months regarding her anxiety and depression.  She states that things are about the same.  She is still having a lot of chronic pain in her shoulder.  She cannot do surgery until she quit smoking.  She is still at 1 pack a day.  She has had a hard time cutting it below that.  She worries a lot about her kids but right now they are off drugs.  She states her mood has been stable and she denies thoughts of self-harm or suicidal ideation.  The clonazepam continues to help with  her anxiety.  She is sleeping fairly well. Visit Diagnosis:    ICD-10-CM   1. GAD (generalized anxiety disorder)  F41.1 clonazePAM (KLONOPIN) 1 MG tablet    2. Severe episode of recurrent major depressive disorder, without psychotic features (Uncertain)  F33.2 DULoxetine (CYMBALTA) 60 MG capsule      Past Psychiatric History: none  Past Medical History:  Past Medical History:  Diagnosis Date   Allergy    Arthritis    Atypical mole 06/05/2003   slight-mod-Left scapula (WS)   Atypical nevus 07/26/2003   persist. dysp- Left scapula (WS)   Basal cell carcinoma 06/05/2003   RIght chest (CX35FU)   Basal cell carcinoma 05/22/2004   superificial- RIght upper back- (CX35FU)   Basal cell carcinoma 11/27/2004   beside R nostril-(MOHS), below right outer nose (MOHS)   Basal cell carcinoma 11/08/2009   left post shoulder -(txpbx)   Basal cell carcinoma 05/24/2012   ulcerated- Left post shoulder- (CX35FU), ulcerated-mid forehead (EXC )   Basal cell carcinoma 06/24/2012   superificial- Right shoulder - (txpbx)    Basal cell carcinoma 07/29/2012    sclerosis- mid forehead (MOHS)   Basal cell carcinoma 06/06/2019   nod-right anterior neck (CX35FU)   Cataract    Closed compression fracture of L5 vertebra (HCC)    Constipation    uses OTC meds with help   COPD (chronic obstructive pulmonary disease) (HCC)    Coronary artery disease  minimal nonobstructive    COVID    Depression    GERD (gastroesophageal reflux disease)    Heart murmur    Hx of adenomatous polyp of colon 10/03/2014   Hypercholesteremia    Hypertension    Panic attacks    Perimenopausal    Skin cancer    Squamous cell carcinoma of skin 06/05/2003   Right [place](CX35FU)   Squamous cell carcinoma of skin 05/22/2004   in situ- Left upperarm (CX35FU)   Squamous cell carcinoma of skin 11/27/2004   in situ- above Left elbow (CX35FU)   Squamous cell carcinoma of skin 03/04/2006   in situ- Left sideburn (CX35FU)    Squamous cell carcinoma of skin 11/26/2006   in situ- mid brow (Cx35FU)   Squamous cell carcinoma of skin 05/24/2012   in situ- Left chest- (CX35FU)   Squamous cell carcinoma of skin 04/05/2013   in situ- Left nose (Cx35FU)   Squamous cell carcinoma of skin 08/17/2013   well diff-above Left eye (txpbx)   Squamous cell carcinoma of skin 07/30/2016   in situ- right chest sup (Cx35FU)   Squamous cell carcinoma of skin 05/12/2017   in situ- RIght shoulder (CX35FU), in situ- Left forehead (CX35FU)   Squamous cell carcinoma of skin 11/23/2018   in situ- Right mid shin, inner- (MOHS)   Tobacco user     Past Surgical History:  Procedure Laterality Date   ABDOMINAL HYSTERECTOMY     CATARACT EXTRACTION W/PHACO Right 03/01/2021   Procedure: CATARACT EXTRACTION PHACO AND INTRAOCULAR LENS PLACEMENT (IOC);  Surgeon: Baruch Goldmann, MD;  Location: [facility];  Service: Ophthalmology;  Laterality: Right;  CDE: 7.31   CATARACT EXTRACTION W/PHACO Left 05/16/2021   Procedure: CATARACT EXTRACTION PHACO AND INTRAOCULAR LENS PLACEMENT (IOC);  Surgeon: Baruch Goldmann, MD;  Location: [facility];  Service: Ophthalmology;  Laterality: Left;  CDE 9.41    COLONOSCOPY     IR VERTEBROPLASTY CERV/THOR BX INC UNI/BIL INC/INJECT/IMAGING  04/16/2021   Right foot toe surgery     UPPER GASTROINTESTINAL ENDOSCOPY     vocal cord     polyp removal    Family Psychiatric History: See below  Family History:  Family History  Problem Relation Age of Onset   Heart disease Mother    Heart attack Mother    Heart attack Father    Colon polyps Sister    Esophageal cancer Sister    Anxiety disorder Sister    Depression Sister    Colon polyps Sister    Heart attack Brother    Colon cancer Brother 47       died at age 96   Anxiety disorder Brother    Depression Brother    Alcohol abuse Brother    Colon polyps Brother    Colon polyps Brother    Drug abuse Son    Breast cancer Niece    Rectal cancer Neg Hx    Stomach cancer  Neg Hx     Social History:  Social History   Socioeconomic History   Marital status: Single    Spouse name: Not on file   Number of children: 2   Years of education: Not on file   Highest education level: Not on file  Occupational History   Occupation: Scientist, 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   Sexual activity: Not on file  Other Topics Concern   Not on file  Social History Narrative   Retired/ lives alone in one level home   Son and daughter in law that live next door and help her out a lot         Social Determinants of Health   Financial Resource Strain: High Risk (04/08/2021)   Overall Financial Resource Strain (CARDIA)    Difficulty of Paying Living Expenses: Very hard  Food Insecurity: No Food Insecurity (04/08/2021)   Hunger Vital Sign    Worried About Running Out of Food in the Last Year: Never true    [place] in the Last Year: Never true  Transportation Needs: No Transportation Needs (04/08/2021)   PRAPARE - Hydrologist (Medical): No    Lack of Transportation (Non-Medical): No  Physical Activity: Inactive (04/08/2021)   Exercise Vital Sign    Days of Exercise per Week: 0 days    Minutes of Exercise per Session: 0 min  Stress: Stress Concern Present (04/08/2021)   Pax    Feeling of Stress : To some extent  Social Connections: Socially Isolated (04/08/2021)   Social Connection and Isolation Panel [NHANES]     Frequency of Communication with Friends and Family: More than three times a week    Frequency of Social Gatherings with Friends and Family: More than three times a week    Attends Religious Services: Never    Marine scientist or Organizations: No    Attends Archivist Meetings: Never    Marital Status: Divorced    Allergies:  Allergies  Allergen Reactions   Lorcet 10-650 [Hydrocodone-Acetaminophen] Itching   Sulfa Antibiotics Other (See Comments)    aching   Tape Other (See Comments)    Tear skin. Please use paper tape   Wellbutrin Xl [Bupropion Hcl Er (Xl)]     Upset stomach /bad taste in mouth    Metabolic Disorder Labs: Lab Results  Component Value Date   HGBA1C 5.9 (H) 07/12/2013   MPG 123 (H) 07/12/2013   No results found for: "PROLACTIN" Lab Results  Component Value Date   CHOL 157 12/16/2021   TRIG 101 12/16/2021   HDL 53 12/16/2021   CHOLHDL 3.0 12/16/2021   VLDL 36 07/13/2013   LDLCALC 86 12/16/2021   LDLCALC 98 05/01/2021   Lab Results  Component Value Date   TSH 1.820 05/25/2017   TSH 1.080 05/17/2015    Therapeutic Level Labs: No results found for: "LITHIUM" No results found for: "VALPROATE" No results found for: "CBMZ"  Current Medications: Current Outpatient Medications  Medication Sig Dispense Refill   acetaminophen (TYLENOL) 500 MG tablet Take 1,000 mg by mouth every 6 (six) hours as needed for moderate pain.     albuterol (VENTOLIN HFA) 108 (90 Base) MCG/ACT inhaler Inhale 2 puffs into the lungs every 6 (six) hours as needed for wheezing or shortness of breath. 8 g 5   amLODipine (NORVASC) 10 MG tablet TAKE ONE TABLET BY MOUTH DAILY  90 tablet 3   Ascorbic Acid (VITAMIN C) 1000 MG tablet Take 1,000 mg by mouth daily.     atorvastatin (LIPITOR) 40 MG tablet Take 1 tablet (40 mg total) by mouth at bedtime. 90 tablet 3   budesonide-formoterol (SYMBICORT) 160-4.5 MCG/ACT inhaler INHALE TWO PUFFS INTO THE LUNGS TWICE DAILY 10.2 g 2    cefPROZIL (CEFZIL) 500 MG tablet Take 1 tablet (500 mg total) by mouth 2 (two) times daily. For infection. Take all of this medication. 20 tablet 0   clonazePAM (KLONOPIN) 1 MG tablet Take 1 tablet (1 mg total) by mouth 3 (three) times daily as needed. for anxiety 90 tablet 2   cyclobenzaprine (FLEXERIL) 5 MG tablet Take 1 tablet (5 mg total) by mouth 3 (three) times daily as needed. 90 tablet 0   dexlansoprazole (DEXILANT) 60 MG capsule TAKE ONE CAPSULE BY MOUTH DAILY ON EMPTY STOMACH, THEN DO NOT EAT FOR ONE HOUR. (REPLACES PANTROPRAZOLE FOR REFLUX) 90 capsule 3   DULoxetine (CYMBALTA) 60 MG capsule Take 1 capsule (60 mg total) by mouth 2 (two) times daily. 60 capsule 2   fexofenadine (ALLEGRA) 180 MG tablet TAKE ONE TABLET BY MOUTH DAILY FOR ALLERGY SYMPTOMS (Patient taking differently: Take 180 mg by mouth daily.) 30 tablet 11   fluorometholone (FML) 0.1 % ophthalmic suspension SMARTSIG:In Eye(s)     gabapentin (NEURONTIN) 600 MG tablet Take 600 mg by mouth 3 (three) times daily.     ipratropium-albuterol (DUONEB) 0.5-2.5 (3) MG/3ML SOLN Take 3 mLs by nebulization every 6 (six) hours as needed. 360 mL 5   lidocaine (LIDODERM) 5 % Place 1 patch onto the skin daily. Remove & Discard patch within 12 hours or as directed by MD 30 patch 0   lisinopril (ZESTRIL) 20 MG tablet Take 1 tablet (20 mg total) by mouth daily. 90 tablet 3   mupirocin ointment (BACTROBAN) 2 % Apply 1 application. topically 2 (two) times daily. 22 g 0   naproxen sodium (ALEVE) 220 MG tablet Take 220 mg by mouth 2 (two) times daily as needed (pain).     oxyCODONE (ROXICODONE) 15 MG immediate release tablet Take 15 mg by mouth 4 (four) times daily as needed.     predniSONE (DELTASONE) 10 MG tablet Take 5 daily for 2 days followed by 4,3,2 and 1 for 2 days each. 30 tablet 0   rOPINIRole (REQUIP) 2 MG tablet Take 1 tablet (2 mg total) by mouth at bedtime. For leg cramps 90 tablet 1   Tiotropium Bromide Monohydrate (SPIRIVA RESPIMAT)  2.5 MCG/ACT AERS INHALE TWO PUFFS INTO THE LUNGS DAILY (NEEDS TO BE SEEN BEFORE NEXT REFILL) 4 g 5   Vitamin D, Ergocalciferol, (DRISDOL) 1.25 MG (50000 UNIT) CAPS capsule TAKE ONE CAPSULE BY MOUTH ONCE WEEKLY 13 capsule 3   No current facility-administered medications for this visit.     Musculoskeletal: Strength & Muscle Tone: na Gait & Station: na Patient leans: N/A  Psychiatric Specialty Exam: Review of Systems  Respiratory:  Positive for shortness of breath.   Musculoskeletal:  Positive for arthralgias.  All other systems reviewed and are negative.   There were no vitals taken for this visit.There is no height or weight on file to calculate BMI.  General Appearance: NA  Eye Contact:  NA  Speech:  Clear and Coherent  Volume:  Normal  Mood:  Euthymic  Affect:  NA  Thought Process:  Goal Directed  Orientation:  Full (Time, Place, and Person)  Thought Content: Rumination  Suicidal Thoughts:  No  Homicidal Thoughts:  No  Memory:  Immediate;   Good Recent;   Good Remote;   Fair  Judgement:  Fair  Insight:  Fair  Psychomotor Activity:  Decreased  Concentration:  Concentration: Good and Attention Span: Good  Recall:  Good  Fund of Knowledge: Good  Language: Good  Akathisia:  No  Handed:  Right  AIMS (if indicated): not done  Assets:  Communication Skills Desire for Improvement Resilience Social Support  ADL's:  Intact  Cognition: WNL  Sleep:  Good   Screenings: GAD-7    Flowsheet Row Office Visit from 12/16/2021 in [place] Visit from 06/17/2021 in [place] Visit from 05/01/2021 in [place] Office Visit from 04/08/2021 in [place] Visit from 08/28/2020 in [employer]  Total GAD-7 Score '16 5 7 5 7      '$ PHQ2-9    [place] Office Visit from 12/16/2021 in [place] Video Visit from 10/21/2021  in [place] Video Visit from 07/22/2021 in [place] Office Visit from 06/17/2021 in [place] Office Visit from 05/01/2021 in [place]  PHQ-2 Total Score 4 4 0 2 2  PHQ-9 Total Score 10 8 -- 7 7      Flowsheet Row Video Visit from 10/21/2021 in [facility] Video Visit from 07/22/2021 in [place] Admission (Discharged) from 05/16/2021 in [place] No Risk No Risk No Risk        Assessment and Plan: This patient is a 66 year old female with a history of depression and anxiety.  She seems stable on her current regimen.  She will continue Cymbalta 60 mg twice daily for depression and clonazepam 1 mg 3 times daily for anxiety.  She will return to see me in 3 months  Collaboration of Care: Collaboration of Care: Primary Care Provider AEB notes are shared with PCP on the epic system  Patient/Guardian was advised Release of Information must be obtained prior to any record release in order to collaborate their care with an outside provider. Patient/Guardian was advised if they have not already done so to contact the registration department to sign all necessary forms in order for [place] to release information regarding their care.   Consent: Patient/Guardian gives verbal consent for treatment and assignment of benefits for services provided during this visit. Patient/Guardian expressed understanding and agreed to proceed.    [name], MD 01/21/2022, 1:54 PM

## 2022-02-01 ENCOUNTER — Other Ambulatory Visit: Payer: Self-pay | Admitting: Cardiology

## 2022-02-15 DIAGNOSIS — M6281 Muscle weakness (generalized): Secondary | ICD-10-CM | POA: Diagnosis not present

## 2022-03-07 ENCOUNTER — Telehealth: Payer: Self-pay | Admitting: Cardiology

## 2022-03-07 NOTE — Telephone Encounter (Signed)
Pt c/o BP issue: STAT if pt c/o blurred vision, one-sided weakness or slurred speech  1. What are your last 5 BP readings?  160/90 (today) 195/98 (Sunday and Monday)  2. Are you having any other symptoms (ex. Dizziness, headache, blurred vision, passed out)?  Headache, dizziness, shakiness in legs  3. What is your BP issue?  Patient states her BP has been elevated all week

## 2022-03-07 NOTE — Telephone Encounter (Signed)
Reports elevated BP all week. Now BP 167/96 & HR 97. Reports dizziness and SOB. Reports falling a few nights ago due to leg weakness and shaking in legs. Reports headaches rated 7/10 that she's had all week. Says she took aleve and oxycodone and headache pain did not improve. Says she took goody powder and it improved headache some. Denies blurred vision, n/v. Reports intermittent chest pain rated 6/10 that she's had all week. Denies active chest pain. Advised that she needed to go to the ED for an evaluation with all the symptoms she is describing. Verbalized understanding of plan.

## 2022-03-20 DIAGNOSIS — M5459 Other low back pain: Secondary | ICD-10-CM | POA: Diagnosis not present

## 2022-03-20 DIAGNOSIS — M5412 Radiculopathy, cervical region: Secondary | ICD-10-CM | POA: Diagnosis not present

## 2022-03-21 DIAGNOSIS — M6281 Muscle weakness (generalized): Secondary | ICD-10-CM | POA: Diagnosis not present

## 2022-03-24 ENCOUNTER — Ambulatory Visit (HOSPITAL_COMMUNITY)
Admission: RE | Admit: 2022-03-24 | Discharge: 2022-03-24 | Disposition: A | Discharge status: Home / Self Care | Payer: 59 | Source: Ambulatory Visit | Attending: Family Medicine | Admitting: Family Medicine

## 2022-03-24 DIAGNOSIS — F17210 Nicotine dependence, cigarettes, uncomplicated: Secondary | ICD-10-CM | POA: Diagnosis not present

## 2022-03-24 DIAGNOSIS — Z87891 Personal history of nicotine dependence: Secondary | ICD-10-CM | POA: Diagnosis not present

## 2022-03-25 ENCOUNTER — Telehealth: Payer: Self-pay | Admitting: Acute Care

## 2022-03-25 NOTE — Telephone Encounter (Signed)
Received call report from [facility]iology on patient's lung cancer screen CT done on 03/24/22. Sarah please review the result/impression copied below:  IMPRESSION: 1. Ground-glass nodule of the right upper lobe has doubled in size when compared with prior lung cancer screening CT and is suspicious for primary lung adenocarcinoma. Lung-RADS 4X, highly suspicious. Additional imaging evaluation or consultation with Pulmonology or Thoracic Surgery recommended. 2. Aortic Atherosclerosis (ICD10-I70.0) and Emphysema (ICD10-J43.9).  Please advise, thank you.   **Also routing to lung nodule pool**

## 2022-03-25 NOTE — Telephone Encounter (Signed)
I have attempted to call the patient with the results of their  Low Dose CT Chest Lung cancer screening scan. There was no answer. I have left a HIPPA compliant VM requesting the patient call the office for the scan results. I included the office contact information in the message. We will await his return call. If no return call we will continue to call until patient is contacted.

## 2022-03-25 NOTE — Telephone Encounter (Signed)
I have attempted to call the patient with the results of their  Low Dose CT Chest Lung cancer screening scan. There was no answer. I have left a HIPPA compliant VM requesting the patient call the office for the scan results. I included the office contact information in the message. We will await their return call. If no return call we will continue to call until patient is contacted.    Langley Gauss, this is a 4X. I will try again later today. If she calls back can you let me know so I can catch her. Thanks so much I will be ordering a PET scan once I speak with her. She will need follow up after the PET with either myself or Dr. Valeta Harms.

## 2022-03-25 NOTE — Telephone Encounter (Signed)
Call report for this PT for NP Groce:  [phone]  Pls call

## 2022-03-26 ENCOUNTER — Other Ambulatory Visit: Payer: Self-pay | Admitting: Acute Care

## 2022-03-26 ENCOUNTER — Telehealth: Payer: Self-pay | Admitting: Acute Care

## 2022-03-26 DIAGNOSIS — R911 Solitary pulmonary nodule: Secondary | ICD-10-CM

## 2022-03-26 NOTE — Telephone Encounter (Signed)
I have called the patient with the results of her low dose CT Chest. I explained that her scan was read as a LR 4 X. There has been growth of a ground glass nodule from 5.4 mm to 10.1 mm in the last year.  I explained that I would like to do a PET scan to take a better look at the nodule, and determine next best steps . She is in agreement with this plan.  Langley Gauss, I have ordered the PET scan. Please fax results to PCP and let him know plan is PET scan and follow up with [place]. Leave on tickle list in the event that the PET is negative. Thanks so much

## 2022-03-26 NOTE — Telephone Encounter (Signed)
Patient is returning phone call. Patient phone number is [phone].

## 2022-03-27 NOTE — Telephone Encounter (Signed)
Results/plan faxed to PCP

## 2022-03-28 ENCOUNTER — Telehealth: Payer: Self-pay | Admitting: Family Medicine

## 2022-03-28 ENCOUNTER — Other Ambulatory Visit: Payer: Self-pay | Admitting: Family Medicine

## 2022-03-28 ENCOUNTER — Other Ambulatory Visit (HOSPITAL_COMMUNITY): Payer: Self-pay | Admitting: Psychiatry

## 2022-03-28 DIAGNOSIS — F332 Major depressive disorder, recurrent severe without psychotic features: Secondary | ICD-10-CM

## 2022-03-28 DIAGNOSIS — I10 Essential (primary) hypertension: Secondary | ICD-10-CM

## 2022-03-28 DIAGNOSIS — E782 Mixed hyperlipidemia: Secondary | ICD-10-CM

## 2022-03-28 NOTE — Telephone Encounter (Signed)
Pt has been informed and understood. 

## 2022-03-28 NOTE — Telephone Encounter (Signed)
Yes Spiriva and Symbicort can be taken together, 1 is dual therapy and the other is a completely different type of inhaler so that would be considered triple therapy having them both together so yes that is very valid to have them both together

## 2022-03-28 NOTE — Telephone Encounter (Signed)
See telephone note 03/26/22

## 2022-03-28 NOTE — Telephone Encounter (Signed)
See telephone note 1/24//24

## 2022-04-03 ENCOUNTER — Ambulatory Visit (HOSPITAL_COMMUNITY)
Admission: RE | Admit: 2022-04-03 | Discharge: 2022-04-03 | Disposition: A | Discharge status: Home / Self Care | Payer: 59 | Source: Ambulatory Visit | Attending: Acute Care | Admitting: Acute Care

## 2022-04-03 DIAGNOSIS — R911 Solitary pulmonary nodule: Secondary | ICD-10-CM

## 2022-04-03 DIAGNOSIS — D3502 Benign neoplasm of left adrenal gland: Secondary | ICD-10-CM | POA: Diagnosis not present

## 2022-04-03 DIAGNOSIS — J432 Centrilobular emphysema: Secondary | ICD-10-CM | POA: Diagnosis not present

## 2022-04-03 DIAGNOSIS — K5730 Diverticulosis of large intestine without perforation or abscess without bleeding: Secondary | ICD-10-CM | POA: Diagnosis not present

## 2022-04-03 DIAGNOSIS — N200 Calculus of kidney: Secondary | ICD-10-CM | POA: Diagnosis not present

## 2022-04-03 MED ORDER — FLUDEOXYGLUCOSE F - 18 (FDG) INJECTION
7.8900 | Freq: Once | INTRAVENOUS | Status: AC | PRN
  Administered 2022-04-03: 7.89 via INTRAVENOUS

## 2022-04-08 ENCOUNTER — Telehealth: Payer: Self-pay | Admitting: Acute Care

## 2022-04-10 ENCOUNTER — Telehealth: Payer: Self-pay | Admitting: Acute Care

## 2022-04-10 DIAGNOSIS — R911 Solitary pulmonary nodule: Secondary | ICD-10-CM

## 2022-04-10 DIAGNOSIS — Z87891 Personal history of nicotine dependence: Secondary | ICD-10-CM

## 2022-04-10 NOTE — Telephone Encounter (Signed)
I have called the patient with the results of her PET scan. I explained that her scan showed no areas of concerning hypermetabolism. Recommendation is for a 6 month LDCT as follow up. She verbalized understanding. Langley Gauss, please order 6 month follow up low dose CT Chest, and fax results to PCP with plan for 6 month follow up scan. Thanks so much

## 2022-04-10 NOTE — Telephone Encounter (Signed)
PET results faxed to PCP with follow up plan included. Order placed for 6 mth nodule follow up chest CT.

## 2022-04-15 ENCOUNTER — Ambulatory Visit: Payer: 59 | Admitting: Acute Care

## 2022-04-16 ENCOUNTER — Ambulatory Visit (INDEPENDENT_AMBULATORY_CARE_PROVIDER_SITE_OTHER): Payer: 59

## 2022-04-16 VITALS — Ht 65.0 in | Wt 147.0 lb

## 2022-04-16 DIAGNOSIS — Z0000 Encounter for general adult medical examination without abnormal findings: Secondary | ICD-10-CM | POA: Diagnosis not present

## 2022-04-16 NOTE — Progress Notes (Signed)
Subjective:   [name] is a 67 y.o. female who presents for Medicare Annual (Subsequent) preventive examination.  I connected with  [name] on 04/16/22 by a audio enabled telemedicine application and verified that I am speaking with the correct person using two identifiers.  Patient Location: Home  Provider Location: Home Office  I discussed the limitations of evaluation and management by telemedicine. The patient expressed understanding and agreed to proceed.  Review of Systems     Cardiac Risk Factors include: sedentary lifestyle;smoking/ tobacco exposure;advanced age (>65mn, >>53women);hypertension     Objective:    Today's Vitals   04/16/22 1957  Weight: 147 lb (66.7 kg)  Height: 5' 5"$  (1.651 m)   Body mass index is 24.46 kg/m.     04/16/2022    8:08 PM 04/16/2021   10:37 AM 04/08/2021    1:27 PM 03/29/2021   12:38 AM 03/28/2021   12:34 PM 03/15/2021   11:12 AM 03/01/2021    7:30 AM  Advanced Directives  Does Patient Have a Medical Advance Directive? No No No  No No No  Would patient like information on creating a medical advance directive? Yes (MAU/Ambulatory/Procedural Areas - Information given) No - Patient declined No - Patient declined No - Patient declined  No - Patient declined No - Patient declined    Current Medications (verified) Outpatient Encounter Medications as of 04/16/2022  Medication Sig   acetaminophen (TYLENOL) 500 MG tablet Take 1,000 mg by mouth every 6 (six) hours as needed for moderate pain.   albuterol (VENTOLIN HFA) 108 (90 Base) MCG/ACT inhaler Inhale 2 puffs into the lungs every 6 (six) hours as needed for wheezing or shortness of breath.   amLODipine (NORVASC) 10 MG tablet TAKE ONE TABLET BY MOUTH DAILY   Ascorbic Acid (VITAMIN C) 1000 MG tablet Take 1,000 mg by mouth daily.   atorvastatin (LIPITOR) 40 MG tablet TAKE ONE TABLET BY MOUTH AT BEDTIME   budesonide-formoterol (SYMBICORT) 160-4.5 MCG/ACT inhaler INHALE TWO PUFFS INTO  THE LUNGS TWICE DAILY   clonazePAM (KLONOPIN) 1 MG tablet Take 1 tablet (1 mg total) by mouth 3 (three) times daily as needed. for anxiety   cyclobenzaprine (FLEXERIL) 5 MG tablet Take 1 tablet (5 mg total) by mouth 3 (three) times daily as needed.   dexlansoprazole (DEXILANT) 60 MG capsule TAKE ONE CAPSULE BY MOUTH DAILY ON EMPTY STOMACH, THEN DO NOT EAT FOR ONE HOUR. (REPLACES PANTROPRAZOLE FOR REFLUX)   DULoxetine (CYMBALTA) 60 MG capsule TAKE ONE CAPSULE BY MOUTH TWICE DAILY   fexofenadine (ALLEGRA) 180 MG tablet TAKE ONE TABLET BY MOUTH DAILY FOR ALLERGY SYMPTOMS (Patient taking differently: Take 180 mg by mouth daily.)   fluorometholone (FML) 0.1 % ophthalmic suspension SMARTSIG:In Eye(s)   gabapentin (NEURONTIN) 600 MG tablet Take 600 mg by mouth 3 (three) times daily.   ipratropium-albuterol (DUONEB) 0.5-2.5 (3) MG/3ML SOLN Take 3 mLs by nebulization every 6 (six) hours as needed.   lidocaine (LIDODERM) 5 % Place 1 patch onto the skin daily. Remove & Discard patch within 12 hours or as directed by MD   lisinopril (ZESTRIL) 20 MG tablet TAKE ONE TABLET BY MOUTH ONCE DAILY   mupirocin ointment (BACTROBAN) 2 % Apply 1 application. topically 2 (two) times daily.   naproxen sodium (ALEVE) 220 MG tablet Take 220 mg by mouth 2 (two) times daily as needed (pain).   oxyCODONE (ROXICODONE) 15 MG immediate release tablet Take 15 mg by mouth 4 (four) times daily as needed.  rOPINIRole (REQUIP) 2 MG tablet Take 1 tablet (2 mg total) by mouth at bedtime. For leg cramps   Tiotropium Bromide Monohydrate (SPIRIVA RESPIMAT) 2.5 MCG/ACT AERS INHALE TWO PUFFS INTO THE LUNGS DAILY (NEEDS TO BE SEEN BEFORE NEXT REFILL)   Vitamin D, Ergocalciferol, (DRISDOL) 1.25 MG (50000 UNIT) CAPS capsule TAKE ONE CAPSULE BY MOUTH ONCE WEEKLY   [DISCONTINUED] cefPROZIL (CEFZIL) 500 MG tablet Take 1 tablet (500 mg total) by mouth 2 (two) times daily. For infection. Take all of this medication.   [DISCONTINUED] predniSONE  (DELTASONE) 10 MG tablet Take 5 daily for 2 days followed by 4,3,2 and 1 for 2 days each.   No facility-administered encounter medications on file as of 04/16/2022.    Allergies (verified) Lorcet 10-650 [hydrocodone-acetaminophen], Sulfa antibiotics, Tape, and Wellbutrin xl [bupropion hcl er (xl)]   History: Past Medical History:  Diagnosis Date   Allergy    Arthritis    Atypical mole 06/05/2003   slight-mod-Left scapula (WS)   Atypical nevus 07/26/2003   persist. dysp- Left scapula (WS)   Basal cell carcinoma 06/05/2003   RIght chest (CX35FU)   Basal cell carcinoma 05/22/2004   superificial- RIght upper back- (CX35FU)   Basal cell carcinoma 11/27/2004   beside R nostril-(MOHS), below right outer nose (MOHS)   Basal cell carcinoma 11/08/2009   left post shoulder -(txpbx)   Basal cell carcinoma 05/24/2012   ulcerated- Left post shoulder- (CX35FU), ulcerated-mid forehead (EXC )   Basal cell carcinoma 06/24/2012   superificial- Right shoulder - (txpbx)    Basal cell carcinoma 07/29/2012    sclerosis- mid forehead (MOHS)   Basal cell carcinoma 06/06/2019   nod-right anterior neck (CX35FU)   Cataract    Closed compression fracture of L5 vertebra (HCC)    Constipation    uses OTC meds with help   COPD (chronic obstructive pulmonary disease) (HCC)    Coronary artery disease    minimal nonobstructive    COVID    Depression    GERD (gastroesophageal reflux disease)    Heart murmur    Hx of adenomatous polyp of colon 10/03/2014   Hypercholesteremia    Hypertension    Panic attacks    Perimenopausal    Skin cancer    Squamous cell carcinoma of skin 06/05/2003   Right Temple(CX35FU)   Squamous cell carcinoma of skin 05/22/2004   in situ- Left upperarm (CX35FU)   Squamous cell carcinoma of skin 11/27/2004   in situ- above Left elbow (CX35FU)   Squamous cell carcinoma of skin 03/04/2006   in situ- Left sideburn (CX35FU)   Squamous cell carcinoma of skin 11/26/2006   in  situ- mid brow (Cx35FU)   Squamous cell carcinoma of skin 05/24/2012   in situ- Left chest- (CX35FU)   Squamous cell carcinoma of skin 04/05/2013   in situ- Left nose (Cx35FU)   Squamous cell carcinoma of skin 08/17/2013   well diff-above Left eye (txpbx)   Squamous cell carcinoma of skin 07/30/2016   in situ- right chest sup (Cx35FU)   Squamous cell carcinoma of skin 05/12/2017   in situ- RIght shoulder (CX35FU), in situ- Left forehead (CX35FU)   Squamous cell carcinoma of skin 11/23/2018   in situ- Right mid shin, inner- (MOHS)   Tobacco user    Past Surgical History:  Procedure Laterality Date   ABDOMINAL HYSTERECTOMY     CATARACT EXTRACTION W/PHACO Right 03/01/2021   Procedure: CATARACT EXTRACTION PHACO AND INTRAOCULAR LENS PLACEMENT (IOC);  Surgeon: Baruch Goldmann, MD;  Location: [facility];  Service: Ophthalmology;  Laterality: Right;  CDE: 7.31   CATARACT EXTRACTION W/PHACO Left 05/16/2021   Procedure: CATARACT EXTRACTION PHACO AND INTRAOCULAR LENS PLACEMENT (IOC);  Surgeon: Baruch Goldmann, MD;  Location: [facility];  Service: Ophthalmology;  Laterality: Left;  CDE 9.41    COLONOSCOPY     IR VERTEBROPLASTY CERV/THOR BX INC UNI/BIL INC/INJECT/IMAGING  04/16/2021   Right foot toe surgery     UPPER GASTROINTESTINAL ENDOSCOPY     vocal cord     polyp removal   Family History  Problem Relation Age of Onset   Heart disease Mother    Heart attack Mother    Heart attack Father    Colon polyps Sister    Esophageal cancer Sister    Anxiety disorder Sister    Depression Sister    Colon polyps Sister    Heart attack Brother    Colon cancer Brother 67       died at age 30   Anxiety disorder Brother    Depression Brother    Alcohol abuse Brother    Colon polyps Brother    Colon polyps Brother    Drug abuse Son    Breast cancer Niece    Rectal cancer Neg Hx    Stomach cancer Neg Hx    Social History   Socioeconomic History   Marital status: Single    Spouse name: Not on file    Number of children: 2   Years of education: Not on file   Highest education level: Not on file  Occupational History   Occupation: Scientist, 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   Sexual activity: Not on file  Other Topics Concern   Not on file  Social History Narrative   Retired/ lives alone in one level home   Son and daughter in law that live next door and help her out a lot         Social Determinants of Health   Financial Resource Strain: Medium Risk (04/16/2022)   Overall Financial Resource Strain (CARDIA)    Difficulty of Paying Living Expenses: Somewhat hard  Food Insecurity: No Food Insecurity (04/16/2022)   Hunger Vital Sign    Worried About Running Out of Food in the Last Year: Never true    Ran Out of Food in the Last Year: Never true  Transportation Needs: No Transportation Needs (04/16/2022)   PRAPARE - Hydrologist (Medical): No    Lack of Transportation (Non-Medical): No  Physical Activity: Inactive (04/16/2022)   Exercise Vital Sign    Days of Exercise per Week: 0 days    Minutes of Exercise per Session: 0 min  Stress: Stress Concern Present (04/16/2022)   [place]    Feeling of Stress : To some extent  Social Connections: Socially Isolated (04/16/2022)   Social Connection  and Isolation Panel [NHANES]    Frequency of Communication with Friends and Family: More than three times a week    Frequency of Social Gatherings with  Friends and Family: More than three times a week    Attends Religious Services: Never    Marine scientist or Organizations: No    Attends Music therapist: Never    Marital Status: Divorced    Tobacco Counseling Ready to quit: Not Answered Counseling given: Not Answered   Clinical Intake:  Pre-visit preparation completed: Yes  Pain : No/denies pain  Diabetes: No  How often do you need to have someone help you when you read instructions, pamphlets, or other written materials from your doctor or pharmacy?: 1 - Never  Diabetic?No   Interpreter Needed?: No  Information entered by :: Denman George LPN   Activities of Daily Living    04/16/2022    8:08 PM  In your present state of health, do you have any difficulty performing the following activities:  Hearing? 0  Vision? 0  Difficulty concentrating or making decisions? 0  Walking or climbing stairs? 1  Dressing or bathing? 0  Doing errands, shopping? 1  Preparing Food and eating ? N  Using the Toilet? N  In the past six months, have you accidently leaked urine? N  Do you have problems with loss of bowel control? N  Managing your Medications? N  Managing your Finances? N  Housekeeping or managing your Housekeeping? Y    Patient Care Team: Claretta Fraise, MD as PCP - General (Family Medicine) Harl Bowie, Alphonse Guild, MD as PCP - Cardiology (Cardiology) Gatha Mayer, MD as Consulting Physician (Gastroenterology) Cloria Spring, MD as Consulting Physician ([place]) [name], MD (Inactive) as Consulting Physician (Dermatology) Marshell Garfinkel, MD as Consulting Physician (Pulmonary Disease) Mordecai Rasmussen, MD as Consulting Physician (Orthopedic Surgery) Boyd Kerbs, MD as Referring Physician (Neurosurgery) Baruch Goldmann, MD as Consulting Physician (Ophthalmology) Leticia Clas, OD (Optometry)  Indicate any recent Medical Services you may have received from other than [facility]  providers in the past year (date may be approximate).     Assessment:   This is a routine wellness examination for [facility].  Hearing/Vision screen Hearing Screening - Comments:: Denies hearing difficulties  Vision Screening - Comments::  up to date with routine eye exams with Dr. Leticia Clas    Dietary issues and exercise activities discussed: Current Exercise Habits: The patient does not participate in regular exercise at present, Exercise limited by: respiratory conditions(s)   Goals Addressed             This Visit's Progress    COMPLETED: AWV       04/02/2020 AWV Goal: Exercise for General Health  Patient will verbalize understanding of the benefits of increased physical activity: Exercising regularly is important. It will improve your overall fitness, flexibility, and endurance. Regular exercise also will improve your overall health. It can help you control your weight, reduce stress, and improve your bone density. Over the next year, patient will increase physical activity as tolerated with a goal of at least 150 minutes of moderate physical activity per week.  You can tell that you are exercising at a moderate intensity if your heart starts beating faster and you start breathing faster but can still hold a conversation. Moderate-intensity exercise ideas include: Walking 1 mile (1.6 km) in about 15 minutes Biking Hiking Golfing Dancing Water aerobics Patient will verbalize understanding of everyday activities that increase physical  activity by providing examples like the following: Yard work, such as: Sales promotion account executive Gardening Washing windows or floors Patient will be able to explain general safety guidelines for exercising:  Before you start a new exercise program, talk with your health care provider. Do not exercise so much that you hurt yourself, feel dizzy, or get very short of  breath. Wear comfortable clothes and wear shoes with good support. Drink plenty of water while you exercise to prevent dehydration or heat stroke. Work out until your breathing and your heartbeat get faster.      Prevent falls       Quit Smoking        Depression Screen    04/16/2022    8:03 PM 12/16/2021    2:02 PM 12/16/2021    1:53 PM 10/21/2021    1:31 PM 07/22/2021    2:03 PM 06/17/2021    2:22 PM 05/01/2021    2:21 PM  PHQ 2/9 Scores  PHQ - 2 Score 4 4 0   2 2  PHQ- 9 Score 11 10    7 7     $ Information is confidential and restricted. Go to Review Flowsheets to unlock data.    Fall Risk    04/16/2022    8:01 PM 12/16/2021    2:02 PM 12/16/2021    1:53 PM 06/17/2021   11:33 AM 05/01/2021    2:11 PM  Fall Risk   Falls in the past year? 1 1 0 1 1  Number falls in past yr: 1 0  0 0  Injury with Fall? 1 1  1 1  $ Risk for fall due to : History of fall(s);Impaired balance/gait;Impaired mobility;Medication side effect History of fall(s)  History of fall(s);Impaired balance/gait;Orthopedic patient History of fall(s);Impaired balance/gait;Medication side effect;Orthopedic patient  Follow up Education provided;Falls prevention discussed;Falls evaluation completed Falls evaluation completed   Falls evaluation completed    FALL RISK PREVENTION PERTAINING TO THE HOME:  Any stairs in or around the home? No  If so, are there any without handrails? No  Home free of loose throw rugs in walkways, pet beds, electrical cords, etc? Yes  Adequate lighting in your home to reduce risk of falls? Yes   ASSISTIVE DEVICES UTILIZED TO PREVENT FALLS:  Life alert? No  Use of a cane, walker or w/c? Yes  Grab bars in the bathroom? Yes  Shower chair or bench in shower? No  Elevated toilet seat or a handicapped toilet? No   TIMED UP AND GO:  Was the test performed? No . Telephonic visit   Cognitive Function:        04/16/2022    8:09 PM 04/02/2020    1:34 PM  6CIT Screen  What Year? 0 points  0 points  What month? 0 points 0 points  What time? 0 points 0 points  Count back from 20 0 points 0 points  Months in reverse 0 points 0 points  Repeat phrase 0 points 0 points  Total Score 0 points 0 points    Immunizations Immunization History  Administered Date(s) Administered   Fluad Quad(high Dose 65+) 12/10/2018, 01/30/2021, 12/16/2021   Influenza, High Dose Seasonal PF 12/05/2019   Influenza,inj,Quad PF,6+ Mos 02/11/2013, 05/17/2015, 12/03/2015, 12/26/2016, 12/16/2017   Influenza-Unspecified 12/15/2018   Moderna Sars-Covid-2 Vaccination 05/05/2019, 06/17/2019   Pneumococcal Conjugate-13 08/22/2019   Pneumococcal Polysaccharide-23 08/28/2020   Tdap 08/22/2019   Zoster Recombinat (Shingrix) 08/28/2020,  05/01/2021   Zoster, Live 06/02/2014    TDAP status: Up to date  Flu Vaccine status: Up to date  Pneumococcal vaccine status: Up to date  Covid-19 vaccine status: Information provided on how to obtain vaccines.   Qualifies for Shingles Vaccine? Yes   Zostavax completed Yes   Shingrix Completed?: Yes  Screening Tests Health Maintenance  Topic Date Due   COVID-19 Vaccine (3 - Moderna risk series) 07/15/2019   MAMMOGRAM  07/31/2022   COLONOSCOPY (Pts 45-38yr Insurance coverage will need to be confirmed)  01/22/2023   Lung Cancer Screening  03/25/2023   Medicare Annual Wellness (AWV)  04/17/2023   DTaP/Tdap/Td (2 - Td or Tdap) 08/21/2029   Pneumonia Vaccine 69 Years old  Completed   INFLUENZA VACCINE  Completed   DEXA SCAN  Completed   Hepatitis C Screening  Completed   Zoster Vaccines- Shingrix  Completed   HPV VACCINES  Aged Out    Health Maintenance  Health Maintenance Due  Topic Date Due   COVID-19 Vaccine (3 - Moderna risk series) 07/15/2019    Colorectal cancer screening: Type of screening: Colonoscopy. Completed 01/21/18. Repeat every 5 years  Mammogram status: Completed 07/30/21. Repeat every year  Bone Density status: Completed 05/03/21. Results  reflect: Bone density results: OSTEOPOROSIS. Repeat every 2 years.  Lung Cancer Screening: (Low Dose CT Chest recommended if Age 69-80years, 30 pack-year currently smoking OR have quit w/in 15years.) does qualify.   Lung Cancer Screening Referral: completed 03/24/22  Additional Screening:  Hepatitis C Screening: does qualify; Completed 09/01/16  Vision Screening: Recommended annual ophthalmology exams for early detection of glaucoma and other disorders of the eye. Is the patient up to date with their annual eye exam?  Yes  Who is the provider or what is the name of the office in which the patient attends annual eye exams? Dr. DRosana Hoes If pt is not established with a provider, would they like to be referred to a provider to establish care? No .   Dental Screening: Recommended annual dental exams for proper oral hygiene  Community Resource Referral / Chronic Care Management: CRR required this visit?  Yes   CCM required this visit?  No      Plan:     I have personally reviewed and noted the following in the patient's chart:   Medical and social history Use of alcohol, tobacco or illicit drugs  Current medications and supplements including opioid prescriptions. Patient is currently taking opioid prescriptions. Information provided to patient regarding non-opioid alternatives. Patient advised to discuss non-opioid treatment plan with their provider. Functional ability and status Nutritional status Physical activity Advanced directives List of other physicians Hospitalizations, surgeries, and ER visits in previous 12 months Vitals Screenings to include cognitive, depression, and falls Referrals and appointments  In addition, I have reviewed and discussed with patient certain preventive protocols, quality metrics, and best practice recommendations. A written personalized care plan for preventive services as well as general preventive health recommendations were provided to patient.      SVanetta Mulders [place]  [id number]  Due to this being a virtual visit, the after visit summary with patients personalized plan was offered to patient via mail or my-chart.  per request, patient was mailed a copy of AVS  Nurse Notes: Patient is requesting a refill of flexeril and is also asking for assistance in completing CAP forms for an in home aide.

## 2022-04-16 NOTE — Patient Instructions (Addendum)
[name] , Thank you for taking time to come for your Medicare Wellness Visit. I appreciate your ongoing commitment to your health goals. Please review the following plan we discussed and let me know if I can assist you in the future.   These are the goals we discussed:  Goals      Prevent falls     Quit Smoking        This is a list of the screening recommended for you and due dates:  Health Maintenance  Topic Date Due   COVID-19 Vaccine (3 - Moderna risk series) 07/15/2019   Mammogram  07/31/2022   Colon Cancer Screening  01/22/2023   Screening for Lung Cancer  03/25/2023   Medicare Annual Wellness Visit  04/17/2023   DTaP/Tdap/Td vaccine (2 - Td or Tdap) 08/21/2029   Pneumonia Vaccine  Completed   Flu Shot  Completed   DEXA scan (bone density measurement)  Completed   Hepatitis C Screening: USPSTF Recommendation to screen - Ages 79-79 yo.  Completed   Zoster (Shingles) Vaccine  Completed   HPV Vaccine  Aged Out    Advanced directives: Advance directive discussed with you today. I have provided a copy for you to complete at home and have notarized. Once this is complete please bring a copy in to our office so we can scan it into your chart.  Conditions/risks identified: Aim for 30 minutes of exercise or brisk walking, 6-8 glasses of water, and 5 servings of fruits and vegetables each day.   Next appointment: Follow up in one year for your annual wellness visit    Preventive Care 65 Years and Older, Female Preventive care refers to lifestyle choices and visits with your health care provider that can promote health and wellness. What does preventive care include? A yearly physical exam. This is also called an annual well check. Dental exams once or twice a year. Routine eye exams. Ask your health care provider how often you should have your eyes checked. Personal lifestyle choices, including: Daily care of your teeth and gums. Regular physical activity. Eating a healthy  diet. Avoiding tobacco and drug use. Limiting alcohol use. Practicing safe sex. Taking low-dose aspirin every day. Taking vitamin and mineral supplements as recommended by your health care provider. What happens during an annual well check? The services and screenings done by your health care provider during your annual well check will depend on your age, overall health, lifestyle risk factors, and family history of disease. Counseling  Your health care provider may ask you questions about your: Alcohol use. Tobacco use. Drug use. Emotional well-being. Home and relationship well-being. Sexual activity. Eating habits. History of falls. Memory and ability to understand (cognition). Work and work Statistician. Reproductive health. Screening  You may have the following tests or measurements: Height, weight, and BMI. Blood pressure. Lipid and cholesterol levels. These may be checked every 5 years, or more frequently if you are over 35 years old. Skin check. Lung cancer screening. You may have this screening every year starting at age 15 if you have a 30-pack-year history of smoking and currently smoke or have quit within the past 15 years. Fecal occult blood test (FOBT) of the stool. You may have this test every year starting at age 64. Flexible sigmoidoscopy or colonoscopy. You may have a sigmoidoscopy every 5 years or a colonoscopy every 10 years starting at age 32. Hepatitis C blood test. Hepatitis B blood test. Sexually transmitted disease (STD) testing. Diabetes screening. This  is done by checking your blood sugar (glucose) after you have not eaten for a while (fasting). You may have this done every 1-3 years. Bone density scan. This is done to screen for osteoporosis. You may have this done starting at age 57. Mammogram. This may be done every 1-2 years. Talk to your health care provider about how often you should have regular mammograms. Talk with your health care provider about  your test results, treatment options, and if necessary, the need for more tests. Vaccines  Your health care provider may recommend certain vaccines, such as: Influenza vaccine. This is recommended every year. Tetanus, diphtheria, and acellular pertussis (Tdap, Td) vaccine. You may need a Td booster every 10 years. Zoster vaccine. You may need this after age 54. Pneumococcal 13-valent conjugate (PCV13) vaccine. One dose is recommended after age 55. Pneumococcal polysaccharide (PPSV23) vaccine. One dose is recommended after age 4. Talk to your health care provider about which screenings and vaccines you need and how often you need them. This information is not intended to replace advice given to you by your health care provider. Make sure you discuss any questions you have with your health care provider. Document Released: 03/16/2015 Document Revised: 11/07/2015 Document Reviewed: 12/19/2014 Elsevier Interactive Patient Education  2017 [place] Prevention in the Home Falls can cause injuries. They can happen to people of all ages. There are many things you can do to make your home safe and to help prevent falls. What can I do on the outside of my home? Regularly fix the edges of walkways and driveways and fix any cracks. Remove anything that might make you trip as you walk through a door, such as a raised step or threshold. Trim any bushes or trees on the path to your home. Use bright outdoor lighting. Clear any walking paths of anything that might make someone trip, such as rocks or tools. Regularly check to see if handrails are loose or broken. Make sure that both sides of any steps have handrails. Any raised decks and porches should have guardrails on the edges. Have any leaves, snow, or ice cleared regularly. Use sand or salt on walking paths during winter. Clean up any spills in your garage right away. This includes oil or grease spills. What can I do in the bathroom? Use  night lights. Install grab bars by the toilet and in the tub and shower. Do not use towel bars as grab bars. Use non-skid mats or decals in the tub or shower. If you need to sit down in the shower, use a plastic, non-slip stool. Keep the floor dry. Clean up any water that spills on the floor as soon as it happens. Remove soap buildup in the tub or shower regularly. Attach bath mats securely with double-sided non-slip rug tape. Do not have throw rugs and other things on the floor that can make you trip. What can I do in the bedroom? Use night lights. Make sure that you have a light by your bed that is easy to reach. Do not use any sheets or blankets that are too big for your bed. They should not hang down onto the floor. Have a firm chair that has side arms. You can use this for support while you get dressed. Do not have throw rugs and other things on the floor that can make you trip. What can I do in the kitchen? Clean up any spills right away. Avoid walking on wet floors. Keep items that  you use a lot in easy-to-reach places. If you need to reach something above you, use a strong step stool that has a grab bar. Keep electrical cords out of the way. Do not use floor polish or wax that makes floors slippery. If you must use wax, use non-skid floor wax. Do not have throw rugs and other things on the floor that can make you trip. What can I do with my stairs? Do not leave any items on the stairs. Make sure that there are handrails on both sides of the stairs and use them. Fix handrails that are broken or loose. Make sure that handrails are as long as the stairways. Check any carpeting to make sure that it is firmly attached to the stairs. Fix any carpet that is loose or worn. Avoid having throw rugs at the top or bottom of the stairs. If you do have throw rugs, attach them to the floor with carpet tape. Make sure that you have a light switch at the top of the stairs and the bottom of the  stairs. If you do not have them, ask someone to add them for you. What else can I do to help prevent falls? Wear shoes that: Do not have high heels. Have rubber bottoms. Are comfortable and fit you well. Are closed at the toe. Do not wear sandals. If you use a stepladder: Make sure that it is fully opened. Do not climb a closed stepladder. Make sure that both sides of the stepladder are locked into place. Ask someone to hold it for you, if possible. Clearly mark and make sure that you can see: Any grab bars or handrails. First and last steps. Where the edge of each step is. Use tools that help you move around (mobility aids) if they are needed. These include: Canes. Walkers. Scooters. Crutches. Turn on the lights when you go into a dark area. Replace any light bulbs as soon as they burn out. Set up your furniture so you have a clear path. Avoid moving your furniture around. If any of your floors are uneven, fix them. If there are any pets around you, be aware of where they are. Review your medicines with your doctor. Some medicines can make you feel dizzy. This can increase your chance of falling. Ask your doctor what other things that you can do to help prevent falls. This information is not intended to replace advice given to you by your health care provider. Make sure you discuss any questions you have with your health care provider. Document Released: 12/14/2008 Document Revised: 07/26/2015 Document Reviewed: 03/24/2014 Elsevier Interactive Patient Education  2017 [employer].

## 2022-04-17 NOTE — Telephone Encounter (Signed)
Completed in different encounter.

## 2022-04-20 DIAGNOSIS — M6281 Muscle weakness (generalized): Secondary | ICD-10-CM | POA: Diagnosis not present

## 2022-04-21 ENCOUNTER — Ambulatory Visit: Payer: Medicare Other | Admitting: Cardiology

## 2022-04-21 NOTE — Progress Notes (Deleted)
Clinical Summary [name] is a 67 y.o.female  seen today for follow up of the following medical problems.    1. Atypical chest pain/CAD - long history of atypical chest pain - cath 2008 nonobstrucive CAD - 07/2013 nuclear stress: no ischemia 01/2018 CT for lung cancer screening showed aortic atherosclerosis, with LM disease, LAD, and LCX      CAD risk factors: hyperlipidemia, HTN, +tobacco, both parents MIs in 78 to 40s, brother MI in his 59s   Jan 2020 nuclear stress no ischemia     - chronic nonspecific chest pains, SOB/DOE with walking. +wheezing - compliant with meds   2. COPD - followed by pulmonary       3. HTN - compliant with meds   4. Hyperlipidemia - compliant with atorvastatin   08/2020 TC 172 TG 152 HDL 50 LDL 96   5. Aortic stenosis - 01/2020 echo mild AS mean grad 12 AVA VTI 1.69  - needs repeats in 2024   Past Medical History:  Diagnosis Date   Allergy    Arthritis    Atypical mole 06/05/2003   slight-mod-Left scapula (WS)   Atypical nevus 07/26/2003   persist. dysp- Left scapula (WS)   Basal cell carcinoma 06/05/2003   RIght chest (CX35FU)   Basal cell carcinoma 05/22/2004   superificial- RIght upper back- (CX35FU)   Basal cell carcinoma 11/27/2004   beside R nostril-(MOHS), below right outer nose (MOHS)   Basal cell carcinoma 11/08/2009   left post shoulder -(txpbx)   Basal cell carcinoma 05/24/2012   ulcerated- Left post shoulder- (CX35FU), ulcerated-mid forehead (EXC )   Basal cell carcinoma 06/24/2012   superificial- Right shoulder - (txpbx)    Basal cell carcinoma 07/29/2012    sclerosis- mid forehead (MOHS)   Basal cell carcinoma 06/06/2019   nod-right anterior neck (CX35FU)   Cataract    Closed compression fracture of L5 vertebra (HCC)    Constipation    uses OTC meds with help   COPD (chronic obstructive pulmonary disease) (HCC)    Coronary artery disease    minimal nonobstructive    COVID    Depression    GERD  (gastroesophageal reflux disease)    Heart murmur    Hx of adenomatous polyp of colon 10/03/2014   Hypercholesteremia    Hypertension    Panic attacks    Perimenopausal    Skin cancer    Squamous cell carcinoma of skin 06/05/2003   Right Temple(CX35FU)   Squamous cell carcinoma of skin 05/22/2004   in situ- Left upperarm (CX35FU)   Squamous cell carcinoma of skin 11/27/2004   in situ- above Left elbow (CX35FU)   Squamous cell carcinoma of skin 03/04/2006   in situ- Left sideburn (CX35FU)   Squamous cell carcinoma of skin 11/26/2006   in situ- mid brow (Cx35FU)   Squamous cell carcinoma of skin 05/24/2012   in situ- Left chest- (CX35FU)   Squamous cell carcinoma of skin 04/05/2013   in situ- Left nose (Cx35FU)   Squamous cell carcinoma of skin 08/17/2013   well diff-above Left eye (txpbx)   Squamous cell carcinoma of skin 07/30/2016   in situ- right chest sup (Cx35FU)   Squamous cell carcinoma of skin 05/12/2017   in situ- RIght shoulder (CX35FU), in situ- Left forehead (CX35FU)   Squamous cell carcinoma of skin 11/23/2018   in situ- Right mid shin, inner- (MOHS)   Tobacco user      Allergies  Allergen Reactions  Lorcet 10-650 [Hydrocodone-Acetaminophen] Itching   Sulfa Antibiotics Other (See Comments)    aching   Tape Other (See Comments)    Tear skin. Please use paper tape   Wellbutrin Xl [Bupropion Hcl Er (Xl)]     Upset stomach /bad taste in mouth     Current Outpatient Medications  Medication Sig Dispense Refill   acetaminophen (TYLENOL) 500 MG tablet Take 1,000 mg by mouth every 6 (six) hours as needed for moderate pain.     albuterol (VENTOLIN HFA) 108 (90 Base) MCG/ACT inhaler Inhale 2 puffs into the lungs every 6 (six) hours as needed for wheezing or shortness of breath. 8 g 5   amLODipine (NORVASC) 10 MG tablet TAKE ONE TABLET BY MOUTH DAILY 90 tablet 3   Ascorbic Acid (VITAMIN C) 1000 MG tablet Take 1,000 mg by mouth daily.     atorvastatin (LIPITOR) 40  MG tablet TAKE ONE TABLET BY MOUTH AT BEDTIME 90 tablet 0   budesonide-formoterol (SYMBICORT) 160-4.5 MCG/ACT inhaler INHALE TWO PUFFS INTO THE LUNGS TWICE DAILY 10.2 g 2   clonazePAM (KLONOPIN) 1 MG tablet Take 1 tablet (1 mg total) by mouth 3 (three) times daily as needed. for anxiety 90 tablet 2   cyclobenzaprine (FLEXERIL) 5 MG tablet Take 1 tablet (5 mg total) by mouth 3 (three) times daily as needed. 90 tablet 0   dexlansoprazole (DEXILANT) 60 MG capsule TAKE ONE CAPSULE BY MOUTH DAILY ON EMPTY STOMACH, THEN DO NOT EAT FOR ONE HOUR. (REPLACES PANTROPRAZOLE FOR REFLUX) 90 capsule 3   DULoxetine (CYMBALTA) 60 MG capsule TAKE ONE CAPSULE BY MOUTH TWICE DAILY 60 capsule 2   fexofenadine (ALLEGRA) 180 MG tablet TAKE ONE TABLET BY MOUTH DAILY FOR ALLERGY SYMPTOMS (Patient taking differently: Take 180 mg by mouth daily.) 30 tablet 11   fluorometholone (FML) 0.1 % ophthalmic suspension SMARTSIG:In Eye(s)     gabapentin (NEURONTIN) 600 MG tablet Take 600 mg by mouth 3 (three) times daily.     ipratropium-albuterol (DUONEB) 0.5-2.5 (3) MG/3ML SOLN Take 3 mLs by nebulization every 6 (six) hours as needed. 360 mL 5   lidocaine (LIDODERM) 5 % Place 1 patch onto the skin daily. Remove & Discard patch within 12 hours or as directed by MD 30 patch 0   lisinopril (ZESTRIL) 20 MG tablet TAKE ONE TABLET BY MOUTH ONCE DAILY 90 tablet 0   mupirocin ointment (BACTROBAN) 2 % Apply 1 application. topically 2 (two) times daily. 22 g 0   naproxen sodium (ALEVE) 220 MG tablet Take 220 mg by mouth 2 (two) times daily as needed (pain).     oxyCODONE (ROXICODONE) 15 MG immediate release tablet Take 15 mg by mouth 4 (four) times daily as needed.     rOPINIRole (REQUIP) 2 MG tablet Take 1 tablet (2 mg total) by mouth at bedtime. For leg cramps 90 tablet 1   Tiotropium Bromide Monohydrate (SPIRIVA RESPIMAT) 2.5 MCG/ACT AERS INHALE TWO PUFFS INTO THE LUNGS DAILY (NEEDS TO BE SEEN BEFORE NEXT REFILL) 4 g 5   Vitamin D,  Ergocalciferol, (DRISDOL) 1.25 MG (50000 UNIT) CAPS capsule TAKE ONE CAPSULE BY MOUTH ONCE WEEKLY 13 capsule 3   No current facility-administered medications for this visit.     Past Surgical History:  Procedure Laterality Date   ABDOMINAL HYSTERECTOMY     CATARACT EXTRACTION W/PHACO Right 03/01/2021   Procedure: CATARACT EXTRACTION PHACO AND INTRAOCULAR LENS PLACEMENT (IOC);  Surgeon: Baruch Goldmann, MD;  Location: [facility];  Service: Ophthalmology;  Laterality: Right;  CDE: 7.31   CATARACT EXTRACTION W/PHACO Left 05/16/2021   Procedure: CATARACT EXTRACTION PHACO AND INTRAOCULAR LENS PLACEMENT (IOC);  Surgeon: Baruch Goldmann, MD;  Location: [facility];  Service: Ophthalmology;  Laterality: Left;  CDE 9.41    COLONOSCOPY     IR VERTEBROPLASTY CERV/THOR BX INC UNI/BIL INC/INJECT/IMAGING  04/16/2021   Right foot toe surgery     UPPER GASTROINTESTINAL ENDOSCOPY     vocal cord     polyp removal     Allergies  Allergen Reactions   Lorcet 10-650 [Hydrocodone-Acetaminophen] Itching   Sulfa Antibiotics Other (See Comments)    aching   Tape Other (See Comments)    Tear skin. Please use paper tape   Wellbutrin Xl [Bupropion Hcl Er (Xl)]     Upset stomach /bad taste in mouth      Family History  Problem Relation Age of Onset   Heart disease Mother    Heart attack Mother    Heart attack Father    Colon polyps Sister    Esophageal cancer Sister    Anxiety disorder Sister    Depression Sister    Colon polyps Sister    Heart attack Brother    Colon cancer Brother 40       died at age 27   Anxiety disorder Brother    Depression Brother    Alcohol abuse Brother    Colon polyps Brother    Colon polyps Brother    Drug abuse Son    Breast cancer Niece    Rectal cancer Neg Hx    Stomach cancer Neg Hx      Social History [name] reports that she has been smoking cigarettes. She started smoking about 53 years ago. She has a 40.00 pack-year smoking history. She has never used  smokeless tobacco. [name] reports no history of alcohol use.   Review of Systems CONSTITUTIONAL: No weight loss, fever, chills, weakness or fatigue.  HEENT: Eyes: No visual loss, blurred vision, double vision or yellow sclerae.No hearing loss, sneezing, congestion, runny nose or sore throat.  SKIN: No rash or itching.  CARDIOVASCULAR:  RESPIRATORY: No shortness of breath, cough or sputum.  GASTROINTESTINAL: No anorexia, nausea, vomiting or diarrhea. No abdominal pain or blood.  GENITOURINARY: No burning on urination, no polyuria NEUROLOGICAL: No headache, dizziness, syncope, paralysis, ataxia, numbness or tingling in the extremities. No change in bowel or bladder control.  MUSCULOSKELETAL: No muscle, back pain, joint pain or stiffness.  LYMPHATICS: No enlarged nodes. No history of splenectomy.  PSYCHIATRIC: No history of depression or anxiety.  ENDOCRINOLOGIC: No reports of sweating, cold or heat intolerance. No polyuria or polydipsia.  Marland Kitchen   Physical Examination There were no vitals filed for this visit. There were no vitals filed for this visit.  Gen: resting comfortably, no acute distress HEENT: no scleral icterus, pupils equal round and reactive, no palptable cervical adenopathy,  CV Resp: Clear to auscultation bilaterally GI: abdomen is soft, non-tender, non-distended, normal bowel sounds, no hepatosplenomegaly MSK: extremities are warm, no edema.  Skin: warm, no rash Neuro:  no focal deficits Psych: appropriate affect   Diagnostic Studies  06/2006 cath FINDINGS:  Aortic pressure 125/78 with a mean of 99. Left ventricular  pressure 125/5 with an end-diastolic pressure of 10.    The left mainstem is angiographically normal.  It bifurcates into the  LAD and left circumflex.    The LAD is a large-caliber vessel that courses down to the LV apex.  It  gives  off a first diagonal Venessa Wickham and is a large vessel.  There is no  significant disease in the LAD or diagonal  system.    Left circumflex is a large-caliber vessel.  There is a small  intermediate Ramsie Ostrander present.  There are really no substantial obtuse  marginal branches from the left circumflex present.  The left circumflex  courses down the AV groove and gives off a large posterolateral Lorrinda Ramstad.  The mid portion of the left circumflex has a long segment of 30-40%  stenosis.    The right coronary artery is a large-caliber vessel.  It terminates  distally in a PDA and posterior AV segment which gives off two  posterolateral branches.  The mid portion of the right coronary artery  provides a small RV marginal Neima Lacross, and the proximal portion has a  small conus Rayne Loiseau present.  There are minor luminal irregularities in  the proximal portion of the right coronary artery, but there is no  significant angiographic disease.    Left ventriculography performed in the 30-degree RAO projection  demonstrates normal left ventricular systolic function with an LVEF of  55%.  There is no mitral regurgitation.    ASSESSMENT:  1. Nonobstructive coronary artery disease, predominantly involving the      left circumflex system.  2. Normal left ventricular function.  3. Intracoronary vascular ultrasound demonstration of minimal plaque      in the right coronary artery.    PLAN:  [name] will require aggressive medical therapy.  She needs  tobacco cessation and progressive statin therapy.  She will receive  her statin therapy through the [facility] protocol.  She should be on a  daily aspirin as well.  She will be a candidate for early discharge as  her chest pain appears to have been noncardiac, and she has had no  further pain.     Jan 2020 nuclear stress There was no ST segment deviation noted during stress. Defect 1: There is a medium defect of mild severity present in the basal inferior, mid inferior and apical inferior location. This is likely due to soft tissue attenuation. No evidence of ischemia. This  is a low risk study. Nuclear stress EF: 58%.   01/2020 echo IMPRESSIONS     1. Left ventricular ejection fraction, by estimation, is 60 to 65%. The  left ventricle has normal function. The left ventricle has no regional  wall motion abnormalities. Left ventricular diastolic parameters are  consistent with Grade I diastolic  dysfunction (impaired relaxation).   2. Right ventricular systolic function is normal. The right ventricular  size is normal.   3. The mitral valve is normal in structure. No evidence of mitral valve  regurgitation. No evidence of mitral stenosis.   4. The aortic valve has an indeterminant number of cusps. There is mild  calcification of the aortic valve. There is mild thickening of the aortic  valve. Aortic valve regurgitation is mild. Mild aortic valve stenosis.  Mild aortic stenosis is present.  Aortic valve mean gradient measures 12.0 mmHg. Aortic valve peak gradient  measures 23.1 mmHg. Aortic valve area, by VTI measures 1.69 cm.   5. The inferior vena cava is normal in size with greater than 50%  respiratory variability, suggesting right atrial pressure of 3 mmHg.      Assessment and Plan   1. CAD/Atypical chest pain - long history of atypical chest pain - nonobstructive CAD by cath 2008, negative nuclear stress 2015 and more recently Jan 2020 -  chronic symptoms unchanged, continue to monitor   2.Aortic stenosis - mild by recent echo - will repeat study in 2024     3. HTN - manual recheck 134/75. At other provider visits has essentially been right at goal - continue current meds   4. Hyperlipidemia - at goal, continue statin     Arnoldo Lenis, M.D

## 2022-04-22 ENCOUNTER — Telehealth (INDEPENDENT_AMBULATORY_CARE_PROVIDER_SITE_OTHER): Payer: 59 | Admitting: Psychiatry

## 2022-04-22 ENCOUNTER — Encounter (HOSPITAL_COMMUNITY): Payer: Self-pay | Admitting: Psychiatry

## 2022-04-22 DIAGNOSIS — F332 Major depressive disorder, recurrent severe without psychotic features: Secondary | ICD-10-CM

## 2022-04-22 DIAGNOSIS — F411 Generalized anxiety disorder: Secondary | ICD-10-CM | POA: Diagnosis not present

## 2022-04-22 MED ORDER — DULOXETINE HCL 60 MG PO CPEP: 60.0000 mg | ORAL_CAPSULE | Freq: Two times a day (BID) | ORAL | 2 refills | Status: DC

## 2022-04-22 MED ORDER — CLONAZEPAM 1 MG PO TABS: 1.0000 mg | ORAL_TABLET | Freq: Three times a day (TID) | ORAL | 2 refills | Status: DC | PRN

## 2022-04-22 NOTE — Progress Notes (Signed)
Virtual Visit via Telephone Note  I connected with [name] on 04/22/22 at  1:20 PM EST by telephone and verified that I am speaking with the correct person using two identifiers.  Location: Patient: home Provider: office   I discussed the limitations, risks, security and privacy concerns of performing an evaluation and management service by telephone and the availability of in person appointments. I also discussed with the patient that there may be a patient responsible charge related to this service. The patient expressed understanding and agreed to proceed.     I discussed the assessment and treatment plan with the patient. The patient was provided an opportunity to ask questions and all were answered. The patient agreed with the plan and demonstrated an understanding of the instructions.   The patient was advised to call back or seek an in-person evaluation if the symptoms worsen or if the condition fails to improve as anticipated.  I provided 15 minutes of non-face-to-face time during this encounter.   Kerry Spiller, MD  [facility] MD/PA/NP OP Progress Note  04/22/2022 1:43 PM [name]  MRN:  [id number]  Chief Complaint:  Chief Complaint  Patient presents with   Depression   Anxiety   Follow-up   HPI: This patient is a 67 year old female who is widowed and lives alone in [place].  She is on disability.  The patient returns for follow-up after 3 months regarding her anxiety and depression.  She states she is doing about the same.  She recently had a PET scan on her lung nodule but it did not appear to be cancerous.  She is still very worried about all this as she does not understand the results in [place].  I urged her to talk to her pulmonologist but for the best that I can tell it did not turn out to be cancer this time.  However she is still smoking and I urged her to try to quit.  She does think the medications for depression and anxiety are still helping quite a bit.  She  is sleeping well. Visit Diagnosis:    ICD-10-CM   1. Severe episode of recurrent major depressive disorder, without psychotic features (HCC)  F33.2 DULoxetine (CYMBALTA) 60 MG capsule    2. GAD (generalized anxiety disorder)  F41.1 clonazePAM (KLONOPIN) 1 MG tablet      Past Psychiatric History: none  Past Medical History:  Past Medical History:  Diagnosis Date   Allergy    Arthritis    Atypical mole 06/05/2003   slight-mod-Left scapula (WS)   Atypical nevus 07/26/2003   persist. dysp- Left scapula (WS)   Basal cell carcinoma 06/05/2003   RIght chest (CX35FU)   Basal cell carcinoma 05/22/2004   superificial- RIght upper back- (CX35FU)   Basal cell carcinoma 11/27/2004   beside R nostril-(MOHS), below right outer nose (MOHS)   Basal cell carcinoma 11/08/2009   left post shoulder -(txpbx)   Basal cell carcinoma 05/24/2012   ulcerated- Left post shoulder- (CX35FU), ulcerated-mid forehead (EXC )   Basal cell carcinoma 06/24/2012   superificial- Right shoulder - (txpbx)    Basal cell carcinoma 07/29/2012    sclerosis- mid forehead (MOHS)   Basal cell carcinoma 06/06/2019   nod-right anterior neck (CX35FU)   Cataract    Closed compression fracture of L5 vertebra (HCC)    Constipation    uses OTC meds with help   COPD (chronic obstructive pulmonary disease) (HCC)    Coronary artery disease    minimal  nonobstructive    COVID    Depression    GERD (gastroesophageal reflux disease)    Heart murmur    Hx of adenomatous polyp of colon 10/03/2014   Hypercholesteremia    Hypertension    Panic attacks    Perimenopausal    Skin cancer    Squamous cell carcinoma of skin 06/05/2003   Right [place](CX35FU)   Squamous cell carcinoma of skin 05/22/2004   in situ- Left upperarm (CX35FU)   Squamous cell carcinoma of skin 11/27/2004   in situ- above Left elbow (CX35FU)   Squamous cell carcinoma of skin 03/04/2006   in situ- Left sideburn (CX35FU)   Squamous cell carcinoma of skin  11/26/2006   in situ- mid brow (Cx35FU)   Squamous cell carcinoma of skin 05/24/2012   in situ- Left chest- (CX35FU)   Squamous cell carcinoma of skin 04/05/2013   in situ- Left nose (Cx35FU)   Squamous cell carcinoma of skin 08/17/2013   well diff-above Left eye (txpbx)   Squamous cell carcinoma of skin 07/30/2016   in situ- right chest sup (Cx35FU)   Squamous cell carcinoma of skin 05/12/2017   in situ- RIght shoulder (CX35FU), in situ- Left forehead (CX35FU)   Squamous cell carcinoma of skin 11/23/2018   in situ- Right mid shin, inner- (MOHS)   Tobacco user     Past Surgical History:  Procedure Laterality Date   ABDOMINAL HYSTERECTOMY     CATARACT EXTRACTION W/PHACO Right 03/01/2021   Procedure: CATARACT EXTRACTION PHACO AND INTRAOCULAR LENS PLACEMENT (IOC);  Surgeon: Baruch Goldmann, MD;  Location: [facility];  Service: Ophthalmology;  Laterality: Right;  CDE: 7.31   CATARACT EXTRACTION W/PHACO Left 05/16/2021   Procedure: CATARACT EXTRACTION PHACO AND INTRAOCULAR LENS PLACEMENT (IOC);  Surgeon: Baruch Goldmann, MD;  Location: [facility];  Service: Ophthalmology;  Laterality: Left;  CDE 9.41    COLONOSCOPY     IR VERTEBROPLASTY CERV/THOR BX INC UNI/BIL INC/INJECT/IMAGING  04/16/2021   Right foot toe surgery     UPPER GASTROINTESTINAL ENDOSCOPY     vocal cord     polyp removal    Family Psychiatric History: See below  Family History:  Family History  Problem Relation Age of Onset   Heart disease Mother    Heart attack Mother    Heart attack Father    Colon polyps Sister    Esophageal cancer Sister    Anxiety disorder Sister    Depression Sister    Colon polyps Sister    Heart attack Brother    Colon cancer Brother 7       died at age 27   Anxiety disorder Brother    Depression Brother    Alcohol abuse Brother    Colon polyps Brother    Colon polyps Brother    Drug abuse Son    Breast cancer Niece    Rectal cancer Neg Hx    Stomach cancer Neg Hx     Social History:   Social History   Socioeconomic History   Marital status: Single    Spouse name: Not on file   Number of children: 2   Years of education: Not on file   Highest education level: Not on file  Occupational History   Occupation: Scientist, 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   Sexual activity: Not on file  Other Topics Concern   Not on file  Social History Narrative   Retired/ lives alone in one level home   Son and daughter in law that live next door and help her out a lot         Social Determinants of Health   Financial Resource Strain: Medium Risk (04/16/2022)   Overall Financial Resource Strain (CARDIA)    Difficulty of Paying Living Expenses: Somewhat hard  Food Insecurity: No Food Insecurity (04/16/2022)   Hunger Vital Sign    Worried About Running Out of Food in the Last Year: Never true    Ran Out of Food in the Last Year: Never true  Transportation Needs: No Transportation Needs (04/16/2022)   PRAPARE - Hydrologist (Medical): No    Lack of Transportation (Non-Medical): No  Physical Activity: Inactive (04/16/2022)   Exercise Vital Sign    Days of Exercise per Week: 0 days    Minutes of Exercise per Session: 0 min  Stress: Stress Concern Present (04/16/2022)   [place]    Feeling of Stress : To some extent  Social Connections: Socially Isolated (04/16/2022)   Social Connection and Isolation Panel [NHANES]    Frequency of  Communication with Friends and Family: More than three times a week    Frequency of Social Gatherings with Friends and Family: More than three times a week    Attends Religious Services: Never    Marine scientist or Organizations: No    Attends Archivist Meetings: Never    Marital Status: Divorced    Allergies:  Allergies  Allergen Reactions   Lorcet 10-650 [Hydrocodone-Acetaminophen] Itching   Sulfa Antibiotics Other (See Comments)    aching   Tape Other (See Comments)    Tear skin. Please use paper tape   Wellbutrin Xl [Bupropion Hcl Er (Xl)]     Upset stomach /bad taste in mouth    Metabolic Disorder Labs: Lab Results  Component Value Date   HGBA1C 5.9 (H) 07/12/2013   MPG 123 (H) 07/12/2013   No results found for: "PROLACTIN" Lab Results  Component Value Date   CHOL 157 12/16/2021   TRIG 101 12/16/2021   HDL 53 12/16/2021   CHOLHDL 3.0 12/16/2021   VLDL 36 07/13/2013   LDLCALC 86 12/16/2021   LDLCALC 98 05/01/2021   Lab Results  Component Value Date   TSH 1.820 05/25/2017   TSH 1.080 05/17/2015    Therapeutic Level Labs: No results found for: "LITHIUM" No results found for: "VALPROATE" No results found for: "CBMZ"  Current Medications: Current Outpatient Medications  Medication Sig Dispense Refill   acetaminophen (TYLENOL) 500 MG tablet Take 1,000 mg by mouth every 6 (six) hours as needed for moderate pain.     albuterol (VENTOLIN HFA) 108 (90 Base) MCG/ACT inhaler Inhale 2 puffs into the lungs every 6 (six) hours as needed for wheezing or shortness of breath. 8 g 5   amLODipine (NORVASC) 10 MG tablet TAKE ONE TABLET BY MOUTH DAILY  90 tablet 3   Ascorbic Acid (VITAMIN C) 1000 MG tablet Take 1,000 mg by mouth daily.     atorvastatin (LIPITOR) 40 MG tablet TAKE ONE TABLET BY MOUTH AT BEDTIME 90 tablet 0   budesonide-formoterol (SYMBICORT) 160-4.5 MCG/ACT inhaler INHALE TWO PUFFS INTO THE LUNGS TWICE DAILY 10.2 g 2   clonazePAM (KLONOPIN) 1  MG tablet Take 1 tablet (1 mg total) by mouth 3 (three) times daily as needed. for anxiety 90 tablet 2   cyclobenzaprine (FLEXERIL) 5 MG tablet Take 1 tablet (5 mg total) by mouth 3 (three) times daily as needed. 90 tablet 0   dexlansoprazole (DEXILANT) 60 MG capsule TAKE ONE CAPSULE BY MOUTH DAILY ON EMPTY STOMACH, THEN DO NOT EAT FOR ONE HOUR. (REPLACES PANTROPRAZOLE FOR REFLUX) 90 capsule 3   DULoxetine (CYMBALTA) 60 MG capsule Take 1 capsule (60 mg total) by mouth 2 (two) times daily. 60 capsule 2   fexofenadine (ALLEGRA) 180 MG tablet TAKE ONE TABLET BY MOUTH DAILY FOR ALLERGY SYMPTOMS (Patient taking differently: Take 180 mg by mouth daily.) 30 tablet 11   fluorometholone (FML) 0.1 % ophthalmic suspension SMARTSIG:In Eye(s)     gabapentin (NEURONTIN) 600 MG tablet Take 600 mg by mouth 3 (three) times daily.     ipratropium-albuterol (DUONEB) 0.5-2.5 (3) MG/3ML SOLN Take 3 mLs by nebulization every 6 (six) hours as needed. 360 mL 5   lidocaine (LIDODERM) 5 % Place 1 patch onto the skin daily. Remove & Discard patch within 12 hours or as directed by MD 30 patch 0   lisinopril (ZESTRIL) 20 MG tablet TAKE ONE TABLET BY MOUTH ONCE DAILY 90 tablet 0   mupirocin ointment (BACTROBAN) 2 % Apply 1 application. topically 2 (two) times daily. 22 g 0   naproxen sodium (ALEVE) 220 MG tablet Take 220 mg by mouth 2 (two) times daily as needed (pain).     oxyCODONE (ROXICODONE) 15 MG immediate release tablet Take 15 mg by mouth 4 (four) times daily as needed.     rOPINIRole (REQUIP) 2 MG tablet Take 1 tablet (2 mg total) by mouth at bedtime. For leg cramps 90 tablet 1   Tiotropium Bromide Monohydrate (SPIRIVA RESPIMAT) 2.5 MCG/ACT AERS INHALE TWO PUFFS INTO THE LUNGS DAILY (NEEDS TO BE SEEN BEFORE NEXT REFILL) 4 g 5   Vitamin D, Ergocalciferol, (DRISDOL) 1.25 MG (50000 UNIT) CAPS capsule TAKE ONE CAPSULE BY MOUTH ONCE WEEKLY 13 capsule 3   No current facility-administered medications for this visit.      Musculoskeletal: Strength & Muscle Tone: na Gait & Station: na Patient leans: N/A  Psychiatric Specialty Exam: Review of Systems  Musculoskeletal:  Positive for arthralgias and back pain.  Psychiatric/Behavioral:  The patient is nervous/anxious.   All other systems reviewed and are negative.   There were no vitals taken for this visit.There is no height or weight on file to calculate BMI.  General Appearance: NA  Eye Contact:  NA  Speech:  Clear and Coherent  Volume:  Normal  Mood:  Anxious  Affect:  NA  Thought Process:  Goal Directed  Orientation:  Full (Time, Place, and Person)  Thought Content: WDL   Suicidal Thoughts:  No  Homicidal Thoughts:  No  Memory:  Immediate;   Good Recent;   Good Remote;   NA  Judgement:  Good  Insight:  Fair  Psychomotor Activity:  Decreased  Concentration:  Concentration: Fair and Attention Span: Fair  Recall:  Good  Fund of Knowledge: Good  Language:  Good  Akathisia:  No  Handed:  Right  AIMS (if indicated): not done  Assets:  Communication Skills Desire for Improvement Resilience Social Support  ADL's:  Intact  Cognition: WNL  Sleep:  Good   Screenings: GAD-7    Flowsheet Row Office Visit from 12/16/2021 in [facility] Office Visit from 06/17/2021 in [facility] Office Visit from 05/01/2021 in [facility] Office Visit from 04/08/2021 in [facility] Office Visit from 08/28/2020 in [facility]  Total GAD-7 Score 16 5 7 5 7      $ PHQ2-9    Flowsheet Row Clinical Support from 04/16/2022 in [facility] Office Visit from 12/16/2021 in [facility] Video Visit from 10/21/2021 in [facility] [place] Video Visit from 07/22/2021 in [facility] [place] Visit from 06/17/2021 in [facility]  PHQ-2 Total Score 4 4 4 $ 0 2  PHQ-9 Total Score 11 10 8 $ -- 7      Flowsheet Row Video Visit from 10/21/2021 in [facility] [place] Video Visit from 07/22/2021 in [facility] [place] Admission (Discharged) from 05/16/2021 in [facility] No Risk No Risk No Risk        Assessment and Plan: This patient is a 67 year old female with a history of depression and anxiety.  For the most part she is stable on her current regimen.  She will continue Cymbalta 60 mg twice daily for depression and clonazepam 1 mg 3 times daily for anxiety.  She will return to see me in 3 months  Collaboration of Care: Collaboration of Care: Primary Care Provider AEB notes are shared with PCP on the epic system  Patient/Guardian was advised Release of Information must be obtained prior to any record release in order to collaborate their care with an outside provider. Patient/Guardian was advised if they have not already done so to contact the registration department to sign all necessary forms in order for [place] to release information regarding their care.   Consent: Patient/Guardian gives verbal consent for treatment and assignment of benefits for services provided during this visit. Patient/Guardian expressed understanding and agreed to proceed.    Kerry Spiller, MD 04/22/2022, 1:43 PM

## 2022-04-25 ENCOUNTER — Encounter: Payer: Self-pay | Admitting: Pulmonary Disease

## 2022-04-25 ENCOUNTER — Ambulatory Visit (INDEPENDENT_AMBULATORY_CARE_PROVIDER_SITE_OTHER): Payer: 59 | Admitting: Pulmonary Disease

## 2022-04-25 VITALS — BP 118/78 | HR 96 | Temp 98.7°F | Ht 65.0 in | Wt 146.2 lb

## 2022-04-25 DIAGNOSIS — J441 Chronic obstructive pulmonary disease with (acute) exacerbation: Secondary | ICD-10-CM

## 2022-04-25 DIAGNOSIS — F17210 Nicotine dependence, cigarettes, uncomplicated: Secondary | ICD-10-CM | POA: Diagnosis not present

## 2022-04-25 DIAGNOSIS — R911 Solitary pulmonary nodule: Secondary | ICD-10-CM

## 2022-04-25 DIAGNOSIS — F17200 Nicotine dependence, unspecified, uncomplicated: Secondary | ICD-10-CM

## 2022-04-25 MED ORDER — AZITHROMYCIN 250 MG PO TABS: ORAL_TABLET | ORAL | 0 refills | Status: DC

## 2022-04-25 MED ORDER — NICOTINE 21 MG/24HR TD PT24: 21.0000 mg | MEDICATED_PATCH | Freq: Every day | TRANSDERMAL | 0 refills | Status: DC

## 2022-04-25 MED ORDER — VARENICLINE TARTRATE (STARTER) 0.5 MG X 11 & 1 MG X 42 PO TBPK: ORAL_TABLET | ORAL | 0 refills | Status: DC

## 2022-04-25 MED ORDER — PREDNISONE 20 MG PO TABS: ORAL_TABLET | ORAL | 0 refills | Status: DC

## 2022-04-25 NOTE — Patient Instructions (Signed)
Will prescribe nicotine patches, Chantix Will give you Z-Pak and prednisone 40 mg a day for 5 days Agree with follow-up CT in 6 months which is already been ordered Follow-up in 6 months after CT scan

## 2022-04-25 NOTE — Addendum Note (Signed)
Addended by: Elton Sin on: 04/25/2022 03:44 PM   Modules accepted: Orders

## 2022-04-25 NOTE — Progress Notes (Signed)
[name]    [id number]    [birth year]  Primary Care Physician:Stacks, Cletus Gash, MD  Referring Physician: Claretta Fraise, MD [place],  [address] [address]  Chief complaint: Follow up for COPD  HPI: Active smoker with history of coronary artery disease, COPD Here for follow-up of COPD.  Maintained on Symbicort and Spiriva dyspnea with wheezing.    Evaluated for hypersensitivity pneumonitis in 2020 due to ongoing mold exposure.  CT is not typical for hypersensitivity ILD and HP panel is negative  Evaluated in the ED in January 2021 for pleuritic right chest pain with negative CTA, negative cardiac work-up  Pets: Has 2 dogs, no birds, farm animals Occupation: Used to work in a Research officer, trade union.  Currently on disability Exposures: Reports living in a home with significant mold exposure for the past 10 months. Smoking history: 50 pack year smoker.  Continues to smoke 1 pack/day Travel history: No significant travel history Relevant family history: Sister has COPD.  Interim history: Complains of increasing cough with chest congestion, green mucus and dyspnea Continues to smoke 1 pack/day  Recently had a screening CT which showed increase in right upper lobe groundglass nodule.  PET scan was negative.  Outpatient Encounter Medications as of 04/25/2022  Medication Sig   acetaminophen (TYLENOL) 500 MG tablet Take 1,000 mg by mouth every 6 (six) hours as needed for moderate pain.   albuterol (VENTOLIN HFA) 108 (90 Base) MCG/ACT inhaler Inhale 2 puffs into the lungs every 6 (six) hours as needed for wheezing or shortness of breath.   amLODipine (NORVASC) 10 MG tablet TAKE ONE TABLET BY MOUTH DAILY   Ascorbic Acid (VITAMIN C) 1000 MG tablet Take 1,000 mg by mouth daily.   atorvastatin (LIPITOR) 40 MG tablet TAKE ONE TABLET BY MOUTH AT BEDTIME   budesonide-formoterol (SYMBICORT) 160-4.5 MCG/ACT inhaler INHALE TWO PUFFS INTO THE LUNGS TWICE DAILY   clonazePAM (KLONOPIN)  1 MG tablet Take 1 tablet (1 mg total) by mouth 3 (three) times daily as needed. for anxiety   dexlansoprazole (DEXILANT) 60 MG capsule TAKE ONE CAPSULE BY MOUTH DAILY ON EMPTY STOMACH, THEN DO NOT EAT FOR ONE HOUR. (REPLACES PANTROPRAZOLE FOR REFLUX)   DULoxetine (CYMBALTA) 60 MG capsule Take 1 capsule (60 mg total) by mouth 2 (two) times daily.   fexofenadine (ALLEGRA) 180 MG tablet TAKE ONE TABLET BY MOUTH DAILY FOR ALLERGY SYMPTOMS (Patient taking differently: Take 180 mg by mouth daily.)   fluorometholone (FML) 0.1 % ophthalmic suspension SMARTSIG:In Eye(s)   gabapentin (NEURONTIN) 600 MG tablet Take 600 mg by mouth 3 (three) times daily.   ipratropium-albuterol (DUONEB) 0.5-2.5 (3) MG/3ML SOLN Take 3 mLs by nebulization every 6 (six) hours as needed.   lidocaine (LIDODERM) 5 % Place 1 patch onto the skin daily. Remove & Discard patch within 12 hours or as directed by MD   lisinopril (ZESTRIL) 20 MG tablet TAKE ONE TABLET BY MOUTH ONCE DAILY   mupirocin ointment (BACTROBAN) 2 % Apply 1 application. topically 2 (two) times daily.   naproxen sodium (ALEVE) 220 MG tablet Take 220 mg by mouth 2 (two) times daily as needed (pain).   oxyCODONE (ROXICODONE) 15 MG immediate release tablet Take 15 mg by mouth 4 (four) times daily as needed.   rOPINIRole (REQUIP) 2 MG tablet Take 1 tablet (2 mg total) by mouth at bedtime. For leg cramps   Tiotropium Bromide Monohydrate (SPIRIVA RESPIMAT) 2.5 MCG/ACT AERS INHALE TWO PUFFS INTO THE  LUNGS DAILY (NEEDS TO BE SEEN BEFORE NEXT REFILL)   Vitamin D, Ergocalciferol, (DRISDOL) 1.25 MG (50000 UNIT) CAPS capsule TAKE ONE CAPSULE BY MOUTH ONCE WEEKLY   [DISCONTINUED] cyclobenzaprine (FLEXERIL) 5 MG tablet Take 1 tablet (5 mg total) by mouth 3 (three) times daily as needed.   No facility-administered encounter medications on file as of 04/25/2022.    Physical Exam: Blood pressure 118/78, pulse 96, temperature 98.7 F (37.1 C), temperature source Oral, height '5\' 5"'$   (1.651 m), weight 146 lb 3.2 oz (66.3 kg), SpO2 95 %. Gen:      No acute distress HEENT:  EOMI, sclera anicteric Neck:     No masses; no thyromegaly Lungs:    Clear to auscultation bilaterally; normal respiratory effort CV:         Regular rate and rhythm; no murmurs Abd:      + bowel sounds; soft, non-tender; no palpable masses, no distension Ext:    No edema; adequate peripheral perfusion Skin:      Warm and dry; no rash Neuro: alert and oriented x 3 Psych: normal mood and affect   Data Reviewed: Imaging: CT chest screening 01/07/2018- small 3.4 mm right lower lobe lung nodule.  Mild emphysematous changes. CTA 03/06/2019-no pulmonary embolism or lung abnormality.  CT chest screening 03/22/2020- slightly smaller right lower lobe lung nodule measuring 2.6 mm, advanced atherosclerosis and aortic wall calcification. CT screening 03/24/2022-increase in right upper lobe groundglass nodule to 10 mm PET scan 04/03/2022-no significant FDG uptake I have reviewed the images personally  PFTs: 12/05/2019 FVC 2.47 [76%], FEV1 1.58 [63%], F/F 64, TLC 6.21 [121%], DLCO 18.45 [90%] Moderate obstruction  Labs: CBC 05/25/2017-WBC 10.3, eos 1%, absolute eosinophil count 103 CBC 08/22/2019-WBC 8.9, eos 1%, 30 significant count 89 IgE 05/27/2018-7 Hypersensitivity panel 05/27/2018-negative  Assessment:  Moderate COPD With mild exacerbation today Continue Symbicort, Spiriva, nebs Z-Pak, Pred 40 mg a day for 5 days for 5 days  Lung nodule Has increasing groundglass right upper lobe lung nodule.  PET scan is negative but this could still be a low-grade adenocarcinoma.  She has a follow-up CT ordered for 6 months.  Active smoker Smoking cessation discussed.  She is willing to quit.  Prescribe nicotine patches and Chantix Reassess at return visit.  Time spent counseling-5 minutes  Coronary atherosclerosis, aortic valve calcification Follows with Dr. Harl Bowie, cardiology  Plan/Recommendations: - Continue  Symbicort, Spiriva - Prednisone for 5 days, Z-Pak - Follow-up CT in 6 months - Smoking cessation with nicotine, Chantix  Marshell Garfinkel MD [facility] 04/25/2022, 3:22 PM  CC: Claretta Fraise, MD

## 2022-05-01 ENCOUNTER — Encounter: Payer: Self-pay | Admitting: Radiology

## 2022-05-05 ENCOUNTER — Encounter: Payer: Self-pay | Admitting: Nurse Practitioner

## 2022-05-05 ENCOUNTER — Telehealth (INDEPENDENT_AMBULATORY_CARE_PROVIDER_SITE_OTHER): Payer: 59 | Admitting: Nurse Practitioner

## 2022-05-05 DIAGNOSIS — U071 COVID-19: Secondary | ICD-10-CM

## 2022-05-05 MED ORDER — MOLNUPIRAVIR EUA 200MG CAPSULE: 4.0000 | ORAL_CAPSULE | Freq: Two times a day (BID) | ORAL | 0 refills | Status: AC

## 2022-05-05 MED ORDER — BENZONATATE 100 MG PO CAPS: 100.0000 mg | ORAL_CAPSULE | Freq: Three times a day (TID) | ORAL | 0 refills | Status: DC | PRN

## 2022-05-05 NOTE — Patient Instructions (Signed)
[name], thank you for joining Chevis Pretty, FNP for today's virtual visit.  While this provider is not your primary care provider (PCP), if your PCP is located in our provider database this encounter information will be shared with them immediately following your visit.   [facility] account gives you access to today's visit and all your visits, tests, and labs performed at [facility] " click here if you don't have a [facility] account or go to [email or web address]  Consent: (Patient) [name] provided verbal consent for this virtual visit at the beginning of the encounter.  Current Medications:  Current Outpatient Medications:    benzonatate (TESSALON PERLES) 100 MG capsule, Take 1 capsule (100 mg total) by mouth 3 (three) times daily as needed., Disp: 20 capsule, Rfl: 0   molnupiravir EUA (LAGEVRIO) 200 mg CAPS capsule, Take 4 capsules (800 mg total) by mouth 2 (two) times daily for 5 days., Disp: 40 capsule, Rfl: 0   acetaminophen (TYLENOL) 500 MG tablet, Take 1,000 mg by mouth every 6 (six) hours as needed for moderate pain., Disp: , Rfl:    albuterol (VENTOLIN HFA) 108 (90 Base) MCG/ACT inhaler, Inhale 2 puffs into the lungs every 6 (six) hours as needed for wheezing or shortness of breath., Disp: 8 g, Rfl: 5   amLODipine (NORVASC) 10 MG tablet, TAKE ONE TABLET BY MOUTH DAILY, Disp: 90 tablet, Rfl: 3   Ascorbic Acid (VITAMIN C) 1000 MG tablet, Take 1,000 mg by mouth daily., Disp: , Rfl:    atorvastatin (LIPITOR) 40 MG tablet, TAKE ONE TABLET BY MOUTH AT BEDTIME, Disp: 90 tablet, Rfl: 0   azithromycin (ZITHROMAX) 250 MG tablet, Take as directed, Disp: 6 tablet, Rfl: 0   budesonide-formoterol (SYMBICORT) 160-4.5 MCG/ACT inhaler, INHALE TWO PUFFS INTO THE LUNGS TWICE DAILY, Disp: 10.2 g, Rfl: 2   clonazePAM (KLONOPIN) 1 MG tablet, Take 1 tablet (1 mg total) by mouth 3 (three) times daily as needed. for anxiety, Disp: 90 tablet, Rfl:  2   dexlansoprazole (DEXILANT) 60 MG capsule, TAKE ONE CAPSULE BY MOUTH DAILY ON EMPTY STOMACH, THEN DO NOT EAT FOR ONE HOUR. (REPLACES PANTROPRAZOLE FOR REFLUX), Disp: 90 capsule, Rfl: 3   DULoxetine (CYMBALTA) 60 MG capsule, Take 1 capsule (60 mg total) by mouth 2 (two) times daily., Disp: 60 capsule, Rfl: 2   fexofenadine (ALLEGRA) 180 MG tablet, TAKE ONE TABLET BY MOUTH DAILY FOR ALLERGY SYMPTOMS (Patient taking differently: Take 180 mg by mouth daily.), Disp: 30 tablet, Rfl: 11   fluorometholone (FML) 0.1 % ophthalmic suspension, SMARTSIG:In Eye(s), Disp: , Rfl:    gabapentin (NEURONTIN) 600 MG tablet, Take 600 mg by mouth 3 (three) times daily., Disp: , Rfl:    ipratropium-albuterol (DUONEB) 0.5-2.5 (3) MG/3ML SOLN, Take 3 mLs by nebulization every 6 (six) hours as needed., Disp: 360 mL, Rfl: 5   lidocaine (LIDODERM) 5 %, Place 1 patch onto the skin daily. Remove & Discard patch within 12 hours or as directed by MD, Disp: 30 patch, Rfl: 0   lisinopril (ZESTRIL) 20 MG tablet, TAKE ONE TABLET BY MOUTH ONCE DAILY, Disp: 90 tablet, Rfl: 0   mupirocin ointment (BACTROBAN) 2 %, Apply 1 application. topically 2 (two) times daily., Disp: 22 g, Rfl: 0   naproxen sodium (ALEVE) 220 MG tablet, Take 220 mg by mouth 2 (two) times daily as needed (pain)., Disp: , Rfl:    nicotine (NICODERM CQ - DOSED IN MG/24 HOURS) 21 mg/24hr patch, Place 1 patch (  21 mg total) onto the skin daily., Disp: 28 patch, Rfl: 0   oxyCODONE (ROXICODONE) 15 MG immediate release tablet, Take 15 mg by mouth 4 (four) times daily as needed., Disp: , Rfl:    predniSONE (DELTASONE) 20 MG tablet, Take '40mg'$  for 5 days, Disp: 10 tablet, Rfl: 0   rOPINIRole (REQUIP) 2 MG tablet, Take 1 tablet (2 mg total) by mouth at bedtime. For leg cramps, Disp: 90 tablet, Rfl: 1   Tiotropium Bromide Monohydrate (SPIRIVA RESPIMAT) 2.5 MCG/ACT AERS, INHALE TWO PUFFS INTO THE LUNGS DAILY (NEEDS TO BE SEEN BEFORE NEXT REFILL), Disp: 4 g, Rfl: 5   Varenicline  Tartrate, Starter, (CHANTIX STARTING MONTH PAK) 0.5 MG X 11 & 1 MG X 42 TBPK, Take 1 0.5 mg tablet once daily for 3 days, increase to 1 0.5 mg tablet twice daily for 4 days, increase to 1 1 mg tablet twice daily., Disp: 53 each, Rfl: 0   Vitamin D, Ergocalciferol, (DRISDOL) 1.25 MG (50000 UNIT) CAPS capsule, TAKE ONE CAPSULE BY MOUTH ONCE WEEKLY, Disp: 13 capsule, Rfl: 3   Medications ordered in this encounter:  Meds ordered this encounter  Medications   molnupiravir EUA (LAGEVRIO) 200 mg CAPS capsule    Sig: Take 4 capsules (800 mg total) by mouth 2 (two) times daily for 5 days.    Dispense:  40 capsule    Refill:  0    Order Specific Question:   Supervising Provider    Answer:   Caryl Pina A [[id number]]   benzonatate (TESSALON PERLES) 100 MG capsule    Sig: Take 1 capsule (100 mg total) by mouth 3 (three) times daily as needed.    Dispense:  20 capsule    Refill:  0    Order Specific Question:   Supervising Provider    Answer:   Caryl Pina A [id number]     *If you need refills on other medications prior to your next appointment, please contact your pharmacy*  Follow-Up: Call back or seek an in-person evaluation if the symptoms worsen or if the condition fails to improve as anticipated.  [place]  Other Instructions 1. Take meds as prescribed 2. Use a cool mist humidifier especially during the winter months and when heat has been humid. 3. Use saline nose sprays frequently 4. Saline irrigations of the nose can be very helpful if done frequently.  * 4X daily for 1 week*  * Use of a nettie pot can be helpful with this. Follow directions with this* 5. Drink plenty of fluids 6. Keep thermostat turn down low 7.For any cough or congestion- tessalon perles 8. For fever or aces or pains- take tylenol or ibuprofen appropriate for age and weight.  * for fevers greater than 101 orally you may alternate ibuprofen and tylenol every  3  hours.      If you have been instructed to have an in-person evaluation today at a local Urgent Care facility, please use the link below. It will take you to a list of all of our available [facility], including address, phone number and hours of operation. Please do not delay care.  [facility]  If you or a family member do not have a primary care provider, use the link below to schedule a visit and establish care. When you choose a [facility] primary care physician or advanced practice provider, you gain a long-term partner in health. Find a Primary Care Provider  Learn  more about [facility] in-office and virtual care options: [facility]

## 2022-05-05 NOTE — Progress Notes (Signed)
Virtual Visit Consent   [name], you are scheduled for a virtual visit with Mary-Margaret Hassell Done, [place], a [facility] provider, today.     Just as with appointments in the office, your consent must be obtained to participate.  Your consent will be active for this visit and any virtual visit you may have with one of our providers in the next 365 days.     If you have a MyChart account, a copy of this consent can be sent to you electronically.  All virtual visits are billed to your insurance company just like a traditional visit in the office.    As this is a virtual visit, video technology does not allow for your provider to perform a traditional examination.  This may limit your provider's ability to fully assess your condition.  If your provider identifies any concerns that need to be evaluated in person or the need to arrange testing (such as labs, EKG, etc.), we will make arrangements to do so.     Although advances in technology are sophisticated, we cannot ensure that it will always work on either your end or our end.  If the connection with a video visit is poor, the visit may have to be switched to a telephone visit.  With either a video or telephone visit, we are not always able to ensure that we have a secure connection.     I need to obtain your verbal consent now.   Are you willing to proceed with your visit today? YES   [name] has provided verbal consent on 05/05/2022 for a virtual visit (video or telephone).   Mary-Margaret Hassell Done, FNP   Date: 05/05/2022 12:00 PM   Virtual Visit via Video Note   I, Mary-Margaret Hassell Done, connected with [name] ([id number], September 29, 1955) on 05/05/22 at  6:00 PM EST by a video-enabled telemedicine application and verified that I am speaking with the correct person using two identifiers.  Location: Patient: Virtual Visit Location Patient: Home Provider: Virtual Visit Location Provider: Mobile   I discussed the limitations of  evaluation and management by telemedicine and the availability of in person appointments. The patient expressed understanding and agreed to proceed.    History of Present Illness: [name] is a 67 y.o. who identifies as a female who was assigned female at birth, and is being seen today for covid positive.  HPI: URI  This is a new problem. Episode onset: friday. The problem has been waxing and waning. The maximum temperature recorded prior to her arrival was 100.4 - 100.9 F. The fever has been present for 1 to 2 days. Associated symptoms include congestion, coughing, headaches, rhinorrhea and a sore throat. She has tried acetaminophen for the symptoms. The treatment provided mild relief.   Covid test today positive Review of Systems  HENT:  Positive for congestion, rhinorrhea and sore throat.   Respiratory:  Positive for cough.   Neurological:  Positive for headaches.    Problems:  Patient Active Problem List   Diagnosis Date Noted   Back pain 03/29/2021   Hypokalemia 03/29/2021   Elevated d-dimer 03/29/2021   Leukocytosis 03/29/2021   Thrombocytosis 03/29/2021   Compression fracture of T5 vertebra (HCC) 03/29/2021   Pulmonary nodule 03/29/2021   Acute respiratory failure with hypoxia (HCC) 03/29/2021   GERD (gastroesophageal reflux disease) 03/29/2021   CAD (coronary artery disease) 03/29/2021   Anxiety 03/29/2021   COPD with acute exacerbation ([place]) 03/28/2021   Lumbar herniated  disc 12/10/2018   Major depression 04/02/2015   Opiate dependence ([place]) 01/22/2015   Hx of adenomatous polyp of colon 10/03/2014   Vitamin D deficiency 05/08/2014   COPD (chronic obstructive pulmonary disease) ([place]) 05/06/2011   Hyperlipidemia, unspecified 05/05/2011   INSOMNIA 11/13/2008   TOBACCO ABUSE 11/10/2008   Essential hypertension 11/10/2008   Osteoarthritis 11/10/2008    Allergies:  Allergies  Allergen Reactions   Lorcet 10-650 [Hydrocodone-Acetaminophen] Itching   Sulfa  Antibiotics Other (See Comments)    aching   Tape Other (See Comments)    Tear skin. Please use paper tape   Wellbutrin Xl [Bupropion Hcl Er (Xl)]     Upset stomach /bad taste in mouth   Medications:  Current Outpatient Medications:    acetaminophen (TYLENOL) 500 MG tablet, Take 1,000 mg by mouth every 6 (six) hours as needed for moderate pain., Disp: , Rfl:    albuterol (VENTOLIN HFA) 108 (90 Base) MCG/ACT inhaler, Inhale 2 puffs into the lungs every 6 (six) hours as needed for wheezing or shortness of breath., Disp: 8 g, Rfl: 5   amLODipine (NORVASC) 10 MG tablet, TAKE ONE TABLET BY MOUTH DAILY, Disp: 90 tablet, Rfl: 3   Ascorbic Acid (VITAMIN C) 1000 MG tablet, Take 1,000 mg by mouth daily., Disp: , Rfl:    atorvastatin (LIPITOR) 40 MG tablet, TAKE ONE TABLET BY MOUTH AT BEDTIME, Disp: 90 tablet, Rfl: 0   azithromycin (ZITHROMAX) 250 MG tablet, Take as directed, Disp: 6 tablet, Rfl: 0   budesonide-formoterol (SYMBICORT) 160-4.5 MCG/ACT inhaler, INHALE TWO PUFFS INTO THE LUNGS TWICE DAILY, Disp: 10.2 g, Rfl: 2   clonazePAM (KLONOPIN) 1 MG tablet, Take 1 tablet (1 mg total) by mouth 3 (three) times daily as needed. for anxiety, Disp: 90 tablet, Rfl: 2   dexlansoprazole (DEXILANT) 60 MG capsule, TAKE ONE CAPSULE BY MOUTH DAILY ON EMPTY STOMACH, THEN DO NOT EAT FOR ONE HOUR. (REPLACES PANTROPRAZOLE FOR REFLUX), Disp: 90 capsule, Rfl: 3   DULoxetine (CYMBALTA) 60 MG capsule, Take 1 capsule (60 mg total) by mouth 2 (two) times daily., Disp: 60 capsule, Rfl: 2   fexofenadine (ALLEGRA) 180 MG tablet, TAKE ONE TABLET BY MOUTH DAILY FOR ALLERGY SYMPTOMS (Patient taking differently: Take 180 mg by mouth daily.), Disp: 30 tablet, Rfl: 11   fluorometholone (FML) 0.1 % ophthalmic suspension, SMARTSIG:In Eye(s), Disp: , Rfl:    gabapentin (NEURONTIN) 600 MG tablet, Take 600 mg by mouth 3 (three) times daily., Disp: , Rfl:    ipratropium-albuterol (DUONEB) 0.5-2.5 (3) MG/3ML SOLN, Take 3 mLs by nebulization  every 6 (six) hours as needed., Disp: 360 mL, Rfl: 5   lidocaine (LIDODERM) 5 %, Place 1 patch onto the skin daily. Remove & Discard patch within 12 hours or as directed by MD, Disp: 30 patch, Rfl: 0   lisinopril (ZESTRIL) 20 MG tablet, TAKE ONE TABLET BY MOUTH ONCE DAILY, Disp: 90 tablet, Rfl: 0   mupirocin ointment (BACTROBAN) 2 %, Apply 1 application. topically 2 (two) times daily., Disp: 22 g, Rfl: 0   naproxen sodium (ALEVE) 220 MG tablet, Take 220 mg by mouth 2 (two) times daily as needed (pain)., Disp: , Rfl:    nicotine (NICODERM CQ - DOSED IN MG/24 HOURS) 21 mg/24hr patch, Place 1 patch (21 mg total) onto the skin daily., Disp: 28 patch, Rfl: 0   oxyCODONE (ROXICODONE) 15 MG immediate release tablet, Take 15 mg by mouth 4 (four) times daily as needed., Disp: , Rfl:    predniSONE (DELTASONE) 20  MG tablet, Take '40mg'$  for 5 days, Disp: 10 tablet, Rfl: 0   rOPINIRole (REQUIP) 2 MG tablet, Take 1 tablet (2 mg total) by mouth at bedtime. For leg cramps, Disp: 90 tablet, Rfl: 1   Tiotropium Bromide Monohydrate (SPIRIVA RESPIMAT) 2.5 MCG/ACT AERS, INHALE TWO PUFFS INTO THE LUNGS DAILY (NEEDS TO BE SEEN BEFORE NEXT REFILL), Disp: 4 g, Rfl: 5   Varenicline Tartrate, Starter, (CHANTIX STARTING MONTH PAK) 0.5 MG X 11 & 1 MG X 42 TBPK, Take 1 0.5 mg tablet once daily for 3 days, increase to 1 0.5 mg tablet twice daily for 4 days, increase to 1 1 mg tablet twice daily., Disp: 53 each, Rfl: 0   Vitamin D, Ergocalciferol, (DRISDOL) 1.25 MG (50000 UNIT) CAPS capsule, TAKE ONE CAPSULE BY MOUTH ONCE WEEKLY, Disp: 13 capsule, Rfl: 3  Observations/Objective: Patient is well-developed, well-nourished in no acute distress.  Resting comfortably  at home.  Head is normocephalic, atraumatic.  No labored breathing.  Speech is clear and coherent with logical content.  Patient is alert and oriented at baseline.  Raspy voice Deep cough  Assessment and Plan:  [name] in today with chief complaint of No  chief complaint on file.   1. Positive self-administered antigen test for COVID-19 1. Take meds as prescribed 2. Use a cool mist humidifier especially during the winter months and when heat has been humid. 3. Use saline nose sprays frequently 4. Saline irrigations of the nose can be very helpful if done frequently.  * 4X daily for 1 week*  * Use of a nettie pot can be helpful with this. Follow directions with this* 5. Drink plenty of fluids 6. Keep thermostat turn down low 7.For any cough or congestion- tessalon perles 8. For fever or aces or pains- take tylenol or ibuprofen appropriate for age and weight.  * for fevers greater than 101 orally you may alternate ibuprofen and tylenol every  3 hours.   If SOB worsens need to go to the ED  Meds ordered this encounter  Medications   molnupiravir EUA (LAGEVRIO) 200 mg CAPS capsule    Sig: Take 4 capsules (800 mg total) by mouth 2 (two) times daily for 5 days.    Dispense:  40 capsule    Refill:  0    Order Specific Question:   Supervising Provider    Answer:   Caryl Pina A [[id number]]   benzonatate (TESSALON PERLES) 100 MG capsule    Sig: Take 1 capsule (100 mg total) by mouth 3 (three) times daily as needed.    Dispense:  20 capsule    Refill:  0    Order Specific Question:   Supervising Provider    Answer:   Caryl Pina A [id number]       Follow Up Instructions: I discussed the assessment and treatment plan with the patient. The patient was provided an opportunity to ask questions and all were answered. The patient agreed with the plan and demonstrated an understanding of the instructions.  A copy of instructions were sent to the patient via MyChart.  The patient was advised to call back or seek an in-person evaluation if the symptoms worsen or if the condition fails to improve as anticipated.  Time:  I spent 8 minutes with the patient via telehealth technology discussing the above problems/concerns.     Mary-Margaret Hassell Done, FNP

## 2022-05-21 DIAGNOSIS — M6281 Muscle weakness (generalized): Secondary | ICD-10-CM | POA: Diagnosis not present

## 2022-05-24 ENCOUNTER — Other Ambulatory Visit: Payer: Self-pay | Admitting: Family Medicine

## 2022-05-24 DIAGNOSIS — J439 Emphysema, unspecified: Secondary | ICD-10-CM

## 2022-05-28 ENCOUNTER — Other Ambulatory Visit: Payer: Self-pay | Admitting: Family Medicine

## 2022-05-29 ENCOUNTER — Other Ambulatory Visit: Payer: Self-pay | Admitting: Family Medicine

## 2022-06-03 ENCOUNTER — Ambulatory Visit: Payer: 59 | Admitting: Orthopedic Surgery

## 2022-06-12 DIAGNOSIS — M7918 Myalgia, other site: Secondary | ICD-10-CM | POA: Diagnosis not present

## 2022-06-12 DIAGNOSIS — Z79899 Other long term (current) drug therapy: Secondary | ICD-10-CM | POA: Diagnosis not present

## 2022-06-17 ENCOUNTER — Encounter: Payer: Self-pay | Admitting: Family Medicine

## 2022-06-17 ENCOUNTER — Ambulatory Visit (INDEPENDENT_AMBULATORY_CARE_PROVIDER_SITE_OTHER): Payer: 59 | Admitting: Family Medicine

## 2022-06-17 VITALS — BP 136/79 | HR 80 | Temp 97.9°F | Ht 65.0 in | Wt 143.2 lb

## 2022-06-17 DIAGNOSIS — K2100 Gastro-esophageal reflux disease with esophagitis, without bleeding: Secondary | ICD-10-CM | POA: Diagnosis not present

## 2022-06-17 DIAGNOSIS — J301 Allergic rhinitis due to pollen: Secondary | ICD-10-CM

## 2022-06-17 DIAGNOSIS — J439 Emphysema, unspecified: Secondary | ICD-10-CM | POA: Diagnosis not present

## 2022-06-17 DIAGNOSIS — I10 Essential (primary) hypertension: Secondary | ICD-10-CM | POA: Diagnosis not present

## 2022-06-17 DIAGNOSIS — D485 Neoplasm of uncertain behavior of skin: Secondary | ICD-10-CM | POA: Diagnosis not present

## 2022-06-17 DIAGNOSIS — E782 Mixed hyperlipidemia: Secondary | ICD-10-CM | POA: Diagnosis not present

## 2022-06-17 DIAGNOSIS — R251 Tremor, unspecified: Secondary | ICD-10-CM

## 2022-06-17 DIAGNOSIS — K5903 Drug induced constipation: Secondary | ICD-10-CM | POA: Diagnosis not present

## 2022-06-17 MED ORDER — FEXOFENADINE HCL 180 MG PO TABS: 180.0000 mg | ORAL_TABLET | Freq: Every day | ORAL | 3 refills | Status: DC

## 2022-06-17 MED ORDER — DEXLANSOPRAZOLE 60 MG PO CPDR: DELAYED_RELEASE_CAPSULE | ORAL | 2 refills | Status: DC

## 2022-06-17 MED ORDER — SPIRIVA RESPIMAT 2.5 MCG/ACT IN AERS: INHALATION_SPRAY | RESPIRATORY_TRACT | 5 refills | Status: DC

## 2022-06-17 MED ORDER — LINACLOTIDE 145 MCG PO CAPS
145.0000 ug | ORAL_CAPSULE | Freq: Every day | ORAL | 5 refills | Status: DC
Start: 2022-06-17 — End: 2022-12-08

## 2022-06-17 MED ORDER — ATORVASTATIN CALCIUM 40 MG PO TABS: 40.0000 mg | ORAL_TABLET | Freq: Every day | ORAL | 3 refills | Status: DC

## 2022-06-17 MED ORDER — BUDESONIDE-FORMOTEROL FUMARATE 160-4.5 MCG/ACT IN AERO: INHALATION_SPRAY | RESPIRATORY_TRACT | 11 refills | Status: DC

## 2022-06-17 MED ORDER — ROPINIROLE HCL 3 MG PO TABS: 3.0000 mg | ORAL_TABLET | Freq: Every day | ORAL | 1 refills | Status: DC

## 2022-06-17 MED ORDER — LISINOPRIL 20 MG PO TABS: 20.0000 mg | ORAL_TABLET | Freq: Every day | ORAL | 3 refills | Status: DC

## 2022-06-17 NOTE — Progress Notes (Signed)
Subjective:  Patient ID: [name], female    DOB: Oct 31, 1955  Age: 67 y.o. MRN: [id number]  CC: Medical Management of Chronic Issues   HPI [name] presents for dizziness, shaking. Right leg jerking & felt like rubber once 2 months ago. Legs & hands shake. Lasts 5 min. Occurring 5 times in the last 2 months.  Patient in for follow-up of GERD. Currently asymptomatic taking  PPI daily. However can't tolerate lemonade and spicy foods.  There is no chest pain or heartburn. No hematemesis and no melena. No dysphagia or choking. Onset is remote. Progression is stable. Complicating factors, none. Seeing Pain clinic for chronic pain syndrome. She takes oxycodone and is experiencing constipation. Sometimes Bms are a week apart.  Using multiple inhalers to help with breathing. Stable currently.  She has had multiple skin cancers removed, but her derm has left his local practice. She needs a referral. There are a large number of lesions growing over head, trunl and extremities. Concerned for Ca. Especially the one on the upper right thigh.  presents for  follow-up of hypertension. Patient has no history of headache chest pain or shortness of breath or recent cough. Patient also denies symptoms of TIA such as focal numbness or weakness. Patient denies side effects from medication. States taking it regularly.  in for follow-up of elevated cholesterol. Doing well without complaints on current medication. Denies side effects of statin including myalgia and arthralgia and nausea. Currently no chest pain, shortness of breath or other cardiovascular related symptoms noted.      06/17/2022    1:15 PM 06/17/2022    1:07 PM 04/16/2022    8:03 PM  Depression screen PHQ 2/9  Decreased Interest 1 0 2  Down, Depressed, Hopeless 1 0 2  PHQ - 2 Score 2 0 4  Altered sleeping 1  2  Tired, decreased energy 1  3  Change in appetite 0  0  Feeling bad or failure about yourself  0  0  Trouble concentrating 0   2  Moving slowly or fidgety/restless 0  0  Suicidal thoughts 0  0  PHQ-9 Score 4  11  Difficult doing work/chores Very difficult      History Sybol has a past medical history of Allergy, Arthritis, Atypical mole (06/05/2003), Atypical nevus (07/26/2003), Basal cell carcinoma (06/05/2003), Basal cell carcinoma (05/22/2004), Basal cell carcinoma (11/27/2004), Basal cell carcinoma (11/08/2009), Basal cell carcinoma (05/24/2012), Basal cell carcinoma (06/24/2012), Basal cell carcinoma (07/29/2012), Basal cell carcinoma (06/06/2019), Cataract, Closed compression fracture of L5 vertebra, Constipation, COPD (chronic obstructive pulmonary disease), Coronary artery disease, COVID, Depression, GERD (gastroesophageal reflux disease), Heart murmur, adenomatous polyp of colon (10/03/2014), Hypercholesteremia, Hypertension, Panic attacks, Perimenopausal, Skin cancer, Squamous cell carcinoma of skin (06/05/2003), Squamous cell carcinoma of skin (05/22/2004), Squamous cell carcinoma of skin (11/27/2004), Squamous cell carcinoma of skin (03/04/2006), Squamous cell carcinoma of skin (11/26/2006), Squamous cell carcinoma of skin (05/24/2012), Squamous cell carcinoma of skin (04/05/2013), Squamous cell carcinoma of skin (08/17/2013), Squamous cell carcinoma of skin (07/30/2016), Squamous cell carcinoma of skin (05/12/2017), Squamous cell carcinoma of skin (11/23/2018), and Tobacco user.   She has a past surgical history that includes Abdominal hysterectomy; vocal cord; Right foot toe surgery; Colonoscopy; Upper gastrointestinal endoscopy; Cataract extraction w/PHACO (Right, 03/01/2021); IR VERTEBROPLASTY CERV/THOR BX INC UNI/BIL INC/INJECT/IMAGING (04/16/2021); and Cataract extraction w/PHACO (Left, 05/16/2021).   Her family history includes Alcohol abuse in her brother; Anxiety disorder in her brother and sister; Breast cancer in her niece;  Colon cancer (age of onset: 6) in her brother; Colon polyps in her brother, brother,  sister, and sister; Depression in her brother and sister; Drug abuse in her son; Esophageal cancer in her sister; Heart attack in her brother, father, and mother; Heart disease in her mother.She reports that she has been smoking cigarettes. She started smoking about 53 years ago. She has a 40.00 pack-year smoking history. She has never used smokeless tobacco. She reports that she does not drink alcohol and does not use drugs.    ROS Review of Systems  Constitutional: Negative.   HENT: Negative.    Eyes:  Negative for visual disturbance.  Respiratory:  Negative for shortness of breath.   Cardiovascular:  Negative for chest pain.  Gastrointestinal:  Negative for abdominal pain.  Musculoskeletal:  Negative for arthralgias.  Neurological:  Positive for dizziness and tremors.    Objective:  BP 136/79   Pulse 80   Temp 97.9 F (36.6 C)   Ht  (1.651 m)   Wt 143 lb 3.2 oz (65 kg)   SpO2 93%   BMI 23.83 kg/m   BP Readings from Last 3 Encounters:  06/17/22 136/79  04/25/22 118/78  12/16/21 121/68    Wt Readings from Last 3 Encounters:  06/17/22 143 lb 3.2 oz (65 kg)  04/25/22 146 lb 3.2 oz (66.3 kg)  04/16/22 147 lb (66.7 kg)     Physical Exam Constitutional:      General: She is not in acute distress.    Appearance: She is well-developed.  Cardiovascular:     Rate and Rhythm: Normal rate and regular rhythm.  Pulmonary:     Breath sounds: Normal breath sounds.  Musculoskeletal:        General: Normal range of motion.  Skin:    General: Skin is warm and dry.     Findings: Lesion (widespread seb K, moles & other lesions) present.  Neurological:     Mental Status: She is alert and oriented to person, place, and time.       Assessment & Plan:   Ephrata was seen today for medical management of chronic issues.  Diagnoses and all orders for this visit:  Essential hypertension -     CBC with Differential/Platelet -     CMP14+EGFR -     lisinopril (ZESTRIL) 20 MG  tablet; Take 1 tablet (20 mg total) by mouth daily.  Mixed hyperlipidemia -     Lipid panel -     atorvastatin (LIPITOR) 40 MG tablet; Take 1 tablet (40 mg total) by mouth at bedtime.  Pulmonary emphysema, unspecified emphysema type -     budesonide-formoterol (SYMBICORT) 160-4.5 MCG/ACT inhaler; INHALE TWO PUFFS INTO THE LUNGS TWICE DAILY -     Tiotropium Bromide Monohydrate (SPIRIVA RESPIMAT) 2.5 MCG/ACT AERS; INHALE TWO PUFFS INTO THE LUNGS DAILY (  Drug-induced constipation -     linaclotide (LINZESS) 145 MCG CAPS capsule; Take 1 capsule (145 mcg total) by mouth daily. To regulate bowel movements  Neoplasm of uncertain behavior of skin -     Ambulatory referral to Dermatology  Tremor -     rOPINIRole (REQUIP) 3 MG tablet; Take 1 tablet (3 mg total) by mouth at bedtime. (NEEDS TO BE SEEN BEFORE NEXT REFILL)  Gastroesophageal reflux disease with esophagitis without hemorrhage -     dexlansoprazole (DEXILANT) 60 MG capsule; TAKE ONE CAPSULE BY MOUTH DAILY ON EMPTY STOMACH  Seasonal allergic rhinitis due to pollen -     fexofenadine (  ALLEGRA) 180 MG tablet; Take 1 tablet (180 mg total) by mouth daily.       I have discontinued Coretha M. Ozga's fluorometholone, mupirocin ointment, azithromycin, predniSONE, nicotine, and benzonatate. I have also changed her atorvastatin, lisinopril, dexlansoprazole, fexofenadine, rOPINIRole, and Spiriva Respimat. Additionally, I am having her start on linaclotide. Lastly, I am having her maintain her vitamin C, ipratropium-albuterol, oxyCODONE, albuterol, lidocaine, gabapentin, acetaminophen, naproxen sodium, Vitamin D (Ergocalciferol), amLODipine, DULoxetine, clonazePAM, Varenicline Tartrate (Starter), and budesonide-formoterol.  Allergies as of 06/17/2022       Reactions   Lorcet 10-650 [hydrocodone-acetaminophen] Itching   Sulfa Antibiotics Other (See Comments)   aching   Tape Other (See Comments)   Tear skin. Please use paper tape    Wellbutrin Xl [bupropion Hcl Er (xl)]    Upset stomach /bad taste in mouth        Medication List        Accurate as of June 17, 2022  2:01 PM. If you have any questions, ask your nurse or doctor.          STOP taking these medications    azithromycin 250 MG tablet Commonly known as: ZITHROMAX Stopped by: Mechele Claude, MD   benzonatate 100 MG capsule Commonly known as: Lawyer Stopped by: Mechele Claude, MD   fluorometholone 0.1 % ophthalmic suspension Commonly known as: FML Stopped by: Mechele Claude, MD   mupirocin ointment 2 % Commonly known as: BACTROBAN Stopped by: Mechele Claude, MD   nicotine 21 mg/24hr patch Commonly known as: NICODERM CQ - dosed in mg/24 hours Stopped by: Mechele Claude, MD   predniSONE 20 MG tablet Commonly known as: DELTASONE Stopped by: Mechele Claude, MD       TAKE these medications    acetaminophen 500 MG tablet Commonly known as: TYLENOL Take 1,000 mg by mouth every 6 (six) hours as needed for moderate pain.   albuterol 108 (90 Base) MCG/ACT inhaler Commonly known as: VENTOLIN HFA Inhale 2 puffs into the lungs every 6 (six) hours as needed for wheezing or shortness of breath.   amLODipine 10 MG tablet Commonly known as: NORVASC TAKE ONE TABLET BY MOUTH DAILY   atorvastatin 40 MG tablet Commonly known as: LIPITOR Take 1 tablet (40 mg total) by mouth at bedtime.   budesonide-formoterol 160-4.5 MCG/ACT inhaler Commonly known as: Symbicort INHALE TWO PUFFS INTO THE LUNGS TWICE DAILY   clonazePAM 1 MG tablet Commonly known as: KLONOPIN Take 1 tablet (1 mg total) by mouth 3 (three) times daily as needed. for anxiety   dexlansoprazole 60 MG capsule Commonly known as: DEXILANT TAKE ONE CAPSULE BY MOUTH DAILY ON EMPTY STOMACH What changed: See the new instructions. Changed by: Mechele Claude, MD   DULoxetine 60 MG capsule Commonly known as: CYMBALTA Take 1 capsule (60 mg total) by mouth 2 (two) times daily.    fexofenadine 180 MG tablet Commonly known as: ALLEGRA Take 1 tablet (180 mg total) by mouth daily. What changed: See the new instructions. Changed by: Mechele Claude, MD   gabapentin 600 MG tablet Commonly known as: NEURONTIN Take 600 mg by mouth 3 (three) times daily.   ipratropium-albuterol 0.5-2.5 (3) MG/3ML Soln Commonly known as: DUONEB Take 3 mLs by nebulization every 6 (six) hours as needed.   lidocaine 5 % Commonly known as: Lidoderm Place 1 patch onto the skin daily. Remove & Discard patch within 12 hours or as directed by MD   linaclotide 145 MCG Caps capsule Commonly known as: Linzess Take 1 capsule (  145 mcg total) by mouth daily. To regulate bowel movements Started by: Mechele Claude, MD   lisinopril 20 MG tablet Commonly known as: ZESTRIL Take 1 tablet (20 mg total) by mouth daily.   naproxen sodium 220 MG tablet Commonly known as: ALEVE Take 220 mg by mouth 2 (two) times daily as needed (pain).   oxyCODONE 15 MG immediate release tablet Commonly known as: ROXICODONE Take 15 mg by mouth 4 (four) times daily as needed.   rOPINIRole 3 MG tablet Commonly known as: REQUIP Take 1 tablet (3 mg total) by mouth at bedtime. (NEEDS TO BE SEEN BEFORE NEXT REFILL) What changed:  medication strength how much to take Changed by: Mechele Claude, MD   Spiriva Respimat 2.5 MCG/ACT Aers Generic drug: Tiotropium Bromide Monohydrate INHALE TWO PUFFS INTO THE LUNGS DAILY ( What changed: additional instructions Changed by: Mechele Claude, MD   Varenicline Tartrate (Starter) 0.5 MG X 11 & 1 MG X 42 Tbpk Commonly known as: Chantix Starting Month Pak Take 1 0.5 mg tablet once daily for 3 days, increase to 1 0.5 mg tablet twice daily for 4 days, increase to 1 1 mg tablet twice daily.   vitamin C 1000 MG tablet Take 1,000 mg by mouth daily.   Vitamin D (Ergocalciferol) 1.25 MG (50000 UNIT) Caps capsule Commonly known as: DRISDOL TAKE ONE CAPSULE BY MOUTH ONCE WEEKLY          Follow-up: Return in about 6 months (around 12/17/2022), or if symptoms worsen or fail to improve.  Mechele Claude, M.D.

## 2022-06-18 LAB — CMP14+EGFR
ALT: 11 IU/L (ref 0–32)
AST: 11 IU/L (ref 0–40)
Albumin/Globulin Ratio: 1.8 (ref 1.2–2.2)
Albumin: 4.2 g/dL (ref 3.9–4.9)
Alkaline Phosphatase: 90 IU/L (ref 44–121)
BUN/Creatinine Ratio: 17 (ref 12–28)
BUN: 13 mg/dL (ref 8–27)
Bilirubin Total: 0.2 mg/dL (ref 0.0–1.2)
CO2: 26 mmol/L (ref 20–29)
Calcium: 9.6 mg/dL (ref 8.7–10.3)
Chloride: 103 mmol/L (ref 96–106)
Creatinine, Ser: 0.76 mg/dL (ref 0.57–1.00)
Globulin, Total: 2.4 g/dL (ref 1.5–4.5)
Glucose: 90 mg/dL (ref 70–99)
Potassium: 4.6 mmol/L (ref 3.5–5.2)
Sodium: 143 mmol/L (ref 134–144)
Total Protein: 6.6 g/dL (ref 6.0–8.5)
eGFR: 85 mL/min/{1.73_m2} (ref >59)

## 2022-06-18 LAB — LIPID PANEL
Chol/HDL Ratio: 3.7 ratio (ref 0.0–4.4)
Cholesterol, Total: 191 mg/dL (ref 100–199)
HDL: 51 mg/dL (ref >39)
LDL Chol Calc (NIH): 112 mg/dL — ABNORMAL HIGH (ref 0–99)
Triglycerides: 157 mg/dL — ABNORMAL HIGH (ref 0–149)
VLDL Cholesterol Cal: 28 mg/dL (ref 5–40)

## 2022-06-18 LAB — CBC WITH DIFFERENTIAL/PLATELET
Basophils Absolute: 0 10*3/uL (ref 0.0–0.2)
Basos: 0 %
EOS (ABSOLUTE): 0.1 10*3/uL (ref 0.0–0.4)
Eos: 1 %
Hematocrit: 46.3 % (ref 34.0–46.6)
Hemoglobin: 15.1 g/dL (ref 11.1–15.9)
Immature Grans (Abs): 0.1 10*3/uL (ref 0.0–0.1)
Immature Granulocytes: 1 %
Lymphocytes Absolute: 2.4 10*3/uL (ref 0.7–3.1)
Lymphs: 24 %
MCH: 28.8 pg (ref 26.6–33.0)
MCHC: 32.6 g/dL (ref 31.5–35.7)
MCV: 88 fL (ref 79–97)
Monocytes Absolute: 1 10*3/uL — ABNORMAL HIGH (ref 0.1–0.9)
Monocytes: 10 %
Neutrophils Absolute: 6.6 10*3/uL (ref 1.4–7.0)
Neutrophils: 64 %
Platelets: 448 10*3/uL (ref 150–450)
RBC: 5.25 x10E6/uL (ref 3.77–5.28)
RDW: 16.2 % — ABNORMAL HIGH (ref 11.7–15.4)
WBC: 10.3 10*3/uL (ref 3.4–10.8)

## 2022-06-18 NOTE — Progress Notes (Signed)
Hello [place]ifa, ? ?Your lab result is normal and/or stable.Some minor variations that are not significant are commonly marked abnormal, but do not represent any medical problem for you. ? ?Best regards, ?Bobie Caris, M.D.

## 2022-06-19 ENCOUNTER — Other Ambulatory Visit: Payer: Self-pay | Admitting: Family Medicine

## 2022-06-19 ENCOUNTER — Telehealth: Payer: Self-pay

## 2022-06-19 MED ORDER — SYMBICORT 160-4.5 MCG/ACT IN AERO: 2.0000 | INHALATION_SPRAY | Freq: Two times a day (BID) | RESPIRATORY_TRACT | 12 refills | Status: DC

## 2022-06-19 NOTE — Telephone Encounter (Signed)
Per insurance, brand name Symbicort does not require a prior authorization.  Please send new script as brand name.  The following alternatives are the preferred alternatives: ADVAIR DISKUS OR FLUTICASONE PROPIONATE/SALMETEROL OR WIXELA INHUB, BREO ELLIPTA, SYMBICORT. Would you like to switch to the provided preferred alternatives? Please note: Brand Symbicort is the preferred covered alternative and will process at a lower tier without the need of a Prior Authorization.*

## 2022-06-19 NOTE — Telephone Encounter (Signed)
Please let the patient know that I sent their prescription to their pharmacy. Thanks, WS 

## 2022-06-20 DIAGNOSIS — M6281 Muscle weakness (generalized): Secondary | ICD-10-CM | POA: Diagnosis not present

## 2022-07-05 ENCOUNTER — Other Ambulatory Visit (HOSPITAL_COMMUNITY): Payer: Self-pay | Admitting: Psychiatry

## 2022-07-05 DIAGNOSIS — F411 Generalized anxiety disorder: Secondary | ICD-10-CM

## 2022-07-17 DIAGNOSIS — M5417 Radiculopathy, lumbosacral region: Secondary | ICD-10-CM | POA: Diagnosis not present

## 2022-07-21 ENCOUNTER — Telehealth (INDEPENDENT_AMBULATORY_CARE_PROVIDER_SITE_OTHER): Payer: 59 | Admitting: Psychiatry

## 2022-07-21 ENCOUNTER — Encounter (HOSPITAL_COMMUNITY): Payer: Self-pay | Admitting: Psychiatry

## 2022-07-21 DIAGNOSIS — F411 Generalized anxiety disorder: Secondary | ICD-10-CM | POA: Diagnosis not present

## 2022-07-21 DIAGNOSIS — F332 Major depressive disorder, recurrent severe without psychotic features: Secondary | ICD-10-CM | POA: Diagnosis not present

## 2022-07-21 MED ORDER — CLONIDINE HCL 0.1 MG PO TABS: 0.1000 mg | ORAL_TABLET | Freq: Three times a day (TID) | ORAL | 0 refills | Status: DC

## 2022-07-21 MED ORDER — CLONAZEPAM 1 MG PO TABS: 1.0000 mg | ORAL_TABLET | Freq: Three times a day (TID) | ORAL | 2 refills | Status: DC | PRN

## 2022-07-21 MED ORDER — ONDANSETRON HCL 4 MG PO TABS: 4.0000 mg | ORAL_TABLET | Freq: Every day | ORAL | 0 refills | Status: DC | PRN

## 2022-07-21 MED ORDER — DULOXETINE HCL 60 MG PO CPEP: 60.0000 mg | ORAL_CAPSULE | Freq: Two times a day (BID) | ORAL | 2 refills | Status: DC

## 2022-07-21 NOTE — Progress Notes (Signed)
Virtual Visit via Telephone Note  I connected with [name] on 07/21/22 at  1:40 PM EDT by telephone and verified that I am speaking with the correct person using two identifiers.  Location: Patient: home Provider: office   I discussed the limitations, risks, security and privacy concerns of performing an evaluation and management service by telephone and the availability of in person appointments. I also discussed with the patient that there may be a patient responsible charge related to this service. The patient expressed understanding and agreed to proceed.     I discussed the assessment and treatment plan with the patient. The patient was provided an opportunity to ask questions and all were answered. The patient agreed with the plan and demonstrated an understanding of the instructions.   The patient was advised to call back or seek an in-person evaluation if the symptoms worsen or if the condition fails to improve as anticipated.  I provided 15 minutes of non-face-to-face time during this encounter.   Diannia Ruder, MD  [facility] MD/PA/NP OP Progress Note  07/21/2022 2:00 PM [name]  MRN:  [id number]  Chief Complaint:  Chief Complaint  Patient presents with   Depression   Anxiety   Follow-up   HPI: This patient is a 67 year old female who is widowed and lives alone in [place].  She is on disability.  The patient returns for follow-up after 3 months regarding her anxiety and depression.  She states that her son who is living next-door was selling drugs and the place got rated in the police arrested him.  He is currently now in prison.  She states that she went to clean up afterwards and when she went to her pain management office her drug test showed up positive for fentanyl.  She is thinks that she got this in her skin when she was cleaning up some of his tables.  Her pain management doctor now has cut off the oxycodone about 3 days ago.  She is having withdrawal symptoms  such as tremor nausea shakiness.  I told her I could send in some clonidine and Zofran to help with the withdrawal symptoms.  She is not sure he will take her back for pain management.  The patient is somewhat overwhelmed with the withdrawal symptoms right now and and it is hard to tell about the depression and anxiety but she still thinks the medications have helped.  Urged her to go to the emergency room or urgent care center if her withdrawal symptoms get worse.  She is keeping fluids down. Visit Diagnosis:    ICD-10-CM   1. GAD (generalized anxiety disorder)  F41.1 clonazePAM (KLONOPIN) 1 MG tablet    2. Severe episode of recurrent major depressive disorder, without psychotic features (HCC)  F33.2 DULoxetine (CYMBALTA) 60 MG capsule      Past Psychiatric History: none  Past Medical History:  Past Medical History:  Diagnosis Date   Allergy    Arthritis    Atypical mole 06/05/2003   slight-mod-Left scapula (WS)   Atypical nevus 07/26/2003   persist. dysp- Left scapula (WS)   Basal cell carcinoma 06/05/2003   RIght chest (CX35FU)   Basal cell carcinoma 05/22/2004   superificial- RIght upper back- (CX35FU)   Basal cell carcinoma 11/27/2004   beside R nostril-(MOHS), below right outer nose (MOHS)   Basal cell carcinoma 11/08/2009   left post shoulder -(txpbx)   Basal cell carcinoma 05/24/2012   ulcerated- Left post shoulder- (CX35FU), ulcerated-mid forehead (EXC )  Basal cell carcinoma 06/24/2012   superificial- Right shoulder - (txpbx)    Basal cell carcinoma 07/29/2012    sclerosis- mid forehead (MOHS)   Basal cell carcinoma 06/06/2019   nod-right anterior neck (CX35FU)   Cataract    Closed compression fracture of L5 vertebra (HCC)    Constipation    uses OTC meds with help   COPD (chronic obstructive pulmonary disease) (HCC)    Coronary artery disease    minimal nonobstructive    COVID    Depression    GERD (gastroesophageal reflux disease)    Heart murmur    Hx of  adenomatous polyp of colon 10/03/2014   Hypercholesteremia    Hypertension    Panic attacks    Perimenopausal    Skin cancer    Squamous cell carcinoma of skin 06/05/2003   Right [place](CX35FU)   Squamous cell carcinoma of skin 05/22/2004   in situ- Left upperarm (CX35FU)   Squamous cell carcinoma of skin 11/27/2004   in situ- above Left elbow (CX35FU)   Squamous cell carcinoma of skin 03/04/2006   in situ- Left sideburn (CX35FU)   Squamous cell carcinoma of skin 11/26/2006   in situ- mid brow (Cx35FU)   Squamous cell carcinoma of skin 05/24/2012   in situ- Left chest- (CX35FU)   Squamous cell carcinoma of skin 04/05/2013   in situ- Left nose (Cx35FU)   Squamous cell carcinoma of skin 08/17/2013   well diff-above Left eye (txpbx)   Squamous cell carcinoma of skin 07/30/2016   in situ- right chest sup (Cx35FU)   Squamous cell carcinoma of skin 05/12/2017   in situ- RIght shoulder (CX35FU), in situ- Left forehead (CX35FU)   Squamous cell carcinoma of skin 11/23/2018   in situ- Right mid shin, inner- (MOHS)   Tobacco user     Past Surgical History:  Procedure Laterality Date   ABDOMINAL HYSTERECTOMY     CATARACT EXTRACTION W/PHACO Right 03/01/2021   Procedure: CATARACT EXTRACTION PHACO AND INTRAOCULAR LENS PLACEMENT (IOC);  Surgeon: Fabio Pierce, MD;  Location: [facility];  Service: Ophthalmology;  Laterality: Right;  CDE: 7.31   CATARACT EXTRACTION W/PHACO Left 05/16/2021   Procedure: CATARACT EXTRACTION PHACO AND INTRAOCULAR LENS PLACEMENT (IOC);  Surgeon: Fabio Pierce, MD;  Location: [facility];  Service: Ophthalmology;  Laterality: Left;  CDE 9.41    COLONOSCOPY     IR VERTEBROPLASTY CERV/THOR BX INC UNI/BIL INC/INJECT/IMAGING  04/16/2021   Right foot toe surgery     UPPER GASTROINTESTINAL ENDOSCOPY     vocal cord     polyp removal    Family Psychiatric History: see below  Family History:  Family History  Problem Relation Age of Onset   Heart disease Mother    Heart  attack Mother    Heart attack Father    Colon polyps Sister    Esophageal cancer Sister    Anxiety disorder Sister    Depression Sister    Colon polyps Sister    Heart attack Brother    Colon cancer Brother 77       died at age 40   Anxiety disorder Brother    Depression Brother    Alcohol abuse Brother    Colon polyps Brother    Colon polyps Brother    Drug abuse Son    Breast cancer Niece    Rectal cancer Neg Hx    Stomach cancer Neg Hx     Social History:  Social History   Socioeconomic History   Marital  status: Single    Spouse name: Not on file   Number of children: 2   Years of education: Not on file   Highest education level: Not on file  Occupational History   Occupation: Conservation officer, 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   Sexual activity: Not on file  Other Topics Concern   Not on file  Social History Narrative   Retired/ lives alone in one level home   Son and daughter in law that live next door and help her out a lot         Social Determinants of Health   Financial Resource Strain: Medium Risk (04/16/2022)   Overall Financial Resource Strain (CARDIA)    Difficulty of Paying Living Expenses: Somewhat hard  Food Insecurity: No Food Insecurity (04/16/2022)   Hunger Vital Sign    Worried About Running Out of Food in the Last Year: Never true    Ran Out of Food in the Last Year: Never true  Transportation Needs: No Transportation Needs (04/16/2022)   PRAPARE - Scientist, research (physical sciences) (Medical): No    Lack of Transportation (Non-Medical): No  Physical Activity: Inactive (04/16/2022)   Exercise Vital Sign    Days of Exercise per Week: 0 days    Minutes of Exercise per Session: 0 min  Stress: Stress Concern Present (04/16/2022)   [employer] of Occupational Health - Occupational Stress Questionnaire    Feeling of Stress : To some extent  Social Connections: Socially Isolated (04/16/2022)   Social Connection and Isolation Panel [NHANES]    Frequency of Communication with Friends and Family: More than three times a week    Frequency of Social Gatherings with Friends and Family: More than three times a week    Attends Religious Services: Never    Database administrator or Organizations: No    Attends Banker Meetings: Never    Marital Status: Divorced    Allergies:  Allergies  Allergen Reactions   Lorcet 10-650 [Hydrocodone-Acetaminophen] Itching   Sulfa Antibiotics Other (See Comments)    aching   Tape Other (See Comments)    Tear skin. Please use paper tape   Wellbutrin Xl [Bupropion Hcl Er (Xl)]     Upset stomach /bad taste in mouth    Metabolic Disorder Labs: Lab Results  Component Value Date   HGBA1C 5.9 (H) 07/12/2013   MPG 123 (H) 07/12/2013   No results found for: "PROLACTIN" Lab Results  Component Value Date   CHOL 191 06/17/2022   TRIG 157 (H) 06/17/2022   HDL 51 06/17/2022   CHOLHDL 3.7 06/17/2022   VLDL 36 07/13/2013   LDLCALC 112 (H) 06/17/2022   LDLCALC 86 12/16/2021   Lab Results  Component Value Date   TSH 1.820 05/25/2017   TSH 1.080 05/17/2015    Therapeutic Level Labs: No results  found for: "LITHIUM" No results found for: "VALPROATE" No results found for: "CBMZ"  Current Medications: Current Outpatient Medications  Medication Sig Dispense Refill   cloNIDine (CATAPRES) 0.1 MG tablet Take 1 tablet (0.1 mg total) by mouth 3 (three) times daily. 90 tablet 0   ondansetron (ZOFRAN) 4 MG tablet  Take 1 tablet (4 mg total) by mouth daily as needed for nausea or vomiting. 30 tablet 0   SYMBICORT 160-4.5 MCG/ACT inhaler Inhale 2 puffs into the lungs 2 (two) times daily. 1 each 12   acetaminophen (TYLENOL) 500 MG tablet Take 1,000 mg by mouth every 6 (six) hours as needed for moderate pain.     albuterol (VENTOLIN HFA) 108 (90 Base) MCG/ACT inhaler Inhale 2 puffs into the lungs every 6 (six) hours as needed for wheezing or shortness of breath. 8 g 5   amLODipine (NORVASC) 10 MG tablet TAKE ONE TABLET BY MOUTH DAILY 90 tablet 3   Ascorbic Acid (VITAMIN C) 1000 MG tablet Take 1,000 mg by mouth daily.     atorvastatin (LIPITOR) 40 MG tablet Take 1 tablet (40 mg total) by mouth at bedtime. 90 tablet 3   clonazePAM (KLONOPIN) 1 MG tablet Take 1 tablet (1 mg total) by mouth 3 (three) times daily as needed. for anxiety 90 tablet 2   dexlansoprazole (DEXILANT) 60 MG capsule TAKE ONE CAPSULE BY MOUTH DAILY ON EMPTY STOMACH 90 capsule 2   DULoxetine (CYMBALTA) 60 MG capsule Take 1 capsule (60 mg total) by mouth 2 (two) times daily. 60 capsule 2   fexofenadine (ALLEGRA) 180 MG tablet Take 1 tablet (180 mg total) by mouth daily. 90 tablet 3   gabapentin (NEURONTIN) 600 MG tablet Take 600 mg by mouth 3 (three) times daily.     ipratropium-albuterol (DUONEB) 0.5-2.5 (3) MG/3ML SOLN Take 3 mLs by nebulization every 6 (six) hours as needed. 360 mL 5   lidocaine (LIDODERM) 5 % Place 1 patch onto the skin daily. Remove & Discard patch within 12 hours or as directed by MD 30 patch 0   linaclotide (LINZESS) 145 MCG CAPS capsule Take 1 capsule (145 mcg total) by mouth daily. To regulate bowel movements 30 capsule 5   lisinopril (ZESTRIL) 20 MG tablet Take 1 tablet (20 mg total) by mouth daily. 90 tablet 3   naproxen sodium (ALEVE) 220 MG tablet Take 220 mg by mouth 2 (two) times daily as needed (pain).     rOPINIRole (REQUIP) 3 MG tablet Take 1 tablet (3 mg total) by mouth at bedtime. (NEEDS TO BE SEEN BEFORE NEXT  REFILL) 90 tablet 1   Tiotropium Bromide Monohydrate (SPIRIVA RESPIMAT) 2.5 MCG/ACT AERS INHALE TWO PUFFS INTO THE LUNGS DAILY ( 4 g 5   Varenicline Tartrate, Starter, (CHANTIX STARTING MONTH PAK) 0.5 MG X 11 & 1 MG X 42 TBPK Take 1 0.5 mg tablet once daily for 3 days, increase to 1 0.5 mg tablet twice daily for 4 days, increase to 1 1 mg tablet twice daily. 53 each 0   Vitamin D, Ergocalciferol, (DRISDOL) 1.25 MG (50000 UNIT) CAPS capsule TAKE ONE CAPSULE BY MOUTH ONCE WEEKLY 13 capsule 3   No current facility-administered medications for this visit.     Musculoskeletal: Strength & Muscle Tone: na Gait & Station: na Patient leans: N/A  Psychiatric Specialty Exam: Review of Systems  Gastrointestinal:  Positive for nausea.  Neurological:  Positive for tremors.  Psychiatric/Behavioral:  Positive for sleep disturbance. The patient is nervous/anxious.   All other systems  reviewed and are negative.   There were no vitals taken for this visit.There is no height or weight on file to calculate BMI.  General Appearance: NA  Eye Contact:  NA  Speech:  Clear and Coherent  Volume:  Normal  Mood:  Dysphoric  Affect:  Tearful  Thought Process:  Goal Directed  Orientation:  Full (Time, Place, and Person)  Thought Content: Rumination   Suicidal Thoughts:  No  Homicidal Thoughts:  No  Memory:  Immediate;   Good Recent;   Good Remote;   NA  Judgement:  Good  Insight:  Fair  Psychomotor Activity:  Tremor  Concentration:  Concentration: Fair and Attention Span: Fair  Recall:  [employer] of Knowledge: Fair  Language: Good  Akathisia:  No  Handed:  Right  AIMS (if indicated): not done  Assets:  Communication Skills Desire for Improvement Resilience  ADL's:  Intact  Cognition: WNL  Sleep:  Fair   Screenings: GAD-7    Flowsheet Row Office Visit from 06/17/2022 in [facility] Office Visit from 12/16/2021 in [facility] Visit from 06/17/2021 in [facility] Office Visit from 05/01/2021 in [facility] Office Visit from 04/08/2021 in [facility]  Total GAD-7 Score 9 16 5 7 5       PHQ2-9    Flowsheet Row Office Visit from 06/17/2022 in [facility] Clinical Support from 04/16/2022 in [facility] Office Visit from 12/16/2021 in [facility] Video Visit from 10/21/2021 in [facility] [place] Video Visit from 07/22/2021 in [facility] Total Score 2 4 4 4  0  PHQ-9 Total Score 4 11 10 8  --      Flowsheet Row Video Visit from 10/21/2021 in [facility] [place] Video Visit from 07/22/2021 in [facility] [place] Admission (Discharged) from 05/16/2021 in [place] [facility]IOPER[facility]IVE [facility]  C-SSRS RISK CATEGORY No Risk No Risk No Risk        Assessment and Plan: This patient is a 67 year old female with a history of depression and anxiety.  She seems to be in narcotic withdrawal abruptly since her medications were stopped.  She is does not seem to have a dangerous level of symptoms but she is uncomfortable.  Therefore I will send in clonidine 0.1 mg 3 times daily as well as Zofran 4 mg every 6 hours as needed for nausea.  She will continue Cymbalta 60 mg twice daily for depression and clonazepam 1 mg 3 times daily for anxiety given her current level of distress she will return to see me in 4 weeks  Collaboration of Care: Collaboration of Care: Primary Care Provider AEB notes are shared with PCP on the epic system  Patient/Guardian was advised Release of Information must be obtained prior to any record release in order to collaborate their care with an outside  provider. Patient/Guardian was advised if they have not already done so to contact the registration department to sign all necessary forms in order for [place] to release information regarding their care.   Consent: Patient/Guardian gives verbal consent for treatment and assignment of benefits for services provided during this visit. Patient/Guardian expressed understanding and agreed to proceed.    Diannia Ruder, MD 07/21/2022, 2:00 PM

## 2022-07-24 DIAGNOSIS — M6281 Muscle weakness (generalized): Secondary | ICD-10-CM | POA: Diagnosis not present

## 2022-08-04 DIAGNOSIS — M5126 Other intervertebral disc displacement, lumbar region: Secondary | ICD-10-CM | POA: Diagnosis not present

## 2022-08-04 DIAGNOSIS — M545 Low back pain, unspecified: Secondary | ICD-10-CM | POA: Diagnosis not present

## 2022-08-04 DIAGNOSIS — M47816 Spondylosis without myelopathy or radiculopathy, lumbar region: Secondary | ICD-10-CM | POA: Diagnosis not present

## 2022-08-08 ENCOUNTER — Ambulatory Visit: Payer: Medicare Other | Admitting: Cardiology

## 2022-08-08 NOTE — Progress Notes (Deleted)
Clinical Summary Ms. Everhart is a 67 y.o.female  seen today for follow up of the following medical problems.    1. Atypical chest pain/CAD - long history of atypical chest pain - cath 2008 nonobstrucive CAD - 07/2013 nuclear stress: no ischemia 01/2018 CT for lung cancer screening showed aortic atherosclerosis, with LM disease, LAD, and LCX      CAD risk factors: hyperlipidemia, HTN, +tobacco, both parents MIs in 40 to 50s, brother MI in his 82s   Jan 2020 nuclear stress no ischemia     - chronic nonspecific chest pains, SOB/DOE with walking. +wheezing - compliant with meds   2. COPD - followed by pulmonary       3. HTN - compliant with meds   4. Hyperlipidemia - compliant with atorvastatin   08/2020 TC 172 TG 152 HDL 50 LDL 96   5. Aortic stenosis - 01/2020 echo mild AS mean grad 12 AVA VTI 1.69 - needs repeats in 2024 Past Medical History:  Diagnosis Date   Allergy    Arthritis    Atypical mole 06/05/2003   slight-mod-Left scapula (WS)   Atypical nevus 07/26/2003   persist. dysp- Left scapula (WS)   Basal cell carcinoma 06/05/2003   RIght chest (CX35FU)   Basal cell carcinoma 05/22/2004   superificial- RIght upper back- (CX35FU)   Basal cell carcinoma 11/27/2004   beside R nostril-(MOHS), below right outer nose (MOHS)   Basal cell carcinoma 11/08/2009   left post shoulder -(txpbx)   Basal cell carcinoma 05/24/2012   ulcerated- Left post shoulder- (CX35FU), ulcerated-mid forehead (EXC )   Basal cell carcinoma 06/24/2012   superificial- Right shoulder - (txpbx)    Basal cell carcinoma 07/29/2012    sclerosis- mid forehead (MOHS)   Basal cell carcinoma 06/06/2019   nod-right anterior neck (CX35FU)   Cataract    Closed compression fracture of L5 vertebra (HCC)    Constipation    uses OTC meds with help   COPD (chronic obstructive pulmonary disease) (HCC)    Coronary artery disease    minimal nonobstructive    COVID    Depression    GERD  (gastroesophageal reflux disease)    Heart murmur    Hx of adenomatous polyp of colon 10/03/2014   Hypercholesteremia    Hypertension    Panic attacks    Perimenopausal    Skin cancer    Squamous cell carcinoma of skin 06/05/2003   Right [place](CX35FU)   Squamous cell carcinoma of skin 05/22/2004   in situ- Left upperarm (CX35FU)   Squamous cell carcinoma of skin 11/27/2004   in situ- above Left elbow (CX35FU)   Squamous cell carcinoma of skin 03/04/2006   in situ- Left sideburn (CX35FU)   Squamous cell carcinoma of skin 11/26/2006   in situ- mid brow (Cx35FU)   Squamous cell carcinoma of skin 05/24/2012   in situ- Left chest- (CX35FU)   Squamous cell carcinoma of skin 04/05/2013   in situ- Left nose (Cx35FU)   Squamous cell carcinoma of skin 08/17/2013   well diff-above Left eye (txpbx)   Squamous cell carcinoma of skin 07/30/2016   in situ- right chest sup (Cx35FU)   Squamous cell carcinoma of skin 05/12/2017   in situ- RIght shoulder (CX35FU), in situ- Left forehead (CX35FU)   Squamous cell carcinoma of skin 11/23/2018   in situ- Right mid shin, inner- (MOHS)   Tobacco user      Allergies  Allergen Reactions   Lorcet 10-650 [  Hydrocodone-Acetaminophen] Itching   Sulfa Antibiotics Other (See Comments)    aching   Tape Other (See Comments)    Tear skin. Please use paper tape   Wellbutrin Xl [Bupropion Hcl Er (Xl)]     Upset stomach /bad taste in mouth     Current Outpatient Medications  Medication Sig Dispense Refill   SYMBICORT 160-4.5 MCG/ACT inhaler Inhale 2 puffs into the lungs 2 (two) times daily. 1 each 12   acetaminophen (TYLENOL) 500 MG tablet Take 1,000 mg by mouth every 6 (six) hours as needed for moderate pain.     albuterol (VENTOLIN HFA) 108 (90 Base) MCG/ACT inhaler Inhale 2 puffs into the lungs every 6 (six) hours as needed for wheezing or shortness of breath. 8 g 5   amLODipine (NORVASC) 10 MG tablet TAKE ONE TABLET BY MOUTH DAILY 90 tablet 3    Ascorbic Acid (VITAMIN C) 1000 MG tablet Take 1,000 mg by mouth daily.     atorvastatin (LIPITOR) 40 MG tablet Take 1 tablet (40 mg total) by mouth at bedtime. 90 tablet 3   clonazePAM (KLONOPIN) 1 MG tablet Take 1 tablet (1 mg total) by mouth 3 (three) times daily as needed. for anxiety 90 tablet 2   cloNIDine (CATAPRES) 0.1 MG tablet Take 1 tablet (0.1 mg total) by mouth 3 (three) times daily. 90 tablet 0   dexlansoprazole (DEXILANT) 60 MG capsule TAKE ONE CAPSULE BY MOUTH DAILY ON EMPTY STOMACH 90 capsule 2   DULoxetine (CYMBALTA) 60 MG capsule Take 1 capsule (60 mg total) by mouth 2 (two) times daily. 60 capsule 2   fexofenadine (ALLEGRA) 180 MG tablet Take 1 tablet (180 mg total) by mouth daily. 90 tablet 3   gabapentin (NEURONTIN) 600 MG tablet Take 600 mg by mouth 3 (three) times daily.     ipratropium-albuterol (DUONEB) 0.5-2.5 (3) MG/3ML SOLN Take 3 mLs by nebulization every 6 (six) hours as needed. 360 mL 5   lidocaine (LIDODERM) 5 % Place 1 patch onto the skin daily. Remove & Discard patch within 12 hours or as directed by MD 30 patch 0   linaclotide (LINZESS) 145 MCG CAPS capsule Take 1 capsule (145 mcg total) by mouth daily. To regulate bowel movements 30 capsule 5   lisinopril (ZESTRIL) 20 MG tablet Take 1 tablet (20 mg total) by mouth daily. 90 tablet 3   naproxen sodium (ALEVE) 220 MG tablet Take 220 mg by mouth 2 (two) times daily as needed (pain).     ondansetron (ZOFRAN) 4 MG tablet Take 1 tablet (4 mg total) by mouth daily as needed for nausea or vomiting. 30 tablet 0   rOPINIRole (REQUIP) 3 MG tablet Take 1 tablet (3 mg total) by mouth at bedtime. (NEEDS TO BE SEEN BEFORE NEXT REFILL) 90 tablet 1   Tiotropium Bromide Monohydrate (SPIRIVA RESPIMAT) 2.5 MCG/ACT AERS INHALE TWO PUFFS INTO THE LUNGS DAILY ( 4 g 5   Varenicline Tartrate, Starter, (CHANTIX STARTING MONTH PAK) 0.5 MG X 11 & 1 MG X 42 TBPK Take 1 0.5 mg tablet once daily for 3 days, increase to 1 0.5 mg tablet twice  daily for 4 days, increase to 1 1 mg tablet twice daily. 53 each 0   Vitamin D, Ergocalciferol, (DRISDOL) 1.25 MG (50000 UNIT) CAPS capsule TAKE ONE CAPSULE BY MOUTH ONCE WEEKLY 13 capsule 3   No current facility-administered medications for this visit.     Past Surgical History:  Procedure Laterality Date   ABDOMINAL HYSTERECTOMY  CATARACT EXTRACTION W/PHACO Right 03/01/2021   Procedure: CATARACT EXTRACTION PHACO AND INTRAOCULAR LENS PLACEMENT (IOC);  Surgeon: Fabio Pierce, MD;  Location: [facility];  Service: Ophthalmology;  Laterality: Right;  CDE: 7.31   CATARACT EXTRACTION W/PHACO Left 05/16/2021   Procedure: CATARACT EXTRACTION PHACO AND INTRAOCULAR LENS PLACEMENT (IOC);  Surgeon: Fabio Pierce, MD;  Location: [facility];  Service: Ophthalmology;  Laterality: Left;  CDE 9.41    COLONOSCOPY     IR VERTEBROPLASTY CERV/THOR BX INC UNI/BIL INC/INJECT/IMAGING  04/16/2021   Right foot toe surgery     UPPER GASTROINTESTINAL ENDOSCOPY     vocal cord     polyp removal     Allergies  Allergen Reactions   Lorcet 10-650 [Hydrocodone-Acetaminophen] Itching   Sulfa Antibiotics Other (See Comments)    aching   Tape Other (See Comments)    Tear skin. Please use paper tape   Wellbutrin Xl [Bupropion Hcl Er (Xl)]     Upset stomach /bad taste in mouth      Family History  Problem Relation Age of Onset   Heart disease Mother    Heart attack Mother    Heart attack Father    Colon polyps Sister    Esophageal cancer Sister    Anxiety disorder Sister    Depression Sister    Colon polyps Sister    Heart attack Brother    Colon cancer Brother 69       died at age 10   Anxiety disorder Brother    Depression Brother    Alcohol abuse Brother    Colon polyps Brother    Colon polyps Brother    Drug abuse Son    Breast cancer Niece    Rectal cancer Neg Hx    Stomach cancer Neg Hx      Social History Ms. Imler reports that she has been smoking cigarettes. She started smoking about 53  years ago. She has a 40.00 pack-year smoking history. She has never used smokeless tobacco. Ms. Chopra reports no history of alcohol use.   Review of Systems CONSTITUTIONAL: No weight loss, fever, chills, weakness or fatigue.  HEENT: Eyes: No visual loss, blurred vision, double vision or yellow sclerae.No hearing loss, sneezing, congestion, runny nose or sore throat.  SKIN: No rash or itching.  CARDIOVASCULAR:  RESPIRATORY: No shortness of breath, cough or sputum.  GASTROINTESTINAL: No anorexia, nausea, vomiting or diarrhea. No abdominal pain or blood.  GENITOURINARY: No burning on urination, no polyuria NEUROLOGICAL: No headache, dizziness, syncope, paralysis, ataxia, numbness or tingling in the extremities. No change in bowel or bladder control.  MUSCULOSKELETAL: No muscle, back pain, joint pain or stiffness.  LYMPHATICS: No enlarged nodes. No history of splenectomy.  PSYCHIATRIC: No history of depression or anxiety.  ENDOCRINOLOGIC: No reports of sweating, cold or heat intolerance. No polyuria or polydipsia.  Marland Kitchen   Physical Examination There were no vitals filed for this visit. There were no vitals filed for this visit.  Gen: resting comfortably, no acute distress HEENT: no scleral icterus, pupils equal round and reactive, no palptable cervical adenopathy,  CV Resp: Clear to auscultation bilaterally GI: abdomen is soft, non-tender, non-distended, normal bowel sounds, no hepatosplenomegaly MSK: extremities are warm, no edema.  Skin: warm, no rash Neuro:  no focal deficits Psych: appropriate affect   Diagnostic Studies  06/2006 cath FINDINGS:  Aortic pressure 125/78 with a mean of 99. Left ventricular  pressure 125/5 with an end-diastolic pressure of 10.    The left mainstem is  angiographically normal.  It bifurcates into the  LAD and left circumflex.    The LAD is a large-caliber vessel that courses down to the LV apex.  It  gives off a first diagonal Kaula Klenke and is a large  vessel.  There is no  significant disease in the LAD or diagonal system.    Left circumflex is a large-caliber vessel.  There is a small  intermediate Ketzaly Cardella present.  There are really no substantial obtuse  marginal branches from the left circumflex present.  The left circumflex  courses down the AV groove and gives off a large posterolateral Emelio Schneller.  The mid portion of the left circumflex has a long segment of 30-40%  stenosis.    The right coronary artery is a large-caliber vessel.  It terminates  distally in a PDA and posterior AV segment which gives off two  posterolateral branches.  The mid portion of the right coronary artery  provides a small RV marginal Diane Mochizuki, and the proximal portion has a  small conus Stephania Macfarlane present.  There are minor luminal irregularities in  the proximal portion of the right coronary artery, but there is no  significant angiographic disease.    Left ventriculography performed in the 30-degree RAO projection  demonstrates normal left ventricular systolic function with an LVEF of  55%.  There is no mitral regurgitation.    ASSESSMENT:  1. Nonobstructive coronary artery disease, predominantly involving the      left circumflex system.  2. Normal left ventricular function.  3. Intracoronary vascular ultrasound demonstration of minimal plaque      in the right coronary artery.    PLAN:  Ms. Osby will require aggressive medical therapy.  She needs  tobacco cessation and progressive statin therapy.  She will receive  her statin therapy through the [facility] protocol.  She should be on a  daily aspirin as well.  She will be a candidate for early discharge as  her chest pain appears to have been noncardiac, and she has had no  further pain.     Jan 2020 nuclear stress There was no ST segment deviation noted during stress. Defect 1: There is a medium defect of mild severity present in the basal inferior, mid inferior and apical inferior location. This is  likely due to soft tissue attenuation. No evidence of ischemia. This is a low risk study. Nuclear stress EF: 58%.   01/2020 echo IMPRESSIONS     1. Left ventricular ejection fraction, by estimation, is 60 to 65%. The  left ventricle has normal function. The left ventricle has no regional  wall motion abnormalities. Left ventricular diastolic parameters are  consistent with Grade I diastolic  dysfunction (impaired relaxation).   2. Right ventricular systolic function is normal. The right ventricular  size is normal.   3. The mitral valve is normal in structure. No evidence of mitral valve  regurgitation. No evidence of mitral stenosis.   4. The aortic valve has an indeterminant number of cusps. There is mild  calcification of the aortic valve. There is mild thickening of the aortic  valve. Aortic valve regurgitation is mild. Mild aortic valve stenosis.  Mild aortic stenosis is present.  Aortic valve mean gradient measures 12.0 mmHg. Aortic valve peak gradient  measures 23.1 mmHg. Aortic valve area, by VTI measures 1.69 cm.   5. The inferior vena cava is normal in size with greater than 50%  respiratory variability, suggesting right atrial pressure of 3 mmHg.  Assessment and Plan  1. CAD/Atypical chest pain - long history of atypical chest pain - nonobstructive CAD by cath 2008, negative nuclear stress 2015 and more recently Jan 2020 -chronic symptoms unchanged, continue to monitor   2.Aortic stenosis - mild by recent echo - will repeat study in 2024     3. HTN - manual recheck 134/75. At other provider visits has essentially been right at goal - continue current meds   4. Hyperlipidemia - at goal, continue statin      Antoine Poche, M.D.

## 2022-08-15 DIAGNOSIS — M7912 Myalgia of auxiliary muscles, head and neck: Secondary | ICD-10-CM | POA: Diagnosis not present

## 2022-08-15 DIAGNOSIS — M7918 Myalgia, other site: Secondary | ICD-10-CM | POA: Diagnosis not present

## 2022-08-15 DIAGNOSIS — Z79899 Other long term (current) drug therapy: Secondary | ICD-10-CM | POA: Diagnosis not present

## 2022-08-20 ENCOUNTER — Encounter: Payer: Self-pay | Admitting: Family Medicine

## 2022-08-20 ENCOUNTER — Ambulatory Visit (INDEPENDENT_AMBULATORY_CARE_PROVIDER_SITE_OTHER): Payer: 59 | Admitting: Family Medicine

## 2022-08-20 VITALS — BP 139/74 | HR 87 | Temp 97.9°F | Ht 65.0 in | Wt 144.8 lb

## 2022-08-20 DIAGNOSIS — M1990 Unspecified osteoarthritis, unspecified site: Secondary | ICD-10-CM | POA: Diagnosis not present

## 2022-08-20 DIAGNOSIS — S46012S Strain of muscle(s) and tendon(s) of the rotator cuff of left shoulder, sequela: Secondary | ICD-10-CM

## 2022-08-20 DIAGNOSIS — M5126 Other intervertebral disc displacement, lumbar region: Secondary | ICD-10-CM

## 2022-08-20 DIAGNOSIS — F332 Major depressive disorder, recurrent severe without psychotic features: Secondary | ICD-10-CM | POA: Diagnosis not present

## 2022-08-20 DIAGNOSIS — F1120 Opioid dependence, uncomplicated: Secondary | ICD-10-CM | POA: Diagnosis not present

## 2022-08-20 NOTE — Progress Notes (Signed)
Subjective:  Patient ID: [name], female    DOB: [birth year]  Age: 67 y.o. MRN: [id number]  CC: FACE TO FACE and Referral [facility])   HPI [name] presents for need for home services help cook, clean and bathe. Can't do these due to severe back pain and left arm pain. (Left rotator cuff pain.) Also daily HA. Tried ESI, pain meds and chiropractor. Pain recentl 7/10. Pt. Also has widespread arthritis that causes painand immobility. She has a lumbar herniated disc that causes severe pain with activity.  She also is immobilized mentally and psychologically due to long term severe depression.   Off pain meds du to fentanyl found in specimen in April. It was related to her cleaning up her son's house - he had been arrested for drug dealing. She wiped counters and picked up a bunch of gloves left lying around. Unfortunately, tere must have been traces of fentanyl left behind which she inadvertently contactd. Willing to do a drug screen today to show that she is free of opiates currently.       08/20/2022   11:29 AM 08/20/2022   11:21 AM 06/17/2022    1:15 PM  Depression screen PHQ 2/9  Decreased Interest 2 0 1  Down, Depressed, Hopeless 2 0 1  PHQ - 2 Score 4 0 2  Altered sleeping 1  1  Tired, decreased energy 2  1  Change in appetite 2  0  Feeling bad or failure about yourself  1  0  Trouble concentrating 2  0  Moving slowly or fidgety/restless 2  0  Suicidal thoughts 0  0  PHQ-9 Score 14  4  Difficult doing work/chores Very difficult  Very difficult    History [name] has a past medical history of Allergy, Arthritis, Atypical mole (06/05/2003), Atypical nevus (07/26/2003), Basal cell carcinoma (06/05/2003), Basal cell carcinoma (05/22/2004), Basal cell carcinoma (11/27/2004), Basal cell carcinoma (11/08/2009), Basal cell carcinoma (05/24/2012), Basal cell carcinoma (06/24/2012), Basal cell carcinoma (07/29/2012), Basal cell carcinoma (06/06/2019), Cataract, Closed  compression fracture of L5 vertebra (HCC), Constipation, COPD (chronic obstructive pulmonary disease) (HCC), Coronary artery disease, COVID, Depression, GERD (gastroesophageal reflux disease), Heart murmur, adenomatous polyp of colon (10/03/2014), Hypercholesteremia, Hypertension, Panic attacks, Perimenopausal, Skin cancer, Squamous cell carcinoma of skin (06/05/2003), Squamous cell carcinoma of skin (05/22/2004), Squamous cell carcinoma of skin (11/27/2004), Squamous cell carcinoma of skin (03/04/2006), Squamous cell carcinoma of skin (11/26/2006), Squamous cell carcinoma of skin (05/24/2012), Squamous cell carcinoma of skin (04/05/2013), Squamous cell carcinoma of skin (08/17/2013), Squamous cell carcinoma of skin (07/30/2016), Squamous cell carcinoma of skin (05/12/2017), Squamous cell carcinoma of skin (11/23/2018), and Tobacco user.   She has a past surgical history that includes Abdominal hysterectomy; vocal cord; Right foot toe surgery; Colonoscopy; Upper gastrointestinal endoscopy; Cataract extraction w/PHACO (Right, 03/01/2021); IR VERTEBROPLASTY CERV/THOR BX INC UNI/BIL INC/INJECT/IMAGING (04/16/2021); and Cataract extraction w/PHACO (Left, 05/16/2021).   Her family history includes Alcohol abuse in her brother; Anxiety disorder in her brother and sister; Breast cancer in her niece; Colon cancer (age of onset: 78) in her brother; Colon polyps in her brother, brother, sister, and sister; Depression in her brother and sister; Drug abuse in her son; Esophageal cancer in her sister; Heart attack in her brother, father, and mother; Heart disease in her mother.She reports that she has been smoking cigarettes. She started smoking about 53 years ago. She has a 40.00 pack-year smoking history. She has never used smokeless tobacco. She reports that she does  not drink alcohol and does not use drugs.    ROS Review of Systems  Constitutional:  Positive for activity change (unable to bend, lift. Cannot abduct or  rotate LUE due to pain.).  HENT: Negative.    Eyes:  Negative for visual disturbance.  Respiratory:  Negative for shortness of breath.   Cardiovascular:  Negative for chest pain.  Gastrointestinal:  Negative for abdominal pain.  Musculoskeletal:  Positive for arthralgias, back pain, myalgias and neck pain.  Psychiatric/Behavioral:  Positive for agitation, decreased concentration, dysphoric mood and sleep disturbance. The patient is nervous/anxious.     Objective:  BP 139/74   Pulse 87   Temp 97.9 F (36.6 C)   Ht 5\' 5"  (1.651 m)   Wt 144 lb 12.8 oz (65.7 kg)   SpO2 95%   BMI 24.10 kg/m   BP Readings from Last 3 Encounters:  08/20/22 139/74  06/17/22 136/79  04/25/22 118/78    Wt Readings from Last 3 Encounters:  08/20/22 144 lb 12.8 oz (65.7 kg)  06/17/22 143 lb 3.2 oz (65 kg)  04/25/22 146 lb 3.2 oz (66.3 kg)     Physical Exam Constitutional:      General: She is in acute distress.     Appearance: She is well-developed. She is ill-appearing.  Cardiovascular:     Rate and Rhythm: Normal rate and regular rhythm.  Pulmonary:     Breath sounds: Normal breath sounds.  Abdominal:     Palpations: Abdomen is soft.  Musculoskeletal:        General: Tenderness (over lumbar spine for percussion, for passive rom LUE at shoulder) present. No swelling or deformity. Normal range of motion.     Comments: Stooped, antalgic gait  Skin:    General: Skin is warm and dry.  Neurological:     Mental Status: She is alert and oriented to person, place, and time.     Cranial Nerves: No cranial nerve deficit.  Psychiatric:        Thought Content: Thought content normal.     Comments: Tearful, distraught       Assessment & Plan:   [name] was seen today for face to face and referral.  Diagnoses and all orders for this visit:  Lumbar herniated disc -     ToxASSURE Select 13 (MW), Urine  Osteoarthritis, unspecified osteoarthritis type, unspecified site  Uncomplicated opioid  dependence (HCC) -     ToxASSURE Select 13 (MW), Urine  Severe episode of recurrent major depressive disorder, without psychotic features (HCC)  Traumatic complete tear of left rotator cuff, sequela       I am having [name] maintain her vitamin C, ipratropium-albuterol, albuterol, lidocaine, gabapentin, acetaminophen, naproxen sodium, Vitamin D (Ergocalciferol), amLODipine, Varenicline Tartrate (Starter), atorvastatin, lisinopril, dexlansoprazole, fexofenadine, rOPINIRole, Spiriva Respimat, linaclotide, Symbicort, clonazePAM, DULoxetine, ondansetron, and cloNIDine.  Allergies as of 08/20/2022       Reactions   Lorcet 10-650 [hydrocodone-acetaminophen] Itching   Sulfa Antibiotics Other (See Comments)   aching   Tape Other (See Comments)   Tear skin. Please use paper tape   Wellbutrin Xl [bupropion Hcl Er (xl)]    Upset stomach /bad taste in mouth        Medication List        Accurate as of August 20, 2022 12:09 PM. If you have any questions, ask your nurse or doctor.          acetaminophen 500 MG tablet Commonly known as: TYLENOL Take 1,000 mg  by mouth every 6 (six) hours as needed for moderate pain.   albuterol 108 (90 Base) MCG/ACT inhaler Commonly known as: VENTOLIN HFA Inhale 2 puffs into the lungs every 6 (six) hours as needed for wheezing or shortness of breath.   amLODipine 10 MG tablet Commonly known as: NORVASC TAKE ONE TABLET BY MOUTH DAILY   atorvastatin 40 MG tablet Commonly known as: LIPITOR Take 1 tablet (40 mg total) by mouth at bedtime.   clonazePAM 1 MG tablet Commonly known as: KLONOPIN Take 1 tablet (1 mg total) by mouth 3 (three) times daily as needed. for anxiety   cloNIDine 0.1 MG tablet Commonly known as: CATAPRES Take 1 tablet (0.1 mg total) by mouth 3 (three) times daily.   dexlansoprazole 60 MG capsule Commonly known as: DEXILANT TAKE ONE CAPSULE BY MOUTH DAILY ON EMPTY STOMACH   DULoxetine 60 MG capsule Commonly known  as: CYMBALTA Take 1 capsule (60 mg total) by mouth 2 (two) times daily.   fexofenadine 180 MG tablet Commonly known as: ALLEGRA Take 1 tablet (180 mg total) by mouth daily.   gabapentin 600 MG tablet Commonly known as: NEURONTIN Take 600 mg by mouth 3 (three) times daily.   ipratropium-albuterol 0.5-2.5 (3) MG/3ML Soln Commonly known as: DUONEB Take 3 mLs by nebulization every 6 (six) hours as needed.   lidocaine 5 % Commonly known as: Lidoderm Place 1 patch onto the skin daily. Remove & Discard patch within 12 hours or as directed by MD   linaclotide 145 MCG Caps capsule Commonly known as: Linzess Take 1 capsule (145 mcg total) by mouth daily. To regulate bowel movements   lisinopril 20 MG tablet Commonly known as: ZESTRIL Take 1 tablet (20 mg total) by mouth daily.   naproxen sodium 220 MG tablet Commonly known as: ALEVE Take 220 mg by mouth 2 (two) times daily as needed (pain).   ondansetron 4 MG tablet Commonly known as: Zofran Take 1 tablet (4 mg total) by mouth daily as needed for nausea or vomiting.   rOPINIRole 3 MG tablet Commonly known as: REQUIP Take 1 tablet (3 mg total) by mouth at bedtime. (NEEDS TO BE SEEN BEFORE NEXT REFILL)   Spiriva Respimat 2.5 MCG/ACT Aers Generic drug: Tiotropium Bromide Monohydrate INHALE TWO PUFFS INTO THE LUNGS DAILY (   Symbicort 160-4.5 MCG/ACT inhaler Generic drug: budesonide-formoterol Inhale 2 puffs into the lungs 2 (two) times daily.   Varenicline Tartrate (Starter) 0.5 MG X 11 & 1 MG X 42 Tbpk Commonly known as: Chantix Starting Month Pak Take 1 0.5 mg tablet once daily for 3 days, increase to 1 0.5 mg tablet twice daily for 4 days, increase to 1 1 mg tablet twice daily.   vitamin C 1000 MG tablet Take 1,000 mg by mouth daily.   Vitamin D (Ergocalciferol) 1.25 MG (50000 UNIT) Caps capsule Commonly known as: DRISDOL TAKE ONE CAPSULE BY MOUTH ONCE WEEKLY         Follow-up: No follow-ups on file.  Mechele Claude, M.D.

## 2022-08-23 DIAGNOSIS — M6281 Muscle weakness (generalized): Secondary | ICD-10-CM | POA: Diagnosis not present

## 2022-08-25 ENCOUNTER — Encounter (HOSPITAL_COMMUNITY): Payer: Self-pay | Admitting: Psychiatry

## 2022-08-25 ENCOUNTER — Telehealth (INDEPENDENT_AMBULATORY_CARE_PROVIDER_SITE_OTHER): Payer: 59 | Admitting: Psychiatry

## 2022-08-25 DIAGNOSIS — F332 Major depressive disorder, recurrent severe without psychotic features: Secondary | ICD-10-CM | POA: Diagnosis not present

## 2022-08-25 DIAGNOSIS — F411 Generalized anxiety disorder: Secondary | ICD-10-CM | POA: Diagnosis not present

## 2022-08-25 MED ORDER — DULOXETINE HCL 60 MG PO CPEP: 60.0000 mg | ORAL_CAPSULE | Freq: Two times a day (BID) | ORAL | 2 refills | Status: DC

## 2022-08-25 MED ORDER — CLONAZEPAM 1 MG PO TABS
1.0000 mg | ORAL_TABLET | Freq: Three times a day (TID) | ORAL | 2 refills | Status: DC | PRN
Start: 2022-08-25 — End: 2022-11-25

## 2022-08-25 MED ORDER — CLONIDINE HCL 0.1 MG PO TABS: 0.1000 mg | ORAL_TABLET | Freq: Three times a day (TID) | ORAL | 2 refills | Status: DC

## 2022-08-25 NOTE — Progress Notes (Signed)
Virtual Visit via Telephone Note  I connected with [name] on 08/25/22 at 11:00 AM EDT by telephone and verified that I am speaking with the correct person using two identifiers.  Location: Patient: home Provider: office   I discussed the limitations, risks, security and privacy concerns of performing an evaluation and management service by telephone and the availability of in person appointments. I also discussed with the patient that there may be a patient responsible charge related to this service. The patient expressed understanding and agreed to proceed.      I discussed the assessment and treatment plan with the patient. The patient was provided an opportunity to ask questions and all were answered. The patient agreed with the plan and demonstrated an understanding of the instructions.   The patient was advised to call back or seek an in-person evaluation if the symptoms worsen or if the condition fails to improve as anticipated.  I provided 15 minutes of non-face-to-face time during this encounter.   Diannia Ruder, MD  [facility] MD/PA/NP OP Progress Note  08/25/2022 11:28 AM [name]  MRN:  [id number]  Chief Complaint:  Chief Complaint  Patient presents with   Anxiety   Depression   Follow-up   HPI: This patient is a 67 year old female who is widowed and lives alone in [place].  She is on disability.  The patient returns for follow-up after 4 weeks regarding her anxiety and depression.  Last time she was going through narcotic withdrawal after her pain management physician refused to refill her medication.  This was after she cleaned up her son's place and was exposed to fentanyl.  She now is awaiting a referral to another pain management program.  She is not having the withdrawal symptoms anymore and the clonidine has helped and she would like to continue it because it helps her anxiety..  She is no longer using the Zofran for nausea.  Right now her mood has been fairly  stable and she denies significant depression or anxiety.  She is sleeping fairly well.  She denies suicidal ideation Visit Diagnosis:    ICD-10-CM   1. GAD (generalized anxiety disorder)  F41.1 clonazePAM (KLONOPIN) 1 MG tablet    2. Severe episode of recurrent major depressive disorder, without psychotic features (HCC)  F33.2 DULoxetine (CYMBALTA) 60 MG capsule      Past Psychiatric History: none  Past Medical History:  Past Medical History:  Diagnosis Date   Allergy    Arthritis    Atypical mole 06/05/2003   slight-mod-Left scapula (WS)   Atypical nevus 07/26/2003   persist. dysp- Left scapula (WS)   Basal cell carcinoma 06/05/2003   RIght chest (CX35FU)   Basal cell carcinoma 05/22/2004   superificial- RIght upper back- (CX35FU)   Basal cell carcinoma 11/27/2004   beside R nostril-(MOHS), below right outer nose (MOHS)   Basal cell carcinoma 11/08/2009   left post shoulder -(txpbx)   Basal cell carcinoma 05/24/2012   ulcerated- Left post shoulder- (CX35FU), ulcerated-mid forehead (EXC )   Basal cell carcinoma 06/24/2012   superificial- Right shoulder - (txpbx)    Basal cell carcinoma 07/29/2012    sclerosis- mid forehead (MOHS)   Basal cell carcinoma 06/06/2019   nod-right anterior neck (CX35FU)   Cataract    Closed compression fracture of L5 vertebra (HCC)    Constipation    uses OTC meds with help   COPD (chronic obstructive pulmonary disease) (HCC)    Coronary artery disease  minimal nonobstructive    COVID    Depression    GERD (gastroesophageal reflux disease)    Heart murmur    Hx of adenomatous polyp of colon 10/03/2014   Hypercholesteremia    Hypertension    Panic attacks    Perimenopausal    Skin cancer    Squamous cell carcinoma of skin 06/05/2003   Right Temple(CX35FU)   Squamous cell carcinoma of skin 05/22/2004   in situ- Left upperarm (CX35FU)   Squamous cell carcinoma of skin 11/27/2004   in situ- above Left elbow (CX35FU)   Squamous cell  carcinoma of skin 03/04/2006   in situ- Left sideburn (CX35FU)   Squamous cell carcinoma of skin 11/26/2006   in situ- mid brow (Cx35FU)   Squamous cell carcinoma of skin 05/24/2012   in situ- Left chest- (CX35FU)   Squamous cell carcinoma of skin 04/05/2013   in situ- Left nose (Cx35FU)   Squamous cell carcinoma of skin 08/17/2013   well diff-above Left eye (txpbx)   Squamous cell carcinoma of skin 07/30/2016   in situ- right chest sup (Cx35FU)   Squamous cell carcinoma of skin 05/12/2017   in situ- RIght shoulder (CX35FU), in situ- Left forehead (CX35FU)   Squamous cell carcinoma of skin 11/23/2018   in situ- Right mid shin, inner- (MOHS)   Tobacco user     Past Surgical History:  Procedure Laterality Date   ABDOMINAL HYSTERECTOMY     CATARACT EXTRACTION W/PHACO Right 03/01/2021   Procedure: CATARACT EXTRACTION PHACO AND INTRAOCULAR LENS PLACEMENT (IOC);  Surgeon: Fabio Pierce, MD;  Location: [facility];  Service: Ophthalmology;  Laterality: Right;  CDE: 7.31   CATARACT EXTRACTION W/PHACO Left 05/16/2021   Procedure: CATARACT EXTRACTION PHACO AND INTRAOCULAR LENS PLACEMENT (IOC);  Surgeon: Fabio Pierce, MD;  Location: [facility];  Service: Ophthalmology;  Laterality: Left;  CDE 9.41    COLONOSCOPY     IR VERTEBROPLASTY CERV/THOR BX INC UNI/BIL INC/INJECT/IMAGING  04/16/2021   Right foot toe surgery     UPPER GASTROINTESTINAL ENDOSCOPY     vocal cord     polyp removal    Family Psychiatric History: See below  Family History:  Family History  Problem Relation Age of Onset   Heart disease Mother    Heart attack Mother    Heart attack Father    Colon polyps Sister    Esophageal cancer Sister    Anxiety disorder Sister    Depression Sister    Colon polyps Sister    Heart attack Brother    Colon cancer Brother 58       died at age 45   Anxiety disorder Brother    Depression Brother    Alcohol abuse Brother    Colon polyps Brother    Colon polyps Brother    Drug abuse Son     Breast cancer Niece    Rectal cancer Neg Hx    Stomach cancer Neg Hx     Social History:  Social History   Socioeconomic History   Marital status: Single    Spouse name: Not on file   Number of children: 2   Years of education: Not on file   Highest education level: Not on file  Occupational History   Occupation: Conservation officer, 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   Sexual activity: Not on file  Other Topics Concern   Not on file  Social History Narrative   Retired/ lives alone in one level home   Son and daughter in law that live next door and help her out a lot         Social Determinants of Health   Financial Resource Strain: Medium Risk (04/16/2022)   Overall Financial Resource Strain (CARDIA)    Difficulty of Paying Living Expenses: Somewhat hard  Food Insecurity: No Food Insecurity (04/16/2022)   Hunger Vital Sign    Worried About Running Out of Food in the Last Year: Never true    Ran Out of Food in the Last Year: Never true  Transportation Needs: No Transportation Needs (04/16/2022)   PRAPARE - Administrator, Civil Service (Medical): No    Lack of Transportation (Non-Medical): No  Physical Activity: Inactive (04/16/2022)   Exercise Vital Sign    Days of Exercise per Week: 0 days    Minutes of Exercise per Session: 0 min  Stress: Stress Concern Present (04/16/2022)   [employer] of Occupational Health - Occupational Stress Questionnaire    Feeling of Stress : To some extent   Social Connections: Socially Isolated (04/16/2022)   Social Connection and Isolation Panel [NHANES]    Frequency of Communication with Friends and Family: More than three times a week    Frequency of Social Gatherings with Friends and Family: More than three times a week    Attends Religious Services: Never    Database administrator or Organizations: No    Attends Banker Meetings: Never    Marital Status: Divorced    Allergies:  Allergies  Allergen Reactions   Lorcet 10-650 [Hydrocodone-Acetaminophen] Itching   Sulfa Antibiotics Other (See Comments)    aching   Tape Other (See Comments)    Tear skin. Please use paper tape   Wellbutrin Xl [Bupropion Hcl Er (Xl)]     Upset stomach /bad taste in mouth    Metabolic Disorder Labs: Lab Results  Component Value Date   HGBA1C 5.9 (H) 07/12/2013   MPG 123 (H) 07/12/2013   No results found for: "PROLACTIN" Lab Results  Component Value Date   CHOL 191 06/17/2022   TRIG 157 (H) 06/17/2022   HDL 51 06/17/2022   CHOLHDL 3.7 06/17/2022   VLDL 36 07/13/2013   LDLCALC 112 (H) 06/17/2022   LDLCALC 86 12/16/2021   Lab Results  Component Value Date   TSH 1.820 05/25/2017   TSH 1.080 05/17/2015    Therapeutic Level Labs: No results found for: "LITHIUM" No results found for: "VALPROATE" No results found for: "CBMZ"  Current Medications: Current Outpatient Medications  Medication Sig Dispense Refill   acetaminophen (TYLENOL) 500 MG tablet Take 1,000 mg by mouth every 6 (six) hours as needed for moderate pain.     albuterol (VENTOLIN HFA) 108 (90 Base) MCG/ACT inhaler Inhale 2 puffs into the lungs every 6 (six) hours as needed for wheezing or shortness of breath. 8 g 5  amLODipine (NORVASC) 10 MG tablet TAKE ONE TABLET BY MOUTH DAILY 90 tablet 3   Ascorbic Acid (VITAMIN C) 1000 MG tablet Take 1,000 mg by mouth daily.     atorvastatin (LIPITOR) 40 MG tablet Take 1 tablet (40 mg total) by mouth at bedtime. 90 tablet 3    clonazePAM (KLONOPIN) 1 MG tablet Take 1 tablet (1 mg total) by mouth 3 (three) times daily as needed. for anxiety 90 tablet 2   cloNIDine (CATAPRES) 0.1 MG tablet Take 1 tablet (0.1 mg total) by mouth 3 (three) times daily. 90 tablet 2   dexlansoprazole (DEXILANT) 60 MG capsule TAKE ONE CAPSULE BY MOUTH DAILY ON EMPTY STOMACH 90 capsule 2   DULoxetine (CYMBALTA) 60 MG capsule Take 1 capsule (60 mg total) by mouth 2 (two) times daily. 60 capsule 2   fexofenadine (ALLEGRA) 180 MG tablet Take 1 tablet (180 mg total) by mouth daily. 90 tablet 3   gabapentin (NEURONTIN) 600 MG tablet Take 600 mg by mouth 3 (three) times daily.     ipratropium-albuterol (DUONEB) 0.5-2.5 (3) MG/3ML SOLN Take 3 mLs by nebulization every 6 (six) hours as needed. 360 mL 5   lidocaine (LIDODERM) 5 % Place 1 patch onto the skin daily. Remove & Discard patch within 12 hours or as directed by MD 30 patch 0   linaclotide (LINZESS) 145 MCG CAPS capsule Take 1 capsule (145 mcg total) by mouth daily. To regulate bowel movements 30 capsule 5   lisinopril (ZESTRIL) 20 MG tablet Take 1 tablet (20 mg total) by mouth daily. 90 tablet 3   naproxen sodium (ALEVE) 220 MG tablet Take 220 mg by mouth 2 (two) times daily as needed (pain).     ondansetron (ZOFRAN) 4 MG tablet Take 1 tablet (4 mg total) by mouth daily as needed for nausea or vomiting. 30 tablet 0   rOPINIRole (REQUIP) 3 MG tablet Take 1 tablet (3 mg total) by mouth at bedtime. (NEEDS TO BE SEEN BEFORE NEXT REFILL) 90 tablet 1   SYMBICORT 160-4.5 MCG/ACT inhaler Inhale 2 puffs into the lungs 2 (two) times daily. 1 each 12   Tiotropium Bromide Monohydrate (SPIRIVA RESPIMAT) 2.5 MCG/ACT AERS INHALE TWO PUFFS INTO THE LUNGS DAILY ( 4 g 5   Varenicline Tartrate, Starter, (CHANTIX STARTING MONTH PAK) 0.5 MG X 11 & 1 MG X 42 TBPK Take 1 0.5 mg tablet once daily for 3 days, increase to 1 0.5 mg tablet twice daily for 4 days, increase to 1 1 mg tablet twice daily. 53 each 0   Vitamin  D, Ergocalciferol, (DRISDOL) 1.25 MG (50000 UNIT) CAPS capsule TAKE ONE CAPSULE BY MOUTH ONCE WEEKLY 13 capsule 3   No current facility-administered medications for this visit.     Musculoskeletal: Strength & Muscle Tone: na Gait & Station: na Patient leans: N/A  Psychiatric Specialty Exam: Review of Systems  Musculoskeletal:  Positive for arthralgias and back pain.  All other systems reviewed and are negative.   There were no vitals taken for this visit.There is no height or weight on file to calculate BMI.  General Appearance: NA  Eye Contact:  NA  Speech:  Clear and Coherent  Volume:  Normal  Mood:  Euthymic  Affect:  NA  Thought Process:  Goal Directed  Orientation:  Full (Time, Place, and Person)  Thought Content: Rumination   Suicidal Thoughts:  No  Homicidal Thoughts:  No  Memory:  Immediate;   Good Recent;   Good Remote;   NA  Judgement:  Good  Insight:  Fair  Psychomotor Activity:  Decreased  Concentration:  Concentration: Fair and Attention Span: Fair  Recall:  [employer] of Knowledge: Fair  Language: Good  Akathisia:  No  Handed:  Right  AIMS (if indicated): not done  Assets:  Communication Skills Desire for Improvement Resilience  ADL's:  Intact  Cognition: WNL  Sleep:  Fair   Screenings: GAD-7    Flowsheet Row Office Visit from 08/20/2022 in [facility] Office Visit from 06/17/2022 in [facility] Office Visit from 12/16/2021 in [facility] Office Visit from 06/17/2021 in [facility] Office Visit from 05/01/2021 in [facility]  Total GAD-7 Score 14 9 16 5 7       PHQ2-9    Flowsheet Row Office Visit from 08/20/2022 in [facility] Office Visit from 06/17/2022 in [facility] Clinical Support from 04/16/2022 in [facility] Office Visit from 12/16/2021 in [facility] Video Visit from 10/21/2021 in [facility] Total Score 4 2 4 4 4   PHQ-9 Total Score 14 4 11 10 8       Flowsheet Row Video Visit from 10/21/2021 in [facility] [place] Video Visit from 07/22/2021 in [facility] [place] Admission (Discharged) from 05/16/2021 in [facility]  C-SSRS RISK CATEGORY No Risk No Risk No Risk        Assessment and Plan: This patient is a 67 year old female with a history of depression and anxiety.  She is no longer experiencing narcotic withdrawal but would like to continue the clonidine 0.1 mg 3 times daily as it helps the anxiety along with clonazepam 1 mg 3 times daily.  She will also continue Cymbalta 60 mg twice daily for depression.  She will return to see me in 3 months  Collaboration of Care: Collaboration of Care: Primary Care Provider AEB notes are shared with PCP through the epic system  Patient/Guardian was advised Release of Information must be obtained prior to any record release in order to collaborate their care with an outside provider. Patient/Guardian was advised if they have not already done so to contact the registration department to sign all necessary forms in order for [place] to release information regarding their care.   Consent: Patient/Guardian gives verbal consent for treatment and assignment of benefits for services provided during this visit. Patient/Guardian expressed understanding and agreed to proceed.    Diannia Ruder, MD 08/25/2022, 11:28 AM

## 2022-08-26 LAB — TOXASSURE SELECT 13 (MW), URINE

## 2022-08-26 NOTE — Progress Notes (Signed)
Pt. Was notified of Tox screen with fentanyl and metabolite of clonazepam. I recommended rehab. She declined. She can wait 2 more weeks and repeat the tox screen. WS

## 2022-09-09 ENCOUNTER — Other Ambulatory Visit: Payer: Self-pay | Admitting: Family Medicine

## 2022-09-17 ENCOUNTER — Other Ambulatory Visit: Payer: Self-pay | Admitting: Family Medicine

## 2022-09-17 ENCOUNTER — Telehealth: Payer: Self-pay | Admitting: Family Medicine

## 2022-09-17 DIAGNOSIS — F1120 Opioid dependence, uncomplicated: Secondary | ICD-10-CM

## 2022-09-17 NOTE — Telephone Encounter (Signed)
 Patient aware and verbalizes understanding. 

## 2022-09-17 NOTE — Telephone Encounter (Signed)
I ordered the toxassure. Have her come in for it.

## 2022-09-17 NOTE — Telephone Encounter (Signed)
Patient said she was told she needed to come back in to leave a urine by PCP for her drug screening, no orders in chart at this time. Wanted to make sure she only needed urine/ Did not make an appt for labs because she was not sure if she needed. Please call back and advise.

## 2022-09-22 ENCOUNTER — Other Ambulatory Visit: Payer: 59

## 2022-09-22 DIAGNOSIS — M6281 Muscle weakness (generalized): Secondary | ICD-10-CM | POA: Diagnosis not present

## 2022-09-25 DIAGNOSIS — M6281 Muscle weakness (generalized): Secondary | ICD-10-CM | POA: Diagnosis not present

## 2022-10-03 ENCOUNTER — Ambulatory Visit (HOSPITAL_COMMUNITY): Payer: 59

## 2022-10-03 ENCOUNTER — Ambulatory Visit (HOSPITAL_COMMUNITY)
Admission: RE | Admit: 2022-10-03 | Discharge: 2022-10-03 | Disposition: A | Discharge status: Home / Self Care | Payer: 59 | Source: Ambulatory Visit | Attending: Acute Care | Admitting: Acute Care

## 2022-10-03 DIAGNOSIS — R911 Solitary pulmonary nodule: Secondary | ICD-10-CM | POA: Insufficient documentation

## 2022-10-03 DIAGNOSIS — F17210 Nicotine dependence, cigarettes, uncomplicated: Secondary | ICD-10-CM | POA: Diagnosis not present

## 2022-10-03 DIAGNOSIS — Z87891 Personal history of nicotine dependence: Secondary | ICD-10-CM | POA: Diagnosis not present

## 2022-10-20 ENCOUNTER — Other Ambulatory Visit: Payer: 59

## 2022-10-22 DIAGNOSIS — M6281 Muscle weakness (generalized): Secondary | ICD-10-CM | POA: Diagnosis not present

## 2022-11-09 ENCOUNTER — Other Ambulatory Visit (HOSPITAL_COMMUNITY): Payer: Self-pay | Admitting: Psychiatry

## 2022-11-09 DIAGNOSIS — F332 Major depressive disorder, recurrent severe without psychotic features: Secondary | ICD-10-CM

## 2022-11-20 DIAGNOSIS — M7912 Myalgia of auxiliary muscles, head and neck: Secondary | ICD-10-CM | POA: Diagnosis not present

## 2022-11-20 DIAGNOSIS — Z79899 Other long term (current) drug therapy: Secondary | ICD-10-CM | POA: Diagnosis not present

## 2022-11-20 DIAGNOSIS — M5417 Radiculopathy, lumbosacral region: Secondary | ICD-10-CM | POA: Diagnosis not present

## 2022-11-21 DIAGNOSIS — M6281 Muscle weakness (generalized): Secondary | ICD-10-CM | POA: Diagnosis not present

## 2022-11-24 ENCOUNTER — Encounter: Payer: Self-pay | Admitting: Pulmonary Disease

## 2022-11-24 ENCOUNTER — Ambulatory Visit: Payer: 59 | Admitting: Pulmonary Disease

## 2022-11-24 VITALS — BP 170/100 | HR 82 | Temp 98.0°F | Ht 65.0 in | Wt 142.0 lb

## 2022-11-24 DIAGNOSIS — R911 Solitary pulmonary nodule: Secondary | ICD-10-CM

## 2022-11-24 DIAGNOSIS — J441 Chronic obstructive pulmonary disease with (acute) exacerbation: Secondary | ICD-10-CM

## 2022-11-24 DIAGNOSIS — Z23 Encounter for immunization: Secondary | ICD-10-CM

## 2022-11-24 DIAGNOSIS — F17210 Nicotine dependence, cigarettes, uncomplicated: Secondary | ICD-10-CM

## 2022-11-24 MED ORDER — PREDNISONE 10 MG PO TABS: 40.0000 mg | ORAL_TABLET | Freq: Every day | ORAL | 0 refills | Status: AC

## 2022-11-24 MED ORDER — AMLODIPINE BESYLATE 10 MG PO TABS: 10.0000 mg | ORAL_TABLET | Freq: Every day | ORAL | 3 refills | Status: DC

## 2022-11-24 MED ORDER — NICOTINE 21 MG/24HR TD PT24: 21.0000 mg | MEDICATED_PATCH | Freq: Every day | TRANSDERMAL | 0 refills | Status: DC

## 2022-11-24 NOTE — Patient Instructions (Signed)
VISIT SUMMARY:  During your visit today, we discussed your worsening symptoms of shortness of breath and coughing, which are related to your chronic obstructive pulmonary disease (COPD). We also talked about your ongoing smoking habit and the stable lung nodule that we've been monitoring.  YOUR PLAN:  -CHRONIC OBSTRUCTIVE PULMONARY DISEASE (COPD): COPD is a lung disease that makes it hard to breathe and is often caused by smoking. To help manage your increased symptoms, I am prescribing a 5-day course of Prednisone, a medication that can help reduce inflammation in your lungs and make breathing easier.  -TOBACCO USE DISORDER: Tobacco use disorder is a dependence on nicotine, which is found in cigarettes. I strongly advise you to quit smoking as it is worsening your COPD. I am resending a prescription for nicotine patches to [facility] to help you with this.  -LUNG NODULE: A lung nodule is a small growth in the lung that we've been monitoring. It has remained stable, which is good news. We will continue to keep an eye on it as per our current plan.  -GENERAL HEALTH MAINTENANCE: To help protect you from the flu, which can be particularly dangerous for people with lung conditions like COPD, we administered the influenza vaccine today.  INSTRUCTIONS:  Please start taking the Prednisone as prescribed. Also, please pick up and start using the nicotine patches as soon as they are available. Continue taking your Symbicort and Spiriva as usual. We will continue to monitor your lung nodule, and no further action is needed from you on this at this time. Remember to practice good hand hygiene to help prevent the spread of flu and other illnesses.

## 2022-11-24 NOTE — Progress Notes (Signed)
[name]    [id number]    1955/08/15  Primary Care Physician:Stacks, Broadus John, MD  Referring Physician: Mechele Claude, MD [address] [place],  [place] [address]  Chief complaint: Follow up for COPD  HPI: Active smoker with history of coronary artery disease, COPD Here for follow-up of COPD.  Maintained on Symbicort and Spiriva dyspnea with wheezing.    Evaluated for hypersensitivity pneumonitis in 2020 due to ongoing mold exposure.  CT is not typical for hypersensitivity ILD and HP panel is negative  Evaluated in the ED in January 2021 for pleuritic right chest pain with negative CTA, negative cardiac work-up  Pets: Has 2 dogs, no birds, farm animals Occupation: Used to work in a Radio broadcast assistant.  Currently on disability Exposures: Reports living in a home with significant mold exposure for the past 10 months. Smoking history: 50 pack year smoker.  Continues to smoke 1 pack/day Travel history: No significant travel history Relevant family history: Sister has COPD.  Interim history: Complains of increasing cough with chest congestion, green mucus and dyspnea Continues to smoke 1 pack/day  Recently had a screening CT which showed stable right upper lobe groundglass nodule.    Outpatient Encounter Medications as of 11/24/2022  Medication Sig   acetaminophen (TYLENOL) 500 MG tablet Take 1,000 mg by mouth every 6 (six) hours as needed for moderate pain.   albuterol (VENTOLIN HFA) 108 (90 Base) MCG/ACT inhaler Inhale 2 puffs into the lungs every 6 (six) hours as needed for wheezing or shortness of breath.   amLODipine (NORVASC) 10 MG tablet TAKE ONE TABLET BY MOUTH DAILY   atorvastatin (LIPITOR) 40 MG tablet Take 1 tablet (40 mg total) by mouth at bedtime.   clonazePAM (KLONOPIN) 1 MG tablet Take 1 tablet (1 mg total) by mouth 3 (three) times daily as needed. for anxiety   cloNIDine (CATAPRES) 0.1 MG tablet TAKE 1 TABLET BY MOUTH THREE TIMES DAILY   dexlansoprazole  (DEXILANT) 60 MG capsule TAKE ONE CAPSULE BY MOUTH DAILY ON EMPTY STOMACH   DULoxetine (CYMBALTA) 60 MG capsule TAKE ONE CAPSULE BY MOUTH TWICE DAILY   gabapentin (NEURONTIN) 600 MG tablet Take 600 mg by mouth 3 (three) times daily.   ipratropium-albuterol (DUONEB) 0.5-2.5 (3) MG/3ML SOLN Take 3 mLs by nebulization every 6 (six) hours as needed.   lidocaine (LIDODERM) 5 % Place 1 patch onto the skin daily. Remove & Discard patch within 12 hours or as directed by MD   linaclotide (LINZESS) 145 MCG CAPS capsule Take 1 capsule (145 mcg total) by mouth daily. To regulate bowel movements   lisinopril (ZESTRIL) 20 MG tablet Take 1 tablet (20 mg total) by mouth daily.   naproxen sodium (ALEVE) 220 MG tablet Take 220 mg by mouth 2 (two) times daily as needed (pain).   ondansetron (ZOFRAN) 4 MG tablet Take 1 tablet (4 mg total) by mouth daily as needed for nausea or vomiting.   rOPINIRole (REQUIP) 3 MG tablet Take 1 tablet (3 mg total) by mouth at bedtime. (NEEDS TO BE SEEN BEFORE NEXT REFILL)   SYMBICORT 160-4.5 MCG/ACT inhaler Inhale 2 puffs into the lungs 2 (two) times daily.   Tiotropium Bromide Monohydrate (SPIRIVA RESPIMAT) 2.5 MCG/ACT AERS INHALE TWO PUFFS INTO THE LUNGS DAILY (   [DISCONTINUED] Ascorbic Acid (VITAMIN C) 1000 MG tablet Take 1,000 mg by mouth daily.   [DISCONTINUED] fexofenadine (ALLEGRA) 180 MG tablet Take 1 tablet (180 mg total) by mouth daily.   [  DISCONTINUED] Varenicline Tartrate, Starter, (CHANTIX STARTING MONTH PAK) 0.5 MG X 11 & 1 MG X 42 TBPK Take 1 0.5 mg tablet once daily for 3 days, increase to 1 0.5 mg tablet twice daily for 4 days, increase to 1 1 mg tablet twice daily. (Patient not taking: Reported on 11/24/2022)   [DISCONTINUED] Vitamin D, Ergocalciferol, (DRISDOL) 1.25 MG (50000 UNIT) CAPS capsule TAKE 1 CAPSULE BY MOUTH WEEKLY   No facility-administered encounter medications on file as of 11/24/2022.    Physical Exam: Blood pressure (!) 170/100, pulse 82, temperature  98 F (36.7 C), temperature source Oral, height 5\' 5"  (1.651 m), weight 142 lb (64.4 kg), SpO2 96%. Gen:      No acute distress HEENT:  EOMI, sclera anicteric Neck:     No masses; no thyromegaly Lungs:    Clear to auscultation bilaterally; normal respiratory effort CV:         Regular rate and rhythm; no murmurs Abd:      + bowel sounds; soft, non-tender; no palpable masses, no distension Ext:    No edema; adequate peripheral perfusion Skin:      Warm and dry; no rash Neuro: alert and oriented x 3 Psych: normal mood and affect   Data Reviewed: Imaging: CT chest screening 01/07/2018- small 3.4 mm right lower lobe lung nodule.  Mild emphysematous changes. CTA 03/06/2019-no pulmonary embolism or lung abnormality.  CT chest screening 03/22/2020- slightly smaller right lower lobe lung nodule measuring 2.6 mm, advanced atherosclerosis and aortic wall calcification. CT screening 03/24/2022-increase in right upper lobe groundglass nodule to 10 mm PET scan 04/03/2022-no significant FDG uptake Screening CT chest 10/03/2022-stable pulmonary nodule in the right upper lobe.  Emphysema. I have reviewed the images personally  PFTs: 12/05/2019 FVC 2.47 [76%], FEV1 1.58 [63%], F/F 64, TLC 6.21 [121%], DLCO 18.45 [90%] Moderate obstruction  Labs: CBC 05/25/2017-WBC 10.3, eos 1%, absolute eosinophil count 103 CBC 08/22/2019-WBC 8.9, eos 1%, 30 significant count 89 IgE 05/27/2018-7 Hypersensitivity panel 05/27/2018-negative  Assessment:  Moderate COPD With mild exacerbation today Continue Symbicort, Spiriva, nebs Pred 40 mg a day for 5 days  Lung nodule Has groundglass right upper lobe lung nodule.  PET scan is negative and follow-up CT shows stability of the nodule.  Will continue to monitor.  Active smoker Smoking cessation discussed.  She is willing to quit.  Prescribe nicotine patches.  She cannot tolerate Chantix due to mood changes. Reassess at return visit.  Time spent counseling-5  minutes  Coronary atherosclerosis, aortic valve calcification Blood pressure is high today She has not been taking Norvasc for the past 3 months and will need a refill until she can follow-up with cardiology and primary care Follows with Dr. Wyline Mood, cardiology  Plan/Recommendations: - Continue Symbicort, Spiriva - Prednisone for 5 days - Smoking cessation with nicotine - Restart Norvasc, continue lisinopril  Chilton Greathouse MD [facility] 11/24/2022, 9:30 AM  CC: Mechele Claude, MD

## 2022-11-25 ENCOUNTER — Telehealth (INDEPENDENT_AMBULATORY_CARE_PROVIDER_SITE_OTHER): Payer: 59 | Admitting: Psychiatry

## 2022-11-25 ENCOUNTER — Encounter (HOSPITAL_COMMUNITY): Payer: Self-pay | Admitting: Psychiatry

## 2022-11-25 DIAGNOSIS — F332 Major depressive disorder, recurrent severe without psychotic features: Secondary | ICD-10-CM | POA: Diagnosis not present

## 2022-11-25 DIAGNOSIS — F411 Generalized anxiety disorder: Secondary | ICD-10-CM

## 2022-11-25 MED ORDER — CLONAZEPAM 1 MG PO TABS
1.0000 mg | ORAL_TABLET | Freq: Three times a day (TID) | ORAL | 2 refills | Status: DC | PRN
Start: 2022-11-25 — End: 2023-03-12

## 2022-11-25 MED ORDER — CLONAZEPAM 1 MG PO TABS
1.0000 mg | ORAL_TABLET | Freq: Three times a day (TID) | ORAL | 2 refills | Status: DC | PRN
Start: 2022-11-25 — End: 2022-11-25

## 2022-11-25 MED ORDER — DULOXETINE HCL 60 MG PO CPEP
60.0000 mg | ORAL_CAPSULE | Freq: Two times a day (BID) | ORAL | 2 refills | Status: DC
Start: 2022-11-25 — End: 2022-11-25

## 2022-11-25 MED ORDER — DULOXETINE HCL 60 MG PO CPEP
60.0000 mg | ORAL_CAPSULE | Freq: Two times a day (BID) | ORAL | 2 refills | Status: DC
Start: 2022-11-25 — End: 2023-01-23

## 2022-11-25 MED ORDER — CLONIDINE HCL 0.1 MG PO TABS: 0.1000 mg | ORAL_TABLET | Freq: Three times a day (TID) | ORAL | 2 refills | Status: DC

## 2022-11-25 NOTE — Progress Notes (Signed)
Virtual Visit via Telephone Note  I connected with [name] on 11/25/22 at  1:00 PM EDT by telephone and verified that I am speaking with the correct person using two identifiers.  Location: Patient: home Provider: office   I discussed the limitations, risks, security and privacy concerns of performing an evaluation and management service by telephone and the availability of in person appointments. I also discussed with the patient that there may be a patient responsible charge related to this service. The patient expressed understanding and agreed to proceed.      I discussed the assessment and treatment plan with the patient. The patient was provided an opportunity to ask questions and all were answered. The patient agreed with the plan and demonstrated an understanding of the instructions.   The patient was advised to call back or seek an in-person evaluation if the symptoms worsen or if the condition fails to improve as anticipated.  I provided 15 minutes of non-face-to-face time during this encounter.   Diannia Ruder, MD  [facility] MD/PA/NP OP Progress Note  11/25/2022 1:31 PM [name]  MRN:  [id number]  Chief Complaint:  Chief Complaint  Patient presents with   Anxiety   Depression   Follow-up   HPI: This patient is a 67 year old female who is widowed and lives alone in [place].  She is on disability.  The patient returns for follow-up after 3 months regarding her anxiety and depression.  She states that her last several doctor visits her blood pressure was very high.  In fact yesterday at the pulmonologist office it was 170/100.  She states that she has not had the amlodipine for a couple of months because she has not seen her cardiologist.  Fortunately her pulmonologist sent it in.  She is getting back on track with her medicines.  She still has not been able to get any pain medicine because of a fentanyl positive drug test last summer.  She states this was after she  cleaned up her son's house in which there was fentanyl on the table.  She is experiencing a lot of pain in her back and all of her joints.  She has to wait until she can have a clean drug test to get any referrals to pain management.  She is also worried about the trailer next-door that she owns.  Her son's girlfriend is living in it and bringing another man.  Her son is currently in jail.  She is asked the girl repeatedly to leave and she will out.  She claims she does not have the money to get her evicted.  Nevertheless she thinks the medicine for anxiety and depression have been helpful and she denies any thoughts of self-harm or suicide.  She is sleeping fairly well Visit Diagnosis:    ICD-10-CM   1. Severe episode of recurrent major depressive disorder, without psychotic features (HCC)  F33.2 DULoxetine (CYMBALTA) 60 MG capsule    DISCONTINUED: DULoxetine (CYMBALTA) 60 MG capsule    2. GAD (generalized anxiety disorder)  F41.1 clonazePAM (KLONOPIN) 1 MG tablet    DISCONTINUED: clonazePAM (KLONOPIN) 1 MG tablet      Past Psychiatric History: none  Past Medical History:  Past Medical History:  Diagnosis Date   Allergy    Arthritis    Atypical mole 06/05/2003   slight-mod-Left scapula (WS)   Atypical nevus 07/26/2003   persist. dysp- Left scapula (WS)   Basal cell carcinoma 06/05/2003   RIght chest (CX35FU)  Basal cell carcinoma 05/22/2004   superificial- RIght upper back- (CX35FU)   Basal cell carcinoma 11/27/2004   beside R nostril-(MOHS), below right outer nose (MOHS)   Basal cell carcinoma 11/08/2009   left post shoulder -(txpbx)   Basal cell carcinoma 05/24/2012   ulcerated- Left post shoulder- (CX35FU), ulcerated-mid forehead (EXC )   Basal cell carcinoma 06/24/2012   superificial- Right shoulder - (txpbx)    Basal cell carcinoma 07/29/2012    sclerosis- mid forehead (MOHS)   Basal cell carcinoma 06/06/2019   nod-right anterior neck (CX35FU)   Cataract    Closed  compression fracture of L5 vertebra (HCC)    Constipation    uses OTC meds with help   COPD (chronic obstructive pulmonary disease) (HCC)    Coronary artery disease    minimal nonobstructive    COVID    Depression    GERD (gastroesophageal reflux disease)    Heart murmur    Hx of adenomatous polyp of colon 10/03/2014   Hypercholesteremia    Hypertension    Panic attacks    Perimenopausal    Skin cancer    Squamous cell carcinoma of skin 06/05/2003   Right Temple(CX35FU)   Squamous cell carcinoma of skin 05/22/2004   in situ- Left upperarm (CX35FU)   Squamous cell carcinoma of skin 11/27/2004   in situ- above Left elbow (CX35FU)   Squamous cell carcinoma of skin 03/04/2006   in situ- Left sideburn (CX35FU)   Squamous cell carcinoma of skin 11/26/2006   in situ- mid brow (Cx35FU)   Squamous cell carcinoma of skin 05/24/2012   in situ- Left chest- (CX35FU)   Squamous cell carcinoma of skin 04/05/2013   in situ- Left nose (Cx35FU)   Squamous cell carcinoma of skin 08/17/2013   well diff-above Left eye (txpbx)   Squamous cell carcinoma of skin 07/30/2016   in situ- right chest sup (Cx35FU)   Squamous cell carcinoma of skin 05/12/2017   in situ- RIght shoulder (CX35FU), in situ- Left forehead (CX35FU)   Squamous cell carcinoma of skin 11/23/2018   in situ- Right mid shin, inner- (MOHS)   Tobacco user     Past Surgical History:  Procedure Laterality Date   ABDOMINAL HYSTERECTOMY     CATARACT EXTRACTION W/PHACO Right 03/01/2021   Procedure: CATARACT EXTRACTION PHACO AND INTRAOCULAR LENS PLACEMENT (IOC);  Surgeon: Fabio Pierce, MD;  Location: [facility];  Service: Ophthalmology;  Laterality: Right;  CDE: 7.31   CATARACT EXTRACTION W/PHACO Left 05/16/2021   Procedure: CATARACT EXTRACTION PHACO AND INTRAOCULAR LENS PLACEMENT (IOC);  Surgeon: Fabio Pierce, MD;  Location: [facility];  Service: Ophthalmology;  Laterality: Left;  CDE 9.41    COLONOSCOPY     IR VERTEBROPLASTY CERV/THOR BX  INC UNI/BIL INC/INJECT/IMAGING  04/16/2021   Right foot toe surgery     UPPER GASTROINTESTINAL ENDOSCOPY     vocal cord     polyp removal    Family Psychiatric History: see below  Family History:  Family History  Problem Relation Age of Onset   Heart disease Mother    Heart attack Mother    Heart attack Father    Colon polyps Sister    Esophageal cancer Sister    Anxiety disorder Sister    Depression Sister    Colon polyps Sister    Heart attack Brother    Colon cancer Brother 1       died at age 58   Anxiety disorder Brother    Depression Brother  Alcohol abuse Brother    Colon polyps Brother    Colon polyps Brother    Drug abuse Son    Breast cancer Niece    Rectal cancer Neg Hx    Stomach cancer Neg Hx     Social History:  Social History   Socioeconomic History   Marital status: Single    Spouse name: Not on file   Number of children: 2   Years of education: Not on file   Highest education level: Not on file  Occupational History   Occupation: Conservation officer, 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   Sexual activity: Not on file  Other Topics Concern   Not on file  Social History Narrative   Retired/ lives alone in one level home   Son and daughter in law that live next door and help her out a lot         Social Determinants of Health   Financial Resource Strain: Medium Risk (04/16/2022)   Overall Financial Resource Strain (CARDIA)    Difficulty of Paying Living Expenses: Somewhat  hard  Food Insecurity: No Food Insecurity (04/16/2022)   Hunger Vital Sign    Worried About Running Out of Food in the Last Year: Never true    Ran Out of Food in the Last Year: Never true  Transportation Needs: No Transportation Needs (04/16/2022)   PRAPARE - Administrator, Civil Service (Medical): No    Lack of Transportation (Non-Medical): No  Physical Activity: Inactive (04/16/2022)   Exercise Vital Sign    Days of Exercise per Week: 0 days    Minutes of Exercise per Session: 0 min  Stress: Stress Concern Present (04/16/2022)   [employer] of Occupational Health - Occupational Stress Questionnaire    Feeling of Stress : To some extent  Social Connections: Socially Isolated (04/16/2022)   Social Connection and Isolation Panel [NHANES]    Frequency of Communication with Friends and Family: More than three times a week    Frequency of Social Gatherings with Friends and Family: More than three times a week    Attends Religious Services: Never    Database administrator or Organizations: No    Attends Banker Meetings: Never    Marital Status: Divorced    Allergies:  Allergies  Allergen Reactions   Lorcet 10-650 [Hydrocodone-Acetaminophen] Itching   Sulfa Antibiotics Other (See Comments)    aching   Tape Other (See Comments)    Tear skin. Please use paper tape   Wellbutrin Xl [Bupropion Hcl Er (Xl)]     Upset stomach /bad taste in mouth    Metabolic Disorder Labs: Lab Results  Component Value Date   HGBA1C 5.9 (H) 07/12/2013   MPG 123 (H) 07/12/2013   No results found for: "PROLACTIN" Lab Results  Component Value Date   CHOL 191 06/17/2022   TRIG 157 (H) 06/17/2022  HDL 51 06/17/2022   CHOLHDL 3.7 06/17/2022   VLDL 36 07/13/2013   LDLCALC 112 (H) 06/17/2022   LDLCALC 86 12/16/2021   Lab Results  Component Value Date   TSH 1.820 05/25/2017   TSH 1.080 05/17/2015    Therapeutic Level Labs: No results found for: "LITHIUM" No results  found for: "VALPROATE" No results found for: "CBMZ"  Current Medications: Current Outpatient Medications  Medication Sig Dispense Refill   acetaminophen (TYLENOL) 500 MG tablet Take 1,000 mg by mouth every 6 (six) hours as needed for moderate pain.     albuterol (VENTOLIN HFA) 108 (90 Base) MCG/ACT inhaler Inhale 2 puffs into the lungs every 6 (six) hours as needed for wheezing or shortness of breath. 8 g 5   amLODipine (NORVASC) 10 MG tablet Take 1 tablet (10 mg total) by mouth daily. 90 tablet 3   atorvastatin (LIPITOR) 40 MG tablet Take 1 tablet (40 mg total) by mouth at bedtime. 90 tablet 3   clonazePAM (KLONOPIN) 1 MG tablet Take 1 tablet (1 mg total) by mouth 3 (three) times daily as needed. for anxiety 90 tablet 2   cloNIDine (CATAPRES) 0.1 MG tablet TAKE 1 TABLET BY MOUTH THREE TIMES DAILY 90 tablet 2   dexlansoprazole (DEXILANT) 60 MG capsule TAKE ONE CAPSULE BY MOUTH DAILY ON EMPTY STOMACH 90 capsule 2   DULoxetine (CYMBALTA) 60 MG capsule Take 1 capsule (60 mg total) by mouth 2 (two) times daily. 60 capsule 2   gabapentin (NEURONTIN) 600 MG tablet Take 600 mg by mouth 3 (three) times daily.     ipratropium-albuterol (DUONEB) 0.5-2.5 (3) MG/3ML SOLN Take 3 mLs by nebulization every 6 (six) hours as needed. 360 mL 5   lidocaine (LIDODERM) 5 % Place 1 patch onto the skin daily. Remove & Discard patch within 12 hours or as directed by MD 30 patch 0   linaclotide (LINZESS) 145 MCG CAPS capsule Take 1 capsule (145 mcg total) by mouth daily. To regulate bowel movements 30 capsule 5   lisinopril (ZESTRIL) 20 MG tablet Take 1 tablet (20 mg total) by mouth daily. 90 tablet 3   naproxen sodium (ALEVE) 220 MG tablet Take 220 mg by mouth 2 (two) times daily as needed (pain).     nicotine (NICODERM CQ - DOSED IN MG/24 HOURS) 21 mg/24hr patch Place 1 patch (21 mg total) onto the skin daily. 28 patch 0   ondansetron (ZOFRAN) 4 MG tablet Take 1 tablet (4 mg total) by mouth daily as needed for nausea  or vomiting. 30 tablet 0   predniSONE (DELTASONE) 10 MG tablet Take 4 tablets (40 mg total) by mouth daily with breakfast for 5 days. 20 tablet 0   rOPINIRole (REQUIP) 3 MG tablet Take 1 tablet (3 mg total) by mouth at bedtime. (NEEDS TO BE SEEN BEFORE NEXT REFILL) 90 tablet 1   SYMBICORT 160-4.5 MCG/ACT inhaler Inhale 2 puffs into the lungs 2 (two) times daily. 1 each 12   Tiotropium Bromide Monohydrate (SPIRIVA RESPIMAT) 2.5 MCG/ACT AERS INHALE TWO PUFFS INTO THE LUNGS DAILY ( 4 g 5   No current facility-administered medications for this visit.     Musculoskeletal: Strength & Muscle Tone: na Gait & Station: na Patient leans: N/A  Psychiatric Specialty Exam: Review of Systems  Musculoskeletal:  Positive for arthralgias, back pain, joint swelling and myalgias.  All other systems reviewed and are negative.   There were no vitals taken for this visit.There is no height or weight on file to  calculate BMI.  General Appearance: NA  Eye Contact:  NA  Speech:  normal  Volume:  Normal  Mood:  Anxious  Affect:  NA  Thought Process:  Goal Directed  Orientation:  Full (Time, Place, and Person)  Thought Content: Rumination   Suicidal Thoughts:  No  Homicidal Thoughts:  No  Memory:  Immediate;   Good Recent;   Good Remote;   NA  Judgement:  Good  Insight:  Fair  Psychomotor Activity:  Decreased  Concentration:  Concentration: Good and Attention Span: Good  Recall:  Good  Fund of Knowledge: Good  Language: Good  Akathisia:  No  Handed:  Right  AIMS (if indicated): not done  Assets:  Communication Skills Desire for Improvement Resilience  ADL's:  Intact  Cognition: WNL  Sleep:  Good   Screenings: GAD-7    Flowsheet Row Office Visit from 08/20/2022 in [facility] Office Visit from 06/17/2022 in [facility] Office Visit from 12/16/2021 in [facility] Office Visit from 06/17/2021  in [facility] Office Visit from 05/01/2021 in [facility]  Total GAD-7 Score 14 9 16 5 7       PHQ2-9    Flowsheet Row Office Visit from 08/20/2022 in [facility] Office Visit from 06/17/2022 in [facility] Clinical Support from 04/16/2022 in [facility] Office Visit from 12/16/2021 in [facility] Video Visit from 10/21/2021 in [facility] Total Score 4 2 4 4 4   PHQ-9 Total Score 14 4 11 10 8       Flowsheet Row Video Visit from 10/21/2021 in [facility] [place] Video Visit from 07/22/2021 in [facility] [place] Admission (Discharged) from 05/16/2021 in [facility] [facility]  C-SSRS RISK CATEGORY No Risk No Risk No Risk        Assessment and Plan: This patient is a 67 year old female with a history of depression and anxiety.  She is doing fairly well on her current regimen.  She will continue Cymbalta 60 mg twice daily for depression, clonidine 0.1 mg 3 times daily for anxiety and clonazepam 1 mg 3 times daily also for anxiety.  She will return to see me in 3 months  Collaboration of Care: Collaboration of Care: Primary Care Provider AEB notes are shared with PCP through the epic system  Patient/Guardian was advised Release of Information must be obtained prior to any record release in order to collaborate their care with an outside provider. Patient/Guardian was advised if they have not already done so to contact the registration department to sign all necessary forms in order for [place] to release information regarding their care.   Consent: Patient/Guardian gives verbal consent for treatment and assignment of benefits for services provided during this visit.  Patient/Guardian expressed understanding and agreed to proceed.    Diannia Ruder, MD 11/25/2022, 1:31 PM

## 2022-12-01 ENCOUNTER — Ambulatory Visit: Payer: 59 | Attending: Cardiology | Admitting: Cardiology

## 2022-12-01 ENCOUNTER — Encounter: Payer: Self-pay | Admitting: Cardiology

## 2022-12-01 VITALS — BP 150/100 | HR 92 | Ht 65.0 in | Wt 142.6 lb

## 2022-12-01 DIAGNOSIS — R0789 Other chest pain: Secondary | ICD-10-CM | POA: Diagnosis not present

## 2022-12-01 DIAGNOSIS — I350 Nonrheumatic aortic (valve) stenosis: Secondary | ICD-10-CM

## 2022-12-01 DIAGNOSIS — E782 Mixed hyperlipidemia: Secondary | ICD-10-CM | POA: Diagnosis not present

## 2022-12-01 DIAGNOSIS — I10 Essential (primary) hypertension: Secondary | ICD-10-CM | POA: Diagnosis not present

## 2022-12-01 MED ORDER — LISINOPRIL 40 MG PO TABS
40.0000 mg | ORAL_TABLET | Freq: Every day | ORAL | 3 refills | Status: DC
Start: 2022-12-01 — End: 2023-07-24

## 2022-12-01 MED ORDER — ATORVASTATIN CALCIUM 80 MG PO TABS
80.0000 mg | ORAL_TABLET | Freq: Every day | ORAL | 3 refills | Status: DC
Start: 2022-12-01 — End: 2023-07-24

## 2022-12-01 NOTE — Patient Instructions (Signed)
Medication Instructions:  Your physician has recommended you make the following change in your medication:   Increase Lisinopril to 40 mg once daily. Increase Atorvastatin to 80 mg once daily.  *If you need a refill on your cardiac medications before your next appointment, please call your pharmacy*   Lab Work: In 2 weeks: -BMET  If you have labs (blood work) drawn today and your tests are completely normal, you will receive your results only by: MyChart Message (if you have MyChart) OR A paper copy in the mail If you have any lab test that is abnormal or we need to change your treatment, we will call you to review the results.   Testing/Procedures: Your physician has requested that you have an echocardiogram. Echocardiography is a painless test that uses sound waves to create images of your heart. It provides your doctor with information about the size and shape of your heart and how well your heart's chambers and valves are working. This procedure takes approximately one hour. There are no restrictions for this procedure. Please do NOT wear cologne, perfume, aftershave, or lotions (deodorant is allowed). Please arrive 15 minutes prior to your appointment time.    Follow-Up: At [facility], you and your health needs are our priority.  As part of our continuing mission to provide you with exceptional heart care, we have created designated Provider Care Teams.  These Care Teams include your primary Cardiologist (physician) and Advanced Practice Providers (APPs -  Physician Assistants and Nurse Practitioners) who all work together to provide you with the care you need, when you need it.  We recommend signing up for the patient portal called "MyChart".  Sign up information is provided on this After Visit Summary.  MyChart is used to connect with patients for Virtual Visits (Telemedicine).  Patients are able to view lab/test results, encounter notes, upcoming appointments, etc.   Non-urgent messages can be sent to your provider as well.   To learn more about what you can do with MyChart, go to [email or web address].    Your next appointment:   6 month(s)  Provider:   You may see Dina Rich, MD or one of the following Advanced Practice Providers on your designated Care Team:   Randall An, PA-C  Jacolyn Reedy, [place]      Other Instructions Please keep a blood pressure log. Please bring your log back to the office for review. GOAL: >130/80

## 2022-12-01 NOTE — Progress Notes (Signed)
Clinical Summary Ms. Devoss is a 68 y.o.female seen today for follow up of the following medical problems.    1. Atypical chest pain/CAD - long history of atypical chest pain - cath 2008 nonobstrucive CAD - 07/2013 nuclear stress: no ischemia 01/2018 CT for lung cancer screening showed aortic atherosclerosis, with LM disease, LAD, and LCX      CAD risk factors: hyperlipidemia, HTN, +tobacco, both parents MIs in 40 to 54s, brother MI in his 59s   Jan 2020 nuclear stress no ischemia     - no recent chest pains. Chronic SOB related to her COPD   2. COPD - followed by pulmonary       3. HTN - home bp's typically around 150-160s/90s - she is compliant with meds     4. Hyperlipidemia   08/2020 TC 172 TG 152 HDL 50 LDL 96 - 06/2022 TC 098 TG 119 HDL 51 LDL 112 - she is on atorvastatin 40mg  daily   5. Aortic stenosis - 01/2020 echo mild AS mean grad 12 AVA VTI 1.69 - needs repeats in 2024  6. Anxiety - followed by behavioral health - clonidine for anxiety as opposed for her HTN   Past Medical History:  Diagnosis Date   Allergy    Arthritis    Atypical mole 06/05/2003   slight-mod-Left scapula (WS)   Atypical nevus 07/26/2003   persist. dysp- Left scapula (WS)   Basal cell carcinoma 06/05/2003   RIght chest (CX35FU)   Basal cell carcinoma 05/22/2004   superificial- RIght upper back- (CX35FU)   Basal cell carcinoma 11/27/2004   beside R nostril-(MOHS), below right outer nose (MOHS)   Basal cell carcinoma 11/08/2009   left post shoulder -(txpbx)   Basal cell carcinoma 05/24/2012   ulcerated- Left post shoulder- (CX35FU), ulcerated-mid forehead (EXC )   Basal cell carcinoma 06/24/2012   superificial- Right shoulder - (txpbx)    Basal cell carcinoma 07/29/2012    sclerosis- mid forehead (MOHS)   Basal cell carcinoma 06/06/2019   nod-right anterior neck (CX35FU)   Cataract    Closed compression fracture of L5 vertebra (HCC)    Constipation    uses OTC  meds with help   COPD (chronic obstructive pulmonary disease) (HCC)    Coronary artery disease    minimal nonobstructive    COVID    Depression    GERD (gastroesophageal reflux disease)    Heart murmur    Hx of adenomatous polyp of colon 10/03/2014   Hypercholesteremia    Hypertension    Panic attacks    Perimenopausal    Skin cancer    Squamous cell carcinoma of skin 06/05/2003   Right [place](CX35FU)   Squamous cell carcinoma of skin 05/22/2004   in situ- Left upperarm (CX35FU)   Squamous cell carcinoma of skin 11/27/2004   in situ- above Left elbow (CX35FU)   Squamous cell carcinoma of skin 03/04/2006   in situ- Left sideburn (CX35FU)   Squamous cell carcinoma of skin 11/26/2006   in situ- mid brow (Cx35FU)   Squamous cell carcinoma of skin 05/24/2012   in situ- Left chest- (CX35FU)   Squamous cell carcinoma of skin 04/05/2013   in situ- Left nose (Cx35FU)   Squamous cell carcinoma of skin 08/17/2013   well diff-above Left eye (txpbx)   Squamous cell carcinoma of skin 07/30/2016   in situ- right chest sup (Cx35FU)   Squamous cell carcinoma of skin 05/12/2017   in situ- RIght shoulder (CX35FU),  in situ- Left forehead (CX35FU)   Squamous cell carcinoma of skin 11/23/2018   in situ- Right mid shin, inner- (MOHS)   Tobacco user      Allergies  Allergen Reactions   Lorcet 10-650 [Hydrocodone-Acetaminophen] Itching   Sulfa Antibiotics Other (See Comments)    aching   Tape Other (See Comments)    Tear skin. Please use paper tape   Wellbutrin Xl [Bupropion Hcl Er (Xl)]     Upset stomach /bad taste in mouth     Current Outpatient Medications  Medication Sig Dispense Refill   acetaminophen (TYLENOL) 500 MG tablet Take 1,000 mg by mouth every 6 (six) hours as needed for moderate pain.     albuterol (VENTOLIN HFA) 108 (90 Base) MCG/ACT inhaler Inhale 2 puffs into the lungs every 6 (six) hours as needed for wheezing or shortness of breath. 8 g 5   amLODipine (NORVASC) 10  MG tablet Take 1 tablet (10 mg total) by mouth daily. 90 tablet 3   atorvastatin (LIPITOR) 40 MG tablet Take 1 tablet (40 mg total) by mouth at bedtime. 90 tablet 3   clonazePAM (KLONOPIN) 1 MG tablet Take 1 tablet (1 mg total) by mouth 3 (three) times daily as needed. for anxiety 90 tablet 2   cloNIDine (CATAPRES) 0.1 MG tablet Take 1 tablet (0.1 mg total) by mouth 3 (three) times daily. 90 tablet 2   dexlansoprazole (DEXILANT) 60 MG capsule TAKE ONE CAPSULE BY MOUTH DAILY ON EMPTY STOMACH 90 capsule 2   DULoxetine (CYMBALTA) 60 MG capsule Take 1 capsule (60 mg total) by mouth 2 (two) times daily. 60 capsule 2   gabapentin (NEURONTIN) 600 MG tablet Take 600 mg by mouth 3 (three) times daily.     ipratropium-albuterol (DUONEB) 0.5-2.5 (3) MG/3ML SOLN Take 3 mLs by nebulization every 6 (six) hours as needed. 360 mL 5   lidocaine (LIDODERM) 5 % Place 1 patch onto the skin daily. Remove & Discard patch within 12 hours or as directed by MD 30 patch 0   linaclotide (LINZESS) 145 MCG CAPS capsule Take 1 capsule (145 mcg total) by mouth daily. To regulate bowel movements 30 capsule 5   lisinopril (ZESTRIL) 20 MG tablet Take 1 tablet (20 mg total) by mouth daily. 90 tablet 3   naproxen sodium (ALEVE) 220 MG tablet Take 220 mg by mouth 2 (two) times daily as needed (pain).     nicotine (NICODERM CQ - DOSED IN MG/24 HOURS) 21 mg/24hr patch Place 1 patch (21 mg total) onto the skin daily. 28 patch 0   ondansetron (ZOFRAN) 4 MG tablet Take 1 tablet (4 mg total) by mouth daily as needed for nausea or vomiting. 30 tablet 0   rOPINIRole (REQUIP) 3 MG tablet Take 1 tablet (3 mg total) by mouth at bedtime. (NEEDS TO BE SEEN BEFORE NEXT REFILL) 90 tablet 1   SYMBICORT 160-4.5 MCG/ACT inhaler Inhale 2 puffs into the lungs 2 (two) times daily. 1 each 12   Tiotropium Bromide Monohydrate (SPIRIVA RESPIMAT) 2.5 MCG/ACT AERS INHALE TWO PUFFS INTO THE LUNGS DAILY ( 4 g 5   No current facility-administered medications for  this visit.     Past Surgical History:  Procedure Laterality Date   ABDOMINAL HYSTERECTOMY     CATARACT EXTRACTION W/PHACO Right 03/01/2021   Procedure: CATARACT EXTRACTION PHACO AND INTRAOCULAR LENS PLACEMENT (IOC);  Surgeon: Fabio Pierce, MD;  Location: [facility];  Service: Ophthalmology;  Laterality: Right;  CDE: 7.31   CATARACT EXTRACTION W/PHACO Left  05/16/2021   Procedure: CATARACT EXTRACTION PHACO AND INTRAOCULAR LENS PLACEMENT (IOC);  Surgeon: Fabio Pierce, MD;  Location: [facility];  Service: Ophthalmology;  Laterality: Left;  CDE 9.41    COLONOSCOPY     IR VERTEBROPLASTY CERV/THOR BX INC UNI/BIL INC/INJECT/IMAGING  04/16/2021   Right foot toe surgery     UPPER GASTROINTESTINAL ENDOSCOPY     vocal cord     polyp removal     Allergies  Allergen Reactions   Lorcet 10-650 [Hydrocodone-Acetaminophen] Itching   Sulfa Antibiotics Other (See Comments)    aching   Tape Other (See Comments)    Tear skin. Please use paper tape   Wellbutrin Xl [Bupropion Hcl Er (Xl)]     Upset stomach /bad taste in mouth      Family History  Problem Relation Age of Onset   Heart disease Mother    Heart attack Mother    Heart attack Father    Colon polyps Sister    Esophageal cancer Sister    Anxiety disorder Sister    Depression Sister    Colon polyps Sister    Heart attack Brother    Colon cancer Brother 12       died at age 54   Anxiety disorder Brother    Depression Brother    Alcohol abuse Brother    Colon polyps Brother    Colon polyps Brother    Drug abuse Son    Breast cancer Niece    Rectal cancer Neg Hx    Stomach cancer Neg Hx      Social History Ms. Canino reports that she has been smoking cigarettes. She started smoking about 53 years ago. She has a 53.6 pack-year smoking history. She has never used smokeless tobacco. Ms. Tun reports no history of alcohol use.   Review of Systems CONSTITUTIONAL: No weight loss, fever, chills, weakness or fatigue.  HEENT: Eyes:  No visual loss, blurred vision, double vision or yellow sclerae.No hearing loss, sneezing, congestion, runny nose or sore throat.  SKIN: No rash or itching.  CARDIOVASCULAR: per hpi RESPIRATORY: No shortness of breath, cough or sputum.  GASTROINTESTINAL: No anorexia, nausea, vomiting or diarrhea. No abdominal pain or blood.  GENITOURINARY: No burning on urination, no polyuria NEUROLOGICAL: No headache, dizziness, syncope, paralysis, ataxia, numbness or tingling in the extremities. No change in bowel or bladder control.  MUSCULOSKELETAL: No muscle, back pain, joint pain or stiffness.  LYMPHATICS: No enlarged nodes. No history of splenectomy.  PSYCHIATRIC: No history of depression or anxiety.  ENDOCRINOLOGIC: No reports of sweating, cold or heat intolerance. No polyuria or polydipsia.  Marland Kitchen   Physical Examination Today's Vitals   12/01/22 1402 12/01/22 1410  BP: (!) 158/90 (!) 150/100  Pulse: 92   SpO2: 95%   Weight: 142 lb 9.6 oz (64.7 kg)   Height: 5\' 5"  (1.651 m)    Body mass index is 23.73 kg/m.  Gen: resting comfortably, no acute distress HEENT: no scleral icterus, pupils equal round and reactive, no palptable cervical adenopathy,  CV: RRR, 3/6 systolic murmur rusb, no jvd Resp: bilateral wheezing GI: abdomen is soft, non-tender, non-distended, normal bowel sounds, no hepatosplenomegaly MSK: extremities are warm, no edema.  Skin: warm, no rash Neuro:  no focal deficits Psych: appropriate affect   Diagnostic Studies  06/2006 cath FINDINGS:  Aortic pressure 125/78 with a mean of 99. Left ventricular  pressure 125/5 with an end-diastolic pressure of 10.    The left mainstem is angiographically normal.  It  bifurcates into the  LAD and left circumflex.    The LAD is a large-caliber vessel that courses down to the LV apex.  It  gives off a first diagonal Umer Harig and is a large vessel.  There is no  significant disease in the LAD or diagonal system.    Left circumflex is a  large-caliber vessel.  There is a small  intermediate Terrye Dombrosky present.  There are really no substantial obtuse  marginal branches from the left circumflex present.  The left circumflex  courses down the AV groove and gives off a large posterolateral Baila Rouse.  The mid portion of the left circumflex has a long segment of 30-40%  stenosis.    The right coronary artery is a large-caliber vessel.  It terminates  distally in a PDA and posterior AV segment which gives off two  posterolateral branches.  The mid portion of the right coronary artery  provides a small RV marginal Emeril Stille, and the proximal portion has a  small conus Nelli Swalley present.  There are minor luminal irregularities in  the proximal portion of the right coronary artery, but there is no  significant angiographic disease.    Left ventriculography performed in the 30-degree RAO projection  demonstrates normal left ventricular systolic function with an LVEF of  55%.  There is no mitral regurgitation.    ASSESSMENT:  1. Nonobstructive coronary artery disease, predominantly involving the      left circumflex system.  2. Normal left ventricular function.  3. Intracoronary vascular ultrasound demonstration of minimal plaque      in the right coronary artery.    PLAN:  Ms. Lizardo will require aggressive medical therapy.  She needs  tobacco cessation and progressive statin therapy.  She will receive  her statin therapy through the [facility] protocol.  She should be on a  daily aspirin as well.  She will be a candidate for early discharge as  her chest pain appears to have been noncardiac, and she has had no  further pain.     Jan 2020 nuclear stress There was no ST segment deviation noted during stress. Defect 1: There is a medium defect of mild severity present in the basal inferior, mid inferior and apical inferior location. This is likely due to soft tissue attenuation. No evidence of ischemia. This is a low risk study. Nuclear  stress EF: 58%.   01/2020 echo IMPRESSIONS     1. Left ventricular ejection fraction, by estimation, is 60 to 65%. The  left ventricle has normal function. The left ventricle has no regional  wall motion abnormalities. Left ventricular diastolic parameters are  consistent with Grade I diastolic  dysfunction (impaired relaxation).   2. Right ventricular systolic function is normal. The right ventricular  size is normal.   3. The mitral valve is normal in structure. No evidence of mitral valve  regurgitation. No evidence of mitral stenosis.   4. The aortic valve has an indeterminant number of cusps. There is mild  calcification of the aortic valve. There is mild thickening of the aortic  valve. Aortic valve regurgitation is mild. Mild aortic valve stenosis.  Mild aortic stenosis is present.  Aortic valve mean gradient measures 12.0 mmHg. Aortic valve peak gradient  measures 23.1 mmHg. Aortic valve area, by VTI measures 1.69 cm.   5. The inferior vena cava is normal in size with greater than 50%  respiratory variability, suggesting right atrial pressure of 3 mmHg.      Assessment and Plan  1. CAD/Atypical chest pain - long history of atypical chest pain - nonobstructive CAD by cath 2008, negative nuclear stress 2015 and more recently Jan 2020 -no recent symptoms, continue to monitor - EKG shows NSR, nonspecific ST/T changes   2.Aortic stenosis - mild AS by 2021 [place], she is due for repeat echo. We will order echo to look for any progression     3. HTN - above goal, increase lisinopril to 40mg  daily. BMET in 2 weeks.  - if not controlled would add diuretic, likely chlorthalidone.  - she will update [place] on blood pressures in 2 weeks.    4. Hyperlipidemia - above goal, increase atorvastatin to 80mg  dialy.      Antoine Poche, M.D.

## 2022-12-07 ENCOUNTER — Other Ambulatory Visit: Payer: Self-pay | Admitting: Family Medicine

## 2022-12-07 DIAGNOSIS — R251 Tremor, unspecified: Secondary | ICD-10-CM

## 2022-12-07 DIAGNOSIS — K5903 Drug induced constipation: Secondary | ICD-10-CM

## 2022-12-08 ENCOUNTER — Telehealth (HOSPITAL_COMMUNITY): Payer: Self-pay | Admitting: *Deleted

## 2022-12-08 NOTE — Telephone Encounter (Signed)
Need to resch patient Dec 23rd appt. Staff called patient and VM is full. unable to reach patient.

## 2022-12-10 ENCOUNTER — Ambulatory Visit: Payer: Medicare Other | Admitting: Cardiology

## 2022-12-11 ENCOUNTER — Ambulatory Visit: Payer: 59 | Attending: Cardiology

## 2022-12-11 DIAGNOSIS — I350 Nonrheumatic aortic (valve) stenosis: Secondary | ICD-10-CM | POA: Diagnosis not present

## 2022-12-15 LAB — ECHOCARDIOGRAM COMPLETE
AR max vel: 0.92 cm2
AV Area VTI: 1.27 cm2
AV Area mean vel: 0.94 cm2
AV Mean grad: 22 mm[Hg]
AV Peak grad: 39.2 mm[Hg]
Ao pk vel: 3.13 m/s
Area-P 1/2: 2.55 cm2
MV VTI: 3.11 cm2
S' Lateral: 2.6 cm
Single Plane A4C EF: 75.3 %

## 2022-12-15 NOTE — Patient Instructions (Signed)
Our records indicate that you are due for your screening mammogram.  Please call the imaging center that does your yearly mammograms to make an appointment for a mammogram at your earliest convenience. Our office also has a mobile unit through the [facility] that comes to our location. Please call our office if you would like to make an appointment.   

## 2022-12-17 ENCOUNTER — Ambulatory Visit: Payer: 59 | Admitting: Family Medicine

## 2022-12-17 ENCOUNTER — Encounter: Payer: Self-pay | Admitting: Family Medicine

## 2022-12-17 VITALS — BP 122/73 | HR 96 | Temp 97.3°F | Ht 65.0 in | Wt 138.8 lb

## 2022-12-17 DIAGNOSIS — M5126 Other intervertebral disc displacement, lumbar region: Secondary | ICD-10-CM

## 2022-12-17 DIAGNOSIS — E559 Vitamin D deficiency, unspecified: Secondary | ICD-10-CM | POA: Diagnosis not present

## 2022-12-17 DIAGNOSIS — J449 Chronic obstructive pulmonary disease, unspecified: Secondary | ICD-10-CM

## 2022-12-17 DIAGNOSIS — R251 Tremor, unspecified: Secondary | ICD-10-CM | POA: Diagnosis not present

## 2022-12-17 DIAGNOSIS — I10 Essential (primary) hypertension: Secondary | ICD-10-CM

## 2022-12-17 DIAGNOSIS — E782 Mixed hyperlipidemia: Secondary | ICD-10-CM | POA: Diagnosis not present

## 2022-12-17 DIAGNOSIS — F1120 Opioid dependence, uncomplicated: Secondary | ICD-10-CM

## 2022-12-17 MED ORDER — VITAMIN D (ERGOCALCIFEROL) 1.25 MG (50000 UNIT) PO CAPS
50000.0000 [IU] | ORAL_CAPSULE | ORAL | 3 refills | Status: DC
Start: 2022-12-17 — End: 2023-09-07

## 2022-12-17 MED ORDER — LEVOCETIRIZINE DIHYDROCHLORIDE 5 MG PO TABS: 5.0000 mg | ORAL_TABLET | Freq: Every evening | ORAL | 3 refills | Status: DC

## 2022-12-17 MED ORDER — ROPINIROLE HCL 3 MG PO TABS
3.0000 mg | ORAL_TABLET | Freq: Every day | ORAL | 3 refills | Status: DC
Start: 2022-12-17 — End: 2023-07-06

## 2022-12-17 MED ORDER — ALENDRONATE SODIUM 70 MG PO TABS: 70.0000 mg | ORAL_TABLET | ORAL | 3 refills | Status: DC

## 2022-12-17 NOTE — Progress Notes (Signed)
Subjective:  Patient ID: [name], female    DOB: 11/21/1955  Age: 67 y.o. MRN: [id number]  CC: Medical Management of Chronic Issues   HPI [name] presents for allergy symptoms, need for pain med referral, toxassure. Off meds, in a lot of pain due to fentanyl showing up on drug screen.   Having congestion, sniffles, sneezing. Pain in multiple joints including low back, intense interfering with activities.    presents for  follow-up of hypertension. Patient has no history of headache chest pain or shortness of breath or recent cough. Patient also denies symptoms of TIA such as focal numbness or weakness. Patient denies side effects from medication. States taking it regularly.      12/17/2022    2:03 PM 12/17/2022    1:50 PM 08/20/2022   11:29 AM  Depression screen PHQ 2/9  Decreased Interest 2 0 2  Down, Depressed, Hopeless 1 0 2  PHQ - 2 Score 3 0 4  Altered sleeping 2  1  Tired, decreased energy 3  2  Change in appetite 3  2  Feeling bad or failure about yourself  1  1  Trouble concentrating 2  2  Moving slowly or fidgety/restless 2  2  Suicidal thoughts 0  0  PHQ-9 Score 16  14  Difficult doing work/chores Very difficult  Very difficult    History [name] has a past medical history of Allergy, Arthritis, Atypical mole (06/05/2003), Atypical nevus (07/26/2003), Basal cell carcinoma (06/05/2003), Basal cell carcinoma (05/22/2004), Basal cell carcinoma (11/27/2004), Basal cell carcinoma (11/08/2009), Basal cell carcinoma (05/24/2012), Basal cell carcinoma (06/24/2012), Basal cell carcinoma (07/29/2012), Basal cell carcinoma (06/06/2019), Cataract, Closed compression fracture of L5 vertebra (HCC), Constipation, COPD (chronic obstructive pulmonary disease) (HCC), Coronary artery disease, COVID, Depression, GERD (gastroesophageal reflux disease), Heart murmur, adenomatous polyp of colon (10/03/2014), Hypercholesteremia, Hypertension, Panic attacks, Perimenopausal, Skin  cancer, Squamous cell carcinoma of skin (06/05/2003), Squamous cell carcinoma of skin (05/22/2004), Squamous cell carcinoma of skin (11/27/2004), Squamous cell carcinoma of skin (03/04/2006), Squamous cell carcinoma of skin (11/26/2006), Squamous cell carcinoma of skin (05/24/2012), Squamous cell carcinoma of skin (04/05/2013), Squamous cell carcinoma of skin (08/17/2013), Squamous cell carcinoma of skin (07/30/2016), Squamous cell carcinoma of skin (05/12/2017), Squamous cell carcinoma of skin (11/23/2018), and Tobacco user.   She has a past surgical history that includes Abdominal hysterectomy; vocal cord; Right foot toe surgery; Colonoscopy; Upper gastrointestinal endoscopy; Cataract extraction w/PHACO (Right, 03/01/2021); IR VERTEBROPLASTY CERV/THOR BX INC UNI/BIL INC/INJECT/IMAGING (04/16/2021); and Cataract extraction w/PHACO (Left, 05/16/2021).   Her family history includes Alcohol abuse in her brother; Anxiety disorder in her brother and sister; Breast cancer in her niece; Colon cancer (age of onset: 48) in her brother; Colon polyps in her brother, brother, sister, and sister; Depression in her brother and sister; Drug abuse in her son; Esophageal cancer in her sister; Heart attack in her brother, father, and mother; Heart disease in her mother.She reports that she has been smoking cigarettes. She started smoking about 53 years ago. She has a 53.6 pack-year smoking history. She has never used smokeless tobacco. She reports that she does not drink alcohol and does not use drugs.    ROS Review of Systems  Constitutional: Negative.   HENT: Negative.    Eyes:  Negative for visual disturbance.  Respiratory:  Negative for shortness of breath.   Cardiovascular:  Negative for chest pain.  Gastrointestinal:  Negative for abdominal pain.  Musculoskeletal:  Negative for arthralgias.  Objective:  BP 122/73   Pulse 96   Temp (!) 97.3 F (36.3 C)   Ht 5\' 5"  (1.651 m)   Wt 138 lb 12.8 oz (63 kg)    SpO2 96%   BMI 23.10 kg/m   BP Readings from Last 3 Encounters:  12/17/22 122/73  12/01/22 (!) 150/100  11/24/22 (!) 170/100    Wt Readings from Last 3 Encounters:  12/17/22 138 lb 12.8 oz (63 kg)  12/01/22 142 lb 9.6 oz (64.7 kg)  11/24/22 142 lb (64.4 kg)     Physical Exam Constitutional:      General: She is not in acute distress.    Appearance: She is well-developed. She is ill-appearing.  Cardiovascular:     Rate and Rhythm: Normal rate and regular rhythm.  Pulmonary:     Breath sounds: Normal breath sounds.  Musculoskeletal:        General: Normal range of motion.  Skin:    General: Skin is warm and dry.  Neurological:     Mental Status: She is alert and oriented to person, place, and time.     Motor: Tremor (hands, intention) present.  Psychiatric:        Mood and Affect: Mood is anxious. Affect is flat.        Speech: Speech normal.       Assessment & Plan:   [name] was seen today for medical management of chronic issues.  Diagnoses and all orders for this visit:  Essential hypertension -     CBC with Differential/Platelet -     CMP14+EGFR  Mixed hyperlipidemia -     Lipid panel  Tremor -     rOPINIRole (REQUIP) 3 MG tablet; Take 1 tablet (3 mg total) by mouth at bedtime.  Vitamin D deficiency -     VITAMIN D 25 Hydroxy (Vit-D Deficiency, Fractures)  Lumbar herniated disc -     Cancel: ToxASSURE Select 13 (MW), Urine -     ToxASSURE Select 13 (MW), Urine  Uncomplicated opioid dependence (HCC) -     Cancel: ToxASSURE Select 13 (MW), Urine -     ToxASSURE Select 13 (MW), Urine  Chronic obstructive pulmonary disease, unspecified COPD type (HCC) -     For home use only DME Nebulizer machine  Other orders -     alendronate (FOSAMAX) 70 MG tablet; Take 1 tablet (70 mg total) by mouth every 7 (seven) days. Take with a full glass of water on an empty stomach. Do not lay down for at least 2 hours -     Vitamin D, Ergocalciferol, (DRISDOL) 1.25 MG  (50000 UNIT) CAPS capsule; Take 1 capsule (50,000 Units total) by mouth every 7 (seven) days. -     levocetirizine (XYZAL) 5 MG tablet; Take 1 tablet (5 mg total) by mouth every evening.       I have changed [name]. [name]'s rOPINIRole. I am also having her start on alendronate, Vitamin D (Ergocalciferol), and levocetirizine. Additionally, I am having her maintain her ipratropium-albuterol, albuterol, lidocaine, gabapentin, acetaminophen, naproxen sodium, dexlansoprazole, Spiriva Respimat, Symbicort, ondansetron, nicotine, amLODipine, DULoxetine, clonazePAM, cloNIDine, atorvastatin, lisinopril, and Linzess.  Allergies as of 12/17/2022       Reactions   Fentanyl Other (See Comments)   Lorcet 10-650 [hydrocodone-acetaminophen] Itching   Sulfa Antibiotics Other (See Comments)   aching   Tape Other (See Comments)   Tear skin. Please use paper tape   Wellbutrin Xl [bupropion Hcl Er (xl)]    Upset stomach /  bad taste in mouth        Medication List        Accurate as of December 17, 2022 11:59 PM. If you have any questions, ask your nurse or doctor.          acetaminophen 500 MG tablet Commonly known as: TYLENOL Take 1,000 mg by mouth every 6 (six) hours as needed for moderate pain.   albuterol 108 (90 Base) MCG/ACT inhaler Commonly known as: VENTOLIN HFA Inhale 2 puffs into the lungs every 6 (six) hours as needed for wheezing or shortness of breath.   alendronate 70 MG tablet Commonly known as: FOSAMAX Take 1 tablet (70 mg total) by mouth every 7 (seven) days. Take with a full glass of water on an empty stomach. Do not lay down for at least 2 hours Started by: Solveig Fangman   amLODipine 10 MG tablet Commonly known as: NORVASC Take 1 tablet (10 mg total) by mouth daily.   atorvastatin 80 MG tablet Commonly known as: LIPITOR Take 1 tablet (80 mg total) by mouth daily.   clonazePAM 1 MG tablet Commonly known as: KLONOPIN Take 1 tablet (1 mg total) by mouth 3 (three)  times daily as needed. for anxiety   cloNIDine 0.1 MG tablet Commonly known as: CATAPRES Take 1 tablet (0.1 mg total) by mouth 3 (three) times daily.   dexlansoprazole 60 MG capsule Commonly known as: DEXILANT TAKE ONE CAPSULE BY MOUTH DAILY ON EMPTY STOMACH   DULoxetine 60 MG capsule Commonly known as: CYMBALTA Take 1 capsule (60 mg total) by mouth 2 (two) times daily.   gabapentin 600 MG tablet Commonly known as: NEURONTIN Take 600 mg by mouth 3 (three) times daily.   ipratropium-albuterol 0.5-2.5 (3) MG/3ML Soln Commonly known as: DUONEB Take 3 mLs by nebulization every 6 (six) hours as needed.   levocetirizine 5 MG tablet Commonly known as: XYZAL Take 1 tablet (5 mg total) by mouth every evening. Started by: Katryn Plummer   lidocaine 5 % Commonly known as: Lidoderm Place 1 patch onto the skin daily. Remove & Discard patch within 12 hours or as directed by MD   Linzess 145 MCG Caps capsule Generic drug: linaclotide TAKE 1 CAPSULE BY MOUTH DAILY TO regulate BOWEL MOVEMENT   lisinopril 40 MG tablet Commonly known as: ZESTRIL Take 1 tablet (40 mg total) by mouth daily.   naproxen sodium 220 MG tablet Commonly known as: ALEVE Take 220 mg by mouth 2 (two) times daily as needed (pain).   nicotine 21 mg/24hr patch Commonly known as: NICODERM CQ - dosed in mg/24 hours Place 1 patch (21 mg total) onto the skin daily.   ondansetron 4 MG tablet Commonly known as: Zofran Take 1 tablet (4 mg total) by mouth daily as needed for nausea or vomiting.   rOPINIRole 3 MG tablet Commonly known as: REQUIP Take 1 tablet (3 mg total) by mouth at bedtime. What changed: See the new instructions. Changed by: Shandria Clinch   Spiriva Respimat 2.5 MCG/ACT Aers Generic drug: Tiotropium Bromide Monohydrate INHALE TWO PUFFS INTO THE LUNGS DAILY (   Symbicort 160-4.5 MCG/ACT inhaler Generic drug: budesonide-formoterol Inhale 2 puffs into the lungs 2 (two) times daily.   Vitamin D  (Ergocalciferol) 1.25 MG (50000 UNIT) Caps capsule Commonly known as: DRISDOL Take 1 capsule (50,000 Units total) by mouth every 7 (seven) days. Started by: Mechele Claude               [facility]  (From  admission, onward)           Start     Ordered   12/17/22 0000  For home use only DME Nebulizer machine       Question Answer Comment  Patient needs a nebulizer to treat with the following condition COPD (chronic obstructive pulmonary disease) (HCC)   Length of Need Lifetime      12/17/22 1436             Follow-up: Return in about 3 months (around 03/19/2023).  Mechele Claude, M.D.

## 2022-12-18 ENCOUNTER — Encounter: Payer: Self-pay | Admitting: Cardiology

## 2022-12-18 LAB — CMP14+EGFR
ALT: 12 [IU]/L (ref 0–32)
AST: 16 [IU]/L (ref 0–40)
Albumin: 4.3 g/dL (ref 3.9–4.9)
Alkaline Phosphatase: 100 [IU]/L (ref 44–121)
BUN/Creatinine Ratio: 15 (ref 12–28)
BUN: 11 mg/dL (ref 8–27)
Bilirubin Total: 0.2 mg/dL (ref 0.0–1.2)
CO2: 26 mmol/L (ref 20–29)
Calcium: 9.9 mg/dL (ref 8.7–10.3)
Chloride: 102 mmol/L (ref 96–106)
Creatinine, Ser: 0.73 mg/dL (ref 0.57–1.00)
Globulin, Total: 2.4 g/dL (ref 1.5–4.5)
Glucose: 98 mg/dL (ref 70–99)
Potassium: 4.7 mmol/L (ref 3.5–5.2)
Sodium: 142 mmol/L (ref 134–144)
Total Protein: 6.7 g/dL (ref 6.0–8.5)
eGFR: 89 mL/min/{1.73_m2} (ref >59)

## 2022-12-18 LAB — CBC WITH DIFFERENTIAL/PLATELET
Basophils Absolute: 0 10*3/uL (ref 0.0–0.2)
Basos: 0 %
EOS (ABSOLUTE): 0.1 10*3/uL (ref 0.0–0.4)
Eos: 1 %
Hematocrit: 49.2 % — ABNORMAL HIGH (ref 34.0–46.6)
Hemoglobin: 15.8 g/dL (ref 11.1–15.9)
Immature Grans (Abs): 0 10*3/uL (ref 0.0–0.1)
Immature Granulocytes: 0 %
Lymphocytes Absolute: 2.4 10*3/uL (ref 0.7–3.1)
Lymphs: 22 %
MCH: 31.1 pg (ref 26.6–33.0)
MCHC: 32.1 g/dL (ref 31.5–35.7)
MCV: 97 fL (ref 79–97)
Monocytes Absolute: 0.7 10*3/uL (ref 0.1–0.9)
Monocytes: 6 %
Neutrophils Absolute: 7.7 10*3/uL — ABNORMAL HIGH (ref 1.4–7.0)
Neutrophils: 71 %
Platelets: 401 10*3/uL (ref 150–450)
RBC: 5.08 x10E6/uL (ref 3.77–5.28)
RDW: 13.7 % (ref 11.7–15.4)
WBC: 11 10*3/uL — ABNORMAL HIGH (ref 3.4–10.8)

## 2022-12-18 LAB — LIPID PANEL
Chol/HDL Ratio: 4.1 {ratio} (ref 0.0–4.4)
Cholesterol, Total: 165 mg/dL (ref 100–199)
HDL: 40 mg/dL (ref >39)
LDL Chol Calc (NIH): 92 mg/dL (ref 0–99)
Triglycerides: 191 mg/dL — ABNORMAL HIGH (ref 0–149)
VLDL Cholesterol Cal: 33 mg/dL (ref 5–40)

## 2022-12-18 LAB — VITAMIN D 25 HYDROXY (VIT D DEFICIENCY, FRACTURES): Vit D, 25-Hydroxy: 30.7 ng/mL (ref 30.0–100.0)

## 2022-12-19 ENCOUNTER — Telehealth: Payer: Self-pay | Admitting: Acute Care

## 2022-12-19 DIAGNOSIS — R911 Solitary pulmonary nodule: Secondary | ICD-10-CM

## 2022-12-19 LAB — TOXASSURE SELECT 13 (MW), URINE

## 2022-12-19 NOTE — Telephone Encounter (Signed)
Called and spoke to patient regarding her LDCT in 10/2022. The nodule in question has grown from 6.67mm in 03/2020 to 10.38mm in 03/2021 and 11.45mm in 10/2022. Because of this growth Kerry Robinsons NP would prefer patient have a 6 month follow up scan due in 04/2023. Patient verbalized understanding and is agreeable to this. Order placed for 6 month scan and results and plan sent to PCP.

## 2022-12-21 ENCOUNTER — Other Ambulatory Visit: Payer: Self-pay | Admitting: Family Medicine

## 2022-12-21 ENCOUNTER — Encounter: Payer: Self-pay | Admitting: Family Medicine

## 2022-12-21 DIAGNOSIS — M5126 Other intervertebral disc displacement, lumbar region: Secondary | ICD-10-CM

## 2022-12-23 DIAGNOSIS — M6281 Muscle weakness (generalized): Secondary | ICD-10-CM | POA: Diagnosis not present

## 2022-12-25 ENCOUNTER — Telehealth: Payer: Self-pay | Admitting: Family Medicine

## 2022-12-25 NOTE — Telephone Encounter (Signed)
REFERRAL REQUEST Telephone Note  Have you been seen at our office for this problem? Pain clinic and dermatology (Advise that they may need an appointment with their PCP before a referral can be done)  Reason for Referral: pt says that she discussed with Dr Darlyn Read about pain clinic referral. Pt was referred to dermatology clinic in [place] but the office does not accept Medicaid Referral discussed with patient: yes  Best contact number of patient for referral team: [phone]    Has patient been seen by a specialist for this issue before: no  Patient provider preference for referral: no preference as long as the office accepts [facility] and [facility] Patient location preference for referral: no preference as long as the office accepts [facility] and [facility]   Patient notified that referrals can take up to a week or longer to process. If they haven't heard anything within a week they should call back and speak with the referral department.

## 2022-12-26 ENCOUNTER — Other Ambulatory Visit: Payer: Self-pay | Admitting: *Deleted

## 2022-12-29 NOTE — Telephone Encounter (Signed)
Patient calling back to check the status of the dermatologist and pain clinic referral. Please call.

## 2022-12-29 NOTE — Telephone Encounter (Signed)
Pain Clinic Referral was just placed on 12/25/2022 - Referrals can take up to 2 weeks.  Only Dermatology Referral placed was back in April, Referral was sent to [facility] - Per fax they reached out to Patient with no call back in regards to Scheduling.

## 2022-12-30 ENCOUNTER — Other Ambulatory Visit: Payer: Self-pay | Admitting: Family Medicine

## 2022-12-30 DIAGNOSIS — D485 Neoplasm of uncertain behavior of skin: Secondary | ICD-10-CM

## 2022-12-30 NOTE — Telephone Encounter (Signed)
Referral placed, as requested WS 

## 2022-12-31 ENCOUNTER — Encounter: Payer: Self-pay | Admitting: Physical Medicine & Rehabilitation

## 2022-12-31 NOTE — Telephone Encounter (Signed)
Pt made aware of both referrals being placed.  Numbers given to their office for pt to call.  Pt states that she is also having pain in her shoulder that she has seen ortho in the past for. She would like to go back and see them. Advised pt to call them to see if she could directly schedule with them. She is unsure when she last seen them. If a referral is required advised pt to call here for an appt to discuss the shoulder with [facility]. Pt understood.

## 2023-01-08 DIAGNOSIS — D492 Neoplasm of unspecified behavior of bone, soft tissue, and skin: Secondary | ICD-10-CM | POA: Diagnosis not present

## 2023-01-08 DIAGNOSIS — L570 Actinic keratosis: Secondary | ICD-10-CM | POA: Diagnosis not present

## 2023-01-08 DIAGNOSIS — D0439 Carcinoma in situ of skin of other parts of face: Secondary | ICD-10-CM | POA: Diagnosis not present

## 2023-01-13 ENCOUNTER — Encounter: Payer: Self-pay | Admitting: Internal Medicine

## 2023-01-15 ENCOUNTER — Encounter: Payer: Self-pay | Admitting: Physical Medicine & Rehabilitation

## 2023-01-15 ENCOUNTER — Encounter: Payer: 59 | Attending: Physical Medicine & Rehabilitation | Admitting: Physical Medicine & Rehabilitation

## 2023-01-15 VITALS — BP 144/88 | HR 84 | Ht 65.0 in | Wt 140.0 lb

## 2023-01-15 DIAGNOSIS — Z79891 Long term (current) use of opiate analgesic: Secondary | ICD-10-CM | POA: Insufficient documentation

## 2023-01-15 DIAGNOSIS — M542 Cervicalgia: Secondary | ICD-10-CM | POA: Insufficient documentation

## 2023-01-15 DIAGNOSIS — G8929 Other chronic pain: Secondary | ICD-10-CM | POA: Diagnosis not present

## 2023-01-15 DIAGNOSIS — G894 Chronic pain syndrome: Secondary | ICD-10-CM | POA: Diagnosis not present

## 2023-01-15 DIAGNOSIS — M5441 Lumbago with sciatica, right side: Secondary | ICD-10-CM | POA: Diagnosis not present

## 2023-01-15 DIAGNOSIS — Z5181 Encounter for therapeutic drug level monitoring: Secondary | ICD-10-CM | POA: Insufficient documentation

## 2023-01-15 DIAGNOSIS — M25512 Pain in left shoulder: Secondary | ICD-10-CM | POA: Diagnosis not present

## 2023-01-15 DIAGNOSIS — G5603 Carpal tunnel syndrome, bilateral upper limbs: Secondary | ICD-10-CM | POA: Insufficient documentation

## 2023-01-15 DIAGNOSIS — M25511 Pain in right shoulder: Secondary | ICD-10-CM | POA: Diagnosis not present

## 2023-01-15 DIAGNOSIS — M5442 Lumbago with sciatica, left side: Secondary | ICD-10-CM | POA: Diagnosis not present

## 2023-01-15 NOTE — Progress Notes (Signed)
Subjective:    Patient ID: [name], female    DOB: [birth year], 67 y.o.   MRN: [id number]  HPI   HPI  [name] is a 67 y.o. year old female  who  has a past medical history of Allergy, Arthritis, Atypical mole (06/05/2003), Atypical nevus (07/26/2003), Basal cell carcinoma (06/05/2003), Basal cell carcinoma (05/22/2004), Basal cell carcinoma (11/27/2004), Basal cell carcinoma (11/08/2009), Basal cell carcinoma (05/24/2012), Basal cell carcinoma (06/24/2012), Basal cell carcinoma (07/29/2012), Basal cell carcinoma (06/06/2019), Cataract, Closed compression fracture of L5 vertebra (HCC), Constipation, COPD (chronic obstructive pulmonary disease) (HCC), Coronary artery disease, COVID, Depression, GERD (gastroesophageal reflux disease), Heart murmur, adenomatous polyp of colon (10/03/2014), Hypercholesteremia, Hypertension, Panic attacks, Perimenopausal, Skin cancer, Squamous cell carcinoma of skin (06/05/2003), Squamous cell carcinoma of skin (05/22/2004), Squamous cell carcinoma of skin (11/27/2004), Squamous cell carcinoma of skin (03/04/2006), Squamous cell carcinoma of skin (11/26/2006), Squamous cell carcinoma of skin (05/24/2012), Squamous cell carcinoma of skin (04/05/2013), Squamous cell carcinoma of skin (08/17/2013), Squamous cell carcinoma of skin (07/30/2016), Squamous cell carcinoma of skin (05/12/2017), Squamous cell carcinoma of skin (11/23/2018), and Tobacco user.   They are presenting to PM&R clinic as a new patient for pain management evaluation. They were referred by Dr. Nelly Laurence for treatment of lumbar herniated disc pain.   Patient reports that her issues with chronic pain began about 15 years ago in her lower back.  She reports she was followed by a orthopedic doctor who was prescribing Vicodin 10 mg at that time.  She says this caused her to itch.    Patient reports she had a fracture of her spine, compression fracture?  And ribs after an injury moving her  sister-in-law after the sister-in-law had a fall.  Patient says orthopedic doctor was no longer able to prescribe her medication and referred her to a pain clinic (she does not recall the name of this clinic and says it is not [facility]).  She reports she was getting shots in her back that helped for few weeks at a time.  She says she is getting oxycodone 10 mg for her pain.  Patient reports that when her primary provider was away, a different provider in the same practice ordered her current medication.  Patient states that she was unable to take seeing this practice because she received medicines from 2 different providers (she says both were in the same practice?)  After this patient was followed by Dr. Leonard Schwartz. [place].  Patient reports she was getting injections in multiple areas of her body trigger point injections?  It also sounds like she had an ESI injection completed.  Patient reports that she was no longer able to get medication from Dr. Leonard Schwartz. [place] after a UDS showed fentanyl a few weeks later.  She then had a UDS that was positive at her PCP office for fentanyl.  Patient was patient reports that her first son was arrested by police officers for drugs.  Patient says she was cleaning up his trailer and she thinks she may have been exposed to fentanyl.  She also thinks there may have been exposure from her son's girlfriend who is now in prison.  She continues to have pain in her lower back worsened with activities.  She reports pain in her low back will radiate down to her bilateral feet.  Reports she also reports pain in her right shoulder due to a tendon tear, biceps?  Rotator cuff left shoulder.  She also  has pain in her neck that is chronic and severe.  Since oxycodone has been discontinued she reports pain has been very severe and limiting her activities.  She reports she cannot sleep due to the pain.   Red flag symptoms: No red flags for back pain endorsed in Hx or  ROS  Patient is a limited historian  Medications tried: Nsaids doesn't help-ibuprofen Tylenol doesn't help Opiates  Hydrocodone- allergey Oxycodone- helped her pain  Gabapentin- minimal benefit  TCAs  -denies SNRIs - takes cymbalta for depression    Other treatments: PT- few years , helped at the time   TENs unit - denies  Injections - lower back and neck injections, helped for a few weeks L5-S1 interlaminar Epidural Steroid Injection Dr. Corky Crafts   Trigger point injections Dr. Corky Crafts   Surgery - denies     Prior UDS results: No results found for: "LABOPIA", "COCAINSCRNUR", "LABBENZ", "AMPHETMU", "THCU", "LABBARB"    Pain Inventory Average Pain 7 Pain Right Now 6 My pain is sharp and aching  In the last 24 hours, has pain interfered with the following? General activity 8 Relation with others 7 Enjoyment of life 8 What TIME of day is your pain at its worst? morning  and evening Sleep (in general) Poor  Pain is worse with: walking, bending, standing, and some activites Pain improves with: medication and injections Relief from Meds: 8  walk without assistance how many minutes can you walk? 10 ability to climb steps?  no do you drive?  yes Do you have any goals in this area?  yes  not employed: date last employed . disabled: date disabled 2013 I need assistance with the following:  household duties and shopping  bowel control problems tremor trouble walking spasms dizziness depression anxiety loss of taste or smell  Any changes since last visit?  no  Any changes since last visit?  no    Family History  Problem Relation Age of Onset  . Heart disease Mother   . Heart attack Mother   . Heart attack Father   . Colon polyps Sister   . Esophageal cancer Sister   . Anxiety disorder Sister   . Depression Sister   . Colon polyps Sister   . Heart attack Brother   . Colon cancer Brother 49       died at age 3  . Anxiety disorder  Brother   . Depression Brother   . Alcohol abuse Brother   . Colon polyps Brother   . Colon polyps Brother   . Drug abuse Son   . Breast cancer Niece   . Rectal cancer Neg Hx   . Stomach cancer Neg Hx    Social History   Socioeconomic History  . Marital status: Single    Spouse name: Not on file  . Number of children: 2  . Years of education: Not on file  . Highest education level: Not on file  Occupational History  . 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 Smoking status: Every Day    Current packs/day: 1.00    Average packs/day: 1 pack/day for 53.7 years (53.7 ttl pk-yrs)    Types: Cigarettes    Start date: 05/01/1969  . Smokeless tobacco: Never  Vaping Use  . Vaping status: Never Used  Substance and Sexual Activity  . Alcohol use: No    Alcohol/week: 0.0 standard drinks of alcohol  . Drug use: No  Comment: per pt, Cocaine was in her system October and November 2016 when she went to the pain clinic 04-02-15.  Marland Kitchen Sexual activity: Not on file  Other Topics Concern  . Not on file  Social History Narrative   Retired/ lives alone in one level home   Son and daughter in law that live next door and help her out a lot         Social Determinants of Health   Financial Resource Strain: Medium Risk (04/16/2022)   Overall Financial Resource Strain (CARDIA)   . Difficulty of Paying Living Expenses: Somewhat hard  Food Insecurity: No Food Insecurity (04/16/2022)   Hunger Vital Sign   . Worried About Programme researcher, broadcasting/film/video in the Last Year: Never true   . Ran Out of Food in the Last Year: Never true  Transportation Needs: No Transportation Needs (04/16/2022)   PRAPARE - Transportation   . Lack of Transportation (Medical): No   . Lack of Transportation (Non-Medical): No  Physical Activity: Inactive (04/16/2022)   Exercise Vital Sign   . Days of Exercise per Week: 0 days   . Minutes of Exercise per Session: 0 min  Stress: Stress Concern  Present (04/16/2022)   [employer] of Occupational Health - Occupational Stress Questionnaire   . Feeling of Stress : To some extent  Social Connections: Socially Isolated (04/16/2022)   Social Connection and Isolation Panel [NHANES]   . Frequency of Communication with Friends and Family: More than three times a week   . Frequency of Social Gatherings with Friends and Family: More than three times a week   . Attends Religious Services: Never   . Active Member of Clubs or Organizations: No   . Attends Banker Meetings: Never   . Marital Status: Divorced   Past Surgical History:  Procedure Laterality Date  . ABDOMINAL HYSTERECTOMY    . CATARACT EXTRACTION W/PHACO Right 03/01/2021   Procedure: CATARACT EXTRACTION PHACO AND INTRAOCULAR LENS PLACEMENT (IOC);  Surgeon: Fabio Pierce, MD;  Location: [facility];  Service: Ophthalmology;  Laterality: Right;  CDE: 7.31  . CATARACT EXTRACTION W/PHACO Left 05/16/2021   Procedure: CATARACT EXTRACTION PHACO AND INTRAOCULAR LENS PLACEMENT (IOC);  Surgeon: Fabio Pierce, MD;  Location: [facility];  Service: Ophthalmology;  Laterality: Left;  CDE 9.41   . COLONOSCOPY    . IR VERTEBROPLASTY CERV/THOR BX INC UNI/BIL INC/INJECT/IMAGING  04/16/2021  . Right foot toe surgery    . UPPER GASTROINTESTINAL ENDOSCOPY    . vocal cord     polyp removal   Past Medical History:  Diagnosis Date  . Allergy   . Arthritis   . Atypical mole 06/05/2003   slight-mod-Left scapula (WS)  . Atypical nevus 07/26/2003   persist. dysp- Left scapula (WS)  . Basal cell carcinoma 06/05/2003   RIght chest (CX35FU)  . Basal cell carcinoma 05/22/2004   superificial- RIght upper back- (CX35FU)  . Basal cell carcinoma 11/27/2004   beside R nostril-(MOHS), below right outer nose (MOHS)  . Basal cell carcinoma 11/08/2009   left post shoulder -(txpbx)  . Basal cell carcinoma 05/24/2012   ulcerated- Left post shoulder- (CX35FU), ulcerated-mid forehead (EXC )  . Basal  cell carcinoma 06/24/2012   superificial- Right shoulder - (txpbx)   . Basal cell carcinoma 07/29/2012    sclerosis- mid forehead (MOHS)  . Basal cell carcinoma 06/06/2019   nod-right anterior neck (CX35FU)  . Cataract   . Closed compression fracture of L5 vertebra (HCC)   .  Constipation    uses OTC meds with help  . COPD (chronic obstructive pulmonary disease) (HCC)   . Coronary artery disease    minimal nonobstructive   . COVID   . Depression   . GERD (gastroesophageal reflux disease)   . Heart murmur   . Hx of adenomatous polyp of colon 10/03/2014  . Hypercholesteremia   . Hypertension   . Panic attacks   . Perimenopausal   . Skin cancer   . Squamous cell carcinoma of skin 06/05/2003   Right [place](CX35FU)  . Squamous cell carcinoma of skin 05/22/2004   in situ- Left upperarm (CX35FU)  . Squamous cell carcinoma of skin 11/27/2004   in situ- above Left elbow (CX35FU)  . Squamous cell carcinoma of skin 03/04/2006   in situ- Left sideburn (CX35FU)  . Squamous cell carcinoma of skin 11/26/2006   in situ- mid brow (Cx35FU)  . Squamous cell carcinoma of skin 05/24/2012   in situ- Left chest- (CX35FU)  . Squamous cell carcinoma of skin 04/05/2013   in situ- Left nose (Cx35FU)  . Squamous cell carcinoma of skin 08/17/2013   well diff-above Left eye (txpbx)  . Squamous cell carcinoma of skin 07/30/2016   in situ- right chest sup (Cx35FU)  . Squamous cell carcinoma of skin 05/12/2017   in situ- RIght shoulder (CX35FU), in situ- Left forehead (CX35FU)  . Squamous cell carcinoma of skin 11/23/2018   in situ- Right mid shin, inner- (MOHS)  . Tobacco user    BP (!) 144/88   Pulse 84   Ht 5\' 5"  (1.651 m)   Wt 140 lb (63.5 kg)   SpO2 95%   BMI 23.30 kg/m   Opioid Risk Score:   Fall Risk Score:  `1  Depression screen PHQ 2/9     01/15/2023    1:25 PM 12/17/2022    2:03 PM 12/17/2022    1:50 PM 08/20/2022   11:29 AM 08/20/2022   11:21 AM 06/17/2022    1:15 PM  06/17/2022    1:07 PM  Depression screen PHQ 2/9  Decreased Interest 3 2 0 2 0 1 0  Down, Depressed, Hopeless 1 1 0 2 0 1 0  PHQ - 2 Score 4 3 0 4 0 2 0  Altered sleeping 2 2  1  1    Tired, decreased energy 3 3  2  1    Change in appetite 2 3  2   0   Feeling bad or failure about yourself  0 1  1  0   Trouble concentrating 1 2  2   0   Moving slowly or fidgety/restless 0 2  2  0   Suicidal thoughts 0 0  0  0   PHQ-9 Score 12 16  14  4    Difficult doing work/chores Very difficult Very difficult  Very difficult  Very difficult       Review of Systems  Musculoskeletal:  Positive for back pain, gait problem and neck pain.       B/L shoulder pain R Hip and leg pain   All other systems reviewed and are negative.     Objective:   Physical Exam  Gen: no distress, normal appearing HEENT: oral mucosa pink and moist, NCAT Cardio: Reg rate Chest: normal effort, normal rate of breathing Abd: soft, non-distended Ext: no edema Psych: pleasant, normal affect Skin: intact Neuro: Alert and awake, follows commands, cranial nerves II through XII grossly intact Strength 4-4+ out of 5 throughout bilateral upper  and lower extremities DTR normal and symmetric No ankle clonus noted Sensation intact light touch in all 4 extremities Musculoskeletal:  Neer's and Hawking's test positive on the left, negative on the right Pain with palpation of bilateral shoulders and with passive and active range of motion SLR positive on the right, negative on the left FABER and FADIR resulted in generalized back pain  L spine MRI 08/04/22 Multiplanar, multisequence MR imaging of the lumbar spine was  performed. No intravenous contrast was administered.   COMPARISON:  03/09/2020   FINDINGS:  Segmentation: Standard.   Alignment:  Minimal grade 1 anterolisthesis of T12 on L1.   Vertebrae: No acute fracture, evidence of discitis, or aggressive  bone lesion.   Conus medullaris and cauda equina: Conus extends  to the T12-L1  level. Conus and cauda equina appear normal.   Paraspinal and other soft tissues: No acute paraspinal abnormality.   Disc levels:   Disc spaces: Degenerative disease with mild disc height loss at  T11-12, T12-L1, L1-2, L4-5 and L5-S1.   T11-12: Mild broad-based disc bulge. No foraminal or central canal  stenosis.   T12-L1: Mild broad-based disc bulge. No foraminal or central canal  stenosis.   L1-L2: Mild broad-based disc bulge. Mild bilateral facet  arthropathy. Mild spinal stenosis. No foraminal stenosis.   L2-L3: Broad-based disc bulge. Mild bilateral facet arthropathy.  Mild spinal stenosis. Bilateral lateral recess stenosis. Mild  bilateral foraminal stenosis.   L3-L4: Broad-based disc bulge. Moderate bilateral facet arthropathy.  Mild spinal stenosis. Bilateral lateral recess stenosis. Mild  bilateral foraminal stenosis.   L4-L5: Broad-based disc bulge. Moderate bilateral facet arthropathy  with ligamentum flavum infolding. Severe spinal stenosis. Severe  left foraminal stenosis. Moderate right foraminal stenosis.   L5-S1: Mild broad-based disc bulge. Mild bilateral facet  arthropathy. Mild right foraminal stenosis. Moderate-severe left  foraminal stenosis. No spinal stenosis.   IMPRESSION:  1. Diffuse lumbar spine spondylosis as described above, mildly  progressed compared with the prior examination particularly at L4-5.  2. No acute osseous injury of the lumbar spine.   T spine CT 03/26/21  RADIATION DOSE REDUCTION: This exam was performed according to the  departmental dose-optimization program which includes automated  exposure control, adjustment of the mA and/or kV according to  patient size and/or use of iterative reconstruction technique.   COMPARISON:  X-ray earlier the same day   FINDINGS:  Alignment: No subluxation.   Vertebrae: Anterior compression fracture deformity T5 with  approximately 40% height loss. Increased sclerosis  especially at the  inferior endplate. No retropulsed bony fragments visualized.  Remaining vertebral bodies appear intact. No suspicious bony lesions  identified.   Paraspinal and other soft tissues: No acute process identified.   Disc levels: Mild intervertebral disc space narrowing throughout the  midthoracic spine and mild facet arthropathy.   MRI C spine 03/09/20 MRI CERVICAL SPINE FINDINGS   Alignment: Straightening of lordosis.   Vertebrae: Normal bone marrow signal intensity. No focal osseous  lesion.   Cord: Normal signal and morphology.   Posterior Fossa, vertebral arteries: Negative.   Disc levels: Multilevel desiccation.   C2-3: No significant disc bulge. Patent spinal canal and neural  foramen.   C3-4: Shallow central protrusion with uncovertebral and facet  degenerative spurring. Patent spinal canal and left neural foramen.  Mild right neural foraminal narrowing.   C4-5: Shallow central protrusion. Bilateral facet degenerative  spurring. Patent spinal canal and neural foramen.   C5-6: Disc osteophyte complex with central bulge abutting the  ventral cord. Superimposed minimally inferiorly migrated left  subarticular extrusion (9:9). Uncovertebral and facet degenerative  spurring. Mild spinal canal and bilateral neural foraminal  narrowing.   C6-7: Disc osteophyte complex with uncovertebral and facet  hypertrophy. Patent spinal canal. Mild bilateral neural foraminal  narrowing.   C7-T1: Shallow central protrusion with left predominant  uncovertebral and facet hypertrophy. Mild spinal canal and bilateral  neural foraminal narrowing.     MRI cervical spine:   Mild spinal canal and bilateral neural foraminal narrowing at the  C5-6 and C7-T1 levels.   Mild bilateral C6-7 and right C3-4 neural foraminal narrowing.    MRI L spine 03/09/20 MRI lumbar spine:   Multilevel spondylosis, grossly unchanged.   Moderate spinal canal and moderate to severe neural  foraminal  narrowing at the L4-5 level.   Moderate bilateral L3-4 neural foraminal narrowing.      Assessment & Plan:  1) Chronic lower back pain with bilateral sciatica  -L-spine with diffuse multilevel spondylosis, severe spinal stenosis L4-5.  She also has T-spine spondylosis 2) Chronic neck pain  -C-spine MRI with multilevel lumbar spondylosis 3) Bilateral shoulder pain  -Patient reports history of a rotator cuff tear on the left side, biceps tendon?  tear on the right side 4) Hx Moderate B/L Carpal Tunnel syndrome- seen by [facility] previously, S/p L carpal tunnel injection. Reports symptoms LUE > RUE 5)Bipolar Disorder. Caution with antidepressant class medications   -Would like to see records from her prior pain management doctor -Will hold off on ordering TENS, due to history of multiple skin cancers -Will check UDS today, discussed not guarantee that controlled pain medications will be prescribed Continue wearing splint at night L wrist  -Opiate risk tool 4 moderate, if fentanyl was not considered intentional use

## 2023-01-20 ENCOUNTER — Other Ambulatory Visit: Payer: Self-pay | Admitting: Family Medicine

## 2023-01-20 LAB — TOXASSURE SELECT,+ANTIDEPR,UR

## 2023-01-22 DIAGNOSIS — M6281 Muscle weakness (generalized): Secondary | ICD-10-CM | POA: Diagnosis not present

## 2023-01-23 ENCOUNTER — Other Ambulatory Visit (HOSPITAL_COMMUNITY): Payer: Self-pay | Admitting: Psychiatry

## 2023-01-23 DIAGNOSIS — F332 Major depressive disorder, recurrent severe without psychotic features: Secondary | ICD-10-CM

## 2023-02-04 ENCOUNTER — Other Ambulatory Visit: Payer: Self-pay | Admitting: Family Medicine

## 2023-02-04 DIAGNOSIS — J439 Emphysema, unspecified: Secondary | ICD-10-CM

## 2023-02-12 ENCOUNTER — Encounter: Payer: 59 | Attending: Physical Medicine & Rehabilitation | Admitting: Physical Medicine & Rehabilitation

## 2023-02-12 ENCOUNTER — Encounter: Payer: Self-pay | Admitting: Physical Medicine & Rehabilitation

## 2023-02-12 VITALS — BP 126/82 | HR 84 | Wt 139.0 lb

## 2023-02-12 DIAGNOSIS — G894 Chronic pain syndrome: Secondary | ICD-10-CM | POA: Insufficient documentation

## 2023-02-12 DIAGNOSIS — M5442 Lumbago with sciatica, left side: Secondary | ICD-10-CM | POA: Diagnosis not present

## 2023-02-12 DIAGNOSIS — M542 Cervicalgia: Secondary | ICD-10-CM | POA: Diagnosis not present

## 2023-02-12 DIAGNOSIS — G8929 Other chronic pain: Secondary | ICD-10-CM | POA: Diagnosis not present

## 2023-02-12 DIAGNOSIS — M5441 Lumbago with sciatica, right side: Secondary | ICD-10-CM | POA: Insufficient documentation

## 2023-02-12 DIAGNOSIS — Z79891 Long term (current) use of opiate analgesic: Secondary | ICD-10-CM | POA: Insufficient documentation

## 2023-02-12 MED ORDER — OXYCODONE-ACETAMINOPHEN 2.5-325 MG PO TABS: 1.0000 | ORAL_TABLET | Freq: Two times a day (BID) | ORAL | 0 refills | Status: DC | PRN

## 2023-02-12 NOTE — Progress Notes (Signed)
Subjective:    Patient ID: [name], female    DOB: 15-May-1955, 67 y.o.   MRN: [id number]  HPI   HPI  [name] is a 67 y.o. year old female  who  has a past medical history of Allergy, Arthritis, Atypical mole (06/05/2003), Atypical nevus (07/26/2003), Basal cell carcinoma (06/05/2003), Basal cell carcinoma (05/22/2004), Basal cell carcinoma (11/27/2004), Basal cell carcinoma (11/08/2009), Basal cell carcinoma (05/24/2012), Basal cell carcinoma (06/24/2012), Basal cell carcinoma (07/29/2012), Basal cell carcinoma (06/06/2019), Cataract, Closed compression fracture of L5 vertebra (HCC), Constipation, COPD (chronic obstructive pulmonary disease) (HCC), Coronary artery disease, COVID, Depression, GERD (gastroesophageal reflux disease), Heart murmur, adenomatous polyp of colon (10/03/2014), Hypercholesteremia, Hypertension, Panic attacks, Perimenopausal, Skin cancer, Squamous cell carcinoma of skin (06/05/2003), Squamous cell carcinoma of skin (05/22/2004), Squamous cell carcinoma of skin (11/27/2004), Squamous cell carcinoma of skin (03/04/2006), Squamous cell carcinoma of skin (11/26/2006), Squamous cell carcinoma of skin (05/24/2012), Squamous cell carcinoma of skin (04/05/2013), Squamous cell carcinoma of skin (08/17/2013), Squamous cell carcinoma of skin (07/30/2016), Squamous cell carcinoma of skin (05/12/2017), Squamous cell carcinoma of skin (11/23/2018), and Tobacco user.   They are presenting to PM&R clinic as a new patient for pain management evaluation. They were referred by Dr. Nelly Laurence for treatment of lumbar herniated disc pain.   Patient reports that her issues with chronic pain began about 15 years ago in her lower back.  She reports she was followed by a orthopedic doctor who was prescribing Vicodin 10 mg at that time.  She says this caused her to itch.    Patient reports she had a fracture of her spine, compression fracture?  And ribs after an injury moving her  sister-in-law after the sister-in-law had a fall.  Patient says orthopedic doctor was no longer able to prescribe her medication and referred her to a pain clinic (she does not recall the name of this clinic and says it is not [facility]).  She reports she was getting shots in her back that helped for few weeks at a time.  She says she is getting oxycodone 10 mg for her pain.  Patient reports that when her primary provider was away, a different provider in the same practice ordered her current medication.  Patient states that she was unable to take seeing this practice because she received medicines from 2 different providers (she says both were in the same practice?)  After this patient was followed by Dr. Leonard Schwartz. [place].  Patient reports she was getting injections in multiple areas of her body trigger point injections?  It also sounds like she had an ESI injection completed.  Patient reports that she was no longer able to get medication from Dr. Leonard Schwartz. [place] after a UDS showed fentanyl a few weeks later.  She then had a UDS that was positive at her PCP office for fentanyl.  Patient was patient reports that her first son was arrested by police officers for drugs.  Patient says she was cleaning up his trailer and she thinks she may have been exposed to fentanyl.  She also thinks there may have been exposure from her son's girlfriend who is now in prison.  She continues to have pain in her lower back worsened with activities.  She reports pain in her low back will radiate down to her bilateral feet.  Reports she also reports pain in her right shoulder due to a tendon tear, biceps?  Rotator cuff left shoulder.  She also  has pain in her neck that is chronic and severe.  Since oxycodone has been discontinued she reports pain has been very severe and limiting her activities.  She reports she cannot sleep due to the pain.   Red flag symptoms: No red flags for back pain endorsed in Hx or  ROS   Medications tried: Nsaids doesn't help-ibuprofen Tylenol doesn't help Opiates  Hydrocodone- allergey Oxycodone- helped her pain  Gabapentin- minimal benefit  Tramadol didn't help  TCAs  -denies SNRIs - takes cymbalta for depression    Other treatments: PT- few years , helped at the time   TENs unit - denies  Injections - lower back and neck injections, helped for a few weeks L5-S1 interlaminar Epidural Steroid Injection Dr. Corky Crafts   Trigger point injections Dr. Corky Crafts   Surgery - denies     Prior UDS results: No results found for: "LABOPIA", "COCAINSCRNUR", "LABBENZ", "AMPHETMU", "THCU", "LABBARB"    Interval History 02/12/23  Patient is here for follow-up regarding her chronic back, neck and shoulder pain.  Reports pain has been very severe recently particularly in her back neck and left shoulder.  Due to the pain, she feels that her activity tolerance is very limited.  She reports she was able to be more active in and out of the home when she was using opioid medications.   Pain Inventory Average Pain 5 Pain Right Now 6 My pain is dull, tingling, and aching  In the last 24 hours, has pain interfered with the following? General activity 7 Relation with others 8 Enjoyment of life 8 What TIME of day is your pain at its worst? morning  and night Sleep (in general) Fair  Pain is worse with: walking, bending, sitting, standing, and some activites Pain improves with: medication Relief from Meds: 2  walk without assistance how many minutes can you walk? 10 ability to climb steps?  no do you drive?  yes Do you have any goals in this area?  yes  not employed: date last employed . disabled: date disabled 2013 I need assistance with the following:  household duties and shopping  bowel control problems tremor trouble walking spasms dizziness depression anxiety loss of taste or smell  Any changes since last visit?  no  Any changes  since last visit?  no    Family History  Problem Relation Age of Onset   Heart disease Mother    Heart attack Mother    Heart attack Father    Colon polyps Sister    Esophageal cancer Sister    Anxiety disorder Sister    Depression Sister    Colon polyps Sister    Heart attack Brother    Colon cancer Brother 78       died at age 67   Anxiety disorder Brother    Depression Brother    Alcohol abuse Brother    Colon polyps Brother    Colon polyps Brother    Drug abuse Son    Breast cancer Niece    Rectal cancer Neg Hx    Stomach cancer Neg Hx    Social History   Socioeconomic History   Marital status: Single    Spouse name: Not on file   Number of children: 2   Years of education: Not on file   Highest education level: Not on file  Occupational History   Occupation: Conservation officer, 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   Sexual activity: Not on file  Other Topics Concern   Not on file  Social History Narrative   Retired/ lives alone in one level home   Son and daughter in law that live next door and help her out a lot         Social Drivers of Health   Financial Resource Strain: Medium Risk (04/16/2022)   Overall Financial Resource Strain (CARDIA)    Difficulty of Paying Living Expenses: Somewhat hard  Food Insecurity: No Food Insecurity (04/16/2022)   Hunger Vital Sign    Worried About Running Out of Food in the Last Year: Never true    Ran Out of Food in the Last Year: Never true   Transportation Needs: No Transportation Needs (04/16/2022)   PRAPARE - Administrator, Civil Service (Medical): No    Lack of Transportation (Non-Medical): No  Physical Activity: Inactive (04/16/2022)   Exercise Vital Sign    Days of Exercise per Week: 0 days    Minutes of Exercise per Session: 0 min  Stress: Stress Concern Present (04/16/2022)   [employer] of Occupational Health - Occupational Stress Questionnaire    Feeling of Stress : To some extent  Social Connections: Socially Isolated (04/16/2022)   Social Connection and Isolation Panel [NHANES]    Frequency of Communication with Friends and Family: More than three times a week    Frequency of Social Gatherings with Friends and Family: More than three times a week    Attends Religious Services: Never    Database administrator or Organizations: No    Attends Engineer, structural: Never    Marital Status: Divorced   Past Surgical History:  Procedure Laterality Date   ABDOMINAL HYSTERECTOMY     CATARACT EXTRACTION W/PHACO Right 03/01/2021   Procedure: CATARACT EXTRACTION PHACO AND INTRAOCULAR LENS PLACEMENT (IOC);  Surgeon: Fabio Pierce, MD;  Location: [facility];  Service: Ophthalmology;  Laterality: Right;  CDE: 7.31   CATARACT EXTRACTION W/PHACO Left 05/16/2021   Procedure: CATARACT EXTRACTION PHACO AND INTRAOCULAR LENS PLACEMENT (IOC);  Surgeon: Fabio Pierce, MD;  Location: [facility];  Service: Ophthalmology;  Laterality: Left;  CDE 9.41    COLONOSCOPY     IR VERTEBROPLASTY CERV/THOR BX INC UNI/BIL INC/INJECT/IMAGING  04/16/2021   Right foot toe surgery     UPPER GASTROINTESTINAL ENDOSCOPY     vocal cord     polyp removal   Past Medical History:  Diagnosis Date   Allergy    Arthritis    Atypical mole 06/05/2003   slight-mod-Left scapula (WS)   Atypical nevus 07/26/2003   persist. dysp- Left scapula (WS)   Basal cell carcinoma 06/05/2003   RIght chest (CX35FU)   Basal cell carcinoma 05/22/2004    superificial- RIght upper back- (CX35FU)   Basal cell carcinoma 11/27/2004   beside R nostril-(MOHS), below right outer nose (MOHS)   Basal cell carcinoma 11/08/2009   left post shoulder -(txpbx)  Basal cell carcinoma 05/24/2012   ulcerated- Left post shoulder- (CX35FU), ulcerated-mid forehead (EXC )   Basal cell carcinoma 06/24/2012   superificial- Right shoulder - (txpbx)    Basal cell carcinoma 07/29/2012    sclerosis- mid forehead (MOHS)   Basal cell carcinoma 06/06/2019   nod-right anterior neck (CX35FU)   Cataract    Closed compression fracture of L5 vertebra (HCC)    Constipation    uses OTC meds with help   COPD (chronic obstructive pulmonary disease) (HCC)    Coronary artery disease    minimal nonobstructive    COVID    Depression    GERD (gastroesophageal reflux disease)    Heart murmur    Hx of adenomatous polyp of colon 10/03/2014   Hypercholesteremia    Hypertension    Panic attacks    Perimenopausal    Skin cancer    Squamous cell carcinoma of skin 06/05/2003   Right [place](CX35FU)   Squamous cell carcinoma of skin 05/22/2004   in situ- Left upperarm (CX35FU)   Squamous cell carcinoma of skin 11/27/2004   in situ- above Left elbow (CX35FU)   Squamous cell carcinoma of skin 03/04/2006   in situ- Left sideburn (CX35FU)   Squamous cell carcinoma of skin 11/26/2006   in situ- mid brow (Cx35FU)   Squamous cell carcinoma of skin 05/24/2012   in situ- Left chest- (CX35FU)   Squamous cell carcinoma of skin 04/05/2013   in situ- Left nose (Cx35FU)   Squamous cell carcinoma of skin 08/17/2013   well diff-above Left eye (txpbx)   Squamous cell carcinoma of skin 07/30/2016   in situ- right chest sup (Cx35FU)   Squamous cell carcinoma of skin 05/12/2017   in situ- RIght shoulder (CX35FU), in situ- Left forehead (CX35FU)   Squamous cell carcinoma of skin 11/23/2018   in situ- Right mid shin, inner- (MOHS)   Tobacco user    There were no vitals taken for this  visit.  Opioid Risk Score:   Fall Risk Score:  `1  Depression screen PHQ 2/9     01/15/2023    1:25 PM 12/17/2022    2:03 PM 12/17/2022    1:50 PM 08/20/2022   11:29 AM 08/20/2022   11:21 AM 06/17/2022    1:15 PM 06/17/2022    1:07 PM  Depression screen PHQ 2/9  Decreased Interest 3 2 0 2 0 1 0  Down, Depressed, Hopeless 1 1 0 2 0 1 0  PHQ - 2 Score 4 3 0 4 0 2 0  Altered sleeping 2 2  1  1    Tired, decreased energy 3 3  2  1    Change in appetite 2 3  2   0   Feeling bad or failure about yourself  0 1  1  0   Trouble concentrating 1 2  2   0   Moving slowly or fidgety/restless 0 2  2  0   Suicidal thoughts 0 0  0  0   PHQ-9 Score 12 16  14  4    Difficult doing work/chores Very difficult Very difficult  Very difficult  Very difficult       Review of Systems  Musculoskeletal:  Positive for back pain, gait problem and neck pain.       B/L shoulder pain R Hip and leg pain   All other systems reviewed and are negative.      Objective:   Physical Exam  Gen: no distress, normal appearing HEENT: oral  mucosa pink and moist, NCAT Cardio: Reg rate Chest: normal effort, normal rate of breathing Abd: soft, non-distended Ext: no edema Psych: pleasant, normal affect Skin: intact Neuro: Alert and awake, follows commands, cranial nerves II through XII grossly intact Strength 4-4+ out of 5 throughout bilateral upper and lower extremities Sensation intact light touch in all 4 extremities Musculoskeletal:  Pain with palpation of bilateral shoulders left greater than right with passive and active range of motion Slump test positive on the right, negative on the left + upper L spine paraspinal TTP + mild C spine and T spine paraspinal TTP  L spine MRI 08/04/22 Multiplanar, multisequence MR imaging of the lumbar spine was  performed. No intravenous contrast was administered.   COMPARISON:  03/09/2020   FINDINGS:  Segmentation: Standard.   Alignment:  Minimal grade 1  anterolisthesis of T12 on L1.   Vertebrae: No acute fracture, evidence of discitis, or aggressive  bone lesion.   Conus medullaris and cauda equina: Conus extends to the T12-L1  level. Conus and cauda equina appear normal.   Paraspinal and other soft tissues: No acute paraspinal abnormality.   Disc levels:   Disc spaces: Degenerative disease with mild disc height loss at  T11-12, T12-L1, L1-2, L4-5 and L5-S1.   T11-12: Mild broad-based disc bulge. No foraminal or central canal  stenosis.   T12-L1: Mild broad-based disc bulge. No foraminal or central canal  stenosis.   L1-L2: Mild broad-based disc bulge. Mild bilateral facet  arthropathy. Mild spinal stenosis. No foraminal stenosis.   L2-L3: Broad-based disc bulge. Mild bilateral facet arthropathy.  Mild spinal stenosis. Bilateral lateral recess stenosis. Mild  bilateral foraminal stenosis.   L3-L4: Broad-based disc bulge. Moderate bilateral facet arthropathy.  Mild spinal stenosis. Bilateral lateral recess stenosis. Mild  bilateral foraminal stenosis.   L4-L5: Broad-based disc bulge. Moderate bilateral facet arthropathy  with ligamentum flavum infolding. Severe spinal stenosis. Severe  left foraminal stenosis. Moderate right foraminal stenosis.   L5-S1: Mild broad-based disc bulge. Mild bilateral facet  arthropathy. Mild right foraminal stenosis. Moderate-severe left  foraminal stenosis. No spinal stenosis.   IMPRESSION:  1. Diffuse lumbar spine spondylosis as described above, mildly  progressed compared with the prior examination particularly at L4-5.  2. No acute osseous injury of the lumbar spine.   T spine CT 03/26/21  RADIATION DOSE REDUCTION: This exam was performed according to the  departmental dose-optimization program which includes automated  exposure control, adjustment of the mA and/or kV according to  patient size and/or use of iterative reconstruction technique.   COMPARISON:  X-ray earlier the same  day   FINDINGS:  Alignment: No subluxation.   Vertebrae: Anterior compression fracture deformity T5 with  approximately 40% height loss. Increased sclerosis especially at the  inferior endplate. No retropulsed bony fragments visualized.  Remaining vertebral bodies appear intact. No suspicious bony lesions  identified.   Paraspinal and other soft tissues: No acute process identified.   Disc levels: Mild intervertebral disc space narrowing throughout the  midthoracic spine and mild facet arthropathy.   MRI C spine 03/09/20 MRI CERVICAL SPINE FINDINGS   Alignment: Straightening of lordosis.   Vertebrae: Normal bone marrow signal intensity. No focal osseous  lesion.   Cord: Normal signal and morphology.   Posterior Fossa, vertebral arteries: Negative.   Disc levels: Multilevel desiccation.   C2-3: No significant disc bulge. Patent spinal canal and neural  foramen.   C3-4: Shallow central protrusion with uncovertebral and facet  degenerative spurring. Patent spinal canal  and left neural foramen.  Mild right neural foraminal narrowing.   C4-5: Shallow central protrusion. Bilateral facet degenerative  spurring. Patent spinal canal and neural foramen.   C5-6: Disc osteophyte complex with central bulge abutting the  ventral cord. Superimposed minimally inferiorly migrated left  subarticular extrusion (9:9). Uncovertebral and facet degenerative  spurring. Mild spinal canal and bilateral neural foraminal  narrowing.   C6-7: Disc osteophyte complex with uncovertebral and facet  hypertrophy. Patent spinal canal. Mild bilateral neural foraminal  narrowing.   C7-T1: Shallow central protrusion with left predominant  uncovertebral and facet hypertrophy. Mild spinal canal and bilateral  neural foraminal narrowing.     MRI cervical spine:   Mild spinal canal and bilateral neural foraminal narrowing at the  C5-6 and C7-T1 levels.   Mild bilateral C6-7 and right C3-4 neural  foraminal narrowing.    MRI L spine 03/09/20 MRI lumbar spine:   Multilevel spondylosis, grossly unchanged.   Moderate spinal canal and moderate to severe neural foraminal  narrowing at the L4-5 level.   Moderate bilateral L3-4 neural foraminal narrowing.      Assessment & Plan:  1) Chronic lower back pain with bilateral sciatica  -L-spine with diffuse multilevel spondylosis, severe spinal stenosis L4-5.  She also has T-spine spondylosis 2) Chronic neck pain  -C-spine MRI with multilevel lumbar spondylosis 3) Bilateral shoulder pain  -Patient reports history of a rotator cuff tear on the left side, biceps tendon?  tear on the right side 4) Hx Moderate B/L Carpal Tunnel syndrome- seen by [facility] previously, S/p L carpal tunnel injection. Reports symptoms LUE > RUE 5)Bipolar Disorder. Caution with antidepressant class medications   -Would like to see records from her prior pain management doctor -Will hold off on ordering TENS, due to history of multiple skin cancers -UDS consistent last visit -Due to fentanyl on prior UDS- higher risk for opiod use. Limit to 10 morphine equivalents by [facility] policy  -Start percocet 2.5mg  BID prn -Continue wearing splint at night L wrist  -Pt would like 2nd option on pain medication treatment, will refer to [facility]

## 2023-02-17 ENCOUNTER — Telehealth: Payer: Self-pay

## 2023-02-17 NOTE — Telephone Encounter (Signed)
(  KeyPrincella Pellegrini [id number]) PA Case ID #: [id number] Oxycodone/APAP

## 2023-02-20 NOTE — Telephone Encounter (Signed)
 Approved through 03/02/2024

## 2023-02-21 DIAGNOSIS — M6281 Muscle weakness (generalized): Secondary | ICD-10-CM | POA: Diagnosis not present

## 2023-02-23 ENCOUNTER — Telehealth (HOSPITAL_COMMUNITY): Payer: 59 | Admitting: Psychiatry

## 2023-02-24 DIAGNOSIS — M549 Dorsalgia, unspecified: Secondary | ICD-10-CM | POA: Diagnosis not present

## 2023-02-24 DIAGNOSIS — Z131 Encounter for screening for diabetes mellitus: Secondary | ICD-10-CM | POA: Diagnosis not present

## 2023-02-24 DIAGNOSIS — G8929 Other chronic pain: Secondary | ICD-10-CM | POA: Diagnosis not present

## 2023-02-24 DIAGNOSIS — M542 Cervicalgia: Secondary | ICD-10-CM | POA: Diagnosis not present

## 2023-02-24 DIAGNOSIS — I10 Essential (primary) hypertension: Secondary | ICD-10-CM | POA: Diagnosis not present

## 2023-03-11 DIAGNOSIS — I10 Essential (primary) hypertension: Secondary | ICD-10-CM | POA: Diagnosis not present

## 2023-03-11 DIAGNOSIS — Z131 Encounter for screening for diabetes mellitus: Secondary | ICD-10-CM | POA: Diagnosis not present

## 2023-03-11 DIAGNOSIS — M25512 Pain in left shoulder: Secondary | ICD-10-CM | POA: Diagnosis not present

## 2023-03-11 DIAGNOSIS — M4850XA Collapsed vertebra, not elsewhere classified, site unspecified, initial encounter for fracture: Secondary | ICD-10-CM | POA: Diagnosis not present

## 2023-03-11 DIAGNOSIS — G8929 Other chronic pain: Secondary | ICD-10-CM | POA: Diagnosis not present

## 2023-03-11 DIAGNOSIS — M542 Cervicalgia: Secondary | ICD-10-CM | POA: Diagnosis not present

## 2023-03-11 DIAGNOSIS — M25511 Pain in right shoulder: Secondary | ICD-10-CM | POA: Diagnosis not present

## 2023-03-11 DIAGNOSIS — M549 Dorsalgia, unspecified: Secondary | ICD-10-CM | POA: Diagnosis not present

## 2023-03-12 ENCOUNTER — Encounter (HOSPITAL_COMMUNITY): Payer: Self-pay | Admitting: Psychiatry

## 2023-03-12 ENCOUNTER — Telehealth (HOSPITAL_COMMUNITY): Payer: 59 | Admitting: Psychiatry

## 2023-03-12 DIAGNOSIS — F411 Generalized anxiety disorder: Secondary | ICD-10-CM | POA: Diagnosis not present

## 2023-03-12 DIAGNOSIS — F332 Major depressive disorder, recurrent severe without psychotic features: Secondary | ICD-10-CM

## 2023-03-12 MED ORDER — DULOXETINE HCL 60 MG PO CPEP: 60.0000 mg | ORAL_CAPSULE | Freq: Two times a day (BID) | ORAL | 3 refills | Status: DC

## 2023-03-12 MED ORDER — CLONAZEPAM 1 MG PO TABS: 1.0000 mg | ORAL_TABLET | Freq: Three times a day (TID) | ORAL | 2 refills | Status: DC | PRN

## 2023-03-12 NOTE — Progress Notes (Signed)
 Virtual Visit via Video Note  I connected with [name] on 03/12/23 at  1:40 PM EST by a video enabled telemedicine application and verified that I am speaking with the correct person using two identifiers.  Location: Patient: home Provider: office   I discussed the limitations of evaluation and management by telemedicine and the availability of in person appointments. The patient expressed understanding and agreed to proceed.      I discussed the assessment and treatment plan with the patient. The patient was provided an opportunity to ask questions and all were answered. The patient agreed with the plan and demonstrated an understanding of the instructions.   The patient was advised to call back or seek an in-person evaluation if the symptoms worsen or if the condition fails to improve as anticipated.  I provided 20 minutes of non-face-to-face time during this encounter.   Barnie Gull, MD  [facility] MD/PA/NP OP Progress Note  03/12/2023 1:59 PM [name]  MRN:  [id number]  Chief Complaint:  Chief Complaint  Patient presents with   Anxiety   Depression   Follow-up   HPI: This patient is a 68 year old female who is widowed and lives alone in [place]. She is on disability.   She returns for follow-up after 3 months regarding her depression and anxiety.  She states that she has been under a lot of stress.  The past on or off her 2 trailers claims that there is a lean against them.  He claims that her deceased husband never paid off alone.  However she has evidence that he did pay off the long but this man is now asking for $10,000.  She is trying to sell one of the trailers and he is claiming that if she sells that he will get most of the money.  She is very distraught and does not know what to do.  Legal aid claims they cannot help her.  She has gone through social services.  She seems to be living in destitute conditions with no heat in a space heater rotted floors etc.  It  sounds as if she needs to let this go through the court system since the man does not have a legal lean against her by her report.  She does think the medicines for depression and anxiety have continued to help. Visit Diagnosis:    ICD-10-CM   1. GAD (generalized anxiety disorder)  F41.1 clonazePAM  (KLONOPIN ) 1 MG tablet    2. Severe episode of recurrent major depressive disorder, without psychotic features (HCC)  F33.2 DULoxetine  (CYMBALTA ) 60 MG capsule      Past Psychiatric History: none  Past Medical History:  Past Medical History:  Diagnosis Date   Allergy    Arthritis    Atypical mole 06/05/2003   slight-mod-Left scapula (WS)   Atypical nevus 07/26/2003   persist. dysp- Left scapula (WS)   Basal cell carcinoma 06/05/2003   RIght chest (CX35FU)   Basal cell carcinoma 05/22/2004   superificial- RIght upper back- (CX35FU)   Basal cell carcinoma 11/27/2004   beside R nostril-(MOHS), below right outer nose (MOHS)   Basal cell carcinoma 11/08/2009   left post shoulder -(txpbx)   Basal cell carcinoma 05/24/2012   ulcerated- Left post shoulder- (CX35FU), ulcerated-mid forehead (EXC )   Basal cell carcinoma 06/24/2012   superificial- Right shoulder - (txpbx)    Basal cell carcinoma 07/29/2012    sclerosis- mid forehead (MOHS)   Basal cell carcinoma 06/06/2019   nod-right anterior  neck (CX35FU)   Cataract    Closed compression fracture of L5 vertebra (HCC)    Constipation    uses OTC meds with help   COPD (chronic obstructive pulmonary disease) (HCC)    Coronary artery disease    minimal nonobstructive    COVID    Depression    GERD (gastroesophageal reflux disease)    Heart murmur    Hx of adenomatous polyp of colon 10/03/2014   Hypercholesteremia    Hypertension    Panic attacks    Perimenopausal    Skin cancer    Squamous cell carcinoma of skin 06/05/2003   Right [place](CX35FU)   Squamous cell carcinoma of skin 05/22/2004   in situ- Left upperarm (CX35FU)    Squamous cell carcinoma of skin 11/27/2004   in situ- above Left elbow (CX35FU)   Squamous cell carcinoma of skin 03/04/2006   in situ- Left sideburn (CX35FU)   Squamous cell carcinoma of skin 11/26/2006   in situ- mid brow (Cx35FU)   Squamous cell carcinoma of skin 05/24/2012   in situ- Left chest- (CX35FU)   Squamous cell carcinoma of skin 04/05/2013   in situ- Left nose (Cx35FU)   Squamous cell carcinoma of skin 08/17/2013   well diff-above Left eye (txpbx)   Squamous cell carcinoma of skin 07/30/2016   in situ- right chest sup (Cx35FU)   Squamous cell carcinoma of skin 05/12/2017   in situ- RIght shoulder (CX35FU), in situ- Left forehead (CX35FU)   Squamous cell carcinoma of skin 11/23/2018   in situ- Right mid shin, inner- (MOHS)   Tobacco user     Past Surgical History:  Procedure Laterality Date   ABDOMINAL HYSTERECTOMY     CATARACT EXTRACTION W/PHACO Right 03/01/2021   Procedure: CATARACT EXTRACTION PHACO AND INTRAOCULAR LENS PLACEMENT (IOC);  Surgeon: Harrie Agent, MD;  Location: [facility];  Service: Ophthalmology;  Laterality: Right;  CDE: 7.31   CATARACT EXTRACTION W/PHACO Left 05/16/2021   Procedure: CATARACT EXTRACTION PHACO AND INTRAOCULAR LENS PLACEMENT (IOC);  Surgeon: Harrie Agent, MD;  Location: [facility];  Service: Ophthalmology;  Laterality: Left;  CDE 9.41    COLONOSCOPY     IR VERTEBROPLASTY CERV/THOR BX INC UNI/BIL INC/INJECT/IMAGING  04/16/2021   Right foot toe surgery     UPPER GASTROINTESTINAL ENDOSCOPY     vocal cord     polyp removal    Family Psychiatric History: See below  Family History:  Family History  Problem Relation Age of Onset   Heart disease Mother    Heart attack Mother    Heart attack Father    Colon polyps Sister    Esophageal cancer Sister    Anxiety disorder Sister    Depression Sister    Colon polyps Sister    Heart attack Brother    Colon cancer Brother 54       died at age 96   Anxiety disorder Brother    Depression  Brother    Alcohol abuse Brother    Colon polyps Brother    Colon polyps Brother    Drug abuse Son    Breast cancer Niece    Rectal cancer Neg Hx    Stomach cancer Neg Hx     Social History:  Social History   Socioeconomic History   Marital status: Single    Spouse name: Not on file   Number of children: 2   Years of education: Not on file   Highest education level: Not on file  Occupational History  Occupation: Conservation Officer, [employer]    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   Sexual activity: Not on file  Other Topics Concern   Not on file  Social History Narrative   Retired/ lives alone in one level home   Son and daughter in law that live next door and help her out a lot         Social Drivers of Health   Financial Resource Strain: Medium Risk (04/16/2022)   Overall Financial Resource Strain (CARDIA)    Difficulty of Paying Living Expenses: Somewhat hard  Food Insecurity: No Food Insecurity (04/16/2022)   Hunger Vital Sign    Worried About Running Out of Food in the Last Year: Never true    Ran Out of Food in the Last Year: Never true  Transportation Needs: No Transportation Needs (04/16/2022)   PRAPARE - Administrator, Civil Service (Medical): No    Lack of Transportation (Non-Medical): No  Physical Activity: Inactive (04/16/2022)   Exercise Vital Sign    Days of Exercise per Week: 0 days    Minutes of Exercise per Session: 0 min  Stress: Stress Concern Present (04/16/2022)   [employer]  of Occupational Health - Occupational Stress Questionnaire    Feeling of Stress : To some extent  Social Connections: Socially Isolated (04/16/2022)   Social Connection and Isolation Panel [NHANES]    Frequency of Communication with Friends and Family: More than three times a week    Frequency of Social Gatherings with Friends and Family: More than three times a week    Attends Religious Services: Never    Database Administrator or Organizations: No    Attends Banker Meetings: Never    Marital Status: Divorced    Allergies:  Allergies  Allergen Reactions   Fentanyl  Other (See Comments)   Lorcet 10-650 [Hydrocodone -Acetaminophen ] Itching   Sulfa  Antibiotics Other (See Comments)    aching   Tape Other (See Comments)    Tear skin. Please use paper tape   Wellbutrin  Xl [Bupropion  Hcl Er (Xl)]     Upset stomach /bad taste in mouth    Metabolic Disorder Labs: Lab Results  Component Value Date   HGBA1C 5.9 (H) 07/12/2013   MPG 123 (H) 07/12/2013   No results found for: PROLACTIN Lab Results  Component Value Date   CHOL 165 12/17/2022   TRIG 191 (H) 12/17/2022   HDL 40 12/17/2022   CHOLHDL 4.1 12/17/2022   VLDL 36 07/13/2013   LDLCALC 92 12/17/2022   LDLCALC 112 (H) 06/17/2022   Lab Results  Component Value Date   TSH 1.820 05/25/2017   TSH 1.080 05/17/2015    Therapeutic Level Labs: No results found for: LITHIUM No results found for: VALPROATE No results found for: CBMZ  Current Medications: Current Outpatient Medications  Medication Sig Dispense Refill   acetaminophen  (TYLENOL ) 500 MG tablet Take 1,000 mg by mouth  every 6 (six) hours as needed for moderate pain.     albuterol  (VENTOLIN  HFA) 108 (90 Base) MCG/ACT inhaler Inhale 2 puffs into the lungs every 6 (six) hours as needed for wheezing or shortness of breath. 8 g 5   alendronate  (FOSAMAX ) 70 MG tablet Take 1 tablet (70 mg total) by mouth every 7 (seven) days. Take with a full glass of  water  on an empty stomach. Do not lay down for at least 2 hours 13 tablet 3   amLODipine  (NORVASC ) 10 MG tablet Take 1 tablet (10 mg total) by mouth daily. 90 tablet 3   atorvastatin  (LIPITOR) 80 MG tablet Take 1 tablet (80 mg total) by mouth daily. 90 tablet 3   clonazePAM  (KLONOPIN ) 1 MG tablet Take 1 tablet (1 mg total) by mouth 3 (three) times daily as needed. for anxiety 90 tablet 2   dexlansoprazole  (DEXILANT ) 60 MG capsule TAKE ONE CAPSULE BY MOUTH DAILY ON EMPTY STOMACH 90 capsule 2   DULoxetine  (CYMBALTA ) 60 MG capsule Take 1 capsule (60 mg total) by mouth 2 (two) times daily. 180 capsule 3   gabapentin (NEURONTIN) 600 MG tablet Take 600 mg by mouth 3 (three) times daily.     ipratropium-albuterol  (DUONEB) 0.5-2.5 (3) MG/3ML SOLN Take 3 mLs by nebulization every 6 (six) hours as needed. 360 mL 5   levocetirizine (XYZAL ) 5 MG tablet Take 1 tablet (5 mg total) by mouth every evening. 90 tablet 3   LINZESS  145 MCG CAPS capsule TAKE 1 CAPSULE BY MOUTH DAILY TO regulate BOWEL MOVEMENT 150 capsule 0   lisinopril  (ZESTRIL ) 40 MG tablet Take 1 tablet (40 mg total) by mouth daily. 90 tablet 3   methocarbamol  (ROBAXIN ) 750 MG tablet Take 750 mg by mouth every 6 (six) hours as needed.     naproxen sodium (ALEVE) 220 MG tablet Take 220 mg by mouth 2 (two) times daily as needed (pain).     oxycodone -acetaminophen  (PERCOCET) 2.5-325 MG tablet Take 1 tablet by mouth every 12 (twelve) hours as needed for pain. 60 tablet 0   rOPINIRole  (REQUIP ) 3 MG tablet Take 1 tablet (3 mg total) by mouth at bedtime. 90 tablet 3   SYMBICORT  160-4.5 MCG/ACT inhaler Inhale 2 puffs into the lungs 2 (two) times daily. 1 each 12   Tiotropium Bromide  Monohydrate (SPIRIVA  RESPIMAT) 2.5 MCG/ACT AERS INHALE 2 puffs BY MOUTH DAILY 24 g 2   Vitamin D , Ergocalciferol , (DRISDOL ) 1.25 MG (50000 UNIT) CAPS capsule Take 1 capsule (50,000 Units total) by mouth every 7 (seven) days. 13 capsule 3   No current facility-administered  medications for this visit.     Musculoskeletal: Strength & Muscle Tone: within normal limits Gait & Station: normal Patient leans: N/A  Psychiatric Specialty Exam: Review of Systems  HENT:  Positive for rhinorrhea.   Respiratory:  Positive for cough.   Psychiatric/Behavioral:  The patient is nervous/anxious.   All other systems reviewed and are negative.   There were no vitals taken for this visit.There is no height or weight on file to calculate BMI.  General Appearance: Casual and Fairly Groomed  Eye Contact:  Good  Speech:  Clear and Coherent  Volume:  Normal  Mood:  Anxious and Dysphoric  Affect:  Congruent  Thought Process:  Goal Directed  Orientation:  Full (Time, Place, and Person)  Thought Content: Rumination   Suicidal Thoughts:  No  Homicidal Thoughts:  No  Memory:  Immediate;   Good Recent;   Good Remote;  Fair  Judgement:  Good  Insight:  Fair  Psychomotor Activity:  Decreased  Concentration:  Concentration: Fair and Attention Span: Fair  Recall:  Good  Fund of Knowledge: Fair  Language: Good  Akathisia:  No  Handed:  Right  AIMS (if indicated): not done  Assets:  Communication Skills Desire for Improvement Resilience Social Support Talents/Skills  ADL's:  Intact  Cognition: WNL  Sleep:  Fair   Screenings: GAD-7    Flowsheet Row Office Visit from 12/17/2022 in [facility] Office Visit from 08/20/2022 in [facility] Office Visit from 06/17/2022 in [facility] Office Visit from 12/16/2021 in [facility] Office Visit from 06/17/2021 in [facility]  Total GAD-7 Score 13 14 9 16 5       PHQ2-9    Flowsheet Row Office Visit from 02/12/2023 in [facility] Of [facility] Office Visit from 01/15/2023 in [facility]  Dept Of [facility] Office Visit from 12/17/2022 in [facility] Office Visit from 08/20/2022 in [facility] Office Visit from 06/17/2022 in [facility]  PHQ-2 Total Score 0 4 3 4 2   PHQ-9 Total Score -- 12 16 14 4       Flowsheet Row Video Visit from 10/21/2021 in [facility] [place] Video Visit from 07/22/2021 in [facility] [place] Admission (Discharged) from 05/16/2021 in [place] [facility]  C-SSRS RISK CATEGORY No Risk No Risk No Risk        Assessment and Plan: This patient is a 68 year old female with a history of depression and anxiety.  She is very stressed right now due to all environmental factors noted above.  She is going to try to get more help.  For now she will continue Cymbalta  60 mg twice daily for depression and clonazepam  1 mg 3 times daily for anxiety.  She will return to see me in 3 months  Collaboration of Care: Collaboration of Care: Primary Care Provider AEB notes are shared with PCP through the epic system  Patient/Guardian was advised Release of Information must be obtained prior to any record release in order to collaborate their care with an outside provider. Patient/Guardian was advised if they have not already done so to contact the registration department to sign all necessary forms in order for us  to release information regarding their care.   Consent: Patient/Guardian gives verbal consent for treatment and assignment of benefits for services provided during this visit. Patient/Guardian expressed understanding and agreed to proceed.    Barnie Gull, MD 03/12/2023, 1:59 PM

## 2023-03-13 ENCOUNTER — Ambulatory Visit: Payer: Self-pay | Admitting: Family Medicine

## 2023-03-13 ENCOUNTER — Telehealth: Payer: 59 | Admitting: Family Medicine

## 2023-03-13 ENCOUNTER — Ambulatory Visit: Payer: 59 | Admitting: Physical Medicine & Rehabilitation

## 2023-03-13 DIAGNOSIS — J069 Acute upper respiratory infection, unspecified: Secondary | ICD-10-CM

## 2023-03-13 DIAGNOSIS — J449 Chronic obstructive pulmonary disease, unspecified: Secondary | ICD-10-CM | POA: Diagnosis not present

## 2023-03-13 DIAGNOSIS — J029 Acute pharyngitis, unspecified: Secondary | ICD-10-CM

## 2023-03-13 MED ORDER — DOXYCYCLINE HYCLATE 100 MG PO TABS: 100.0000 mg | ORAL_TABLET | Freq: Two times a day (BID) | ORAL | 0 refills | Status: AC

## 2023-03-13 MED ORDER — PROMETHAZINE-DM 6.25-15 MG/5ML PO SYRP
5.0000 mL | ORAL_SOLUTION | Freq: Four times a day (QID) | ORAL | 0 refills | Status: AC | PRN
Start: 2023-03-13 — End: 2023-03-23

## 2023-03-13 NOTE — Progress Notes (Signed)
 Virtual Visit Consent   [name], you are scheduled for a virtual visit with a [facility] provider today. Just as with appointments in the office, your consent must be obtained to participate. Your consent will be active for this visit and any virtual visit you may have with one of our providers in the next 365 days. If you have a MyChart account, a copy of this consent can be sent to you electronically.  As this is a virtual visit, video technology does not allow for your provider to perform a traditional examination. This may limit your provider's ability to fully assess your condition. If your provider identifies any concerns that need to be evaluated in person or the need to arrange testing (such as labs, EKG, etc.), we will make arrangements to do so. Although advances in technology are sophisticated, we cannot ensure that it will always work on either your end or our end. If the connection with a video visit is poor, the visit may have to be switched to a telephone visit. With either a video or telephone visit, we are not always able to ensure that we have a secure connection.  By engaging in this virtual visit, you consent to the provision of healthcare and authorize for your insurance to be billed (if applicable) for the services provided during this visit. Depending on your insurance coverage, you may receive a charge related to this service.  I need to obtain your verbal consent now. Are you willing to proceed with your visit today? [name] has provided verbal consent on 03/13/2023 for a virtual visit (video or telephone). Loa Lamp, FNP  Date: 03/13/2023 4:50 PM  Virtual Visit via Video Note   I, Loa Lamp, connected with  [name]  ([id number], 1955-11-29) on 03/13/23 at  4:45 PM EST by a video-enabled telemedicine application and verified that I am speaking with the correct person using two identifiers.  Location: Patient: Virtual Visit Location Patient:  Home Provider: Virtual Visit Location Provider: Home Office   I discussed the limitations of evaluation and management by telemedicine and the availability of in person appointments. The patient expressed understanding and agreed to proceed.    History of Present Illness: [name] is a 68 y.o. who identifies as a female who was assigned female at birth, and is being seen today for cough, sinus pressure and post nasal drainage. Sx for 2-3 days worsening with copd. She says her breathing is not worse than usual but will get that way quickly. In no distress but appears sickly. She also reports sore throat with white pus on the back.   HPI: HPI  Problems:  Patient Active Problem List   Diagnosis Date Noted   Back pain 03/29/2021   Hypokalemia 03/29/2021   Elevated d-dimer 03/29/2021   Leukocytosis 03/29/2021   Thrombocytosis 03/29/2021   Compression fracture of T5 vertebra (HCC) 03/29/2021   Pulmonary nodule 03/29/2021   Acute respiratory failure with hypoxia (HCC) 03/29/2021   GERD (gastroesophageal reflux disease) 03/29/2021   CAD (coronary artery disease) 03/29/2021   Anxiety 03/29/2021   COPD with acute exacerbation (HCC) 03/28/2021   Lumbar herniated disc 12/10/2018   Major depression 04/02/2015   Opiate dependence (HCC) 01/22/2015   Hx of adenomatous polyp of colon 10/03/2014   Vitamin D  deficiency 05/08/2014   COPD (chronic obstructive pulmonary disease) (HCC) 05/06/2011   Hyperlipidemia, unspecified 05/05/2011   INSOMNIA 11/13/2008   TOBACCO ABUSE 11/10/2008   Essential hypertension 11/10/2008  Osteoarthritis 11/10/2008    Allergies:  Allergies  Allergen Reactions   Fentanyl  Other (See Comments)   Lorcet 10-650 [Hydrocodone -Acetaminophen ] Itching   Sulfa  Antibiotics Other (See Comments)    aching   Tape Other (See Comments)    Tear skin. Please use paper tape   Wellbutrin  Xl [Bupropion  Hcl Er (Xl)]     Upset stomach /bad taste in mouth   Medications:   Current Outpatient Medications:    doxycycline  (VIBRA -TABS) 100 MG tablet, Take 1 tablet (100 mg total) by mouth 2 (two) times daily for 10 days., Disp: 20 tablet, Rfl: 0   promethazine -dextromethorphan  (PROMETHAZINE -DM) 6.25-15 MG/5ML syrup, Take 5 mLs by mouth 4 (four) times daily as needed for up to 10 days for cough., Disp: 118 mL, Rfl: 0   acetaminophen  (TYLENOL ) 500 MG tablet, Take 1,000 mg by mouth every 6 (six) hours as needed for moderate pain., Disp: , Rfl:    albuterol  (VENTOLIN  HFA) 108 (90 Base) MCG/ACT inhaler, Inhale 2 puffs into the lungs every 6 (six) hours as needed for wheezing or shortness of breath., Disp: 8 g, Rfl: 5   alendronate  (FOSAMAX ) 70 MG tablet, Take 1 tablet (70 mg total) by mouth every 7 (seven) days. Take with a full glass of water  on an empty stomach. Do not lay down for at least 2 hours, Disp: 13 tablet, Rfl: 3   amLODipine  (NORVASC ) 10 MG tablet, Take 1 tablet (10 mg total) by mouth daily., Disp: 90 tablet, Rfl: 3   atorvastatin  (LIPITOR) 80 MG tablet, Take 1 tablet (80 mg total) by mouth daily., Disp: 90 tablet, Rfl: 3   clonazePAM  (KLONOPIN ) 1 MG tablet, Take 1 tablet (1 mg total) by mouth 3 (three) times daily as needed. for anxiety, Disp: 90 tablet, Rfl: 2   dexlansoprazole  (DEXILANT ) 60 MG capsule, TAKE ONE CAPSULE BY MOUTH DAILY ON EMPTY STOMACH, Disp: 90 capsule, Rfl: 2   DULoxetine  (CYMBALTA ) 60 MG capsule, Take 1 capsule (60 mg total) by mouth 2 (two) times daily., Disp: 180 capsule, Rfl: 3   gabapentin (NEURONTIN) 600 MG tablet, Take 600 mg by mouth 3 (three) times daily., Disp: , Rfl:    ipratropium-albuterol  (DUONEB) 0.5-2.5 (3) MG/3ML SOLN, Take 3 mLs by nebulization every 6 (six) hours as needed., Disp: 360 mL, Rfl: 5   levocetirizine (XYZAL ) 5 MG tablet, Take 1 tablet (5 mg total) by mouth every evening., Disp: 90 tablet, Rfl: 3   LINZESS  145 MCG CAPS capsule, TAKE 1 CAPSULE BY MOUTH DAILY TO regulate BOWEL MOVEMENT, Disp: 150 capsule, Rfl: 0    lisinopril  (ZESTRIL ) 40 MG tablet, Take 1 tablet (40 mg total) by mouth daily., Disp: 90 tablet, Rfl: 3   methocarbamol  (ROBAXIN ) 750 MG tablet, Take 750 mg by mouth every 6 (six) hours as needed., Disp: , Rfl:    naproxen sodium (ALEVE) 220 MG tablet, Take 220 mg by mouth 2 (two) times daily as needed (pain)., Disp: , Rfl:    oxycodone -acetaminophen  (PERCOCET) 2.5-325 MG tablet, Take 1 tablet by mouth every 12 (twelve) hours as needed for pain., Disp: 60 tablet, Rfl: 0   rOPINIRole  (REQUIP ) 3 MG tablet, Take 1 tablet (3 mg total) by mouth at bedtime., Disp: 90 tablet, Rfl: 3   SYMBICORT  160-4.5 MCG/ACT inhaler, Inhale 2 puffs into the lungs 2 (two) times daily., Disp: 1 each, Rfl: 12   Tiotropium Bromide  Monohydrate (SPIRIVA  RESPIMAT) 2.5 MCG/ACT AERS, INHALE 2 puffs BY MOUTH DAILY, Disp: 24 g, Rfl: 2   Vitamin D , Ergocalciferol , (  DRISDOL ) 1.25 MG (50000 UNIT) CAPS capsule, Take 1 capsule (50,000 Units total) by mouth every 7 (seven) days., Disp: 13 capsule, Rfl: 3  Observations/Objective: Patient is well-developed, well-nourished in no acute distress.  Resting comfortably  at home.  Head is normocephalic, atraumatic.  No labored breathing.  Speech is clear and coherent with logical content.  Patient is alert and oriented at baseline.    Assessment and Plan: 1. Upper respiratory tract infection, unspecified type (Primary)  2. Chronic obstructive pulmonary disease, unspecified COPD type (HCC)  Increase fluids, UC if sx persist or worsen.   Follow Up Instructions: I discussed the assessment and treatment plan with the patient. The patient was provided an opportunity to ask questions and all were answered. The patient agreed with the plan and demonstrated an understanding of the instructions.  A copy of instructions were sent to the patient via MyChart unless otherwise noted below.     The patient was advised to call back or seek an in-person evaluation if the symptoms worsen or if the  condition fails to improve as anticipated.    Samanthamarie Ezzell, FNP

## 2023-03-13 NOTE — Telephone Encounter (Signed)
 Copied from CRM [id number]. Topic: Clinical - Medical Advice >> Mar 13, 2023  2:27 PM Twila L wrote: Reason for CRM: Patient wants to know if Dr Zollie can call heer in a presc for her cough, head stopped up and sor throat  COPD, no more trouble breathing than normal, throat's killing me, losing my throat, white stuff on back of throat   Chief Complaint: suspected strep Symptoms: sore throat 6/10 pain, headache 7/10 pain, white stuff on back of throat, feels feverish (no thermometer), coughing up green phlegm, runny nose Frequency: continual Pertinent Negatives: Patient denies chest pain, SOB, dizziness Disposition: [] ED /[] Urgent Care (no appt availability in office) / [] Appointment(In office/virtual)/ []  [facility]/ [] Home Care/ [] Refused Recommended Disposition /[] [facility]/ [x]  Follow-up with PCP Additional Notes: Pt reporting that she started having sore throat yesterday that keeps getting worse, currently 6/10 pain with difficulty eating food, hard to swallow but still trying to eat and no trouble with liquids. Pt reporting that she also has congestion, 7/10 headache, runny nose, cough productive to some greenish looking stuff, and white stuff on back of throat. Advised pt be seen within 24 hours for exam. Pt reporting no way to get there, requesting meds and/or virtual appt as pt has no reliable transportation or person to take her to appt. Please advise.  Reason for Disposition  [1] Pus on tonsils (back of throat) AND [2]  fever AND [3] swollen neck lymph nodes (glands)  Answer Assessment - Initial Assessment Questions 1. ONSET: When did the throat start hurting? (Hours or days ago)      Yesterday then when woke up today worse, keeps getting worse 2. SEVERITY: How bad is the sore throat? (Scale 1-10; mild, moderate or severe)   - MILD (1-3):  Doesn't interfere with eating or normal activities.   - MODERATE (4-7): Interferes with eating some  solids and normal activities.   - SEVERE (8-10):  Excruciating pain, interferes with most normal activities.   - SEVERE WITH DYSPHAGIA (10): Can't swallow liquids, drooling.     6/10, not eating that much but trying to, hard to swallow, no trouble with liquids 3. STREP EXPOSURE: Has there been any exposure to strep within the past week? If Yes, ask: What type of contact occurred?      Denies, been in the house 4.  VIRAL SYMPTOMS: Are there any symptoms of a cold, such as a runny nose, cough, hoarse voice or red eyes?      Congestion, cough, coughing up some greenish looking stuff. Denies redness to eyes. Reporting runny nose runny one minute and stopped up the next 5. FEVER: Do you have a fever? If Yes, ask: What is your temperature, how was it measured, and when did it start?     Feel like I do but don't have thermometer 6. PUS ON THE TONSILS: Is there pus on the tonsils in the back of your throat?     White stuff on back of throat 7. OTHER SYMPTOMS: Do you have any other symptoms? (e.g., difficulty breathing, headache, rash)     Headache 7/10 to forehead and temples. Denies other symptoms  Protocols used: Sore Throat-A-AH

## 2023-03-13 NOTE — Telephone Encounter (Signed)
 Spoke with supervisors, advised scheduling virtual appt since before weekend. Called pt back and scheduled virtual appt for later this afternoon. Confirmed how to get into virtual appt with pt per request, pt verbalized understanding.

## 2023-03-13 NOTE — Patient Instructions (Signed)
 Acute Bronchitis, Adult  Acute bronchitis is sudden inflammation of the main airways (bronchi) that come off the windpipe (trachea) in the lungs. The swelling causes the airways to get smaller and make more mucus than normal. This can make it hard to breathe and can cause coughing or noisy breathing (wheezing). Acute bronchitis may last several weeks. The cough may last longer. Allergies, asthma, and exposure to smoke may make the condition worse. What are the causes? This condition can be caused by germs and by substances that irritate the lungs, including: Cold and flu viruses. The most common cause of this condition is the virus that causes the common cold. Bacteria. This is less common. Breathing in substances that irritate the lungs, including: Smoke from cigarettes and other forms of tobacco. Dust and pollen. Fumes from household cleaning products, gases, or burned fuel. Indoor or outdoor air pollution. What increases the risk? The following factors may make you more likely to develop this condition: A weak body's defense system, also called the immune system. A condition that affects your lungs and breathing, such as asthma. What are the signs or symptoms? Common symptoms of this condition include: Coughing. This may bring up clear, yellow, or green mucus from your lungs (sputum). Wheezing. Runny or stuffy nose. Having too much mucus in your lungs (chest congestion). Shortness of breath. Aches and pains, including sore throat or chest. How is this diagnosed? This condition is usually diagnosed based on: Your symptoms and medical history. A physical exam. You may also have other tests, including tests to rule out other conditions, such as pneumonia. These tests include: A test of lung function. Test of a mucus sample to look for the presence of bacteria. Tests to check the oxygen level in your blood. Blood tests. Chest X-ray. How is this treated? Most cases of acute  bronchitis clear up over time without treatment. Your health care provider may recommend: Drinking more fluids to help thin your mucus so it is easier to cough up. Taking inhaled medicine (inhaler) to improve air flow in and out of your lungs. Using a vaporizer or a humidifier. These are machines that add water  to the air to help you breathe better. Taking a medicine that thins mucus and clears congestion (expectorant). Taking a medicine that prevents or stops coughing (cough suppressant). It is not common to take an antibiotic medicine for this condition. Follow these instructions at home:  Take over-the-counter and prescription medicines only as told by your health care provider. Use an inhaler, vaporizer, or humidifier as told by your health care provider. Take two teaspoons (10 mL) of honey at bedtime to lessen coughing at night. Drink enough fluid to keep your urine pale yellow. Do not use any products that contain nicotine  or tobacco. These products include cigarettes, chewing tobacco, and vaping devices, such as e-cigarettes. If you need help quitting, ask your health care provider. Get plenty of rest. Return to your normal activities as told by your health care provider. Ask your health care provider what activities are safe for you. Keep all follow-up visits. This is important. How is this prevented? To lower your risk of getting this condition again: Wash your hands often with soap and water  for at least 20 seconds. If soap and water  are not available, use hand sanitizer. Avoid contact with people who have cold symptoms. Try not to touch your mouth, nose, or eyes with your hands. Avoid breathing in smoke or chemical fumes. Breathing smoke or chemical fumes will make  your condition worse. Get the flu shot every year. Contact a health care provider if: Your symptoms do not improve after 2 weeks. You have trouble coughing up the mucus. Your cough keeps you awake at night. You have  a fever. Get help right away if you: Cough up blood. Feel pain in your chest. Have severe shortness of breath. Faint or keep feeling like you are going to faint. Have a severe headache. Have a fever or chills that get worse. These symptoms may represent a serious problem that is an emergency. Do not wait to see if the symptoms will go away. Get medical help right away. Call your local emergency services (911 in the [place] Do not drive yourself to the hospital. Summary Acute bronchitis is inflammation of the main airways (bronchi) that come off the windpipe (trachea) in the lungs. The swelling causes the airways to get smaller and make more mucus than normal. Drinking more fluids can help thin your mucus so it is easier to cough up. Take over-the-counter and prescription medicines only as told by your health care provider. Do not use any products that contain nicotine  or tobacco. These products include cigarettes, chewing tobacco, and vaping devices, such as e-cigarettes. If you need help quitting, ask your health care provider. Contact a health care provider if your symptoms do not improve after 2 weeks. This information is not intended to replace advice given to you by your health care provider. Make sure you discuss any questions you have with your health care provider. Document Revised: 05/30/2021 Document Reviewed: 06/20/2020 Elsevier Patient Education  2024 [employer] Pharyngitis  Pharyngitis is inflammation of the throat (pharynx). It is a very common cause of sore throat. Pharyngitis can be caused by a bacteria, but it is usually caused by a virus. Most cases of pharyngitis get better on their own without treatment. What are the causes? This condition may be caused by: Infection by viruses (viral). Viral pharyngitis spreads easily from person to person (is contagious) through coughing, sneezing, and sharing of personal items or utensils such as cups, forks, spoons, and  toothbrushes. Infection by bacteria (bacterial). Bacterial pharyngitis may be spread by touching the nose or face after coming in contact with the bacteria, or through close contact, such as kissing. Allergies. Allergies can cause buildup of mucus in the throat (post-nasal drip), leading to inflammation and irritation. Allergies can also cause blocked nasal passages, forcing breathing through the mouth, which dries and irritates the throat. What increases the risk? You are more likely to develop this condition if: You are 60-44 years old. You are exposed to crowded environments such as daycare, school, or dormitory living. You live in a cold climate. You have a weakened disease-fighting (immune) system. What are the signs or symptoms? Symptoms of this condition vary by the cause. Common symptoms of this condition include: Sore throat. Fatigue. Low-grade fever. Stuffy nose (nasal congestion) and cough. Headache. Other symptoms may include: Glands in the neck (lymph nodes) that are swollen. Skin rashes. Plaque-like film on the throat or tonsils. This is often a symptom of bacterial pharyngitis. Vomiting. Red, itchy eyes (conjunctivitis). Loss of appetite. Joint pain and muscle aches. Enlarged tonsils. How is this diagnosed? This condition may be diagnosed based on your medical history and a physical exam. Your health care provider will ask you questions about your illness and your symptoms. A swab of your throat may be done to check for bacteria (rapid strep test). Other lab tests may also be done,  depending on the suspected cause, but these are rare. How is this treated? Many times, treatment is not needed for this condition. Pharyngitis usually gets better in 3-4 days without treatment. Bacterial pharyngitis may be treated with antibiotic medicines. Follow these instructions at home: Medicines Take over-the-counter and prescription medicines only as told by your health care  provider. If you were prescribed an antibiotic medicine, take it as told by your health care provider. Do not stop taking the antibiotic even if you start to feel better. Use throat sprays to soothe your throat as told by your health care provider. Children can get pharyngitis. Do not give your child aspirin  because of the association with Reye's syndrome. Managing pain To help with pain, try: Sipping warm liquids, such as broth, herbal tea, or warm water . Eating or drinking cold or frozen liquids, such as frozen ice pops. Gargling with a mixture of salt and water  3-4 times a day or as needed. To make salt water , completely dissolve -1 tsp (3-6 g) of salt in 1 cup (237 mL) of warm water . Sucking on hard candy or throat lozenges. Putting a cool-mist humidifier in your bedroom at night to moisten the air. Sitting in the bathroom with the door closed for 5-10 minutes while you run hot water  in the shower.  General instructions  Do not use any products that contain nicotine  or tobacco. These products include cigarettes, chewing tobacco, and vaping devices, such as e-cigarettes. If you need help quitting, ask your health care provider. Rest as told by your health care provider. Drink enough fluid to keep your urine pale yellow. How is this prevented? To help prevent becoming infected or spreading infection: Wash your hands often with soap and water  for at least 20 seconds. If soap and water  are not available, use hand sanitizer. Do not touch your eyes, nose, or mouth with unwashed hands, and wash hands after touching these areas. Do not share cups or eating utensils. Avoid close contact with people who are sick. Contact a health care provider if: You have large, tender lumps in your neck. You have a rash. You cough up green, yellow-brown, or bloody mucus. Get help right away if: Your neck becomes stiff. You drool or are unable to swallow liquids. You cannot drink or take medicines without  vomiting. You have severe pain that does not go away, even after you take medicine. You have trouble breathing, and it is not caused by a stuffy nose. You have new pain and swelling in your joints such as the knees, ankles, wrists, or elbows. These symptoms may represent a serious problem that is an emergency. Do not wait to see if the symptoms will go away. Get medical help right away. Call your local emergency services (911 in the [place] Do not drive yourself to the hospital. Summary Pharyngitis is redness, pain, and swelling (inflammation) of the throat (pharynx). While pharyngitis can be caused by a bacteria, the most common causes are viral. Most cases of pharyngitis get better on their own without treatment. Bacterial pharyngitis is treated with antibiotic medicines. This information is not intended to replace advice given to you by your health care provider. Make sure you discuss any questions you have with your health care provider. Document Revised: 05/16/2020 Document Reviewed: 05/16/2020 Elsevier Patient Education  2024 [employer].

## 2023-03-18 DIAGNOSIS — G8929 Other chronic pain: Secondary | ICD-10-CM | POA: Diagnosis not present

## 2023-03-18 DIAGNOSIS — R030 Elevated blood-pressure reading, without diagnosis of hypertension: Secondary | ICD-10-CM | POA: Diagnosis not present

## 2023-03-18 DIAGNOSIS — F17210 Nicotine dependence, cigarettes, uncomplicated: Secondary | ICD-10-CM | POA: Diagnosis not present

## 2023-03-18 DIAGNOSIS — M25511 Pain in right shoulder: Secondary | ICD-10-CM | POA: Diagnosis not present

## 2023-03-18 DIAGNOSIS — R7303 Prediabetes: Secondary | ICD-10-CM | POA: Diagnosis not present

## 2023-03-18 DIAGNOSIS — M25512 Pain in left shoulder: Secondary | ICD-10-CM | POA: Diagnosis not present

## 2023-03-18 DIAGNOSIS — M4850XA Collapsed vertebra, not elsewhere classified, site unspecified, initial encounter for fracture: Secondary | ICD-10-CM | POA: Diagnosis not present

## 2023-03-18 DIAGNOSIS — M542 Cervicalgia: Secondary | ICD-10-CM | POA: Diagnosis not present

## 2023-03-18 DIAGNOSIS — M549 Dorsalgia, unspecified: Secondary | ICD-10-CM | POA: Diagnosis not present

## 2023-03-18 DIAGNOSIS — Z87891 Personal history of nicotine dependence: Secondary | ICD-10-CM | POA: Diagnosis not present

## 2023-03-18 DIAGNOSIS — Z789 Other specified health status: Secondary | ICD-10-CM | POA: Diagnosis not present

## 2023-03-25 DIAGNOSIS — M6281 Muscle weakness (generalized): Secondary | ICD-10-CM | POA: Diagnosis not present

## 2023-03-31 DIAGNOSIS — L821 Other seborrheic keratosis: Secondary | ICD-10-CM | POA: Diagnosis not present

## 2023-03-31 DIAGNOSIS — D0439 Carcinoma in situ of skin of other parts of face: Secondary | ICD-10-CM | POA: Diagnosis not present

## 2023-03-31 DIAGNOSIS — L905 Scar conditions and fibrosis of skin: Secondary | ICD-10-CM | POA: Diagnosis not present

## 2023-03-31 DIAGNOSIS — D485 Neoplasm of uncertain behavior of skin: Secondary | ICD-10-CM | POA: Diagnosis not present

## 2023-04-03 ENCOUNTER — Other Ambulatory Visit (HOSPITAL_COMMUNITY): Payer: Self-pay | Admitting: Adult Medicine

## 2023-04-03 DIAGNOSIS — Z1231 Encounter for screening mammogram for malignant neoplasm of breast: Secondary | ICD-10-CM

## 2023-04-08 ENCOUNTER — Ambulatory Visit (HOSPITAL_COMMUNITY): Payer: 59

## 2023-04-16 ENCOUNTER — Ambulatory Visit (HOSPITAL_COMMUNITY): Payer: 59

## 2023-04-17 DIAGNOSIS — M542 Cervicalgia: Secondary | ICD-10-CM | POA: Diagnosis not present

## 2023-04-17 DIAGNOSIS — Z122 Encounter for screening for malignant neoplasm of respiratory organs: Secondary | ICD-10-CM | POA: Diagnosis not present

## 2023-04-17 DIAGNOSIS — M4850XA Collapsed vertebra, not elsewhere classified, site unspecified, initial encounter for fracture: Secondary | ICD-10-CM | POA: Diagnosis not present

## 2023-04-17 DIAGNOSIS — M549 Dorsalgia, unspecified: Secondary | ICD-10-CM | POA: Diagnosis not present

## 2023-04-17 DIAGNOSIS — Z0130 Encounter for examination of blood pressure without abnormal findings: Secondary | ICD-10-CM | POA: Diagnosis not present

## 2023-04-17 DIAGNOSIS — F17210 Nicotine dependence, cigarettes, uncomplicated: Secondary | ICD-10-CM | POA: Diagnosis not present

## 2023-04-17 DIAGNOSIS — R7303 Prediabetes: Secondary | ICD-10-CM | POA: Diagnosis not present

## 2023-04-17 DIAGNOSIS — G8929 Other chronic pain: Secondary | ICD-10-CM | POA: Diagnosis not present

## 2023-04-17 DIAGNOSIS — Z1239 Encounter for other screening for malignant neoplasm of breast: Secondary | ICD-10-CM | POA: Diagnosis not present

## 2023-04-17 DIAGNOSIS — Z0189 Encounter for other specified special examinations: Secondary | ICD-10-CM | POA: Diagnosis not present

## 2023-04-17 DIAGNOSIS — Z789 Other specified health status: Secondary | ICD-10-CM | POA: Diagnosis not present

## 2023-04-21 DIAGNOSIS — M25511 Pain in right shoulder: Secondary | ICD-10-CM | POA: Diagnosis not present

## 2023-04-21 DIAGNOSIS — M25512 Pain in left shoulder: Secondary | ICD-10-CM | POA: Diagnosis not present

## 2023-04-24 DIAGNOSIS — M6281 Muscle weakness (generalized): Secondary | ICD-10-CM | POA: Diagnosis not present

## 2023-04-27 DIAGNOSIS — Z1211 Encounter for screening for malignant neoplasm of colon: Secondary | ICD-10-CM | POA: Diagnosis not present

## 2023-04-27 DIAGNOSIS — F17210 Nicotine dependence, cigarettes, uncomplicated: Secondary | ICD-10-CM | POA: Diagnosis not present

## 2023-04-27 DIAGNOSIS — M542 Cervicalgia: Secondary | ICD-10-CM | POA: Diagnosis not present

## 2023-04-27 DIAGNOSIS — R7303 Prediabetes: Secondary | ICD-10-CM | POA: Diagnosis not present

## 2023-04-27 DIAGNOSIS — M549 Dorsalgia, unspecified: Secondary | ICD-10-CM | POA: Diagnosis not present

## 2023-04-27 DIAGNOSIS — Z6822 Body mass index (BMI) 22.0-22.9, adult: Secondary | ICD-10-CM | POA: Diagnosis not present

## 2023-04-27 DIAGNOSIS — R030 Elevated blood-pressure reading, without diagnosis of hypertension: Secondary | ICD-10-CM | POA: Diagnosis not present

## 2023-04-27 DIAGNOSIS — Z789 Other specified health status: Secondary | ICD-10-CM | POA: Diagnosis not present

## 2023-04-27 DIAGNOSIS — G8929 Other chronic pain: Secondary | ICD-10-CM | POA: Diagnosis not present

## 2023-04-27 DIAGNOSIS — Z0130 Encounter for examination of blood pressure without abnormal findings: Secondary | ICD-10-CM | POA: Diagnosis not present

## 2023-04-27 DIAGNOSIS — M4850XA Collapsed vertebra, not elsewhere classified, site unspecified, initial encounter for fracture: Secondary | ICD-10-CM | POA: Diagnosis not present

## 2023-04-30 DIAGNOSIS — D0439 Carcinoma in situ of skin of other parts of face: Secondary | ICD-10-CM | POA: Diagnosis not present

## 2023-04-30 DIAGNOSIS — D485 Neoplasm of uncertain behavior of skin: Secondary | ICD-10-CM | POA: Diagnosis not present

## 2023-04-30 DIAGNOSIS — L821 Other seborrheic keratosis: Secondary | ICD-10-CM | POA: Diagnosis not present

## 2023-04-30 DIAGNOSIS — D0471 Carcinoma in situ of skin of right lower limb, including hip: Secondary | ICD-10-CM | POA: Diagnosis not present

## 2023-04-30 DIAGNOSIS — Z79899 Other long term (current) drug therapy: Secondary | ICD-10-CM | POA: Diagnosis not present

## 2023-04-30 DIAGNOSIS — C44329 Squamous cell carcinoma of skin of other parts of face: Secondary | ICD-10-CM | POA: Diagnosis not present

## 2023-05-07 ENCOUNTER — Other Ambulatory Visit: Payer: Self-pay | Admitting: Family Medicine

## 2023-05-07 DIAGNOSIS — K5903 Drug induced constipation: Secondary | ICD-10-CM

## 2023-05-07 DIAGNOSIS — K2100 Gastro-esophageal reflux disease with esophagitis, without bleeding: Secondary | ICD-10-CM

## 2023-05-21 DIAGNOSIS — G5603 Carpal tunnel syndrome, bilateral upper limbs: Secondary | ICD-10-CM | POA: Diagnosis not present

## 2023-05-24 DIAGNOSIS — M6281 Muscle weakness (generalized): Secondary | ICD-10-CM | POA: Diagnosis not present

## 2023-05-25 DIAGNOSIS — Z79899 Other long term (current) drug therapy: Secondary | ICD-10-CM | POA: Diagnosis not present

## 2023-05-25 DIAGNOSIS — M545 Low back pain, unspecified: Secondary | ICD-10-CM | POA: Diagnosis not present

## 2023-05-25 DIAGNOSIS — M542 Cervicalgia: Secondary | ICD-10-CM | POA: Diagnosis not present

## 2023-05-25 DIAGNOSIS — G8929 Other chronic pain: Secondary | ICD-10-CM | POA: Diagnosis not present

## 2023-05-27 DIAGNOSIS — C44329 Squamous cell carcinoma of skin of other parts of face: Secondary | ICD-10-CM | POA: Diagnosis not present

## 2023-05-27 DIAGNOSIS — L821 Other seborrheic keratosis: Secondary | ICD-10-CM | POA: Diagnosis not present

## 2023-06-07 ENCOUNTER — Other Ambulatory Visit: Payer: Self-pay | Admitting: Family Medicine

## 2023-06-07 DIAGNOSIS — K2100 Gastro-esophageal reflux disease with esophagitis, without bleeding: Secondary | ICD-10-CM

## 2023-06-07 DIAGNOSIS — K5903 Drug induced constipation: Secondary | ICD-10-CM

## 2023-06-08 DIAGNOSIS — D0471 Carcinoma in situ of skin of right lower limb, including hip: Secondary | ICD-10-CM | POA: Diagnosis not present

## 2023-06-08 DIAGNOSIS — D485 Neoplasm of uncertain behavior of skin: Secondary | ICD-10-CM | POA: Diagnosis not present

## 2023-06-08 MED ORDER — DEXLANSOPRAZOLE 60 MG PO CPDR: DELAYED_RELEASE_CAPSULE | ORAL | 0 refills | Status: DC

## 2023-06-08 MED ORDER — LINACLOTIDE 145 MCG PO CAPS: ORAL_CAPSULE | ORAL | 0 refills | Status: DC

## 2023-06-08 NOTE — Telephone Encounter (Signed)
 Pt made appt on April 22

## 2023-06-08 NOTE — Telephone Encounter (Signed)
 Stacks pt NTBS 30-d given 05/07/23

## 2023-06-08 NOTE — Addendum Note (Signed)
 Addended by: Julious Payer D on: 06/08/2023 09:40 AM   Modules accepted: Orders

## 2023-06-11 ENCOUNTER — Ambulatory Visit (HOSPITAL_COMMUNITY): Admission: RE | Admit: 2023-06-11 | Payer: 59 | Source: Ambulatory Visit

## 2023-06-12 ENCOUNTER — Ambulatory Visit: Payer: 59 | Admitting: Cardiology

## 2023-06-15 ENCOUNTER — Encounter (HOSPITAL_COMMUNITY): Payer: Self-pay | Admitting: Psychiatry

## 2023-06-15 ENCOUNTER — Telehealth (HOSPITAL_COMMUNITY): Payer: 59 | Admitting: Psychiatry

## 2023-06-15 DIAGNOSIS — F332 Major depressive disorder, recurrent severe without psychotic features: Secondary | ICD-10-CM

## 2023-06-15 DIAGNOSIS — F411 Generalized anxiety disorder: Secondary | ICD-10-CM | POA: Diagnosis not present

## 2023-06-15 MED ORDER — CLONAZEPAM 1 MG PO TABS: 1.0000 mg | ORAL_TABLET | Freq: Three times a day (TID) | ORAL | 2 refills | Status: DC | PRN

## 2023-06-15 MED ORDER — DULOXETINE HCL 60 MG PO CPEP: 60.0000 mg | ORAL_CAPSULE | Freq: Two times a day (BID) | ORAL | 3 refills | Status: DC

## 2023-06-15 NOTE — Progress Notes (Signed)
 Virtual Visit via Telephone Note  I connected with [name] on 06/15/23 at  3:00 PM EDT by telephone and verified that I am speaking with the correct person using two identifiers.  Location: Patient: home Provider: office   I discussed the limitations, risks, security and privacy concerns of performing an evaluation and management service by telephone and the availability of in person appointments. I also discussed with the patient that there may be a patient responsible charge related to this service. The patient expressed understanding and agreed to proceed.      I discussed the assessment and treatment plan with the patient. The patient was provided an opportunity to ask questions and all were answered. The patient agreed with the plan and demonstrated an understanding of the instructions.   The patient was advised to call back or seek an in-person evaluation if the symptoms worsen or if the condition fails to improve as anticipated.  I provided 20 minutes of non-face-to-face time during this encounter.   Diannia Ruder, MD  [facility] MD/PA/NP OP Progress Note  06/15/2023 3:15 PM [name]  MRN:  [id number]  Chief Complaint:  Chief Complaint  Patient presents with   Depression   Anxiety   Follow-up   HPI: This patient is a 68 year old female who is widowed and lives alone in [place].  She is on disability.  The patient returns for follow-up after 3 months regarding her depression and anxiety.  She states that she is under less stress because the conflicts about her finances are generally resolved.  However she often feels lonely and worried about her sons.  One is in prison and 1 has serious medical issues.  Overall however she states that her anxiety and depression are under good control and she is sleeping fairly well.  She denies any thoughts of self-harm or suicide Visit Diagnosis:    ICD-10-CM   1. Severe episode of recurrent major depressive disorder, without psychotic  features (HCC)  F33.2 DULoxetine (CYMBALTA) 60 MG capsule    2. GAD (generalized anxiety disorder)  F41.1 clonazePAM (KLONOPIN) 1 MG tablet      Past Psychiatric History: none  Past Medical History:  Past Medical History:  Diagnosis Date   Allergy    Arthritis    Atypical mole 06/05/2003   slight-mod-Left scapula (WS)   Atypical nevus 07/26/2003   persist. dysp- Left scapula (WS)   Basal cell carcinoma 06/05/2003   RIght chest (CX35FU)   Basal cell carcinoma 05/22/2004   superificial- RIght upper back- (CX35FU)   Basal cell carcinoma 11/27/2004   beside R nostril-(MOHS), below right outer nose (MOHS)   Basal cell carcinoma 11/08/2009   left post shoulder -(txpbx)   Basal cell carcinoma 05/24/2012   ulcerated- Left post shoulder- (CX35FU), ulcerated-mid forehead (EXC )   Basal cell carcinoma 06/24/2012   superificial- Right shoulder - (txpbx)    Basal cell carcinoma 07/29/2012    sclerosis- mid forehead (MOHS)   Basal cell carcinoma 06/06/2019   nod-right anterior neck (CX35FU)   Cataract    Closed compression fracture of L5 vertebra (HCC)    Constipation    uses OTC meds with help   COPD (chronic obstructive pulmonary disease) (HCC)    Coronary artery disease    minimal nonobstructive    COVID    Depression    GERD (gastroesophageal reflux disease)    Heart murmur    Hx of adenomatous polyp of colon 10/03/2014   Hypercholesteremia    Hypertension  Panic attacks    Perimenopausal    Skin cancer    Squamous cell carcinoma of skin 06/05/2003   Right [place](CX35FU)   Squamous cell carcinoma of skin 05/22/2004   in situ- Left upperarm (CX35FU)   Squamous cell carcinoma of skin 11/27/2004   in situ- above Left elbow (CX35FU)   Squamous cell carcinoma of skin 03/04/2006   in situ- Left sideburn (CX35FU)   Squamous cell carcinoma of skin 11/26/2006   in situ- mid brow (Cx35FU)   Squamous cell carcinoma of skin 05/24/2012   in situ- Left chest- (CX35FU)    Squamous cell carcinoma of skin 04/05/2013   in situ- Left nose (Cx35FU)   Squamous cell carcinoma of skin 08/17/2013   well diff-above Left eye (txpbx)   Squamous cell carcinoma of skin 07/30/2016   in situ- right chest sup (Cx35FU)   Squamous cell carcinoma of skin 05/12/2017   in situ- RIght shoulder (CX35FU), in situ- Left forehead (CX35FU)   Squamous cell carcinoma of skin 11/23/2018   in situ- Right mid shin, inner- (MOHS)   Tobacco user     Past Surgical History:  Procedure Laterality Date   ABDOMINAL HYSTERECTOMY     CATARACT EXTRACTION W/PHACO Right 03/01/2021   Procedure: CATARACT EXTRACTION PHACO AND INTRAOCULAR LENS PLACEMENT (IOC);  Surgeon: Fabio Pierce, MD;  Location: [facility];  Service: Ophthalmology;  Laterality: Right;  CDE: 7.31   CATARACT EXTRACTION W/PHACO Left 05/16/2021   Procedure: CATARACT EXTRACTION PHACO AND INTRAOCULAR LENS PLACEMENT (IOC);  Surgeon: Fabio Pierce, MD;  Location: [facility];  Service: Ophthalmology;  Laterality: Left;  CDE 9.41    COLONOSCOPY     IR VERTEBROPLASTY CERV/THOR BX INC UNI/BIL INC/INJECT/IMAGING  04/16/2021   Right foot toe surgery     UPPER GASTROINTESTINAL ENDOSCOPY     vocal cord     polyp removal    Family Psychiatric History: See below  Family History:  Family History  Problem Relation Age of Onset   Heart disease Mother    Heart attack Mother    Heart attack Father    Colon polyps Sister    Esophageal cancer Sister    Anxiety disorder Sister    Depression Sister    Colon polyps Sister    Heart attack Brother    Colon cancer Brother 77       died at age 56   Anxiety disorder Brother    Depression Brother    Alcohol abuse Brother    Colon polyps Brother    Colon polyps Brother    Drug abuse Son    Breast cancer Niece    Rectal cancer Neg Hx    Stomach cancer Neg Hx     Social History:  Social History   Socioeconomic History   Marital status: Single    Spouse name: Not on file   Number of children: 2    Years of education: Not on file   Highest education level: Not on file  Occupational History   Occupation: Conservation officer, 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   Sexual activity: Not on file  Other Topics Concern   Not on file  Social History Narrative   Retired/ lives alone in one level home   Son and daughter in law that live next door and help her out a lot         Social Drivers of Health   Financial Resource Strain: Medium Risk (04/16/2022)   Overall Financial Resource Strain (CARDIA)    Difficulty of Paying Living Expenses: Somewhat hard  Food Insecurity: No Food Insecurity (04/16/2022)   Hunger Vital Sign    Worried About Running Out of Food in the Last Year: Never true    Ran Out of Food in the Last Year: Never true  Transportation Needs: No Transportation Needs (04/16/2022)   PRAPARE - Administrator, Civil Service (Medical): No    Lack of Transportation (Non-Medical): No  Physical Activity: Inactive (04/16/2022)   Exercise Vital Sign    Days of Exercise per Week: 0 days    Minutes of Exercise per Session: 0 min  Stress: Stress Concern Present (04/16/2022)   [employer] of Occupational Health - Occupational Stress Questionnaire    Feeling of Stress : To some extent  Social Connections: Socially Isolated (04/16/2022)   Social Connection and Isolation Panel [NHANES]    Frequency of Communication with Friends and Family: More than three times a week    Frequency of Social Gatherings with  Friends and Family: More than three times a week    Attends Religious Services: Never    Database administrator or Organizations: No    Attends Banker Meetings: Never    Marital Status: Divorced    Allergies:  Allergies  Allergen Reactions   Fentanyl Other (See Comments)   Lorcet 10-650 [Hydrocodone-Acetaminophen] Itching   Sulfa Antibiotics Other (See Comments)    aching   Tape Other (See Comments)    Tear skin. Please use paper tape   Wellbutrin Xl [Bupropion Hcl Er (Xl)]     Upset stomach /bad taste in mouth    Metabolic Disorder Labs: Lab Results  Component Value Date   HGBA1C 5.9 (H) 07/12/2013   MPG 123 (H) 07/12/2013   No results found for: "PROLACTIN" Lab Results  Component Value Date   CHOL 165 12/17/2022   TRIG 191 (H) 12/17/2022   HDL 40 12/17/2022   CHOLHDL 4.1 12/17/2022   VLDL 36 07/13/2013   LDLCALC 92 12/17/2022   LDLCALC 112 (H) 06/17/2022   Lab Results  Component Value Date   TSH 1.820 05/25/2017   TSH 1.080 05/17/2015    Therapeutic Level Labs: No results found for: "LITHIUM" No results found for: "VALPROATE" No results found for: "CBMZ"  Current Medications: Current Outpatient Medications  Medication Sig Dispense Refill   acetaminophen (TYLENOL) 500 MG tablet Take 1,000 mg by mouth every 6 (six) hours as needed for moderate pain.     albuterol (VENTOLIN HFA) 108 (90 Base) MCG/ACT inhaler Inhale 2 puffs into the lungs every 6 (six) hours as needed for wheezing or shortness of breath. 8 g 5   alendronate (FOSAMAX) 70 MG tablet Take 1 tablet (70 mg total) by mouth every 7 (seven) days. Take with a full glass of water on an empty stomach. Do not lay down for at least 2 hours 13 tablet 3  amLODipine (NORVASC) 10 MG tablet Take 1 tablet (10 mg total) by mouth daily. 90 tablet 3   atorvastatin (LIPITOR) 80 MG tablet Take 1 tablet (80 mg total) by mouth daily. 90 tablet 3   clonazePAM (KLONOPIN) 1 MG tablet Take 1 tablet (1 mg total)  by mouth 3 (three) times daily as needed. for anxiety 90 tablet 2   dexlansoprazole (DEXILANT) 60 MG capsule TAKE 1 CAPSULE BY MOUTH DAILY ON AN EMPTY STOMACH 30 capsule 0   DULoxetine (CYMBALTA) 60 MG capsule Take 1 capsule (60 mg total) by mouth 2 (two) times daily. 180 capsule 3   gabapentin (NEURONTIN) 600 MG tablet Take 600 mg by mouth 3 (three) times daily.     ipratropium-albuterol (DUONEB) 0.5-2.5 (3) MG/3ML SOLN Take 3 mLs by nebulization every 6 (six) hours as needed. 360 mL 5   levocetirizine (XYZAL) 5 MG tablet Take 1 tablet (5 mg total) by mouth every evening. 90 tablet 3   linaclotide (LINZESS) 145 MCG CAPS capsule Take 1 capsule by mouth daily to regulate bowel movement 30 capsule 0   lisinopril (ZESTRIL) 40 MG tablet Take 1 tablet (40 mg total) by mouth daily. 90 tablet 3   methocarbamol (ROBAXIN) 750 MG tablet Take 750 mg by mouth every 6 (six) hours as needed.     naproxen sodium (ALEVE) 220 MG tablet Take 220 mg by mouth 2 (two) times daily as needed (pain).     oxycodone-acetaminophen (PERCOCET) 2.5-325 MG tablet Take 1 tablet by mouth every 12 (twelve) hours as needed for pain. 60 tablet 0   rOPINIRole (REQUIP) 3 MG tablet Take 1 tablet (3 mg total) by mouth at bedtime. 90 tablet 3   SYMBICORT 160-4.5 MCG/ACT inhaler Inhale 2 puffs into the lungs 2 (two) times daily. 1 each 12   Tiotropium Bromide Monohydrate (SPIRIVA RESPIMAT) 2.5 MCG/ACT AERS INHALE 2 puffs BY MOUTH DAILY 24 g 2   Vitamin D, Ergocalciferol, (DRISDOL) 1.25 MG (50000 UNIT) CAPS capsule Take 1 capsule (50,000 Units total) by mouth every 7 (seven) days. 13 capsule 3   No current facility-administered medications for this visit.     Musculoskeletal: Strength & Muscle Tone: na Gait & Station: na Patient leans: N/A  Psychiatric Specialty Exam: Review of Systems  Musculoskeletal:  Positive for arthralgias and joint swelling.  All other systems reviewed and are negative.   There were no vitals taken for  this visit.There is no height or weight on file to calculate BMI.  General Appearance: NA  Eye Contact:  NA  Speech: Normal  Volume:  Normal  Mood:  Euthymic  Affect:  NA  Thought Process:  Goal Directed  Orientation:  Full (Time, Place, and Person)  Thought Content: Rumination   Suicidal Thoughts:  No  Homicidal Thoughts:  No  Memory:  Immediate;   Good Recent;   Good Remote;   Fair  Judgement:  Good  Insight:  Fair  Psychomotor Activity:  Decreased  Concentration:  Concentration: Good and Attention Span: Good  Recall:  Good  Fund of Knowledge: Good  Language: Good  Akathisia:  No  Handed:  Right  AIMS (if indicated): not done  Assets:  Communication Skills Desire for Improvement Resilience  ADL's:  Intact  Cognition: WNL  Sleep:  Good   Screenings: GAD-7    Flowsheet Row Office Visit from 12/17/2022 in [facility] Office Visit from 08/20/2022 in [facility] Office Visit from 06/17/2022 in [facility] Office Visit from 12/16/2021 in [facility] Office Visit from 06/17/2021 in [facility]  Total GAD-7 Score 13 14 9 16 5       PHQ2-9    Flowsheet Row Office Visit from 02/12/2023 in [facility] Office Visit from 01/15/2023 in [facility] Office Visit from 12/17/2022 in [facility] Office Visit from 08/20/2022 in [facility] Office Visit from 06/17/2022 in  [facility]  PHQ-2 Total Score 0 4 3 4 2   PHQ-9 Total Score -- 12 16 14 4       Flowsheet Row Video Visit from 10/21/2021 in [facility] [place] Video Visit from 07/22/2021 in [facility] [place] Admission  (Discharged) from 05/16/2021 in [place] [facility]IOPERATIVE ARE[facility]  C-SSRS RISK CATEGORY No Risk No Risk No Risk        Assessment and Plan: This patient is a 68 year old female with a history of depression and anxiety.  For the most part she has been doing okay.  She will continue Cymbalta 60 mg twice daily for depression and clonazepam 1 mg 3 times daily for anxiety.  She will return to see me in 3 months  Collaboration of Care: Collaboration of Care: Primary Care Provider AEB notes are shared with PCP through the epic system  Patient/Guardian was advised Release of Information must be obtained prior to any record release in order to collaborate their care with an outside provider. Patient/Guardian was advised if they have not already done so to contact the registration department to sign all necessary forms in order for us  to release information regarding their care.   Consent: Patient/Guardian gives verbal consent for treatment and assignment of benefits for services provided during this visit. Patient/Guardian expressed understanding and agreed to proceed.    Alfredia Annas, MD 06/15/2023, 3:15 PM

## 2023-06-23 ENCOUNTER — Ambulatory Visit (INDEPENDENT_AMBULATORY_CARE_PROVIDER_SITE_OTHER): Admitting: Family Medicine

## 2023-06-23 ENCOUNTER — Other Ambulatory Visit (HOSPITAL_COMMUNITY): Payer: Self-pay | Admitting: Psychiatry

## 2023-06-23 ENCOUNTER — Encounter: Payer: Self-pay | Admitting: Family Medicine

## 2023-06-23 VITALS — BP 128/80 | HR 79 | Temp 98.3°F | Ht 65.0 in | Wt 139.0 lb

## 2023-06-23 DIAGNOSIS — K2100 Gastro-esophageal reflux disease with esophagitis, without bleeding: Secondary | ICD-10-CM

## 2023-06-23 DIAGNOSIS — K5903 Drug induced constipation: Secondary | ICD-10-CM | POA: Diagnosis not present

## 2023-06-23 DIAGNOSIS — E782 Mixed hyperlipidemia: Secondary | ICD-10-CM

## 2023-06-23 DIAGNOSIS — R42 Dizziness and giddiness: Secondary | ICD-10-CM

## 2023-06-23 DIAGNOSIS — I10 Essential (primary) hypertension: Secondary | ICD-10-CM | POA: Diagnosis not present

## 2023-06-23 DIAGNOSIS — M6281 Muscle weakness (generalized): Secondary | ICD-10-CM | POA: Diagnosis not present

## 2023-06-23 DIAGNOSIS — G20B2 Parkinson's disease with dyskinesia, with fluctuations: Secondary | ICD-10-CM | POA: Diagnosis not present

## 2023-06-23 LAB — LIPID PANEL

## 2023-06-23 MED ORDER — LINACLOTIDE 145 MCG PO CAPS: ORAL_CAPSULE | ORAL | 0 refills | Status: DC

## 2023-06-23 MED ORDER — DEXLANSOPRAZOLE 60 MG PO CPDR
DELAYED_RELEASE_CAPSULE | ORAL | 0 refills | Status: DC
Start: 2023-06-23 — End: 2023-08-04

## 2023-06-23 MED ORDER — CARBIDOPA-LEVODOPA 10-100 MG PO TABS: 1.0000 | ORAL_TABLET | Freq: Three times a day (TID) | ORAL | 5 refills | Status: DC

## 2023-06-23 MED ORDER — MECLIZINE HCL 25 MG PO TABS: 25.0000 mg | ORAL_TABLET | Freq: Three times a day (TID) | ORAL | 2 refills | Status: DC | PRN

## 2023-06-23 NOTE — Progress Notes (Signed)
 Subjective:  Patient ID: [name], female    DOB: 08-12-1955  Age: 68 y.o. MRN: [id number]  CC: Medication Refill (pended), parkinsons (Would like to discuss dx), and Dizziness (On going for months. Pt feels off  balance a lot of the time. //Seeing neurology/Mentioned ongoing headaches as well)   HPI [name] presents for shaking spels, dizzy, poor balance. HA. Things spin around. Lasts 3-4 min. Occurring BID.  presents for  follow-up of hypertension. Patient has no history of headache chest pain or shortness of breath or recent cough. Patient also denies symptoms of TIA such as focal numbness or weakness. Patient denies side effects from medication. States taking it regularly.  in for follow-up of elevated cholesterol. Doing well without complaints on current medication. Denies side effects of statin including myalgia and arthralgia and nausea. Currently no chest pain, shortness of breath or other cardiovascular related symptoms noted.  Patient in for follow-up of GERD. Currently asymptomatic taking  PPI daily. There is no chest pain or heartburn. No hematemesis and no melena. No dysphagia or choking. Onset is remote. Progression is stable. Complicating factors, none.         02/12/2023   12:06 PM 01/15/2023    1:25 PM 12/17/2022    2:03 PM  Depression screen PHQ 2/9  Decreased Interest 0 3 2  Down, Depressed, Hopeless 0 1 1  PHQ - 2 Score 0 4 3  Altered sleeping  2 2  Tired, decreased energy  3 3  Change in appetite  2 3  Feeling bad or failure about yourself   0 1  Trouble concentrating  1 2  Moving slowly or fidgety/restless  0 2  Suicidal thoughts  0 0  PHQ-9 Score  12 16  Difficult doing work/chores  Very difficult Very difficult    History [name] has a past medical history of Allergy, Arthritis, Atypical mole (06/05/2003), Atypical nevus (07/26/2003), Basal cell carcinoma (06/05/2003), Basal cell carcinoma (05/22/2004), Basal cell carcinoma (11/27/2004), Basal  cell carcinoma (11/08/2009), Basal cell carcinoma (05/24/2012), Basal cell carcinoma (06/24/2012), Basal cell carcinoma (07/29/2012), Basal cell carcinoma (06/06/2019), Cataract, Closed compression fracture of L5 vertebra (HCC), Constipation, COPD (chronic obstructive pulmonary disease) (HCC), Coronary artery disease, COVID, Depression, GERD (gastroesophageal reflux disease), Heart murmur, adenomatous polyp of colon (10/03/2014), Hypercholesteremia, Hypertension, Panic attacks, Perimenopausal, Skin cancer, Squamous cell carcinoma of skin (06/05/2003), Squamous cell carcinoma of skin (05/22/2004), Squamous cell carcinoma of skin (11/27/2004), Squamous cell carcinoma of skin (03/04/2006), Squamous cell carcinoma of skin (11/26/2006), Squamous cell carcinoma of skin (05/24/2012), Squamous cell carcinoma of skin (04/05/2013), Squamous cell carcinoma of skin (08/17/2013), Squamous cell carcinoma of skin (07/30/2016), Squamous cell carcinoma of skin (05/12/2017), Squamous cell carcinoma of skin (11/23/2018), and Tobacco user.   She has a past surgical history that includes Abdominal hysterectomy; vocal cord; Right foot toe surgery; Colonoscopy; Upper gastrointestinal endoscopy; Cataract extraction w/PHACO (Right, 03/01/2021); IR VERTEBROPLASTY CERV/THOR BX INC UNI/BIL INC/INJECT/IMAGING (04/16/2021); and Cataract extraction w/PHACO (Left, 05/16/2021).   Her family history includes Alcohol abuse in her brother; Anxiety disorder in her brother and sister; Breast cancer in her niece; Colon cancer (age of onset: 44) in her brother; Colon polyps in her brother, brother, sister, and sister; Depression in her brother and sister; Drug abuse in her son; Esophageal cancer in her sister; Heart attack in her brother, father, and mother; Heart disease in her mother.She reports that she has been smoking cigarettes. She started smoking about 54 years ago. She has a 54.1  pack-year smoking history. She has never used smokeless tobacco.  She reports that she does not drink alcohol and does not use drugs.    ROS Review of Systems  Constitutional: Negative.   HENT: Negative.    Eyes:  Negative for visual disturbance.  Respiratory:  Negative for shortness of breath.   Cardiovascular:  Negative for chest pain.  Gastrointestinal:  Positive for constipation. Negative for abdominal pain.  Musculoskeletal:  Positive for arthralgias.  Neurological:  Positive for tremors.    Objective:  BP 128/80   Pulse 79   Temp 98.3 F (36.8 C)   Ht 5\' 5"  (1.651 m)   Wt 139 lb (63 kg)   SpO2 95%   BMI 23.13 kg/m   BP Readings from Last 3 Encounters:  06/23/23 128/80  02/12/23 126/82  01/15/23 (!) 144/88    Wt Readings from Last 3 Encounters:  06/23/23 139 lb (63 kg)  02/12/23 139 lb (63 kg)  01/15/23 140 lb (63.5 kg)     Physical Exam Constitutional:      General: She is not in acute distress.    Appearance: She is well-developed.  Cardiovascular:     Rate and Rhythm: Normal rate and regular rhythm.  Pulmonary:     Breath sounds: Normal breath sounds.  Musculoskeletal:        General: Normal range of motion.  Skin:    General: Skin is warm and dry.  Neurological:     Mental Status: She is alert and oriented to person, place, and time.     Comments: Pt. Has marked rest tremor at hands. Positive for cogwheeling rigidity.        Assessment & Plan:  Parkinson's disease with dyskinesia and fluctuating manifestations (HCC)  Gastroesophageal reflux disease with esophagitis without hemorrhage -     Dexlansoprazole ; TAKE 1 CAPSULE BY MOUTH DAILY ON AN EMPTY STOMACH  Dispense: 30 capsule; Refill: 0 -     CBC with Differential/Platelet -     CMP14+EGFR  Drug-induced constipation -     linaCLOtide ; Take 1 capsule by mouth daily to regulate bowel movement  Dispense: 30 capsule; Refill: 0 -     CBC with Differential/Platelet -     CMP14+EGFR  Essential hypertension -     CBC with Differential/Platelet -      CMP14+EGFR  Mixed hyperlipidemia -     CMP14+EGFR -     Lipid panel  Vertigo  Other orders -     Carbidopa -Levodopa ; Take 1 tablet by mouth 3 (three) times daily. For parkinsonism  Dispense: 90 tablet; Refill: 5 -     Meclizine  HCl; Take 1 tablet (25 mg total) by mouth 3 (three) times daily as needed for dizziness.  Dispense: 60 tablet; Refill: 2     Follow-up: Return in about 6 weeks (around 08/04/2023).  Roise Cleaver, M.D.

## 2023-06-24 ENCOUNTER — Encounter: Payer: Self-pay | Admitting: Family Medicine

## 2023-06-24 DIAGNOSIS — L2989 Other pruritus: Secondary | ICD-10-CM | POA: Diagnosis not present

## 2023-06-24 DIAGNOSIS — R208 Other disturbances of skin sensation: Secondary | ICD-10-CM | POA: Diagnosis not present

## 2023-06-24 DIAGNOSIS — I872 Venous insufficiency (chronic) (peripheral): Secondary | ICD-10-CM | POA: Diagnosis not present

## 2023-06-24 DIAGNOSIS — Z789 Other specified health status: Secondary | ICD-10-CM | POA: Diagnosis not present

## 2023-06-24 DIAGNOSIS — L820 Inflamed seborrheic keratosis: Secondary | ICD-10-CM | POA: Diagnosis not present

## 2023-06-24 DIAGNOSIS — D485 Neoplasm of uncertain behavior of skin: Secondary | ICD-10-CM | POA: Diagnosis not present

## 2023-06-24 DIAGNOSIS — D0471 Carcinoma in situ of skin of right lower limb, including hip: Secondary | ICD-10-CM | POA: Diagnosis not present

## 2023-06-24 DIAGNOSIS — D0472 Carcinoma in situ of skin of left lower limb, including hip: Secondary | ICD-10-CM | POA: Diagnosis not present

## 2023-06-24 DIAGNOSIS — L538 Other specified erythematous conditions: Secondary | ICD-10-CM | POA: Diagnosis not present

## 2023-06-24 LAB — CBC WITH DIFFERENTIAL/PLATELET
Basophils Absolute: 0 10*3/uL (ref 0.0–0.2)
Basos: 0 %
EOS (ABSOLUTE): 0.1 10*3/uL (ref 0.0–0.4)
Eos: 1 %
Hematocrit: 47.5 % — ABNORMAL HIGH (ref 34.0–46.6)
Hemoglobin: 15 g/dL (ref 11.1–15.9)
Immature Grans (Abs): 0 10*3/uL (ref 0.0–0.1)
Immature Granulocytes: 0 %
Lymphocytes Absolute: 2.2 10*3/uL (ref 0.7–3.1)
Lymphs: 22 %
MCH: 30.2 pg (ref 26.6–33.0)
MCHC: 31.6 g/dL (ref 31.5–35.7)
MCV: 96 fL (ref 79–97)
Monocytes Absolute: 0.6 10*3/uL (ref 0.1–0.9)
Monocytes: 6 %
Neutrophils Absolute: 7.1 10*3/uL — ABNORMAL HIGH (ref 1.4–7.0)
Neutrophils: 71 %
Platelets: 408 10*3/uL (ref 150–450)
RBC: 4.97 x10E6/uL (ref 3.77–5.28)
RDW: 14.4 % (ref 11.7–15.4)
WBC: 10.1 10*3/uL (ref 3.4–10.8)

## 2023-06-24 LAB — CMP14+EGFR
ALT: 13 IU/L (ref 0–32)
AST: 12 IU/L (ref 0–40)
Albumin: 3.9 g/dL (ref 3.9–4.9)
Alkaline Phosphatase: 91 IU/L (ref 44–121)
BUN/Creatinine Ratio: 15 (ref 12–28)
BUN: 10 mg/dL (ref 8–27)
Bilirubin Total: 0.2 mg/dL (ref 0.0–1.2)
CO2: 26 mmol/L (ref 20–29)
Calcium: 9.3 mg/dL (ref 8.7–10.3)
Chloride: 105 mmol/L (ref 96–106)
Creatinine, Ser: 0.68 mg/dL (ref 0.57–1.00)
Globulin, Total: 2.5 g/dL (ref 1.5–4.5)
Glucose: 95 mg/dL (ref 70–99)
Potassium: 4.4 mmol/L (ref 3.5–5.2)
Sodium: 143 mmol/L (ref 134–144)
Total Protein: 6.4 g/dL (ref 6.0–8.5)
eGFR: 94 mL/min/{1.73_m2} (ref >59)

## 2023-06-24 LAB — LIPID PANEL
Cholesterol, Total: 145 mg/dL (ref 100–199)
HDL: 43 mg/dL (ref >39)
LDL CALC COMMENT:: 3.4 ratio (ref 0.0–4.4)
LDL Chol Calc (NIH): 79 mg/dL (ref 0–99)
Triglycerides: 130 mg/dL (ref 0–149)
VLDL Cholesterol Cal: 23 mg/dL (ref 5–40)

## 2023-06-24 NOTE — Progress Notes (Signed)
Hello [place]ifa, ? ?Your lab result is normal and/or stable.Some minor variations that are not significant are commonly marked abnormal, but do not represent any medical problem for you. ? ?Best regards, ?Bobie Caris, M.D.

## 2023-07-01 DIAGNOSIS — G894 Chronic pain syndrome: Secondary | ICD-10-CM | POA: Diagnosis not present

## 2023-07-05 ENCOUNTER — Other Ambulatory Visit: Payer: Self-pay | Admitting: Family Medicine

## 2023-07-05 DIAGNOSIS — R251 Tremor, unspecified: Secondary | ICD-10-CM

## 2023-07-07 ENCOUNTER — Ambulatory Visit (HOSPITAL_COMMUNITY)

## 2023-07-13 ENCOUNTER — Ambulatory Visit (HOSPITAL_COMMUNITY)
Admission: RE | Admit: 2023-07-13 | Discharge: 2023-07-13 | Disposition: A | Discharge status: Home / Self Care | Source: Ambulatory Visit | Attending: Acute Care | Admitting: Acute Care

## 2023-07-13 DIAGNOSIS — I700 Atherosclerosis of aorta: Secondary | ICD-10-CM | POA: Diagnosis not present

## 2023-07-13 DIAGNOSIS — R911 Solitary pulmonary nodule: Secondary | ICD-10-CM

## 2023-07-13 DIAGNOSIS — J432 Centrilobular emphysema: Secondary | ICD-10-CM | POA: Diagnosis not present

## 2023-07-15 ENCOUNTER — Other Ambulatory Visit (HOSPITAL_COMMUNITY): Payer: Self-pay | Admitting: Psychiatry

## 2023-07-22 DIAGNOSIS — M47816 Spondylosis without myelopathy or radiculopathy, lumbar region: Secondary | ICD-10-CM | POA: Diagnosis not present

## 2023-07-23 DIAGNOSIS — M6281 Muscle weakness (generalized): Secondary | ICD-10-CM | POA: Diagnosis not present

## 2023-07-24 ENCOUNTER — Ambulatory Visit: Attending: Medical | Admitting: Medical

## 2023-07-24 ENCOUNTER — Encounter: Payer: Self-pay | Admitting: Medical

## 2023-07-24 VITALS — BP 122/70 | HR 84 | Ht 65.0 in | Wt 140.8 lb

## 2023-07-24 DIAGNOSIS — R0789 Other chest pain: Secondary | ICD-10-CM

## 2023-07-24 DIAGNOSIS — E782 Mixed hyperlipidemia: Secondary | ICD-10-CM

## 2023-07-24 DIAGNOSIS — I350 Nonrheumatic aortic (valve) stenosis: Secondary | ICD-10-CM

## 2023-07-24 DIAGNOSIS — I2510 Atherosclerotic heart disease of native coronary artery without angina pectoris: Secondary | ICD-10-CM | POA: Diagnosis not present

## 2023-07-24 DIAGNOSIS — I10 Essential (primary) hypertension: Secondary | ICD-10-CM

## 2023-07-24 MED ORDER — ATORVASTATIN CALCIUM 80 MG PO TABS: 80.0000 mg | ORAL_TABLET | Freq: Every day | ORAL | 3 refills | Status: DC

## 2023-07-24 MED ORDER — LISINOPRIL 40 MG PO TABS: 40.0000 mg | ORAL_TABLET | Freq: Every day | ORAL | 3 refills | Status: AC

## 2023-07-24 MED ORDER — AMLODIPINE BESYLATE 10 MG PO TABS: 10.0000 mg | ORAL_TABLET | Freq: Every day | ORAL | 3 refills | Status: AC

## 2023-07-24 MED ORDER — EZETIMIBE 10 MG PO TABS: 10.0000 mg | ORAL_TABLET | Freq: Every day | ORAL | 3 refills | Status: DC

## 2023-07-24 NOTE — Patient Instructions (Signed)
 Medication Instructions:   Your physician has recommended you make the following change in your medication:   Start Zetia 10 mg daily   *If you need a refill on your cardiac medications before your next appointment, please call your pharmacy*  Lab Work: NONE   If you have labs (blood work) drawn today and your tests are completely normal, you will receive your results only by: MyChart Message (if you have MyChart) OR A paper copy in the mail If you have any lab test that is abnormal or we need to change your treatment, we will call you to review the results.  Testing/Procedures: Your physician has requested that you have an echocardiogram. Echocardiography is a painless test that uses sound waves to create images of your heart. It provides your doctor with information about the size and shape of your heart and how well your heart's chambers and valves are working. This procedure takes approximately one hour. There are no restrictions for this procedure. Please do NOT wear cologne, perfume, aftershave, or lotions (deodorant is allowed). Please arrive 15 minutes prior to your appointment time.  Please note: We ask at that you not bring children with you during ultrasound (echo/ vascular) testing. Due to room size and safety concerns, children are not allowed in the ultrasound rooms during exams. Our front office staff cannot provide observation of children in our lobby area while testing is being conducted. An adult accompanying a patient to their appointment will only be allowed in the ultrasound room at the discretion of the ultrasound technician under special circumstances. We apologize for any inconvenience.   Follow-Up: At [facility], you and your health needs are our priority.  As part of our continuing mission to provide you with exceptional heart care, our providers are all part of one team.  This team includes your primary Cardiologist (physician) and Advanced Practice  Providers or APPs (Physician Assistants and Nurse Practitioners) who all work together to provide you with the care you need, when you need it.  Your next appointment:   6 month(s)  Provider:   You may see Armida Lander, MD or one of the following Advanced Practice Providers on your designated Care Team:   Woodfin Hays, PA-C  Scotesia Norman, [place] Theotis Flake, [place]     We recommend signing up for the patient portal called "MyChart".  Sign up information is provided on this After Visit Summary.  MyChart is used to connect with patients for Virtual Visits (Telemedicine).  Patients are able to view lab/test results, encounter notes, upcoming appointments, etc.  Non-urgent messages can be sent to your provider as well.   To learn more about what you can do with MyChart, go to [email or web address].   Other Instructions Thank you for choosing  [facility]!

## 2023-07-24 NOTE — Progress Notes (Signed)
 Cardiology Office Note:  .   Date:  07/24/2023  ID:  [name], DOB [birth year], MRN [id number] PCP: Roise Cleaver, MD  [facility] Providers Cardiologist:  Armida Lander, MD {  History of Present Illness: .   [name] is a 68 y.o. female with h/o atypical chest pain/CAD, COPD, HTN, HLD, Aortic stenosis, anxiety who presents for follow-up.   The patient has a long history of chest pain. Cath in 2008 showed nonobstructive CAD. NST in 2015 showed no ischemia. CT for lung cancer screen in 01/2018 showed aortic atherosclerosis, LM disease, LAD, and Lcx. NST in 2020 showed no ischemia.   She was last seen in 11/2022 and was stable from a cardiac perspective. Echo in 12/2022 showed LVEF 70-75%, no Wma, G1DD, mild calcification of aortic valve, moderate aortic valve stenosis, mean gradient peak gradient measures 39.2 mmHg.  Today, she says she was diagnosed with parkinson's by PCP. Aaron Aas She has skin cancer and will get it resected in the next few months. She has occasional chest pressure that is worse after she eats. Breathing is not good, which is from smoking and COPD. She has vertigo occasionally. No lower leg edema   Studies Reviewed: .        Echo 12/2022   1. Left ventricular ejection fraction, by estimation, is 70 to 75%. The  left ventricle has hyperdynamic function. The left ventricle has no  regional wall motion abnormalities. Left ventricular diastolic parameters  are consistent with Grade I diastolic  dysfunction (impaired relaxation).   2. Right ventricular systolic function is normal. The right ventricular  size is normal.   3. The mitral valve is normal in structure. No evidence of mitral valve  regurgitation. No evidence of mitral stenosis.   4. The aortic valve was not well visualized. There is mild calcification  of the aortic valve. There is mild thickening of the aortic valve. Aortic  valve regurgitation is not visualized. Moderate aortic valve  stenosis.  Aortic valve mean gradient measures   22.0 mmHg. Aortic valve peak gradient measures 39.2 mmHg. Aortic valve  area, by VTI measures 1.27 cm.   Comparison(s): EF 60-65%. Mild aortic stenosis-Mean 12.0 mmHg.   Echo 01/2020 1. Left ventricular ejection fraction, by estimation, is 60 to 65%. The  left ventricle has normal function. The left ventricle has no regional  wall motion abnormalities. Left ventricular diastolic parameters are  consistent with Grade I diastolic  dysfunction (impaired relaxation).   2. Right ventricular systolic function is normal. The right ventricular  size is normal.   3. The mitral valve is normal in structure. No evidence of mitral valve  regurgitation. No evidence of mitral stenosis.   4. The aortic valve has an indeterminant number of cusps. There is mild  calcification of the aortic valve. There is mild thickening of the aortic  valve. Aortic valve regurgitation is mild. Mild aortic valve stenosis.  Mild aortic stenosis is present.  Aortic valve mean gradient measures 12.0 mmHg. Aortic valve peak gradient  measures 23.1 mmHg. Aortic valve area, by VTI measures 1.69 cm.   5. The inferior vena cava is normal in size with greater than 50%  respiratory variability, suggesting right atrial pressure of 3 mmHg.   Comparison(s): Echocardiogram done 07/13/13 showed an EF of 60-65%.   Myoview  lexiscan  03/2018 Narrative & Impression  There was no ST segment deviation noted during stress. Defect 1: There is a medium defect of mild severity present in the basal  inferior, mid inferior and apical inferior location. This is likely due to soft tissue attenuation. No evidence of ischemia. This is a low risk study. Nuclear stress EF: 58%.       Physical Exam:   VS:  There were no vitals taken for this visit.   Wt Readings from Last 3 Encounters:  06/23/23 139 lb (63 kg)  02/12/23 139 lb (63 kg)  01/15/23 140 lb (63.5 kg)    GEN: Well nourished, well  developed in no acute distress NECK: No JVD; No carotid bruits CARDIAC: RRR, + systolic 3/6 murmur, no rubs, gallops RESPIRATORY:  diffusely diminished ABDOMEN: Soft, non-tender, non-distended EXTREMITIES:  No edema; No deformity   ASSESSMENT AND PLAN: .    Atypical chest pain CAD She describes atypical chest pain, likely from GERD. Long history of chronic atypical chest pain. She has chronic SOB from COPD/smoking. NST in 2020 with no ischemia. Echo in 2024 showed LVEF 70-75%, G1DD, no WMA. No further ischemic work-up at this time. Continue Lipitor 80mg  daily.   Aortic stenosis Echo 12/2022 showed LVEF 70-75% with moderate AS, mean gradient , VTI 1.27cm2. Repeat in 6 months.   HTN BP is normal today, continue lisinopril  40mg  daily and amlodipine  10mg  daily.   HLD LDL 79. Continue Lipitor 80mg  daily. Will add on Zetia  10mg  daily.        Dispo: Follow-up in 6 months  Signed, Zelig Gacek Rebekah Canada, PA-C

## 2023-07-29 ENCOUNTER — Ambulatory Visit: Admitting: Dermatology

## 2023-07-30 ENCOUNTER — Telehealth: Payer: Self-pay | Admitting: Family Medicine

## 2023-07-30 NOTE — Telephone Encounter (Signed)
 Copied from CRM [id number]. Topic: Clinical - Order For Equipment >> Jul 29, 2023  5:11 PM Tiffany H wrote: Reason for CRM: [facility] called to request Certificate of Medical Necessity, Letter of Medical Necessity and Office Visit notes for incontinence supplies. Francena Infield advised that she sent us  a fax on 07/23/23 to verified line. Please check inbox.   Francena Infield will send fax again today. Please assist.   Phone: [phone] Fax: [phone]

## 2023-07-30 NOTE — Telephone Encounter (Signed)
 Fax has been received, pt has an appt on 08/05/23, there is no previous OV notes regarding this.

## 2023-08-04 ENCOUNTER — Other Ambulatory Visit (HOSPITAL_COMMUNITY): Payer: Self-pay | Admitting: Psychiatry

## 2023-08-04 ENCOUNTER — Other Ambulatory Visit: Payer: Self-pay | Admitting: Family Medicine

## 2023-08-04 DIAGNOSIS — F411 Generalized anxiety disorder: Secondary | ICD-10-CM

## 2023-08-04 DIAGNOSIS — K2100 Gastro-esophageal reflux disease with esophagitis, without bleeding: Secondary | ICD-10-CM

## 2023-08-04 DIAGNOSIS — K5903 Drug induced constipation: Secondary | ICD-10-CM

## 2023-08-05 ENCOUNTER — Telehealth: Payer: Self-pay | Admitting: Acute Care

## 2023-08-05 ENCOUNTER — Encounter: Payer: Self-pay | Admitting: Family Medicine

## 2023-08-05 ENCOUNTER — Ambulatory Visit: Admitting: Family Medicine

## 2023-08-05 VITALS — BP 113/70 | HR 84 | Temp 97.2°F | Ht 65.0 in | Wt 138.0 lb

## 2023-08-05 DIAGNOSIS — R911 Solitary pulmonary nodule: Secondary | ICD-10-CM

## 2023-08-05 DIAGNOSIS — J449 Chronic obstructive pulmonary disease, unspecified: Secondary | ICD-10-CM | POA: Diagnosis not present

## 2023-08-05 DIAGNOSIS — R32 Unspecified urinary incontinence: Secondary | ICD-10-CM

## 2023-08-05 DIAGNOSIS — R151 Fecal smearing: Secondary | ICD-10-CM

## 2023-08-05 DIAGNOSIS — E782 Mixed hyperlipidemia: Secondary | ICD-10-CM

## 2023-08-05 DIAGNOSIS — I10 Essential (primary) hypertension: Secondary | ICD-10-CM | POA: Diagnosis not present

## 2023-08-05 MED ORDER — EZETIMIBE 10 MG PO TABS: 10.0000 mg | ORAL_TABLET | Freq: Every day | ORAL | 3 refills | Status: AC

## 2023-08-05 MED ORDER — ATORVASTATIN CALCIUM 80 MG PO TABS: 80.0000 mg | ORAL_TABLET | Freq: Every day | ORAL | 3 refills | Status: AC

## 2023-08-05 NOTE — Telephone Encounter (Signed)
 Kerry Ear NP has reviewed the results. Patient has had a previous PET scan that did not show significant uptake on the concerning nodule. The nodule has grown from 11.50mm to 14.8/3.47mm. I called the pt and informed her of the results and the nodule growth. We also discussed tissue biopsy vs 3 month follow up scan. Patient states currently she would like to do the 3 month follow up scan to re-assess and then go from there. Order placed for 3 month scan. Patient states she has had some recent chest congestion. Offered pt an appt with APP for 6/6. Patient last seen by Dr. Waylan Haggard in 11/2022. Patient states she is unable to schedule that soon due to transportation. Patient was just seen by PCP today. I advised pt to contact the PCP office in the morning. Patient aware to contact our office back if needed for symptoms. Patient verbalized understanding. Will send to PCP as FYI.

## 2023-08-05 NOTE — Progress Notes (Signed)
 Subjective:  Patient ID: [name], female    DOB: 1955/07/07  Age: 68 y.o. MRN: [id number]  CC: supplies (Wipes are out. Needs all medical supplies filled. Wasn't able to provider where she gets them from but come through mail.)   HPI [name] presents for refill of incontinence supplies. pull ups, needs 9 packs, wipes , needs four packs a month. Needs 3 boxes of gloves. She is incontinent of urine and partially of stool with fairly frequnt leakage. They don't send enough wipes due to the stool component.    presents for  follow-up of hypertension. Patient has no history of headache chest pain or shortness of breath or recent cough. Patient also denies symptoms of TIA such as focal numbness or weakness. Patient denies side effects from medication. States taking it regularly.  Still smoking. Declines to quit at this time.      08/05/2023   11:14 AM 02/12/2023   12:06 PM 01/15/2023    1:25 PM  Depression screen PHQ 2/9  Decreased Interest 2 0 3  Down, Depressed, Hopeless 2 0 1  PHQ - 2 Score 4 0 4  Altered sleeping 2  2  Tired, decreased energy 2  3  Change in appetite 2  2  Feeling bad or failure about yourself  1  0  Trouble concentrating 1  1  Moving slowly or fidgety/restless 0  0  Suicidal thoughts 0  0  PHQ-9 Score 12  12  Difficult doing work/chores Somewhat difficult  Very difficult    History [name] has a past medical history of Allergy, Arthritis, Atypical mole (06/05/2003), Atypical nevus (07/26/2003), Basal cell carcinoma (06/05/2003), Basal cell carcinoma (05/22/2004), Basal cell carcinoma (11/27/2004), Basal cell carcinoma (11/08/2009), Basal cell carcinoma (05/24/2012), Basal cell carcinoma (06/24/2012), Basal cell carcinoma (07/29/2012), Basal cell carcinoma (06/06/2019), Cataract, Closed compression fracture of L5 vertebra (HCC), Constipation, COPD (chronic obstructive pulmonary disease) (HCC), Coronary artery disease, COVID, Depression, GERD  (gastroesophageal reflux disease), Heart murmur, adenomatous polyp of colon (10/03/2014), Hypercholesteremia, Hypertension, Panic attacks, Perimenopausal, Skin cancer, Squamous cell carcinoma of skin (06/05/2003), Squamous cell carcinoma of skin (05/22/2004), Squamous cell carcinoma of skin (11/27/2004), Squamous cell carcinoma of skin (03/04/2006), Squamous cell carcinoma of skin (11/26/2006), Squamous cell carcinoma of skin (05/24/2012), Squamous cell carcinoma of skin (04/05/2013), Squamous cell carcinoma of skin (08/17/2013), Squamous cell carcinoma of skin (07/30/2016), Squamous cell carcinoma of skin (05/12/2017), Squamous cell carcinoma of skin (11/23/2018), and Tobacco user.   She has a past surgical history that includes Abdominal hysterectomy; vocal cord; Right foot toe surgery; Colonoscopy; Upper gastrointestinal endoscopy; Cataract extraction w/PHACO (Right, 03/01/2021); IR VERTEBROPLASTY CERV/THOR BX INC UNI/BIL INC/INJECT/IMAGING (04/16/2021); and Cataract extraction w/PHACO (Left, 05/16/2021).   Her family history includes Alcohol abuse in her brother; Anxiety disorder in her brother and sister; Breast cancer in her niece; Colon cancer (age of onset: 51) in her brother; Colon polyps in her brother, brother, sister, and sister; Depression in her brother and sister; Drug abuse in her son; Esophageal cancer in her sister; Heart attack in her brother, father, and mother; Heart disease in her mother.She reports that she has been smoking cigarettes. She started smoking about 54 years ago. She has a 54.3 pack-year smoking history. She has never used smokeless tobacco. She reports that she does not drink alcohol and does not use drugs.    ROS Review of Systems  Constitutional: Negative.   HENT: Negative.    Eyes:  Negative for visual disturbance.  Respiratory:  Negative for shortness of breath.   Cardiovascular:  Negative for chest pain.  Gastrointestinal:  Negative for abdominal pain.   Genitourinary:  Positive for enuresis.  Musculoskeletal:  Negative for arthralgias.    Objective:  BP 113/70   Pulse 84   Temp (!) 97.2 F (36.2 C)   Ht 5\' 5"  (1.651 m)   Wt 138 lb (62.6 kg)   SpO2 94%   BMI 22.96 kg/m   BP Readings from Last 3 Encounters:  08/05/23 113/70  07/24/23 122/70  06/23/23 128/80    Wt Readings from Last 3 Encounters:  08/05/23 138 lb (62.6 kg)  07/24/23 140 lb 12.8 oz (63.9 kg)  06/23/23 139 lb (63 kg)     Physical Exam Constitutional:      General: She is not in acute distress.    Appearance: She is well-developed. She is ill-appearing.  Cardiovascular:     Rate and Rhythm: Normal rate and regular rhythm.  Pulmonary:     Breath sounds: Normal breath sounds.  Abdominal:     General: There is no distension.     Palpations: Abdomen is soft.     Tenderness: There is no abdominal tenderness.  Musculoskeletal:        General: Normal range of motion.  Skin:    General: Skin is warm and dry.  Neurological:     Mental Status: She is alert and oriented to person, place, and time.      Assessment & Plan:  Urinary incontinence, unspecified type  Fecal smearing  Chronic obstructive pulmonary disease, unspecified COPD type (HCC)  Essential hypertension  Mixed hyperlipidemia -     Atorvastatin  Calcium ; Take 1 tablet (80 mg total) by mouth daily.  Dispense: 90 tablet; Refill: 3 -     Ezetimibe ; Take 1 tablet (10 mg total) by mouth daily.  Dispense: 90 tablet; Refill: 3   Orders completed for incontinence supplies.  She is at baseline for her COPD. Continue current tx.   Follow-up: Return in about 6 months (around 02/04/2024).  Roise Cleaver, M.D.

## 2023-08-05 NOTE — Addendum Note (Signed)
 Addended by: Veryl Gottron on: 08/05/2023 04:49 PM   Modules accepted: Orders

## 2023-08-05 NOTE — Telephone Encounter (Signed)
 Call report received:  IMPRESSION: Lung-RADS 4A, suspicious. Continued progression of mixed density right upper lobe pulmonary nodule. This showed no substantial hypermetabolism on previous PET-CT of 04/03/2022 but is mainly a ground-glass lesion with a new 3.9 mm confluent nodular component. Follow up low-dose chest CT without contrast in 3 months (please use the following order, "CT CHEST LCS NODULE FOLLOW-UP W/O CM") is recommended. Alternatively, PET may be considered when there is a solid component 8mm or larger.   Emphysema (ICD10-J43.9) and Aortic Atherosclerosis (ICD10-170.0)   These results will be called to the ordering clinician or representative by the Radiologist Assistant, and communication documented in the PACS or [employer].     Electronically Signed   By: Donnal Fusi M.D.   On: 08/05/2023 08:27

## 2023-08-06 ENCOUNTER — Telehealth: Payer: Self-pay | Admitting: Family Medicine

## 2023-08-06 NOTE — Telephone Encounter (Signed)
 Paperwork received and on PCPs desk

## 2023-08-06 NOTE — Telephone Encounter (Signed)
 Copied from CRM [id number]. Topic: General - Other >> Aug 06, 2023 12:49 PM DeAngela L wrote: Reason for CRM: [facility] calling they sent over a fax for medical supplies on 08/01/23 and would like to ask if the fax was received and status of the fax and the fax response return  Phone number [phone] return fax number [phone]

## 2023-08-07 DIAGNOSIS — D485 Neoplasm of uncertain behavior of skin: Secondary | ICD-10-CM | POA: Diagnosis not present

## 2023-08-07 DIAGNOSIS — R208 Other disturbances of skin sensation: Secondary | ICD-10-CM | POA: Diagnosis not present

## 2023-08-07 DIAGNOSIS — Z789 Other specified health status: Secondary | ICD-10-CM | POA: Diagnosis not present

## 2023-08-07 DIAGNOSIS — D0472 Carcinoma in situ of skin of left lower limb, including hip: Secondary | ICD-10-CM | POA: Diagnosis not present

## 2023-08-07 DIAGNOSIS — L538 Other specified erythematous conditions: Secondary | ICD-10-CM | POA: Diagnosis not present

## 2023-08-07 DIAGNOSIS — R58 Hemorrhage, not elsewhere classified: Secondary | ICD-10-CM | POA: Diagnosis not present

## 2023-08-07 DIAGNOSIS — L821 Other seborrheic keratosis: Secondary | ICD-10-CM | POA: Diagnosis not present

## 2023-08-10 ENCOUNTER — Telehealth: Payer: Self-pay

## 2023-08-10 NOTE — Telephone Encounter (Signed)
 Copied from CRM [id number]. Topic: Clinical - Medical Advice >> Aug 10, 2023  2:47 PM Shelby Dessert H wrote: Reason for CRM: Patient is calling because she is coughing really bad and its to the point she is coughing up stuff, patient is concerned because they found a spot of her lungs and she is wanting the provider to send her in a rx for congestion.  Patients callback number is [phone]

## 2023-08-11 ENCOUNTER — Other Ambulatory Visit: Payer: Self-pay | Admitting: Family Medicine

## 2023-08-11 MED ORDER — AZITHROMYCIN 250 MG PO TABS: ORAL_TABLET | ORAL | 0 refills | Status: DC

## 2023-08-11 MED ORDER — PROMETHAZINE-DM 6.25-15 MG/5ML PO SYRP: 5.0000 mL | ORAL_SOLUTION | Freq: Four times a day (QID) | ORAL | 0 refills | Status: DC | PRN

## 2023-08-11 NOTE — Telephone Encounter (Signed)
 Please let the patient know that I sent their prescription to their pharmacy. Thanks, WS

## 2023-08-12 NOTE — Telephone Encounter (Signed)
 Copied from CRM [id number]. Topic: General - Other >> Aug 12, 2023 12:56 PM Phil Braun wrote: Reason for CRM:   Supply company for patients incontinence supplies is calling asking about the form that they faxed over to send patient her order for them. Please advise.   Caller hung up before I could get information for call back information. I did speak to Mitzi at the office if you would like to get with her.

## 2023-08-12 NOTE — Telephone Encounter (Signed)
 Its on my desk to sign this afternoon.

## 2023-08-12 NOTE — Telephone Encounter (Signed)
Pt notified.    LS

## 2023-08-13 NOTE — Telephone Encounter (Signed)
 Paper work completed and faxed back.

## 2023-08-14 ENCOUNTER — Telehealth: Payer: Self-pay

## 2023-08-14 NOTE — Telephone Encounter (Signed)
 Copied from CRM [id number]. Topic: Clinical - Prescription Issue >> Aug 14, 2023  1:59 PM Zipporah Him wrote: Reason for CRM: [facility] needing a fax returned to them in regard to the patient needing incontinence supplies, along with a physicians order. It was faxed over on 6/5. Please advise if there are any issues. [phone]

## 2023-08-14 NOTE — Telephone Encounter (Signed)
 Orders have been faxed, letter of medical necessity is on PCP's desk for signature

## 2023-08-17 ENCOUNTER — Other Ambulatory Visit (HOSPITAL_COMMUNITY): Payer: Self-pay | Admitting: Psychiatry

## 2023-08-17 DIAGNOSIS — F411 Generalized anxiety disorder: Secondary | ICD-10-CM

## 2023-08-18 NOTE — Telephone Encounter (Signed)
 LMN faxed to [facility] at [phone]

## 2023-08-20 ENCOUNTER — Telehealth: Payer: Self-pay

## 2023-08-20 NOTE — Telephone Encounter (Signed)
 Copied from CRM [id number]. Topic: General - Other >> Aug 20, 2023 11:48 AM Carlatta H wrote: Reason for CRM: Can you please refax home delivery paperwork fax to [phone]

## 2023-08-24 ENCOUNTER — Ambulatory Visit (INDEPENDENT_AMBULATORY_CARE_PROVIDER_SITE_OTHER)

## 2023-08-24 VITALS — BP 113/70 | HR 84 | Ht 65.0 in | Wt 138.0 lb

## 2023-08-24 DIAGNOSIS — Z13820 Encounter for screening for osteoporosis: Secondary | ICD-10-CM

## 2023-08-24 DIAGNOSIS — Z0000 Encounter for general adult medical examination without abnormal findings: Secondary | ICD-10-CM | POA: Diagnosis not present

## 2023-08-24 NOTE — Progress Notes (Signed)
 Subjective:   [name] is a 68 y.o. who presents for a Medicare Wellness preventive visit.  As a reminder, Annual Wellness Visits don't include a physical exam, and some assessments may be limited, especially if this visit is performed virtually. We may recommend an in-person follow-up visit with your provider if needed.  Visit Complete: Virtual I connected with  [name] on 08/24/23 by a audio (per pt request) enabled telemedicine application and verified that I am speaking with the correct person using two identifiers.  Patient Location: Home  Provider Location: Home Office  I discussed the limitations of evaluation and management by telemedicine. The patient expressed understanding and agreed to proceed.  Vital Signs: Because this visit was a virtual/telehealth visit, some criteria may be missing or patient reported. Any vitals not documented were not able to be obtained and vitals that have been documented are patient reported.  VideoDeclined- This patient declined Librarian, academic. Therefore the visit was completed with audio only.  Persons Participating in Visit: Patient.  AWV Questionnaire: No: Patient Medicare AWV questionnaire was not completed prior to this visit.  Cardiac Risk Factors include: advanced age (>45men, >62 women);dyslipidemia;hypertension     Objective:    Today's Vitals   08/24/23 1417 08/24/23 1421  BP: 113/70   Pulse: 84   Weight: 138 lb (62.6 kg)   Height: 5' 5 (1.651 m)   PainSc:  7    Body mass index is 22.96 kg/m.     08/24/2023    2:27 PM 04/16/2022    8:08 PM 04/16/2021   10:37 AM 04/08/2021    1:27 PM 03/29/2021   12:38 AM 03/28/2021   12:34 PM 03/15/2021   11:12 AM  Advanced Directives  Does Patient Have a Medical Advance Directive? No No No No  No No  Would patient like information on creating a medical advance directive?  Yes (MAU/Ambulatory/Procedural Areas - Information given) No - Patient  declined No - Patient declined No - Patient declined  No - Patient declined    Current Medications (verified) Outpatient Encounter Medications as of 08/24/2023  Medication Sig   albuterol  (VENTOLIN  HFA) 108 (90 Base) MCG/ACT inhaler Inhale 2 puffs into the lungs every 6 (six) hours as needed for wheezing or shortness of breath.   alendronate  (FOSAMAX ) 70 MG tablet Take 1 tablet (70 mg total) by mouth every 7 (seven) days. Take with a full glass of water  on an empty stomach. Do not lay down for at least 2 hours   amLODipine  (NORVASC ) 10 MG tablet Take 1 tablet (10 mg total) by mouth daily.   atorvastatin  (LIPITOR) 80 MG tablet Take 1 tablet (80 mg total) by mouth daily.   carbidopa -levodopa  (SINEMET ) 10-100 MG tablet Take 1 tablet by mouth 3 (three) times daily. For parkinsonism   clonazePAM  (KLONOPIN ) 1 MG tablet Take 1 tablet (1 mg total) by mouth 3 (three) times daily as needed. for anxiety   dexlansoprazole  (DEXILANT ) 60 MG capsule TAKE ONE CAPSULE BY MOUTH DAILY ON AN EMPTY STOMACH   DULoxetine  (CYMBALTA ) 60 MG capsule Take 1 capsule (60 mg total) by mouth 2 (two) times daily.   ezetimibe  (ZETIA ) 10 MG tablet Take 1 tablet (10 mg total) by mouth daily.   gabapentin (NEURONTIN) 600 MG tablet Take 600 mg by mouth 3 (three) times daily.   ipratropium-albuterol  (DUONEB) 0.5-2.5 (3) MG/3ML SOLN Take 3 mLs by nebulization every 6 (six) hours as needed.   levocetirizine (XYZAL ) 5 MG  tablet Take 1 tablet (5 mg total) by mouth every evening.   LINZESS  145 MCG CAPS capsule TAKE ONE CAPSULE BY MOUTH DAILY TO regulate bowel movement   lisinopril  (ZESTRIL ) 40 MG tablet Take 1 tablet (40 mg total) by mouth daily.   meclizine  (ANTIVERT ) 25 MG tablet Take 1 tablet (25 mg total) by mouth 3 (three) times daily as needed for dizziness.   methocarbamol  (ROBAXIN ) 750 MG tablet Take 750 mg by mouth every 6 (six) hours as needed.   naproxen sodium (ALEVE) 220 MG tablet Take 220 mg by mouth 2 (two) times daily as  needed (pain).   promethazine -dextromethorphan  (PROMETHAZINE -DM) 6.25-15 MG/5ML syrup Take 5 mLs by mouth 4 (four) times daily as needed for cough.   rOPINIRole  (REQUIP ) 3 MG tablet TAKE 1 TABLET BY MOUTH AT BEDTIME (needs TO be seen BEFORE NEXT refill)   SYMBICORT  160-4.5 MCG/ACT inhaler INHALE 2 puffs into THE lungs TWICE DAILY   Tiotropium Bromide  Monohydrate (SPIRIVA  RESPIMAT) 2.5 MCG/ACT AERS INHALE 2 puffs BY MOUTH DAILY   Vitamin D , Ergocalciferol , (DRISDOL ) 1.25 MG (50000 UNIT) CAPS capsule Take 1 capsule (50,000 Units total) by mouth every 7 (seven) days.   acetaminophen  (TYLENOL ) 500 MG tablet Take 1,000 mg by mouth every 6 (six) hours as needed for moderate pain. (Patient not taking: Reported on 08/24/2023)   azithromycin  (ZITHROMAX  Z-PAK) 250 MG tablet Take two right away Then one a day for the next 4 days. (Patient not taking: Reported on 08/24/2023)   No facility-administered encounter medications on file as of 08/24/2023.    Allergies (verified) Fentanyl , Lorcet 10-650 [hydrocodone -acetaminophen ], Sulfa  antibiotics, Tape, Tylenol  [acetaminophen ], and Wellbutrin  xl [bupropion  hcl er (xl)]   History: Past Medical History:  Diagnosis Date   Allergy    Arthritis    Atypical mole 06/05/2003   slight-mod-Left scapula (WS)   Atypical nevus 07/26/2003   persist. dysp- Left scapula (WS)   Basal cell carcinoma 06/05/2003   RIght chest (CX35FU)   Basal cell carcinoma 05/22/2004   superificial- RIght upper back- (CX35FU)   Basal cell carcinoma 11/27/2004   beside R nostril-(MOHS), below right outer nose (MOHS)   Basal cell carcinoma 11/08/2009   left post shoulder -(txpbx)   Basal cell carcinoma 05/24/2012   ulcerated- Left post shoulder- (CX35FU), ulcerated-mid forehead (EXC )   Basal cell carcinoma 06/24/2012   superificial- Right shoulder - (txpbx)    Basal cell carcinoma 07/29/2012    sclerosis- mid forehead (MOHS)   Basal cell carcinoma 06/06/2019   nod-right anterior neck  (CX35FU)   Cataract    Closed compression fracture of L5 vertebra (HCC)    Constipation    uses OTC meds with help   COPD (chronic obstructive pulmonary disease) (HCC)    Coronary artery disease    minimal nonobstructive    COVID    Depression    GERD (gastroesophageal reflux disease)    Heart murmur    Hx of adenomatous polyp of colon 10/03/2014   Hypercholesteremia    Hypertension    Panic attacks    Perimenopausal    Skin cancer    Squamous cell carcinoma of skin 06/05/2003   Right Temple(CX35FU)   Squamous cell carcinoma of skin 05/22/2004   in situ- Left upperarm (CX35FU)   Squamous cell carcinoma of skin 11/27/2004   in situ- above Left elbow (CX35FU)   Squamous cell carcinoma of skin 03/04/2006   in situ- Left sideburn (CX35FU)   Squamous cell carcinoma of skin 11/26/2006   in  situ- mid brow (Cx35FU)   Squamous cell carcinoma of skin 05/24/2012   in situ- Left chest- (CX35FU)   Squamous cell carcinoma of skin 04/05/2013   in situ- Left nose (Cx35FU)   Squamous cell carcinoma of skin 08/17/2013   well diff-above Left eye (txpbx)   Squamous cell carcinoma of skin 07/30/2016   in situ- right chest sup (Cx35FU)   Squamous cell carcinoma of skin 05/12/2017   in situ- RIght shoulder (CX35FU), in situ- Left forehead (CX35FU)   Squamous cell carcinoma of skin 11/23/2018   in situ- Right mid shin, inner- (MOHS)   Tobacco user    Past Surgical History:  Procedure Laterality Date   ABDOMINAL HYSTERECTOMY     CATARACT EXTRACTION W/PHACO Right 03/01/2021   Procedure: CATARACT EXTRACTION PHACO AND INTRAOCULAR LENS PLACEMENT (IOC);  Surgeon: Harrie Agent, MD;  Location: [facility];  Service: Ophthalmology;  Laterality: Right;  CDE: 7.31   CATARACT EXTRACTION W/PHACO Left 05/16/2021   Procedure: CATARACT EXTRACTION PHACO AND INTRAOCULAR LENS PLACEMENT (IOC);  Surgeon: Harrie Agent, MD;  Location: [facility];  Service: Ophthalmology;  Laterality: Left;  CDE 9.41    COLONOSCOPY      IR VERTEBROPLASTY CERV/THOR BX INC UNI/BIL INC/INJECT/IMAGING  04/16/2021   Right foot toe surgery     UPPER GASTROINTESTINAL ENDOSCOPY     vocal cord     polyp removal   Family History  Problem Relation Age of Onset   Heart disease Mother    Heart attack Mother    Heart attack Father    Colon polyps Sister    Esophageal cancer Sister    Anxiety disorder Sister    Depression Sister    Colon polyps Sister    Heart attack Brother    Colon cancer Brother 46       died at age 33   Anxiety disorder Brother    Depression Brother    Alcohol abuse Brother    Colon polyps Brother    Colon polyps Brother    Drug abuse Son    Breast cancer Niece    Rectal cancer Neg Hx    Stomach cancer Neg Hx    Social History   Socioeconomic History   Marital status: Single    Spouse name: Not on file   Number of children: 2   Years of education: Not on file   Highest education level: 10th grade  Occupational History   Occupation: Conservation officer, 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   Sexual activity: Not on file  Other Topics Concern   Not on file  Social History Narrative   Retired/ lives alone in one level home   Son and daughter in law that live next door and help her out a lot         Social Drivers of Health   Financial Resource Strain: High Risk (08/24/2023)   Overall Physicist, medical  Strain (CARDIA)    Difficulty of Paying Living Expenses: Hard  Food Insecurity: Food Insecurity Present (08/24/2023)   Hunger  Vital Sign    Worried About Running Out of Food in the Last Year: Sometimes true    Ran Out of Food in the Last Year: Sometimes true  Transportation Needs: No Transportation Needs (08/24/2023)   PRAPARE - Administrator, Civil Service (Medical): No    Lack of Transportation (Non-Medical): No  Recent Concern: Transportation Needs - Unmet Transportation Needs (06/22/2023)   PRAPARE - Transportation    Lack of Transportation (Medical): No    Lack of Transportation (Non-Medical): Yes  Physical Activity: Inactive (08/24/2023)   Exercise Vital Sign    Days of Exercise per Week: 0 days    Minutes of Exercise per Session: Not on file  Stress: Stress Concern Present (08/24/2023)   [employer] of Occupational Health - Occupational Stress Questionnaire    Feeling of Stress: Rather much  Social Connections: Socially Isolated (08/24/2023)   Social Connection and Isolation Panel    Frequency of Communication with Friends and Family: Twice a week    Frequency of Social Gatherings with Friends and Family: Twice a week    Attends Religious Services: Never    Database administrator or Organizations: No    Attends Banker Meetings: Never    Marital Status: Widowed    Tobacco Counseling Ready to quit: No Counseling given: Yes    Clinical Intake:  Pre-visit preparation completed: Yes  Pain : 0-10 (r-hip down to leg) Pain Score: 7  Pain Type: Chronic pain Pain Location: Hip Pain Orientation: Right Pain Radiating Towards: leg Pain Descriptors / Indicators: Aching, Constant Pain Onset: Other (comment) Pain Frequency: Constant Pain Relieving Factors: pain meds  Pain Relieving Factors: pain meds  Nutritional Status: BMI of 19-24  Normal Nutritional Risks: None Diabetes: No  Lab Results  Component Value Date   HGBA1C 5.9 (H) 07/12/2013     How often do you need to have someone help you when you read instructions, pamphlets, or other written materials from  your doctor or pharmacy?: 1 - Never  Interpreter Needed?: No  Information entered by :: Alia t/cma   Activities of Daily Living     08/24/2023    2:25 PM  In your present state of health, do you have any difficulty performing the following activities:  Hearing? 0  Vision? 0  Difficulty concentrating or making decisions? 1  Walking or climbing stairs? 1  Dressing or bathing? 0  Doing errands, shopping? 1  Comment pt use [facility] Wellsite geologist and eating ? Y  Comment eats only frozen dinners/sandwhiches  Using the Toilet? N  In the past six months, have you accidently leaked urine? Y  Do you have problems with loss of bowel control? Y  Managing your Medications? N  Managing your Finances? N  Housekeeping or managing your Housekeeping? Y    Patient Care Team: Zollie Lowers, MD as PCP - General (Family Medicine) Alvan, Dorn FALCON, MD as PCP - Cardiology (Cardiology) Avram Lupita BRAVO, MD as Consulting Physician (Gastroenterology) Okey Barnie SAUNDERS, MD as Consulting Physician (Behavioral Health) Livingston Rigg, MD (Inactive) as Consulting Physician (Dermatology) Mannam, Praveen, MD as Consulting Physician (Pulmonary Disease) Onesimo Oneil LABOR, MD as Consulting Physician (Orthopedic Surgery) Euna Read, MD as Referring Physician (Neurosurgery) Harrie Agent, MD as Consulting Physician (Ophthalmology) Dr [facility], OD (Optometry)  I have updated your Care Teams any  recent Medical Services you may have received from other providers in the past year.     Assessment:   This is a routine wellness examination for [facility].  Hearing/Vision screen No results found.   Goals Addressed             This Visit's Progress    Patient Stated       Get moving back to her other trailer instead of the current one she is in       Depression Screen     08/24/2023    2:34 PM 08/05/2023   11:14 AM 02/12/2023   12:06 PM 01/15/2023    1:25 PM  12/17/2022    2:03 PM 12/17/2022    1:50 PM 08/20/2022   11:29 AM  PHQ 2/9 Scores  PHQ - 2 Score 4 4 0 4 3 0 4  PHQ- 9 Score 14 12  12 16  14     Fall Risk     08/24/2023    2:21 PM 02/12/2023   12:06 PM 12/17/2022    2:02 PM 12/17/2022    1:50 PM 08/20/2022   11:29 AM  Fall Risk   Falls in the past year? 1 0 1 0 1  Number falls in past yr: 1 0 1  1  Injury with Fall? 0 0 1  1  Risk for fall due to : Impaired balance/gait;Impaired mobility  History of fall(s)  History of fall(s)  Follow up Falls evaluation completed;Education provided;Falls prevention discussed  Falls evaluation completed  Falls evaluation completed    MEDICARE RISK AT HOME:  Medicare Risk at Home Any stairs in or around the home?: Yes If so, are there any without handrails?: Yes Home free of loose throw rugs in walkways, pet beds, electrical cords, etc?: Yes Adequate lighting in your home to reduce risk of falls?: Yes Life alert?: No Use of a cane, walker or w/c?: Yes (uses a cane) Grab bars in the bathroom?: No Shower chair or bench in shower?: No Elevated toilet seat or a handicapped toilet?: No  TIMED UP AND GO:  Was the test performed?  no  Cognitive Function: 6CIT completed        08/24/2023    2:37 PM 04/16/2022    8:09 PM 04/02/2020    1:34 PM  6CIT Screen  What Year? 0 points 0 points 0 points  What month? 0 points 0 points 0 points  What time? 0 points 0 points 0 points  Count back from 20 0 points 0 points 0 points  Months in reverse 0 points 0 points 0 points  Repeat phrase 0 points 0 points 0 points  Total Score 0 points 0 points 0 points    Immunizations Immunization History  Administered Date(s) Administered   Fluad Quad(high Dose 65+) 12/10/2018, 01/30/2021, 12/16/2021   Fluad Trivalent(High Dose 65+) 11/24/2022   Influenza, High Dose Seasonal PF 12/05/2019   Influenza,inj,Quad PF,6+ Mos 02/11/2013, 05/17/2015, 12/03/2015, 12/26/2016, 12/16/2017   Influenza-Unspecified  12/15/2018   Moderna Sars-Covid-2 Vaccination 05/05/2019, 06/17/2019   Pneumococcal Conjugate-13 08/22/2019   Pneumococcal Polysaccharide-23 08/28/2020   Tdap 08/22/2019   Zoster Recombinant(Shingrix) 08/28/2020, 05/01/2021   Zoster, Live 06/02/2014    Screening Tests Health Maintenance  Topic Date Due   DEXA SCAN  05/04/2023   MAMMOGRAM  06/22/2024 (Originally 07/31/2022)   Colonoscopy  06/22/2024 (Originally 01/22/2023)   COVID-19 Vaccine (3 - Moderna risk series) 09/08/2024 (Originally 07/15/2019)   INFLUENZA VACCINE  10/02/2023   Lung Cancer Screening  07/12/2024   Medicare Annual Wellness (AWV)  08/23/2024   DTaP/Tdap/Td (2 - Td or Tdap) 08/21/2029   Pneumococcal Vaccine: 50+ Years  Completed   Hepatitis C Screening  Completed   Zoster Vaccines- Shingrix  Completed   HPV VACCINES  Aged Out   Meningococcal B Vaccine  Aged Out    Health Maintenance  Health Maintenance Due  Topic Date Due   DEXA SCAN  05/04/2023   Health Maintenance Items Addressed: DEXA ordered  Additional Screening:  Vision Screening: Recommended annual ophthalmology exams for early detection of glaucoma and other disorders of the eye. Would you like a referral to an eye doctor? No    Dental Screening: Recommended annual dental exams for proper oral hygiene  Community Resource Referral / Chronic Care Management: CRR required this visit?  No   CCM required this visit?  No   Plan:    I have personally reviewed and noted the following in the patient's chart:   Medical and social history Use of alcohol, tobacco or illicit drugs  Current medications and supplements including opioid prescriptions. Patient is not currently taking opioid prescriptions. Functional ability and status Nutritional status Physical activity Advanced directives List of other physicians Hospitalizations, surgeries, and ER visits in previous 12 months Vitals Screenings to include cognitive, depression, and  falls Referrals and appointments  In addition, I have reviewed and discussed with patient certain preventive protocols, quality metrics, and best practice recommendations. A written personalized care plan for preventive services as well as general preventive health recommendations were provided to patient.   Ozie Ned, CMA   08/24/2023   After Visit Summary: (MyChart) Due to this being a telephonic visit, the after visit summary with patients personalized plan was offered to patient via MyChart   Notes: Please refer to Routing Comments.

## 2023-08-24 NOTE — Patient Instructions (Signed)
 [name] , Thank you for taking time out of your busy schedule to complete your Annual Wellness Visit with me. I enjoyed our conversation and look forward to speaking with you again next year. I, as well as your care team,  appreciate your ongoing commitment to your health goals. Please review the following plan we discussed and let me know if I can assist you in the future. Your Game plan/ To Do List    Follow up Visits: Next Medicare AWV with our clinical staff: 08/24/24 at 1:10p.m.   Next Office Visit with your provider: 02/04/24 at 11:10a.m.  Clinician Recommendations:  Aim for 30 minutes of exercise or brisk walking, 6-8 glasses of water , and 5 servings of fruits and vegetables each day. N/a      This is a list of the screening recommended for you and due dates:  Health Maintenance  Topic Date Due   DEXA scan (bone density measurement)  05/04/2023   Mammogram  06/22/2024*   Colon Cancer Screening  06/22/2024*   COVID-19 Vaccine (3 - Moderna risk series) 09/08/2024*   Flu Shot  10/02/2023   Screening for Lung Cancer  07/12/2024   Medicare Annual Wellness Visit  08/23/2024   DTaP/Tdap/Td vaccine (2 - Td or Tdap) 08/21/2029   Pneumococcal Vaccine for age over 84  Completed   Hepatitis C Screening  Completed   Zoster (Shingles) Vaccine  Completed   HPV Vaccine  Aged Out   Meningitis B Vaccine  Aged Out  *Topic was postponed. The date shown is not the original due date.    Advanced directives: (Declined) Advance directive discussed with you today. Even though you declined this today, please call our office should you change your mind, and we can give you the proper paperwork for you to fill out. Advance Care Planning is important because it:  [x]  Makes sure you receive the medical care that is consistent with your values, goals, and preferences  [x]  It provides guidance to your family and loved ones and reduces their decisional burden about whether or not they are making the right  decisions based on your wishes.  Follow the link provided in your after visit summary or read over the paperwork we have mailed to you to help you started getting your Advance Directives in place. If you need assistance in completing these, please reach out to us  so that we can help you!  See attachments for Preventive Care and Fall Prevention Tips.

## 2023-08-25 NOTE — Telephone Encounter (Signed)
 LOMN, Physician's order and CMN all re-faxed to [phone]

## 2023-08-26 ENCOUNTER — Telehealth: Payer: Self-pay | Admitting: Family Medicine

## 2023-08-26 DIAGNOSIS — M6281 Muscle weakness (generalized): Secondary | ICD-10-CM | POA: Diagnosis not present

## 2023-08-26 NOTE — Telephone Encounter (Signed)
 Copied from CRM [id number]. Topic: General - Other >> Aug 26, 2023 12:41 PM Kevelyn M wrote: Reason for CRM: Tiara with [facility] calling. Form 122 is what we are faxing back but we need fax back Form 121. Wyn is going refax it today. It will have the start date 09/15/2023. Any clarifying questions please call # [phone].

## 2023-08-27 ENCOUNTER — Other Ambulatory Visit: Payer: Self-pay | Admitting: Family Medicine

## 2023-08-27 NOTE — Telephone Encounter (Signed)
 Form received

## 2023-08-29 ENCOUNTER — Encounter (HOSPITAL_COMMUNITY): Payer: Self-pay | Admitting: Interventional Radiology

## 2023-09-02 ENCOUNTER — Telehealth: Payer: Self-pay

## 2023-09-02 NOTE — Telephone Encounter (Signed)
 Copied from CRM [id number]. Topic: Medical Record Request - Provider/Facility Request >> Sep 02, 2023 10:25 AM Wyona SQUIBB wrote: Reason for CRM:  Tor[facility]io with [facility] calling in because Form 122 was refaxed on 08/26/2023 because the nformation was missing form a couple of questions. if can be filled out and returned. Pt has start date of 09/15/2023, and would like the information filled out before then.   Fax number [phone] Callback [phone]- ask for daniel or anyone avilable. - secured line.

## 2023-09-03 NOTE — Telephone Encounter (Signed)
 Form and update LMN faxed to [facility]

## 2023-09-04 ENCOUNTER — Other Ambulatory Visit: Payer: Self-pay | Admitting: Family Medicine

## 2023-09-09 ENCOUNTER — Telehealth (HOSPITAL_COMMUNITY): Admitting: Psychiatry

## 2023-09-09 ENCOUNTER — Telehealth: Payer: Self-pay

## 2023-09-09 ENCOUNTER — Encounter (HOSPITAL_COMMUNITY): Payer: Self-pay | Admitting: Psychiatry

## 2023-09-09 DIAGNOSIS — F411 Generalized anxiety disorder: Secondary | ICD-10-CM

## 2023-09-09 DIAGNOSIS — F332 Major depressive disorder, recurrent severe without psychotic features: Secondary | ICD-10-CM

## 2023-09-09 MED ORDER — CLONAZEPAM 1 MG PO TABS: 1.0000 mg | ORAL_TABLET | Freq: Three times a day (TID) | ORAL | 2 refills | Status: DC | PRN

## 2023-09-09 MED ORDER — DULOXETINE HCL 60 MG PO CPEP: 60.0000 mg | ORAL_CAPSULE | Freq: Two times a day (BID) | ORAL | 3 refills | Status: DC

## 2023-09-09 NOTE — Telephone Encounter (Signed)
 Copied from CRM [id number]. Topic: General - Other >> Sep 09, 2023 11:20 AM Sophia H wrote: Reason for CRM: Spoke with [facility] who is calling in because question # 30 was missing on form 122 and is needing it to be filled out and faxed back over.   Fax # [phone]

## 2023-09-09 NOTE — Progress Notes (Signed)
 Virtual Visit via Video Note  I connected with [name] on 09/09/23 at  3:00 PM EDT by a video enabled telemedicine application and verified that I am speaking with the correct person using two identifiers.  Location: Patient: home Provider: office   I discussed the limitations of evaluation and management by telemedicine and the availability of in person appointments. The patient expressed understanding and agreed to proceed.     I discussed the assessment and treatment plan with the patient. The patient was provided an opportunity to ask questions and all were answered. The patient agreed with the plan and demonstrated an understanding of the instructions.   The patient was advised to call back or seek an in-person evaluation if the symptoms worsen or if the condition fails to improve as anticipated.  I provided 20 minutes of non-face-to-face time during this encounter.   Barnie Gull, MD  [facility] MD/PA/NP OP Progress Note  09/09/2023 3:20 PM [name]  MRN:  [id number]  Chief Complaint:  Chief Complaint  Patient presents with   Anxiety   Depression   Follow-up   HPI: This patient is a 68 year old female who is widowed and lives alone in [place].  She is on disability.  The patient returns for follow-up after 3 months regarding her depression and anxiety.  She states that she is still stressed.  Her car is no longer working and she has to rely on neighbors for rides.  One of her sons is still incarcerated and the other has significant medical problems.  She herself has an enlarging lung nodule on CT scan and she has to have a repeat scan next month.  She is worried she could have lung cancer.  Nevertheless she feels that her anxiety and depression are under fairly good control with the medications.  She denies any thoughts of self-harm or suicide. Visit Diagnosis:    ICD-10-CM   1. GAD (generalized anxiety disorder)  F41.1 clonazePAM  (KLONOPIN ) 1 MG tablet    2. Severe  episode of recurrent major depressive disorder, without psychotic features (HCC)  F33.2 DULoxetine  (CYMBALTA ) 60 MG capsule      Past Psychiatric History: none  Past Medical History:  Past Medical History:  Diagnosis Date   Allergy    Arthritis    Atypical mole 06/05/2003   slight-mod-Left scapula (WS)   Atypical nevus 07/26/2003   persist. dysp- Left scapula (WS)   Basal cell carcinoma 06/05/2003   RIght chest (CX35FU)   Basal cell carcinoma 05/22/2004   superificial- RIght upper back- (CX35FU)   Basal cell carcinoma 11/27/2004   beside R nostril-(MOHS), below right outer nose (MOHS)   Basal cell carcinoma 11/08/2009   left post shoulder -(txpbx)   Basal cell carcinoma 05/24/2012   ulcerated- Left post shoulder- (CX35FU), ulcerated-mid forehead (EXC )   Basal cell carcinoma 06/24/2012   superificial- Right shoulder - (txpbx)    Basal cell carcinoma 07/29/2012    sclerosis- mid forehead (MOHS)   Basal cell carcinoma 06/06/2019   nod-right anterior neck (CX35FU)   Cataract    Closed compression fracture of L5 vertebra (HCC)    Constipation    uses OTC meds with help   COPD (chronic obstructive pulmonary disease) (HCC)    Coronary artery disease    minimal nonobstructive    COVID    Depression    GERD (gastroesophageal reflux disease)    Heart murmur    Hx of adenomatous polyp of colon 10/03/2014   Hypercholesteremia  Hypertension    Panic attacks    Perimenopausal    Skin cancer    Squamous cell carcinoma of skin 06/05/2003   Right [place](CX35FU)   Squamous cell carcinoma of skin 05/22/2004   in situ- Left upperarm (CX35FU)   Squamous cell carcinoma of skin 11/27/2004   in situ- above Left elbow (CX35FU)   Squamous cell carcinoma of skin 03/04/2006   in situ- Left sideburn (CX35FU)   Squamous cell carcinoma of skin 11/26/2006   in situ- mid brow (Cx35FU)   Squamous cell carcinoma of skin 05/24/2012   in situ- Left chest- (CX35FU)   Squamous cell carcinoma  of skin 04/05/2013   in situ- Left nose (Cx35FU)   Squamous cell carcinoma of skin 08/17/2013   well diff-above Left eye (txpbx)   Squamous cell carcinoma of skin 07/30/2016   in situ- right chest sup (Cx35FU)   Squamous cell carcinoma of skin 05/12/2017   in situ- RIght shoulder (CX35FU), in situ- Left forehead (CX35FU)   Squamous cell carcinoma of skin 11/23/2018   in situ- Right mid shin, inner- (MOHS)   Tobacco user     Past Surgical History:  Procedure Laterality Date   ABDOMINAL HYSTERECTOMY     CATARACT EXTRACTION W/PHACO Right 03/01/2021   Procedure: CATARACT EXTRACTION PHACO AND INTRAOCULAR LENS PLACEMENT (IOC);  Surgeon: Harrie Agent, MD;  Location: [facility];  Service: Ophthalmology;  Laterality: Right;  CDE: 7.31   CATARACT EXTRACTION W/PHACO Left 05/16/2021   Procedure: CATARACT EXTRACTION PHACO AND INTRAOCULAR LENS PLACEMENT (IOC);  Surgeon: Harrie Agent, MD;  Location: [facility];  Service: Ophthalmology;  Laterality: Left;  CDE 9.41    COLONOSCOPY     IR VERTEBROPLASTY CERV/THOR BX INC UNI/BIL INC/INJECT/IMAGING  04/16/2021   Right foot toe surgery     UPPER GASTROINTESTINAL ENDOSCOPY     vocal cord     polyp removal    Family Psychiatric History: See below  Family History:  Family History  Problem Relation Age of Onset   Heart disease Mother    Heart attack Mother    Heart attack Father    Colon polyps Sister    Esophageal cancer Sister    Anxiety disorder Sister    Depression Sister    Colon polyps Sister    Heart attack Brother    Colon cancer Brother 15       died at age 38   Anxiety disorder Brother    Depression Brother    Alcohol abuse Brother    Colon polyps Brother    Colon polyps Brother    Drug abuse Son    Breast cancer Niece    Rectal cancer Neg Hx    Stomach cancer Neg Hx     Social History:  Social History   Socioeconomic History   Marital status: Single    Spouse name: Not on file   Number of children: 2   Years of education:  Not on file   Highest education level: 10th grade  Occupational History   Occupation: Conservation officer, 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   Sexual activity: Not on file  Other Topics Concern   Not on file  Social History Narrative   Retired/ lives alone in one level home   Son and daughter in law that live next door and help her out a lot         Social Drivers of Health   Financial Resource Strain: High Risk (08/24/2023)   Overall Financial Resource Strain (CARDIA)    Difficulty of Paying Living Expenses: Hard  Food Insecurity: Food Insecurity Present (08/24/2023)   Hunger Vital Sign    Worried About Running Out of Food in the Last Year: Sometimes true    Ran Out of Food in the Last Year: Sometimes true  Transportation Needs: No Transportation Needs (08/24/2023)   PRAPARE - Administrator, Civil Service (Medical): No    Lack of Transportation (Non-Medical): No  Recent Concern: Transportation Needs - Unmet Transportation Needs (06/22/2023)   PRAPARE - Transportation    Lack of Transportation (Medical): No    Lack of Transportation (Non-Medical): Yes  Physical Activity: Inactive (08/24/2023)   Exercise Vital Sign    Days of Exercise per Week: 0 days    Minutes of Exercise per Session: Not on file  Stress: Stress Concern Present (08/24/2023)   [employer] of Occupational Health - Occupational Stress Questionnaire    Feeling of Stress: Rather much  Social Connections: Socially Isolated (08/24/2023)    Social Connection and Isolation Panel    Frequency of Communication with Friends and Family: Twice a week    Frequency of Social Gatherings with Friends and Family: Twice a week    Attends Religious Services: Never    Database administrator or Organizations: No    Attends Banker Meetings: Never    Marital Status: Widowed    Allergies:  Allergies  Allergen Reactions   Fentanyl  Other (See Comments)   Lorcet 10-650 [Hydrocodone -Acetaminophen ] Itching   Sulfa  Antibiotics Other (See Comments)    aching   Tape Other (See Comments)    Tear skin. Please use paper tape   Tylenol  [Acetaminophen ] Itching   Wellbutrin  Xl [Bupropion  Hcl Er (Xl)]     Upset stomach /bad taste in mouth    Metabolic Disorder Labs: Lab Results  Component Value Date   HGBA1C 5.9 (H) 07/12/2013   MPG 123 (H) 07/12/2013   No results found for: PROLACTIN Lab Results  Component Value Date   CHOL 145 06/23/2023   TRIG 130 06/23/2023   HDL 43 06/23/2023   CHOLHDL 3.4 06/23/2023   VLDL 36 07/13/2013   LDLCALC 79 06/23/2023   LDLCALC 92 12/17/2022   Lab Results  Component Value Date   TSH 1.820 05/25/2017   TSH 1.080 05/17/2015    Therapeutic Level Labs: No results found for: LITHIUM No results found for: VALPROATE No results found for: CBMZ  Current Medications: Current Outpatient Medications  Medication Sig Dispense Refill   acetaminophen  (TYLENOL ) 500 MG tablet Take 1,000 mg by mouth every 6 (six) hours as needed for moderate pain. (Patient not taking: Reported on 08/24/2023)     albuterol  (VENTOLIN  HFA) 108 (90 Base) MCG/ACT inhaler Inhale 2 puffs into the lungs every 6 (six) hours as needed for wheezing or shortness of breath. 8 g 5   alendronate  (FOSAMAX ) 70 MG tablet Take  1 tablet (70 mg total) by mouth every 7 (seven) days. Take with a full glass of water  on an empty stomach. Do not lay down for at least 2 hours 13 tablet 3   amLODipine  (NORVASC ) 10 MG tablet Take 1  tablet (10 mg total) by mouth daily. 90 tablet 3   atorvastatin  (LIPITOR) 80 MG tablet Take 1 tablet (80 mg total) by mouth daily. 90 tablet 3   azithromycin  (ZITHROMAX  Z-PAK) 250 MG tablet Take two right away Then one a day for the next 4 days. (Patient not taking: Reported on 08/24/2023) 6 each 0   carbidopa -levodopa  (SINEMET ) 10-100 MG tablet Take 1 tablet by mouth 3 (three) times daily. For parkinsonism 90 tablet 5   clonazePAM  (KLONOPIN ) 1 MG tablet Take 1 tablet (1 mg total) by mouth 3 (three) times daily as needed. for anxiety 90 tablet 2   dexlansoprazole  (DEXILANT ) 60 MG capsule TAKE ONE CAPSULE BY MOUTH DAILY ON AN EMPTY STOMACH 30 capsule 0   DULoxetine  (CYMBALTA ) 60 MG capsule Take 1 capsule (60 mg total) by mouth 2 (two) times daily. 180 capsule 3   ezetimibe  (ZETIA ) 10 MG tablet Take 1 tablet (10 mg total) by mouth daily. 90 tablet 3   gabapentin (NEURONTIN) 600 MG tablet Take 600 mg by mouth 3 (three) times daily.     ipratropium-albuterol  (DUONEB) 0.5-2.5 (3) MG/3ML SOLN Take 3 mLs by nebulization every 6 (six) hours as needed. 360 mL 5   levocetirizine (XYZAL ) 5 MG tablet Take 1 tablet (5 mg total) by mouth every evening. 90 tablet 3   LINZESS  145 MCG CAPS capsule TAKE ONE CAPSULE BY MOUTH DAILY TO regulate bowel movement 30 capsule 0   lisinopril  (ZESTRIL ) 40 MG tablet Take 1 tablet (40 mg total) by mouth daily. 90 tablet 3   meclizine  (ANTIVERT ) 25 MG tablet TAKE 1 TABLET BY MOUTH THREE TIMES DAILY AS NEEDED FOR dizziness. 60 tablet 2   methocarbamol  (ROBAXIN ) 750 MG tablet Take 750 mg by mouth every 6 (six) hours as needed.     naproxen sodium (ALEVE) 220 MG tablet Take 220 mg by mouth 2 (two) times daily as needed (pain).     promethazine -dextromethorphan  (PROMETHAZINE -DM) 6.25-15 MG/5ML syrup Take 5 mLs by mouth 4 (four) times daily as needed for cough. 240 mL 0   rOPINIRole  (REQUIP ) 3 MG tablet TAKE 1 TABLET BY MOUTH AT BEDTIME (needs TO be seen BEFORE NEXT refill) 150 tablet 0    SYMBICORT  160-4.5 MCG/ACT inhaler INHALE 2 puffs into THE lungs TWICE DAILY 122.4 g 0   Tiotropium Bromide  Monohydrate (SPIRIVA  RESPIMAT) 2.5 MCG/ACT AERS INHALE 2 puffs BY MOUTH DAILY 24 g 2   Vitamin D , Ergocalciferol , (DRISDOL ) 1.25 MG (50000 UNIT) CAPS capsule TAKE 1 CAPSULE BY MOUTH WEEKLY 13 capsule 1   No current facility-administered medications for this visit.     Musculoskeletal: Strength & Muscle Tone: within normal limits Gait & Station: normal Patient leans: N/A  Psychiatric Specialty Exam: Review of Systems  Gastrointestinal:  Positive for diarrhea.  Neurological:  Positive for tremors.  All other systems reviewed and are negative.   There were no vitals taken for this visit.There is no height or weight on file to calculate BMI.  General Appearance: Casual and Fairly Groomed  Eye Contact:  Good  Speech:  Clear and Coherent  Volume:  Normal  Mood:  Anxious  Affect:  Flat  Thought Process:  Goal Directed  Orientation:  Full (Time, Place, and Person)  Thought  Content: Rumination   Suicidal Thoughts:  No  Homicidal Thoughts:  No  Memory:  Immediate;   Good Recent;   Good Remote;   NA  Judgement:  Good  Insight:  Fair  Psychomotor Activity:  Decreased  Concentration:  Concentration: Good and Attention Span: Good  Recall:  Good  Fund of Knowledge: Good  Language: Good  Akathisia:  No  Handed:  Right  AIMS (if indicated): not done  Assets:  Communication Skills Desire for Improvement Resilience  ADL's:  Intact  Cognition: WNL  Sleep:  Fair   Screenings: GAD-7    Flowsheet Row Office Visit from 08/05/2023 in [facility] Office Visit from 12/17/2022 in [facility] Office Visit from 08/20/2022 in [facility] Office Visit from 06/17/2022 in [facility] Office Visit from 12/16/2021 in [facility]  Total GAD-7 Score 8 13 14 9 16    PHQ2-9    Flowsheet Row Clinical Support from 08/24/2023 in [facility] Office Visit from 08/05/2023 in [facility] Office Visit from 02/12/2023 in [facility] Office Visit from 01/15/2023 in [facility] Office Visit from 12/17/2022 in [facility]  PHQ-2 Total Score 4 4 0 4 3  PHQ-9 Total Score 14 12 -- 12 16   Flowsheet Row Video Visit from 10/21/2021 in [facility] [place] Video Visit from 07/22/2021 in [facility] [place] Admission (Discharged) from 05/16/2021 in [facility]  C-SSRS RISK CATEGORY No Risk No Risk No Risk     Assessment and Plan: This patient is a 68 year old female with a history of depression and anxiety.  She does have numerous stressors in her life but she does find that the medications are helpful.  She will continue Cymbalta  60 mg twice daily for depression and clonazepam  1 mg 3 times daily for anxiety.  She will return to see me in 3 months  Collaboration of Care: Collaboration of Care: Primary Care Provider AEB notes that shared with PCP through the epic system  Patient/Guardian was advised Release of Information must be obtained prior to any record release in order to collaborate their care with an outside provider. Patient/Guardian was advised if they have not already done so to contact the registration department to sign all necessary forms in order for us  to release information regarding their care.   Consent: Patient/Guardian gives verbal consent for treatment and assignment of benefits for services provided during this visit. Patient/Guardian expressed understanding and agreed to proceed.    Barnie Gull, MD 09/09/2023, 3:20 PM

## 2023-09-10 ENCOUNTER — Ambulatory Visit: Admitting: Family Medicine

## 2023-09-10 ENCOUNTER — Ambulatory Visit (INDEPENDENT_AMBULATORY_CARE_PROVIDER_SITE_OTHER)

## 2023-09-10 ENCOUNTER — Encounter: Payer: Self-pay | Admitting: Family Medicine

## 2023-09-10 VITALS — BP 133/75 | HR 76 | Temp 97.8°F | Ht 65.0 in | Wt 139.0 lb

## 2023-09-10 DIAGNOSIS — M47816 Spondylosis without myelopathy or radiculopathy, lumbar region: Secondary | ICD-10-CM | POA: Diagnosis not present

## 2023-09-10 DIAGNOSIS — M5136 Other intervertebral disc degeneration, lumbar region with discogenic back pain only: Secondary | ICD-10-CM | POA: Diagnosis not present

## 2023-09-10 DIAGNOSIS — Z780 Asymptomatic menopausal state: Secondary | ICD-10-CM

## 2023-09-10 DIAGNOSIS — Z0000 Encounter for general adult medical examination without abnormal findings: Secondary | ICD-10-CM

## 2023-09-10 DIAGNOSIS — Z13820 Encounter for screening for osteoporosis: Secondary | ICD-10-CM

## 2023-09-10 DIAGNOSIS — M5441 Lumbago with sciatica, right side: Secondary | ICD-10-CM

## 2023-09-10 MED ORDER — METHYLPREDNISOLONE ACETATE 80 MG/ML IJ SUSP: 80.0000 mg | Freq: Once | INTRAMUSCULAR | Status: DC

## 2023-09-10 NOTE — Progress Notes (Signed)
 Acute Office Visit  Subjective:     Patient ID: [name], female    DOB: Jul 09, 1955, 68 y.o.   MRN: [id number]  Chief Complaint  Patient presents with   Hip Pain    Right. Radiates down right leg.    HPI The patient is present to the clinic with a chief complain of right hip pain. She reports that the pain started started a week ago and has been getting progressively worse ever since. She also reports that the pain radiates down her upper right leg. She does not recall getting any trauma to her lower back or bending it in an uncomfortable way, but she does recall having a minor fall a month ago where she caught herself in the chair. She mentions that the pain is always present and she describes the pain as a sharp stabbing sensation. She mention that the pain gets significantly aggravated with any type of movement such as standing up, walking or laying down. She has tried ibuprofen  but it did not provide much relief. She describes the severity as an 8 out of 10, but reports that it goes up to a 10 if she moves her hip in wrong way. She has experienced a history of degenerative arthritis and an inflamed sciatic nerve, however she mentions that this feels more severe then her previous episodes.   Review of Systems  Constitutional:  Negative for chills, fever and weight loss.  HENT:  Negative for congestion, hearing loss and sore throat.   Eyes:  Negative for blurred vision.  Respiratory:  Negative for cough and shortness of breath.   Cardiovascular:  Negative for chest pain and leg swelling.  Gastrointestinal:  Negative for abdominal pain, constipation, diarrhea and nausea.  Genitourinary:  Negative for dysuria.  Musculoskeletal:  Positive for back pain (Endorses right hip pain that radiates to upper right leg.). Negative for falls.  Skin:  Negative for itching and rash.  Neurological:  Negative for dizziness, tingling, sensory change, weakness and headaches.  Psychiatric/Behavioral:   Negative for depression.       Objective:    BP 133/75   Pulse 76   Temp 97.8 F (36.6 C)   Ht 5' 5 (1.651 m)   Wt 139 lb (63 kg)   SpO2 95%   BMI 23.13 kg/m    Physical Exam Constitutional:      General: She is in acute distress.     Appearance: Normal appearance.  Cardiovascular:     Rate and Rhythm: Normal rate and regular rhythm.     Pulses: Normal pulses.     Heart sounds: Normal heart sounds. No murmur heard. Pulmonary:     Effort: Pulmonary effort is normal.     Breath sounds: Normal breath sounds.  Musculoskeletal:     Lumbar back: Tenderness (Midline lower spine tenderness to palpation) present. No swelling, deformity or signs of trauma. Decreased range of motion. Negative right straight leg raise test.     Right hip: Tenderness present. No deformity or bony tenderness. Decreased range of motion (Reduced due to pain in the lateral aspect of hip.).     Left hip: Normal.     Right upper leg: No swelling, deformity or tenderness.     Left upper leg: Normal.  Neurological:     Mental Status: She is alert and oriented to person, place, and time.        Assessment & Plan:   Problem List Items Addressed This Visit  Other   Back pain - Primary   Relevant Medications   methylPREDNISolone  acetate (DEPO-MEDROL ) injection 80 mg   Other Relevant Orders   DG Lumbar Spine 2-3 Views (Completed)    (1) Right hip and upper right leg pain - Assessment: Xray shows signs of degenerative disc disease in her lower spine. Symptoms likely caused by sciatic nerve inflammation. Physical exam, history, and findings on Xray make it less likely to be a fracture. Muscular pain also less likely given that the pain radiates down her leg and Xray shows signs of DDD.  - Plan: Administer DepoMedrol 80mg  IM today in clinic. Advised to RTC if symptoms persist or worsen. Discussed sending a referral to a spine specialist for further work-up if the symptoms do not improve.   Dotty Blanch, Medical Student  [employer]  at [facility] 09/10/23 12:25 PM   I was personally present for all components of the history, physical exam and/or medical decision making.  I agree with the documentation performed by the student and agree with assessment and plan above.  Fonda Levins, MD [facility] 09/17/2023, 7:55 AM

## 2023-09-11 ENCOUNTER — Telehealth: Payer: Self-pay | Admitting: Family Medicine

## 2023-09-11 DIAGNOSIS — Z780 Asymptomatic menopausal state: Secondary | ICD-10-CM | POA: Diagnosis not present

## 2023-09-11 DIAGNOSIS — M810 Age-related osteoporosis without current pathological fracture: Secondary | ICD-10-CM | POA: Diagnosis not present

## 2023-09-11 NOTE — Telephone Encounter (Unsigned)
 Copied from CRM [id number]. Topic: Referral - Request for Referral >> Sep 11, 2023 10:21 AM Wess RAMAN wrote: Did the patient discuss referral with their provider in the last year? Yes (If No - schedule appointment) (If Yes - send message)  Appointment offered? No  Type of order/referral and detailed reason for visit: Orthopedics  Preference of office, provider, location:  [facility] [address] [place], [place], [place] [address] Phone: [phone] Fax: [phone]  If referral order, have you been seen by this specialty before? No (If Yes, this issue or another issue? When? Where?  Can we respond through MyChart? Yes

## 2023-09-11 NOTE — Telephone Encounter (Signed)
 Please Review

## 2023-09-12 ENCOUNTER — Ambulatory Visit: Payer: Self-pay | Admitting: Family Medicine

## 2023-09-12 ENCOUNTER — Other Ambulatory Visit: Payer: Self-pay | Admitting: Family Medicine

## 2023-09-14 ENCOUNTER — Ambulatory Visit: Payer: Self-pay | Admitting: Family Medicine

## 2023-09-15 ENCOUNTER — Ambulatory Visit: Admitting: Family Medicine

## 2023-09-15 ENCOUNTER — Other Ambulatory Visit: Payer: Self-pay | Admitting: Family Medicine

## 2023-09-15 DIAGNOSIS — K2100 Gastro-esophageal reflux disease with esophagitis, without bleeding: Secondary | ICD-10-CM

## 2023-09-15 DIAGNOSIS — K5903 Drug induced constipation: Secondary | ICD-10-CM

## 2023-09-15 DIAGNOSIS — M545 Low back pain, unspecified: Secondary | ICD-10-CM

## 2023-09-15 NOTE — Telephone Encounter (Signed)
 Referral written. Information obtained that was needed. Can cancel previous message

## 2023-09-15 NOTE — Telephone Encounter (Signed)
 I need to know what problem she wants to be seen for in order to write the referral.

## 2023-09-16 NOTE — Telephone Encounter (Signed)
Pt notified.    LS

## 2023-09-20 IMAGING — MG MM DIGITAL SCREENING BILAT W/ TOMO AND CAD
8 series · 8 of 24 positions shown · non-contrast
Comparison: Previous exam(s).

CLINICAL DATA: Screening.

EXAM:
DIGITAL SCREENING BILATERAL MAMMOGRAM WITH TOMOSYNTHESIS AND CAD
TECHNIQUE: Bilateral screening digital craniocaudal and mediolateral oblique
mammograms were obtained. Bilateral screening digital breast
tomosynthesis was performed. The images were evaluated with
computer-aided detection.

[R CC synth-2D]
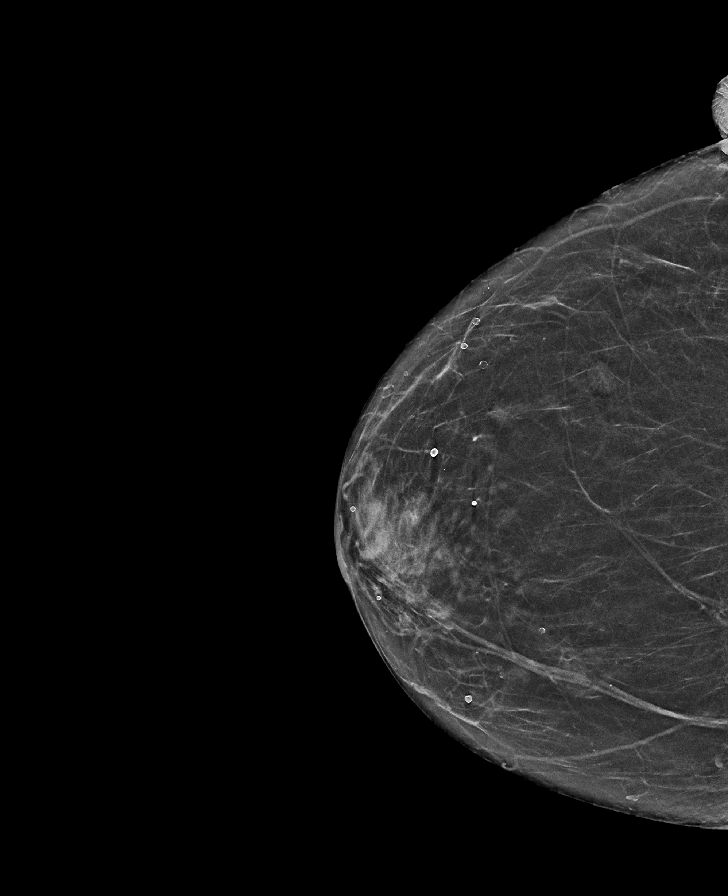

[L MLO synth-2D]
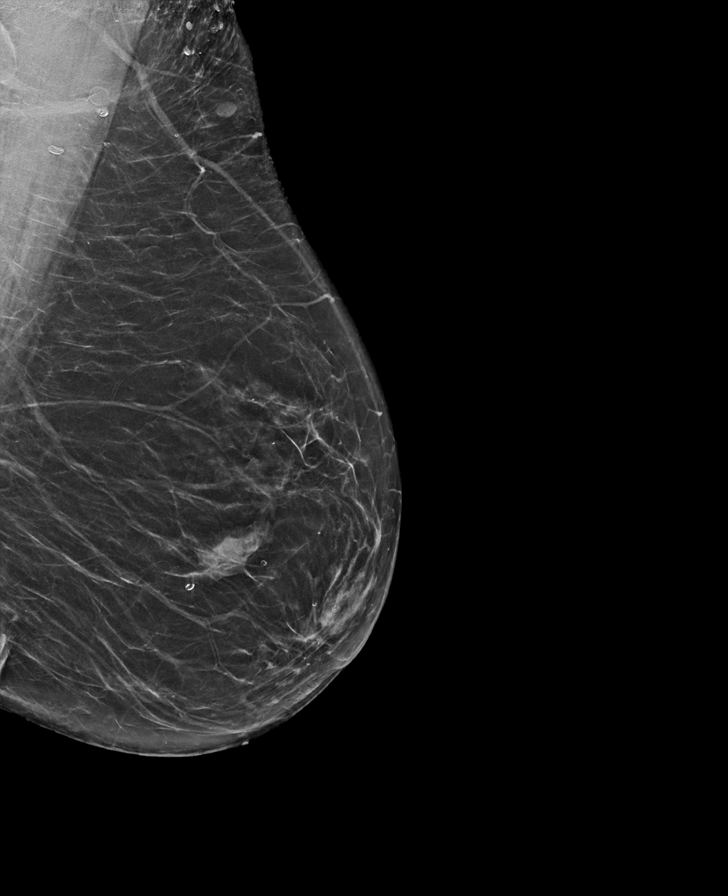

[R MLO synth-2D]
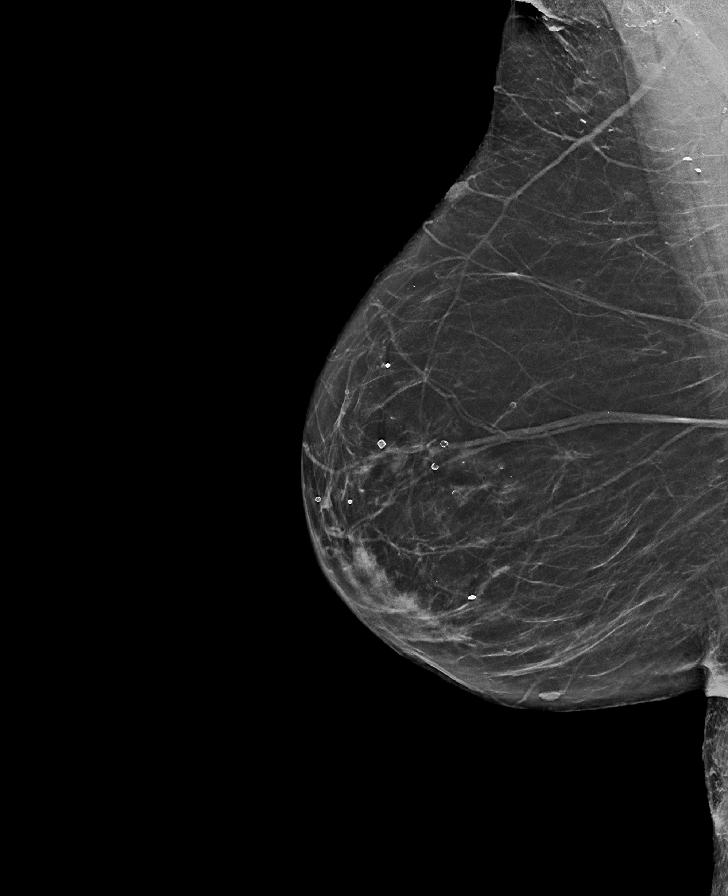

[L CC synth-2D]
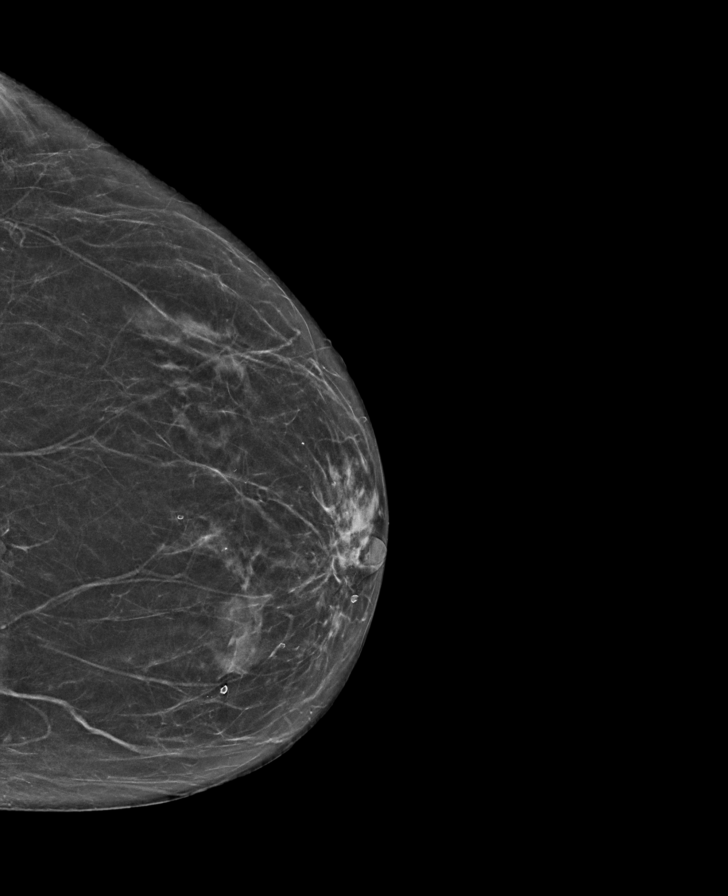

[L CC tomo · tomo slice 27/54.0]
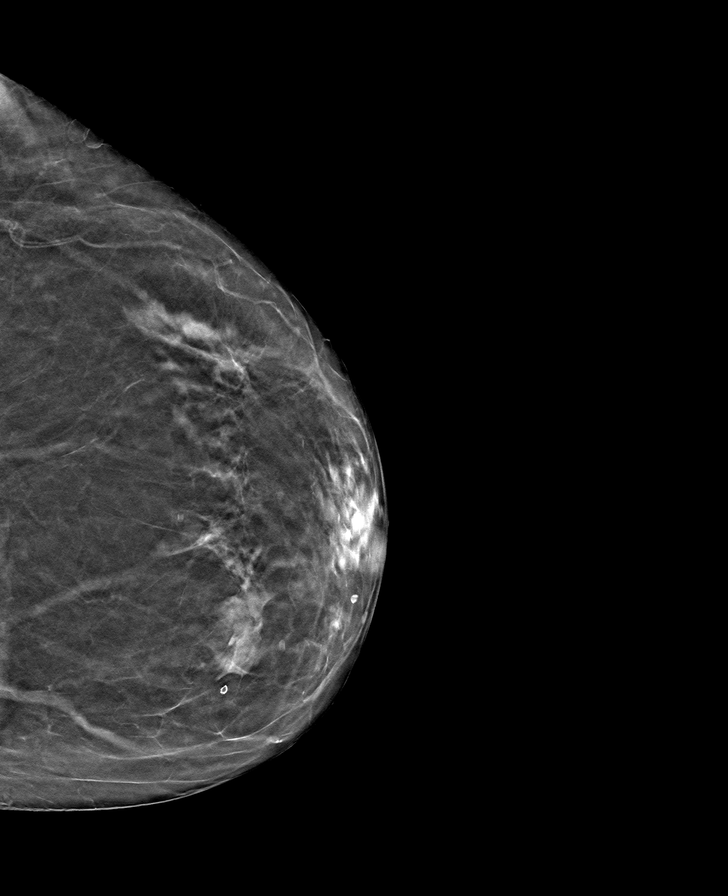

[R MLO tomo · tomo slice 32/63.0]
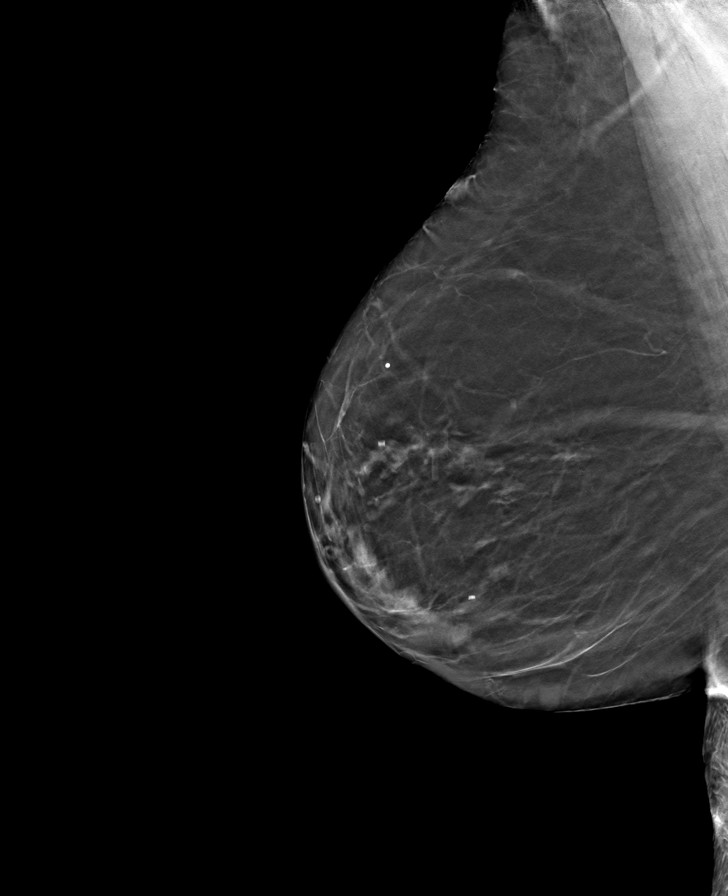

[R CC tomo · tomo slice 29/57.0]
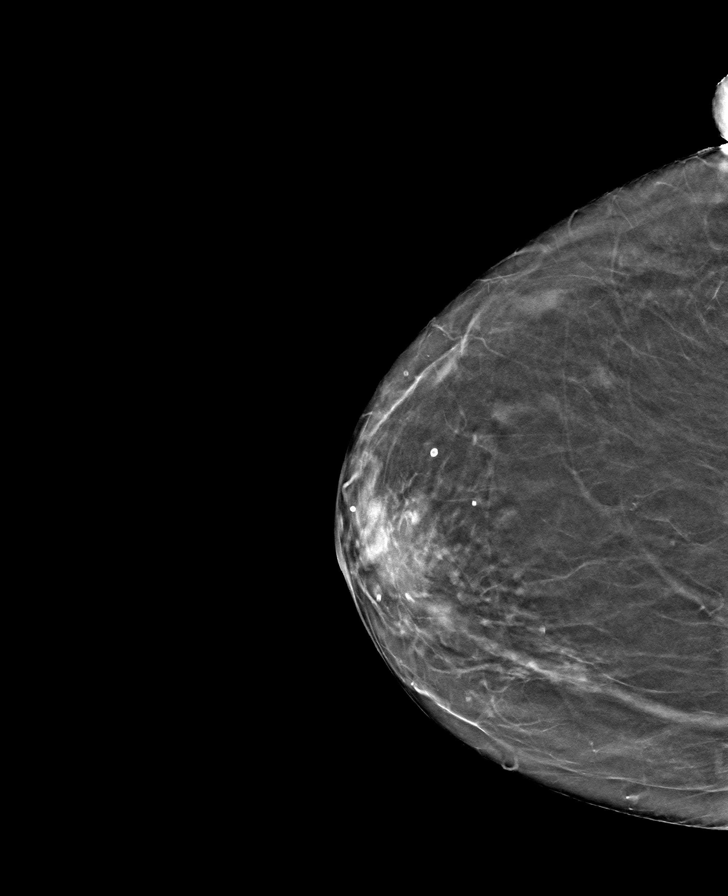

[L MLO tomo · tomo slice 33/64.0]
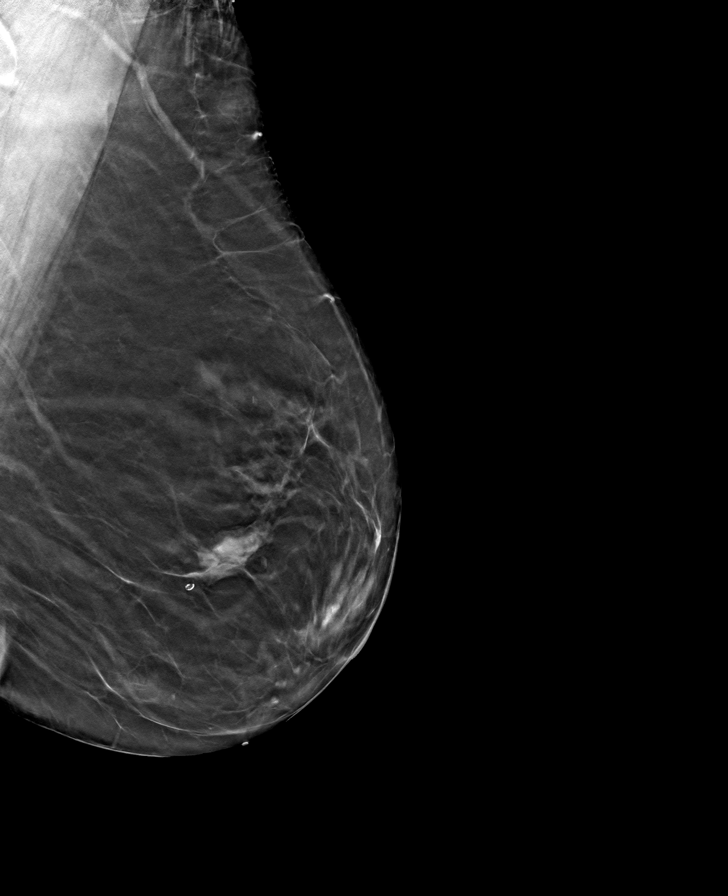

[8 of 24 positions shown; findings below may reference images not displayed]

ACR Breast Density Category b: There are scattered areas of
fibroglandular density.
FINDINGS: There are no findings suspicious for malignancy.
IMPRESSION: No mammographic evidence of malignancy. A result letter of this
screening mammogram will be mailed directly to the patient.

RECOMMENDATION:
Screening mammogram in one year. (Code:51-O-LD2)

BI-RADS CATEGORY  1: Negative.

## 2023-09-24 DIAGNOSIS — M545 Low back pain, unspecified: Secondary | ICD-10-CM | POA: Diagnosis not present

## 2023-09-26 DIAGNOSIS — M6281 Muscle weakness (generalized): Secondary | ICD-10-CM | POA: Diagnosis not present

## 2023-10-14 ENCOUNTER — Ambulatory Visit (HOSPITAL_COMMUNITY)
Admission: RE | Admit: 2023-10-14 | Discharge: 2023-10-14 | Disposition: A | Discharge status: Home / Self Care | Source: Ambulatory Visit | Attending: Acute Care | Admitting: Acute Care

## 2023-10-14 DIAGNOSIS — R911 Solitary pulmonary nodule: Secondary | ICD-10-CM | POA: Insufficient documentation

## 2023-10-14 DIAGNOSIS — I700 Atherosclerosis of aorta: Secondary | ICD-10-CM | POA: Diagnosis not present

## 2023-10-14 DIAGNOSIS — R918 Other nonspecific abnormal finding of lung field: Secondary | ICD-10-CM | POA: Diagnosis not present

## 2023-10-26 ENCOUNTER — Telehealth: Payer: Self-pay | Admitting: Acute Care

## 2023-10-26 DIAGNOSIS — M6281 Muscle weakness (generalized): Secondary | ICD-10-CM | POA: Diagnosis not present

## 2023-10-26 NOTE — Telephone Encounter (Signed)
 Returned call from VM regarding results of LDCT. Patient had nodule follow up LDCT and was anxious to get results.  Advised results are not in but call placed to reading room to request the results.  Will contact patient once results are received.  Patient appreciative of assistance and requests call to review results.

## 2023-10-28 NOTE — Telephone Encounter (Signed)
Results given to provider for review

## 2023-10-29 ENCOUNTER — Telehealth: Payer: Self-pay | Admitting: Acute Care

## 2023-10-29 ENCOUNTER — Other Ambulatory Visit: Payer: Self-pay | Admitting: Acute Care

## 2023-10-29 DIAGNOSIS — R911 Solitary pulmonary nodule: Secondary | ICD-10-CM

## 2023-10-29 NOTE — Telephone Encounter (Signed)
 Results and plan to PCP. Reminder set for OV appt.

## 2023-10-29 NOTE — Telephone Encounter (Signed)
 I have called the patient with the results of her low-dose screening CT that was done 10/14/2023 and read 10/26/2023.  The scan was read as a lung RADS 3, however there is a right upper lobe pulmonary nodule measuring 15.6 mm with a 4.9 mm solid component that has increased in size from 14.8 mm with a 3.9 mm solid component, within a 67-month period of time.  This same nodule measured 6.6 in 2022 and did not have a solid component.  This nodule was PET scan in 2 of 2024 and there was no indication of metabolic activity at that time.  However as it has continued to grow plan will be to repeat PET scan and consider biopsy for tissue diagnosis.  Please fax results to PCP and let them know plan is for PET scan and follow-up in the clinic to determine next best steps in plan of care  Please schedule with Dr. Shelah, Dr. Kara, or Lauraine  Thank you so much

## 2023-10-29 NOTE — Telephone Encounter (Signed)
 See provider note 10/29/2023

## 2023-10-29 NOTE — Telephone Encounter (Signed)
 Slow Growth of Nodule over several years. PET scan 04/2022, showed no hypermetabolic pulmonary findings. Since 04/2022, there has been development of a solid component . This grew from 14.8 mm/ 3.9 mm to 15.6 mm / 4.9 mm from 07/13/2023 to 10/14/2023. ( Average Diameter)  Will discuss with Dr. Tamea to see if she feels we need to re-PET.

## 2023-11-10 DIAGNOSIS — G8929 Other chronic pain: Secondary | ICD-10-CM | POA: Diagnosis not present

## 2023-11-12 ENCOUNTER — Ambulatory Visit (HOSPITAL_COMMUNITY): Admission: RE | Admit: 2023-11-12 | Source: Ambulatory Visit

## 2023-11-19 ENCOUNTER — Telehealth: Payer: Self-pay | Admitting: *Deleted

## 2023-11-19 ENCOUNTER — Ambulatory Visit (HOSPITAL_COMMUNITY)
Admission: RE | Admit: 2023-11-19 | Discharge: 2023-11-19 | Disposition: A | Discharge status: Home / Self Care | Source: Ambulatory Visit | Attending: Acute Care | Admitting: Acute Care

## 2023-11-19 DIAGNOSIS — R911 Solitary pulmonary nodule: Secondary | ICD-10-CM | POA: Insufficient documentation

## 2023-11-19 MED ORDER — FLUDEOXYGLUCOSE F - 18 (FDG) INJECTION
7.3800 | Freq: Once | INTRAVENOUS | Status: AC | PRN
  Administered 2023-11-19: 7.38 via INTRAVENOUS

## 2023-11-19 NOTE — Telephone Encounter (Signed)
 From [facility] RE: incontinence supplies Requesting a letter of medical necessity for incontinence supplies Letter typed up and placed on PCP's desk for signature.

## 2023-11-20 NOTE — Telephone Encounter (Signed)
 LMN faxed to [facility]

## 2023-11-22 ENCOUNTER — Other Ambulatory Visit: Payer: Self-pay | Admitting: Family Medicine

## 2023-11-23 NOTE — Telephone Encounter (Signed)
 Rcvd fax from [facility] needing updated information on [facility] Updated letter and placed on PCP's desk.

## 2023-11-24 ENCOUNTER — Telehealth: Payer: Self-pay | Admitting: Acute Care

## 2023-11-24 ENCOUNTER — Encounter: Payer: Self-pay | Admitting: Emergency Medicine

## 2023-11-24 ENCOUNTER — Ambulatory Visit (INDEPENDENT_AMBULATORY_CARE_PROVIDER_SITE_OTHER): Admitting: Acute Care

## 2023-11-24 ENCOUNTER — Encounter: Payer: Self-pay | Admitting: Acute Care

## 2023-11-24 VITALS — BP 122/58 | HR 75 | Temp 98.5°F | Ht 65.0 in | Wt 137.4 lb

## 2023-11-24 DIAGNOSIS — F17200 Nicotine dependence, unspecified, uncomplicated: Secondary | ICD-10-CM

## 2023-11-24 DIAGNOSIS — J449 Chronic obstructive pulmonary disease, unspecified: Secondary | ICD-10-CM

## 2023-11-24 DIAGNOSIS — R911 Solitary pulmonary nodule: Secondary | ICD-10-CM

## 2023-11-24 DIAGNOSIS — R0609 Other forms of dyspnea: Secondary | ICD-10-CM | POA: Diagnosis not present

## 2023-11-24 DIAGNOSIS — Z23 Encounter for immunization: Secondary | ICD-10-CM

## 2023-11-24 DIAGNOSIS — R9389 Abnormal findings on diagnostic imaging of other specified body structures: Secondary | ICD-10-CM | POA: Diagnosis not present

## 2023-11-24 DIAGNOSIS — F17210 Nicotine dependence, cigarettes, uncomplicated: Secondary | ICD-10-CM

## 2023-11-24 DIAGNOSIS — R942 Abnormal results of pulmonary function studies: Secondary | ICD-10-CM | POA: Diagnosis not present

## 2023-11-24 MED ORDER — ALBUTEROL SULFATE HFA 108 (90 BASE) MCG/ACT IN AERS: 1.0000 | INHALATION_SPRAY | Freq: Four times a day (QID) | RESPIRATORY_TRACT | 5 refills | Status: AC | PRN

## 2023-11-24 NOTE — Telephone Encounter (Signed)
 Letter ready. Letter sent to [facility]. Routing to [employer] for [employer].

## 2023-11-24 NOTE — Patient Instructions (Addendum)
 It is good to see you today. We have reviewed your PET scan results. The right upper lobe nodule of concern in the right middle lobe nodule of concern have low uptake on PET. However with the increase in size they remain concerning for slow-growing adenocarcinoma. You and I discussed an additional CT scan in 3 months versus going ahead and doing the biopsy now. You prefer a biopsy now. I have placed an order for a bronchoscopy with biopsies.  We have discussed the procedure in detail.  We have reviewed the risks and benefits of the procedure. These include bleeding, infection, puncture of the lung, and adverse reaction to anesthesia. You have agreed to proceed with biopsy to evaluate the left upper lobe nodule. Your procedure will be done by Dr. Lamar Chris. You will receive a letter today with date time and information pertaining to the procedure. You will need someone to drive you to the procedure, stay with you during the procedure, and stay with you after the procedure. You will also need someone to stay with you for 24 hours after anesthesia to ensure you have cleared and are doing well. You will follow-up with me 1 week after the procedure to review the results and to ensure you are doing well. Call if you need us  prior to the procedure or if you have any questions at all. Please contact office for sooner follow up if symptoms do not improve or worsen or seek emergency care     Call if you need us  sooner. Please contact office for sooner follow up if symptoms do not improve or worsen or seek emergency care   Please work on quitting smoking. You can receive free nicotine  replacement therapy (patches, gum, or mints) by calling 1-800-QUIT NOW. Please call so we can get you on the path to becoming a non-smoker. I know it is hard, but you can do this!  Hypnosis for smoking cessation  [employer] [phone]  Acupuncture for smoking cessation  [employer] [phone]   Flu vaccine today

## 2023-11-24 NOTE — H&P (View-Only) (Signed)
 History of Present Illness [name] is a 68 y.o. female every day smoker ( 55 pack year smoking history followed through the lung cancer screening program. Referred for lung nodule that is progressive in size. She will be followed by Dr. Shelah.  Pt. Has a significant history of basal cell carcinoma of the skin, multiple sites, and squamous cell carcinoma of the skin, multiple sites.   11/24/2023 Discussed the use of AI scribe software for clinical note transcription with the patient, who gave verbal consent to proceed.  History of Present Illness [name] is a 68 year old female with lung nodules who presents for evaluation of nodule progression.  She has a history of lung nodules, including a 15 mm part-solid nodule in the right upper lobe and an 8 mm nodule in the superior right middle lobe. The right upper lobe nodule has shown progression in size since 2024, while the right middle lobe nodule remains stable. Both nodules exhibit low-grade activity.  We did a PET scan 11/19/2023 to further evaluate the lung nodules noted on the LDCT. This showed that there is low grade uptake , but due to the increase in size, there is suspicion for a low grade adenocarcinoma. We discussed that there are options of continued surveillance vs biopsy now, and patient would prefer to have tissue sampling.  We have reviewed the risks of the procedure, and completed the informed consent process. We have scheduled her with Dr. Shelah 12/07/2023.  She experiences shortness of breath with physical exertion and uses Spiriva  and Symbicort  inhalers. She does not currently use an albuterol  inhaler but has used it in the past and is open to using it again for rescue purposes. No current issues with breathing, but she wears a mask as a precaution.  Her medical history includes kidney stones, although she has not experienced any recent issues, and diverticulitis in her colon.  She continues to smoke.  Smoking/Tobacco Cessation Counseling [name] is a current user of tobacco or nicotine  products. She is considering quitting at this time. Counseling provided today addressed the risks of continued use and the benefits of cessation. Discussed tobacco/nicotine  use history, readiness to quit, and evidence-based treatment options including behavioral strategies, support resources, and pharmacologic therapies. Provided encouragement and educational materials on steps and resources to quit smoking. Patient questions were addressed, and follow-up recommended for continued support. Total time spent on counseling: 3 minutes.       Test Results: PET scan 11/19/2023 15 mm part solid nodule in the posterior right upper lobe shows minimal FDG activity, similar to prior PET in 2014. This nodule does show progression in size and attenuation since prior earlier studies in 2024, suspicious for low-grade adenocarcinoma.   8 mm nodular density in the superior right middle lobe shows mild FDG uptake, but remains stable in both size and FDG activity compared to previous exams. Low-grade adenocarcinoma cannot be excluded.   No evidence of metastatic disease.   Bilateral nephrolithiasis, with mild right hydronephrosis due to 11 mm calculus in the right renal pelvis.   Colonic diverticulosis, without radiographic evidence of diverticulitis.  LDCT 10/14/2023  Part solid nodule in the posterior segment right upper lobe measures 15.6 mm with a 4.9 mm solid component, grossly stable. 4.1 mm right lower lobe nodule, unchanged. Scarring in the lateral segment right middle lobe (4/148). No pleural fluid. Airway is unremarkable. Part solid nodule in the posterior segment right upper lobe is grossly stable. Lung-RADS 3, probably  benign findings. Short-term follow-up in 6 months is recommended with repeat low-dose chest CT without contrast    Latest Ref Rng & Units 06/23/2023   12:09 PM 12/17/2022    2:48 PM  06/17/2022    1:44 PM  CBC  WBC 3.4 - 10.8 x10E3/uL 10.1  11.0  10.3   Hemoglobin 11.1 - 15.9 g/dL 84.9  84.1  84.8   Hematocrit 34.0 - 46.6 % 47.5  49.2  46.3   Platelets 150 - 450 x10E3/uL 408  401  448        Latest Ref Rng & Units 06/23/2023   12:09 PM 12/17/2022    2:48 PM 06/17/2022    1:44 PM  BMP  Glucose 70 - 99 mg/dL 95  98  90   BUN 8 - 27 mg/dL 10  11  13    Creatinine 0.57 - 1.00 mg/dL 9.31  9.26  9.23   BUN/Creat Ratio 12 - 28 15  15  17    Sodium 134 - 144 mmol/L 143  142  143   Potassium 3.5 - 5.2 mmol/L 4.4  4.7  4.6   Chloride 96 - 106 mmol/L 105  102  103   CO2 20 - 29 mmol/L 26  26  26    Calcium  8.7 - 10.3 mg/dL 9.3  9.9  9.6     BNP    Component Value Date/Time   BNP 25.0 03/05/2019 2254    ProBNP No results found for: PROBNP  PFT    Component Value Date/Time   FEV1PRE 1.59 12/05/2019 1353   FEV1POST 1.58 12/05/2019 1353   FVCPRE 2.36 12/05/2019 1353   FVCPOST 2.47 12/05/2019 1353   TLC 6.31 12/05/2019 1353   DLCOUNC 18.45 12/05/2019 1353   PREFEV1FVCRT 67 12/05/2019 1353   PSTFEV1FVCRT 64 12/05/2019 1353    NM PET Image Initial (PI) Skull Base To Thigh Result Date: 11/20/2023 CLINICAL DATA:  Initial treatment strategy for right lung nodule. EXAM: NUCLEAR MEDICINE PET SKULL BASE TO THIGH TECHNIQUE: 7.4 mCi F-18 FDG was injected intravenously. Full-ring PET imaging was performed from the skull base to thigh after the radiotracer. CT data was obtained and used for attenuation correction and anatomic localization. Fasting blood glucose: 104 mg/dl COMPARISON:  CT on 91/86/7974 and PET-CT on 04/03/2022 FINDINGS: Mediastinal blood-pool activity (background): SUV max = 2.8 Liver activity (reference): SUV max = N/A NECK:  No hypermetabolic lymph nodes or masses. Incidental CT findings:  None. CHEST: 15 mm part solid nodule is again seen in the posterior right upper lobe, which is stable since recent CT but does show progression in size and attenuation since  prior PET-CT in 2024. This again shows minimal FDG activity, with SUV max of 1.0. 8 mm irregular nodular density in the superior right middle lobe on image 29/203 also remains stable. This shows mild FDG uptake with SUV max of 1.1, also without significant change compared to prior PET-CT in 2024. No hypermetabolic lymph nodes within the thorax. Incidental CT findings:  None. ABDOMEN/PELVIS: No abnormal hypermetabolic activity within the liver, pancreas, adrenal glands, or spleen. No hypermetabolic lymph nodes in the abdomen or pelvis. Incidental CT findings: Multiple small renal calculi are seen bilaterally. Mild right hydronephrosis is seen with 11 mm calculus in the right renal pelvis. Diverticulosis is seen mainly involving the sigmoid colon, however there is no evidence of diverticulitis. Prior hysterectomy also noted. SKELETON: No focal hypermetabolic bone lesions to suggest skeletal metastasis. Incidental CT findings:  None. IMPRESSION: 15 mm part  solid nodule in the posterior right upper lobe shows minimal FDG activity, similar to prior PET in 2014. This nodule does show progression in size and attenuation since prior earlier studies in 2024, suspicious for low-grade adenocarcinoma. 8 mm nodular density in the superior right middle lobe shows mild FDG uptake, but remains stable in both size and FDG activity compared to previous exams. Low-grade adenocarcinoma cannot be excluded. No evidence of metastatic disease. Bilateral nephrolithiasis, with mild right hydronephrosis due to 11 mm calculus in the right renal pelvis. Colonic diverticulosis, without radiographic evidence of diverticulitis. Electronically Signed   By: Norleen DELENA Kil M.D.   On: 11/20/2023 12:42     Past medical hx Past Medical History:  Diagnosis Date   Allergy    Arthritis    Atypical mole 06/05/2003   slight-mod-Left scapula (WS)   Atypical nevus 07/26/2003   persist. dysp- Left scapula (WS)   Basal cell carcinoma 06/05/2003    RIght chest (CX35FU)   Basal cell carcinoma 05/22/2004   superificial- RIght upper back- (CX35FU)   Basal cell carcinoma 11/27/2004   beside R nostril-(MOHS), below right outer nose (MOHS)   Basal cell carcinoma 11/08/2009   left post shoulder -(txpbx)   Basal cell carcinoma 05/24/2012   ulcerated- Left post shoulder- (CX35FU), ulcerated-mid forehead (EXC )   Basal cell carcinoma 06/24/2012   superificial- Right shoulder - (txpbx)    Basal cell carcinoma 07/29/2012    sclerosis- mid forehead (MOHS)   Basal cell carcinoma 06/06/2019   nod-right anterior neck (CX35FU)   Cataract    Closed compression fracture of L5 vertebra (HCC)    Constipation    uses OTC meds with help   COPD (chronic obstructive pulmonary disease) (HCC)    Coronary artery disease    minimal nonobstructive    COVID    Depression    GERD (gastroesophageal reflux disease)    Heart murmur    Hx of adenomatous polyp of colon 10/03/2014   Hypercholesteremia    Hypertension    Panic attacks    Perimenopausal    Skin cancer    Squamous cell carcinoma of skin 06/05/2003   Right Temple(CX35FU)   Squamous cell carcinoma of skin 05/22/2004   in situ- Left upperarm (CX35FU)   Squamous cell carcinoma of skin 11/27/2004   in situ- above Left elbow (CX35FU)   Squamous cell carcinoma of skin 03/04/2006   in situ- Left sideburn (CX35FU)   Squamous cell carcinoma of skin 11/26/2006   in situ- mid brow (Cx35FU)   Squamous cell carcinoma of skin 05/24/2012   in situ- Left chest- (CX35FU)   Squamous cell carcinoma of skin 04/05/2013   in situ- Left nose (Cx35FU)   Squamous cell carcinoma of skin 08/17/2013   well diff-above Left eye (txpbx)   Squamous cell carcinoma of skin 07/30/2016   in situ- right chest sup (Cx35FU)   Squamous cell carcinoma of skin 05/12/2017   in situ- RIght shoulder (CX35FU), in situ- Left forehead (CX35FU)   Squamous cell carcinoma of skin 11/23/2018   in situ- Right mid shin, inner- (MOHS)    Tobacco user      Social History   Tobacco Use   Smoking status: Every Day    Current packs/day: 1.00    Average packs/day: 1 pack/day for 54.6 years (54.6 ttl pk-yrs)    Types: Cigarettes    Start date: 05/01/1969   Smokeless tobacco: Never   Tobacco comments:    1 pack a day 11/24/2023 KRD  Vaping Use   Vaping status: Never Used  Substance Use Topics   Alcohol use: No    Alcohol/week: 0.0 standard drinks of alcohol   Drug use: No    Comment: per pt, Cocaine was in her system October and November 2016 when she went to the pain clinic 04-02-15.    Ms.Pelcher reports that she has been smoking cigarettes. She started smoking about 54 years ago. She has a 54.6 pack-year smoking history. She has never used smokeless tobacco. She reports that she does not drink alcohol and does not use drugs.  Tobacco Cessation: Ready to quit: Not Answered Counseling given: Not Answered Tobacco comments: 1 pack a day 11/24/2023 KRD Current every day smoker  Past surgical hx, Family hx, Social hx all reviewed.  Current Outpatient Medications on File Prior to Visit  Medication Sig   acetaminophen  (TYLENOL ) 500 MG tablet Take 1,000 mg by mouth every 6 (six) hours as needed for moderate pain.   albuterol  (VENTOLIN  HFA) 108 (90 Base) MCG/ACT inhaler Inhale 2 puffs into the lungs every 6 (six) hours as needed for wheezing or shortness of breath.   alendronate  (FOSAMAX ) 70 MG tablet Take 1 tablet (70 mg total) by mouth every 7 (seven) days. Take with a full glass of water  on an empty stomach. Do not lay down for at least 2 hours   amLODipine  (NORVASC ) 10 MG tablet Take 1 tablet (10 mg total) by mouth daily.   atorvastatin  (LIPITOR) 80 MG tablet Take 1 tablet (80 mg total) by mouth daily.   Buprenorphine HCl-Naloxone  HCl 4-1 MG FILM DISSOLVE 1 FILM UNDER THE TONGUE THREE TIMES DAILY AS NEEDED   carbidopa -levodopa  (SINEMET ) 10-100 MG tablet Take 1 tablet by mouth 3 (three) times daily. For parkinsonism    clonazePAM  (KLONOPIN ) 1 MG tablet Take 1 tablet (1 mg total) by mouth 3 (three) times daily as needed. for anxiety   dexlansoprazole  (DEXILANT ) 60 MG capsule TAKE ONE CAPSULE BY MOUTH DAILY ON AN EMPTY STOMACH   DULoxetine  (CYMBALTA ) 60 MG capsule Take 1 capsule (60 mg total) by mouth 2 (two) times daily.   ezetimibe  (ZETIA ) 10 MG tablet Take 1 tablet (10 mg total) by mouth daily.   gabapentin (NEURONTIN) 600 MG tablet Take 600 mg by mouth 3 (three) times daily.   ipratropium-albuterol  (DUONEB) 0.5-2.5 (3) MG/3ML SOLN Take 3 mLs by nebulization every 6 (six) hours as needed.   levocetirizine (XYZAL ) 5 MG tablet Take 1 tablet (5 mg total) by mouth every evening.   LINZESS  145 MCG CAPS capsule TAKE ONE CAPSULE BY MOUTH DAILY TO regulate bowel movement   lisinopril  (ZESTRIL ) 40 MG tablet Take 1 tablet (40 mg total) by mouth daily.   meclizine  (ANTIVERT ) 25 MG tablet TAKE 1 TABLET BY MOUTH THREE TIMES DAILY AS NEEDED FOR dizziness.   meloxicam (MOBIC) 15 MG tablet Take 15 mg by mouth daily.   methocarbamol  (ROBAXIN ) 750 MG tablet Take 750 mg by mouth every 6 (six) hours as needed.   naproxen sodium (ALEVE) 220 MG tablet Take 220 mg by mouth 2 (two) times daily as needed (pain).   rOPINIRole  (REQUIP ) 3 MG tablet TAKE 1 TABLET BY MOUTH AT BEDTIME (needs TO be seen BEFORE NEXT refill)   SYMBICORT  160-4.5 MCG/ACT inhaler INHALE 2 puffs into THE lungs TWICE DAILY   Tiotropium Bromide  Monohydrate (SPIRIVA  RESPIMAT) 2.5 MCG/ACT AERS INHALE 2 puffs BY MOUTH DAILY   Vitamin D , Ergocalciferol , (DRISDOL ) 1.25 MG (50000 UNIT) CAPS capsule TAKE 1 CAPSULE BY MOUTH  WEEKLY   Current Facility-Administered Medications on File Prior to Visit  Medication   methylPREDNISolone  acetate (DEPO-MEDROL ) injection 80 mg     Allergies  Allergen Reactions   Fentanyl  Other (See Comments)   Lorcet 10-650 [Hydrocodone -Acetaminophen ] Itching   Sulfa  Antibiotics Other (See Comments)    aching   Tape Other (See Comments)     Tear skin. Please use paper tape   Tylenol  [Acetaminophen ] Itching   Wellbutrin  Xl [Bupropion  Hcl Er (Xl)]     Upset stomach /bad taste in mouth    Review Of Systems:  Constitutional:   No  weight loss, night sweats,  Fevers, chills, fatigue, or  lassitude.  HEENT:   No headaches,  Difficulty swallowing,  Tooth/dental problems, or  Sore throat,                No sneezing, itching, ear ache, nasal congestion, post nasal drip,   CV:  No chest pain,  Orthopnea, PND, swelling in lower extremities, anasarca, dizziness, palpitations, syncope.   GI  No heartburn, indigestion, abdominal pain, nausea, vomiting, diarrhea, change in bowel habits, loss of appetite, bloody stools.   Resp: + shortness of breath with exertion or at rest.  No excess mucus, no productive cough,  No non-productive cough,  No coughing up of blood.  No change in color of mucus.  No wheezing.  No chest wall deformity  Skin: no rash or lesions.  GU: no dysuria, change in color of urine, no urgency or frequency.  No flank pain, no hematuria   MS:  No joint pain or swelling.  No decreased range of motion.  No back pain.  Psych:  No change in mood or affect. No depression or anxiety.  No memory loss.   Vital Signs BP (!) 122/58   Pulse 75   Temp 98.5 F (36.9 C) (Oral)   Ht 5' 5 (1.651 m)   Wt 137 lb 6.4 oz (62.3 kg)   SpO2 95%   BMI 22.86 kg/m    Physical Exam:  General- No distress,  A&Ox3 ENT: No sinus tenderness, TM clear, pale nasal mucosa, no oral exudate,no post nasal drip, no LAN Cardiac: S1, S2, regular rate and rhythm, no murmur Chest: No wheeze/ rales/ dullness; no accessory muscle use, no nasal flaring, no sternal retractions, diminished per bases Abd.: Soft Non-tender, ND, BS +, Body mass index is 22.86 kg/m.  Ext: No clubbing cyanosis, edema, no obvious deformities Neuro:  normal strength, MAE x 4, A&O x 3 Skin: No rashes, warm and dry, no obvious skin lesions  Psych: normal mood and  behavior Assessment & Plan Suspicious right lung nodules with interval growth, suspicious for low-grade adenocarcinoma Current every day smoker  History of multiple skin cancers - Schedule bronchoscopy with biopsy on December 07, 2023, with Dr. Shelah. - Follow up one week post-procedure for biopsy results.  Chronic obstructive pulmonary disease (COPD) Reports exertional dyspnea.  Using Spiriva  and Symbicort .  Albuterol  inhaler as rescue - Reorder albuterol  inhaler for rescue    AVS 11/24/2023 We have reviewed your PET scan results. The right upper lobe nodule of concern in the right middle lobe nodule of concern have low uptake on PET. However with the increase in size they remain concerning for slow-growing adenocarcinoma. You and I discussed an additional CT scan in 3 months versus going ahead and doing the biopsy now. You prefer a biopsy now. I have placed an order for a bronchoscopy with biopsies.  We have discussed the procedure  in detail.  We have reviewed the risks and benefits of the procedure. These include bleeding, infection, puncture of the lung, and adverse reaction to anesthesia. You have agreed to proceed with biopsy to evaluate the left upper lobe nodule. Your procedure will be done by Dr. Lamar Chris. You will receive a letter today with date time and information pertaining to the procedure. You will need someone to drive you to the procedure, stay with you during the procedure, and stay with you after the procedure. You will also need someone to stay with you for 24 hours after anesthesia to ensure you have cleared and are doing well. You will follow-up with me 1 week after the procedure to review the results and to ensure you are doing well. Call if you need us  prior to the procedure or if you have any questions at all. Please contact office for sooner follow up if symptoms do not improve or worsen or seek emergency care     Call if you need us  sooner. Please  contact office for sooner follow up if symptoms do not improve or worsen or seek emergency care   Please work on quitting smoking. You can receive free nicotine  replacement therapy (patches, gum, or mints) by calling 1-800-QUIT NOW. Please call so we can get you on the path to becoming a non-smoker. I know it is hard, but you can do this!  Hypnosis for smoking cessation  [employer] [phone]  Acupuncture for smoking cessation  [employer] [phone]   Flu vaccine today  I spent 40 minutes dedicated to the care of this patient on the date of this encounter to include pre-visit review of records, face-to-face time with the patient discussing conditions above, post visit ordering of testing, clinical documentation with the electronic health record, making appropriate referrals as documented, and communicating necessary information to the patient's healthcare team.     Lauraine JULIANNA Lites, NP 11/24/2023  9:25 AM

## 2023-11-24 NOTE — Telephone Encounter (Signed)
 Please schedule the following:  Provider performing procedure: [facility]rum Diagnosis:  Lung nodule Which side if for nodule / mass?  Right  Procedure:  Navigational bronch with biopsies  Has patient been spoken to by Provider and given informed consent?  Chaney Domino NP on 9/23 Anesthesia:  General Do you need Fluro? Yes Duration of procedure: 1.5 hours Date: 12/07/2023 Alternate Date: 12/08/2023  Time: Third case of the day with the 9 ish arrival time Location:  [facility] Does patient have OSA?  No DM? No Or Latex allergy? Yes Medication Restriction/ Anticoagulate/Antiplatelet: No blood thinners Pre-op Labs Ordered:determined by Anesthesia Imaging request:   LDCT done 10/14/2023 (If, SuperDimension CT Chest, please have STAT courier sent to ENDO)

## 2023-11-24 NOTE — Progress Notes (Signed)
 History of Present Illness [name] is a 68 y.o. female every day smoker ( 55 pack year smoking history followed through the lung cancer screening program. Referred for lung nodule that is progressive in size. She will be followed by Dr. Shelah.  Pt. Has a significant history of basal cell carcinoma of the skin, multiple sites, and squamous cell carcinoma of the skin, multiple sites.   11/24/2023 Discussed the use of AI scribe software for clinical note transcription with the patient, who gave verbal consent to proceed.  History of Present Illness [name] is a 68 year old female with lung nodules who presents for evaluation of nodule progression.  She has a history of lung nodules, including a 15 mm part-solid nodule in the right upper lobe and an 8 mm nodule in the superior right middle lobe. The right upper lobe nodule has shown progression in size since 2024, while the right middle lobe nodule remains stable. Both nodules exhibit low-grade activity.  We did a PET scan 11/19/2023 to further evaluate the lung nodules noted on the LDCT. This showed that there is low grade uptake , but due to the increase in size, there is suspicion for a low grade adenocarcinoma. We discussed that there are options of continued surveillance vs biopsy now, and patient would prefer to have tissue sampling.  We have reviewed the risks of the procedure, and completed the informed consent process. We have scheduled her with Dr. Shelah 12/07/2023.  She experiences shortness of breath with physical exertion and uses Spiriva  and Symbicort  inhalers. She does not currently use an albuterol  inhaler but has used it in the past and is open to using it again for rescue purposes. No current issues with breathing, but she wears a mask as a precaution.  Her medical history includes kidney stones, although she has not experienced any recent issues, and diverticulitis in her colon.  She continues to smoke.  Smoking/Tobacco Cessation Counseling [name] is a current user of tobacco or nicotine  products. She is considering quitting at this time. Counseling provided today addressed the risks of continued use and the benefits of cessation. Discussed tobacco/nicotine  use history, readiness to quit, and evidence-based treatment options including behavioral strategies, support resources, and pharmacologic therapies. Provided encouragement and educational materials on steps and resources to quit smoking. Patient questions were addressed, and follow-up recommended for continued support. Total time spent on counseling: 3 minutes.       Test Results: PET scan 11/19/2023 15 mm part solid nodule in the posterior right upper lobe shows minimal FDG activity, similar to prior PET in 2014. This nodule does show progression in size and attenuation since prior earlier studies in 2024, suspicious for low-grade adenocarcinoma.   8 mm nodular density in the superior right middle lobe shows mild FDG uptake, but remains stable in both size and FDG activity compared to previous exams. Low-grade adenocarcinoma cannot be excluded.   No evidence of metastatic disease.   Bilateral nephrolithiasis, with mild right hydronephrosis due to 11 mm calculus in the right renal pelvis.   Colonic diverticulosis, without radiographic evidence of diverticulitis.  LDCT 10/14/2023  Part solid nodule in the posterior segment right upper lobe measures 15.6 mm with a 4.9 mm solid component, grossly stable. 4.1 mm right lower lobe nodule, unchanged. Scarring in the lateral segment right middle lobe (4/148). No pleural fluid. Airway is unremarkable. Part solid nodule in the posterior segment right upper lobe is grossly stable. Lung-RADS 3, probably  benign findings. Short-term follow-up in 6 months is recommended with repeat low-dose chest CT without contrast    Latest Ref Rng & Units 06/23/2023   12:09 PM 12/17/2022    2:48 PM  06/17/2022    1:44 PM  CBC  WBC 3.4 - 10.8 x10E3/uL 10.1  11.0  10.3   Hemoglobin 11.1 - 15.9 g/dL 84.9  84.1  84.8   Hematocrit 34.0 - 46.6 % 47.5  49.2  46.3   Platelets 150 - 450 x10E3/uL 408  401  448        Latest Ref Rng & Units 06/23/2023   12:09 PM 12/17/2022    2:48 PM 06/17/2022    1:44 PM  BMP  Glucose 70 - 99 mg/dL 95  98  90   BUN 8 - 27 mg/dL 10  11  13    Creatinine 0.57 - 1.00 mg/dL 9.31  9.26  9.23   BUN/Creat Ratio 12 - 28 15  15  17    Sodium 134 - 144 mmol/L 143  142  143   Potassium 3.5 - 5.2 mmol/L 4.4  4.7  4.6   Chloride 96 - 106 mmol/L 105  102  103   CO2 20 - 29 mmol/L 26  26  26    Calcium  8.7 - 10.3 mg/dL 9.3  9.9  9.6     BNP    Component Value Date/Time   BNP 25.0 03/05/2019 2254    ProBNP No results found for: PROBNP  PFT    Component Value Date/Time   FEV1PRE 1.59 12/05/2019 1353   FEV1POST 1.58 12/05/2019 1353   FVCPRE 2.36 12/05/2019 1353   FVCPOST 2.47 12/05/2019 1353   TLC 6.31 12/05/2019 1353   DLCOUNC 18.45 12/05/2019 1353   PREFEV1FVCRT 67 12/05/2019 1353   PSTFEV1FVCRT 64 12/05/2019 1353    NM PET Image Initial (PI) Skull Base To Thigh Result Date: 11/20/2023 CLINICAL DATA:  Initial treatment strategy for right lung nodule. EXAM: NUCLEAR MEDICINE PET SKULL BASE TO THIGH TECHNIQUE: 7.4 mCi F-18 FDG was injected intravenously. Full-ring PET imaging was performed from the skull base to thigh after the radiotracer. CT data was obtained and used for attenuation correction and anatomic localization. Fasting blood glucose: 104 mg/dl COMPARISON:  CT on 91/86/7974 and PET-CT on 04/03/2022 FINDINGS: Mediastinal blood-pool activity (background): SUV max = 2.8 Liver activity (reference): SUV max = N/A NECK:  No hypermetabolic lymph nodes or masses. Incidental CT findings:  None. CHEST: 15 mm part solid nodule is again seen in the posterior right upper lobe, which is stable since recent CT but does show progression in size and attenuation since  prior PET-CT in 2024. This again shows minimal FDG activity, with SUV max of 1.0. 8 mm irregular nodular density in the superior right middle lobe on image 29/203 also remains stable. This shows mild FDG uptake with SUV max of 1.1, also without significant change compared to prior PET-CT in 2024. No hypermetabolic lymph nodes within the thorax. Incidental CT findings:  None. ABDOMEN/PELVIS: No abnormal hypermetabolic activity within the liver, pancreas, adrenal glands, or spleen. No hypermetabolic lymph nodes in the abdomen or pelvis. Incidental CT findings: Multiple small renal calculi are seen bilaterally. Mild right hydronephrosis is seen with 11 mm calculus in the right renal pelvis. Diverticulosis is seen mainly involving the sigmoid colon, however there is no evidence of diverticulitis. Prior hysterectomy also noted. SKELETON: No focal hypermetabolic bone lesions to suggest skeletal metastasis. Incidental CT findings:  None. IMPRESSION: 15 mm part  solid nodule in the posterior right upper lobe shows minimal FDG activity, similar to prior PET in 2014. This nodule does show progression in size and attenuation since prior earlier studies in 2024, suspicious for low-grade adenocarcinoma. 8 mm nodular density in the superior right middle lobe shows mild FDG uptake, but remains stable in both size and FDG activity compared to previous exams. Low-grade adenocarcinoma cannot be excluded. No evidence of metastatic disease. Bilateral nephrolithiasis, with mild right hydronephrosis due to 11 mm calculus in the right renal pelvis. Colonic diverticulosis, without radiographic evidence of diverticulitis. Electronically Signed   By: Norleen DELENA Kil M.D.   On: 11/20/2023 12:42     Past medical hx Past Medical History:  Diagnosis Date   Allergy    Arthritis    Atypical mole 06/05/2003   slight-mod-Left scapula (WS)   Atypical nevus 07/26/2003   persist. dysp- Left scapula (WS)   Basal cell carcinoma 06/05/2003    RIght chest (CX35FU)   Basal cell carcinoma 05/22/2004   superificial- RIght upper back- (CX35FU)   Basal cell carcinoma 11/27/2004   beside R nostril-(MOHS), below right outer nose (MOHS)   Basal cell carcinoma 11/08/2009   left post shoulder -(txpbx)   Basal cell carcinoma 05/24/2012   ulcerated- Left post shoulder- (CX35FU), ulcerated-mid forehead (EXC )   Basal cell carcinoma 06/24/2012   superificial- Right shoulder - (txpbx)    Basal cell carcinoma 07/29/2012    sclerosis- mid forehead (MOHS)   Basal cell carcinoma 06/06/2019   nod-right anterior neck (CX35FU)   Cataract    Closed compression fracture of L5 vertebra (HCC)    Constipation    uses OTC meds with help   COPD (chronic obstructive pulmonary disease) (HCC)    Coronary artery disease    minimal nonobstructive    COVID    Depression    GERD (gastroesophageal reflux disease)    Heart murmur    Hx of adenomatous polyp of colon 10/03/2014   Hypercholesteremia    Hypertension    Panic attacks    Perimenopausal    Skin cancer    Squamous cell carcinoma of skin 06/05/2003   Right Temple(CX35FU)   Squamous cell carcinoma of skin 05/22/2004   in situ- Left upperarm (CX35FU)   Squamous cell carcinoma of skin 11/27/2004   in situ- above Left elbow (CX35FU)   Squamous cell carcinoma of skin 03/04/2006   in situ- Left sideburn (CX35FU)   Squamous cell carcinoma of skin 11/26/2006   in situ- mid brow (Cx35FU)   Squamous cell carcinoma of skin 05/24/2012   in situ- Left chest- (CX35FU)   Squamous cell carcinoma of skin 04/05/2013   in situ- Left nose (Cx35FU)   Squamous cell carcinoma of skin 08/17/2013   well diff-above Left eye (txpbx)   Squamous cell carcinoma of skin 07/30/2016   in situ- right chest sup (Cx35FU)   Squamous cell carcinoma of skin 05/12/2017   in situ- RIght shoulder (CX35FU), in situ- Left forehead (CX35FU)   Squamous cell carcinoma of skin 11/23/2018   in situ- Right mid shin, inner- (MOHS)    Tobacco user      Social History   Tobacco Use   Smoking status: Every Day    Current packs/day: 1.00    Average packs/day: 1 pack/day for 54.6 years (54.6 ttl pk-yrs)    Types: Cigarettes    Start date: 05/01/1969   Smokeless tobacco: Never   Tobacco comments:    1 pack a day 11/24/2023 KRD  Vaping Use   Vaping status: Never Used  Substance Use Topics   Alcohol use: No    Alcohol/week: 0.0 standard drinks of alcohol   Drug use: No    Comment: per pt, Cocaine was in her system October and November 2016 when she went to the pain clinic 04-02-15.    Ms.Pelcher reports that she has been smoking cigarettes. She started smoking about 54 years ago. She has a 54.6 pack-year smoking history. She has never used smokeless tobacco. She reports that she does not drink alcohol and does not use drugs.  Tobacco Cessation: Ready to quit: Not Answered Counseling given: Not Answered Tobacco comments: 1 pack a day 11/24/2023 KRD Current every day smoker  Past surgical hx, Family hx, Social hx all reviewed.  Current Outpatient Medications on File Prior to Visit  Medication Sig   acetaminophen  (TYLENOL ) 500 MG tablet Take 1,000 mg by mouth every 6 (six) hours as needed for moderate pain.   albuterol  (VENTOLIN  HFA) 108 (90 Base) MCG/ACT inhaler Inhale 2 puffs into the lungs every 6 (six) hours as needed for wheezing or shortness of breath.   alendronate  (FOSAMAX ) 70 MG tablet Take 1 tablet (70 mg total) by mouth every 7 (seven) days. Take with a full glass of water  on an empty stomach. Do not lay down for at least 2 hours   amLODipine  (NORVASC ) 10 MG tablet Take 1 tablet (10 mg total) by mouth daily.   atorvastatin  (LIPITOR) 80 MG tablet Take 1 tablet (80 mg total) by mouth daily.   Buprenorphine HCl-Naloxone  HCl 4-1 MG FILM DISSOLVE 1 FILM UNDER THE TONGUE THREE TIMES DAILY AS NEEDED   carbidopa -levodopa  (SINEMET ) 10-100 MG tablet Take 1 tablet by mouth 3 (three) times daily. For parkinsonism    clonazePAM  (KLONOPIN ) 1 MG tablet Take 1 tablet (1 mg total) by mouth 3 (three) times daily as needed. for anxiety   dexlansoprazole  (DEXILANT ) 60 MG capsule TAKE ONE CAPSULE BY MOUTH DAILY ON AN EMPTY STOMACH   DULoxetine  (CYMBALTA ) 60 MG capsule Take 1 capsule (60 mg total) by mouth 2 (two) times daily.   ezetimibe  (ZETIA ) 10 MG tablet Take 1 tablet (10 mg total) by mouth daily.   gabapentin (NEURONTIN) 600 MG tablet Take 600 mg by mouth 3 (three) times daily.   ipratropium-albuterol  (DUONEB) 0.5-2.5 (3) MG/3ML SOLN Take 3 mLs by nebulization every 6 (six) hours as needed.   levocetirizine (XYZAL ) 5 MG tablet Take 1 tablet (5 mg total) by mouth every evening.   LINZESS  145 MCG CAPS capsule TAKE ONE CAPSULE BY MOUTH DAILY TO regulate bowel movement   lisinopril  (ZESTRIL ) 40 MG tablet Take 1 tablet (40 mg total) by mouth daily.   meclizine  (ANTIVERT ) 25 MG tablet TAKE 1 TABLET BY MOUTH THREE TIMES DAILY AS NEEDED FOR dizziness.   meloxicam (MOBIC) 15 MG tablet Take 15 mg by mouth daily.   methocarbamol  (ROBAXIN ) 750 MG tablet Take 750 mg by mouth every 6 (six) hours as needed.   naproxen sodium (ALEVE) 220 MG tablet Take 220 mg by mouth 2 (two) times daily as needed (pain).   rOPINIRole  (REQUIP ) 3 MG tablet TAKE 1 TABLET BY MOUTH AT BEDTIME (needs TO be seen BEFORE NEXT refill)   SYMBICORT  160-4.5 MCG/ACT inhaler INHALE 2 puffs into THE lungs TWICE DAILY   Tiotropium Bromide  Monohydrate (SPIRIVA  RESPIMAT) 2.5 MCG/ACT AERS INHALE 2 puffs BY MOUTH DAILY   Vitamin D , Ergocalciferol , (DRISDOL ) 1.25 MG (50000 UNIT) CAPS capsule TAKE 1 CAPSULE BY MOUTH  WEEKLY   Current Facility-Administered Medications on File Prior to Visit  Medication   methylPREDNISolone  acetate (DEPO-MEDROL ) injection 80 mg     Allergies  Allergen Reactions   Fentanyl  Other (See Comments)   Lorcet 10-650 [Hydrocodone -Acetaminophen ] Itching   Sulfa  Antibiotics Other (See Comments)    aching   Tape Other (See Comments)     Tear skin. Please use paper tape   Tylenol  [Acetaminophen ] Itching   Wellbutrin  Xl [Bupropion  Hcl Er (Xl)]     Upset stomach /bad taste in mouth    Review Of Systems:  Constitutional:   No  weight loss, night sweats,  Fevers, chills, fatigue, or  lassitude.  HEENT:   No headaches,  Difficulty swallowing,  Tooth/dental problems, or  Sore throat,                No sneezing, itching, ear ache, nasal congestion, post nasal drip,   CV:  No chest pain,  Orthopnea, PND, swelling in lower extremities, anasarca, dizziness, palpitations, syncope.   GI  No heartburn, indigestion, abdominal pain, nausea, vomiting, diarrhea, change in bowel habits, loss of appetite, bloody stools.   Resp: + shortness of breath with exertion or at rest.  No excess mucus, no productive cough,  No non-productive cough,  No coughing up of blood.  No change in color of mucus.  No wheezing.  No chest wall deformity  Skin: no rash or lesions.  GU: no dysuria, change in color of urine, no urgency or frequency.  No flank pain, no hematuria   MS:  No joint pain or swelling.  No decreased range of motion.  No back pain.  Psych:  No change in mood or affect. No depression or anxiety.  No memory loss.   Vital Signs BP (!) 122/58   Pulse 75   Temp 98.5 F (36.9 C) (Oral)   Ht 5' 5 (1.651 m)   Wt 137 lb 6.4 oz (62.3 kg)   SpO2 95%   BMI 22.86 kg/m    Physical Exam:  General- No distress,  A&Ox3 ENT: No sinus tenderness, TM clear, pale nasal mucosa, no oral exudate,no post nasal drip, no LAN Cardiac: S1, S2, regular rate and rhythm, no murmur Chest: No wheeze/ rales/ dullness; no accessory muscle use, no nasal flaring, no sternal retractions, diminished per bases Abd.: Soft Non-tender, ND, BS +, Body mass index is 22.86 kg/m.  Ext: No clubbing cyanosis, edema, no obvious deformities Neuro:  normal strength, MAE x 4, A&O x 3 Skin: No rashes, warm and dry, no obvious skin lesions  Psych: normal mood and  behavior Assessment & Plan Suspicious right lung nodules with interval growth, suspicious for low-grade adenocarcinoma Current every day smoker  History of multiple skin cancers - Schedule bronchoscopy with biopsy on December 07, 2023, with Dr. Shelah. - Follow up one week post-procedure for biopsy results.  Chronic obstructive pulmonary disease (COPD) Reports exertional dyspnea.  Using Spiriva  and Symbicort .  Albuterol  inhaler as rescue - Reorder albuterol  inhaler for rescue    AVS 11/24/2023 We have reviewed your PET scan results. The right upper lobe nodule of concern in the right middle lobe nodule of concern have low uptake on PET. However with the increase in size they remain concerning for slow-growing adenocarcinoma. You and I discussed an additional CT scan in 3 months versus going ahead and doing the biopsy now. You prefer a biopsy now. I have placed an order for a bronchoscopy with biopsies.  We have discussed the procedure  in detail.  We have reviewed the risks and benefits of the procedure. These include bleeding, infection, puncture of the lung, and adverse reaction to anesthesia. You have agreed to proceed with biopsy to evaluate the left upper lobe nodule. Your procedure will be done by Dr. Lamar Chris. You will receive a letter today with date time and information pertaining to the procedure. You will need someone to drive you to the procedure, stay with you during the procedure, and stay with you after the procedure. You will also need someone to stay with you for 24 hours after anesthesia to ensure you have cleared and are doing well. You will follow-up with me 1 week after the procedure to review the results and to ensure you are doing well. Call if you need us  prior to the procedure or if you have any questions at all. Please contact office for sooner follow up if symptoms do not improve or worsen or seek emergency care     Call if you need us  sooner. Please  contact office for sooner follow up if symptoms do not improve or worsen or seek emergency care   Please work on quitting smoking. You can receive free nicotine  replacement therapy (patches, gum, or mints) by calling 1-800-QUIT NOW. Please call so we can get you on the path to becoming a non-smoker. I know it is hard, but you can do this!  Hypnosis for smoking cessation  [employer] [phone]  Acupuncture for smoking cessation  [employer] [phone]   Flu vaccine today  I spent 40 minutes dedicated to the care of this patient on the date of this encounter to include pre-visit review of records, face-to-face time with the patient discussing conditions above, post visit ordering of testing, clinical documentation with the electronic health record, making appropriate referrals as documented, and communicating necessary information to the patient's healthcare team.     Lauraine JULIANNA Lites, NP 11/24/2023  9:25 AM

## 2023-11-25 DIAGNOSIS — M6281 Muscle weakness (generalized): Secondary | ICD-10-CM | POA: Diagnosis not present

## 2023-11-26 NOTE — Telephone Encounter (Signed)
 Updated signed letter faxed to [facility] at [phone]

## 2023-11-30 ENCOUNTER — Other Ambulatory Visit: Payer: Self-pay | Admitting: Family Medicine

## 2023-12-02 ENCOUNTER — Other Ambulatory Visit: Payer: Self-pay | Admitting: Family Medicine

## 2023-12-03 ENCOUNTER — Other Ambulatory Visit: Payer: Self-pay

## 2023-12-03 ENCOUNTER — Encounter (HOSPITAL_COMMUNITY): Payer: Self-pay | Admitting: Emergency Medicine

## 2023-12-03 NOTE — Progress Notes (Signed)
 SDW CALL  Patient was given pre-op instructions over the phone. Patient verbalized understanding of instructions provided.   PCP - Dr. Butler Der, [facility]  Cardiologist - Dr. Dorn Ross   Chest x-ray -  EKG - DOS Stress Test - 03/11/2018  ECHO - 12/11/2022 Cardiac Cath - 06/23/2006  Sleep Study - denies CPAP -   Fasting Blood Sugar - n/a Checks Blood Sugar _____ times a day  Blood Thinner Instructions: NO NSAIDs Aspirin  Instructions:  ERAS Protcol - NPO except for clears until 745   Anesthesia review: Yes, cardiac history  Patient denies shortness of breath, fever and chest pain over the phone call. Pt endorses chronic cough.

## 2023-12-04 ENCOUNTER — Encounter (HOSPITAL_COMMUNITY): Payer: Self-pay | Admitting: Emergency Medicine

## 2023-12-04 NOTE — Anesthesia Preprocedure Evaluation (Addendum)
 Anesthesia Evaluation  Patient identified by MRN, date of birth, ID band Patient awake    Reviewed: Allergy & Precautions, NPO status , Patient's Chart, lab work & pertinent test results  History of Anesthesia Complications Negative for: history of anesthetic complications  Airway Mallampati: II  TM Distance: >3 FB Neck ROM: Full    Dental  (+) Dental Advisory Given, Edentulous Upper, Edentulous Lower   Pulmonary neg sleep apnea, COPD,  COPD inhaler, neg recent URI, Current Smoker and Patient abstained from smoking.   breath sounds clear to auscultation       Cardiovascular hypertension, Pt. on medications + CAD   Rhythm:Regular   1. Left ventricular ejection fraction, by estimation, is 70 to 75%. The  left ventricle has hyperdynamic function. The left ventricle has no  regional wall motion abnormalities. Left ventricular diastolic parameters  are consistent with Grade I diastolic  dysfunction (impaired relaxation).   2. Right ventricular systolic function is normal. The right ventricular  size is normal.   3. The mitral valve is normal in structure. No evidence of mitral valve  regurgitation. No evidence of mitral stenosis.   4. The aortic valve was not well visualized. There is mild calcification  of the aortic valve. There is mild thickening of the aortic valve. Aortic  valve regurgitation is not visualized. Moderate aortic valve stenosis.  Aortic valve mean gradient measures   22.0 mmHg. Aortic valve peak gradient measures 39.2 mmHg. Aortic valve  area, by VTI measures 1.27 cm.     Neuro/Psych  PSYCHIATRIC DISORDERS Anxiety Depression    negative neurological ROS     GI/Hepatic Neg liver ROS,GERD  ,,  Endo/Other  negative endocrine ROS    Renal/GU negative Renal ROSLab Results      Component                Value               Date                      NA                       143                 12/07/2023                 K                        3.5                 12/07/2023                CO2                      28                  12/07/2023                GLUCOSE                  104 (H)             12/07/2023                BUN                      10  12/07/2023                CREATININE               0.72                12/07/2023                CALCIUM                   9.2                 12/07/2023                EGFR                     94                  06/23/2023                GFRNONAA                 >60                 12/07/2023                Musculoskeletal  (+) Arthritis ,    Abdominal   Peds  Hematology Lab Results      Component                Value               Date                      WBC                      10.3                12/07/2023                HGB                      15.0                12/07/2023                HCT                      46.0                12/07/2023                MCV                      95.8                12/07/2023                PLT                      368                 12/07/2023              Anesthesia Other Findings   Reproductive/Obstetrics                              Anesthesia Physical Anesthesia  Plan  ASA: 3  Anesthesia Plan: General   Post-op Pain Management: Minimal or no pain anticipated   Induction: Intravenous  PONV Risk Score and Plan: 2 and Ondansetron , Propofol infusion and TIVA  Airway Management Planned: Oral ETT  Additional Equipment: None  Intra-op Plan:   Post-operative Plan: Extubation in OR  Informed Consent: I have reviewed the patients History and Physical, chart, labs and discussed the procedure including the risks, benefits and alternatives for the proposed anesthesia with the patient or authorized representative who has indicated his/her understanding and acceptance.     Dental advisory given  Plan Discussed with: CRNA  Anesthesia Plan  Comments: (PAT note written 12/04/2023 by Allison Zelenak, PA-C.  )         Anesthesia Quick Evaluation

## 2023-12-04 NOTE — Progress Notes (Signed)
 Anesthesia Chart Review: SAME DAY WORK-UP  Case: [id number] Date/Time: 12/07/23 1045   Procedure: VIDEO BRONCHOSCOPY WITH ENDOBRONCHIAL NAVIGATION (Right)   Anesthesia type: General   Diagnosis: Lung nodule seen on imaging study [R91.1]   Pre-op diagnosis: lung nodule   [facility]: [facility] / [facility]   Surgeons: Shelah Lamar RAMAN, MD       DISCUSSION: Patient is a 68 year old female scheduled for the above procedure.  She has been followed through the lung cancer screening program and had a progressive RUL lung nodule.  Above procedure recommended.  There is suspicion for low-grade adenocarcinoma.  History includes smoking, COPD, HTN, hypercholesterolemia, CAD (30-40% LCX 2008), murmur (moderate AS 12/2022), Parkinson disease, GERD, panic attacks, skin cancer (BCC, SCC). Chronic cough per patient.   She is followed by cardiologist Dr. Dorn Ross for nonobstructive CAD, HTN, HLD and aortic stenosis. Last visit was on 07/24/2023 with Franchester Bouillon, PA-C. She notes a long history of chest pain with prior testing showing mild non-obstructive CAD  (30-40% LCX by 2008 LHC). Non-ischemic stress test in 2020. She also has known chronic SOB on 01/31/2024. Echo in 12/2022 showed LVEF 70-75%, no Wma, G1DD, mild calcification of aortic valve, moderate aortic valve stenosis, mean gradient peak gradient measures 39.2 mmHg. Follow-up in six months with echo planned. Next visit scheduled for 01/04/2024.   She is a same-day workup.  Anesthesia team to evaluate on the day of surgery.    VS:  Wt Readings from Last 3 Encounters:  11/24/23 62.3 kg  09/10/23 63 kg  08/24/23 62.6 kg   BP Readings from Last 3 Encounters:  11/24/23 (!) 122/58  09/10/23 133/75  08/24/23 113/70   Pulse Readings from Last 3 Encounters:  11/24/23 75  09/10/23 76  08/24/23 84     PROVIDERS: Zollie Lowers, MD is PCP  Ross Dorn, MD is cardiologist Okey Reding, MD is  psychiatrist Mannam, Praveen, MD is pulmonologist   LABS: For day of surgery as indicated.  Most recent results in [facility] include: Lab Results  Component Value Date   WBC 10.1 06/23/2023   HGB 15.0 06/23/2023   HCT 47.5 (H) 06/23/2023   PLT 408 06/23/2023   GLUCOSE 95 06/23/2023   CHOL 145 06/23/2023   TRIG 130 06/23/2023   HDL 43 06/23/2023   LDLCALC 79 06/23/2023   ALT 13 06/23/2023   AST 12 06/23/2023   NA 143 06/23/2023   K 4.4 06/23/2023   CL 105 06/23/2023   CREATININE 0.68 06/23/2023   BUN 10 06/23/2023   CO2 26 06/23/2023    IMAGES: PET Scan 11/19/2023: IMPRESSION: - 15 mm part solid nodule in the posterior right upper lobe shows minimal FDG activity, similar to prior PET in 2014. This nodule does show progression in size and attenuation since prior earlier studies in 2024, suspicious for low-grade adenocarcinoma. - 8 mm nodular density in the superior right middle lobe shows mild FDG uptake, but remains stable in both size and FDG activity compared to previous exams. Low-grade adenocarcinoma cannot be excluded. - No evidence of metastatic disease. - Bilateral nephrolithiasis, with mild right hydronephrosis due to 11 mm calculus in the right renal pelvis. - Colonic diverticulosis, without radiographic evidence of diverticulitis.   CT Chest 10/14/2023: IMPRESSION: 1. Part solid nodule in the posterior segment right upper lobe is grossly stable. Lung-RADS 3, probably benign findings. Short-term follow-up in 6 months is recommended with repeat low-dose chest CT without contrast (please use the following order,  CT CHEST LCS NODULE FOLLOW-UP W/O CM). 2. Aortic atherosclerosis (ICD10-I70.0). Coronary artery calcification.    EKG: Per day of surgery as indicated.  Last EKG noted is from 12/01/2022: Sinus rhythm with Premature atrial complexes Nonspecific ST and T wave abnormality When compared with ECG of 28-Mar-2021 12:32, PREVIOUS ECG IS PRESENT Confirmed by  Alvan Carrier [phone]) on 12/01/2022 2:23:16 PM   CV: Echo 12/11/2022: IMPRESSIONS   1. Left ventricular ejection fraction, by estimation, is 70 to 75%. The  left ventricle has hyperdynamic function. The left ventricle has no  regional wall motion abnormalities. Left ventricular diastolic parameters  are consistent with Grade I diastolic  dysfunction (impaired relaxation).   2. Right ventricular systolic function is normal. The right ventricular  size is normal.   3. The mitral valve is normal in structure. No evidence of mitral valve  regurgitation. No evidence of mitral stenosis.   4. The aortic valve was not well visualized. There is mild calcification  of the aortic valve. There is mild thickening of the aortic valve. Aortic  valve regurgitation is not visualized. Moderate aortic valve stenosis.  Aortic valve mean gradient measures   22.0 mmHg. Aortic valve peak gradient measures 39.2 mmHg. Aortic valve  area, by VTI measures 1.27 cm.  - Comparison(s): EF 60-65%. Mild aortic stenosis-Mean 12.0 mmHg.    Nuclear stress test 03/11/2018: There was no ST segment deviation noted during stress. Defect 1: There is a medium defect of mild severity present in the basal inferior, mid inferior and apical inferior location. This is likely due to soft tissue attenuation. No evidence of ischemia. This is a low risk study. Nuclear stress EF: 58%.    US  Carotid 10/11/2009: IMPRESSION:  Plaque formation bilaterally in the carotid systems with associated  turbulent flow at areas of plaque formation in the distal bulbs and  proximal internal carotid arteries, as well as at areas of  tortuosity in the internal carotid arteries particularly distal  left ICA.  No evidence of hemodynamically significant stenosis.   Cardiac cath 06/23/2006:  ASSESSMENT:  1. Nonobstructive coronary artery disease, predominantly involving the      left circumflex system (30-40%).  2. Normal left ventricular  function.  3. Intracoronary vascular ultrasound demonstration of minimal plaque      in the right coronary artery.    PLAN:  [name] will require aggressive medical therapy.  She needs  tobacco cessation and progressive statin therapy. Past Medical History:  Diagnosis Date   Allergy    Arthritis    Atypical mole 06/05/2003   slight-mod-Left scapula (WS)   Atypical nevus 07/26/2003   persist. dysp- Left scapula (WS)   Basal cell carcinoma 06/05/2003   RIght chest (CX35FU)   Basal cell carcinoma 05/22/2004   superificial- RIght upper back- (CX35FU)   Basal cell carcinoma 11/27/2004   beside R nostril-(MOHS), below right outer nose (MOHS)   Basal cell carcinoma 11/08/2009   left post shoulder -(txpbx)   Basal cell carcinoma 05/24/2012   ulcerated- Left post shoulder- (CX35FU), ulcerated-mid forehead (EXC )   Basal cell carcinoma 06/24/2012   superificial- Right shoulder - (txpbx)    Basal cell carcinoma 07/29/2012    sclerosis- mid forehead (MOHS)   Basal cell carcinoma 06/06/2019   nod-right anterior neck (CX35FU)   Cataract    Closed compression fracture of L5 vertebra (HCC)    Constipation    uses OTC meds with help   COPD (chronic obstructive pulmonary disease) (HCC)    Coronary  artery disease    minimal nonobstructive    COVID    Depression    GERD (gastroesophageal reflux disease)    Heart murmur    History of kidney stones    Hx of adenomatous polyp of colon 10/03/2014   Hypercholesteremia    Hypertension    Panic attacks    Parkinson disease (HCC)    Perimenopausal    Skin cancer    Squamous cell carcinoma of skin 06/05/2003   Right [place](CX35FU)   Squamous cell carcinoma of skin 05/22/2004   in situ- Left upperarm (CX35FU)   Squamous cell carcinoma of skin 11/27/2004   in situ- above Left elbow (CX35FU)   Squamous cell carcinoma of skin 03/04/2006   in situ- Left sideburn (CX35FU)   Squamous cell carcinoma of skin 11/26/2006   in situ- mid brow  (Cx35FU)   Squamous cell carcinoma of skin 05/24/2012   in situ- Left chest- (CX35FU)   Squamous cell carcinoma of skin 04/05/2013   in situ- Left nose (Cx35FU)   Squamous cell carcinoma of skin 08/17/2013   well diff-above Left eye (txpbx)   Squamous cell carcinoma of skin 07/30/2016   in situ- right chest sup (Cx35FU)   Squamous cell carcinoma of skin 05/12/2017   in situ- RIght shoulder (CX35FU), in situ- Left forehead (CX35FU)   Squamous cell carcinoma of skin 11/23/2018   in situ- Right mid shin, inner- (MOHS)   Tobacco user     Past Surgical History:  Procedure Laterality Date   ABDOMINAL HYSTERECTOMY     BACK SURGERY     Spinal Fusion   CATARACT EXTRACTION W/PHACO Right 03/01/2021   Procedure: CATARACT EXTRACTION PHACO AND INTRAOCULAR LENS PLACEMENT (IOC);  Surgeon: Harrie Agent, MD;  Location: [facility];  Service: Ophthalmology;  Laterality: Right;  CDE: 7.31   CATARACT EXTRACTION W/PHACO Left 05/16/2021   Procedure: CATARACT EXTRACTION PHACO AND INTRAOCULAR LENS PLACEMENT (IOC);  Surgeon: Harrie Agent, MD;  Location: [facility];  Service: Ophthalmology;  Laterality: Left;  CDE 9.41    COLONOSCOPY     IR VERTEBROPLASTY CERV/THOR BX INC UNI/BIL INC/INJECT/IMAGING  04/16/2021   Right foot toe surgery     UPPER GASTROINTESTINAL ENDOSCOPY     vocal cord     polyp removal    MEDICATIONS:  methylPREDNISolone  acetate (DEPO-MEDROL ) injection 80 mg    acetaminophen  (TYLENOL ) 500 MG tablet   albuterol  (VENTOLIN  HFA) 108 (90 Base) MCG/ACT inhaler   alendronate  (FOSAMAX ) 70 MG tablet   amLODipine  (NORVASC ) 10 MG tablet   atorvastatin  (LIPITOR) 80 MG tablet   Buprenorphine HCl-Naloxone  HCl 4-1 MG FILM   carbidopa -levodopa  (SINEMET  IR) 10-100 MG tablet   clonazePAM  (KLONOPIN ) 1 MG tablet   dexlansoprazole  (DEXILANT ) 60 MG capsule   DULoxetine  (CYMBALTA ) 60 MG capsule   ezetimibe  (ZETIA ) 10 MG tablet   gabapentin (NEURONTIN) 600 MG tablet   ipratropium-albuterol  (DUONEB)  0.5-2.5 (3) MG/3ML SOLN   levocetirizine (XYZAL ) 5 MG tablet   LINZESS  145 MCG CAPS capsule   lisinopril  (ZESTRIL ) 40 MG tablet   meclizine  (ANTIVERT ) 25 MG tablet   meloxicam (MOBIC) 15 MG tablet   methocarbamol  (ROBAXIN ) 750 MG tablet   naproxen sodium (ALEVE) 220 MG tablet   rOPINIRole  (REQUIP ) 3 MG tablet   SYMBICORT  160-4.5 MCG/ACT inhaler   Tiotropium Bromide  Monohydrate (SPIRIVA  RESPIMAT) 2.5 MCG/ACT AERS   Vitamin D , Ergocalciferol , (DRISDOL ) 1.25 MG (50000 UNIT) CAPS capsule    Kerry Ruder, PA-C Surgical Short Stay/Anesthesiology [facility] Phone [phone] [facility] Phone [phone])  [phone] 12/04/2023 2:58 PM

## 2023-12-07 ENCOUNTER — Encounter (HOSPITAL_COMMUNITY): Payer: Self-pay | Admitting: Emergency Medicine

## 2023-12-07 ENCOUNTER — Ambulatory Visit (HOSPITAL_COMMUNITY)
Admission: RE | Admit: 2023-12-07 | Discharge: 2023-12-07 | Disposition: A | Discharge status: Home / Self Care | Attending: Emergency Medicine | Admitting: Emergency Medicine

## 2023-12-07 ENCOUNTER — Ambulatory Visit (HOSPITAL_BASED_OUTPATIENT_CLINIC_OR_DEPARTMENT_OTHER): Payer: Self-pay | Admitting: Vascular Surgery

## 2023-12-07 ENCOUNTER — Ambulatory Visit (HOSPITAL_COMMUNITY): Payer: Self-pay | Admitting: Vascular Surgery

## 2023-12-07 ENCOUNTER — Ambulatory Visit (HOSPITAL_COMMUNITY)

## 2023-12-07 ENCOUNTER — Other Ambulatory Visit: Payer: Self-pay

## 2023-12-07 ENCOUNTER — Encounter (HOSPITAL_COMMUNITY)
Admission: RE | Disposition: A | Discharge status: Home / Self Care | Payer: Self-pay | Source: Home / Self Care | Attending: Emergency Medicine

## 2023-12-07 DIAGNOSIS — C3411 Malignant neoplasm of upper lobe, right bronchus or lung: Secondary | ICD-10-CM | POA: Diagnosis not present

## 2023-12-07 DIAGNOSIS — I10 Essential (primary) hypertension: Secondary | ICD-10-CM

## 2023-12-07 DIAGNOSIS — I771 Stricture of artery: Secondary | ICD-10-CM | POA: Diagnosis not present

## 2023-12-07 DIAGNOSIS — R911 Solitary pulmonary nodule: Secondary | ICD-10-CM | POA: Insufficient documentation

## 2023-12-07 DIAGNOSIS — I2510 Atherosclerotic heart disease of native coronary artery without angina pectoris: Secondary | ICD-10-CM | POA: Diagnosis not present

## 2023-12-07 DIAGNOSIS — J984 Other disorders of lung: Secondary | ICD-10-CM | POA: Insufficient documentation

## 2023-12-07 DIAGNOSIS — R918 Other nonspecific abnormal finding of lung field: Secondary | ICD-10-CM | POA: Diagnosis not present

## 2023-12-07 DIAGNOSIS — F17210 Nicotine dependence, cigarettes, uncomplicated: Secondary | ICD-10-CM

## 2023-12-07 DIAGNOSIS — Z48813 Encounter for surgical aftercare following surgery on the respiratory system: Secondary | ICD-10-CM | POA: Diagnosis not present

## 2023-12-07 DIAGNOSIS — I700 Atherosclerosis of aorta: Secondary | ICD-10-CM | POA: Diagnosis not present

## 2023-12-07 HISTORY — DX: Parkinson's disease without dyskinesia, without mention of fluctuations: G20.A1

## 2023-12-07 HISTORY — DX: Personal history of urinary calculi: Z87.442

## 2023-12-07 HISTORY — PX: VIDEO BRONCHOSCOPY WITH ENDOBRONCHIAL NAVIGATION: SHX6175

## 2023-12-07 LAB — BASIC METABOLIC PANEL WITH GFR
Anion gap: 8 (ref 5–15)
BUN: 10 mg/dL (ref 8–23)
CO2: 28 mmol/L (ref 22–32)
Calcium: 9.2 mg/dL (ref 8.9–10.3)
Chloride: 107 mmol/L (ref 98–111)
Creatinine, Ser: 0.72 mg/dL (ref 0.44–1.00)
GFR, Estimated: >60 mL/min (ref >60)
Glucose, Bld: 104 mg/dL — ABNORMAL HIGH (ref 70–99)
Potassium: 3.5 mmol/L (ref 3.5–5.1)
Sodium: 143 mmol/L (ref 135–145)

## 2023-12-07 LAB — CBC
HCT: 46 % (ref 36.0–46.0)
Hemoglobin: 15 g/dL (ref 12.0–15.0)
MCH: 31.3 pg (ref 26.0–34.0)
MCHC: 32.6 g/dL (ref 30.0–36.0)
MCV: 95.8 fL (ref 80.0–100.0)
Platelets: 368 K/uL (ref 150–400)
RBC: 4.8 MIL/uL (ref 3.87–5.11)
RDW: 14.9 % (ref 11.5–15.5)
WBC: 10.3 K/uL (ref 4.0–10.5)
nRBC: 0 % (ref 0.0–0.2)

## 2023-12-07 SURGERY — VIDEO BRONCHOSCOPY WITH ENDOBRONCHIAL NAVIGATION
Anesthesia: General | Laterality: Right

## 2023-12-07 MED ORDER — FENTANYL CITRATE (PF) 250 MCG/5ML IJ SOLN
INTRAMUSCULAR | Status: DC | PRN
  Administered 2023-12-07: 25 ug via INTRAVENOUS
  Administered 2023-12-07: 50 ug via INTRAVENOUS
  Administered 2023-12-07: 25 ug via INTRAVENOUS

## 2023-12-07 MED ORDER — DEXAMETHASONE SODIUM PHOSPHATE 10 MG/ML IJ SOLN
INTRAMUSCULAR | Status: DC | PRN
  Administered 2023-12-07: 10 mg via INTRAVENOUS

## 2023-12-07 MED ORDER — FENTANYL CITRATE (PF) 100 MCG/2ML IJ SOLN
INTRAMUSCULAR | Status: AC
  Filled 2023-12-07: qty 2

## 2023-12-07 MED ORDER — CHLORHEXIDINE GLUCONATE 0.12 % MT SOLN
OROMUCOSAL | Status: AC
  Administered 2023-12-07: 15 mL
  Filled 2023-12-07: qty 15

## 2023-12-07 MED ORDER — ONDANSETRON HCL 4 MG/2ML IJ SOLN
INTRAMUSCULAR | Status: DC | PRN
  Administered 2023-12-07: 4 mg via INTRAVENOUS

## 2023-12-07 MED ORDER — PROPOFOL 500 MG/50ML IV EMUL
INTRAVENOUS | Status: DC | PRN
  Administered 2023-12-07: 110 ug via INTRAVENOUS
  Administered 2023-12-07: 100 ug via INTRAVENOUS
  Administered 2023-12-07: 115 ug/kg/min via INTRAVENOUS

## 2023-12-07 MED ORDER — PROPOFOL 10 MG/ML IV BOLUS
INTRAVENOUS | Status: DC | PRN
  Administered 2023-12-07: 100 mg via INTRAVENOUS

## 2023-12-07 MED ORDER — ROCURONIUM BROMIDE 10 MG/ML (PF) SYRINGE
PREFILLED_SYRINGE | INTRAVENOUS | Status: DC | PRN
  Administered 2023-12-07: 50 mg via INTRAVENOUS
  Administered 2023-12-07: 10 mg via INTRAVENOUS

## 2023-12-07 MED ORDER — PHENYLEPHRINE 80 MCG/ML (10ML) SYRINGE FOR IV PUSH (FOR BLOOD PRESSURE SUPPORT)
PREFILLED_SYRINGE | INTRAVENOUS | Status: DC | PRN
  Administered 2023-12-07 (×2): 160 ug via INTRAVENOUS
  Administered 2023-12-07: 80 ug via INTRAVENOUS
  Administered 2023-12-07 (×2): 160 ug via INTRAVENOUS
  Administered 2023-12-07: 200 ug via INTRAVENOUS

## 2023-12-07 MED ORDER — SUGAMMADEX SODIUM 200 MG/2ML IV SOLN
INTRAVENOUS | Status: DC | PRN
  Administered 2023-12-07: 200 mg via INTRAVENOUS

## 2023-12-07 MED ORDER — LACTATED RINGERS IV SOLN: INTRAVENOUS | Status: DC

## 2023-12-07 MED ORDER — LIDOCAINE 2% (20 MG/ML) 5 ML SYRINGE
INTRAMUSCULAR | Status: DC | PRN
  Administered 2023-12-07: 60 mg via INTRAVENOUS

## 2023-12-07 SURGICAL SUPPLY — 39 items
ADAPTER BRONCHOSCOPE OLYMPUS (ADAPTER) ×2 IMPLANT
ADAPTER VALVE BIOPSY EBUS (MISCELLANEOUS) IMPLANT
BAG COUNTER SPONGE SURGICOUNT (BAG) ×2 IMPLANT
BRUSH CYTOL CELLEBRITY 1.5X140 (MISCELLANEOUS) ×2 IMPLANT
BRUSH SUPERTRAX BIOPSY (INSTRUMENTS) IMPLANT
BRUSH SUPERTRAX NDL-TIP CYTO (INSTRUMENTS) ×2 IMPLANT
CANISTER SUCTION 3000ML PPV (SUCTIONS) ×2 IMPLANT
CNTNR URN SCR LID CUP LEK RST (MISCELLANEOUS) ×2 IMPLANT
COVER BACK TABLE 60X90IN (DRAPES) ×2 IMPLANT
FILTER STRAW FLUID ASPIR (MISCELLANEOUS) IMPLANT
FORCEPS BIOP 1.5 SINGLE USE (MISCELLANEOUS) ×2 IMPLANT
FORCEPS BIOP SUPERTRX PREMAR (INSTRUMENTS) ×2 IMPLANT
GAUZE SPONGE 4X4 12PLY STRL (GAUZE/BANDAGES/DRESSINGS) ×2 IMPLANT
GLOVE BIO SURGEON STRL SZ7.5 (GLOVE) ×4 IMPLANT
GOWN STRL REUS W/ TWL LRG LVL3 (GOWN DISPOSABLE) ×4 IMPLANT
KIT CLEAN ENDO COMPLIANCE (KITS) ×2 IMPLANT
KIT LOCATABLE GUIDE (CANNULA) IMPLANT
KIT MARKER FIDUCIAL DELIVERY (KITS) IMPLANT
KIT TURNOVER KIT B (KITS) ×2 IMPLANT
MARKER SKIN DUAL TIP RULER LAB (MISCELLANEOUS) ×2 IMPLANT
NDL SUPERTRX PREMARK BIOPSY (NEEDLE) ×2 IMPLANT
NEEDLE SUPERTRX PREMARK BIOPSY (NEEDLE) ×1 IMPLANT
OIL SILICONE PENTAX (PARTS (SERVICE/REPAIRS)) ×2 IMPLANT
PAD ARMBOARD POSITIONER FOAM (MISCELLANEOUS) ×4 IMPLANT
PATCHES PATIENT (LABEL) ×6 IMPLANT
SOLN 0.9% NACL 1000 ML (IV SOLUTION) ×1 IMPLANT
SOLN 0.9% NACL POUR BTL 1000ML (IV SOLUTION) ×2 IMPLANT
SOLN STERILE WATER 1000 ML (IV SOLUTION) ×1 IMPLANT
SOLN STERILE WATER BTL 1000 ML (IV SOLUTION) ×2 IMPLANT
SYR 20ML ECCENTRIC (SYRINGE) ×2 IMPLANT
SYR 20ML LL LF (SYRINGE) ×2 IMPLANT
SYR 50ML SLIP (SYRINGE) ×2 IMPLANT
TOWEL GREEN STERILE FF (TOWEL DISPOSABLE) ×2 IMPLANT
TRAP SPECIMEN MUCUS 40CC (MISCELLANEOUS) IMPLANT
TUBE CONNECTING 20X1/4 (TUBING) ×2 IMPLANT
UNDERPAD 30X36 HEAVY ABSORB (UNDERPADS AND DIAPERS) ×2 IMPLANT
VALVE BIOPSY SINGLE USE (MISCELLANEOUS) ×2 IMPLANT
VALVE SUCTION BRONCHIO DISP (MISCELLANEOUS) ×2 IMPLANT
superlock fiducial marker IMPLANT

## 2023-12-07 NOTE — Op Note (Signed)
 Video Bronchoscopy with Robotic Assisted Bronchoscopic Navigation   Date of Operation: 12/07/2023   Pre-op Diagnosis: Right upper lobe and right middle lobe pulmonary nodules  Post-op Diagnosis: Same  Surgeon: Lamar Chris  Assistants: None  Anesthesia: General endotracheal anesthesia  Operation: Flexible video fiberoptic bronchoscopy with robotic assistance and biopsies.  Estimated Blood Loss: Minimal  Complications: None  Indications and History: [name] is a 68 y.o. female with history of tobacco use who participates in lung cancer screening program.  She has a slowly enlarging mixed density right upper lobe pulmonary nodule and a stable ground glass right middle lobe nodule.  Recommendation made to achieve a tissue diagnosis via robotic assisted navigational bronchoscopy.  The risks, benefits, complications, treatment options and expected outcomes were discussed with the patient.  The possibilities of pneumothorax, pneumonia, reaction to medication, pulmonary aspiration, perforation of a viscus, bleeding, failure to diagnose a condition and creating a complication requiring transfusion or operation were discussed with the patient who freely signed the consent.    Description of Procedure: The patient was seen in the Preoperative Area, was examined and was deemed appropriate to proceed.  The patient was taken to [facility] Endoscopy room 3, identified as [name] and the procedure verified as Flexible Video Fiberoptic Bronchoscopy.  A Time Out was held and the above information confirmed.   Prior to the date of the procedure a high-resolution CT scan of the chest was performed. Utilizing ION software program a virtual tracheobronchial tree was generated to allow the creation of distinct navigation pathways to the patient's parenchymal abnormalities. After being taken to the operating room general anesthesia was initiated and the patient  was orally intubated. The video fiberoptic  bronchoscope was introduced via the endotracheal tube and a general inspection was performed which showed normal right and left lung anatomy. Aspiration of the bilateral mainstems was completed to remove any remaining secretions. Robotic catheter inserted into patient's endotracheal tube.   Target #1 right upper lobe mixed density pulmonary nodule: The distinct navigation pathways prepared prior to this procedure were then utilized to navigate to patient's lesion identified on CT scan. The robotic catheter was secured into place and the vision probe was withdrawn.  Lesion location was approximated using fluoroscopy.  Local registration and targeting was performed using [employer] Cios mobile C-arm three-dimensional imaging. Under fluoroscopic guidance transbronchial brushings, transbronchial needle biopsies, and transbronchial cryoprobe biopsies were performed to be sent for cytology and pathology.  Needle-in-lesion was confirmed using Cios mobile C-arm.  A bronchioalveolar lavage was performed in the right upper lobe and sent for microbiology.  Under fluoroscopic guidance a single fiducial marker was placed adjacent to the nodule.  Target #2 right middle lobe pulmonary nodule: The distinct navigation pathways prepared prior to this procedure were then utilized to navigate to patient's lesion identified on CT scan. The robotic catheter was secured into place and the vision probe was withdrawn.  Lesion location was approximated using fluoroscopy.  Local registration and targeting was performed using [employer] Cios mobile C-arm three-dimensional imaging. Under fluoroscopic guidance transbronchial brushings, were performed to be sent for cytology.  Needle-in-lesion was confirmed using Cios mobile C-arm.    At the end of the procedure a general airway inspection was performed and there was no evidence of active bleeding. The bronchoscope was removed.  The patient tolerated the procedure  well. There was no significant blood loss and there were no obvious complications. A post-procedural chest x-ray is pending.  Samples Target #1:  1. Transbronchial brushings from right upper lobe nodule 2. Transbronchial Wang needle biopsies from upper lobe nodule 3. Transbronchial cryoprobe biopsies from right upper lobe nodule 4. Bronchoalveolar lavage from right upper lobe  Samples Target #2: 1. Transbronchial brushings from right middle lobe nodule  Plans:  The patient will be discharged from the PACU to home when recovered from anesthesia and after chest x-ray is reviewed. We will review the cytology, pathology and microbiology results with the patient when they become available. Outpatient followup will be with CANDIE Lites, NP and Dr Shelah.   Lamar Shelah, MD, PhD 12/07/2023, 11:33 AM [facility] [phone] or if no answer before 7:00PM call [phone] For any issues after 7:00PM please call eLink [phone]

## 2023-12-07 NOTE — Anesthesia Procedure Notes (Signed)
 Procedure Name: Intubation Date/Time: 12/07/2023 10:38 AM  Performed by: Vera Rochele PARAS, CRNAPre-anesthesia Checklist: Patient identified, Emergency Drugs available, Suction available and Patient being monitored Patient Re-evaluated:Patient Re-evaluated prior to induction Oxygen Delivery Method: Circle System Utilized Preoxygenation: Pre-oxygenation with 100% oxygen Induction Type: IV induction Ventilation: Mask ventilation without difficulty and Oral airway inserted - appropriate to patient size Laryngoscope Size: Miller and 3 Grade View: Grade I Tube type: Oral Tube size: 8.5 mm Number of attempts: 1 Airway Equipment and Method: Stylet and Oral airway Placement Confirmation: ETT inserted through vocal cords under direct vision, positive ETCO2 and breath sounds checked- equal and bilateral Secured at: 21 cm Tube secured with: Tape Dental Injury: Teeth and Oropharynx as per pre-operative assessment

## 2023-12-07 NOTE — Interval H&P Note (Signed)
 History and Physical Interval Note:  12/07/2023 9:06 AM  [name]  has presented today for surgery, with the diagnosis of lung nodule.  The various methods of treatment have been discussed with the patient and family. After consideration of risks, benefits and other options for treatment, the patient has consented to  Procedure(s): VIDEO BRONCHOSCOPY WITH ENDOBRONCHIAL NAVIGATION (Right) as a surgical intervention.  The patient's history has been reviewed, patient examined, no change in status, stable for surgery.  I have reviewed the patient's chart and labs.  Questions were answered to the patient's satisfaction.     Lamar GORMAN Chris

## 2023-12-07 NOTE — Discharge Instructions (Addendum)

## 2023-12-07 NOTE — Op Note (Signed)
 Procedure Note  Patient: [name]  [employer] Cios mobile C-arm was utilized to identify and biopsy right upper lobe mixed density pulmonary nodule.  Imaging was also used to localize a stable right middle lobe nodule.  Needle-in-lesion was confirmed in the right upper lobe nodule using real-time Cios imaging, and images were uploaded to PACS.    Right Upper Lobe mixed-density nodule.   Lamar Chris, MD, PhD 12/07/2023, 11:37 AM [facility] [phone] or if no answer before 7:00PM call [phone] For any issues after 7:00PM please call eLink [phone]

## 2023-12-07 NOTE — Transfer of Care (Signed)
 Immediate Anesthesia Transfer of Care Note  Patient: [name]  Procedure(s) Performed: VIDEO BRONCHOSCOPY WITH ENDOBRONCHIAL NAVIGATION (Right)  Patient Location: PACU  Anesthesia Type:General  Level of Consciousness: awake, alert , and oriented  Airway & Oxygen Therapy: Patient Spontanous Breathing and Patient connected to face mask oxygen  Post-op Assessment: Report given to RN and Post -op Vital signs reviewed and stable  Post vital signs: Reviewed and stable  Last Vitals:  Vitals Value Taken Time  BP    Temp    Pulse 79 12/07/23 11:42  Resp 18 12/07/23 11:42  SpO2 94 % 12/07/23 11:42  Vitals shown include unfiled device data.  Last Pain:  Vitals:   12/07/23 0908  TempSrc:   PainSc: 7       Patients Stated Pain Goal: 3 (12/07/23 0908)  Complications: No notable events documented.

## 2023-12-08 ENCOUNTER — Encounter (HOSPITAL_COMMUNITY): Payer: Self-pay | Admitting: Emergency Medicine

## 2023-12-08 LAB — CYTOLOGY - NON PAP

## 2023-12-09 LAB — ACID FAST SMEAR (AFB, MYCOBACTERIA): Acid Fast Smear: NEGATIVE

## 2023-12-10 ENCOUNTER — Telehealth (HOSPITAL_COMMUNITY): Admitting: Psychiatry

## 2023-12-10 ENCOUNTER — Encounter (HOSPITAL_COMMUNITY): Payer: Self-pay | Admitting: Psychiatry

## 2023-12-10 DIAGNOSIS — F332 Major depressive disorder, recurrent severe without psychotic features: Secondary | ICD-10-CM

## 2023-12-10 DIAGNOSIS — F411 Generalized anxiety disorder: Secondary | ICD-10-CM

## 2023-12-10 LAB — CULTURE, BAL-QUANTITATIVE W GRAM STAIN: Culture: 2000 — AB

## 2023-12-10 MED ORDER — DULOXETINE HCL 60 MG PO CPEP: 60.0000 mg | ORAL_CAPSULE | Freq: Two times a day (BID) | ORAL | 3 refills | Status: DC

## 2023-12-10 MED ORDER — CLONAZEPAM 1 MG PO TABS: 1.0000 mg | ORAL_TABLET | Freq: Three times a day (TID) | ORAL | 2 refills | Status: DC | PRN
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
## 2023-12-12 LAB — AEROBIC/ANAEROBIC CULTURE W GRAM STAIN (SURGICAL/DEEP WOUND): Gram Stain: NONE SEEN

## 2023-12-13 NOTE — Anesthesia Postprocedure Evaluation (Signed)
 Anesthesia Post Note  Patient: [name]  Procedure(s) Performed: VIDEO BRONCHOSCOPY WITH ENDOBRONCHIAL NAVIGATION (Right)     Patient location during evaluation: PACU Anesthesia Type: General Level of consciousness: awake and alert Pain management: pain level controlled Vital Signs Assessment: post-procedure vital signs reviewed and stable Respiratory status: spontaneous breathing, nonlabored ventilation and respiratory function stable Cardiovascular status: blood pressure returned to baseline and stable Postop Assessment: no apparent nausea or vomiting Anesthetic complications: no   No notable events documented.                Ardis Fullwood

## 2023-12-14 ENCOUNTER — Other Ambulatory Visit: Payer: Self-pay | Admitting: Family Medicine

## 2023-12-14 ENCOUNTER — Ambulatory Visit: Admitting: Dermatology

## 2023-12-14 ENCOUNTER — Encounter: Payer: Self-pay | Admitting: Acute Care

## 2023-12-14 ENCOUNTER — Ambulatory Visit (INDEPENDENT_AMBULATORY_CARE_PROVIDER_SITE_OTHER): Admitting: Acute Care

## 2023-12-14 VITALS — BP 126/83 | HR 83 | Temp 98.2°F | Ht 65.0 in | Wt 139.0 lb

## 2023-12-14 DIAGNOSIS — F17200 Nicotine dependence, unspecified, uncomplicated: Secondary | ICD-10-CM

## 2023-12-14 DIAGNOSIS — J22 Unspecified acute lower respiratory infection: Secondary | ICD-10-CM | POA: Diagnosis not present

## 2023-12-14 DIAGNOSIS — R251 Tremor, unspecified: Secondary | ICD-10-CM

## 2023-12-14 DIAGNOSIS — C3490 Malignant neoplasm of unspecified part of unspecified bronchus or lung: Secondary | ICD-10-CM

## 2023-12-14 DIAGNOSIS — R911 Solitary pulmonary nodule: Secondary | ICD-10-CM | POA: Diagnosis not present

## 2023-12-14 DIAGNOSIS — C3411 Malignant neoplasm of upper lobe, right bronchus or lung: Secondary | ICD-10-CM

## 2023-12-14 DIAGNOSIS — F17210 Nicotine dependence, cigarettes, uncomplicated: Secondary | ICD-10-CM | POA: Diagnosis not present

## 2023-12-14 MED ORDER — CIPROFLOXACIN HCL 500 MG PO TABS: 500.0000 mg | ORAL_TABLET | Freq: Two times a day (BID) | ORAL | 0 refills | Status: DC

## 2023-12-14 NOTE — Progress Notes (Signed)
 History of Present Illness Kerry SLIKER is a 68 y.o. female every day smoker ( 55 pack year smoking history followed through the lung cancer screening program. Referred for lung nodule that is progressive in size. She will be followed by Dr. Shelah.   Pt. Has a significant history of basal cell carcinoma of the skin, multiple sites, and squamous cell carcinoma of the skin, multiple sites.   12/14/2023 Discussed the use of AI scribe software for clinical note transcription with the patient, who gave verbal consent to proceed.  History of Present Illness Pt. Presents for follow up after bronchoscopy with biopsies on 12/07/2023. She states she has done well after the procedure. She denies bleeding, worsening shortness of breath, fever, discolored secretions , or adverse reaction to anesthesia.   We have discussed he biopsy results.  They were positive for adenocarcinoma in the right upper lobe.  There was no malignancy detected in the biopsy of the right middle lobe.  PET scan did not show any metastatic disease therefore patient has been referred to thoracic surgery, radiation oncology, and medical oncology for treatment options.  Per pathology there is adequate tissue for additional testing.  Patient last had pulmonary function testing 5 years ago which did indicate she would be a surgical candidate.  She has however continued to smoke and states she is more short of breath now than she was 5 years ago therefore I have ordered repeat pulmonary function testing.  I have also ordered MRI of the brain to complete staging.  There was also abnormal micro collected on October 6.  Sputum cultures specifically the BAL was positive for rare Streptococcus aureus and rare Streptococcus mitis oralis.  Original plan had been to treat with linezolid, however patient is on an SSRI, so she will be treated with ciprofloxacin  for 10 days.  We discussed the importance of using probiotic while she is on the Cipro .  I  told her she could either do activity yogurt or purchase probiotic tablets at the pharmacy.  I explained that I want her to take probiotics twice daily while on the medication and for 10 days after.  I have given her strict callback calls for if she develops any diarrhea, as we understand the risk for C. difficile on this medication.  She verbalized understanding and stated she will give me a call if she develops any diarrhea on this medication.  She also stated that she would do probiotic for 20 days to decrease potential for C. difficile.  While this is not the diagnosis we were hoping for, I have explained to the patient that it appears we caught this early and that she should have good options for treatment.  Patient continues to smoke a little over 1 pack of cigarettes per day.  We discussed the need for her to quit as treatment response is better when people are no longer smoking, and it is just in her overall best interest for her to quit.  We discussed initially she will try nicotine  replacement therapy.  If she is unsuccessful this we will discuss medication.     Test Results: PET scan 11/19/2023 FINAL MICROSCOPIC DIAGNOSIS:  A. LUNG, RUL, FINE NEEDLE ASPIRATION:  Adenocarcinoma  See comment   B. LUNG, RUL, BRUSHING:  Rare atypical cells consistent with adenocarcinoma   C. LUNG, RML TARGET 2, BRUSHING:  No malignant cells identified   There is sufficient cellularity in the cellblock for part A for  molecular testing.  15 mm part solid nodule in the posterior right upper lobe shows minimal FDG activity, similar to prior PET in 2014. This nodule does show progression in size and attenuation since prior earlier studies in 2024, suspicious for low-grade adenocarcinoma.   8 mm nodular density in the superior right middle lobe shows mild FDG uptake, but remains stable in both size and FDG activity compared to previous exams. Low-grade adenocarcinoma cannot be excluded.   No  evidence of metastatic disease.   Bilateral nephrolithiasis, with mild right hydronephrosis due to 11 mm calculus in the right renal pelvis.   Colonic diverticulosis, without radiographic evidence of diverticulitis.  Micro 12/07/2023 2,000 COLONIES/mL STAPHYLOCOCCUS AUREUS  5,000 COLONIES/mL STREPTOCOCCUS MITIS/ORALIS  SUSCEPTIBILITIES   BAL 12/07/2023 RARE STAPHYLOCOCCUS AUREUS RARE STREPTOCOCCUS MITIS/ORALIS NO ANAEROBES ISOLATED Performed at [facility], [address], [place], [place] [address]   Report Status 12/12/2023 FINAL  Organism ID, Bacteria STAPHYLOCOCCUS AUREUS  Organism ID, Bacteria STREPTOCOCCUS MITIS/ORALIS  AFB Negative AFB Negative   LDCT 10/14/2023  Part solid nodule in the posterior segment right upper lobe measures 15.6 mm with a 4.9 mm solid component, grossly stable. 4.1 mm right lower lobe nodule, unchanged. Scarring in the lateral segment right middle lobe (4/148). No pleural fluid. Airway is unremarkable. Part solid nodule in the posterior segment right upper lobe is grossly stable. Lung-RADS 3, probably benign findings. Short-term follow-up in 6 months is recommended with repeat low-dose chest CT without contrast    Latest Ref Rng & Units 12/07/2023    8:38 AM 06/23/2023   12:09 PM 12/17/2022    2:48 PM  CBC  WBC 4.0 - 10.5 K/uL 10.3  10.1  11.0   Hemoglobin 12.0 - 15.0 g/dL 84.9  84.9  84.1   Hematocrit 36.0 - 46.0 % 46.0  47.5  49.2   Platelets 150 - 400 K/uL 368  408  401        Latest Ref Rng & Units 12/07/2023    8:38 AM 06/23/2023   12:09 PM 12/17/2022    2:48 PM  BMP  Glucose 70 - 99 mg/dL 895  95  98   BUN 8 - 23 mg/dL 10  10  11    Creatinine 0.44 - 1.00 mg/dL 9.27  9.31  9.26   BUN/Creat Ratio 12 - 28  15  15    Sodium 135 - 145 mmol/L 143  143  142   Potassium 3.5 - 5.1 mmol/L 3.5  4.4  4.7   Chloride 98 - 111 mmol/L 107  105  102   CO2 22 - 32 mmol/L 28  26  26    Calcium  8.9 - 10.3 mg/dL 9.2  9.3  9.9     BNP     Component Value Date/Time   BNP 25.0 03/05/2019 2254    ProBNP No results found for: PROBNP  PFT    Component Value Date/Time   FEV1PRE 1.59 12/05/2019 1353   FEV1POST 1.58 12/05/2019 1353   FVCPRE 2.36 12/05/2019 1353   FVCPOST 2.47 12/05/2019 1353   TLC 6.31 12/05/2019 1353   DLCOUNC 18.45 12/05/2019 1353   PREFEV1FVCRT 67 12/05/2019 1353   PSTFEV1FVCRT 64 12/05/2019 1353    DG Chest Port 1 View Result Date: 12/07/2023 CLINICAL DATA:  [id number] S/P bronchoscopy with biopsy [id number] EXAM: PORTABLE CHEST - 1 VIEW COMPARISON:  March 28, 2021, November 19, 2023 FINDINGS: Fiducial marker in the suprahilar right upper lung zone. No focal airspace consolidation, pleural effusion, or pneumothorax. No cardiomegaly.  Tortuous aorta with aortic atherosclerosis. No acute fracture or destructive lesions. Multilevel thoracic osteophytosis. IMPRESSION: No acute cardiopulmonary abnormality. Electronically Signed   By: Rogelia Myers M.D.   On: 12/07/2023 12:57   DG C-ARM BRONCHOSCOPY Result Date: 12/07/2023 C-ARM BRONCHOSCOPY: Fluoroscopy was utilized by the requesting physician.  No radiographic interpretation.   NM PET Image Initial (PI) Skull Base To Thigh Result Date: 11/20/2023 CLINICAL DATA:  Initial treatment strategy for right lung nodule. EXAM: NUCLEAR MEDICINE PET SKULL BASE TO THIGH TECHNIQUE: 7.4 mCi F-18 FDG was injected intravenously. Full-ring PET imaging was performed from the skull base to thigh after the radiotracer. CT data was obtained and used for attenuation correction and anatomic localization. Fasting blood glucose: 104 mg/dl COMPARISON:  CT on 91/86/7974 and PET-CT on 04/03/2022 FINDINGS: Mediastinal blood-pool activity (background): SUV max = 2.8 Liver activity (reference): SUV max = N/A NECK:  No hypermetabolic lymph nodes or masses. Incidental CT findings:  None. CHEST: 15 mm part solid nodule is again seen in the posterior right upper lobe, which is stable since recent  CT but does show progression in size and attenuation since prior PET-CT in 2024. This again shows minimal FDG activity, with SUV max of 1.0. 8 mm irregular nodular density in the superior right middle lobe on image 29/203 also remains stable. This shows mild FDG uptake with SUV max of 1.1, also without significant change compared to prior PET-CT in 2024. No hypermetabolic lymph nodes within the thorax. Incidental CT findings:  None. ABDOMEN/PELVIS: No abnormal hypermetabolic activity within the liver, pancreas, adrenal glands, or spleen. No hypermetabolic lymph nodes in the abdomen or pelvis. Incidental CT findings: Multiple small renal calculi are seen bilaterally. Mild right hydronephrosis is seen with 11 mm calculus in the right renal pelvis. Diverticulosis is seen mainly involving the sigmoid colon, however there is no evidence of diverticulitis. Prior hysterectomy also noted. SKELETON: No focal hypermetabolic bone lesions to suggest skeletal metastasis. Incidental CT findings:  None. IMPRESSION: 15 mm part solid nodule in the posterior right upper lobe shows minimal FDG activity, similar to prior PET in 2014. This nodule does show progression in size and attenuation since prior earlier studies in 2024, suspicious for low-grade adenocarcinoma. 8 mm nodular density in the superior right middle lobe shows mild FDG uptake, but remains stable in both size and FDG activity compared to previous exams. Low-grade adenocarcinoma cannot be excluded. No evidence of metastatic disease. Bilateral nephrolithiasis, with mild right hydronephrosis due to 11 mm calculus in the right renal pelvis. Colonic diverticulosis, without radiographic evidence of diverticulitis. Electronically Signed   By: Norleen DELENA Kil M.D.   On: 11/20/2023 12:42     Past medical hx Past Medical History:  Diagnosis Date   Allergy    Arthritis    Atypical mole 06/05/2003   slight-mod-Left scapula (WS)   Atypical nevus 07/26/2003   persist. dysp-  Left scapula (WS)   Basal cell carcinoma 06/05/2003   RIght chest (CX35FU)   Basal cell carcinoma 05/22/2004   superificial- RIght upper back- (CX35FU)   Basal cell carcinoma 11/27/2004   beside R nostril-(MOHS), below right outer nose (MOHS)   Basal cell carcinoma 11/08/2009   left post shoulder -(txpbx)   Basal cell carcinoma 05/24/2012   ulcerated- Left post shoulder- (CX35FU), ulcerated-mid forehead (EXC )   Basal cell carcinoma 06/24/2012   superificial- Right shoulder - (txpbx)    Basal cell carcinoma 07/29/2012    sclerosis- mid forehead (MOHS)   Basal cell carcinoma 06/06/2019  nod-right anterior neck (CX35FU)   Cataract    Closed compression fracture of L5 vertebra (HCC)    Constipation    uses OTC meds with help   COPD (chronic obstructive pulmonary disease) (HCC)    Coronary artery disease    minimal nonobstructive    COVID    Depression    GERD (gastroesophageal reflux disease)    Heart murmur    Moderate AS 12/11/2022 TTE   History of kidney stones    Hx of adenomatous polyp of colon 10/03/2014   Hypercholesteremia    Hypertension    Panic attacks    Parkinson disease (HCC)    Perimenopausal    Skin cancer    Squamous cell carcinoma of skin 06/05/2003   Right Temple(CX35FU)   Squamous cell carcinoma of skin 05/22/2004   in situ- Left upperarm (CX35FU)   Squamous cell carcinoma of skin 11/27/2004   in situ- above Left elbow (CX35FU)   Squamous cell carcinoma of skin 03/04/2006   in situ- Left sideburn (CX35FU)   Squamous cell carcinoma of skin 11/26/2006   in situ- mid brow (Cx35FU)   Squamous cell carcinoma of skin 05/24/2012   in situ- Left chest- (CX35FU)   Squamous cell carcinoma of skin 04/05/2013   in situ- Left nose (Cx35FU)   Squamous cell carcinoma of skin 08/17/2013   well diff-above Left eye (txpbx)   Squamous cell carcinoma of skin 07/30/2016   in situ- right chest sup (Cx35FU)   Squamous cell carcinoma of skin 05/12/2017   in situ-  RIght shoulder (CX35FU), in situ- Left forehead (CX35FU)   Squamous cell carcinoma of skin 11/23/2018   in situ- Right mid shin, inner- (MOHS)   Tobacco user      Social History   Tobacco Use   Smoking status: Every Day    Current packs/day: 1.00    Average packs/day: 1 pack/day for 54.6 years (54.6 ttl pk-yrs)    Types: Cigarettes    Start date: 05/01/1969   Smokeless tobacco: Never   Tobacco comments:    1 pack a day 12/14/2023 KRD  Vaping Use   Vaping status: Never Used  Substance Use Topics   Alcohol use: No    Alcohol/week: 0.0 standard drinks of alcohol   Drug use: No    Comment: per pt, Cocaine was in her system October and November 2016 when she went to the pain clinic 04-02-15.    Ms.Champoux reports that she has been smoking cigarettes. She started smoking about 54 years ago. She has a 54.6 pack-year smoking history. She has never used smokeless tobacco. She reports that she does not drink alcohol and does not use drugs.  Tobacco Cessation: Ready to quit: Not Answered Counseling given: Not Answered Tobacco comments: 1 pack a day 12/14/2023 KRD Current everyday smoker. Patient has been counseled for 3 minutes on smoking cessation, please see after visit summary for resources provided.  Past surgical hx, Family hx, Social hx all reviewed.  Current Outpatient Medications on File Prior to Visit  Medication Sig   acetaminophen  (TYLENOL ) 500 MG tablet Take 1,000 mg by mouth every 6 (six) hours as needed for moderate pain.   albuterol  (VENTOLIN  HFA) 108 (90 Base) MCG/ACT inhaler Inhale 1-2 puffs into the lungs every 6 (six) hours as needed for wheezing or shortness of breath.   alendronate  (FOSAMAX ) 70 MG tablet TAKE 1 TABLET BY MOUTH EVERY 7 DAYS with a full GLASS of water  ON an EMPTY stomach. DO not lay down  FOR AT least 2 HOURS   amLODipine  (NORVASC ) 10 MG tablet Take 1 tablet (10 mg total) by mouth daily.   atorvastatin  (LIPITOR) 80 MG tablet Take 1 tablet (80 mg total)  by mouth daily.   carbidopa -levodopa  (SINEMET  IR) 10-100 MG tablet TAKE 1 TABLET BY MOUTH THREE TIMES DAILY FOR parkinsons   clonazePAM  (KLONOPIN ) 1 MG tablet Take 1 tablet (1 mg total) by mouth 3 (three) times daily as needed. for anxiety   dexlansoprazole  (DEXILANT ) 60 MG capsule TAKE ONE CAPSULE BY MOUTH DAILY ON AN EMPTY STOMACH   DULoxetine  (CYMBALTA ) 60 MG capsule Take 1 capsule (60 mg total) by mouth 2 (two) times daily.   ezetimibe  (ZETIA ) 10 MG tablet Take 1 tablet (10 mg total) by mouth daily.   gabapentin (NEURONTIN) 600 MG tablet Take 600 mg by mouth 3 (three) times daily.   ipratropium-albuterol  (DUONEB) 0.5-2.5 (3) MG/3ML SOLN Take 3 mLs by nebulization every 6 (six) hours as needed.   levocetirizine (XYZAL ) 5 MG tablet Take 1 tablet (5 mg total) by mouth every evening.   LINZESS  145 MCG CAPS capsule TAKE ONE CAPSULE BY MOUTH DAILY TO regulate bowel movement   lisinopril  (ZESTRIL ) 40 MG tablet Take 1 tablet (40 mg total) by mouth daily.   meclizine  (ANTIVERT ) 25 MG tablet TAKE 1 TABLET BY MOUTH THREE TIMES DAILY AS NEEDED FOR dizziness.   meloxicam (MOBIC) 15 MG tablet Take 15 mg by mouth daily.   methocarbamol  (ROBAXIN ) 750 MG tablet Take 750 mg by mouth every 6 (six) hours as needed.   naproxen sodium (ALEVE) 220 MG tablet Take 220 mg by mouth 2 (two) times daily as needed (pain).   rOPINIRole  (REQUIP ) 3 MG tablet TAKE 1 TABLET BY MOUTH AT BEDTIME (needs TO be seen BEFORE NEXT refill)   SYMBICORT  160-4.5 MCG/ACT inhaler INHALE 2 puffs into THE lungs TWICE DAILY   Tiotropium Bromide  Monohydrate (SPIRIVA  RESPIMAT) 2.5 MCG/ACT AERS INHALE 2 puffs BY MOUTH DAILY   Vitamin D , Ergocalciferol , (DRISDOL ) 1.25 MG (50000 UNIT) CAPS capsule TAKE 1 CAPSULE BY MOUTH WEEKLY   No current facility-administered medications on file prior to visit.     Allergies  Allergen Reactions   Fentanyl  Other (See Comments)   Lorcet 10-650 [Hydrocodone -Acetaminophen ] Itching   Sulfa  Antibiotics Other  (See Comments)    aching   Tape Other (See Comments)    Tear skin. Please use paper tape   Tylenol  [Acetaminophen ] Itching   Wellbutrin  Xl [Bupropion  Hcl Er (Xl)]     Upset stomach /bad taste in mouth    Review Of Systems:  Constitutional:   No  weight loss, night sweats,  Fevers, chills, fatigue, or  lassitude.  HEENT:   No headaches,  Difficulty swallowing,  Tooth/dental problems, or  Sore throat,                No sneezing, itching, ear ache, nasal congestion, post nasal drip,   CV:  No chest pain,  Orthopnea, PND, swelling in lower extremities, anasarca, dizziness, palpitations, syncope.   GI  No heartburn, indigestion, abdominal pain, nausea, vomiting, diarrhea, change in bowel habits, loss of appetite, bloody stools.   Resp: No shortness of breath with exertion or at rest.  No excess mucus, no productive cough,  No non-productive cough,  No coughing up of blood.  No change in color of mucus.  No wheezing.  No chest wall deformity  Skin: no rash or lesions.  GU: no dysuria, change in color of urine, no urgency  or frequency.  No flank pain, no hematuria   MS:  No joint pain or swelling.  No decreased range of motion.  No back pain.  Psych:  No change in mood or affect. No depression or anxiety.  No memory loss.   Vital Signs BP 126/83   Pulse 83   Temp 98.2 F (36.8 C) (Oral)   Ht 5' 5 (1.651 m)   Wt 139 lb (63 kg)   SpO2 96%   BMI 23.13 kg/m    Physical Exam:  General- No distress,  A&Ox3, pleasant and appropriate ENT: No sinus tenderness, TM clear, pale nasal mucosa, no oral exudate,no post nasal drip, no LAN Cardiac: S1, S2, regular rate and rhythm, no murmur Chest: No wheeze/ rales/ dullness; no accessory muscle use, no nasal flaring, no sternal retractions, positive rhonchi that clears with cough, diminished per bases. Abd.: Soft Non-tender, nondistended, bowel sounds +, Body mass index is 23.13 kg/m.  Ext: No clubbing cyanosis, edema, no obvious  deformities Neuro:  normal strength, all extremities x 4, alert and oriented x 3, appropriate Skin: No rashes, warm and dry, no obvious skin lesions Psych: normal mood and behavior   Assessment & Plan New diagnosis adenocarcinoma of the right upper lobe of the lung. Current everyday smoker Post Bronchoscopy with biopsies Plan We discussed that your biopsy showed adenocarcinoma of the right upper lobe of your lung. I have ordered an MRI of the brain to complete staging of your cancer. Your brain is the only organ we have not checked.  I have also ordered Pulmonary function tests. This is you make sure you are a candidate for surgery. I have made referrals to radiation oncology, medical oncology and surgery for consults to determine best options for treatment. The cancer center  will take great care of you. Metta Sours with treatment. I have also treated you with Cipro , and antibiotic for some of the bacteria in your lower respiratory system. Please take probiotic with antibiotic. You can use Activia Yogurt x 20 days , or a probiotic tablet. Follow up with Dr. Theophilus in 3 months to ensure you are doing well.  Call if you need us . Please contact office for sooner follow up if symptoms do not improve or worsen or seek emergency care  Please work on quitting smoking.   You can receive free nicotine  replacement therapy (patches, gum, or mints) by calling 1-800-QUIT NOW. Please call so we can get you on the path to becoming a non-smoker. I know it is hard, but you can do this!  Hypnosis for smoking cessation  [employer] [phone]  Acupuncture for smoking cessation  [employer] [phone]     I spent 40 minutes dedicated to the care of this patient on the date of this encounter to include pre-visit review of records, face-to-face time with the patient discussing conditions above, post visit ordering of testing, clinical documentation with the electronic health  record, making appropriate referrals as documented, and communicating necessary information to the patient's healthcare team.      Lauraine JULIANNA Lites, NP 12/14/2023  10:20 AM

## 2023-12-14 NOTE — Patient Instructions (Addendum)
 It is good to see you today. I am glad you have done well since the biopsy. We discussed that your biopsy showed adenocarcinoma of the right upper lobe of your lung. I have ordered an MRI of the brain to complete staging of your cancer. Your brain is the only organ we have not checked.  I have also ordered Pulmonary function tests. This is you make sure you are a candidate for surgery. I have made referrals to radiation oncology, medical oncology and surgery for consults to determine best options for treatment. The cancer center  will take great care of you. Metta Sours with treatment. I have also treated you with Cipro , and antibiotic for some of the bacteria in your lower respiratory system. Please take probiotic with antibiotic. You can use Activia Yogurt x 20 days , or a probiotic tablet. Follow up with Dr. Theophilus in 3 months to ensure you are doing well.  Call if you need us . Please contact office for sooner follow up if symptoms do not improve or worsen or seek emergency care  Please work on quitting smoking.   You can receive free nicotine  replacement therapy (patches, gum, or mints) by calling 1-800-QUIT NOW. Please call so we can get you on the path to becoming a non-smoker. I know it is hard, but you can do this!  Hypnosis for smoking cessation  [employer] [phone]  Acupuncture for smoking cessation  [employer] [phone]

## 2023-12-16 ENCOUNTER — Telehealth: Payer: Self-pay

## 2023-12-16 NOTE — Telephone Encounter (Signed)
 Thoracic Tumor Board Review  Eilidh Xiang's chart was reviewed for the Thoracic Tumor Boards on 12/16/2023. Based on her family history of colon cancer she is a candidate for genetic testing.  Binnie Droessler meets [employer] (NCCN) criteria for genetic testing based on her family history of colon cancer diagnosed in her brother at age 70. Additionally, she has a personal and strong family history of colon polyps reported in her siblings and childent.  Santana Fryer, MS, CGC  Certified Genetic Counselor  Email: [email or web address]  Phone: [phone]

## 2023-12-17 ENCOUNTER — Other Ambulatory Visit: Payer: Self-pay

## 2023-12-17 NOTE — Progress Notes (Signed)
 The proposed treatment discussed in conference is for discussion purpose only and is not a binding recommendation.  The patients have not been physically examined, or presented with their treatment options.  Therefore, final treatment plans cannot be decided.

## 2023-12-18 ENCOUNTER — Telehealth: Payer: Self-pay | Admitting: Radiation Oncology

## 2023-12-18 NOTE — Telephone Encounter (Addendum)
 10/17 Called and spoke to patient about being sch for consult.  She will call back to confirm after getting transportation setup. Patient call to confirm/will be on available 10/24.

## 2023-12-21 ENCOUNTER — Inpatient Hospital Stay

## 2023-12-21 ENCOUNTER — Inpatient Hospital Stay: Attending: Oncology | Admitting: Oncology

## 2023-12-21 VITALS — BP 111/59 | HR 91 | Temp 97.7°F | Resp 20 | Ht 65.0 in | Wt 139.8 lb

## 2023-12-21 DIAGNOSIS — Z801 Family history of malignant neoplasm of trachea, bronchus and lung: Secondary | ICD-10-CM | POA: Insufficient documentation

## 2023-12-21 DIAGNOSIS — Z85828 Personal history of other malignant neoplasm of skin: Secondary | ICD-10-CM | POA: Diagnosis not present

## 2023-12-21 DIAGNOSIS — C3411 Malignant neoplasm of upper lobe, right bronchus or lung: Secondary | ICD-10-CM | POA: Diagnosis not present

## 2023-12-21 DIAGNOSIS — F17210 Nicotine dependence, cigarettes, uncomplicated: Secondary | ICD-10-CM | POA: Insufficient documentation

## 2023-12-21 DIAGNOSIS — Z800 Family history of malignant neoplasm of digestive organs: Secondary | ICD-10-CM | POA: Insufficient documentation

## 2023-12-21 DIAGNOSIS — C3491 Malignant neoplasm of unspecified part of right bronchus or lung: Secondary | ICD-10-CM

## 2023-12-21 DIAGNOSIS — I10 Essential (primary) hypertension: Secondary | ICD-10-CM | POA: Diagnosis not present

## 2023-12-21 DIAGNOSIS — C3490 Malignant neoplasm of unspecified part of unspecified bronchus or lung: Secondary | ICD-10-CM | POA: Insufficient documentation

## 2023-12-21 NOTE — Progress Notes (Addendum)
 Hematology-Oncology Clinic Note  Zollie Lowers, MD   Reason for Referral: Non-small cell lung carcinoma  History  Oncology History: I have reviewed her chart and materials related to her cancer extensively and collaborated history with the patient. Summary of oncologic history is as follows:  Diagnosis: stage I non small cell lung cancer  -03/24/2022: CT Chest Lung CA screen low dose without CM: Ground-glass nodule of the right upper lobe has doubled in size when compared with prior lung cancer screening CT and is suspicious for primary lung adenocarcinoma. Lung-RADS 4X, highly suspicious.  -04/03/2022:PET scan: Ground-glass 1.0 cm posterior right upper lobe pulmonary nodule demonstrates no significant FDG uptake. No hypermetabolic thoracic adenopathy or distant metastatic disease. Left adrenal adenoma, for which no follow-up imaging is recommended.  -10/03/2022: CT Chest low dose without contrast: Lung-RADS 2, benign appearance or behavior. Continue annual screening with low-dose chest CT without contrast in 12 months. Left adrenal adenoma. Tiny left renal stone.  -07/13/2023: CT Chest low dose without contrast: Lung-RADS 4A, suspicious. Continued progression of mixed density right upper lobe pulmonary nodule.  -10/14/2023: CT Chest low dose without contrast: Part solid nodule in the posterior segment right upper lobe is grossly stable. Lung-RADS 3, probably benign findings.  -11/20/2023: PET scan: 15 mm part solid nodule in the posterior right upper lobe shows minimal FDG activity, similar to prior PET in 2022/12/31. This nodule does show progression in size and attenuation since prior earlier studies in 31-Dec-2022, suspicious for low-grade adenocarcinoma. 8 mm nodular density in the superior right middle lobe shows mild FDG uptake, but remains stable in both size and FDG activity compared to previous exams. Low-grade adenocarcinoma cannot be excluded. No evidence of metastatic disease. Bilateral  nephrolithiasis, with mild right hydronephrosis due to 11 mm calculus in the right renal pelvis. Colonic diverticulosis, without radiographic evidence of diverticulitis. -12/07/2023: EBUS with biopsy:  -Right upper lobe FNA: Adenocarcinoma - Right middle lobe brushing: No malignant cells identified   History of Presenting Illness: [name] 68 y.o. female is referred by Lauraine Lites, PA for non small cell lung cancer.  [name] experiences coughing fits, but does not cough up blood. She notes change in appetite, eating less daily.  She has no other complaints and has been doing well.  Her husband died from lung cancer in 12/30/17.  We discussed her PET scan findings and her pathology results.  Patient is likely a stage I lung adenocarcinoma pending MRI of brain and discussed that treatment options could include surgery or SBRT.  Patient has an upcoming appointment with them both thoracic surgery and radiation oncology  [name] is an active smoker and is trying to lower her intake as a means to quit. She now smokes about a pack a day instead of two.   [name] lives alone in [place], [place]. Her sister died of throat cancer, and she was also a smoker. Her brother died years ago of colon cancer.  Medical History: Past Medical History:  Diagnosis Date   Allergy    Arthritis    Atypical mole 06/05/2003   slight-mod-Left scapula (WS)   Atypical nevus 07/26/2003   persist. dysp- Left scapula (WS)   Basal cell carcinoma 06/05/2003   RIght chest (CX35FU)   Basal cell carcinoma 05/22/2004   superificial- RIght upper back- (CX35FU)   Basal cell carcinoma 11/27/2004   beside R nostril-(MOHS), below right outer nose (MOHS)   Basal cell carcinoma 11/08/2009   left post shoulder -(txpbx)   Basal cell carcinoma  05/24/2012   ulcerated- Left post shoulder- (CX35FU), ulcerated-mid forehead (EXC )   Basal cell carcinoma 06/24/2012   superificial- Right shoulder - (txpbx)    Basal cell carcinoma 07/29/2012     sclerosis- mid forehead (MOHS)   Basal cell carcinoma 06/06/2019   nod-right anterior neck (CX35FU)   Cataract    Closed compression fracture of L5 vertebra (HCC)    Constipation    uses OTC meds with help   COPD (chronic obstructive pulmonary disease) (HCC)    Coronary artery disease    minimal nonobstructive    COVID    Depression    GERD (gastroesophageal reflux disease)    Heart murmur    Moderate AS 12/11/2022 TTE   History of kidney stones    Hx of adenomatous polyp of colon 10/03/2014   Hypercholesteremia    Hypertension    Panic attacks    Parkinson disease (HCC)    Perimenopausal    Skin cancer    Squamous cell carcinoma of skin 06/05/2003   Right Temple(CX35FU)   Squamous cell carcinoma of skin 05/22/2004   in situ- Left upperarm (CX35FU)   Squamous cell carcinoma of skin 11/27/2004   in situ- above Left elbow (CX35FU)   Squamous cell carcinoma of skin 03/04/2006   in situ- Left sideburn (CX35FU)   Squamous cell carcinoma of skin 11/26/2006   in situ- mid brow (Cx35FU)   Squamous cell carcinoma of skin 05/24/2012   in situ- Left chest- (CX35FU)   Squamous cell carcinoma of skin 04/05/2013   in situ- Left nose (Cx35FU)   Squamous cell carcinoma of skin 08/17/2013   well diff-above Left eye (txpbx)   Squamous cell carcinoma of skin 07/30/2016   in situ- right chest sup (Cx35FU)   Squamous cell carcinoma of skin 05/12/2017   in situ- RIght shoulder (CX35FU), in situ- Left forehead (CX35FU)   Squamous cell carcinoma of skin 11/23/2018   in situ- Right mid shin, inner- (MOHS)   Tobacco user     Surgical history: Past Surgical History:  Procedure Laterality Date   ABDOMINAL HYSTERECTOMY     BACK SURGERY     Spinal Fusion   CATARACT EXTRACTION W/PHACO Right 03/01/2021   Procedure: CATARACT EXTRACTION PHACO AND INTRAOCULAR LENS PLACEMENT (IOC);  Surgeon: Harrie Agent, MD;  Location: [facility];  Service: Ophthalmology;  Laterality: Right;  CDE: 7.31   CATARACT  EXTRACTION W/PHACO Left 05/16/2021   Procedure: CATARACT EXTRACTION PHACO AND INTRAOCULAR LENS PLACEMENT (IOC);  Surgeon: Harrie Agent, MD;  Location: [facility];  Service: Ophthalmology;  Laterality: Left;  CDE 9.41    COLONOSCOPY     IR VERTEBROPLASTY CERV/THOR BX INC UNI/BIL INC/INJECT/IMAGING  04/16/2021   Right foot toe surgery     UPPER GASTROINTESTINAL ENDOSCOPY     VIDEO BRONCHOSCOPY WITH ENDOBRONCHIAL NAVIGATION Right 12/07/2023   Procedure: VIDEO BRONCHOSCOPY WITH ENDOBRONCHIAL NAVIGATION;  Surgeon: Shelah Lamar RAMAN, MD;  Location: [facility];  Service: Pulmonary;  Laterality: Right;   vocal cord     polyp removal     Allergies:  is allergic to fentanyl , lorcet 10-650 [hydrocodone -acetaminophen ], sulfa  antibiotics, tape, tylenol  [acetaminophen ], and wellbutrin  xl [bupropion  hcl er (xl)].  Medications:  Current Outpatient Medications  Medication Sig Dispense Refill   acetaminophen  (TYLENOL ) 500 MG tablet Take 1,000 mg by mouth every 6 (six) hours as needed for moderate pain.     albuterol  (VENTOLIN  HFA) 108 (90 Base) MCG/ACT inhaler Inhale 1-2 puffs into the lungs every 6 (six) hours as needed  for wheezing or shortness of breath. 8 g 5   alendronate  (FOSAMAX ) 70 MG tablet TAKE 1 TABLET BY MOUTH EVERY 7 DAYS with a full GLASS of water  ON an EMPTY stomach. DO not lay down FOR AT least 2 HOURS 12 tablet 0   amLODipine  (NORVASC ) 10 MG tablet Take 1 tablet (10 mg total) by mouth daily. 90 tablet 3   atorvastatin  (LIPITOR) 80 MG tablet Take 1 tablet (80 mg total) by mouth daily. 90 tablet 3   carbidopa -levodopa  (SINEMET  IR) 10-100 MG tablet TAKE 1 TABLET BY MOUTH THREE TIMES DAILY FOR parkinsons 90 tablet 1   ciprofloxacin  (CIPRO ) 500 MG tablet Take 1 tablet (500 mg total) by mouth 2 (two) times daily. Take with probiotic, call if you develop diarrhea. 20 tablet 0   clonazePAM  (KLONOPIN ) 1 MG tablet Take 1 tablet (1 mg total) by mouth 3 (three) times daily as needed. for anxiety 90 tablet  2   dexlansoprazole  (DEXILANT ) 60 MG capsule TAKE ONE CAPSULE BY MOUTH DAILY ON AN EMPTY STOMACH 30 capsule 5   DULoxetine  (CYMBALTA ) 60 MG capsule Take 1 capsule (60 mg total) by mouth 2 (two) times daily. 180 capsule 3   ezetimibe  (ZETIA ) 10 MG tablet Take 1 tablet (10 mg total) by mouth daily. 90 tablet 3   gabapentin (NEURONTIN) 600 MG tablet Take 600 mg by mouth 3 (three) times daily.     ipratropium-albuterol  (DUONEB) 0.5-2.5 (3) MG/3ML SOLN Take 3 mLs by nebulization every 6 (six) hours as needed. 360 mL 5   levocetirizine (XYZAL ) 5 MG tablet Take 1 tablet (5 mg total) by mouth every evening. 90 tablet 3   LINZESS  145 MCG CAPS capsule TAKE ONE CAPSULE BY MOUTH DAILY TO regulate bowel movement 30 capsule 5   lisinopril  (ZESTRIL ) 40 MG tablet Take 1 tablet (40 mg total) by mouth daily. 90 tablet 3   meclizine  (ANTIVERT ) 25 MG tablet TAKE 1 TABLET BY MOUTH THREE TIMES DAILY AS NEEDED FOR dizziness. 60 tablet 2   meloxicam (MOBIC) 15 MG tablet Take 15 mg by mouth daily.     methocarbamol  (ROBAXIN ) 750 MG tablet Take 750 mg by mouth every 6 (six) hours as needed.     naproxen sodium (ALEVE) 220 MG tablet Take 220 mg by mouth 2 (two) times daily as needed (pain).     rOPINIRole  (REQUIP ) 3 MG tablet TAKE 1 TABLET BY MOUTH AT BEDTIME 90 tablet 0   SYMBICORT  160-4.5 MCG/ACT inhaler INHALE 2 puffs into THE lungs TWICE DAILY 122.4 g 0   Tiotropium Bromide  Monohydrate (SPIRIVA  RESPIMAT) 2.5 MCG/ACT AERS INHALE 2 puffs BY MOUTH DAILY 24 g 2   Vitamin D , Ergocalciferol , (DRISDOL ) 1.25 MG (50000 UNIT) CAPS capsule TAKE 1 CAPSULE BY MOUTH WEEKLY 13 capsule 1   No current facility-administered medications for this visit.    Review of Systems: Constitutional: Denies fevers, chills or abnormal night sweats Eyes: Denies blurriness of vision, double vision or watery eyes Ears, nose, mouth, throat, and face: Denies mucositis or sore throat Respiratory: Denies cough, dyspnea or wheezes Cardiovascular:  Denies palpitation, chest discomfort or lower extremity swelling Gastrointestinal:  Denies nausea, heartburn or change in bowel habits Skin: Denies abnormal skin rashes Lymphatics: Denies new lymphadenopathy or easy bruising Neurological:Denies numbness, tingling or new weaknesses Behavioral/Psych: Mood is stable, no new changes  All other systems were reviewed with the patient and are negative.  Physical Examination: ECOG PERFORMANCE STATUS: 1 - Symptomatic but completely ambulatory  Vitals:   12/21/23 1100  BP: (!) 111/59  Pulse: 91  Resp: 20  Temp: 97.7 F (36.5 C)  SpO2: 97%   Filed Weights   12/21/23 1100  Weight: 139 lb 12.4 oz (63.4 kg)    GENERAL:alert, no distress and comfortable SKIN: skin color, texture, turgor are normal, no rashes or significant lesions LYMPH:  no palpable lymphadenopathy in the cervical, axillary or inguinal LUNGS: clear to auscultation and percussion with normal breathing effort HEART: regular rate & rhythm and no murmurs and no lower extremity edema ABDOMEN:abdomen soft, non-tender and normal bowel sounds Musculoskeletal:no cyanosis of digits and no clubbing  PSYCH: alert & oriented x 3 with fluent speech NEURO: no focal motor/sensory deficits   Laboratory Data: I have reviewed the data as listed Lab Results  Component Value Date   WBC 10.3 12/07/2023   HGB 15.0 12/07/2023   HCT 46.0 12/07/2023   MCV 95.8 12/07/2023   PLT 368 12/07/2023   Recent Labs    06/23/23 1209 12/07/23 0838  NA 143 143  K 4.4 3.5  CL 105 107  CO2 26 28  GLUCOSE 95 104*  BUN 10 10  CREATININE 0.68 0.72  CALCIUM  9.3 9.2  GFRNONAA  --  >60  PROT 6.4  --   ALBUMIN 3.9  --   AST 12  --   ALT 13  --   ALKPHOS 91  --   BILITOT 0.2  --     Radiographic Studies: I have personally reviewed the radiological images as listed and agreed with the findings in the report. CLINICAL DATA:  Initial treatment strategy for right lung nodule.   EXAM: NUCLEAR  MEDICINE PET SKULL BASE TO THIGH   TECHNIQUE: 7.4 mCi F-18 FDG was injected intravenously. Full-ring PET imaging was performed from the skull base to thigh after the radiotracer. CT data was obtained and used for attenuation correction and anatomic localization.   Fasting blood glucose: 104 mg/dl   COMPARISON:  CT on 91/86/7974 and PET-CT on 04/03/2022   FINDINGS: Mediastinal blood-pool activity (background): SUV max = 2.8   Liver activity (reference): SUV max = N/A   NECK:  No hypermetabolic lymph nodes or masses.   Incidental CT findings:  None.   CHEST: 15 mm part solid nodule is again seen in the posterior right upper lobe, which is stable since recent CT but does show progression in size and attenuation since prior PET-CT in 2024. This again shows minimal FDG activity, with SUV max of 1.0.   8 mm irregular nodular density in the superior right middle lobe on image 29/203 also remains stable. This shows mild FDG uptake with SUV max of 1.1, also without significant change compared to prior PET-CT in 2024.   No hypermetabolic lymph nodes within the thorax.   Incidental CT findings:  None.   ABDOMEN/PELVIS: No abnormal hypermetabolic activity within the liver, pancreas, adrenal glands, or spleen. No hypermetabolic lymph nodes in the abdomen or pelvis.   Incidental CT findings: Multiple small renal calculi are seen bilaterally. Mild right hydronephrosis is seen with 11 mm calculus in the right renal pelvis. Diverticulosis is seen mainly involving the sigmoid colon, however there is no evidence of diverticulitis. Prior hysterectomy also noted.   SKELETON: No focal hypermetabolic bone lesions to suggest skeletal metastasis.   Incidental CT findings:  None.   IMPRESSION: 15 mm part solid nodule in the posterior right upper lobe shows minimal FDG activity, similar to prior PET in 2014. This nodule does show progression in size and  attenuation since prior earlier  studies in 2024, suspicious for low-grade adenocarcinoma.   8 mm nodular density in the superior right middle lobe shows mild FDG uptake, but remains stable in both size and FDG activity compared to previous exams. Low-grade adenocarcinoma cannot be excluded.   No evidence of metastatic disease.   Bilateral nephrolithiasis, with mild right hydronephrosis due to 11 mm calculus in the right renal pelvis.   Colonic diverticulosis, without radiographic evidence of diverticulitis.     Electronically Signed   By: Norleen DELENA Kil M.D.   On: 11/20/2023 12:42   ASSESSMENT & PLAN:  Patient is a 68 y.o. female presenting for non small cell lung cancer   Non-small cell lung cancer Patient had initial lung cancer screening in 2024 consistent with and lung nodule.  Recent lung cancer screening CT scan showed an increase in size which was followed by PET scan and EBUS with biopsy consistent with adenocarcinoma.  - Patient likely has a stage I non-small cell lung cancer of right lung pending MRI of brain -We reviewed the PET scan findings together.  She does have low SUV nodule in her right lung. - She is scheduled for an MRI of brain tomorrow - Patient is scheduled to see radiation oncology on 12/25/2023 and thoracic surgery on 01/08/2024 - She also has new PFTs and echocardiogram pending before seeing thoracic surgery -Discussed with the patient the diagnosis and prognosis at this time.  If patient's MRI brain is negative, she does have stage I non-small cell lung cancer for which standard of care is either lobectomy or SBRT.  She will not need chemotherapy at this time. - Patient will need frequent monitoring after the standard of care.  Return to clinic after meeting with radiation oncologist and thoracic surgery to discuss further management.  Anxiety, nervousness Patient is anxious about the diagnosis but has no mood abnormalities at this time.  - Will refer to our social worker for any  social needs.  Orders Placed This Encounter  Procedures   Ambulatory referral to Social Work    Referral Priority:   Routine    Referral Type:   Consultation    Referral Reason:   Specialty Services Required    Number of Visits Requested:   1   Ambulatory referral to Social Work    Referral Priority:   Routine    Referral Type:   Consultation    Referral Reason:   Specialty Services Required    Number of Visits Requested:   1    The total time spent in the appointment was 60 minutes encounter with patients including review of chart and various tests results, discussions about plan of care and coordination of care plan   All questions were answered. The patient knows to call the clinic with any problems, questions or concerns. No barriers to learning was detected.  I, Marijo Sharps, acting as a Neurosurgeon for [employer], MD.,have documented all relevant documentation on the behalf of Mickiel Dry, MD,as directed by  Mickiel Dry, MD while in the presence of Mickiel Dry, MD.  I, Mickiel Dry MD, have reviewed the above documentation for accuracy and completeness, and I agree with the above.    Mickiel Dry, MD 10/20/20253:45 PM

## 2023-12-21 NOTE — Patient Instructions (Signed)
 [facility]  Discharge Instructions  You were seen and examined today by Dr. Davonna. Dr. Davonna is a medical oncologist, meaning that she specializes in the treatment of cancer diagnoses. Dr. Davonna discussed your past medical history, family history of cancers, and the events that led to you being here today.  You were referred to Dr. Davonna due to a new diagnosis of Stage I Lung Cancer. This is great news because it is curable.  Stage I Lung Cancer is treated either with surgery or targeted radiation. You will see the radiation oncologist and surgeon to see what is the best option for you.  Follow-up as scheduled.  Thank you for choosing [facility] to provide your oncology and hematology care.   To afford each patient quality time with our provider, please arrive at least 15 minutes before your scheduled appointment time. You may need to reschedule your appointment if you arrive late (10 or more minutes). Arriving late affects you and other patients whose appointments are after yours.  Also, if you miss three or more appointments without notifying the office, you may be dismissed from the clinic at the provider's discretion.    Again, thank you for choosing [facility].  Our hope is that these requests will decrease the amount of time that you wait before being seen by our physicians.   If you have a lab appointment with the Cancer Center - please note that after April 8th, all labs will be drawn in the cancer center.  You do not have to check in or register with the main entrance as you have in the past but will complete your check-in at the cancer center.            _____________________________________________________________  Should you have questions after your visit to [facility], please contact our office at [phone] and follow the prompts.  Our office hours are 8:00 a.m. to 4:30 p.m. Monday -  Thursday and 8:00 a.m. to 2:30 p.m. Friday.  Please note that voicemails left after 4:00 p.m. may not be returned until the following business day.  We are closed weekends and all major holidays.  You do have access to a nurse 24-7, just call the main number to the clinic [phone] and do not press any options, hold on the line and a nurse will answer the phone.    For prescription refill requests, have your pharmacy contact our office and allow 72 hours.    Masks are no longer required in the cancer centers. If you would like for your care team to wear a mask while they are taking care of you, please let them know. You may have one support person who is at least 70 years old accompany you for your appointments.

## 2023-12-22 ENCOUNTER — Ambulatory Visit (HOSPITAL_COMMUNITY)
Admission: RE | Admit: 2023-12-22 | Discharge: 2023-12-22 | Disposition: A | Discharge status: Home / Self Care | Source: Ambulatory Visit | Attending: Acute Care | Admitting: Acute Care

## 2023-12-22 DIAGNOSIS — C3490 Malignant neoplasm of unspecified part of unspecified bronchus or lung: Secondary | ICD-10-CM | POA: Insufficient documentation

## 2023-12-22 DIAGNOSIS — I6782 Cerebral ischemia: Secondary | ICD-10-CM | POA: Diagnosis not present

## 2023-12-22 MED ORDER — GADOBUTROL 1 MMOL/ML IV SOLN
6.0000 mL | Freq: Once | INTRAVENOUS | Status: AC | PRN
  Administered 2023-12-22: 6 mL via INTRAVENOUS

## 2023-12-23 ENCOUNTER — Inpatient Hospital Stay: Admitting: Licensed Clinical Social Worker

## 2023-12-23 DIAGNOSIS — C3490 Malignant neoplasm of unspecified part of unspecified bronchus or lung: Secondary | ICD-10-CM

## 2023-12-23 NOTE — Progress Notes (Signed)
 Thoracic Location of Tumor / Histology: ***  Patient presented {numbers 1-12:19994} months ago with symptoms of: ***  Biopsies revealed:    Tobacco/Marijuana/Snuff/ETOH use: {:18581}  Past/Anticipated interventions by pulmonology, if any:  Video Bronchoscopy with Endobrachial Navigation   Past/Anticipated interventions by medical oncology, if any: {:18581}  Signs/Symptoms Weight changes, if any: {:18581} Respiratory complaints, if any: {:18581} Hemoptysis, if any: {:18581} Pain issues, if any:  {:18581}  SAFETY ISSUES: Prior radiation? {:18581} Pacemaker/ICD? {:18581}  Possible current pregnancy?{:18581} Is the patient on methotrexate? {:18581}  Current Complaints / other details:  ***

## 2023-12-24 ENCOUNTER — Encounter: Payer: Self-pay | Admitting: Radiation Oncology

## 2023-12-24 NOTE — Progress Notes (Signed)
 Radiation Oncology         [phone] ________________________________  Initial Outpatient Consultation  Name: [name] MRN: [id number]  Date: 12/25/2023  DOB: [birth year]  RR:Dujrxd, Butler, MD  Shelah Lamar RAMAN, MD   REFERRING PHYSICIAN: Shelah Lamar RAMAN, MD  DIAGNOSIS: No diagnosis found.   Cancer Staging  Non-small cell lung cancer [facility]) Staging form: Lung, AJCC V9 - Clinical stage from 12/21/2023: Stage IA2 (cT1b, cN0, cM0) - Signed by Davonna Siad, MD on 12/21/2023 Stage prefix: Initial diagnosis Method of lymph node assessment: Clinical  Adenocarcinoma of the right upper lung   CHIEF COMPLAINT: Here to discuss management of right lung cancer  HISTORY OF PRESENT ILLNESS::[name] is a 68 y.o. female current smoker with a 55 pack year smoking history. She has participated in the lung cancer screening program for quite some time now at [facility].   She presented for her first lung cancer screening CT on 03/24/22 which demonstrated a two-fold increase in size of a ground glass nodule in the RUL; Lung-RADS 4X, highly suspicious (interval increase is in comparison to her prior lung cancer screening CT from 03/22/20).   A PET scan was accordingly obtained on 04/03/22 to further evaluate this findings which showed no significant FDG uptake associated with the 1.0 cm RUL nodule, and no evidence of hypermetabolic thoracic adenopathy or distant metastatic disease overall.   She continued to present for lung cancer screening CT's and her next CT scan on 10/03/22 showed stability of the RUL nodule and benign findings overall.   However, her next lung cancer screening CT on 07/13/23 showed a significant increase in size of the RUL nodule, measuring 14.8 mm (previously measuring 11.8 mm), and a new associated confluent nodular component measuring approximately 3.9 mm (Lung-RADS 4A, suspicious).   1 month repeat imaging was recommended and she accordingly presented  for a follow-up chest CT on 10/14/23 which showed interval stability of the RUL nodule (Lung-RADS 3, probably benign findings).   Given the long-term persistent of the nodule, a repeat PET scan was performed on 11/19/23 which showed minimal FDG activity associated with 15 mm RUL nodule, as well as mild FDG uptake associated with a stable 8 mm nodular density in the superior RML. Low grade adenocarcinoma could not be excluded for either of these findings. Imaging otherwise showed no evidence of metastatic disease.    She was seen by NP Groce ([facility]) on 11/24/23 to review these findings. The patient was offered the options of continued surveillance vs biopsies of the RUL at that time, and she expressed interest in proceeding with tissue sampling.   She promptly underwent a bronchoscopy w/ biopsies of the RUL nodule on 12/07/23 under the care of Dr. Shelah. Cytology revealed findings consistent with adenocarcinoma from FNA and brushings of the RUL. Brushings of the RML were also submitted for cytology and showed no evidence of malignancy.   Dr. Lanny team has placed a referral to thoracic surgery for consideration of resection. She will need to have PFT's performed to assess her candidacy, especially given her ongoing/current smoking status. She did endorse some progressive SOB during her most recent visit with NP Groce on 12/14/23 and was advised to pursue smoking cessation.   In most recent history, she was seen in consultation by Dr. Davonna at [facility] med-onc on 12/21/23 to discuss treatment options. Chemotherapy is not indicated and she was instructed to proceed with a staging brain MRI which was performed on 12/22/23 and demonstrated  no evidence of intracranial metastatic disease.   (She is now scheduled to meet with Dr. Shyrl in consultation on 01/08/24).    PREVIOUS RADIATION THERAPY: No  PAST MEDICAL HISTORY:  has a past medical history of Allergy, Arthritis, Atypical mole  (06/05/2003), Atypical nevus (07/26/2003), Basal cell carcinoma (06/05/2003), Basal cell carcinoma (05/22/2004), Basal cell carcinoma (11/27/2004), Basal cell carcinoma (11/08/2009), Basal cell carcinoma (05/24/2012), Basal cell carcinoma (06/24/2012), Basal cell carcinoma (07/29/2012), Basal cell carcinoma (06/06/2019), Cataract, Closed compression fracture of L5 vertebra (HCC), Constipation, COPD (chronic obstructive pulmonary disease) (HCC), Coronary artery disease, COVID, Depression, GERD (gastroesophageal reflux disease), Heart murmur, History of kidney stones, adenomatous polyp of colon (10/03/2014), Hypercholesteremia, Hypertension, Panic attacks, Parkinson disease (HCC), Perimenopausal, Skin cancer, Squamous cell carcinoma of skin (06/05/2003), Squamous cell carcinoma of skin (05/22/2004), Squamous cell carcinoma of skin (11/27/2004), Squamous cell carcinoma of skin (03/04/2006), Squamous cell carcinoma of skin (11/26/2006), Squamous cell carcinoma of skin (05/24/2012), Squamous cell carcinoma of skin (04/05/2013), Squamous cell carcinoma of skin (08/17/2013), Squamous cell carcinoma of skin (07/30/2016), Squamous cell carcinoma of skin (05/12/2017), Squamous cell carcinoma of skin (11/23/2018), and Tobacco user.    PAST SURGICAL HISTORY: Past Surgical History:  Procedure Laterality Date   ABDOMINAL HYSTERECTOMY     BACK SURGERY     Spinal Fusion   CATARACT EXTRACTION W/PHACO Right 03/01/2021   Procedure: CATARACT EXTRACTION PHACO AND INTRAOCULAR LENS PLACEMENT (IOC);  Surgeon: Harrie Agent, MD;  Location: [facility];  Service: Ophthalmology;  Laterality: Right;  CDE: 7.31   CATARACT EXTRACTION W/PHACO Left 05/16/2021   Procedure: CATARACT EXTRACTION PHACO AND INTRAOCULAR LENS PLACEMENT (IOC);  Surgeon: Harrie Agent, MD;  Location: [facility];  Service: Ophthalmology;  Laterality: Left;  CDE 9.41    COLONOSCOPY     IR VERTEBROPLASTY CERV/THOR BX INC UNI/BIL INC/INJECT/IMAGING  04/16/2021   Right  foot toe surgery     UPPER GASTROINTESTINAL ENDOSCOPY     VIDEO BRONCHOSCOPY WITH ENDOBRONCHIAL NAVIGATION Right 12/07/2023   Procedure: VIDEO BRONCHOSCOPY WITH ENDOBRONCHIAL NAVIGATION;  Surgeon: Shelah Lamar RAMAN, MD;  Location: [facility];  Service: Pulmonary;  Laterality: Right;   vocal cord     polyp removal    FAMILY HISTORY: family history includes Alcohol abuse in her brother; Anxiety disorder in her brother and sister; Breast cancer in her niece; Colon cancer (age of onset: 70) in her brother; Colon polyps in her brother, brother, sister, and sister; Depression in her brother and sister; Drug abuse in her son; Esophageal cancer in her sister; Heart attack in her brother, father, and mother; Heart disease in her mother.  SOCIAL HISTORY:  reports that she has been smoking cigarettes. She started smoking about 54 years ago. She has a 54.6 pack-year smoking history. She has never used smokeless tobacco. She reports that she does not drink alcohol and does not use drugs.  ALLERGIES: Fentanyl , Lorcet 10-650 [hydrocodone -acetaminophen ], Sulfa  antibiotics, Tape, Tylenol  [acetaminophen ], and Wellbutrin  xl [bupropion  hcl er (xl)]  MEDICATIONS:  Current Outpatient Medications  Medication Sig Dispense Refill   acetaminophen  (TYLENOL ) 500 MG tablet Take 1,000 mg by mouth every 6 (six) hours as needed for moderate pain.     albuterol  (VENTOLIN  HFA) 108 (90 Base) MCG/ACT inhaler Inhale 1-2 puffs into the lungs every 6 (six) hours as needed for wheezing or shortness of breath. 8 g 5   alendronate  (FOSAMAX ) 70 MG tablet TAKE 1 TABLET BY MOUTH EVERY 7 DAYS with a full GLASS of water  ON an EMPTY stomach. DO not lay down FOR  AT least 2 HOURS 12 tablet 0   amLODipine  (NORVASC ) 10 MG tablet Take 1 tablet (10 mg total) by mouth daily. 90 tablet 3   atorvastatin  (LIPITOR) 80 MG tablet Take 1 tablet (80 mg total) by mouth daily. 90 tablet 3   carbidopa -levodopa  (SINEMET  IR) 10-100 MG tablet TAKE 1 TABLET BY  MOUTH THREE TIMES DAILY FOR parkinsons 90 tablet 1   ciprofloxacin  (CIPRO ) 500 MG tablet Take 1 tablet (500 mg total) by mouth 2 (two) times daily. Take with probiotic, call if you develop diarrhea. 20 tablet 0   clonazePAM  (KLONOPIN ) 1 MG tablet Take 1 tablet (1 mg total) by mouth 3 (three) times daily as needed. for anxiety 90 tablet 2   dexlansoprazole  (DEXILANT ) 60 MG capsule TAKE ONE CAPSULE BY MOUTH DAILY ON AN EMPTY STOMACH 30 capsule 5   DULoxetine  (CYMBALTA ) 60 MG capsule Take 1 capsule (60 mg total) by mouth 2 (two) times daily. 180 capsule 3   ezetimibe  (ZETIA ) 10 MG tablet Take 1 tablet (10 mg total) by mouth daily. 90 tablet 3   gabapentin (NEURONTIN) 600 MG tablet Take 600 mg by mouth 3 (three) times daily.     ipratropium-albuterol  (DUONEB) 0.5-2.5 (3) MG/3ML SOLN Take 3 mLs by nebulization every 6 (six) hours as needed. 360 mL 5   levocetirizine (XYZAL ) 5 MG tablet Take 1 tablet (5 mg total) by mouth every evening. 90 tablet 3   LINZESS  145 MCG CAPS capsule TAKE ONE CAPSULE BY MOUTH DAILY TO regulate bowel movement 30 capsule 5   lisinopril  (ZESTRIL ) 40 MG tablet Take 1 tablet (40 mg total) by mouth daily. 90 tablet 3   meclizine  (ANTIVERT ) 25 MG tablet TAKE 1 TABLET BY MOUTH THREE TIMES DAILY AS NEEDED FOR dizziness. 60 tablet 2   meloxicam (MOBIC) 15 MG tablet Take 15 mg by mouth daily.     methocarbamol  (ROBAXIN ) 750 MG tablet Take 750 mg by mouth every 6 (six) hours as needed.     naproxen sodium (ALEVE) 220 MG tablet Take 220 mg by mouth 2 (two) times daily as needed (pain).     rOPINIRole  (REQUIP ) 3 MG tablet TAKE 1 TABLET BY MOUTH AT BEDTIME 90 tablet 0   SYMBICORT  160-4.5 MCG/ACT inhaler INHALE 2 puffs into THE lungs TWICE DAILY 122.4 g 0   Tiotropium Bromide  Monohydrate (SPIRIVA  RESPIMAT) 2.5 MCG/ACT AERS INHALE 2 puffs BY MOUTH DAILY 24 g 2   Vitamin D , Ergocalciferol , (DRISDOL ) 1.25 MG (50000 UNIT) CAPS capsule TAKE 1 CAPSULE BY MOUTH WEEKLY 13 capsule 1   No current  facility-administered medications for this encounter.    REVIEW OF SYSTEMS:  Notable for that above.   PHYSICAL EXAM:  vitals were not taken for this visit.   General: Alert and oriented, in no acute distress *** HEENT: Head is normocephalic. Extraocular movements are intact. Oropharynx is clear. Neck: Neck is supple, no palpable cervical or supraclavicular lymphadenopathy. Heart: Regular in rate and rhythm with no murmurs, rubs, or gallops. Chest: Clear to auscultation bilaterally, with no rhonchi, wheezes, or rales. Abdomen: Soft, nontender, nondistended, with no rigidity or guarding. Extremities: No cyanosis or edema. Lymphatics: see Neck Exam Skin: No concerning lesions. Musculoskeletal: symmetric strength and muscle tone throughout. Neurologic: Cranial nerves II through XII are grossly intact. No obvious focalities. Speech is fluent. Coordination is intact. Psychiatric: Judgment and insight are intact. Affect is appropriate.   ECOG = ***  0 - Asymptomatic (Fully active, able to carry on all predisease activities without restriction)  1 - Symptomatic but completely ambulatory (Restricted in physically strenuous activity but ambulatory and able to carry out work of a light or sedentary nature. For example, light housework, office work)  2 - Symptomatic, <50% in bed during the day (Ambulatory and capable of all self care but unable to carry out any work activities. Up and about more than 50% of waking hours)  3 - Symptomatic, >50% in bed, but not bedbound (Capable of only limited self-care, confined to bed or chair 50% or more of waking hours)  4 - Bedbound (Completely disabled. Cannot carry on any self-care. Totally confined to bed or chair)  5 - Death   Raylene MM, Creech RH, Tormey DC, et al. [phone]). Toxicity and response criteria of the [facility]. Am. DOROTHA Bridges. Oncol. 5 (6): 649-55   LABORATORY DATA:  Lab Results  Component Value Date   WBC 10.3  12/07/2023   HGB 15.0 12/07/2023   HCT 46.0 12/07/2023   MCV 95.8 12/07/2023   PLT 368 12/07/2023   CMP     Component Value Date/Time   NA 143 12/07/2023 0838   NA 143 06/23/2023 1209   K 3.5 12/07/2023 0838   CL 107 12/07/2023 0838   CO2 28 12/07/2023 0838   GLUCOSE 104 (H) 12/07/2023 0838   BUN 10 12/07/2023 0838   BUN 10 06/23/2023 1209   CREATININE 0.72 12/07/2023 0838   CREATININE 0.88 08/30/2012 1317   CALCIUM  9.2 12/07/2023 0838   PROT 6.4 06/23/2023 1209   ALBUMIN 3.9 06/23/2023 1209   AST 12 06/23/2023 1209   ALT 13 06/23/2023 1209   ALKPHOS 91 06/23/2023 1209   BILITOT 0.2 06/23/2023 1209   EGFR 94 06/23/2023 1209   GFRNONAA >60 12/07/2023 0838   GFRNONAA 72 08/30/2012 1317         RADIOGRAPHY: MR BRAIN W WO CONTRAST Result Date: 12/24/2023 EXAM: MRI BRAIN WITH AND WITHOUT CONTRAST 12/22/2023 09:01:17 AM TECHNIQUE: Multiplanar multisequence MRI of the head/brain was performed with and without the administration of intravenous contrast. COMPARISON: MRI head 10/11/2009. CLINICAL HISTORY: Non-small cell lung cancer (NSCLC), staging. FINDINGS: The examination is mildly motion degraded. BRAIN AND VENTRICLES: There is no evidence of an acute infarct, intracranial hemorrhage, mass, midline shift, hydrocephalus, or extra-axial fluid collection. Patchy T2 hyperintensities in the cerebral white matter and pons are largely new and nonspecific but compatible with moderate chronic small vessel ischemic disease. A chronic lacunar infarct is noted in the left basal ganglia. Cerebral volume is normal. No abnormal enhancement is identified. Major intracranial vascular flow voids are preserved. ORBITS: Bilateral cataract extraction. SINUSES: No acute abnormality. BONES AND SOFT TISSUES: Normal bone marrow signal and enhancement. No acute soft tissue abnormality. IMPRESSION: 1. No evidence of intracranial metastases. 2. Moderate chronic small vessel ischemic disease. Electronically signed  by: Dasie Hamburg MD 12/24/2023 09:09 AM EDT RP Workstation: [id number]   DG Chest Port 1 View Result Date: 12/07/2023 CLINICAL DATA:  [id number] S/P bronchoscopy with biopsy [id number] EXAM: PORTABLE CHEST - 1 VIEW COMPARISON:  March 28, 2021, November 19, 2023 FINDINGS: Fiducial marker in the suprahilar right upper lung zone. No focal airspace consolidation, pleural effusion, or pneumothorax. No cardiomegaly. Tortuous aorta with aortic atherosclerosis. No acute fracture or destructive lesions. Multilevel thoracic osteophytosis. IMPRESSION: No acute cardiopulmonary abnormality. Electronically Signed   By: Rogelia Myers M.D.   On: 12/07/2023 12:57   DG C-ARM BRONCHOSCOPY Result Date: 12/07/2023 C-ARM BRONCHOSCOPY: Fluoroscopy was utilized by the requesting physician.  No radiographic  interpretation.      IMPRESSION/PLAN:***    On date of service, in total, I spent *** minutes on this encounter. Patient was seen in person.   __________________________________________   Lauraine Golden, MD  This document serves as a record of services personally performed by Lauraine Golden, MD. It was created on her behalf by Dorthy Fuse, a trained medical scribe. The creation of this record is based on the scribe's personal observations and the provider's statements to them. This document has been checked and approved by the attending provider.

## 2023-12-25 ENCOUNTER — Encounter: Payer: Self-pay | Admitting: Licensed Clinical Social Worker

## 2023-12-25 ENCOUNTER — Ambulatory Visit
Admission: RE | Admit: 2023-12-25 | Discharge: 2023-12-25 | Disposition: A | Discharge status: Home / Self Care | Source: Ambulatory Visit | Attending: Radiation Oncology | Admitting: Radiation Oncology

## 2023-12-25 DIAGNOSIS — Z800 Family history of malignant neoplasm of digestive organs: Secondary | ICD-10-CM | POA: Diagnosis not present

## 2023-12-25 DIAGNOSIS — R918 Other nonspecific abnormal finding of lung field: Secondary | ICD-10-CM | POA: Diagnosis not present

## 2023-12-25 DIAGNOSIS — Z85828 Personal history of other malignant neoplasm of skin: Secondary | ICD-10-CM | POA: Insufficient documentation

## 2023-12-25 DIAGNOSIS — Z79899 Other long term (current) drug therapy: Secondary | ICD-10-CM | POA: Diagnosis not present

## 2023-12-25 DIAGNOSIS — R011 Cardiac murmur, unspecified: Secondary | ICD-10-CM | POA: Diagnosis not present

## 2023-12-25 DIAGNOSIS — F17210 Nicotine dependence, cigarettes, uncomplicated: Secondary | ICD-10-CM | POA: Diagnosis not present

## 2023-12-25 DIAGNOSIS — M549 Dorsalgia, unspecified: Secondary | ICD-10-CM | POA: Diagnosis not present

## 2023-12-25 DIAGNOSIS — Z803 Family history of malignant neoplasm of breast: Secondary | ICD-10-CM | POA: Insufficient documentation

## 2023-12-25 DIAGNOSIS — C3411 Malignant neoplasm of upper lobe, right bronchus or lung: Secondary | ICD-10-CM | POA: Diagnosis not present

## 2023-12-25 DIAGNOSIS — Z791 Long term (current) use of non-steroidal anti-inflammatories (NSAID): Secondary | ICD-10-CM | POA: Diagnosis not present

## 2023-12-25 DIAGNOSIS — C3431 Malignant neoplasm of lower lobe, right bronchus or lung: Secondary | ICD-10-CM

## 2023-12-25 DIAGNOSIS — G20A1 Parkinson's disease without dyskinesia, without mention of fluctuations: Secondary | ICD-10-CM | POA: Diagnosis not present

## 2023-12-25 DIAGNOSIS — Z7983 Long term (current) use of bisphosphonates: Secondary | ICD-10-CM | POA: Diagnosis not present

## 2023-12-25 DIAGNOSIS — M6281 Muscle weakness (generalized): Secondary | ICD-10-CM | POA: Diagnosis not present

## 2023-12-25 DIAGNOSIS — M1990 Unspecified osteoarthritis, unspecified site: Secondary | ICD-10-CM | POA: Diagnosis not present

## 2023-12-25 DIAGNOSIS — I700 Atherosclerosis of aorta: Secondary | ICD-10-CM | POA: Insufficient documentation

## 2023-12-25 DIAGNOSIS — Z7951 Long term (current) use of inhaled steroids: Secondary | ICD-10-CM | POA: Diagnosis not present

## 2023-12-25 NOTE — Progress Notes (Signed)
 [facility] Clinical Social Work  Initial Assessment   [name] is a 68 y.o. year old female contacted by phone. Clinical Social Work was referred by medical provider for assessment of psychosocial needs.   SDOH (Social Determinants of Health) assessments performed: Yes SDOH Interventions    Flowsheet Row Office Visit from 12/21/2023 in [facility] Office Visit from 09/10/2023 in [facility] Clinical Support from 08/24/2023 in [facility] Office Visit from 12[facility]17/2022 in [facility] Office Visit from 08/20/2022 in [facility] Office Visit from 06/17/2022 in [facility]  SDOH Interventions        Food Insecurity Interventions AMB Referral -- Intervention Not Indicated -- -- --  Housing Interventions AMB Referral -- Intervention Not Indicated -- -- --  Transportation Interventions AMB Referral -- Intervention Not Indicated -- -- --  Utilities Interventions AMB Referral -- Intervention Not Indicated -- -- --  Alcohol Usage Interventions -- -- Intervention Not Indicated (Score <7) -- -- --  Depression Interventions/Treatment  Medication, Referral to Psychiatry Medication, Currently on Treatment Currently on Treatment Currently on Treatment Currently on Treatment Currently on Treatment  Financial Strain Interventions -- -- Intervention Not Indicated -- -- --  Physical Activity Interventions -- -- Intervention Not Indicated, Patient Declined -- -- --  Stress Interventions -- -- Intervention Not Indicated -- -- --  Health Literacy Interventions AMB Referral -- Intervention Not Indicated -- -- --    SDOH Screenings   Food Insecurity: Food Insecurity Present (12/24/2023)  Housing: High Risk (12/21/2023)  Transportation Needs: No Transportation Needs (12/24/2023)  Recent  Concern: Transportation Needs - Unmet Transportation Needs (12/21/2023)  Utilities: At Risk (12/24/2023)  Alcohol Screen: Low Risk  (08/24/2023)  Depression (PHQ2-9): Low Risk  (12/24/2023)  Recent Concern: Depression (PHQ2-9) - High Risk (12/21/2023)  Financial Resource Strain: High Risk (08/24/2023)  Physical Activity: Inactive (08/24/2023)  Social Connections: Socially Isolated (08/24/2023)  Stress: Stress Concern Present (08/24/2023)  Tobacco Use: High Risk (12/24/2023)  Health Literacy: Inadequate Health Literacy (12/21/2023)    PHQ 2/9:    12/24/2023   12:47 PM 12/21/2023   11:20 AM 09/10/2023   11:23 AM  Depression screen PHQ 2/9  Decreased Interest 2 3 2   Down, Depressed, Hopeless 2 2 2   PHQ - 2 Score 4 5 4   Altered sleeping  3 3  Tired, decreased energy  3 3  Change in appetite  2 2  Feeling bad or failure about yourself   0 1  Trouble concentrating  1 1  Moving slowly or fidgety/restless  0 1  Suicidal thoughts  0 0  PHQ-9 Score  14 15  Difficult doing work/chores  Somewhat difficult Very difficult     Distress Screen completed: No     No data to display            Family/Social Information:  Housing Arrangement: patient lives alone.  Pt's husband passed from lung cancer in 2019. Family members/support persons in your life? Pt reports she has a neighbor who assists w/ getting pt to the grocery store as well as a brother and son who can offer limited support.   Transportation concerns: Pt utilizes [employer] for transportation.   Employment: Retired .  Income source: Actor concerns: Yes, due to fixed income. Type of concern: Utilities Food access concerns: no  Religious or spiritual practice: Yes-[employer] Advanced directives: No Services Currently in place:  none  Coping/ Adjustment to diagnosis: Patient understands treatment plan and what happens next? Pt is scheduled to meet w/ a surgeon and radiology.  At this time it is not  anticipated pt will need systemic treatment.  Concerns about diagnosis and/or treatment: Overwhelmed by information and How will I care for myself Patient reported stressors: Anxiety/ nervousness and Adjusting to my illness Hopes and/or priorities: pt's priority is to complete diagnostics and start treatment w/ the hope of positive results. Patient enjoys not addressed Current coping skills/ strengths: Capable of independent living  and Motivation for treatment/growth     SUMMARY: Current SDOH Barriers:  Financial constraints related to fixed income  Clinical Social Work Clinical Goal(s):  Explore community resource options for unmet needs related to:  Financial Strain   Interventions: Discussed common feeling and emotions when being diagnosed with cancer, and the importance of support during treatment Informed patient of the support team roles and support services at [facility] Provided CSW contact information and encouraged patient to call with any questions or concerns Referred patient to community resources: Wadie Rung and informed of the [employer] if pt is determined to need systemic treatment.     Follow Up Plan: Patient will contact CSW with any support or resource needs Patient verbalizes understanding of plan: Yes    Devere JONELLE Manna, LCSW Clinical Social Worker [facility]

## 2024-01-01 ENCOUNTER — Encounter

## 2024-01-04 ENCOUNTER — Ambulatory Visit (HOSPITAL_COMMUNITY)

## 2024-01-04 LAB — FUNGUS CULTURE WITH STAIN

## 2024-01-04 LAB — FUNGUS CULTURE RESULT

## 2024-01-04 LAB — FUNGAL ORGANISM REFLEX

## 2024-01-06 ENCOUNTER — Ambulatory Visit (INDEPENDENT_AMBULATORY_CARE_PROVIDER_SITE_OTHER)

## 2024-01-06 DIAGNOSIS — C3490 Malignant neoplasm of unspecified part of unspecified bronchus or lung: Secondary | ICD-10-CM

## 2024-01-06 LAB — PULMONARY FUNCTION TEST
DL/VA % pred: 68 %
DL/VA: 2.8 ml/min/mmHg/L
DLCO cor % pred: 64 %
DLCO cor: 12.95 ml/min/mmHg
DLCO unc % pred: 67 %
DLCO unc: 13.54 ml/min/mmHg
FEF 25-75 Post: 0.94 L/s
FEF 25-75 Pre: 0.39 L/s
FEF2575-%Change-Post: 140 %
FEF2575-%Pred-Post: 48 %
FEF2575-%Pred-Pre: 20 %
FEV1-%Change-Post: 34 %
FEV1-%Pred-Post: 55 %
FEV1-%Pred-Pre: 41 %
FEV1-Post: 1.32 L
FEV1-Pre: 0.98 L
FEV1FVC-%Change-Post: 4 %
FEV1FVC-%Pred-Pre: 69 %
FEV6-%Change-Post: 28 %
FEV6-%Pred-Post: 78 %
FEV6-%Pred-Pre: 61 %
FEV6-Post: 2.36 L
FEV6-Pre: 1.83 L
FEV6FVC-%Change-Post: 0 %
FEV6FVC-%Pred-Post: 102 %
FEV6FVC-%Pred-Pre: 102 %
FVC-%Change-Post: 28 %
FVC-%Pred-Post: 76 %
FVC-%Pred-Pre: 59 %
FVC-Post: 2.4 L
FVC-Pre: 1.87 L
Post FEV1/FVC ratio: 55 %
Post FEV6/FVC ratio: 98 %
Pre FEV1/FVC ratio: 53 %
Pre FEV6/FVC Ratio: 98 %
RV % pred: 234 %
RV: 5.25 L
TLC % pred: 150 %
TLC: 7.84 L

## 2024-01-06 NOTE — Patient Instructions (Signed)
 Full PFT performed today.

## 2024-01-06 NOTE — Progress Notes (Signed)
 Full PFT performed today.

## 2024-01-07 NOTE — Progress Notes (Deleted)
 [address]       [place] [address]             [phone]                    [name] [facility] Medical Record #[id number] Date of Birth: 04/28/1955  Referring: Zollie Lowers, MD Primary Care: Zollie Lowers, MD Primary Cardiologist: Alvan Carrier, MD  Chief Complaint:   No chief complaint on file.   History of Present Illness:    [name] is a 68 y.o. female who presents for surgical evaluation of a ***    Smoking Hx: *** Zubrod Score: At the time of surgery this patient's most appropriate activity status/level should be described as: []     0    Normal activity, no symptoms []     1    Restricted in physical strenuous activity but ambulatory, able to do out light work []     2    Ambulatory and capable of self care, unable to do work activities, up and about               >50 % of waking hours                              []     3    Only limited self care, in bed greater than 50% of waking hours []     4    Completely disabled, no self care, confined to bed or chair []     5    Moribund     Past Medical History:  Diagnosis Date   Allergy    Arthritis    Atypical mole 06/05/2003   slight-mod-Left scapula (WS)   Atypical nevus 07/26/2003   persist. dysp- Left scapula (WS)   Basal cell carcinoma 06/05/2003   RIght chest (CX35FU)   Basal cell carcinoma 05/22/2004   superificial- RIght upper back- (CX35FU)   Basal cell carcinoma 11/27/2004   beside R nostril-(MOHS), below right outer nose (MOHS)   Basal cell carcinoma 11/08/2009   left post shoulder -(txpbx)   Basal cell carcinoma 05/24/2012   ulcerated- Left post shoulder- (CX35FU), ulcerated-mid forehead (EXC )   Basal cell carcinoma 06/24/2012   superificial- Right shoulder - (txpbx)    Basal cell carcinoma 07/29/2012    sclerosis- mid forehead (MOHS)   Basal cell carcinoma 06/06/2019   nod-right anterior neck (CX35FU)   Cataract    Closed compression fracture of L5  vertebra (HCC)    Constipation    uses OTC meds with help   COPD (chronic obstructive pulmonary disease) (HCC)    Coronary artery disease    minimal nonobstructive    COVID    Depression    GERD (gastroesophageal reflux disease)    Heart murmur    Moderate AS 12/11/2022 TTE   History of kidney stones    Hx of adenomatous polyp of colon 10/03/2014   Hypercholesteremia    Hypertension    Panic attacks    Parkinson disease (HCC)    Perimenopausal    Skin cancer    Squamous cell carcinoma of skin 06/05/2003   Right [place](CX35FU)   Squamous cell carcinoma of skin 05/22/2004   in situ- Left upperarm (CX35FU)   Squamous cell carcinoma of skin 11/27/2004   in situ- above Left elbow (CX35FU)   Squamous cell carcinoma of skin 03/04/2006  in situ- Left sideburn (CX35FU)   Squamous cell carcinoma of skin 11/26/2006   in situ- mid brow (Cx35FU)   Squamous cell carcinoma of skin 05/24/2012   in situ- Left chest- (CX35FU)   Squamous cell carcinoma of skin 04/05/2013   in situ- Left nose (Cx35FU)   Squamous cell carcinoma of skin 08/17/2013   well diff-above Left eye (txpbx)   Squamous cell carcinoma of skin 07/30/2016   in situ- right chest sup (Cx35FU)   Squamous cell carcinoma of skin 05/12/2017   in situ- RIght shoulder (CX35FU), in situ- Left forehead (CX35FU)   Squamous cell carcinoma of skin 11/23/2018   in situ- Right mid shin, inner- (MOHS)   Tobacco user     Past Surgical History:  Procedure Laterality Date   ABDOMINAL HYSTERECTOMY     BACK SURGERY     Spinal Fusion   CATARACT EXTRACTION W/PHACO Right 03/01/2021   Procedure: CATARACT EXTRACTION PHACO AND INTRAOCULAR LENS PLACEMENT (IOC);  Surgeon: Harrie Agent, MD;  Location: [facility];  Service: Ophthalmology;  Laterality: Right;  CDE: 7.31   CATARACT EXTRACTION W/PHACO Left 05/16/2021   Procedure: CATARACT EXTRACTION PHACO AND INTRAOCULAR LENS PLACEMENT (IOC);  Surgeon: Harrie Agent, MD;  Location: [facility];  Service:  Ophthalmology;  Laterality: Left;  CDE 9.41    COLONOSCOPY     IR VERTEBROPLASTY CERV/THOR BX INC UNI/BIL INC/INJECT/IMAGING  04/16/2021   Right foot toe surgery     UPPER GASTROINTESTINAL ENDOSCOPY     VIDEO BRONCHOSCOPY WITH ENDOBRONCHIAL NAVIGATION Right 12/07/2023   Procedure: VIDEO BRONCHOSCOPY WITH ENDOBRONCHIAL NAVIGATION;  Surgeon: Shelah Lamar RAMAN, MD;  Location: [facility];  Service: Pulmonary;  Laterality: Right;   vocal cord     polyp removal    Family History  Problem Relation Age of Onset   Heart disease Mother    Heart attack Mother    Heart attack Father    Colon polyps Sister    Esophageal cancer Sister    Anxiety disorder Sister    Depression Sister    Colon polyps Sister    Heart attack Brother    Colon cancer Brother 40       died at age 45   Anxiety disorder Brother    Depression Brother    Alcohol abuse Brother    Colon polyps Brother    Colon polyps Brother    Drug abuse Son    Breast cancer Niece    Rectal cancer Neg Hx    Stomach cancer Neg Hx      Social History   Tobacco Use  Smoking Status Every Day   Current packs/day: 1.00   Average packs/day: 1 pack/day for 54.7 years (54.7 ttl pk-yrs)   Types: Cigarettes   Start date: 05/01/1969  Smokeless Tobacco Never  Tobacco Comments   1 pack a day 12/14/2023 KRD    Social History   Substance and Sexual Activity  Alcohol Use No   Alcohol/week: 0.0 standard drinks of alcohol     Allergies  Allergen Reactions   Fentanyl  Other (See Comments)   Lorcet 10-650 [Hydrocodone -Acetaminophen ] Itching   Sulfa  Antibiotics Other (See Comments)    aching   Tape Other (See Comments)    Tear skin. Please use paper tape   Tylenol  [Acetaminophen ] Itching   Wellbutrin  Xl [Bupropion  Hcl Er (Xl)]     Upset stomach /bad taste in mouth    Current Outpatient Medications  Medication Sig Dispense Refill   acetaminophen  (TYLENOL ) 500 MG tablet Take 1,000  mg by mouth every 6 (six) hours as needed for  moderate pain.     albuterol  (VENTOLIN  HFA) 108 (90 Base) MCG/ACT inhaler Inhale 1-2 puffs into the lungs every 6 (six) hours as needed for wheezing or shortness of breath. 8 g 5   alendronate  (FOSAMAX ) 70 MG tablet TAKE 1 TABLET BY MOUTH EVERY 7 DAYS with a full GLASS of water  ON an EMPTY stomach. DO not lay down FOR AT least 2 HOURS 12 tablet 0   amLODipine  (NORVASC ) 10 MG tablet Take 1 tablet (10 mg total) by mouth daily. 90 tablet 3   atorvastatin  (LIPITOR) 80 MG tablet Take 1 tablet (80 mg total) by mouth daily. 90 tablet 3   carbidopa -levodopa  (SINEMET  IR) 10-100 MG tablet TAKE 1 TABLET BY MOUTH THREE TIMES DAILY FOR parkinsons 90 tablet 1   ciprofloxacin  (CIPRO ) 500 MG tablet Take 1 tablet (500 mg total) by mouth 2 (two) times daily. Take with probiotic, call if you develop diarrhea. 20 tablet 0   clonazePAM  (KLONOPIN ) 1 MG tablet Take 1 tablet (1 mg total) by mouth 3 (three) times daily as needed. for anxiety 90 tablet 2   dexlansoprazole  (DEXILANT ) 60 MG capsule TAKE ONE CAPSULE BY MOUTH DAILY ON AN EMPTY STOMACH 30 capsule 5   DULoxetine  (CYMBALTA ) 60 MG capsule Take 1 capsule (60 mg total) by mouth 2 (two) times daily. 180 capsule 3   ezetimibe  (ZETIA ) 10 MG tablet Take 1 tablet (10 mg total) by mouth daily. 90 tablet 3   gabapentin (NEURONTIN) 600 MG tablet Take 600 mg by mouth 3 (three) times daily.     ipratropium-albuterol  (DUONEB) 0.5-2.5 (3) MG/3ML SOLN Take 3 mLs by nebulization every 6 (six) hours as needed. 360 mL 5   levocetirizine (XYZAL ) 5 MG tablet Take 1 tablet (5 mg total) by mouth every evening. 90 tablet 3   LINZESS  145 MCG CAPS capsule TAKE ONE CAPSULE BY MOUTH DAILY TO regulate bowel movement 30 capsule 5   lisinopril  (ZESTRIL ) 40 MG tablet Take 1 tablet (40 mg total) by mouth daily. 90 tablet 3   meclizine  (ANTIVERT ) 25 MG tablet TAKE 1 TABLET BY MOUTH THREE TIMES DAILY AS NEEDED FOR dizziness. 60 tablet 2   meloxicam (MOBIC) 15 MG tablet Take 15 mg by mouth daily.      methocarbamol  (ROBAXIN ) 750 MG tablet Take 750 mg by mouth every 6 (six) hours as needed.     naproxen sodium (ALEVE) 220 MG tablet Take 220 mg by mouth 2 (two) times daily as needed (pain).     rOPINIRole  (REQUIP ) 3 MG tablet TAKE 1 TABLET BY MOUTH AT BEDTIME 90 tablet 0   SYMBICORT  160-4.5 MCG/ACT inhaler INHALE 2 puffs into THE lungs TWICE DAILY 122.4 g 0   Tiotropium Bromide  Monohydrate (SPIRIVA  RESPIMAT) 2.5 MCG/ACT AERS INHALE 2 puffs BY MOUTH DAILY 24 g 2   Vitamin D , Ergocalciferol , (DRISDOL ) 1.25 MG (50000 UNIT) CAPS capsule TAKE 1 CAPSULE BY MOUTH WEEKLY 13 capsule 1   No current facility-administered medications for this visit.    ROS   PHYSICAL EXAMINATION: There were no vitals taken for this visit. Physical Exam       I have independently reviewed the above radiology studies  and reviewed the findings with the patient.   Recent Lab Findings: Lab Results  Component Value Date   WBC 10.3 12/07/2023   HGB 15.0 12/07/2023   HCT 46.0 12/07/2023   PLT 368 12/07/2023   GLUCOSE 104 (H) 12/07/2023  CHOL 145 06/23/2023   TRIG 130 06/23/2023   HDL 43 06/23/2023   LDLCALC 79 06/23/2023   ALT 13 06/23/2023   AST 12 06/23/2023   NA 143 12/07/2023   K 3.5 12/07/2023   CL 107 12/07/2023   CREATININE 0.72 12/07/2023   BUN 10 12/07/2023   CO2 28 12/07/2023   TSH 1.820 05/25/2017   INR 1.0 04/16/2021   HGBA1C 5.9 (H) 07/12/2013    Diagnostic Studies & Laboratory data:     Recent Radiology Findings:   MR BRAIN W WO CONTRAST Result Date: 12/24/2023 EXAM: MRI BRAIN WITH AND WITHOUT CONTRAST 12/22/2023 09:01:17 AM TECHNIQUE: Multiplanar multisequence MRI of the head/brain was performed with and without the administration of intravenous contrast. COMPARISON: MRI head 10/11/2009. CLINICAL HISTORY: Non-small cell lung cancer (NSCLC), staging. FINDINGS: The examination is mildly motion degraded. BRAIN AND VENTRICLES: There is no evidence of an acute infarct, intracranial  hemorrhage, mass, midline shift, hydrocephalus, or extra-axial fluid collection. Patchy T2 hyperintensities in the cerebral white matter and pons are largely new and nonspecific but compatible with moderate chronic small vessel ischemic disease. A chronic lacunar infarct is noted in the left basal ganglia. Cerebral volume is normal. No abnormal enhancement is identified. Major intracranial vascular flow voids are preserved. ORBITS: Bilateral cataract extraction. SINUSES: No acute abnormality. BONES AND SOFT TISSUES: Normal bone marrow signal and enhancement. No acute soft tissue abnormality. IMPRESSION: 1. No evidence of intracranial metastases. 2. Moderate chronic small vessel ischemic disease. Electronically signed by: Dasie Hamburg MD 12/24/2023 09:09 AM EDT RP Workstation: [id number]     PFTs:  - FVC: 59% - FEV1: ***% -DLCO: 64%  FINAL MICROSCOPIC DIAGNOSIS:  A. LUNG, RUL, FINE NEEDLE ASPIRATION:  Adenocarcinoma  See comment   B. LUNG, RUL, BRUSHING:  Rare atypical cells consistent with adenocarcinoma   C. LUNG, RML TARGET 2, BRUSHING:  No malignant cells identified         Assessment / Plan:   68 y.o. female with a right upper lobe adenocarcinoma, and right middle lobe pulmonary nodule measuring 8mm.  Marginal lung function.  RULectomy and surveillance for the RML.      Smoking cessation.   I  spent {CHL ONC TIME VISIT - DTPQU:[id number]} with  the patient face to face in counseling and coordination of care.    [name] 01/07/2024 10:54 AM

## 2024-01-08 ENCOUNTER — Ambulatory Visit: Admitting: Thoracic Surgery (Cardiothoracic Vascular Surgery)

## 2024-01-11 ENCOUNTER — Other Ambulatory Visit: Payer: Self-pay | Admitting: Family Medicine

## 2024-01-11 ENCOUNTER — Ambulatory Visit: Admitting: Radiation Oncology

## 2024-01-11 ENCOUNTER — Inpatient Hospital Stay: Admitting: Oncology

## 2024-01-12 ENCOUNTER — Ambulatory Visit
Admission: RE | Admit: 2024-01-12 | Discharge: 2024-01-12 | Disposition: A | Discharge status: Home / Self Care | Source: Ambulatory Visit | Attending: Radiation Oncology | Admitting: Radiation Oncology

## 2024-01-12 DIAGNOSIS — R918 Other nonspecific abnormal finding of lung field: Secondary | ICD-10-CM | POA: Diagnosis not present

## 2024-01-12 DIAGNOSIS — C3431 Malignant neoplasm of lower lobe, right bronchus or lung: Secondary | ICD-10-CM | POA: Diagnosis present

## 2024-01-12 DIAGNOSIS — F17210 Nicotine dependence, cigarettes, uncomplicated: Secondary | ICD-10-CM | POA: Diagnosis not present

## 2024-01-12 DIAGNOSIS — Z510 Encounter for antineoplastic radiation therapy: Secondary | ICD-10-CM | POA: Insufficient documentation

## 2024-01-15 ENCOUNTER — Other Ambulatory Visit: Payer: Self-pay | Admitting: Acute Care

## 2024-01-15 ENCOUNTER — Encounter: Admitting: Thoracic Surgery (Cardiothoracic Vascular Surgery)

## 2024-01-18 ENCOUNTER — Ambulatory Visit: Admitting: Radiation Oncology

## 2024-01-18 DIAGNOSIS — Z510 Encounter for antineoplastic radiation therapy: Secondary | ICD-10-CM | POA: Diagnosis not present

## 2024-01-19 ENCOUNTER — Ambulatory Visit: Admitting: Radiation Oncology

## 2024-01-20 ENCOUNTER — Ambulatory Visit
Admission: RE | Admit: 2024-01-20 | Discharge: 2024-01-20 | Disposition: A | Source: Ambulatory Visit | Attending: Radiation Oncology | Admitting: Radiation Oncology

## 2024-01-20 ENCOUNTER — Other Ambulatory Visit: Payer: Self-pay

## 2024-01-20 DIAGNOSIS — Z510 Encounter for antineoplastic radiation therapy: Secondary | ICD-10-CM | POA: Diagnosis not present

## 2024-01-20 LAB — RAD ONC ARIA SESSION SUMMARY
Course Elapsed Days: 0
Plan Fractions Treated to Date: 1
Plan Prescribed Dose Per Fraction: 18 Gy
Plan Total Fractions Prescribed: 3
Plan Total Prescribed Dose: 54 Gy
Reference Point Dosage Given to Date: 18 Gy
Reference Point Session Dosage Given: 18 Gy
Session Number: 1

## 2024-01-21 ENCOUNTER — Ambulatory Visit: Admitting: Radiation Oncology

## 2024-01-22 ENCOUNTER — Ambulatory Visit
Admission: RE | Admit: 2024-01-22 | Discharge: 2024-01-22 | Disposition: A | Discharge status: Home / Self Care | Source: Ambulatory Visit | Attending: Radiation Oncology | Admitting: Radiation Oncology

## 2024-01-22 ENCOUNTER — Other Ambulatory Visit: Payer: Self-pay

## 2024-01-22 DIAGNOSIS — Z510 Encounter for antineoplastic radiation therapy: Secondary | ICD-10-CM | POA: Diagnosis not present

## 2024-01-22 LAB — RAD ONC ARIA SESSION SUMMARY
Course Elapsed Days: 2
Plan Fractions Treated to Date: 2
Plan Prescribed Dose Per Fraction: 18 Gy
Plan Total Fractions Prescribed: 3
Plan Total Prescribed Dose: 54 Gy
Reference Point Dosage Given to Date: 36 Gy
Reference Point Session Dosage Given: 18 Gy
Session Number: 2

## 2024-01-22 LAB — ACID FAST CULTURE WITH REFLEXED SENSITIVITIES (MYCOBACTERIA): Acid Fast Culture: NEGATIVE

## 2024-01-25 ENCOUNTER — Other Ambulatory Visit: Payer: Self-pay

## 2024-01-25 ENCOUNTER — Ambulatory Visit
Admission: RE | Admit: 2024-01-25 | Discharge: 2024-01-25 | Disposition: A | Discharge status: Home / Self Care | Source: Ambulatory Visit | Attending: Radiation Oncology | Admitting: Radiation Oncology

## 2024-01-25 DIAGNOSIS — C3411 Malignant neoplasm of upper lobe, right bronchus or lung: Secondary | ICD-10-CM

## 2024-01-25 DIAGNOSIS — Z510 Encounter for antineoplastic radiation therapy: Secondary | ICD-10-CM | POA: Diagnosis not present

## 2024-01-25 LAB — RAD ONC ARIA SESSION SUMMARY
Course Elapsed Days: 5
Plan Fractions Treated to Date: 3
Plan Prescribed Dose Per Fraction: 18 Gy
Plan Total Fractions Prescribed: 3
Plan Total Prescribed Dose: 54 Gy
Reference Point Dosage Given to Date: 54 Gy
Reference Point Session Dosage Given: 18 Gy
Session Number: 3

## 2024-01-26 ENCOUNTER — Ambulatory Visit: Admitting: Radiation Oncology

## 2024-01-26 ENCOUNTER — Inpatient Hospital Stay: Admitting: Oncology

## 2024-01-27 ENCOUNTER — Ambulatory Visit: Admitting: Radiation Oncology

## 2024-01-27 NOTE — Radiation Completion Notes (Signed)
 Patient Name: [name], [name] MRN: [id number] Date of Birth: 13-Apr-1955 Referring Physician: LAMAR CHRIS, M.D. Date of Service: 2024-01-27 Radiation Oncologist: Lauraine Golden, M.D. [facility]                             RADIATION ONCOLOGY END OF TREATMENT NOTE     Diagnosis: C34.31 Malignant neoplasm of lower lobe, right bronchus or lung Staging on 2023-12-21: Non-small cell lung cancer (HCC) T=cT1b, N=cN0, M=cM0 Intent: Curative     ==========DELIVERED PLANS==========  First Treatment Date: 2024-01-20 Last Treatment Date: 2024-01-25   Plan Name: Lung_RUL Site: Lung, Right Technique: SBRT/SRT-IMRT Mode: Photon Dose Per Fraction: 18 Gy Prescribed Dose (Delivered / Prescribed): 54 Gy / 54 Gy Prescribed Fxs (Delivered / Prescribed): 3 / 3     ==========ON TREATMENT VISIT DATES========== 2024-01-20, 2024-01-22, 2024-01-25, 2024-01-25     ==========UPCOMING VISITS========== 03/10/2024 [facility] ONC FOLLOW UP 20 Golden Lauraine, MD  03/10/2024 [facility] MYCHART VIDEO VISIT Okey Barnie SAUNDERS, MD  02/11/2024 [facility] CARE OFFICE VISIT - LINZIE Theophilus Roosevelt, MD  02/10/2024 [facility] ECHOCARDIOGRAM [facility] - ECHO 1 OUTPATIENT  02/04/2024 [facility] OFF[facility] VISIT Zollie Lowers, MD  02/01/2024 [facility] OFFICE VISIT 20 Davonna Siad, MD        ==========APPENDIX - ON TREATMENT VISIT NOTES==========   See weekly On Treatment Notes in Epic for details in the Media tab (listed as Progress notes on the On Treatment Visit Dates listed above).

## 2024-01-30 ENCOUNTER — Other Ambulatory Visit: Payer: Self-pay | Admitting: Family Medicine

## 2024-01-31 NOTE — Progress Notes (Deleted)
 Patient Care Team: Zollie Lowers, MD as PCP - General (Family Medicine) Alvan, Dorn FALCON, MD as PCP - Cardiology (Cardiology) Avram Lupita BRAVO, MD as Consulting Physician (Gastroenterology) Okey Barnie SAUNDERS, MD as Consulting Physician (Behavioral Health) Livingston Rigg, MD as Consulting Physician (Dermatology) Theophilus Roosevelt, MD as Consulting Physician (Pulmonary Disease) Onesimo Oneil LABOR, MD as Consulting Physician (Orthopedic Surgery) Euna Read, MD as Referring Physician (Neurosurgery) Harrie Agent, MD as Consulting Physician (Ophthalmology) Dr [facility], OD (Optometry) Davonna Siad, MD as Medical Oncologist (Medical Oncology) Celestia Joesph SQUIBB, RN as Oncology Nurse Navigator (Medical Oncology)  Clinic Day:  01/31/2024  Referring physician: Zollie Lowers, MD   CHIEF COMPLAINT:  CC: Non-small cell lung carcinoma    ASSESSMENT & PLAN:   Assessment & Plan: [name]  is a 68 y.o. female with non small cell lung cancer   Non-small cell lung cancer Patient had initial lung cancer screening in 2024 consistent with and lung nodule.  Recent lung cancer screening CT scan showed an increase in size which was followed by PET scan and EBUS with biopsy consistent with adenocarcinoma.   - Patient likely has a stage I non-small cell lung cancer of right lung pending MRI of brain -We reviewed the PET scan findings together.  She does have low SUV nodule in her right lung. - She is scheduled for an MRI of brain tomorrow - Patient is scheduled to see radiation oncology on 12/25/2023 and thoracic surgery on 01/08/2024 - She also has new PFTs and echocardiogram pending before seeing thoracic surgery -Discussed with the patient the diagnosis and prognosis at this time.  If patient's MRI brain is negative, she does have stage I non-small cell lung cancer for which standard of care is either lobectomy or SBRT.  She will not need chemotherapy at this time. -  Patient will need frequent monitoring after the standard of care.   Return to clinic after meeting with radiation oncologist and thoracic surgery to discuss further management.   Anxiety, nervousness Patient is anxious about the diagnosis but has no mood abnormalities at this time.   - Will refer to our social worker for any social needs.       The patient understands the plans discussed today and is in agreement with them.  She knows to contact our office if she develops concerns prior to her next appointment.  *** minutes of total time was spent for this patient encounter, including preparation,face-to-face counseling with the patient and coordination of care, physical exam, and documentation of the encounter.   Siad Davonna, MD  [facility] - A DEPT OF [facility] [address] [place] [address] Dept: [phone] Dept Fax: [phone]   No orders of the defined types were placed in this encounter.    ONCOLOGY HISTORY:   Diagnosis: stage I non small cell lung cancer   -03/24/2022: CT Chest Lung CA screen low dose without CM: Ground-glass nodule of the right upper lobe has doubled in size when compared with prior lung cancer screening CT and is suspicious for primary lung adenocarcinoma. Lung-RADS 4X, highly suspicious.  -04/03/2022:PET scan: Ground-glass 1.0 cm posterior right upper lobe pulmonary nodule demonstrates no significant FDG uptake. No hypermetabolic thoracic adenopathy or distant metastatic disease. Left adrenal adenoma, for which no follow-up imaging is recommended.  -10/03/2022: CT Chest low dose without contrast: Lung-RADS 2, benign appearance or behavior. Continue annual screening with low-dose chest CT  without contrast in 12 months. Left adrenal adenoma. Tiny left renal stone.  -07/13/2023: CT Chest low dose without contrast: Lung-RADS 4A, suspicious. Continued progression of mixed  density right upper lobe pulmonary nodule.  -10/14/2023: CT Chest low dose without contrast: Part solid nodule in the posterior segment right upper lobe is grossly stable. Lung-RADS 3, probably benign findings.  -11/20/2023: PET scan: 15 mm part solid nodule in the posterior right upper lobe shows minimal FDG activity, similar to prior PET in 2024. This nodule does show progression in size and attenuation since prior earlier studies in 2024, suspicious for low-grade adenocarcinoma. 8 mm nodular density in the superior right middle lobe shows mild FDG uptake, but remains stable in both size and FDG activity compared to previous exams. Low-grade adenocarcinoma cannot be excluded. No evidence of metastatic disease. Bilateral nephrolithiasis, with mild right hydronephrosis due to 11 mm calculus in the right renal pelvis. Colonic diverticulosis, without radiographic evidence of diverticulitis. -12/07/2023: EBUS with biopsy:  -Right upper lobe FNA: Adenocarcinoma - Right middle lobe brushing: No malignant cells identified -12/22/2023: MRI brain: . No evidence of intracranial metastases.  -11/19/205-01/25/2024: SBRT the right lung lesion  Current Treatment:  ***  INTERVAL HISTORY:   Discussed the use of AI scribe software for clinical note transcription with the patient, who gave verbal consent to proceed.  History of Present Illness        I have reviewed the past medical history, past surgical history, social history and family history with the patient and they are unchanged from previous note.  ALLERGIES:  is allergic to fentanyl , lorcet 10-650 [hydrocodone -acetaminophen ], sulfa  antibiotics, tape, tylenol  [acetaminophen ], and wellbutrin  xl [bupropion  hcl er (xl)].  MEDICATIONS:  Current Outpatient Medications  Medication Sig Dispense Refill   acetaminophen  (TYLENOL ) 500 MG tablet Take 1,000 mg by mouth every 6 (six) hours as needed for moderate pain.     albuterol  (VENTOLIN  HFA) 108 (90  Base) MCG/ACT inhaler Inhale 1-2 puffs into the lungs every 6 (six) hours as needed for wheezing or shortness of breath. 8 g 5   alendronate  (FOSAMAX ) 70 MG tablet TAKE 1 TABLET BY MOUTH EVERY 7 DAYS with a full GLASS of water  ON an EMPTY stomach. DO not lay down FOR AT least 2 HOURS 12 tablet 0   amLODipine  (NORVASC ) 10 MG tablet Take 1 tablet (10 mg total) by mouth daily. 90 tablet 3   atorvastatin  (LIPITOR) 80 MG tablet Take 1 tablet (80 mg total) by mouth daily. 90 tablet 3   carbidopa -levodopa  (SINEMET  IR) 10-100 MG tablet TAKE 1 TABLET BY MOUTH THREE TIMES DAILY FOR parkinsons 90 tablet 1   ciprofloxacin  (CIPRO ) 500 MG tablet Take 1 tablet (500 mg total) by mouth 2 (two) times daily. Take with probiotic, call if you develop diarrhea. 20 tablet 0   clonazePAM  (KLONOPIN ) 1 MG tablet Take 1 tablet (1 mg total) by mouth 3 (three) times daily as needed. for anxiety 90 tablet 2   dexlansoprazole  (DEXILANT ) 60 MG capsule TAKE ONE CAPSULE BY MOUTH DAILY ON AN EMPTY STOMACH 30 capsule 5   DULoxetine  (CYMBALTA ) 60 MG capsule Take 1 capsule (60 mg total) by mouth 2 (two) times daily. 180 capsule 3   ezetimibe  (ZETIA ) 10 MG tablet Take 1 tablet (10 mg total) by mouth daily. 90 tablet 3   gabapentin (NEURONTIN) 600 MG tablet Take 600 mg by mouth 3 (three) times daily.     ipratropium-albuterol  (DUONEB) 0.5-2.5 (3) MG/3ML SOLN Take 3 mLs by nebulization every 6 (  six) hours as needed. 360 mL 5   levocetirizine (XYZAL ) 5 MG tablet Take 1 tablet (5 mg total) by mouth every evening. 90 tablet 3   LINZESS  145 MCG CAPS capsule TAKE ONE CAPSULE BY MOUTH DAILY TO regulate bowel movement 30 capsule 5   lisinopril  (ZESTRIL ) 40 MG tablet Take 1 tablet (40 mg total) by mouth daily. 90 tablet 3   meclizine  (ANTIVERT ) 25 MG tablet TAKE 1 TABLET BY MOUTH THREE TIMES DAILY AS NEEDED FOR dizziness. 60 tablet 2   meloxicam (MOBIC) 15 MG tablet Take 15 mg by mouth daily.     methocarbamol  (ROBAXIN ) 750 MG tablet Take 750 mg  by mouth every 6 (six) hours as needed.     naproxen sodium (ALEVE) 220 MG tablet Take 220 mg by mouth 2 (two) times daily as needed (pain).     rOPINIRole  (REQUIP ) 3 MG tablet TAKE 1 TABLET BY MOUTH AT BEDTIME 90 tablet 0   SYMBICORT  160-4.5 MCG/ACT inhaler INHALE 2 puffs into THE lungs TWICE DAILY 122.4 g 0   Tiotropium Bromide  Monohydrate (SPIRIVA  RESPIMAT) 2.5 MCG/ACT AERS INHALE 2 puffs BY MOUTH DAILY 24 g 2   Vitamin D , Ergocalciferol , (DRISDOL ) 1.25 MG (50000 UNIT) CAPS capsule TAKE 1 CAPSULE BY MOUTH WEEKLY 13 capsule 1   No current facility-administered medications for this visit.     VITALS:  There were no vitals taken for this visit.  Wt Readings from Last 3 Encounters:  12/25/23 (P) 135 lb 8 oz (61.5 kg)  12/21/23 139 lb 12.4 oz (63.4 kg)  12/14/23 139 lb (63 kg)    There is no height or weight on file to calculate BMI.  Performance status (ECOG): {CHL ONC [id number]  PHYSICAL EXAM:   GENERAL:alert, no distress and comfortable SKIN: skin color, texture, turgor are normal, no rashes or significant lesions EYES: normal, Conjunctiva are pink and non-injected, sclera clear OROPHARYNX:no exudate, no erythema and lips, buccal mucosa, and tongue normal  NECK: supple, thyroid  normal size, non-tender, without nodularity LYMPH:  no palpable lymphadenopathy in the cervical, axillary or inguinal LUNGS: clear to auscultation and percussion with normal breathing effort HEART: regular rate & rhythm and no murmurs and no lower extremity edema ABDOMEN:abdomen soft, non-tender and normal bowel sounds Musculoskeletal:no cyanosis of digits and no clubbing  NEURO: alert & oriented x 3 with fluent speech, no focal motor/sensory deficits  LABORATORY DATA:  I have reviewed the data as listed     Component Value Date/Time   NA 143 12/07/2023 0838   NA 143 06/23/2023 1209   K 3.5 12/07/2023 0838   CL 107 12/07/2023 0838   CO2 28 12/07/2023 0838   GLUCOSE 104 (H) 12/07/2023 0838    BUN 10 12/07/2023 0838   BUN 10 06/23/2023 1209   CREATININE 0.72 12/07/2023 0838   CREATININE 0.88 08/30/2012 1317   CALCIUM  9.2 12/07/2023 0838   PROT 6.4 06/23/2023 1209   ALBUMIN 3.9 06/23/2023 1209   AST 12 06/23/2023 1209   ALT 13 06/23/2023 1209   ALKPHOS 91 06/23/2023 1209   BILITOT 0.2 06/23/2023 1209   GFRNONAA >60 12/07/2023 0838   GFRNONAA 72 08/30/2012 1317   GFRAA 99 02/28/2020 1607   GFRAA 83 08/30/2012 1317     Lab Results  Component Value Date   WBC 10.3 12/07/2023   NEUTROABS 7.1 (H) 06/23/2023   HGB 15.0 12/07/2023   HCT 46.0 12/07/2023   MCV 95.8 12/07/2023   PLT 368 12/07/2023      Chemistry  Component Value Date/Time   NA 143 12/07/2023 0838   NA 143 06/23/2023 1209   K 3.5 12/07/2023 0838   CL 107 12/07/2023 0838   CO2 28 12/07/2023 0838   BUN 10 12/07/2023 0838   BUN 10 06/23/2023 1209   CREATININE 0.72 12/07/2023 0838   CREATININE 0.88 08/30/2012 1317      Component Value Date/Time   CALCIUM  9.2 12/07/2023 0838   ALKPHOS 91 06/23/2023 1209   AST 12 06/23/2023 1209   ALT 13 06/23/2023 1209   BILITOT 0.2 06/23/2023 1209        RADIOGRAPHIC STUDIES: I have personally reviewed the radiological images as listed and agreed with the findings in the report.  MR BRAIN W WO CONTRAST EXAM: MRI BRAIN WITH AND WITHOUT CONTRAST 12/22/2023 09:01:17 AM  TECHNIQUE: Multiplanar multisequence MRI of the head/brain was performed with and without the administration of intravenous contrast.  COMPARISON: MRI head 10/11/2009.  CLINICAL HISTORY: Non-small cell lung cancer (NSCLC), staging.  FINDINGS:  The examination is mildly motion degraded.  BRAIN AND VENTRICLES: There is no evidence of an acute infarct, intracranial hemorrhage, mass, midline shift, hydrocephalus, or extra-axial fluid collection. Patchy T2 hyperintensities in the cerebral white matter and pons are largely new and nonspecific but compatible with moderate chronic small  vessel ischemic disease. A chronic lacunar infarct is noted in the left basal ganglia. Cerebral volume is normal. No abnormal enhancement is identified. Major intracranial vascular flow voids are preserved.  ORBITS: Bilateral cataract extraction.  SINUSES: No acute abnormality.  BONES AND SOFT TISSUES: Normal bone marrow signal and enhancement. No acute soft tissue abnormality.  IMPRESSION: 1. No evidence of intracranial metastases. 2. Moderate chronic small vessel ischemic disease.  Electronically signed by: Dasie Hamburg MD 12/24/2023 09:09 AM EDT RP Workstation: [id number]

## 2024-02-01 ENCOUNTER — Inpatient Hospital Stay: Admitting: Oncology

## 2024-02-04 ENCOUNTER — Encounter: Payer: Self-pay | Admitting: Family Medicine

## 2024-02-04 ENCOUNTER — Ambulatory Visit: Payer: Self-pay | Admitting: Family Medicine

## 2024-02-04 VITALS — BP 128/72 | HR 91 | Temp 97.8°F | Ht 65.0 in | Wt 138.0 lb

## 2024-02-04 DIAGNOSIS — E782 Mixed hyperlipidemia: Secondary | ICD-10-CM

## 2024-02-04 DIAGNOSIS — M1990 Unspecified osteoarthritis, unspecified site: Secondary | ICD-10-CM

## 2024-02-04 DIAGNOSIS — R251 Tremor, unspecified: Secondary | ICD-10-CM

## 2024-02-04 DIAGNOSIS — K2100 Gastro-esophageal reflux disease with esophagitis, without bleeding: Secondary | ICD-10-CM

## 2024-02-04 DIAGNOSIS — J439 Emphysema, unspecified: Secondary | ICD-10-CM

## 2024-02-04 DIAGNOSIS — K5903 Drug induced constipation: Secondary | ICD-10-CM

## 2024-02-04 DIAGNOSIS — M5412 Radiculopathy, cervical region: Secondary | ICD-10-CM

## 2024-02-04 LAB — LIPID PANEL

## 2024-02-04 MED ORDER — SPIRIVA RESPIMAT 2.5 MCG/ACT IN AERS: 2.0000 | INHALATION_SPRAY | Freq: Every day | RESPIRATORY_TRACT | 3 refills | Status: AC

## 2024-02-04 MED ORDER — PREDNISONE 10 MG PO TABS: ORAL_TABLET | ORAL | 0 refills | Status: AC

## 2024-02-04 MED ORDER — GABAPENTIN 600 MG PO TABS: 1200.0000 mg | ORAL_TABLET | Freq: Three times a day (TID) | ORAL | 1 refills | Status: AC

## 2024-02-04 MED ORDER — LINACLOTIDE 145 MCG PO CAPS: 145.0000 ug | ORAL_CAPSULE | Freq: Every day | ORAL | 5 refills | Status: AC

## 2024-02-04 MED ORDER — LEVOCETIRIZINE DIHYDROCHLORIDE 5 MG PO TABS: 5.0000 mg | ORAL_TABLET | Freq: Every evening | ORAL | 3 refills | Status: AC

## 2024-02-04 MED ORDER — BUDESONIDE-FORMOTEROL FUMARATE 160-4.5 MCG/ACT IN AERO: 2.0000 | INHALATION_SPRAY | Freq: Two times a day (BID) | RESPIRATORY_TRACT | 0 refills | Status: AC

## 2024-02-04 MED ORDER — KETOROLAC TROMETHAMINE 60 MG/2ML IM SOLN
60.0000 mg | Freq: Once | INTRAMUSCULAR | Status: AC
  Administered 2024-02-04: 60 mg via INTRAMUSCULAR

## 2024-02-04 MED ORDER — DEXLANSOPRAZOLE 60 MG PO CPDR: DELAYED_RELEASE_CAPSULE | ORAL | 5 refills | Status: AC

## 2024-02-04 MED ORDER — ROPINIROLE HCL 3 MG PO TABS: 3.0000 mg | ORAL_TABLET | Freq: Every day | ORAL | 0 refills | Status: AC

## 2024-02-04 MED ORDER — CARBIDOPA-LEVODOPA 10-100 MG PO TABS: 1.0000 | ORAL_TABLET | Freq: Three times a day (TID) | ORAL | 1 refills | Status: AC

## 2024-02-04 NOTE — Progress Notes (Signed)
 Subjective:  Patient ID: [name], female    DOB: Sep 09, 1955  Age: 68 y.o. MRN: [id number]  CC: Medical Management of Chronic Issues (Left shoulder and arm pain. Pain goes into back. Injured it a couple years ago and was told she had a torn rotator cuff. Causing numbness in left hand as well. Aches constantly. Treating with gabapentin  and meloxicam but only helping some and no longer sees doctor that prescribes it. )   HPI  Discussed the use of AI scribe software for clinical note transcription with the patient, who gave verbal consent to proceed.  History of Present Illness [name] is a 68 year old female who presents with worsening shoulder and neck pain.  She experiences significant pain in her left shoulder, arm, and neck, with numbness extending to her hand. The pain has worsened over time, impacting her ability to stand for more than five minutes. This began approximately two years ago following an incident where she fractured her spine and ribs while assisting her sister-in-law. She was diagnosed with a torn rotator cuff by an orthopedist, who recommended surgery, but she has not pursued this option.  She was prescribed Suboxone strips for pain management about six months ago but discontinued use due to adverse effects, including feeling sick. She has not tried Suboxone pills and has not returned to the clinic for further evaluation. Currently, she is taking gabapentin  600 mg, two pills three times a day, which helps with her pain. She recently ran out of gabapentin , which exacerbated her symptoms.  She has a history of radiation therapy for cancer, with a follow-up appointment scheduled for December 17th. She missed a previous appointment due to a scheduling error and is concerned about whether the radiation therapy has fully addressed her cancer.  She has a history of smoking but has cut back significantly. She is currently taking carbidopa -levodopa  for tremors and  cramping, which she finds beneficial.  She mentions undergoing recent blood work and x-rays at [facility], including imaging of her neck and back, but is unsure of the results. She has a history of arthritis and herniated discs, with previous imaging of her lower back.          02/04/2024   11:16 AM 12/24/2023   12:47 PM 12/21/2023   11:20 AM  Depression screen PHQ 2/9  Decreased Interest 1 2 3   Down, Depressed, Hopeless 1 2 2   PHQ - 2 Score 2 4 5   Altered sleeping 1  3  Tired, decreased energy 2  3  Change in appetite 1  2  Feeling bad or failure about yourself  0  0  Trouble concentrating 1  1  Moving slowly or fidgety/restless 0  0  Suicidal thoughts 0  0  PHQ-9 Score 7  14   Difficult doing work/chores Somewhat difficult  Somewhat difficult     Data saved with a previous flowsheet row definition    History Gary has a past medical history of Allergy, Arthritis, Atypical mole (06/05/2003), Atypical nevus (07/26/2003), Basal cell carcinoma (06/05/2003), Basal cell carcinoma (05/22/2004), Basal cell carcinoma (11/27/2004), Basal cell carcinoma (11/08/2009), Basal cell carcinoma (05/24/2012), Basal cell carcinoma (06/24/2012), Basal cell carcinoma (07/29/2012), Basal cell carcinoma (06/06/2019), Cataract, Closed compression fracture of L5 vertebra (HCC), Constipation, COPD (chronic obstructive pulmonary disease) (HCC), Coronary artery disease, COVID, Depression, GERD (gastroesophageal reflux disease), Heart murmur, History of kidney stones, adenomatous polyp of colon (10/03/2014), Hypercholesteremia, Hypertension, Panic attacks, Parkinson disease (HCC), Perimenopausal, Skin cancer, Squamous  cell carcinoma of skin (06/05/2003), Squamous cell carcinoma of skin (05/22/2004), Squamous cell carcinoma of skin (11/27/2004), Squamous cell carcinoma of skin (03/04/2006), Squamous cell carcinoma of skin (11/26/2006), Squamous cell carcinoma of skin (05/24/2012), Squamous cell carcinoma of  skin (04/05/2013), Squamous cell carcinoma of skin (08/17/2013), Squamous cell carcinoma of skin (07/30/2016), Squamous cell carcinoma of skin (05/12/2017), Squamous cell carcinoma of skin (11/23/2018), and Tobacco user.   She has a past surgical history that includes Abdominal hysterectomy; vocal cord; Right foot toe surgery; Colonoscopy; Upper gastrointestinal endoscopy; Cataract extraction w/PHACO (Right, 03/01/2021); IR VERTEBROPLASTY CERV/THOR BX INC UNI/BIL INC/INJECT/IMAGING (04/16/2021); Cataract extraction w/PHACO (Left, 05/16/2021); Back surgery; and Video bronchoscopy with endobronchial navigation (Right, 12/07/2023).   Her family history includes Alcohol abuse in her brother; Anxiety disorder in her brother and sister; Breast cancer in her niece; Colon cancer (age of onset: 18) in her brother; Colon polyps in her brother, brother, sister, and sister; Depression in her brother and sister; Drug abuse in her son; Esophageal cancer in her sister; Heart attack in her brother, father, and mother; Heart disease in her mother.She reports that she has been smoking cigarettes. She started smoking about 54 years ago. She has a 54.8 pack-year smoking history. She has never used smokeless tobacco. She reports that she does not drink alcohol and does not use drugs.    ROS Review of Systems  Constitutional: Negative.   HENT:  Negative for congestion.   Eyes:  Negative for visual disturbance.  Respiratory:  Negative for shortness of breath.   Cardiovascular:  Negative for chest pain.  Gastrointestinal:  Negative for abdominal pain, constipation, diarrhea, nausea and vomiting.  Genitourinary:  Negative for difficulty urinating.  Musculoskeletal:  Positive for arthralgias and myalgias.  Neurological:  Negative for headaches.  Psychiatric/Behavioral:  Negative for sleep disturbance.     Objective:  BP 128/72   Pulse 91   Temp 97.8 F (36.6 C)   Ht 5' 5 (1.651 m)   Wt 138 lb (62.6 kg)   SpO2 94%    BMI 22.96 kg/m   BP Readings from Last 3 Encounters:  02/04/24 128/72  12/25/23 (P) 122/60  12/21/23 (!) 111/59    Wt Readings from Last 3 Encounters:  02/04/24 138 lb (62.6 kg)  12/25/23 (P) 135 lb 8 oz (61.5 kg)  12/21/23 139 lb 12.4 oz (63.4 kg)     Physical Exam Physical Exam VITALS: BP- 128/72 GENERAL: Alert, cooperative, well developed, no acute distress. HEENT: Normocephalic, normal oropharynx, moist mucous membranes. CHEST: Wheezing present. Clear to auscultation bilaterally, no rhonchi or crackles. CARDIOVASCULAR: Normal heart rate and rhythm, S1 and S2 normal without murmurs. ABDOMEN: Soft, non-tender, non-distended, without organomegaly, normal bowel sounds. EXTREMITIES: No cyanosis or edema. NEUROLOGICAL: Cranial nerves grossly intact, moves all extremities without gross motor or sensory deficit.   Assessment & Plan:  Cervical radiculopathy -     CBC with Differential/Platelet -     CMP14+EGFR -     Ketorolac  Tromethamine   Pulmonary emphysema (HCC) -     Spiriva  Respimat; Inhale 2 puffs into the lungs daily.  Dispense: 12 g; Refill: 3 -     CBC with Differential/Platelet -     CMP14+EGFR  Tremor -     rOPINIRole  HCl; Take 1 tablet (3 mg total) by mouth at bedtime.  Dispense: 90 tablet; Refill: 0 -     CBC with Differential/Platelet -     CMP14+EGFR  Gastroesophageal reflux disease with esophagitis without hemorrhage -  Dexlansoprazole ; TAKE ONE CAPSULE BY MOUTH DAILY ON AN EMPTY STOMACH  Dispense: 30 capsule; Refill: 5 -     CBC with Differential/Platelet -     CMP14+EGFR  Drug-induced constipation -     linaCLOtide ; Take 1 capsule (145 mcg total) by mouth daily before breakfast.  Dispense: 30 capsule; Refill: 5 -     CBC with Differential/Platelet -     CMP14+EGFR  Osteoarthritis, unspecified osteoarthritis type, unspecified site -     CBC with Differential/Platelet -     CMP14+EGFR  Mixed hyperlipidemia -     Lipid panel  Other  orders -     Budesonide -Formoterol  Fumarate; Inhale 2 puffs into the lungs 2 (two) times daily.  Dispense: 122.4 g; Refill: 0 -     Levocetirizine Dihydrochloride ; Take 1 tablet (5 mg total) by mouth every evening.  Dispense: 90 tablet; Refill: 3 -     Carbidopa -Levodopa ; Take 1 tablet by mouth 3 (three) times daily. FOR parkinsons  Dispense: 270 tablet; Refill: 1 -     Gabapentin ; Take 2 tablets (1,200 mg total) by mouth 3 (three) times daily.  Dispense: 540 tablet; Refill: 1 -     predniSONE ; Take 5 daily for 3 days followed by 4,3,2 and 1 for 3 days each.  Dispense: 45 tablet; Refill: 0    Assessment and Plan Assessment & Plan Chronic pain due to musculoskeletal injury with cervical radiculopathy and history of rotator cuff tear   Chronic pain affects her left shoulder, arm, and neck, likely due to cervical radiculopathy and a torn rotator cuff. Symptoms include hand numbness and difficulty standing for long periods. An MRI previously showed a torn rotator cuff, and surgery was advised but not pursued. Pain management has been difficult, with Suboxone causing adverse effects. She currently uses gabapentin  and meloxicam for pain. Prescribed prednisone  to reduce inflammation and administered a ketorolac  injection for immediate relief. Renewed gabapentin  and meloxicam prescriptions. Advised follow-up with orthopedic specialist Dr. Burnetta for potential surgery. Will consider an MRI if symptoms persist to evaluate surgical candidacy and discuss physical therapy if indicated.  Osteoarthritis and degenerative disc disease   She experiences chronic back pain due to degenerative disc disease and osteoarthritis. Imaging showed significant lower back degeneration with bone-on-bone contact. Pain management continues with gabapentin  and meloxicam. Ordered blood work to assess overall health.  Ensured an adequate supply of wipes and Depends.  Chronic obstructive pulmonary disease (COPD)   COPD symptoms are  exacerbated by ongoing smoking, though she has reduced her smoking. Wheezing was noted during examination. Encouraged smoking cessation to improve respiratory symptoms.  Essential hypertension   Her blood pressure is well-controlled with the current medication regimen, with a recent reading of 128/72 mmHg. Continue the current antihypertensive regimen.  Mixed hyperlipidemia   Managed with atorvastatin  and ezetimibe . Ordered blood work to assess lipid levels.  Tremor   Her tremor is effectively managed with carbidopa -levodopa . Continue the carbidopa -levodopa  regimen.       Follow-up: No follow-ups on file.  Butler Der, M.D.

## 2024-02-05 LAB — CBC WITH DIFFERENTIAL/PLATELET
Basophils Absolute: 0 x10E3/uL (ref 0.0–0.2)
Basos: 0 %
EOS (ABSOLUTE): 0.2 x10E3/uL (ref 0.0–0.4)
Eos: 2 %
Hematocrit: 41.5 % (ref 34.0–46.6)
Hemoglobin: 13.3 g/dL (ref 11.1–15.9)
Immature Grans (Abs): 0 x10E3/uL (ref 0.0–0.1)
Immature Granulocytes: 0 %
Lymphocytes Absolute: 1.9 x10E3/uL (ref 0.7–3.1)
Lymphs: 18 %
MCH: 30.9 pg (ref 26.6–33.0)
MCHC: 32 g/dL (ref 31.5–35.7)
MCV: 97 fL (ref 79–97)
Monocytes Absolute: 0.8 x10E3/uL (ref 0.1–0.9)
Monocytes: 8 %
Neutrophils Absolute: 7.3 x10E3/uL — ABNORMAL HIGH (ref 1.4–7.0)
Neutrophils: 72 %
Platelets: 324 x10E3/uL (ref 150–450)
RBC: 4.3 x10E6/uL (ref 3.77–5.28)
RDW: 13.2 % (ref 11.7–15.4)
WBC: 10.3 x10E3/uL (ref 3.4–10.8)

## 2024-02-05 LAB — CMP14+EGFR
ALT: 8 IU/L (ref 0–32)
AST: 11 IU/L (ref 0–40)
Albumin: 4 g/dL (ref 3.9–4.9)
Alkaline Phosphatase: 79 IU/L (ref 49–135)
BUN/Creatinine Ratio: 13 (ref 12–28)
BUN: 11 mg/dL (ref 8–27)
Bilirubin Total: <0.2 mg/dL (ref 0.0–1.2)
CO2: 27 mmol/L (ref 20–29)
Calcium: 8.9 mg/dL (ref 8.7–10.3)
Chloride: 102 mmol/L (ref 96–106)
Creatinine, Ser: 0.83 mg/dL (ref 0.57–1.00)
Globulin, Total: 2.4 g/dL (ref 1.5–4.5)
Glucose: 88 mg/dL (ref 70–99)
Potassium: 3.5 mmol/L (ref 3.5–5.2)
Sodium: 143 mmol/L (ref 134–144)
Total Protein: 6.4 g/dL (ref 6.0–8.5)
eGFR: 76 mL/min/1.73 (ref >59)

## 2024-02-05 LAB — LIPID PANEL
Cholesterol, Total: 153 mg/dL (ref 100–199)
HDL: 48 mg/dL (ref >39)
LDL CALC COMMENT:: 3.2 ratio (ref 0.0–4.4)
LDL Chol Calc (NIH): 84 mg/dL (ref 0–99)
Triglycerides: 116 mg/dL (ref 0–149)
VLDL Cholesterol Cal: 21 mg/dL (ref 5–40)

## 2024-02-09 ENCOUNTER — Ambulatory Visit: Payer: Self-pay | Admitting: Family Medicine

## 2024-02-09 NOTE — Progress Notes (Signed)
Hello [place]ifa, ? ?Your lab result is normal and/or stable.Some minor variations that are not significant are commonly marked abnormal, but do not represent any medical problem for you. ? ?Best regards, ?Bobie Caris, M.D.

## 2024-02-10 ENCOUNTER — Ambulatory Visit (HOSPITAL_COMMUNITY): Admission: RE | Admit: 2024-02-10 | Discharge: 2024-02-10 | Attending: Medical

## 2024-02-10 DIAGNOSIS — I350 Nonrheumatic aortic (valve) stenosis: Secondary | ICD-10-CM | POA: Insufficient documentation

## 2024-02-10 LAB — ECHOCARDIOGRAM COMPLETE
AR max vel: 0.65 cm2
AV Area VTI: 0.71 cm2
AV Area mean vel: 0.75 cm2
AV Mean grad: 29 mmHg
AV Peak grad: 51.4 mmHg
Ao pk vel: 3.58 m/s
Area-P 1/2: 2.22 cm2
S' Lateral: 2.7 cm

## 2024-02-10 NOTE — Progress Notes (Signed)
*  PRELIMINARY RESULTS* Echocardiogram 2D Echocardiogram has been performed.  [name] 02/10/2024, 10:25 AM

## 2024-02-11 ENCOUNTER — Encounter: Payer: Self-pay | Admitting: Pulmonary Disease

## 2024-02-11 ENCOUNTER — Ambulatory Visit: Payer: Self-pay | Admitting: Medical

## 2024-02-11 ENCOUNTER — Ambulatory Visit (INDEPENDENT_AMBULATORY_CARE_PROVIDER_SITE_OTHER): Admitting: Pulmonary Disease

## 2024-02-11 VITALS — BP 139/76 | HR 83 | Temp 98.0°F | Ht 65.0 in | Wt 139.0 lb

## 2024-02-11 DIAGNOSIS — J449 Chronic obstructive pulmonary disease, unspecified: Secondary | ICD-10-CM | POA: Diagnosis not present

## 2024-02-11 DIAGNOSIS — R911 Solitary pulmonary nodule: Secondary | ICD-10-CM

## 2024-02-11 DIAGNOSIS — C3411 Malignant neoplasm of upper lobe, right bronchus or lung: Secondary | ICD-10-CM | POA: Diagnosis not present

## 2024-02-11 DIAGNOSIS — L03116 Cellulitis of left lower limb: Secondary | ICD-10-CM | POA: Diagnosis not present

## 2024-02-11 DIAGNOSIS — F17210 Nicotine dependence, cigarettes, uncomplicated: Secondary | ICD-10-CM

## 2024-02-11 MED ORDER — AMOXICILLIN-POT CLAVULANATE 875-125 MG PO TABS: 1.0000 | ORAL_TABLET | Freq: Two times a day (BID) | ORAL | 0 refills | Status: AC

## 2024-02-11 NOTE — Patient Instructions (Signed)
°  VISIT SUMMARY: Today, we discussed your worsening breathing difficulties, lung cancer treatment, and other health concerns.  YOUR PLAN: CHRONIC OBSTRUCTIVE PULMONARY DISEASE (COPD): Your COPD is causing significant shortness of breath, especially in the mornings, and your current medication, Symbicort , is not providing enough relief. -We have started you on Otovair nebulizer to help improve your breathing. -We discussed smoking cessation options, including Chantix  or nicotine  patches, to help you quit smoking.  LUNG ADENOCARCINOMA: You have stage IA lung adenocarcinoma in the right upper lobe, and you have undergone radiation therapy. -We are waiting for follow-up imaging results to assess the effectiveness of the radiation treatment. -Continue to follow up with your oncologist.  DOG BITE WITH CELLULITIS: You have a dog bite on your right calf with signs of infection. -If the condition worsens, please visit the hospital for IV antibiotics.

## 2024-02-11 NOTE — Progress Notes (Signed)
 [name]    [id number]    08-11-1955  Primary Care Physician:Stacks, Butler, MD  Referring Physician: Ruthell Lauraine FALCON, NP [address] [place],  [place] [address]  Chief complaint: Follow up for COPD, stage Ia lung adenocarcinoma  HPI: Active smoker with history of coronary artery disease, COPD Here for follow-up of COPD.  Maintained on Symbicort  and Spiriva  dyspnea with wheezing.    Evaluated for hypersensitivity pneumonitis in 2020 due to ongoing mold exposure.  CT is not typical for hypersensitivity ILD and HP panel is negative Evaluated in the ED in January 2021 for pleuritic right chest pain with negative CTA, negative cardiac work-up  Interim history:  Discussed the use of AI scribe software for clinical note transcription with the patient, who gave verbal consent to proceed.  History of Present Illness [name] is a 68 year old female with COPD and stage 1A lung adenocarcinoma who presents with worsening breathing difficulties.  Dyspnea and respiratory symptoms - Significant shortness of breath, worse in the mornings - Minimal exertion, such as walking to the bathroom, provokes symptoms - Uses Symbicort  twice daily and Spiriva  once daily - Symbicort  perceived as ineffective - Exertional capacity limited by respiratory symptoms  Lung adenocarcinoma - Diagnosed in October 2025 with stage 1A non-small cell lung adenocarcinoma - Two lesions located in the right upper and middle lobes - Received SBRT - Declined surgical intervention - Follow-up scan pending - Oncology follow-up scheduled for next week  Back pain and limited mobility - Severe back pain restricts ability to stand or walk for more than five minutes - Limited mobility further reduces exertional capacity and contributes to breathing limitations  Relevant pulmonary history Pets: Has 2 dogs, no birds, farm animals Occupation: Used to work in a radio broadcast assistant.  Currently on  disability Exposures: Reports living in a home with significant mold exposure for the past 10 months. Smoking history: 50 pack year smoker.  Continues to smoke 1 pack/day Travel history: No significant travel history Relevant family history: Sister has COPD.  Outpatient Encounter Medications as of 02/11/2024  Medication Sig   acetaminophen  (TYLENOL ) 500 MG tablet Take 1,000 mg by mouth every 6 (six) hours as needed for moderate pain.   albuterol  (VENTOLIN  HFA) 108 (90 Base) MCG/ACT inhaler Inhale 1-2 puffs into the lungs every 6 (six) hours as needed for wheezing or shortness of breath.   alendronate  (FOSAMAX ) 70 MG tablet TAKE 1 TABLET BY MOUTH EVERY 7 DAYS with a full GLASS of water  ON an EMPTY stomach. DO not lay down FOR AT least 2 HOURS   amLODipine  (NORVASC ) 10 MG tablet Take 1 tablet (10 mg total) by mouth daily.   atorvastatin  (LIPITOR) 80 MG tablet Take 1 tablet (80 mg total) by mouth daily.   budesonide -formoterol  (SYMBICORT ) 160-4.5 MCG/ACT inhaler Inhale 2 puffs into the lungs 2 (two) times daily.   carbidopa -levodopa  (SINEMET  IR) 10-100 MG tablet Take 1 tablet by mouth 3 (three) times daily. FOR parkinsons   clonazePAM  (KLONOPIN ) 1 MG tablet Take 1 tablet (1 mg total) by mouth 3 (three) times daily as needed. for anxiety   dexlansoprazole  (DEXILANT ) 60 MG capsule TAKE ONE CAPSULE BY MOUTH DAILY ON AN EMPTY STOMACH   DULoxetine  (CYMBALTA ) 60 MG capsule Take 1 capsule (60 mg total) by mouth 2 (two) times daily.   ezetimibe  (ZETIA ) 10 MG tablet Take 1 tablet (10 mg total) by mouth daily.   gabapentin  (NEURONTIN )  600 MG tablet Take 2 tablets (1,200 mg total) by mouth 3 (three) times daily.   ipratropium-albuterol  (DUONEB) 0.5-2.5 (3) MG/3ML SOLN Take 3 mLs by nebulization every 6 (six) hours as needed.   levocetirizine (XYZAL ) 5 MG tablet Take 1 tablet (5 mg total) by mouth every evening.   linaclotide  (LINZESS ) 145 MCG CAPS capsule Take 1 capsule (145 mcg total) by mouth daily before  breakfast.   lisinopril  (ZESTRIL ) 40 MG tablet Take 1 tablet (40 mg total) by mouth daily.   meclizine  (ANTIVERT ) 25 MG tablet TAKE 1 TABLET BY MOUTH THREE TIMES DAILY AS NEEDED FOR dizziness.   meloxicam (MOBIC) 15 MG tablet Take 15 mg by mouth daily.   methocarbamol  (ROBAXIN ) 750 MG tablet Take 750 mg by mouth every 6 (six) hours as needed.   naproxen sodium (ALEVE) 220 MG tablet Take 220 mg by mouth 2 (two) times daily as needed (pain).   predniSONE  (DELTASONE ) 10 MG tablet Take 5 daily for 3 days followed by 4,3,2 and 1 for 3 days each.   rOPINIRole  (REQUIP ) 3 MG tablet Take 1 tablet (3 mg total) by mouth at bedtime.   Tiotropium Bromide  (SPIRIVA  RESPIMAT) 2.5 MCG/ACT AERS Inhale 2 puffs into the lungs daily.   Vitamin D , Ergocalciferol , (DRISDOL ) 1.25 MG (50000 UNIT) CAPS capsule TAKE 1 CAPSULE BY MOUTH WEEKLY   No facility-administered encounter medications on file as of 02/11/2024.    Vitals:   02/11/24 0854  BP: 139/76  Pulse: 83  Temp: 98 F (36.7 C)  Height: 5' 5 (1.651 m)  Weight: 139 lb (63 kg)  SpO2: 97%  TempSrc: Oral  BMI (Calculated): 23.13     Physical Exam GEN: No acute distress. CV: Regular rate and rhythm, no murmurs. LUNGS: Clear to auscultation bilaterally, normal respiratory effort. SKIN JOINTS: Warm and dry, no rash. Lesion on right calf, red, consistent with dog bite.    Data Reviewed: Imaging: CT chest screening 01/07/2018- small 3.4 mm right lower lobe lung nodule.  Mild emphysematous changes. CTA 03/06/2019-no pulmonary embolism or lung abnormality.  CT chest screening 03/22/2020- slightly smaller right lower lobe lung nodule measuring 2.6 mm, advanced atherosclerosis and aortic wall calcification. CT screening 03/24/2022-increase in right upper lobe groundglass nodule to 10 mm PET scan 04/03/2022-no significant FDG uptake Screening CT chest 10/03/2022-stable pulmonary nodule in the right upper lobe.  Emphysema. Screening CT chest 10/14/2023-part solid  lung nodule in the right upper lobe PET scan 11/19/2023-right upper lobe 15 mm nodule with minimal FDG activity.  8 mm nodule in the right middle lobe with mild uptake. I have reviewed the images personally  PFTs: 12/05/2019 FVC 2.47 [76%], FEV1 1.58 [63%], F/F 64, TLC 6.21 [121%], DLCO 18.45 [90%] Moderate obstruction  Labs: CBC 05/25/2017-WBC 10.3, eos 1%, absolute eosinophil count 103 CBC 08/22/2019-WBC 8.9, eos 1%, 30 significant count 89 IgE 05/27/2018-7 Hypersensitivity panel 05/27/2018-negative  Assessment & Plan Chronic obstructive pulmonary disease Moderate COPD lung function reduced to 40-50% of capacity, likely due to long-term smoking. Current treatment includes Symbicort  and Spiriva , but she reports limited efficacy of Symbicort . Experiences significant dyspnea, especially in the mornings. Declined pulmonary rehabilitation due to physical limitations and personal circumstances. - Initiated Otovayre nebulizer to improve breathing - Discussed smoking cessation options including Chantix  or nicotine  patches  Lung adenocarcinoma, right upper lobe, stage IA Stage IA lung adenocarcinoma in the right upper lobe, diagnosed in October. Underwent radiation therapy for the right upper lobe lesion. Follow-up imaging is pending to assess treatment efficacy. Chemotherapy is  not indicated per oncologist's recommendation. - Await follow-up imaging results to assess treatment efficacy - Continue follow-up with oncologist  Dog bite with cellulitis, right calf Dog bite on the right calf with signs of cellulitis, including redness and previous swelling. - Prescribed Augmentin  for 7 days.  Advised hospital visit for IV antibiotics if condition worsens   Plan/Recommendations: - Continue Symbicort , Spiriva  - Start Ohtuvayre  - Augmentin   I personally spent a total of 40 minutes in the care of the patient today including preparing to see the patient, getting/reviewing separately obtained history,  documenting clinical information in the EHR, independently interpreting results, communicating results, and coordinating care.   Lonna Coder MD [facility] 02/11/2024, 9:25 AM  CC: Ruthell Lauraine FALCON, NP

## 2024-02-12 ENCOUNTER — Other Ambulatory Visit: Payer: Self-pay | Admitting: Family Medicine

## 2024-02-15 ENCOUNTER — Telehealth: Payer: Self-pay | Admitting: Family Medicine

## 2024-02-15 NOTE — Telephone Encounter (Unsigned)
 Copied from CRM #[id number]. Topic: Clinical - Medication Prior Auth >> Feb 15, 2024  1:59 PM Diannia H wrote: Reason for CRM: Patient called and stated that the provider needs to send in a prior auth so the patients can get her wipes. The provider needs to include why the patient needs them and she states its because she can't control her bowels. Could you assist? Patients callback number is [phone].

## 2024-02-16 ENCOUNTER — Telehealth: Payer: Self-pay

## 2024-02-16 NOTE — Telephone Encounter (Signed)
 Received Ohtuvayre  new start paperwork. Completed form and faxed with clinicals and insurance card copy to [facility]   Phone#: [phone] Fax#: [phone]

## 2024-02-16 NOTE — Telephone Encounter (Signed)
Please forward to prior auth team

## 2024-02-17 ENCOUNTER — Other Ambulatory Visit (HOSPITAL_COMMUNITY): Payer: Self-pay

## 2024-02-17 ENCOUNTER — Inpatient Hospital Stay: Attending: Oncology | Admitting: Oncology

## 2024-02-17 NOTE — Telephone Encounter (Signed)
 Received fax from [employer] confirming receipt of enrollment form.   ID [id number]

## 2024-02-17 NOTE — Telephone Encounter (Signed)
 Good morning [name], we do not submit PA's for wipes, only prescription medications, they may be covered under her medical from a medical supply

## 2024-02-17 NOTE — Telephone Encounter (Signed)
 Instructed pt that [employer] needs to fax us  their forms to sign. That we do not do a PA per say, that there is a specific MCD CMN PA form that the PCP signs from the DME company. She expresses understanding and will get in touch with the company.

## 2024-02-18 NOTE — Telephone Encounter (Signed)
 Received fax from [facility] that Rx was received.

## 2024-02-18 NOTE — Telephone Encounter (Signed)
 Received fax from [employer] with summary of benefits. Referral form for Ohtuvayre  received. Rx will be triaged to [facility] (phone: [phone]). Once benefits investigation completed, pharmacy will reach out the patient to schedule shipment. If medication is unaffordable, patient will need to express financial hardship to be referred back to [facility] for patient assistance program pre-screening.   Patient ID: [id number] Pharmacy phone: [facility] (phone: [phone]) [facility] Phone#: [phone]

## 2024-02-19 NOTE — Telephone Encounter (Signed)
 Paperwork received for incontinence supplies. Letter of Medical Necessity written as well and placed on PCP's desk

## 2024-02-26 ENCOUNTER — Other Ambulatory Visit (HOSPITAL_COMMUNITY): Payer: Self-pay | Admitting: Psychiatry

## 2024-02-26 DIAGNOSIS — F411 Generalized anxiety disorder: Secondary | ICD-10-CM

## 2024-02-29 ENCOUNTER — Encounter: Payer: Self-pay | Admitting: *Deleted

## 2024-02-29 NOTE — Progress Notes (Signed)
 [name] is here today for follow up post radiation to the lung.  Lung Side: Right  Does the patient complain of any of the following: Pain: 0/10  Shortness of breath w/wo exertion:  yes Cough: phlegm Hemoptysis: No Pain with swallowing: No, horse voice. Swallowing/choking concerns: No Appetite: fair, not drinking supplement drinks Energy Level: No good Post radiation skin Changes: No   Additional comments if applicable: Reports occasional discomfort but that its chronic from when she had fluid remove from lungs nothing new.

## 2024-03-05 ENCOUNTER — Other Ambulatory Visit: Payer: Self-pay | Admitting: Family Medicine

## 2024-03-08 NOTE — Progress Notes (Signed)
 " Patient Care Team: Zollie Lowers, MD as PCP - General (Family Medicine) Alvan, Dorn FALCON, MD as PCP - Cardiology (Cardiology) Avram Lupita BRAVO, MD as Consulting Physician (Gastroenterology) Okey Barnie SAUNDERS, MD as Consulting Physician (Behavioral Health) Livingston Rigg, MD as Consulting Physician (Dermatology) Theophilus Roosevelt, MD as Consulting Physician (Pulmonary Disease) Onesimo Oneil LABOR, MD as Consulting Physician (Orthopedic Surgery) Euna Read, MD as Referring Physician (Neurosurgery) Harrie Agent, MD as Consulting Physician (Ophthalmology) Dr [facility], OD (Optometry) Davonna Siad, MD as Medical Oncologist (Medical Oncology) Celestia Joesph SQUIBB, RN as Oncology Nurse Navigator (Medical Oncology)  Clinic Day:  03/08/2024  Referring physician: Zollie Lowers, MD   CHIEF COMPLAINT:  CC: Non-small cell lung carcinoma    ASSESSMENT & PLAN:   Assessment & Plan: [name]  is a 69 y.o. female with Non small cell lung carcinoma  Assessment and Plan Assessment & Plan Non-small cell lung cancer of the right lung Stage I, post-radiation therapy, requires surveillance for recurrence.  - Ordered CT scan to assess response to radiation therapy. - Scheduled follow-up visit after CT scan to review results and response to radiation. - If imaging is stable, planned surveillance scans every three months.  Chronic pain due not to malignancy Severe, persistent pain refractory to current medications, limiting function, requires further evaluation.  - Referred to pain clinic for further evaluation and management. - Provided instructions to obtain pain clinic referral information at the front desk.  Chronic obstructive pulmonary disease (COPD) Chronic dyspnea and wheezing, inhalers used, symptoms persist.  - Encouraged continued adherence to prescribed inhalers.   The patient understands the plans discussed today and is in agreement with them.  She  knows to contact our office if she develops concerns prior to her next appointment.  40 minutes of total time was spent for this patient encounter, including preparation,face-to-face counseling with the patient and coordination of care, physical exam, and documentation of the encounter.   Siad Davonna, MD  [facility] [place] - A DEPT OF [facility] [address] [place] [address] Dept: [phone] Dept Fax: [phone]   No orders of the defined types were placed in this encounter.    ONCOLOGY HISTORY:   Diagnosis: stage I non small cell lung cancer   -03/24/2022: CT Chest Lung CA screen low dose without CM: Ground-glass nodule of the right upper lobe has doubled in size when compared with prior lung cancer screening CT and is suspicious for primary lung adenocarcinoma. Lung-RADS 4X, highly suspicious.  -04/03/2022:PET scan: Ground-glass 1.0 cm posterior right upper lobe pulmonary nodule demonstrates no significant FDG uptake. No hypermetabolic thoracic adenopathy or distant metastatic disease. Left adrenal adenoma, for which no follow-up imaging is recommended.  -10/03/2022: CT Chest low dose without contrast: Lung-RADS 2, benign appearance or behavior. Continue annual screening with low-dose chest CT without contrast in 12 months. Left adrenal adenoma. Tiny left renal stone.  -07/13/2023: CT Chest low dose without contrast: Lung-RADS 4A, suspicious. Continued progression of mixed density right upper lobe pulmonary nodule.  -10/14/2023: CT Chest low dose without contrast: Part solid nodule in the posterior segment right upper lobe is grossly stable. Lung-RADS 3, probably benign findings.  -11/20/2023: PET scan: 15 mm part solid nodule in the posterior right upper lobe shows minimal FDG activity, similar to prior PET in 2024. This nodule does show progression in size and attenuation since prior earlier studies in 2024,  suspicious for low-grade adenocarcinoma.  8 mm nodular density in the superior right middle lobe shows mild FDG uptake, but remains stable in both size and FDG activity compared to previous exams. Low-grade adenocarcinoma cannot be excluded. No evidence of metastatic disease. Bilateral nephrolithiasis, with mild right hydronephrosis due to 11 mm calculus in the right renal pelvis. Colonic diverticulosis, without radiographic evidence of diverticulitis. -12/07/2023: EBUS with biopsy:  -Right upper lobe FNA: Adenocarcinoma - Right middle lobe brushing: No malignant cells identified -01/25/2024: Completed SBRT (Dr.Squire)  Current Treatment:  Surveillance  INTERVAL HISTORY:   Discussed the use of AI scribe software for clinical note transcription with the patient, who gave verbal consent to proceed.  History of Present Illness [name] is a 69 year old female with stage I non-small cell lung cancer of the right lung, status post recent radiation therapy, who presents for oncology follow-up to assess post-radiation status and pain management.  She recently completed radiation therapy for stage I non-small cell lung cancer of the right lung.  She experiences severe, persistent pain involving her back, side, neck, and shoulder, describing it as constant and significantly limiting her daily activities and energy. She is unable to remain upright for more than ten minutes due to pain and dyspnea. Multiple analgesics, including acetaminophen , ibuprofen , and tramadol, have failed to provide relief; only oxycodone -acetaminophen  has been effective in the past. She previously attended a pain clinic but is unable to return due to insurance limitations.  She has chronic dyspnea attributed to COPD, with symptoms present at all times and exacerbated by cold weather. She uses inhalers as prescribed, which provide partial symptomatic relief. She lives alone and relies on a transportation service for medical  appointments, reporting difficulty with mobility and lack of energy.   I have reviewed the past medical history, past surgical history, social history and family history with the patient and they are unchanged from previous note.  ALLERGIES:  is allergic to fentanyl , lorcet 10-650 [hydrocodone -acetaminophen ], sulfa  antibiotics, tape, tylenol  [acetaminophen ], and wellbutrin  xl [bupropion  hcl er (xl)].  MEDICATIONS:  Current Outpatient Medications  Medication Sig Dispense Refill   acetaminophen  (TYLENOL ) 500 MG tablet Take 1,000 mg by mouth every 6 (six) hours as needed for moderate pain.     albuterol  (VENTOLIN  HFA) 108 (90 Base) MCG/ACT inhaler Inhale 1-2 puffs into the lungs every 6 (six) hours as needed for wheezing or shortness of breath. 8 g 5   alendronate  (FOSAMAX ) 70 MG tablet TAKE 1 TABLET BY MOUTH EVERY 7 DAYS with a full GLASS of water  ON an EMPTY stomach. DO not lay down FOR AT least 2 HOURS 12 tablet 1   amLODipine  (NORVASC ) 10 MG tablet Take 1 tablet (10 mg total) by mouth daily. 90 tablet 3   amoxicillin -clavulanate (AUGMENTIN ) 875-125 MG tablet Take 1 tablet by mouth 2 (two) times daily. 14 tablet 0   atorvastatin  (LIPITOR) 80 MG tablet Take 1 tablet (80 mg total) by mouth daily. 90 tablet 3   budesonide -formoterol  (SYMBICORT ) 160-4.5 MCG/ACT inhaler Inhale 2 puffs into the lungs 2 (two) times daily. 122.4 g 0   carbidopa -levodopa  (SINEMET  IR) 10-100 MG tablet Take 1 tablet by mouth 3 (three) times daily. FOR parkinsons 270 tablet 1   clonazePAM  (KLONOPIN ) 1 MG tablet Take 1 tablet (1 mg total) by mouth 3 (three) times daily as needed. for anxiety 90 tablet 2   dexlansoprazole  (DEXILANT ) 60 MG capsule TAKE ONE CAPSULE BY MOUTH DAILY ON AN EMPTY STOMACH 30 capsule 5   DULoxetine  (CYMBALTA ) 60 MG capsule Take  1 capsule (60 mg total) by mouth 2 (two) times daily. 180 capsule 3   ezetimibe  (ZETIA ) 10 MG tablet Take 1 tablet (10 mg total) by mouth daily. 90 tablet 3   gabapentin   (NEURONTIN ) 600 MG tablet Take 2 tablets (1,200 mg total) by mouth 3 (three) times daily. 540 tablet 1   ipratropium-albuterol  (DUONEB) 0.5-2.5 (3) MG/3ML SOLN Take 3 mLs by nebulization every 6 (six) hours as needed. 360 mL 5   levocetirizine (XYZAL ) 5 MG tablet Take 1 tablet (5 mg total) by mouth every evening. 90 tablet 3   linaclotide  (LINZESS ) 145 MCG CAPS capsule Take 1 capsule (145 mcg total) by mouth daily before breakfast. 30 capsule 5   lisinopril  (ZESTRIL ) 40 MG tablet Take 1 tablet (40 mg total) by mouth daily. 90 tablet 3   meclizine  (ANTIVERT ) 25 MG tablet TAKE 1 TABLET BY MOUTH THREE TIMES DAILY AS NEEDED FOR dizziness. 60 tablet 2   meloxicam (MOBIC) 15 MG tablet Take 15 mg by mouth daily.     methocarbamol  (ROBAXIN ) 750 MG tablet Take 750 mg by mouth every 6 (six) hours as needed.     naproxen sodium (ALEVE) 220 MG tablet Take 220 mg by mouth 2 (two) times daily as needed (pain).     predniSONE  (DELTASONE ) 10 MG tablet Take 5 daily for 3 days followed by 4,3,2 and 1 for 3 days each. 45 tablet 0   rOPINIRole  (REQUIP ) 3 MG tablet Take 1 tablet (3 mg total) by mouth at bedtime. 90 tablet 0   Tiotropium Bromide  (SPIRIVA  RESPIMAT) 2.5 MCG/ACT AERS Inhale 2 puffs into the lungs daily. 12 g 3   Vitamin D , Ergocalciferol , (DRISDOL ) 1.25 MG (50000 UNIT) CAPS capsule TAKE 1 CAPSULE BY MOUTH WEEKLY 13 capsule 1   No current facility-administered medications for this visit.    VITALS:  There were no vitals taken for this visit.  Wt Readings from Last 3 Encounters:  02/11/24 139 lb (63 kg)  02/04/24 138 lb (62.6 kg)  12/25/23 (P) 135 lb 8 oz (61.5 kg)    There is no height or weight on file to calculate BMI.  Performance status (ECOG): 2 - Symptomatic, <50% confined to bed  PHYSICAL EXAM:   GENERAL:alert, no distress and comfortable SKIN: skin color, texture, turgor are normal, no rashes or significant lesions LYMPH:  no palpable lymphadenopathy in the cervical, axillary or  inguinal LUNGS: clear to auscultation and percussion with normal breathing effort HEART: regular rate & rhythm and no murmurs and no lower extremity edema ABDOMEN:abdomen soft, non-tender and normal bowel sounds Musculoskeletal:no cyanosis of digits and no clubbing  NEURO: alert & oriented x 3 with fluent speech  LABORATORY DATA:  I have reviewed the data as listed     Component Value Date/Time   NA 143 02/04/2024 1201   K 3.5 02/04/2024 1201   CL 102 02/04/2024 1201   CO2 27 02/04/2024 1201   GLUCOSE 88 02/04/2024 1201   GLUCOSE 104 (H) 12/07/2023 0838   BUN 11 02/04/2024 1201   CREATININE 0.83 02/04/2024 1201   CREATININE 0.88 08/30/2012 1317   CALCIUM  8.9 02/04/2024 1201   PROT 6.4 02/04/2024 1201   ALBUMIN 4.0 02/04/2024 1201   AST 11 02/04/2024 1201   ALT 8 02/04/2024 1201   ALKPHOS 79 02/04/2024 1201   BILITOT <0.2 02/04/2024 1201   GFRNONAA >60 12/07/2023 0838   GFRNONAA 72 08/30/2012 1317   GFRAA 99 02/28/2020 1607   GFRAA 83 08/30/2012 1317  Lab Results  Component Value Date   WBC 10.3 02/04/2024   NEUTROABS 7.3 (H) 02/04/2024   HGB 13.3 02/04/2024   HCT 41.5 02/04/2024   MCV 97 02/04/2024   PLT 324 02/04/2024      Chemistry      Component Value Date/Time   NA 143 02/04/2024 1201   K 3.5 02/04/2024 1201   CL 102 02/04/2024 1201   CO2 27 02/04/2024 1201   BUN 11 02/04/2024 1201   CREATININE 0.83 02/04/2024 1201   CREATININE 0.88 08/30/2012 1317      Component Value Date/Time   CALCIUM  8.9 02/04/2024 1201   ALKPHOS 79 02/04/2024 1201   AST 11 02/04/2024 1201   ALT 8 02/04/2024 1201   BILITOT <0.2 02/04/2024 1201        RADIOGRAPHIC STUDIES: I have personally reviewed the radiological images as listed and agreed with the findings in the report.  "

## 2024-03-09 ENCOUNTER — Inpatient Hospital Stay: Attending: Oncology | Admitting: Oncology

## 2024-03-09 VITALS — BP 135/78 | HR 88 | Temp 98.7°F | Resp 18 | Ht 65.0 in | Wt 139.9 lb

## 2024-03-09 DIAGNOSIS — C3491 Malignant neoplasm of unspecified part of right bronchus or lung: Secondary | ICD-10-CM | POA: Diagnosis not present

## 2024-03-09 DIAGNOSIS — G8929 Other chronic pain: Secondary | ICD-10-CM | POA: Diagnosis not present

## 2024-03-10 ENCOUNTER — Ambulatory Visit
Admission: RE | Admit: 2024-03-10 | Discharge: 2024-03-10 | Disposition: A | Discharge status: Home / Self Care | Source: Ambulatory Visit | Attending: Radiation Oncology | Admitting: Radiation Oncology

## 2024-03-10 ENCOUNTER — Encounter: Payer: Self-pay | Admitting: Radiation Oncology

## 2024-03-10 ENCOUNTER — Telehealth (INDEPENDENT_AMBULATORY_CARE_PROVIDER_SITE_OTHER): Admitting: Psychiatry

## 2024-03-10 ENCOUNTER — Encounter (HOSPITAL_COMMUNITY): Payer: Self-pay | Admitting: Psychiatry

## 2024-03-10 DIAGNOSIS — C3411 Malignant neoplasm of upper lobe, right bronchus or lung: Secondary | ICD-10-CM

## 2024-03-10 DIAGNOSIS — F411 Generalized anxiety disorder: Secondary | ICD-10-CM

## 2024-03-10 DIAGNOSIS — F332 Major depressive disorder, recurrent severe without psychotic features: Secondary | ICD-10-CM

## 2024-03-10 MED ORDER — DULOXETINE HCL 60 MG PO CPEP: 60.0000 mg | ORAL_CAPSULE | Freq: Two times a day (BID) | ORAL | 3 refills | Status: AC

## 2024-03-10 MED ORDER — CLONAZEPAM 1 MG PO TABS: 1.0000 mg | ORAL_TABLET | Freq: Three times a day (TID) | ORAL | 2 refills | Status: AC | PRN

## 2024-03-10 NOTE — Progress Notes (Signed)
 Virtual Visit via Telephone Note  I connected with [name] on 03/10/2024 at  3:00 PM EST by telephone and verified that I am speaking with the correct person using two identifiers.  Location: Patient: home Provider: office   I discussed the limitations, risks, security and privacy concerns of performing an evaluation and management service by telephone and the availability of in person appointments. I also discussed with the patient that there may be a patient responsible charge related to this service. The patient expressed understanding and agreed to proceed.      I discussed the assessment and treatment plan with the patient. The patient was provided an opportunity to ask questions and all were answered. The patient agreed with the plan and demonstrated an understanding of the instructions.   The patient was advised to call back or seek an in-person evaluation if the symptoms worsen or if the condition fails to improve as anticipated.  I provided 20 minutes of non-face-to-face time during this encounter.   Barnie Gull, MD  [facility] MD/PA/NP OP Progress Note  03/10/2024 3:23 PM [name]  MRN:  [id number]  Chief Complaint:  Chief Complaint  Patient presents with   Anxiety   Depression   Follow-up   HPI: This patient is a 70 year old white female who is widowed and lives alone in [place].  She is on disability.  The patient returns for follow-up after 3 months regarding her major depression and generalized anxiety disorder.  Last time she had had a lung scan that showed adenocarcinoma.  Since then she has had radiation treatment although she finished this in November.  She still feels very tired and worn out.  Unfortunately she continues to smoke 1 pack/day.  On top of this she states that her 49 year old son may have colon cancer.  She is very worried about this as well.  His wife also recently got fired from her job and they are having financial issues.  All of this is been  quite stressful.  Nevertheless she does think the medication for depression and anxiety have helped to some degree. Visit Diagnosis:    ICD-10-CM   1. Severe episode of recurrent major depressive disorder, without psychotic features (HCC)  F33.2 DULoxetine  (CYMBALTA ) 60 MG capsule    2. GAD (generalized anxiety disorder)  F41.1 clonazePAM  (KLONOPIN ) 1 MG tablet      Past Psychiatric History: none  Past Medical History:  Past Medical History:  Diagnosis Date   Allergy    Arthritis    Atypical mole 06/05/2003   slight-mod-Left scapula (WS)   Atypical nevus 07/26/2003   persist. dysp- Left scapula (WS)   Basal cell carcinoma 06/05/2003   RIght chest (CX35FU)   Basal cell carcinoma 05/22/2004   superificial- RIght upper back- (CX35FU)   Basal cell carcinoma 11/27/2004   beside R nostril-(MOHS), below right outer nose (MOHS)   Basal cell carcinoma 11/08/2009   left post shoulder -(txpbx)   Basal cell carcinoma 05/24/2012   ulcerated- Left post shoulder- (CX35FU), ulcerated-mid forehead (EXC )   Basal cell carcinoma 06/24/2012   superificial- Right shoulder - (txpbx)    Basal cell carcinoma 07/29/2012    sclerosis- mid forehead (MOHS)   Basal cell carcinoma 06/06/2019   nod-right anterior neck (CX35FU)   Cataract    Closed compression fracture of L5 vertebra (HCC)    Constipation    uses OTC meds with help   COPD (chronic obstructive pulmonary disease) (HCC)    Coronary artery disease  minimal nonobstructive    COVID    Depression    GERD (gastroesophageal reflux disease)    Heart murmur    Moderate AS 12/11/2022 TTE   History of kidney stones    Hx of adenomatous polyp of colon 10/03/2014   Hypercholesteremia    Hypertension    Panic attacks    Parkinson disease (HCC)    Perimenopausal    Skin cancer    Squamous cell carcinoma of skin 06/05/2003   Right [place](CX35FU)   Squamous cell carcinoma of skin 05/22/2004   in situ- Left upperarm (CX35FU)   Squamous  cell carcinoma of skin 11/27/2004   in situ- above Left elbow (CX35FU)   Squamous cell carcinoma of skin 03/04/2006   in situ- Left sideburn (CX35FU)   Squamous cell carcinoma of skin 11/26/2006   in situ- mid brow (Cx35FU)   Squamous cell carcinoma of skin 05/24/2012   in situ- Left chest- (CX35FU)   Squamous cell carcinoma of skin 04/05/2013   in situ- Left nose (Cx35FU)   Squamous cell carcinoma of skin 08/17/2013   well diff-above Left eye (txpbx)   Squamous cell carcinoma of skin 07/30/2016   in situ- right chest sup (Cx35FU)   Squamous cell carcinoma of skin 05/12/2017   in situ- RIght shoulder (CX35FU), in situ- Left forehead (CX35FU)   Squamous cell carcinoma of skin 11/23/2018   in situ- Right mid shin, inner- (MOHS)   Tobacco user     Past Surgical History:  Procedure Laterality Date   ABDOMINAL HYSTERECTOMY     BACK SURGERY     Spinal Fusion   CATARACT EXTRACTION W/PHACO Right 03/01/2021   Procedure: CATARACT EXTRACTION PHACO AND INTRAOCULAR LENS PLACEMENT (IOC);  Surgeon: Harrie Agent, MD;  Location: [facility];  Service: Ophthalmology;  Laterality: Right;  CDE: 7.31   CATARACT EXTRACTION W/PHACO Left 05/16/2021   Procedure: CATARACT EXTRACTION PHACO AND INTRAOCULAR LENS PLACEMENT (IOC);  Surgeon: Harrie Agent, MD;  Location: [facility];  Service: Ophthalmology;  Laterality: Left;  CDE 9.41    COLONOSCOPY     IR VERTEBROPLASTY CERV/THOR BX INC UNI/BIL INC/INJECT/IMAGING  04/16/2021   Right foot toe surgery     UPPER GASTROINTESTINAL ENDOSCOPY     VIDEO BRONCHOSCOPY WITH ENDOBRONCHIAL NAVIGATION Right 12/07/2023   Procedure: VIDEO BRONCHOSCOPY WITH ENDOBRONCHIAL NAVIGATION;  Surgeon: Shelah Lamar RAMAN, MD;  Location: [facility];  Service: Pulmonary;  Laterality: Right;   vocal cord     polyp removal    Family Psychiatric History: See below  Family History:  Family History  Problem Relation Age of Onset   Heart disease Mother    Heart attack Mother    Heart attack  Father    Colon polyps Sister    Esophageal cancer Sister    Anxiety disorder Sister    Depression Sister    Colon polyps Sister    Heart attack Brother    Colon cancer Brother 29       died at age 16   Anxiety disorder Brother    Depression Brother    Alcohol abuse Brother    Colon polyps Brother    Colon polyps Brother    Drug abuse Son    Breast cancer Niece    Rectal cancer Neg Hx    Stomach cancer Neg Hx     Social History:  Social History   Socioeconomic History   Marital status: Single    Spouse name: Not on file   Number of children: 2  Years of education: Not on file   Highest education level: 10th grade  Occupational History   Occupation: Conservation Officer, [employer]    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   Sexual activity: Not on file  Other Topics Concern   Not on file  Social History Narrative   Retired/ lives alone in one level home   Son and daughter in law that live next door and help her out a lot         Social Drivers of Health   Tobacco Use: High Risk (03/10/2024)   Patient History    Smoking Tobacco Use: Every Day    Smokeless Tobacco Use: Never    Passive Exposure: Not on file  Financial Resource Strain: High Risk (08/24/2023)   Overall Financial Resource Strain (CARDIA)    Difficulty of Paying Living Expenses: Hard  Food Insecurity: Food Insecurity Present (12/25/2023)   Epic    Worried About Programme Researcher, Broadcasting/film/video in the Last Year:  Sometimes true    Ran Out of Food in the Last Year: Sometimes true  Transportation Needs: No Transportation Needs (12/24/2023)   Epic    Lack of Transportation (Medical): No    Lack of Transportation (Non-Medical): No  Recent Concern: Transportation Needs - Unmet Transportation Needs (12/21/2023)   Epic    Lack of Transportation (Medical): Yes    Lack of Transportation (Non-Medical): Yes  Physical Activity: Inactive (08/24/2023)   Exercise Vital Sign    Days of Exercise per Week: 0 days    Minutes of Exercise per Session: Not on file  Stress: Stress Concern Present (08/24/2023)   [employer] of Occupational Health - Occupational Stress Questionnaire    Feeling of Stress: Rather much  Social Connections: Socially Isolated (08/24/2023)   Social Connection and Isolation Panel    Frequency of Communication with Friends and Family: Twice a week    Frequency of Social Gatherings with Friends and Family: Twice a week    Attends Religious Services: Never    Database Administrator or Organizations: No    Attends Banker Meetings: Never    Marital Status: Widowed  Depression (PHQ2-9): Medium Risk (03/09/2024)   Depression (PHQ2-9)    PHQ-2 Score: 7  Alcohol Screen: Low Risk (08/24/2023)   Alcohol Screen    Last Alcohol Screening Score (AUDIT): 0  Housing: High Risk (12/21/2023)   Epic    Unable to Pay for Housing in the Last Year: Yes    Number of Times Moved in the Last Year: 0    Homeless in the Last Year: No  Utilities: At Risk (12/24/2023)   Epic    Threatened with loss of utilities: Yes  Health Literacy: Inadequate Health Literacy (12/21/2023)   B1300 Health Literacy    Frequency of need for help with medical instructions: Sometimes  Allergies: Allergies[1]  Metabolic Disorder Labs: Lab Results  Component Value Date   HGBA1C 5.9 (H) 07/12/2013   MPG 123 (H) 07/12/2013   No results found for: PROLACTIN Lab Results  Component Value Date   CHOL 153  02/04/2024   TRIG 116 02/04/2024   HDL 48 02/04/2024   CHOLHDL 3.2 02/04/2024   VLDL 36 07/13/2013   LDLCALC 84 02/04/2024   LDLCALC 79 06/23/2023   Lab Results  Component Value Date   TSH 1.820 05/25/2017   TSH 1.080 05/17/2015    Therapeutic Level Labs: No results found for: LITHIUM No results found for: VALPROATE No results found for: CBMZ  Current Medications: Current Outpatient Medications  Medication Sig Dispense Refill   acetaminophen  (TYLENOL ) 500 MG tablet Take 1,000 mg by mouth every 6 (six) hours as needed for moderate pain.     albuterol  (VENTOLIN  HFA) 108 (90 Base) MCG/ACT inhaler Inhale 1-2 puffs into the lungs every 6 (six) hours as needed for wheezing or shortness of breath. 8 g 5   alendronate  (FOSAMAX ) 70 MG tablet TAKE 1 TABLET BY MOUTH EVERY 7 DAYS with a full GLASS of water  ON an EMPTY stomach. DO not lay down FOR AT least 2 HOURS 12 tablet 1   amLODipine  (NORVASC ) 10 MG tablet Take 1 tablet (10 mg total) by mouth daily. 90 tablet 3   amoxicillin -clavulanate (AUGMENTIN ) 875-125 MG tablet Take 1 tablet by mouth 2 (two) times daily. 14 tablet 0   atorvastatin  (LIPITOR) 80 MG tablet Take 1 tablet (80 mg total) by mouth daily. 90 tablet 3   budesonide -formoterol  (SYMBICORT ) 160-4.5 MCG/ACT inhaler Inhale 2 puffs into the lungs 2 (two) times daily. 122.4 g 0   carbidopa -levodopa  (SINEMET  IR) 10-100 MG tablet Take 1 tablet by mouth 3 (three) times daily. FOR parkinsons 270 tablet 1   clonazePAM  (KLONOPIN ) 1 MG tablet Take 1 tablet (1 mg total) by mouth 3 (three) times daily as needed. for anxiety 90 tablet 2   dexlansoprazole  (DEXILANT ) 60 MG capsule TAKE ONE CAPSULE BY MOUTH DAILY ON AN EMPTY STOMACH 30 capsule 5   DULoxetine  (CYMBALTA ) 60 MG capsule Take 1 capsule (60 mg total) by mouth 2 (two) times daily. 180 capsule 3   ezetimibe  (ZETIA ) 10 MG tablet Take 1 tablet (10 mg total) by mouth daily. 90 tablet 3   gabapentin  (NEURONTIN ) 600 MG tablet Take 2  tablets (1,200 mg total) by mouth 3 (three) times daily. 540 tablet 1   ipratropium-albuterol  (DUONEB) 0.5-2.5 (3) MG/3ML SOLN Take 3 mLs by nebulization every 6 (six) hours as needed. 360 mL 5   levocetirizine (XYZAL ) 5 MG tablet Take 1 tablet (5 mg total) by mouth every evening. 90 tablet 3   linaclotide  (LINZESS ) 145 MCG CAPS capsule Take 1 capsule (145 mcg total) by mouth daily before breakfast. 30 capsule 5   lisinopril  (ZESTRIL ) 40 MG tablet Take 1 tablet (40 mg total) by mouth daily. 90 tablet 3   meclizine  (ANTIVERT ) 25 MG tablet TAKE 1 TABLET BY MOUTH THREE TIMES DAILY AS NEEDED FOR dizziness. 60 tablet 2   meloxicam (MOBIC) 15 MG tablet Take 15 mg by mouth daily.     methocarbamol  (ROBAXIN ) 750 MG tablet Take 750 mg by mouth every 6 (six) hours as needed.     naproxen sodium (ALEVE) 220 MG tablet Take 220 mg by mouth 2 (two) times daily as needed (pain).     OHTUVAYRE  3 MG/2.5ML SUSP      predniSONE  (DELTASONE ) 10 MG  tablet Take 5 daily for 3 days followed by 4,3,2 and 1 for 3 days each. 45 tablet 0   rOPINIRole  (REQUIP ) 3 MG tablet Take 1 tablet (3 mg total) by mouth at bedtime. 90 tablet 0   Tiotropium Bromide  (SPIRIVA  RESPIMAT) 2.5 MCG/ACT AERS Inhale 2 puffs into the lungs daily. 12 g 3   Vitamin D , Ergocalciferol , (DRISDOL ) 1.25 MG (50000 UNIT) CAPS capsule TAKE 1 CAPSULE BY MOUTH WEEKLY 13 capsule 1   No current facility-administered medications for this visit.     Musculoskeletal: Strength & Muscle Tone: na Gait & Station: na Patient leans: N/A  Psychiatric Specialty Exam: Review of Systems  Constitutional:  Positive for fatigue.  Respiratory:  Positive for cough and chest tightness.   Psychiatric/Behavioral:  The patient is nervous/anxious.   All other systems reviewed and are negative.   There were no vitals taken for this visit.There is no height or weight on file to calculate BMI.  General Appearance: NA  Eye Contact:  NA  Speech:  Clear and Coherent  Volume:   Normal  Mood:  Anxious  Affect:  NA  Thought Process:  Goal Directed  Orientation:  Full (Time, Place, and Person)  Thought Content: Rumination   Suicidal Thoughts:  No  Homicidal Thoughts:  No  Memory:  Immediate;   Good Recent;   Good Remote;   NA  Judgement:  Good  Insight:  Fair  Psychomotor Activity:  Decreased  Concentration:  Concentration: Good and Attention Span: Good  Recall:  Good  Fund of Knowledge: Good  Language: Good  Akathisia:  No  Handed:  Right  AIMS (if indicated): not done  Assets:  Communication Skills Desire for Improvement Resilience Social Support  ADL's:  Intact  Cognition: WNL  Sleep:  Fair   Screenings: GAD-7    Flowsheet Row Office Visit from 02/04/2024 in [facility] Office Visit from 09/10/2023 in [facility] Office Visit from 08/05/2023 in [facility] Office Visit from 12/17/2022 in [facility] Office Visit from 08/20/2022 in [facility]  Total GAD-7 Score 12 6 8 13 14    PHQ2-9    Flowsheet Row Office Visit from 03/09/2024 in [facility] [place] [place]. [facility] Office Visit from 02/04/2024 in [facility] CONSULT from 12/25/2023 in [facility] Office Visit from 12/21/2023 in [facility] - A Dept Of [place]. [facility] Office Visit from 09/10/2023 in [facility]  PHQ-2 Total Score 2 2 4 5 4   PHQ-9 Total Score 7 7 -- 14 15   Flowsheet Row Office Visit from 03/09/2024 in [facility] [place] - [facility] Dept Of [place]. [facility] Office Visit from 12/21/2023 in [facility] - A Dept Of [place]. [facility] Admission (Discharged) from 12/07/2023 in [facility] ENDOSCOPY  C-SSRS RISK  CATEGORY No Risk No Risk No Risk     Assessment and Plan: This patient is a 69 year old female with a history of major depression and generalized anxiety.  She has struggling with a lot of family issues as well as her own health problems but for the most part her medications have been helpful.  She will continue Cymbalta  60 mg twice daily for major depression and  clonazepam  1 mg 3 times daily for generalized anxiety disorder.  She will return to see me in 3 months  Collaboration of Care: Collaboration of Care: Primary Care Provider AEB notes are shared with PCP through the epic system  Patient/Guardian was advised Release of Information must be obtained prior to any record release in order to collaborate their care with an outside provider. Patient/Guardian was advised if they have not already done so to contact the registration department to sign all necessary forms in order for us  to release information regarding their care.   Consent: Patient/Guardian gives verbal consent for treatment and assignment of benefits for services provided during this visit. Patient/Guardian expressed understanding and agreed to proceed.    Barnie Gull, MD 03/10/2024, 3:23 PM     [1]  Allergies Allergen Reactions   Fentanyl  Other (See Comments)   Lorcet 10-650 [Hydrocodone -Acetaminophen ] Itching   Sulfa  Antibiotics Other (See Comments)    aching   Tape Other (See Comments)    Tear skin. Please use paper tape   Tylenol  [Acetaminophen ] Itching   Wellbutrin  Xl [Bupropion  Hcl Er (Xl)]     Upset stomach /bad taste in mouth

## 2024-03-25 ENCOUNTER — Ambulatory Visit (HOSPITAL_COMMUNITY)
Admission: RE | Admit: 2024-03-25 | Discharge: 2024-03-25 | Disposition: A | Source: Ambulatory Visit | Attending: Oncology

## 2024-03-25 DIAGNOSIS — C3491 Malignant neoplasm of unspecified part of right bronchus or lung: Secondary | ICD-10-CM | POA: Insufficient documentation

## 2024-03-25 LAB — POCT I-STAT CREATININE: Creatinine, Ser: 1.1 mg/dL — ABNORMAL HIGH (ref 0.44–1.00)

## 2024-03-25 MED ORDER — IOHEXOL 300 MG/ML  SOLN
75.0000 mL | Freq: Once | INTRAMUSCULAR | Status: AC | PRN
  Administered 2024-03-25: 75 mL via INTRAVENOUS

## 2024-03-30 ENCOUNTER — Inpatient Hospital Stay: Admitting: Oncology

## 2024-03-30 DIAGNOSIS — G8929 Other chronic pain: Secondary | ICD-10-CM

## 2024-03-30 DIAGNOSIS — C3491 Malignant neoplasm of unspecified part of right bronchus or lung: Secondary | ICD-10-CM

## 2024-03-30 NOTE — Progress Notes (Signed)
 "  Cancer Center at [facility]  Hematology Follow-up Virtual Visit via Telephone Note  Kerry Lowers, MD  I connected with [name]  on 03/30/24 at  9:40 AM EST by telephone and verified that I am speaking with the correct person using two identifiers.  Location: Patient: at home Provider: Office at [facility]  REASON FOR FOLLOW-UP: Stage I Non small cell lung carcinoma  ASSESSMENT & PLAN:  Patient is a 69 y.o. female following for Non small cell lung carcinoma  Non-small cell lung cancer of the right lung Stage I, post-radiation therapy, requires surveillance for recurrence.   - I reviewed the CT scan findings over the phone with her. There is increased solid component of the previously treated nodule. Will repeat a CT scan in 3 months  Return to clinic in 3 months with a CT scan   Chronic pain due not to malignancy Severe, persistent pain refractory to current medications, limiting function, requires further evaluation.   - Referred to pain clinic for further evaluation and management. Pending at this time.    Chronic obstructive pulmonary disease (COPD) Chronic dyspnea and wheezing, inhalers used, symptoms persist.   - Encouraged continued adherence to prescribed inhalers.     Orders Placed This Encounter  Procedures   CT CHEST W CONTRAST    Standing Status:   Future    Expected Date:   06/28/2024    Expiration Date:   03/30/2025    If indicated for the ordered procedure, I authorize the administration of contrast media per Radiology protocol:   Yes    Does the patient have a contrast media/X-ray dye allergy?:   No    Preferred imaging location?:   [facility]    I discussed the assessment and treatment plan with the patient. The patient was provided an opportunity to ask questions and all were answered. The patient agreed with the plan and demonstrated an understanding of the instructions.   The patient was advised to call back or  seek an in-person evaluation if the symptoms worsen or if the condition fails to improve as anticipated.  I provided 20 minutes of non-face-to-face time during this encounter.   Mickiel Dry, MD 1/28/202610:04 AM    SUMMARY OF ONCOLOGY HISTORY: Diagnosis: stage I non small cell lung cancer   -03/24/2022: CT Chest Lung CA screen low dose without CM: Ground-glass nodule of the right upper lobe has doubled in size when compared with prior lung cancer screening CT and is suspicious for primary lung adenocarcinoma. Lung-RADS 4X, highly suspicious.  -04/03/2022:PET scan: Ground-glass 1.0 cm posterior right upper lobe pulmonary nodule demonstrates no significant FDG uptake. No hypermetabolic thoracic adenopathy or distant metastatic disease. Left adrenal adenoma, for which no follow-up imaging is recommended.  -10/03/2022: CT Chest low dose without contrast: Lung-RADS 2, benign appearance or behavior. Continue annual screening with low-dose chest CT without contrast in 12 months. Left adrenal adenoma. Tiny left renal stone.  -07/13/2023: CT Chest low dose without contrast: Lung-RADS 4A, suspicious. Continued progression of mixed density right upper lobe pulmonary nodule.  -10/14/2023: CT Chest low dose without contrast: Part solid nodule in the posterior segment right upper lobe is grossly stable. Lung-RADS 3, probably benign findings.  -11/20/2023: PET scan: 15 mm part solid nodule in the posterior right upper lobe shows minimal FDG activity, similar to prior PET in 2024. This nodule does show progression in size and attenuation since prior earlier studies in 2024, suspicious for low-grade adenocarcinoma.  8 mm nodular density in the superior right middle lobe shows mild FDG uptake, but remains stable in both size and FDG activity compared to previous exams. Low-grade adenocarcinoma cannot be excluded. No evidence of metastatic disease. Bilateral nephrolithiasis, with mild right hydronephrosis due to  11 mm calculus in the right renal pelvis. Colonic diverticulosis, without radiographic evidence of diverticulitis. -12/07/2023: EBUS with biopsy:  -Right upper lobe FNA: Adenocarcinoma - Right middle lobe brushing: No malignant cells identified -01/25/2024: Completed SBRT (Dr.Squire) -03/25/2024: CT scan: Redemonstration of part solid nodule in the right upper lobe measuring 8 x 13 mm. The nodule appears more solid when compared to the prior exam.No new suspicious lung nodule. No lung mass or consolidation. No pleural effusion or pneumothorax.No metastatic disease identified within the chest.    Current Treatment:  Surveillance  INTERVAL HISTORY:  I discussed the limitations, risks, security and privacy concerns of performing an evaluation and management service by telephone and the availability of in person appointments. I also discussed with the patient that there may be a patient responsible charge related to this service. The patient expressed understanding and agreed to proceed.   [name] 69 y.o. female following for Non small cell lung carcinoma s/p SBRT. Patient reports her chronic pain today and that she did not hear back from pain clinic yet. She has no other complaints today. We discussed the CT scan results and that we will repeat in 3 months. Patient is in agreement.   I have reviewed the past medical history, past surgical history, social history and family history with the patient   ALLERGIES:  is allergic to fentanyl , lorcet 10-650 [hydrocodone -acetaminophen ], sulfa  antibiotics, tape, tylenol  [acetaminophen ], and wellbutrin  xl [bupropion  hcl er (xl)].  MEDICATIONS:  Current Outpatient Medications  Medication Sig Dispense Refill   acetaminophen  (TYLENOL ) 500 MG tablet Take 1,000 mg by mouth every 6 (six) hours as needed for moderate pain.     albuterol  (VENTOLIN  HFA) 108 (90 Base) MCG/ACT inhaler Inhale 1-2 puffs into the lungs every 6 (six) hours as needed for wheezing  or shortness of breath. 8 g 5   alendronate  (FOSAMAX ) 70 MG tablet TAKE 1 TABLET BY MOUTH EVERY 7 DAYS with a full GLASS of water  ON an EMPTY stomach. DO not lay down FOR AT least 2 HOURS 12 tablet 1   amLODipine  (NORVASC ) 10 MG tablet Take 1 tablet (10 mg total) by mouth daily. 90 tablet 3   amoxicillin -clavulanate (AUGMENTIN ) 875-125 MG tablet Take 1 tablet by mouth 2 (two) times daily. 14 tablet 0   atorvastatin  (LIPITOR) 80 MG tablet Take 1 tablet (80 mg total) by mouth daily. 90 tablet 3   budesonide -formoterol  (SYMBICORT ) 160-4.5 MCG/ACT inhaler Inhale 2 puffs into the lungs 2 (two) times daily. 122.4 g 0   carbidopa -levodopa  (SINEMET  IR) 10-100 MG tablet Take 1 tablet by mouth 3 (three) times daily. FOR parkinsons 270 tablet 1   clonazePAM  (KLONOPIN ) 1 MG tablet Take 1 tablet (1 mg total) by mouth 3 (three) times daily as needed. for anxiety 90 tablet 2   dexlansoprazole  (DEXILANT ) 60 MG capsule TAKE ONE CAPSULE BY MOUTH DAILY ON AN EMPTY STOMACH 30 capsule 5   DULoxetine  (CYMBALTA ) 60 MG capsule Take 1 capsule (60 mg total) by mouth 2 (two) times daily. 180 capsule 3   ezetimibe  (ZETIA ) 10 MG tablet Take 1 tablet (10 mg total) by mouth daily. 90 tablet 3   gabapentin  (NEURONTIN ) 600 MG tablet Take 2 tablets (1,200 mg total) by mouth  3 (three) times daily. 540 tablet 1   ipratropium-albuterol  (DUONEB) 0.5-2.5 (3) MG/3ML SOLN Take 3 mLs by nebulization every 6 (six) hours as needed. 360 mL 5   levocetirizine (XYZAL ) 5 MG tablet Take 1 tablet (5 mg total) by mouth every evening. 90 tablet 3   linaclotide  (LINZESS ) 145 MCG CAPS capsule Take 1 capsule (145 mcg total) by mouth daily before breakfast. 30 capsule 5   lisinopril  (ZESTRIL ) 40 MG tablet Take 1 tablet (40 mg total) by mouth daily. 90 tablet 3   meclizine  (ANTIVERT ) 25 MG tablet TAKE 1 TABLET BY MOUTH THREE TIMES DAILY AS NEEDED FOR dizziness. 60 tablet 2   meloxicam (MOBIC) 15 MG tablet Take 15 mg by mouth daily.     methocarbamol   (ROBAXIN ) 750 MG tablet Take 750 mg by mouth every 6 (six) hours as needed.     naproxen sodium (ALEVE) 220 MG tablet Take 220 mg by mouth 2 (two) times daily as needed (pain).     OHTUVAYRE  3 MG/2.5ML SUSP      predniSONE  (DELTASONE ) 10 MG tablet Take 5 daily for 3 days followed by 4,3,2 and 1 for 3 days each. 45 tablet 0   rOPINIRole  (REQUIP ) 3 MG tablet Take 1 tablet (3 mg total) by mouth at bedtime. 90 tablet 0   Tiotropium Bromide  (SPIRIVA  RESPIMAT) 2.5 MCG/ACT AERS Inhale 2 puffs into the lungs daily. 12 g 3   Vitamin D , Ergocalciferol , (DRISDOL ) 1.25 MG (50000 UNIT) CAPS capsule TAKE 1 CAPSULE BY MOUTH WEEKLY 13 capsule 1   No current facility-administered medications for this visit.    PHYSICAL EXAMINATION: Deferred for virtual visit  LABORATORY DATA:  I have reviewed the data as listed  Lab Results  Component Value Date   WBC 10.3 02/04/2024   NEUTROABS 7.3 (H) 02/04/2024   HGB 13.3 02/04/2024   HCT 41.5 02/04/2024   MCV 97 02/04/2024   PLT 324 02/04/2024      Component Value Date/Time   NA 143 02/04/2024 1201   K 3.5 02/04/2024 1201   CL 102 02/04/2024 1201   CO2 27 02/04/2024 1201   GLUCOSE 88 02/04/2024 1201   GLUCOSE 104 (H) 12/07/2023 0838   BUN 11 02/04/2024 1201   CREATININE 1.10 (H) 03/25/2024 1215   CREATININE 0.88 08/30/2012 1317   CALCIUM  8.9 02/04/2024 1201   PROT 6.4 02/04/2024 1201   ALBUMIN 4.0 02/04/2024 1201   AST 11 02/04/2024 1201   ALT 8 02/04/2024 1201   ALKPHOS 79 02/04/2024 1201   BILITOT <0.2 02/04/2024 1201   GFRNONAA >60 12/07/2023 0838   GFRNONAA 72 08/30/2012 1317   GFRAA 99 02/28/2020 1607   GFRAA 83 08/30/2012 1317     RADIOGRAPHIC STUDIES: I have personally reviewed the radiological images as listed and agreed with the findings in the report.  CT CHEST W CONTRAST CLINICAL DATA:  Non-small cell lung cancer (NSCLC), monitor. * Tracking Code: BO *  EXAM: CT CHEST WITH CONTRAST  TECHNIQUE: Multidetector CT imaging of  the chest was performed during intravenous contrast administration.  RADIATION DOSE REDUCTION: This exam was performed according to the departmental dose-optimization program which includes automated exposure control, adjustment of the mA and/or kV according to patient size and/or use of iterative reconstruction technique.  CONTRAST:  75mL OMNIPAQUE  IOHEXOL  300 MG/ML  SOLN  COMPARISON:  CT scan chest from 10/14/2023.  FINDINGS: Cardiovascular: Normal cardiac size. No pericardial effusion. No aortic aneurysm. There are coronary artery calcifications, in keeping with coronary artery disease.  There are also mild to moderate peripheral atherosclerotic vascular calcifications of thoracic aorta and its major branches.  Mediastinum/Nodes: Visualized thyroid  gland appears grossly unremarkable. No solid / cystic mediastinal masses. The esophagus is nondistended precluding optimal assessment. No axillary, mediastinal or hilar lymphadenopathy by size criteria.  Lungs/Pleura: The central tracheo-bronchial tree is patent. There is stable area of emphysematous blebs in the middle lobe (series 4, image 71).  Redemonstration of part solid nodule in the right upper lobe measuring 8 x 13 mm (series 4, image 45). The nodule appears more solid when compared to the prior exam. There is also new fiducial marker near the nodule.  There is a stable sub 4 mm subpleural nodule in the right lung lower lobe (series 4, image 61). No new suspicious lung nodule. No mass or consolidation. No pleural effusion or pneumothorax.  Upper Abdomen: There are several simple cysts in visualized bilateral kidneys. There are at least 5, 6 mm or smaller nonobstructing calculi in the left kidney. There is mild fullness in the visualized portion of the right kidney upper pole calyceal system. Remaining visualized upper abdominal viscera within normal limits.  Musculoskeletal: The visualized soft tissues of the chest  wall are grossly unremarkable. No suspicious osseous lesions. Redemonstration of mild anterior wedging deformity of T5 and T6 vertebrae, similar to the prior study. Vertebroplasty changes noted in the T5 vertebral body. There are mild to moderate multilevel degenerative changes in the visualized spine.  IMPRESSION: 1. Redemonstration of part solid nodule in the right upper lobe measuring 8 x 13 mm. The nodule appears more solid when compared to the prior exam. There is also a new fiducial marker near the nodule. No new suspicious lung nodule. No lung mass or consolidation. No pleural effusion or pneumothorax. 2. No metastatic disease identified within the chest. 3. Other nonacute observations (such as emphysematous blebs in the middle lobe, bilateral renal cysts, left renal calculi, anterior wedging deformity of T5 and T6 vertebrae, etc.), As described above.  Aortic Atherosclerosis (ICD10-I70.0) and Emphysema (ICD10-J43.9).  Electronically Signed   By: Ree Molt M.D.   On: 03/25/2024 13:42      "

## 2024-03-30 NOTE — Patient Instructions (Signed)
 [facility] Discharge Instructions   You were seen and examined today by Dr. Davonna.  She reviewed the results of your lab work which are normal/stable.   She reviewed the results of your CT scan which was stable.   We will see you back in 3 months. We will repeat lab work and a CT scan prior to this visit.    Return as scheduled.    Thank you for choosing [facility] at [facility] to provide your oncology and hematology care.  To afford each patient quality time with our provider, please arrive at least 15 minutes before your scheduled appointment time.   If you have a lab appointment with the Cancer Center please come in thru the Main Entrance and check in at the main information desk.  You need to re-schedule your appointment should you arrive 10 or more minutes late.  We strive to give you quality time with our providers, and arriving late affects you and other patients whose appointments are after yours.  Also, if you no show three or more times for appointments you may be dismissed from the clinic at the providers discretion.     Again, thank you for choosing [facility].  Our hope is that these requests will decrease the amount of time that you wait before being seen by our physicians.       _____________________________________________________________  Should you have questions after your visit to [facility], please contact our office at [phone] and follow the prompts.  Our office hours are 8:00 a.m. and 4:30 p.m. Monday - Friday.  Please note that voicemails left after 4:00 p.m. may not be returned until the following business day.  We are closed weekends and major holidays.  You do have access to a nurse 24-7, just call the main number to the clinic [phone] and do not press any options, hold on the line and a nurse will answer the phone.    For prescription refill requests, have your  pharmacy contact our office and allow 72 hours.    Due to Covid, you will need to wear a mask upon entering the hospital. If you do not have a mask, a mask will be given to you at the Main Entrance upon arrival. For doctor visits, patients may have 1 support person age 27 or older with them. For treatment visits, patients can not have anyone with them due to social distancing guidelines and our immunocompromised population.

## 2024-04-08 ENCOUNTER — Ambulatory Visit: Admitting: Cardiology

## 2024-05-03 ENCOUNTER — Ambulatory Visit: Admitting: Radiation Oncology

## 2024-05-03 ENCOUNTER — Ambulatory Visit: Admitting: Radiology

## 2024-06-07 ENCOUNTER — Telehealth (HOSPITAL_COMMUNITY): Admitting: Psychiatry

## 2024-06-21 ENCOUNTER — Ambulatory Visit (HOSPITAL_COMMUNITY)

## 2024-06-21 ENCOUNTER — Inpatient Hospital Stay

## 2024-06-28 ENCOUNTER — Inpatient Hospital Stay: Admitting: Oncology

## 2024-06-29 ENCOUNTER — Ambulatory Visit: Admitting: Cardiology

## 2024-08-15 ENCOUNTER — Ambulatory Visit: Admitting: Pulmonary Disease

## 2024-08-24 ENCOUNTER — Ambulatory Visit: Payer: Self-pay
